# Patient Record
Sex: Female | Born: 1940
Health system: Southern US, Community
[De-identification: ages and names within clinical notes are randomized; demographics above are authoritative.]

## PROBLEM LIST (undated history)

## (undated) ENCOUNTER — Emergency Department (HOSPITAL_BASED_OUTPATIENT_CLINIC_OR_DEPARTMENT_OTHER): Admission: EM | Payer: PPO | Source: Home / Self Care

## (undated) DIAGNOSIS — M8580 Other specified disorders of bone density and structure, unspecified site: Secondary | ICD-10-CM

## (undated) DIAGNOSIS — R600 Localized edema: Secondary | ICD-10-CM

## (undated) DIAGNOSIS — M542 Cervicalgia: Secondary | ICD-10-CM

## (undated) DIAGNOSIS — K7460 Unspecified cirrhosis of liver: Secondary | ICD-10-CM

## (undated) DIAGNOSIS — K7290 Hepatic failure, unspecified without coma: Secondary | ICD-10-CM

## (undated) DIAGNOSIS — T7840XA Allergy, unspecified, initial encounter: Secondary | ICD-10-CM

## (undated) DIAGNOSIS — R7989 Other specified abnormal findings of blood chemistry: Secondary | ICD-10-CM

## (undated) DIAGNOSIS — Z720 Tobacco use: Secondary | ICD-10-CM

## (undated) DIAGNOSIS — N3281 Overactive bladder: Secondary | ICD-10-CM

## (undated) DIAGNOSIS — E782 Mixed hyperlipidemia: Secondary | ICD-10-CM

## (undated) DIAGNOSIS — G629 Polyneuropathy, unspecified: Secondary | ICD-10-CM

## (undated) DIAGNOSIS — E119 Type 2 diabetes mellitus without complications: Secondary | ICD-10-CM

## (undated) DIAGNOSIS — Z794 Long term (current) use of insulin: Secondary | ICD-10-CM

## (undated) DIAGNOSIS — K219 Gastro-esophageal reflux disease without esophagitis: Secondary | ICD-10-CM

## (undated) DIAGNOSIS — H8110 Benign paroxysmal vertigo, unspecified ear: Secondary | ICD-10-CM

## (undated) DIAGNOSIS — F329 Major depressive disorder, single episode, unspecified: Secondary | ICD-10-CM

## (undated) DIAGNOSIS — J441 Chronic obstructive pulmonary disease with (acute) exacerbation: Secondary | ICD-10-CM

## (undated) DIAGNOSIS — I714 Abdominal aortic aneurysm, without rupture: Secondary | ICD-10-CM

## (undated) DIAGNOSIS — L309 Dermatitis, unspecified: Secondary | ICD-10-CM

## (undated) DIAGNOSIS — F419 Anxiety disorder, unspecified: Secondary | ICD-10-CM

## (undated) DIAGNOSIS — F410 Panic disorder [episodic paroxysmal anxiety] without agoraphobia: Secondary | ICD-10-CM

## (undated) DIAGNOSIS — C50919 Malignant neoplasm of unspecified site of unspecified female breast: Secondary | ICD-10-CM

## (undated) DIAGNOSIS — J9601 Acute respiratory failure with hypoxia: Secondary | ICD-10-CM

## (undated) DIAGNOSIS — K7581 Nonalcoholic steatohepatitis (NASH): Secondary | ICD-10-CM

## (undated) DIAGNOSIS — E1165 Type 2 diabetes mellitus with hyperglycemia: Secondary | ICD-10-CM

## (undated) DIAGNOSIS — F32A Depression, unspecified: Secondary | ICD-10-CM

## (undated) DIAGNOSIS — J9610 Chronic respiratory failure, unspecified whether with hypoxia or hypercapnia: Secondary | ICD-10-CM

## (undated) DIAGNOSIS — Z923 Personal history of irradiation: Secondary | ICD-10-CM

## (undated) DIAGNOSIS — M1711 Unilateral primary osteoarthritis, right knee: Secondary | ICD-10-CM

## (undated) DIAGNOSIS — Z0000 Encounter for general adult medical examination without abnormal findings: Principal | ICD-10-CM

## (undated) DIAGNOSIS — W19XXXA Unspecified fall, initial encounter: Secondary | ICD-10-CM

## (undated) DIAGNOSIS — J449 Chronic obstructive pulmonary disease, unspecified: Secondary | ICD-10-CM

## (undated) DIAGNOSIS — E785 Hyperlipidemia, unspecified: Secondary | ICD-10-CM

## (undated) DIAGNOSIS — D696 Thrombocytopenia, unspecified: Secondary | ICD-10-CM

## (undated) DIAGNOSIS — C801 Malignant (primary) neoplasm, unspecified: Secondary | ICD-10-CM

## (undated) DIAGNOSIS — R350 Frequency of micturition: Secondary | ICD-10-CM

## (undated) DIAGNOSIS — M170 Bilateral primary osteoarthritis of knee: Secondary | ICD-10-CM

## (undated) HISTORY — PX: KNEE SURGERY: SHX244

## (undated) HISTORY — DX: Thrombocytopenia, unspecified: D69.6

## (undated) HISTORY — DX: Gastro-esophageal reflux disease without esophagitis: K21.9

## (undated) HISTORY — PX: MANDIBLE FRACTURE SURGERY: SHX706

## (undated) HISTORY — PX: BREAST SURGERY: SHX581

## (undated) HISTORY — DX: Chronic respiratory failure, unspecified whether with hypoxia or hypercapnia: J96.10

## (undated) HISTORY — DX: Malignant neoplasm of unspecified site of unspecified female breast: C50.919

## (undated) HISTORY — DX: Tobacco use: Z72.0

## (undated) HISTORY — PX: CATARACT EXTRACTION: SUR2

## (undated) HISTORY — DX: Type 2 diabetes mellitus with hyperglycemia: Z79.4

## (undated) HISTORY — DX: Cervicalgia: M54.2

## (undated) HISTORY — PX: PILONIDAL CYST EXCISION: SHX744

## (undated) HISTORY — DX: Acute respiratory failure with hypoxia: J96.01

## (undated) HISTORY — DX: Localized edema: R60.0

## (undated) HISTORY — DX: Frequency of micturition: R35.0

## (undated) HISTORY — DX: Abdominal aortic aneurysm, without rupture: I71.4

## (undated) HISTORY — DX: Hyperlipidemia, unspecified: E78.5

## (undated) HISTORY — DX: Chronic obstructive pulmonary disease with (acute) exacerbation: J44.1

## (undated) HISTORY — DX: Unspecified cirrhosis of liver: K74.60

## (undated) HISTORY — DX: Polyneuropathy, unspecified: G62.9

## (undated) HISTORY — DX: Benign paroxysmal vertigo, unspecified ear: H81.10

## (undated) HISTORY — DX: Major depressive disorder, single episode, unspecified: F32.9

## (undated) HISTORY — DX: Allergy, unspecified, initial encounter: T78.40XA

## (undated) HISTORY — DX: Dermatitis, unspecified: L30.9

## (undated) HISTORY — DX: Other specified disorders of bone density and structure, unspecified site: M85.80

## (undated) HISTORY — DX: Overactive bladder: N32.81

## (undated) HISTORY — PX: MASTECTOMY: SHX3

## (undated) HISTORY — DX: Unspecified fall, initial encounter: W19.XXXA

## (undated) HISTORY — DX: Mixed hyperlipidemia: E78.2

## (undated) HISTORY — DX: Encounter for general adult medical examination without abnormal findings: Z00.00

## (undated) HISTORY — DX: Unilateral primary osteoarthritis, right knee: M17.11

## (undated) HISTORY — DX: Anxiety disorder, unspecified: F41.9

## (undated) HISTORY — DX: Bilateral primary osteoarthritis of knee: M17.0

## (undated) HISTORY — DX: Nonalcoholic steatohepatitis (NASH): K75.81

## (undated) HISTORY — DX: Hepatic failure, unspecified without coma: K72.90

## (undated) HISTORY — DX: Long term (current) use of insulin: E11.65

## (undated) HISTORY — DX: Chronic obstructive pulmonary disease, unspecified: J44.9

## (undated) HISTORY — DX: Type 2 diabetes mellitus without complications: E11.9

## (undated) HISTORY — PX: TONSILLECTOMY: SUR1361

## (undated) HISTORY — DX: Other specified abnormal findings of blood chemistry: R79.89

## (undated) HISTORY — DX: Malignant (primary) neoplasm, unspecified: C80.1

---

## 1990-07-27 HISTORY — PX: GALLBLADDER SURGERY: SHX652

## 2005-07-27 HISTORY — PX: APPENDECTOMY: SHX54

## 2007-02-10 ENCOUNTER — Inpatient Hospital Stay (HOSPITAL_COMMUNITY): Admission: EM | Admit: 2007-02-10 | Discharge: 2007-02-13 | Payer: Self-pay | Admitting: Emergency Medicine

## 2007-02-10 ENCOUNTER — Encounter (INDEPENDENT_AMBULATORY_CARE_PROVIDER_SITE_OTHER): Payer: Self-pay | Admitting: Surgery

## 2008-03-07 ENCOUNTER — Ambulatory Visit: Payer: Self-pay | Admitting: Hematology & Oncology

## 2008-03-19 LAB — CBC WITH DIFFERENTIAL (CANCER CENTER ONLY)
BASO#: 0.1 10*3/uL (ref 0.0–0.2)
BASO%: 1.3 % (ref 0.0–2.0)
HCT: 45.5 % (ref 34.8–46.6)
HGB: 16 g/dL — ABNORMAL HIGH (ref 11.6–15.9)
LYMPH#: 2.4 10*3/uL (ref 0.9–3.3)
MONO#: 0.4 10*3/uL (ref 0.1–0.9)
NEUT%: 59.4 % (ref 39.6–80.0)
RBC: 4.97 10*6/uL (ref 3.70–5.32)
RDW: 11.4 % (ref 10.5–14.6)
WBC: 7.6 10*3/uL (ref 3.9–10.0)

## 2008-03-19 LAB — COMPREHENSIVE METABOLIC PANEL
AST: 17 U/L (ref 0–37)
Alkaline Phosphatase: 94 U/L (ref 39–117)
Glucose, Bld: 179 mg/dL — ABNORMAL HIGH (ref 70–99)
Potassium: 4.4 mEq/L (ref 3.5–5.3)
Sodium: 138 mEq/L (ref 135–145)
Total Bilirubin: 0.6 mg/dL (ref 0.3–1.2)
Total Protein: 7.4 g/dL (ref 6.0–8.3)

## 2008-05-11 ENCOUNTER — Encounter: Admission: RE | Admit: 2008-05-11 | Discharge: 2008-05-11 | Payer: Self-pay | Admitting: Family Medicine

## 2008-05-18 ENCOUNTER — Encounter (INDEPENDENT_AMBULATORY_CARE_PROVIDER_SITE_OTHER): Payer: Self-pay | Admitting: Diagnostic Radiology

## 2008-05-18 ENCOUNTER — Encounter: Admission: RE | Admit: 2008-05-18 | Discharge: 2008-05-18 | Payer: Self-pay | Admitting: Family Medicine

## 2008-05-25 ENCOUNTER — Encounter: Admission: RE | Admit: 2008-05-25 | Discharge: 2008-05-25 | Payer: Self-pay | Admitting: Family Medicine

## 2008-06-18 ENCOUNTER — Encounter (INDEPENDENT_AMBULATORY_CARE_PROVIDER_SITE_OTHER): Payer: Self-pay | Admitting: General Surgery

## 2008-06-18 ENCOUNTER — Ambulatory Visit (HOSPITAL_COMMUNITY): Admission: RE | Admit: 2008-06-18 | Discharge: 2008-06-19 | Payer: Self-pay | Admitting: General Surgery

## 2008-07-02 ENCOUNTER — Ambulatory Visit: Payer: Self-pay | Admitting: Hematology & Oncology

## 2008-07-10 LAB — COMPREHENSIVE METABOLIC PANEL
ALT: 19 U/L (ref 0–35)
Albumin: 4.3 g/dL (ref 3.5–5.2)
CO2: 27 mEq/L (ref 19–32)
Chloride: 102 mEq/L (ref 96–112)
Glucose, Bld: 136 mg/dL — ABNORMAL HIGH (ref 70–99)
Potassium: 4.6 mEq/L (ref 3.5–5.3)
Sodium: 137 mEq/L (ref 135–145)
Total Bilirubin: 0.7 mg/dL (ref 0.3–1.2)
Total Protein: 7.2 g/dL (ref 6.0–8.3)

## 2008-07-10 LAB — CBC WITH DIFFERENTIAL (CANCER CENTER ONLY)
BASO%: 1.2 % (ref 0.0–2.0)
LYMPH#: 2.5 10*3/uL (ref 0.9–3.3)
MONO#: 0.4 10*3/uL (ref 0.1–0.9)
NEUT#: 3.9 10*3/uL (ref 1.5–6.5)
Platelets: 181 10*3/uL (ref 145–400)
RDW: 11.2 % (ref 10.5–14.6)
WBC: 7.2 10*3/uL (ref 3.9–10.0)

## 2008-07-10 LAB — CANCER ANTIGEN 27.29: CA 27.29: 51 U/mL — ABNORMAL HIGH (ref 0–39)

## 2008-08-10 LAB — CBC WITH DIFFERENTIAL (CANCER CENTER ONLY)
BASO%: 0.8 % (ref 0.0–2.0)
LYMPH%: 37.8 % (ref 14.0–48.0)
MCH: 32.1 pg (ref 26.0–34.0)
MCV: 93 fL (ref 81–101)
MONO%: 6.1 % (ref 0.0–13.0)
NEUT#: 3.5 10*3/uL (ref 1.5–6.5)
Platelets: 171 10*3/uL (ref 145–400)
RDW: 10.9 % (ref 10.5–14.6)
WBC: 6.7 10*3/uL (ref 3.9–10.0)

## 2008-08-10 LAB — COMPREHENSIVE METABOLIC PANEL
Alkaline Phosphatase: 83 U/L (ref 39–117)
BUN: 13 mg/dL (ref 6–23)
CO2: 25 mEq/L (ref 19–32)
Creatinine, Ser: 0.96 mg/dL (ref 0.40–1.20)
Glucose, Bld: 86 mg/dL (ref 70–99)
Sodium: 139 mEq/L (ref 135–145)
Total Bilirubin: 0.7 mg/dL (ref 0.3–1.2)

## 2008-10-15 ENCOUNTER — Ambulatory Visit: Payer: Self-pay | Admitting: Hematology & Oncology

## 2008-10-17 LAB — CBC WITH DIFFERENTIAL (CANCER CENTER ONLY)
BASO#: 0.1 10*3/uL (ref 0.0–0.2)
EOS%: 3.2 % (ref 0.0–7.0)
Eosinophils Absolute: 0.2 10*3/uL (ref 0.0–0.5)
LYMPH%: 32.8 % (ref 14.0–48.0)
MCH: 31.5 pg (ref 26.0–34.0)
MCHC: 33.2 g/dL (ref 32.0–36.0)
MCV: 95 fL (ref 81–101)
MONO%: 6 % (ref 0.0–13.0)
Platelets: 189 10*3/uL (ref 145–400)
RBC: 4.83 10*6/uL (ref 3.70–5.32)

## 2008-10-17 LAB — COMPREHENSIVE METABOLIC PANEL
ALT: 16 U/L (ref 0–35)
AST: 16 U/L (ref 0–37)
CO2: 28 mEq/L (ref 19–32)
Creatinine, Ser: 0.95 mg/dL (ref 0.40–1.20)
Sodium: 137 mEq/L (ref 135–145)
Total Bilirubin: 0.4 mg/dL (ref 0.3–1.2)
Total Protein: 6.9 g/dL (ref 6.0–8.3)

## 2008-10-17 LAB — CANCER ANTIGEN 27.29: CA 27.29: 48 U/mL — ABNORMAL HIGH (ref 0–39)

## 2008-12-27 ENCOUNTER — Ambulatory Visit: Payer: Self-pay | Admitting: Hematology & Oncology

## 2009-01-11 LAB — CANCER ANTIGEN 27.29: CA 27.29: 45 U/mL — ABNORMAL HIGH (ref 0–39)

## 2009-01-11 LAB — COMPREHENSIVE METABOLIC PANEL
Albumin: 4.4 g/dL (ref 3.5–5.2)
Alkaline Phosphatase: 74 U/L (ref 39–117)
BUN: 13 mg/dL (ref 6–23)
CO2: 22 mEq/L (ref 19–32)
Glucose, Bld: 139 mg/dL — ABNORMAL HIGH (ref 70–99)
Potassium: 4.5 mEq/L (ref 3.5–5.3)
Total Bilirubin: 0.9 mg/dL (ref 0.3–1.2)
Total Protein: 7.6 g/dL (ref 6.0–8.3)

## 2009-01-11 LAB — CBC WITH DIFFERENTIAL (CANCER CENTER ONLY)
BASO%: 0.7 % (ref 0.0–2.0)
EOS%: 2.7 % (ref 0.0–7.0)
LYMPH%: 40 % (ref 14.0–48.0)
MCH: 31.7 pg (ref 26.0–34.0)
MCV: 94 fL (ref 81–101)
MONO%: 6.7 % (ref 0.0–13.0)
Platelets: 160 10*3/uL (ref 145–400)
RDW: 11.3 % (ref 10.5–14.6)

## 2009-04-11 ENCOUNTER — Ambulatory Visit: Payer: Self-pay | Admitting: Hematology & Oncology

## 2009-04-12 LAB — CBC WITH DIFFERENTIAL (CANCER CENTER ONLY)
BASO#: 0.1 10*3/uL (ref 0.0–0.2)
EOS%: 4.2 % (ref 0.0–7.0)
Eosinophils Absolute: 0.3 10*3/uL (ref 0.0–0.5)
HGB: 15.4 g/dL (ref 11.6–15.9)
LYMPH%: 32.9 % (ref 14.0–48.0)
MCH: 32.1 pg (ref 26.0–34.0)
MCHC: 34.5 g/dL (ref 32.0–36.0)
MCV: 93 fL (ref 81–101)
MONO%: 8.8 % (ref 0.0–13.0)
Platelets: 169 10*3/uL (ref 145–400)
RBC: 4.8 10*6/uL (ref 3.70–5.32)

## 2009-04-12 LAB — COMPREHENSIVE METABOLIC PANEL
ALT: 18 U/L (ref 0–35)
Alkaline Phosphatase: 77 U/L (ref 39–117)
Creatinine, Ser: 0.9 mg/dL (ref 0.40–1.20)
Sodium: 138 mEq/L (ref 135–145)
Total Bilirubin: 0.6 mg/dL (ref 0.3–1.2)
Total Protein: 7 g/dL (ref 6.0–8.3)

## 2009-04-18 ENCOUNTER — Ambulatory Visit: Payer: Self-pay | Admitting: Internal Medicine

## 2009-05-02 ENCOUNTER — Encounter: Payer: Self-pay | Admitting: Internal Medicine

## 2009-05-02 ENCOUNTER — Ambulatory Visit: Payer: Self-pay | Admitting: Internal Medicine

## 2009-05-06 ENCOUNTER — Encounter: Payer: Self-pay | Admitting: Internal Medicine

## 2009-05-13 ENCOUNTER — Encounter: Admission: RE | Admit: 2009-05-13 | Discharge: 2009-05-13 | Payer: Self-pay | Admitting: Hematology & Oncology

## 2009-08-09 ENCOUNTER — Ambulatory Visit: Payer: Self-pay | Admitting: Hematology & Oncology

## 2009-10-11 ENCOUNTER — Ambulatory Visit: Payer: Self-pay | Admitting: Hematology & Oncology

## 2009-10-14 LAB — BASIC METABOLIC PANEL
BUN: 14 mg/dL (ref 6–23)
CO2: 28 mEq/L (ref 19–32)
Chloride: 99 mEq/L (ref 96–112)
Glucose, Bld: 159 mg/dL — ABNORMAL HIGH (ref 70–99)
Potassium: 4.9 mEq/L (ref 3.5–5.3)

## 2009-12-10 ENCOUNTER — Ambulatory Visit: Payer: Self-pay | Admitting: Hematology & Oncology

## 2009-12-24 ENCOUNTER — Encounter: Admission: RE | Admit: 2009-12-24 | Discharge: 2010-01-21 | Payer: Self-pay | Admitting: Hematology & Oncology

## 2010-05-14 ENCOUNTER — Encounter: Admission: RE | Admit: 2010-05-14 | Discharge: 2010-05-14 | Payer: Self-pay | Admitting: Hematology & Oncology

## 2010-06-11 ENCOUNTER — Ambulatory Visit: Payer: Self-pay | Admitting: Hematology & Oncology

## 2010-06-13 LAB — CBC WITH DIFFERENTIAL (CANCER CENTER ONLY)
BASO#: 0.3 10*3/uL — ABNORMAL HIGH (ref 0.0–0.2)
EOS%: 2.2 % (ref 0.0–7.0)
Eosinophils Absolute: 0.3 10*3/uL (ref 0.0–0.5)
LYMPH%: 24.1 % (ref 14.0–48.0)
MCH: 31.9 pg (ref 26.0–34.0)
MCHC: 33.6 g/dL (ref 32.0–36.0)
MCV: 95 fL (ref 81–101)
MONO%: 6.9 % (ref 0.0–13.0)
Platelets: 216 10*3/uL (ref 145–400)
RBC: 5.15 10*6/uL (ref 3.70–5.32)

## 2010-06-13 LAB — COMPREHENSIVE METABOLIC PANEL
CO2: 26 mEq/L (ref 19–32)
Creatinine, Ser: 0.93 mg/dL (ref 0.40–1.20)
Glucose, Bld: 178 mg/dL — ABNORMAL HIGH (ref 70–99)
Total Bilirubin: 0.4 mg/dL (ref 0.3–1.2)

## 2010-06-13 LAB — TECHNOLOGIST REVIEW CHCC SATELLITE

## 2010-07-27 LAB — HM COLONOSCOPY

## 2010-10-30 LAB — GLUCOSE, CAPILLARY
Glucose-Capillary: 103 mg/dL — ABNORMAL HIGH (ref 70–99)
Glucose-Capillary: 131 mg/dL — ABNORMAL HIGH (ref 70–99)

## 2010-12-09 NOTE — Consult Note (Signed)
Ashley Savage, Ashley Savage NO.:  [id number]   MEDICAL RECORD NO.:  [id number]          PATIENT TYPE:  EMS   LOCATION:  ED                           FACILITY:  [facility]   PHYSICIAN:  Ardeth Sportsman, MD     DATE OF BIRTH:  1941-03-08   DATE OF CONSULTATION:  02/10/2007  DATE OF DISCHARGE:                                 CONSULTATION   PRIMARY CARE PHYSICIAN:  Does not have.   REFERRING PHYSICIAN:  Dr. Benjiman Core, ER.   SURGEON:  Dr. Estelle Grumbles.   REASON FOR CONSULT:  Probable appendicitis.   HISTORY OF PRESENT ILLNESS:  Ashley Savage is a 70 year old female who  normally lives in [place] and is currently visiting her sister on  vacation.  She had a 5 day history of abdominal pain.  It's been  primarily in her lower abdomen.  She felt some pressure and discomfort  with urination.  [facility], and it was felt that  perhaps she had a bladder infection, and so she was started on some  ciprofloxacin.  X-rays showed some dilated loops of bowel.  She is  normally on Metformin for blood sugars, and they have been under decent  control.  She had some nausea and vomiting on the first day and then  seemed to improve over the next few days.  However, in the past, about  36 hours, she has had worsening abdominal pain.  Seems more focused in  right lower quadrant now.  She has had nausea but has not thrown up.  She has had decreased appetite.  She normally has a bowel movement about  once or 2 times a day.  She had had some loose stools earlier in the  week, but that seems to have backed off.  She denies any sick contacts.  She has not lived outside the country recently.  Her sister is otherwise  healthy.  She has never had anything like this before.  She had a  colonoscopy 2 years ago that is otherwise completely negative.   PAST MEDICAL HISTORY:  1. COPD, on an inhaler not on any O2.  2. Tobacco abuse.  3. Diabetes.   PAST SURGICAL HISTORY:  1. She  has had a laparoscopic cholecystectomy.  2. She has had knee surgery.  3. She has had jaw surgery.   ALLERGIES:  None.   MEDICATIONS:  1. She takes Spiriva with a hand-held inhaler.  2. She takes Metformin 500 b.i.d.  3. Dicyclomine.  4. She has recently been on Cipro 500 mg p.o. b.i.d.   ALLERGIES:  Known allergies.  SHE DOES HAVE SOME ABDOMINAL PAIN AND  DIARRHEA WITH ERYTHROMYCINS DISCOMFORT BUT NO RASH.   SOCIAL HISTORY:  She normally lives in [place].  She is here visiting  her sister on vacation.  She has probably about a 50 pack year history  of tobacco and smokes 1 pack-per-day.  She denies drinking any alcohol  and denies any other drug use.  She is recently retired.   FAMILY HISTORY:  Negative for any major  gastrointestinal or disorders.  Her mother did have some issues of chronic constipation with impacted  bowel.  No history of colon cancer, stomach cancer, inflammatory bowel  disease.   REVIEW OF SYSTEMS:  Known per HPI.  Otherwise, GENERAL:  She has been  having some fever chills.  No recent change in her weight.  OPHTHALMALGIA:  Otherwise negative.  ENT:  Negative.  RESPIRATORY:  She  does not need oxygen a home, and she has pretty good exercise tolerance.  No recent colds, coughs, flues, productive sputum.  CARDIOVASCULAR:  No  exertional chest pain or shortness of breath or dyspnea on exertion nor  orthopnea, PND.  GI:  As noted above.  No hematochezia or melena.  No  hematemesis.  GYN/NEUROLOGIC:  Negative.  MUSCULOSKELETAL:  Some mile  knee discomfort arthritis but this has been stable, not recently  changed.  No recent back pain.  PSYCHIATRIC:  He limps.  ALLERGIC:  Otherwise negative.   VITAL SIGNS:  Temperature of 101.4, pulse 98, respirations 18, blood  pressure 117/70, 10/10 pain, 96% saturation on 2 L nasal cannula.   PHYSICAL EXAMINATION:  GENERAL:  She is a well-developed, well-nourished  overweight female obviously uncomfortable, lying still but  not frankly  toxic.  PHYSIC:  She is post neuroactive with at least average intelligence and  good insight.  No evidence of any dementia, delirium, psychosis,  paranoia.  EYES:  Pupils equal, round, and reactive to light.  Extraocular  movements intact.  Sclerae nonicteric or injected.  HEENT:  She is normocephalic.  Mucous membranes are moist.  Nasopharynx/oropharynx are clear, and there is no facial asymmetry.  NECK:  Supple without any masses.  Trachea is in midline.  HEART:  Regular rate and rhythm with no murmurs, clicks, or rubs.  CHEST:  Clear to auscultation bilaterally except for some end expiratory  wheezing, left greater than right that is mild.  VASCULAR:  No carotid bruits, normal radius and dorsalis pedis pulses.  ABDOMEN:  Soft and flat.  She has well-healed incisions with no evidence  of any laparoscopic incisional hernias or umbilical hernias.  She is  exquisitely tender in her right lower quadrant with voluntary guarding  and pain with cough and percussion and bed shake consistent with  peritonitis.  She has some suprapubic iscomfort.  Her upper abdomen and  the left upper quadrant is not tender.  GU:  Normal female genitalia.  Rectal deferred per patient request.  EXTREMITIES:  No cyanosis, clubbing, or edema.  MUSCULOSKELETAL:  Full range of motion shoulders, elbows, wrists, knees  & ankles.  NEUROLOGIC:  Cranial nerves 2-12 intact.  Hand grip is 5/5 equal and  symmetrical with no resting or attention tremors.  LYMPH:  No head, neck axillary, groin, no lymphadenopathy.  SKIN:  No oblivious petechia preferred, no other source of lesions.   Labwork shows an elevated white continue of 16.4, the hemoglobin 16.7,  potassium 4.3, creatinine of 1.01.  Studies show EKG which shows no  significant ST or T wave changes.  Chest x-ray is pending.  CT scan of  the abdomen and pelvis with no contrast shows no evidence of bowel  obstruction or free air.  She may have some fluid  in her right pericolic  gutter going down a bit to her pelvis was not near a loop of small  intestine.  She does have a blind-ended loop, leaving her cecum with a  couple of calcified stones and some inflammatory changes consistent with  appendicitis with fecalith.  I do not see any free air or dots of air  around that area.  There is no evidence for any kidney stones.   ASSESSMENT/PLAN:  A 70 year old female with history, physical, and  CT  scan concerning for acute appendicitis.  1. Admit.  2. IV antibiotics.  3. Diagnostic laparoscopy with appendectomy possible conversion to      open.  4. Anatomy and physiology of the digestive tract was discussed.      Pathophysiology of appendicitis was explained.  Technique and      laparoscopic diagnostic laparoscopy with appendectomy was      explained.  Risks such as stroke, MI, DVT, pulmonary embolism and      death were discussed.  Risks such as bleeding, need for transfusion      wound infection, abscess, injury to other organs, prolonged pain,      incisional hernia, anostotic weak fistula, other diagnoses and      other risks were discussed.  Questions are answered, and she agrees      to proceed.  We will do this emergently.      Ardeth Sportsman, MD  Electronically Signed     SCG/MEDQ  D:  02/10/2007  T:  02/11/2007  Job:  [id number]

## 2010-12-09 NOTE — Op Note (Signed)
Ashley Savage, Ashley Savage NO.:  [id number]   MEDICAL RECORD NO.:  [id number]          PATIENT TYPE:  EMS   LOCATION:  ED                           FACILITY:  [facility]   PHYSICIAN:  Ardeth Sportsman, MD     DATE OF BIRTH:  1941-05-11   DATE OF PROCEDURE:  02/10/2007  DATE OF DISCHARGE:                               OPERATIVE REPORT   PRIMARY CARE PHYSICIAN:  In [place]   SURGEON:  Ardeth Sportsman, M.D.   ASSISTANT:  None.   PREOPERATIVE DIAGNOSES:  Acute appendicitis.   POSTOPERATIVE DIAGNOSES:  Gangrenous appendicitis with peritonitis and  intra-abdominal adhesions.   PROCEDURES PERFORMED:  1. Diagnostic laparoscopy.  2. Laparoscopic lysis of adhesions times 30 minutes (equals half the      case).  3. Laparoscopic appendectomy.   ANESTHESIA:  1. General anesthesia.  2. Local anesthetic as a field block around all port sites.   SPECIMENS:  Appendix.   DRAINS:  #19 [place] drain, rests in the right paracolic gutter,  down posterior to the proximal ascending colon and cecum and then the  tip goes down into the rectovaginal vault of the pelvis.   ESTIMATED BLOOD LOSS:  Less than 10 mL.   COMPLICATIONS:  None apparent.   INDICATIONS:  Ms. Kakos is a 70 year old female from [place], who is  visiting her [place].  She has had worsening abdominal pain  with nausea over the past few days with history, physical and  radiographic findings strongly concerning for appendicitis.  Options  were discussed and recommendation was made for diagnostic laparoscopy  with appendectomy, possible conversion to open.   Risks, such as stroke, heart attack, deep venous thrombosis, pulmonary  embolism and death, were discussed.  Risks, such as bleeding, need for  transfusion, wound infection, abscess, injury to other organs,  incisional hernia, fistula, prolonged pain, other diagnoses, etc., were  discussed.  Questions were answered and she agreed to proceed.   OPERATIVE FINDINGS:  She had at least two fecaliths.  They were  perforated into the peritoneal cavity.  I would say at least two thirds  of her appendix was gangrenous, although the base of the appendix  appeared to be viable.  She had peritonitis with fluid collections in  her pelvis.  Left lower quadrant and left upper quadrant and right upper  quadrant were simply purulent.  She had some dense omental adhesions of  her ileal mesentery.  She had omental adhesions in her right upper  quadrant, probably from her prior laparoscopic cholecystectomy.   DESCRIPTION OF PROCEDURE:  Informed consent was confirmed.  Patient  received IV Unasyn in the emergency room.  She had sequential  compression devices active during the entire case.  She underwent  general anesthesia without difficulty.  She had a Foley catheter  sterilely placed.  She was placed supine, both arms tucked.  Her abdomen  was prepped and draped in sterile fashion.   Entry was gained into the abdomen, using optimal entry with a 5 mm 0  degree scope with the patient in  steep reverse Trendelenburg and right  side up.  Capnoperitoneum to 15 mmHg provided good abdominal  insufflation.  Under direct visualization, a 5 mm port was placed to the  inferior part of the umbilicus and a 12 mm port was placed in the left  suprapubic region.   Camera inspection revealed findings noted above with peritonitis and  thickened loops.  Small bowel was run and a couple of thick loops were  down in the pelvis with obvious feculopurulent contamination down in her  pelvis.  I ultimately was able to reduce those loops out of the pelvis  and release some inter-loop adhesions.  The cecum could be found.  I  found the base of the appendix and, after careful, controlled  dissection, was able to free the appendix up circumferentially and  elevate it anteriorly.  The appendiceal mesentery was ligated using  controlled harmonic scalpel to the base.   Appendix was transected, using  laparoscopic 45 mm reticulating Ethicon stapler, taking a healthy cuff  of cecum.  The appendix was too large to be removed out the port and  therefore was placed into an EndoCatch bag and removed.  The fascial  defect in the left suprapubic port area was approximated using a 0  Vicryl, using laparoscopic fascial closure device.   Pus was aspirated in all areas of the abdomen.  Copious irrigation over  10 L of sterile solution was done to much more clear return.  She had  adhesions of omentum in the right upper quadrant and these were freed  off laparoscopically, using a Harmonic scalpel and blunt dissection to  help allow the greater omentum to fall down to the right lower quadrant  and help patch the area up.  A #19 [place] drain was placed in the  abdomen, such that the tip rested in the pelvis and ran up along over  the right pelvic rim and the right paracolic gutter.  It was brought out  the right upper quadrant 5 mm port incision.  It was secured to the skin  using a 2-0 nylon stitch.  Careful inspection revealed no evidence of  any active bleeding and no evidence of any injury to the small bowel.  Staple line was intact on the cecum.  Two of the three abdominal ports  were removed.  Capnoperitoneum was evacuated.  Final port was removed.  Fascial stitch was tied down on the suprapubic area.  Wounds were  irrigated copiously with saline and closed with 4-0 Monocryl stitch.  Sterile dressing was applied.  Patient was extubated and sent to the  recovery room in stable condition.   I have explained the operative findings to the patient's sister and  niece.  Questions were answered and they expressed understanding and  appreciation.      Ardeth Sportsman, MD  Electronically Signed     SCG/MEDQ  D:  02/10/2007  T:  02/11/2007  Job:  [id number]

## 2010-12-09 NOTE — Op Note (Signed)
Ashley Savage, Ashley Savage                 ACCOUNT NO.:  [id number]   MEDICAL RECORD NO.:  [id number]          PATIENT TYPE:  OIB   LOCATION:  5152                         FACILITY:  [facility]   PHYSICIAN:  Ollen Gross. Vernell Morgans, M.D. DATE OF BIRTH:  08-03-1940   DATE OF PROCEDURE:  06/18/2008  DATE OF DISCHARGE:                               OPERATIVE REPORT   PREOPERATIVE DIAGNOSIS:  Recurrent right breast cancer.   POSTOPERATIVE DIAGNOSIS:  Recurrent right breast cancer.   PROCEDURE:  Right mastectomy and sentinel node biopsy with injection of  blue dye.   SURGEON:  Ollen Gross. Vernell Morgans, MD   ASSISTANT:  Lorne Skeens. Hoxworth, MD   ANESTHESIA:  General via LMA.   PROCEDURE:  After informed consent was obtained, the patient was brought  to the operating room and placed in supine position on the operating  table.  After adequate induction of general anesthesia, the patient's  right breast, axilla, and chest were prepped with Betadine and draped in  usual sterile manner.  Earlier in the day, the patient undergone  injection of 1 mCi of technetium sulfur colloid in the subareolar  position.  At this point, 2 mL of methylene blue and 3 mL of injectable  saline were also injected in the subareolar position.  The breast was  massaged for several minutes.  The NeoProbe was used to identify a hot  spot in the right axilla.  An elliptical type incision was made  horizontally across the breast to include the nipple and areolar  complex.  This incision was carried down through the skin and  subcutaneous tissue sharply with the electrocautery.  Skin hooks were  then used to elevate the skin flaps towards the ceiling, thin skin flaps  were then created by dissection between the breast tissue and the  subcutaneous fatty tissue plane with gentle traction on the breast.  This dissection was carried through this tissue plane circumferentially  until the dissection reached the chest wall in the right axilla.   Once  the dissection had been carried into the axilla, the NeoProbe was then  again used to identify the single hot spot.  This area was excised  sharply with the electrocautery.  Ex vivo counts of this lymph node were  approximately 450.  It was sent to Pathology as sentinel node #1.  Touch  preps on this node were negative for any tumor cells.  No other areas of  blue dye or increased radioactivity were identified laterally.  The  dissection plane was carried down to the latissimus muscle.  At this  point, the breast was then removed from the chest wall with the  pectoralis fascia.  This was also done sharply with the electrocautery.  Once this was accomplished, the breast was then removed from the  patient.  It was oriented with a stitch on the lateral aspect and sent  to Pathology for further evaluation.  Hemostasis was achieved using the  Bovie electrocautery.  The wound was irrigated with copious amounts of  saline.  A small stab incision was made along the  mid axillary line  below the wound with a 15-blade knife.  A tonsil clamp was then placed.  That was then brought through this opening into the wound and used to  grasp the end of a 19-French round Blake drain.  The drain was then  brought through this opening, the drain was laid along the chest wall.  The drain was anchored to the skin with 3-0 nylon stitch.  The  subcutaneous tissue of the superior and inferior skin flap were then  grossly reapproximated with interrupted 3-0 Vicryl stitches and then the  skin was closed with staples.  The  drain was placed to bulb suction and there was a very good seal.  Xeroform gauze was placed over the staple line along with sterile  dressings.  The patient tolerated the procedure well.  At the end of  case; all needle, sponge, and instrument counts were correct.  The  patient was then awakened and taken to recovery room in stable  condition.      Ollen Gross. Vernell Morgans, M.D.  Electronically  Signed     PST/MEDQ  D:  06/18/2008  T:  06/18/2008  Job:  [id number]

## 2010-12-12 NOTE — Discharge Summary (Signed)
Ashley Savage, PACK NO.:  [id number]   MEDICAL RECORD NO.:  [id number]          PATIENT TYPE:  INP   LOCATION:  1540                         [facility]ACILITY:  [facility]   PHYSICIAN:  Ardeth Sportsman, MD     DATE OF BIRTH:  Jul 27, 1941   DATE OF ADMISSION:  02/10/2007  DATE OF DISCHARGE:  02/13/2007                               DISCHARGE SUMMARY   PRIMARY CARE PHYSICIAN:  She actually does not have a primary care  physician.   ER PHYSICIAN:  Billee Cashing, MD   SURGEON:  Ardeth Sportsman, MD   DIAGNOSES:  Acute gangrenous appendicitis with peritonitis.   PROCEDURE PERFORMED:  Laparoscopic lysis of adhesions and appendectomy  on 02/10/2007.   OTHER DIAGNOSES:  1. Chronic obstructive pulmonary disease, on inhalers.  2. Tobacco abuse.  3. Diabetes.  4. Status post laparoscopic cholecystectomy.  5. Status post knee surgery.  6. Status post jaw surgery.   DISCHARGE MEDICATIONS:  1. Spiriva with hand held inhaler.  2. Metformin 500 mg by mouth b.i.d..  3. Dicyclomine.  4. Augmentin 875 mg by mouth b.i.d. x 10 days.  5. Percocet 1-2 by mouth 4 hours as needed for pain.  6. Ibuprofen as needed for pain.  7. Tylenol as needed for pain.   HOSPITAL COURSE:  [name] is 70 year old female who was visiting her  sister from [place] with a 5 day history of worsening abdominal pain.  She had evidence of appendicitis.  She was noted to have gangrenous  appendicitis that was ultimately able to be treated surgically with  laparoscopic lysis of adhesions.  She was placed on IV antibiotic and IV  fluids.  She defervesced and had decreased her white count into more  normal range.  By the time of postop day 3 she was tolerating a solid  diet, walking rather well.  Her drainage output had decreased to more  serosanguineous level.   DISCHARGE INSTRUCTIONS:  Based on these improvements we thought it  should be reasonable for her to be discharged home with following  instructions:  1. She is to return to clinic to see a physician in the next 1-2      weeks.  She wishes to follow up in [place]. I think that is      reasonable.  2. She should have her drain discontinued when possible.  3. She should take Augmentin 875 mg by mouth b.i.d. for 10 days to      help decrease chance of abscess.  4. She should call if she has any fever, chills or sweats, worsening      nausea, vomiting, abdominal pain or other concerns.      Ardeth Sportsman, MD  Electronically Signed     SCG/MEDQ  D:  03/01/2007  T:  03/01/2007  Job:  [id number]

## 2011-01-15 ENCOUNTER — Encounter (HOSPITAL_BASED_OUTPATIENT_CLINIC_OR_DEPARTMENT_OTHER): Payer: PRIVATE HEALTH INSURANCE | Admitting: Hematology & Oncology

## 2011-01-15 ENCOUNTER — Emergency Department (INDEPENDENT_AMBULATORY_CARE_PROVIDER_SITE_OTHER): Payer: PRIVATE HEALTH INSURANCE

## 2011-01-15 ENCOUNTER — Emergency Department (HOSPITAL_BASED_OUTPATIENT_CLINIC_OR_DEPARTMENT_OTHER)
Admission: EM | Admit: 2011-01-15 | Discharge: 2011-01-15 | Disposition: A | Discharge status: Home / Self Care | Payer: PRIVATE HEALTH INSURANCE | Attending: Emergency Medicine | Admitting: Emergency Medicine

## 2011-01-15 ENCOUNTER — Other Ambulatory Visit: Payer: Self-pay | Admitting: Family

## 2011-01-15 DIAGNOSIS — E119 Type 2 diabetes mellitus without complications: Secondary | ICD-10-CM | POA: Insufficient documentation

## 2011-01-15 DIAGNOSIS — Z853 Personal history of malignant neoplasm of breast: Secondary | ICD-10-CM

## 2011-01-15 DIAGNOSIS — Z79899 Other long term (current) drug therapy: Secondary | ICD-10-CM | POA: Insufficient documentation

## 2011-01-15 DIAGNOSIS — J438 Other emphysema: Secondary | ICD-10-CM

## 2011-01-15 DIAGNOSIS — R0602 Shortness of breath: Secondary | ICD-10-CM

## 2011-01-15 DIAGNOSIS — M25519 Pain in unspecified shoulder: Secondary | ICD-10-CM

## 2011-01-15 DIAGNOSIS — M542 Cervicalgia: Secondary | ICD-10-CM

## 2011-01-15 DIAGNOSIS — C50419 Malignant neoplasm of upper-outer quadrant of unspecified female breast: Secondary | ICD-10-CM

## 2011-01-15 DIAGNOSIS — K219 Gastro-esophageal reflux disease without esophagitis: Secondary | ICD-10-CM | POA: Insufficient documentation

## 2011-01-15 DIAGNOSIS — Z170 Estrogen receptor positive status [ER+]: Secondary | ICD-10-CM

## 2011-01-15 DIAGNOSIS — E785 Hyperlipidemia, unspecified: Secondary | ICD-10-CM | POA: Insufficient documentation

## 2011-01-15 DIAGNOSIS — F17200 Nicotine dependence, unspecified, uncomplicated: Secondary | ICD-10-CM

## 2011-01-15 DIAGNOSIS — J4489 Other specified chronic obstructive pulmonary disease: Secondary | ICD-10-CM | POA: Insufficient documentation

## 2011-01-15 DIAGNOSIS — J449 Chronic obstructive pulmonary disease, unspecified: Secondary | ICD-10-CM | POA: Insufficient documentation

## 2011-01-15 LAB — COMPREHENSIVE METABOLIC PANEL
ALT: 22 U/L (ref 0–35)
AST: 31 U/L (ref 0–37)
Albumin: 4.2 g/dL (ref 3.5–5.2)
Calcium: 9.5 mg/dL (ref 8.4–10.5)
Sodium: 137 mEq/L (ref 135–145)
Total Protein: 7.3 g/dL (ref 6.0–8.3)

## 2011-01-15 LAB — CBC WITH DIFFERENTIAL (CANCER CENTER ONLY)
BASO#: 0 10*3/uL (ref 0.0–0.2)
Eosinophils Absolute: 0.2 10*3/uL (ref 0.0–0.5)
HGB: 15.2 g/dL (ref 11.6–15.9)
LYMPH#: 2.4 10*3/uL (ref 0.9–3.3)
MONO#: 0.6 10*3/uL (ref 0.1–0.9)
NEUT#: 3.3 10*3/uL (ref 1.5–6.5)
RBC: 4.72 10*6/uL (ref 3.70–5.32)

## 2011-01-15 LAB — DIFFERENTIAL
Basophils Absolute: 0.1 10*3/uL (ref 0.0–0.1)
Lymphocytes Relative: 38 % (ref 12–46)
Monocytes Absolute: 0.7 10*3/uL (ref 0.1–1.0)
Monocytes Relative: 10 % (ref 3–12)
Neutro Abs: 3.1 10*3/uL (ref 1.7–7.7)

## 2011-01-15 LAB — CK TOTAL AND CKMB (NOT AT ARMC): CK, MB: 2.4 ng/mL (ref 0.3–4.0)

## 2011-01-15 LAB — CBC
HCT: 44.4 % (ref 36.0–46.0)
Hemoglobin: 15.8 g/dL — ABNORMAL HIGH (ref 12.0–15.0)
MCH: 31.3 pg (ref 26.0–34.0)
MCHC: 35.6 g/dL (ref 30.0–36.0)

## 2011-01-15 MED ORDER — IOHEXOL 350 MG/ML SOLN
80.0000 mL | Freq: Once | INTRAVENOUS | Status: AC | PRN
  Administered 2011-01-15: 80 mL via INTRAVENOUS

## 2011-02-18 ENCOUNTER — Encounter (HOSPITAL_BASED_OUTPATIENT_CLINIC_OR_DEPARTMENT_OTHER): Payer: PRIVATE HEALTH INSURANCE | Admitting: Hematology & Oncology

## 2011-02-18 ENCOUNTER — Other Ambulatory Visit: Payer: Self-pay | Admitting: Hematology & Oncology

## 2011-02-18 ENCOUNTER — Ambulatory Visit (HOSPITAL_BASED_OUTPATIENT_CLINIC_OR_DEPARTMENT_OTHER)
Admission: RE | Admit: 2011-02-18 | Discharge: 2011-02-18 | Disposition: A | Discharge status: Home / Self Care | Payer: PRIVATE HEALTH INSURANCE | Source: Ambulatory Visit | Attending: Family | Admitting: Family

## 2011-02-18 ENCOUNTER — Other Ambulatory Visit: Payer: Self-pay | Admitting: Family

## 2011-02-18 DIAGNOSIS — M25519 Pain in unspecified shoulder: Secondary | ICD-10-CM | POA: Insufficient documentation

## 2011-02-18 DIAGNOSIS — F17200 Nicotine dependence, unspecified, uncomplicated: Secondary | ICD-10-CM

## 2011-02-18 DIAGNOSIS — M25512 Pain in left shoulder: Secondary | ICD-10-CM

## 2011-02-18 DIAGNOSIS — Z853 Personal history of malignant neoplasm of breast: Secondary | ICD-10-CM

## 2011-02-18 DIAGNOSIS — Z9011 Acquired absence of right breast and nipple: Secondary | ICD-10-CM

## 2011-02-18 DIAGNOSIS — M542 Cervicalgia: Secondary | ICD-10-CM

## 2011-02-26 ENCOUNTER — Ambulatory Visit (INDEPENDENT_AMBULATORY_CARE_PROVIDER_SITE_OTHER): Payer: PRIVATE HEALTH INSURANCE | Admitting: Critical Care Medicine

## 2011-02-26 ENCOUNTER — Encounter: Payer: Self-pay | Admitting: Critical Care Medicine

## 2011-02-26 DIAGNOSIS — J449 Chronic obstructive pulmonary disease, unspecified: Secondary | ICD-10-CM | POA: Insufficient documentation

## 2011-02-26 DIAGNOSIS — E119 Type 2 diabetes mellitus without complications: Secondary | ICD-10-CM

## 2011-02-26 DIAGNOSIS — J438 Other emphysema: Secondary | ICD-10-CM

## 2011-02-26 DIAGNOSIS — E785 Hyperlipidemia, unspecified: Secondary | ICD-10-CM

## 2011-02-26 DIAGNOSIS — Z720 Tobacco use: Secondary | ICD-10-CM

## 2011-02-26 DIAGNOSIS — C50919 Malignant neoplasm of unspecified site of unspecified female breast: Secondary | ICD-10-CM | POA: Insufficient documentation

## 2011-02-26 DIAGNOSIS — E782 Mixed hyperlipidemia: Secondary | ICD-10-CM | POA: Insufficient documentation

## 2011-02-26 DIAGNOSIS — C801 Malignant (primary) neoplasm, unspecified: Secondary | ICD-10-CM

## 2011-02-26 DIAGNOSIS — F17200 Nicotine dependence, unspecified, uncomplicated: Secondary | ICD-10-CM

## 2011-02-26 MED ORDER — FLUTICASONE-SALMETEROL 250-50 MCG/DOSE IN AEPB: 1.0000 | INHALATION_SPRAY | Freq: Two times a day (BID) | RESPIRATORY_TRACT | Status: DC

## 2011-02-26 MED ORDER — ALBUTEROL SULFATE (2.5 MG/3ML) 0.083% IN NEBU: 2.5000 mg | INHALATION_SOLUTION | Freq: Four times a day (QID) | RESPIRATORY_TRACT | Status: DC | PRN

## 2011-02-26 MED ORDER — NICOTINE 10 MG IN INHA: RESPIRATORY_TRACT | Status: DC

## 2011-02-26 NOTE — Patient Instructions (Signed)
PUlmonary function tests to be obtained Stop smoking, use nicotrol Start Advair 250 one puff twice daily Stay on Spiriva Return 6 weeks, I will call with results of breathing test

## 2011-02-26 NOTE — Progress Notes (Signed)
Subjective:    [name], female    DOB: 11-30-1940, 70 y.o.   MRN: [id number]  Shortness of Breath This is a chronic problem. The current episode started more than 1 year ago. Episode frequency: Dyspnea is with exertion only. The problem has been gradually worsening (tends to have two to three exac per year). Duration: recovers in . Associated symptoms include PND, sputum production and wheezing. Pertinent negatives include no abdominal pain, chest pain, ear pain, fever, headaches, hemoptysis, leg swelling, neck pain, orthopnea, rash, rhinorrhea, sore throat or vomiting. The symptoms are aggravated by any activity, emotional upset, URIs, eating, exercise, fumes and lying flat. She has tried beta agonist inhalers, ipratropium inhalers and oral steroids for the symptoms. The treatment provided moderate (spiriva not holding over the full 24hrs) relief. Her past medical history is significant for COPD. There is no history of allergies, aspirin allergies, asthma, bronchiolitis, CAD, DVT, a heart failure, PE or pneumonia.  Cough This is a chronic problem. The current episode started more than 1 year ago. The problem has been gradually worsening. The cough is productive of sputum (clear to thin mucus to milky, is coughing harder than before). Associated symptoms include chills, myalgias, nasal congestion, postnasal drip, shortness of breath and wheezing. Pertinent negatives include no chest pain, ear pain, fever, headaches, heartburn, hemoptysis, rash, rhinorrhea or sore throat. The symptoms are aggravated by cold air (worse in the summer with humid air). She has tried a beta-agonist inhaler, ipratropium inhaler and oral steroids for the symptoms. The treatment provided moderate relief. Her past medical history is significant for bronchitis, COPD and emphysema. There is no history of asthma, bronchiectasis, environmental allergies or pneumonia.   70 y.o. F referred for Copd eval. Moved from  [facility] 2008, saw Lung MD there.  No lung MD has yet seen the pt. Has been on same meds as before.  All family is in [place] Dx Copd many years.(2004) Still smokes 1PPDx26yrs Side effects with smoking cessation tools:  Patches: nightmares,  Nicotrol?helps,  chantix nausea  Past Medical History  Diagnosis Date  . Emphysema   . Diabetes mellitus type 2  . Hyperlipidemia   . Cancer breast ca  right     Family History  Problem Relation Age of Onset  . Heart failure Father   . Pneumonia Sister   . COPD Father   . Breast cancer    . Stroke Mother      History   Social History  . Marital Status: Single    Spouse Name: N/A    Number of Children: 0  . Years of Education: N/A   Occupational History  . retire    Social History Main Topics  . Smoking status: Current Everyday Smoker -- 1.0 packs/day for 54 years  . Smokeless tobacco: Never Used  . Alcohol Use:   . Drug Use:   . Sexually Active:    Other Topics Concern  . Not on file   Social History Narrative  . No narrative on file     Allergies  Allergen Reactions  . Erythromycin     Stomach cramps     No outpatient prescriptions prior to visit.     Review of Systems  Constitutional: Positive for chills and fatigue. Negative for fever, diaphoresis, activity change, appetite change and unexpected weight change.  HENT: Positive for congestion, sneezing, postnasal drip and tinnitus. Negative for hearing loss, ear pain, nosebleeds, sore throat, facial swelling, rhinorrhea, mouth sores, trouble swallowing,  neck pain, neck stiffness, dental problem, voice change, sinus pressure and ear discharge.   Eyes: Negative for photophobia, discharge, itching and visual disturbance.  Respiratory: Positive for cough, sputum production, shortness of breath and wheezing. Negative for apnea, hemoptysis, choking, chest tightness and stridor.   Cardiovascular: Positive for PND. Negative for chest pain, palpitations, orthopnea and leg  swelling.  Gastrointestinal: Positive for constipation. Negative for heartburn, nausea, vomiting, abdominal pain, blood in stool and abdominal distention.  Genitourinary: Positive for decreased urine volume and difficulty urinating. Negative for dysuria, urgency, frequency, hematuria and flank pain.  Musculoskeletal: Positive for myalgias, arthralgias and gait problem. Negative for back pain and joint swelling.  Skin: Negative for color change, pallor and rash.  Neurological: Positive for light-headedness and numbness. Negative for dizziness, tremors, seizures, syncope, speech difficulty, weakness and headaches.  Hematological: Negative for environmental allergies and adenopathy. Does not bruise/bleed easily.  Psychiatric/Behavioral: Positive for sleep disturbance. Negative for confusion and agitation. The patient is nervous/anxious.        Objective:   Physical Exam Filed Vitals:   02/26/11 1459  BP: 92/54  Pulse: 94  Temp: 94 F (34.4 C)  TempSrc: Oral  Height: 5\' 6"  (1.676 m)  Weight: 186 lb (84.369 kg)  SpO2: 94%    Gen: Pleasant, well-nourished, in no distress,  normal affect  ENT: No lesions,  mouth clear,  oropharynx clear, no postnasal drip  Neck: No JVD, no TMG, no carotid bruits  Lungs: No use of accessory muscles, no dullness to percussion,exp wheezes, poor airflow  Cardiovascular: RRR, heart sounds normal, no murmur or gallops, no peripheral edema  Abdomen: soft and NT, no HSM,  BS normal  Musculoskeletal: No deformities, no cyanosis or clubbing  Neuro: alert, non focal  Skin: Warm, no lesions or rashes   CT Chest 01/15/11 Findings: There is centrilobular emphysema, mild. Both lungs are  clear. There are no osseous lesions. The right breast is absent.  There are no filling defects within either pulmonary arterial  system to suggest a segmental or subsegmental pulmonary embolus.  There is no axillary, hilar, mediastinal adenopathy. The thoracic  aorta has a  normal caliber and appearance. The visualized upper  abdomen is unremarkable except for heterogeneous enhancement of the  spleen which may be related to phase of contrast versus cavernous  hemangioma.  Review of the MIP images confirms the above findings.  IMPRESSION:  Normal CT scan of the chest.      Assessment & Plan:   Emphysema Severe obstructive lung disease, ongoing tobacco abuse  plan Check PUlmonary function tests to be obtained Stop smoking, use nicotrol Start Advair 250 one puff twice daily Stay on Spiriva Return 6 weeks     Updated Medication List Outpatient Encounter Prescriptions as of 02/26/2011  Medication Sig Dispense Refill  . albuterol (VENTOLIN HFA) 108 (90 BASE) MCG/ACT inhaler Inhale 2 puffs into the lungs every 6 (six) hours as needed. As needed       . aspirin 81 MG tablet Take 81 mg by mouth daily.        . diazepam (VALIUM) 5 MG tablet Take 5 mg by mouth. 1 daily as needed       . metFORMIN (GLUCOPHAGE) 1000 MG tablet Take 1,000 mg by mouth 2 (two) times daily with a meal. Take 1 every am and 1/2 every evening       . Naproxen Sodium (ALEVE) 220 MG CAPS Take by mouth. As needed       .  omeprazole (PRILOSEC OTC) 20 MG tablet Take 20 mg by mouth daily.        Marland Kitchen tiotropium (SPIRIVA HANDIHALER) 18 MCG inhalation capsule Place 18 mcg into inhaler and inhale daily.        Marland Kitchen albuterol (PROVENTIL) (2.5 MG/3ML) 0.083% nebulizer solution Take 2.5 mg by nebulization 4 (four) times daily as needed.  360 mL  6  . Fluticasone-Salmeterol (ADVAIR) 250-50 MCG/DOSE AEPB Inhale 1 puff into the lungs 2 (two) times daily.  1 each  6  . letrozole (FEMARA) 2.5 MG tablet Take 2.5 tablets by mouth Daily.      Marland Kitchen lovastatin (MEVACOR) 20 MG tablet 20 mg 1 by mouth daily      . nicotine (NICOTROL) 10 MG inhaler Use 60-80 puffs per cartridge  Use 6-8 cartridges per day  168 each  6  . DISCONTD: albuterol (PROVENTIL) (2.5 MG/3ML) 0.083% nebulizer solution In neb tiwce a day

## 2011-02-27 NOTE — Assessment & Plan Note (Addendum)
Severe obstructive lung disease, ongoing tobacco abuse  plan Check PUlmonary function tests to be obtained Stop smoking, use nicotrol Start Advair 250 one puff twice daily Stay on Spiriva Return 6 weeks

## 2011-03-05 ENCOUNTER — Ambulatory Visit (INDEPENDENT_AMBULATORY_CARE_PROVIDER_SITE_OTHER): Payer: PRIVATE HEALTH INSURANCE | Admitting: Critical Care Medicine

## 2011-03-05 DIAGNOSIS — J438 Other emphysema: Secondary | ICD-10-CM

## 2011-03-05 LAB — PULMONARY FUNCTION TEST

## 2011-03-05 NOTE — Progress Notes (Signed)
PFT done today. 

## 2011-03-11 ENCOUNTER — Encounter: Payer: Self-pay | Admitting: Critical Care Medicine

## 2011-03-25 ENCOUNTER — Ambulatory Visit: Payer: PRIVATE HEALTH INSURANCE | Attending: Hematology & Oncology | Admitting: Physical Therapy

## 2011-03-25 DIAGNOSIS — IMO0001 Reserved for inherently not codable concepts without codable children: Secondary | ICD-10-CM | POA: Insufficient documentation

## 2011-03-25 DIAGNOSIS — R293 Abnormal posture: Secondary | ICD-10-CM | POA: Insufficient documentation

## 2011-03-27 ENCOUNTER — Ambulatory Visit: Payer: PRIVATE HEALTH INSURANCE | Admitting: Rehabilitation

## 2011-04-01 ENCOUNTER — Ambulatory Visit: Payer: PRIVATE HEALTH INSURANCE | Attending: Hematology & Oncology | Admitting: Physical Therapy

## 2011-04-01 DIAGNOSIS — IMO0001 Reserved for inherently not codable concepts without codable children: Secondary | ICD-10-CM | POA: Insufficient documentation

## 2011-04-01 DIAGNOSIS — R293 Abnormal posture: Secondary | ICD-10-CM | POA: Insufficient documentation

## 2011-04-03 ENCOUNTER — Ambulatory Visit: Payer: PRIVATE HEALTH INSURANCE | Admitting: Physical Therapy

## 2011-04-08 ENCOUNTER — Ambulatory Visit: Payer: PRIVATE HEALTH INSURANCE | Admitting: Rehabilitation

## 2011-04-09 ENCOUNTER — Encounter: Payer: Self-pay | Admitting: Critical Care Medicine

## 2011-04-09 ENCOUNTER — Ambulatory Visit (INDEPENDENT_AMBULATORY_CARE_PROVIDER_SITE_OTHER): Payer: PRIVATE HEALTH INSURANCE | Admitting: Critical Care Medicine

## 2011-04-09 DIAGNOSIS — J438 Other emphysema: Secondary | ICD-10-CM

## 2011-04-09 DIAGNOSIS — F17200 Nicotine dependence, unspecified, uncomplicated: Secondary | ICD-10-CM

## 2011-04-09 DIAGNOSIS — Z23 Encounter for immunization: Secondary | ICD-10-CM

## 2011-04-09 DIAGNOSIS — C50919 Malignant neoplasm of unspecified site of unspecified female breast: Secondary | ICD-10-CM

## 2011-04-09 NOTE — Progress Notes (Signed)
Subjective:    [name], female    DOB: [birth year], 70 y.o.   MRN: [id number]  Shortness of Breath This is a chronic problem. The current episode started more than 1 year ago. Episode frequency: Dyspnea is with exertion only. The problem has been gradually improving (tends to have two to three exac per year). Duration: recovers in . Associated symptoms include sputum production. Pertinent negatives include no abdominal pain, chest pain, ear pain, fever, headaches, hemoptysis, leg swelling, neck pain, orthopnea, PND, rash, rhinorrhea, sore throat, vomiting or wheezing. The symptoms are aggravated by any activity, emotional upset, URIs, eating, exercise, fumes and lying flat. She has tried beta agonist inhalers and steroid inhalers for the symptoms. The treatment provided moderate (spiriva not holding over the full 24hrs) relief. Her past medical history is significant for COPD. There is no history of allergies, aspirin allergies, asthma, bronchiolitis, CAD, DVT, a heart failure, PE or pneumonia.  Cough This is a chronic problem. The current episode started more than 1 year ago. The problem has been rapidly improving. The cough is productive of sputum (clear to thin mucus to milky, is coughing harder than before). Associated symptoms include shortness of breath. Pertinent negatives include no chest pain, chills, ear pain, fever, headaches, heartburn, hemoptysis, myalgias, nasal congestion, postnasal drip, rash, rhinorrhea, sore throat or wheezing. The symptoms are aggravated by cold air (worse in the summer with humid air). She has tried a beta-agonist inhaler, ipratropium inhaler and oral steroids for the symptoms. The treatment provided moderate relief. Her past medical history is significant for bronchitis, COPD and emphysema. There is no history of asthma, bronchiectasis, environmental allergies or pneumonia.   70 y.o. F referred for Copd eval. Moved from [facility] 2008, saw  Lung MD there.  No lung MD has yet seen the pt. Has been on same meds as before.  All family is in [place] Dx Copd many years.(2004) Still smokes 1PPDx64yrs Side effects with smoking cessation tools:  Patches: nightmares,  Nicotrol?helps,  chantix nausea  04/09/2011 Could not tolerate cough on advair plus spiriva.    Now on Advair alone and cough is better. Still smoking some , using nicotrol inhaler  No support and out of patience   Past Medical History  Diagnosis Date  . Emphysema   . Diabetes mellitus type 2  . Hyperlipidemia   . Cancer breast ca  right     Family History  Problem Relation Age of Onset  . Heart failure Father   . Pneumonia Sister   . COPD Father   . Breast cancer    . Stroke Mother      History   Social History  . Marital Status: Single    Spouse Name: N/A    Number of Children: 0  . Years of Education: N/A   Occupational History  . retire    Social History Main Topics  . Smoking status: Current Everyday Smoker -- 0.3 packs/day for 54 years    Types: Cigarettes  . Smokeless tobacco: Never Used  . Alcohol Use: Not on file  . Drug Use: Not on file  . Sexually Active: Not on file   Other Topics Concern  . Not on file   Social History Narrative  . No narrative on file     Allergies  Allergen Reactions  . Erythromycin     Stomach cramps     Outpatient Prescriptions Prior to Visit  Medication Sig Dispense Refill  . albuterol (PROVENTIL) (2.5  MG/3ML) 0.083% nebulizer solution Take 2.5 mg by nebulization 4 (four) times daily as needed.  360 mL  6  . albuterol (VENTOLIN HFA) 108 (90 BASE) MCG/ACT inhaler Inhale 2 puffs into the lungs every 6 (six) hours as needed. As needed       . aspirin 81 MG tablet Take 81 mg by mouth daily.        . diazepam (VALIUM) 5 MG tablet Take 5 mg by mouth. 1 daily as needed       . Fluticasone-Salmeterol (ADVAIR) 250-50 MCG/DOSE AEPB Inhale 1 puff into the lungs 2 (two) times daily.  1 each  6  . letrozole  (FEMARA) 2.5 MG tablet Take 2.5 tablets by mouth Daily.      Marland Kitchen lovastatin (MEVACOR) 20 MG tablet 20 mg 1 by mouth daily      . metFORMIN (GLUCOPHAGE) 1000 MG tablet Take by mouth. Take 1 every am and 1/2 every evening      . Naproxen Sodium (ALEVE) 220 MG CAPS Take by mouth. As needed       . nicotine (NICOTROL) 10 MG inhaler Use 60-80 puffs per cartridge  Use 6-8 cartridges per day  168 each  6  . omeprazole (PRILOSEC OTC) 20 MG tablet Take 20 mg by mouth daily.        Marland Kitchen tiotropium (SPIRIVA HANDIHALER) 18 MCG inhalation capsule Place 18 mcg into inhaler and inhale daily.           Review of Systems  Constitutional: Positive for fatigue. Negative for fever, chills, diaphoresis, activity change, appetite change and unexpected weight change.  HENT: Positive for congestion. Negative for hearing loss, ear pain, nosebleeds, sore throat, facial swelling, rhinorrhea, sneezing, mouth sores, trouble swallowing, neck pain, neck stiffness, dental problem, voice change, postnasal drip, sinus pressure, tinnitus and ear discharge.   Eyes: Negative for photophobia, discharge, itching and visual disturbance.  Respiratory: Positive for sputum production and shortness of breath. Negative for apnea, cough, hemoptysis, choking, chest tightness, wheezing and stridor.   Cardiovascular: Negative for chest pain, palpitations, orthopnea, leg swelling and PND.  Gastrointestinal: Positive for constipation. Negative for heartburn, nausea, vomiting, abdominal pain, blood in stool and abdominal distention.  Genitourinary: Negative for dysuria, urgency, frequency, hematuria, flank pain, decreased urine volume and difficulty urinating.  Musculoskeletal: Negative for myalgias, back pain, joint swelling, arthralgias and gait problem.  Skin: Negative for color change, pallor and rash.  Neurological: Negative for dizziness, tremors, seizures, syncope, speech difficulty, weakness, light-headedness, numbness and headaches.    Hematological: Negative for environmental allergies and adenopathy. Does not bruise/bleed easily.  Psychiatric/Behavioral: Negative for confusion, sleep disturbance and agitation. The patient is nervous/anxious.        Objective:   Physical Exam  Filed Vitals:   04/09/11 1010  BP: 118/76  Pulse: 93  Temp: 98.3 F (36.8 C)  TempSrc: Oral  Height: 5\' 6"  (1.676 m)  Weight: 188 lb (85.276 kg)  SpO2: 94%    Gen: Pleasant, well-nourished, in no distress,  normal affect  ENT: No lesions,  mouth clear,  oropharynx clear, no postnasal drip  Neck: No JVD, no TMG, no carotid bruits  Lungs: No use of accessory muscles, no dullness to percussion, improved  airflow  Cardiovascular: RRR, heart sounds normal, no murmur or gallops, no peripheral edema  Abdomen: soft and NT, no HSM,  BS normal  Musculoskeletal: No deformities, no cyanosis or clubbing  Neuro: alert, non focal  Skin: Warm, no lesions or rashes   CT  Chest 01/15/11 Findings: There is centrilobular emphysema, mild. Both lungs are  clear. There are no osseous lesions. The right breast is absent.  There are no filling defects within either pulmonary arterial  system to suggest a segmental or subsegmental pulmonary embolus.  There is no axillary, hilar, mediastinal adenopathy. The thoracic  aorta has a normal caliber and appearance. The visualized upper  abdomen is unremarkable except for heterogeneous enhancement of the  spleen which may be related to phase of contrast versus cavernous  hemangioma.  Review of the MIP images confirms the above findings.  IMPRESSION:  Normal CT scan of the chest.   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 albuterol (PROVENTIL) (2.5 MG/3ML) 0.083% nebulizer solution Take 2.5 mg by nebulization 4 (four) times daily as needed.  360 mL  6  . albuterol (VENTOLIN HFA) 108 (90 BASE) MCG/ACT inhaler Inhale 2 puffs into the lungs every 6 (six) hours as needed. As needed       . aspirin 81 MG tablet Take 81 mg by mouth daily.        . diazepam (VALIUM) 5 MG tablet Take 5 mg by mouth. 1 daily as needed       . fluticasone (FLONASE) 50 MCG/ACT nasal spray Place 2 sprays into the nose daily.        . Fluticasone-Salmeterol (ADVAIR) 250-50 MCG/DOSE AEPB Inhale 1 puff into the lungs 2 (two) times daily.  1 each  6  . letrozole (FEMARA) 2.5 MG tablet Take 2.5 tablets by mouth Daily.      Marland Kitchen lovastatin (MEVACOR) 20 MG tablet 20 mg 1 by mouth daily      . metFORMIN (GLUCOPHAGE) 1000 MG tablet Take by mouth. Take 1 every am and 1/2 every evening      . Naproxen Sodium (ALEVE) 220 MG CAPS Take by mouth. As needed       . nicotine (NICOTROL) 10 MG inhaler Use 60-80 puffs per cartridge  Use 6-8 cartridges per day  168 each  6  . omeprazole (PRILOSEC OTC) 20 MG tablet Take 20 mg by mouth daily.        Marland Kitchen DISCONTD: tiotropium (SPIRIVA HANDIHALER) 18 MCG inhalation capsule Place 18 mcg into inhaler and inhale daily.

## 2011-04-09 NOTE — Assessment & Plan Note (Signed)
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

## 2011-04-09 NOTE — Patient Instructions (Addendum)
A referral to Behavioral health for smoking cessation will be made No change Advair  Return 4 months  Work on smoking cessation Flu and pneumonia shot

## 2011-04-10 ENCOUNTER — Ambulatory Visit: Payer: PRIVATE HEALTH INSURANCE | Admitting: Rehabilitation

## 2011-04-15 ENCOUNTER — Encounter: Payer: PRIVATE HEALTH INSURANCE | Admitting: Physical Therapy

## 2011-04-28 LAB — GLUCOSE, CAPILLARY: Glucose-Capillary: 121 — ABNORMAL HIGH

## 2011-04-28 LAB — CANCER ANTIGEN 27.29: CA 27.29: 54 — ABNORMAL HIGH

## 2011-04-28 LAB — COMPREHENSIVE METABOLIC PANEL
ALT: 26
AST: 24
Albumin: 4.1
Alkaline Phosphatase: 87
Glucose, Bld: 140 — ABNORMAL HIGH
Potassium: 4.1
Sodium: 137
Total Protein: 7

## 2011-04-28 LAB — DIFFERENTIAL
Basophils Relative: 1
Eosinophils Absolute: 0.1
Eosinophils Relative: 2
Monocytes Absolute: 0.6
Monocytes Relative: 9
Neutrophils Relative %: 53

## 2011-04-28 LAB — CBC
Hemoglobin: 15.5 — ABNORMAL HIGH
RDW: 12.6

## 2011-05-11 LAB — CBC
HCT: 35.7 — ABNORMAL LOW
HCT: 37.9
HCT: 47.2 — ABNORMAL HIGH
Hemoglobin: 12.6
Hemoglobin: 13.2
Hemoglobin: 16.7 — ABNORMAL HIGH
MCV: 89.5
MCV: 91.3
MCV: 91.3
RBC: 4.16
RDW: 13
WBC: 16 — ABNORMAL HIGH
WBC: 16.4 — ABNORMAL HIGH

## 2011-05-11 LAB — BASIC METABOLIC PANEL
BUN: 12
CO2: 25
Chloride: 103
Chloride: 99
Creatinine, Ser: 1.1
GFR calc Af Amer: >60
GFR calc non Af Amer: 55 — ABNORMAL LOW
Glucose, Bld: 176 — ABNORMAL HIGH
Potassium: 4.3
Potassium: 4.6
Sodium: 134 — ABNORMAL LOW
Sodium: 135

## 2011-05-11 LAB — DIFFERENTIAL
Eosinophils Absolute: 0
Eosinophils Relative: 0
Lymphocytes Relative: 4 — ABNORMAL LOW
Lymphs Abs: 0.6 — ABNORMAL LOW
Monocytes Absolute: 0.6

## 2011-05-11 LAB — HEMOGLOBIN A1C: Hgb A1c MFr Bld: 5.9

## 2011-05-11 LAB — ABO/RH: ABO/RH(D): B POS

## 2011-05-18 ENCOUNTER — Ambulatory Visit
Admission: RE | Admit: 2011-05-18 | Discharge: 2011-05-18 | Disposition: A | Discharge status: Home / Self Care | Payer: PRIVATE HEALTH INSURANCE | Source: Ambulatory Visit | Attending: Hematology & Oncology | Admitting: Hematology & Oncology

## 2011-05-18 DIAGNOSIS — Z9011 Acquired absence of right breast and nipple: Secondary | ICD-10-CM

## 2011-05-19 ENCOUNTER — Telehealth: Payer: Self-pay | Admitting: Critical Care Medicine

## 2011-05-19 NOTE — Telephone Encounter (Signed)
There is no association with advair and hyperglycemia

## 2011-05-19 NOTE — Telephone Encounter (Signed)
Pt states that since starting advair she has been having trouble controlling her glucose levels.  She states that her PCP, and pharmacists told her that advair could effect her blood sugar levels. She wants to know can this be changed to something else that will not do this. Please advise. Carron Curie, CMA

## 2011-05-20 NOTE — Telephone Encounter (Signed)
Pt returning call can be reached at [phone].Ashley Savage

## 2011-05-20 NOTE — Telephone Encounter (Signed)
Called # provided above - [phone] 

## 2011-05-20 NOTE — Telephone Encounter (Signed)
Called, spoke with pt.  I informed her, per PW, there is no association with advair and hyperglycemia.  She verbalized understanding of this and had no further questions/concerns at this time.  Advised to call back if anything further needed.  She verbalized understanding of this.

## 2011-07-17 ENCOUNTER — Other Ambulatory Visit: Payer: Self-pay | Admitting: *Deleted

## 2011-07-17 DIAGNOSIS — C50419 Malignant neoplasm of upper-outer quadrant of unspecified female breast: Secondary | ICD-10-CM

## 2011-07-17 MED ORDER — LETROZOLE 2.5 MG PO TABS: 2.5000 mg | ORAL_TABLET | Freq: Every day | ORAL | Status: DC

## 2011-07-20 ENCOUNTER — Other Ambulatory Visit: Payer: Self-pay | Admitting: *Deleted

## 2011-07-20 DIAGNOSIS — C50419 Malignant neoplasm of upper-outer quadrant of unspecified female breast: Secondary | ICD-10-CM

## 2011-07-20 MED ORDER — LETROZOLE 2.5 MG PO TABS: 2.5000 mg | ORAL_TABLET | Freq: Every day | ORAL | Status: DC

## 2011-08-20 ENCOUNTER — Other Ambulatory Visit (HOSPITAL_BASED_OUTPATIENT_CLINIC_OR_DEPARTMENT_OTHER): Payer: Medicare Other | Admitting: Lab

## 2011-08-20 ENCOUNTER — Ambulatory Visit (HOSPITAL_BASED_OUTPATIENT_CLINIC_OR_DEPARTMENT_OTHER): Payer: Medicare Other | Admitting: Hematology & Oncology

## 2011-08-20 VITALS — BP 138/74 | HR 68 | Temp 96.9°F | Ht 65.0 in | Wt 186.0 lb

## 2011-08-20 DIAGNOSIS — J449 Chronic obstructive pulmonary disease, unspecified: Secondary | ICD-10-CM

## 2011-08-20 DIAGNOSIS — C50419 Malignant neoplasm of upper-outer quadrant of unspecified female breast: Secondary | ICD-10-CM

## 2011-08-20 DIAGNOSIS — C50919 Malignant neoplasm of unspecified site of unspecified female breast: Secondary | ICD-10-CM

## 2011-08-20 DIAGNOSIS — Z170 Estrogen receptor positive status [ER+]: Secondary | ICD-10-CM

## 2011-08-20 DIAGNOSIS — F17200 Nicotine dependence, unspecified, uncomplicated: Secondary | ICD-10-CM

## 2011-08-20 LAB — COMPREHENSIVE METABOLIC PANEL
AST: 36 U/L (ref 0–37)
Alkaline Phosphatase: 94 U/L (ref 39–117)
BUN: 10 mg/dL (ref 6–23)
Creatinine, Ser: 0.77 mg/dL (ref 0.50–1.10)
Potassium: 4.4 mEq/L (ref 3.5–5.3)

## 2011-08-20 LAB — CBC WITH DIFFERENTIAL (CANCER CENTER ONLY)
BASO#: 0 10*3/uL (ref 0.0–0.2)
BASO%: 0.6 % (ref 0.0–2.0)
EOS%: 3.4 % (ref 0.0–7.0)
HGB: 14.6 g/dL (ref 11.6–15.9)
MCH: 31.9 pg (ref 26.0–34.0)
MCHC: 34.5 g/dL (ref 32.0–36.0)
MONO%: 9.2 % (ref 0.0–13.0)
NEUT#: 3.7 10*3/uL (ref 1.5–6.5)
NEUT%: 57.8 % (ref 39.6–80.0)
RDW: 12.6 % (ref 11.1–15.7)

## 2011-08-20 NOTE — Progress Notes (Signed)
This office note has been dictated.

## 2011-08-21 NOTE — Progress Notes (Signed)
CC:   Dr. Juluis Rainier  DIAGNOSES: 1. 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 2. Remote history of stage I infiltrating ductal carcinoma of the     right breast in 2005.  CURRENT THERAPY:  Femara 2.5 mg p.o. daily.  INTERIM HISTORY:  Ms. Adger comes in for followup.  She is doing okay. She has no complaints.  She was last seen in July.  There were some issues with respect to chest pain and dyspnea.  She had an EKG which showed some ischemic changes.  She subsequently was sent to the ER for evaluation.  Since then, she has been doing okay.  She has really had no complaints. She is still smoking.  She is trying to cut back.  There has been no change in her medications.  She is doing well on the Femara.  Her last mammogram was done in October.  Everything looked okay on the mammogram for her left breast.  There were no suspicious abnormalities.  She was having some shoulder issues.  She did have a shoulder x-ray done in July.  This was the left shoulder.  This showed no acute findings.  She has had no change in bowel or bladder habits.  She has not noticed any leg swelling.  There have been no rashes.  PHYSICAL EXAM:  General:  This is a well-developed, well-nourished white female in no obvious distress.  Vital Signs:  Temperature 98, pulse 68, respiratory rate 20, blood pressure 138/74, weight 186.  Head/Neck: Exam shows a normocephalic, atraumatic skull.  There are no ocular or oral lesions.  There are no palpable cervical or supraclavicular lymph nodes.  Lungs:  Clear bilaterally.  Cardiac:  Regular rate and rhythm with a normal S1, S2.  There are no murmurs, rubs or bruits.  Breasts: Exam shows the left breast with no masses, edema or erythema.  There is no left axillary adenopathy.  Right chest wall shows a well-healed mastectomy.  She has no right chest wall nodules.  There is no right chest wall erythema.  There is no right axillary  adenopathy.  Abdomen: Soft with good bowel sounds.  There is no palpable abdominal mass. There is no fluid wave.  There is no palpable hepatosplenomegaly.  Back: No tenderness over the spine, ribs, or hips.  There is no kyphosis. Extremities:  No clubbing, cyanosis or edema.  Neurologic:  Exam shows no focal neurological deficits.  LABORATORY STUDIES:  White cell count 6.4, hemoglobin 14.6, hematocrit 42.3, platelet count 132.  IMPRESSION:  Ms. Blue is a 71 year old white female with a history of a 2nd ductal carcinoma of the right breast.  The 1st right breast carcinoma was back in 2005.  She then developed a 2nd separate primary of the right breast.  This was a stage IIA, node negative.  The tumor was ER POSITIVE.  Again, I did not see any evidence of recurrent disease.  I think her prognosis will be clearly dictated by her COPD and smoking.  We will plan to get her back in 6 more months for followup.  I do not see any need for any blood work or x-rays in between visits.    ______________________________ Josph Macho, M.D. PRE/MEDQ  D:  08/20/2011  T:  08/20/2011  Job:  [id number]

## 2011-09-17 ENCOUNTER — Ambulatory Visit (INDEPENDENT_AMBULATORY_CARE_PROVIDER_SITE_OTHER): Payer: Medicare Other | Admitting: Critical Care Medicine

## 2011-09-17 ENCOUNTER — Encounter: Payer: Self-pay | Admitting: Critical Care Medicine

## 2011-09-17 DIAGNOSIS — J438 Other emphysema: Secondary | ICD-10-CM

## 2011-09-17 MED ORDER — FLUTICASONE-SALMETEROL 250-50 MCG/DOSE IN AEPB: 1.0000 | INHALATION_SPRAY | Freq: Two times a day (BID) | RESPIRATORY_TRACT | Status: DC

## 2011-09-17 MED ORDER — ALBUTEROL SULFATE HFA 108 (90 BASE) MCG/ACT IN AERS: 2.0000 | INHALATION_SPRAY | RESPIRATORY_TRACT | Status: DC | PRN

## 2011-09-17 NOTE — Assessment & Plan Note (Signed)
Golds III Copd with ongoing smoking   Plan Continue current meds Pursue smoking cessation with behavioural counseling and nicotine patches

## 2011-09-17 NOTE — Patient Instructions (Signed)
No change in medications. Return in        6 months Focus on smoking cessation, join the support group

## 2011-09-17 NOTE — Progress Notes (Signed)
Subjective:    [name], female    DOB: 1941-01-23, 71 y.o.   MRN: [id number]  HPI 71 y.o. F referred for Copd eval. Moved from [facility] 2008, saw Lung MD there.  No lung MD has yet seen the pt. Has been on same meds as before.  All family is in [place] Dx Copd many years.(2004) Still smokes 1PPDx38yrs Side effects with smoking cessation tools:  Patches: nightmares,  Nicotrol?helps,  chantix nausea  04/08/11 Could not tolerate cough on advair plus spiriva.    Now on Advair alone and cough is better. Still smoking some , using nicotrol inhaler  No support and out of patience  09/17/2011 Could not tolerate the nicotrol and would cause a coughing.    Now is smoking 1/2PPD still. Pt notes cough is better.  Dyspnea is the same.  No real change compared to last OV. Comes and goes with dyspnea. Remains on Advair and doing well with this  Past Medical History  Diagnosis Date  . Emphysema   . Diabetes mellitus type 2  . Hyperlipidemia   . Cancer breast ca  right     Family History  Problem Relation Age of Onset  . Heart failure Father   . Pneumonia Sister   . COPD Father   . Breast cancer    . Stroke Mother      History   Social History  . Marital Status: Single    Spouse Name: N/A    Number of Children: 0  . Years of Education: N/A   Occupational History  . retire    Social History Main Topics  . Smoking status: Current Everyday Smoker    Types: Cigarettes  . Smokeless tobacco: Never Used   Comment: started smoking at age 42.  Currently smoking 1/2 ppd.  . Alcohol Use: Not on file  . Drug Use: Not on file  . Sexually Active: Not on file   Other Topics Concern  . Not on file   Social History Narrative  . No narrative on file     Allergies  Allergen Reactions  . Erythromycin     Stomach cramps     Outpatient Prescriptions Prior to Visit  Medication Sig Dispense Refill  . albuterol (PROVENTIL) (2.5 MG/3ML) 0.083% nebulizer solution Take  2.5 mg by nebulization 4 (four) times daily as needed.  360 mL  6  . diazepam (VALIUM) 5 MG tablet Take 5 mg by mouth. 1 daily as needed       . fluticasone (FLONASE) 50 MCG/ACT nasal spray Place 2 sprays into the nose daily as needed.       Marland Kitchen letrozole (FEMARA) 2.5 MG tablet Take 1 tablet (2.5 mg total) by mouth daily.  30 tablet  4  . lovastatin (MEVACOR) 20 MG tablet 20 mg 1 by mouth daily      . metFORMIN (GLUCOPHAGE) 1000 MG tablet Take 1,000 mg by mouth 2 (two) times daily.       . Naproxen Sodium (ALEVE) 220 MG CAPS Take by mouth. As needed       . omeprazole (PRILOSEC OTC) 20 MG tablet Take 20 mg by mouth daily.        Marland Kitchen albuterol (VENTOLIN HFA) 108 (90 BASE) MCG/ACT inhaler Inhale 2 puffs into the lungs every 6 (six) hours as needed. As needed       . Fluticasone-Salmeterol (ADVAIR) 250-50 MCG/DOSE AEPB Inhale 1 puff into the lungs 2 (two) times daily.  1  each  6  . aspirin 81 MG tablet Take 81 mg by mouth daily.        . nicotine (NICOTROL) 10 MG inhaler Use 60-80 puffs per cartridge  Use 6-8 cartridges per day  168 each  6     Review of Systems  Constitutional: Positive for fatigue. Negative for diaphoresis, activity change, appetite change and unexpected weight change.  HENT: Positive for congestion. Negative for hearing loss, nosebleeds, facial swelling, sneezing, mouth sores, trouble swallowing, neck stiffness, dental problem, voice change, sinus pressure, tinnitus and ear discharge.   Eyes: Negative for photophobia, discharge, itching and visual disturbance.  Respiratory: Negative for apnea, choking, chest tightness and stridor.   Cardiovascular: Negative for palpitations.  Gastrointestinal: Positive for constipation. Negative for nausea, blood in stool and abdominal distention.  Genitourinary: Negative for dysuria, urgency, frequency, hematuria, flank pain, decreased urine volume and difficulty urinating.  Musculoskeletal: Negative for back pain, joint swelling, arthralgias and  gait problem.  Skin: Negative for color change and pallor.  Neurological: Negative for dizziness, tremors, seizures, syncope, speech difficulty, weakness, light-headedness and numbness.  Hematological: Negative for adenopathy. Does not bruise/bleed easily.  Psychiatric/Behavioral: Negative for confusion, sleep disturbance and agitation. The patient is nervous/anxious.        Objective:   Physical Exam  Filed Vitals:   09/17/11 1042  BP: 122/72  Pulse: 86  Temp: 97.7 F (36.5 C)  TempSrc: Other (Comment)  Height: 5\' 7"  (1.702 m)  Weight: 187 lb (84.823 kg)  SpO2: 96%    Gen: Pleasant, well-nourished, in no distress,  normal affect  ENT: No lesions,  mouth clear,  oropharynx clear, no postnasal drip  Neck: No JVD, no TMG, no carotid bruits  Lungs: No use of accessory muscles, no dullness to percussion, improved  airflow  Cardiovascular: RRR, heart sounds normal, no murmur or gallops, no peripheral edema  Abdomen: soft and NT, no HSM,  BS normal  Musculoskeletal: No deformities, no cyanosis or clubbing  Neuro: alert, non focal  Skin: Warm, no lesions or rashes   CT Chest 01/15/11 Findings: There is centrilobular emphysema, mild. Both lungs are  clear. There are no osseous lesions. The right breast is absent.  There are no filling defects within either pulmonary arterial  system to suggest a segmental or subsegmental pulmonary embolus.  There is no axillary, hilar, mediastinal adenopathy. The thoracic  aorta has a normal caliber and appearance. The visualized upper  abdomen is unremarkable except for heterogeneous enhancement of the  spleen which may be related to phase of contrast versus cavernous  hemangioma.  Review of the MIP images confirms the above findings.  IMPRESSION:  Normal CT scan of the chest.   PFT Conversion 03/11/2011  FVC 2.46  FVC PREDICT 3.24  FVC  % Predicted 76  FEV1 1.3  FEV1 PREDICT 2.36  FEV % Predicted 55  FEV1/FVC 52.8  FEV1/FVC PRE  72  FeF 25-75 0.49  FeF 25-75 % Predicted 19  Residual Volume (ml) 2.98  RV % EXPECT 139  DLCO (ml/mmHg sec) 9  DLCO % EXPEC 39  DLCO/VA 2.35  DLCO/VA%EXP 66      Assessment & Plan:   Emphysema Golds III Copd with ongoing smoking   Plan Continue current meds Pursue smoking cessation with behavioural counseling and nicotine patches      Updated Medication List Outpatient Encounter Prescriptions as of 09/17/2011  Medication Sig Dispense Refill  . albuterol (PROVENTIL) (2.5 MG/3ML) 0.083% nebulizer solution Take 2.5 mg by nebulization  4 (four) times daily as needed.  360 mL  6  . cetirizine (ZYRTEC) 10 MG tablet Take 10 mg by mouth daily.      . diazepam (VALIUM) 5 MG tablet Take 5 mg by mouth. 1 daily as needed       . fluticasone (FLONASE) 50 MCG/ACT nasal spray Place 2 sprays into the nose daily as needed.       . Fluticasone-Salmeterol (ADVAIR) 250-50 MCG/DOSE AEPB Inhale 1 puff into the lungs 2 (two) times daily.  1 each  11  . letrozole (FEMARA) 2.5 MG tablet Take 1 tablet (2.5 mg total) by mouth daily.  30 tablet  4  . lovastatin (MEVACOR) 20 MG tablet 20 mg 1 by mouth daily      . metFORMIN (GLUCOPHAGE) 1000 MG tablet Take 1,000 mg by mouth 2 (two) times daily.       . Naproxen Sodium (ALEVE) 220 MG CAPS Take by mouth. As needed       . omeprazole (PRILOSEC OTC) 20 MG tablet Take 20 mg by mouth daily.        Marland Kitchen DISCONTD: albuterol (VENTOLIN HFA) 108 (90 BASE) MCG/ACT inhaler Inhale 2 puffs into the lungs every 6 (six) hours as needed. As needed       . DISCONTD: Fluticasone-Salmeterol (ADVAIR) 250-50 MCG/DOSE AEPB Inhale 1 puff into the lungs 2 (two) times daily.  1 each  6  . albuterol (PROVENTIL HFA;VENTOLIN HFA) 108 (90 BASE) MCG/ACT inhaler Inhale 2 puffs into the lungs every 4 (four) hours as needed for wheezing.  1 Inhaler  4  . DISCONTD: aspirin 81 MG tablet Take 81 mg by mouth daily.        Marland Kitchen DISCONTD: nicotine (NICOTROL) 10 MG inhaler Use 60-80 puffs per cartridge   Use 6-8 cartridges per day  168 each  6

## 2011-10-08 ENCOUNTER — Telehealth: Payer: Self-pay | Admitting: *Deleted

## 2011-10-08 MED ORDER — ALBUTEROL SULFATE HFA 108 (90 BASE) MCG/ACT IN AERS: 2.0000 | INHALATION_SPRAY | RESPIRATORY_TRACT | Status: DC | PRN

## 2011-10-08 NOTE — Telephone Encounter (Signed)
Called, spoke with pt.  She is aware ventolin changed to proair as insurance will not cover the ventolin.  She verbalized understanding of this and voiced no further questions/concerns at this time.  Called [facility], spoke with April.  Per April, they have rx for ventolin # 1 inhaler x 4 refills.  Advised to pls change this to proair # 1 x 4 as insurance will not cover ventolin.  She verbalized understanding of this and voiced no further questions at this time.

## 2011-10-20 LAB — HM DIABETES EYE EXAM

## 2011-11-12 ENCOUNTER — Telehealth: Payer: Self-pay | Admitting: Critical Care Medicine

## 2011-11-12 MED ORDER — FLUTICASONE-SALMETEROL 250-50 MCG/DOSE IN AEPB: 1.0000 | INHALATION_SPRAY | Freq: Two times a day (BID) | RESPIRATORY_TRACT | Status: DC

## 2011-11-12 NOTE — Telephone Encounter (Signed)
6 refills for Advair was phone in to [facility].

## 2011-12-23 ENCOUNTER — Other Ambulatory Visit: Payer: Self-pay | Admitting: Hematology & Oncology

## 2012-02-12 ENCOUNTER — Other Ambulatory Visit (HOSPITAL_BASED_OUTPATIENT_CLINIC_OR_DEPARTMENT_OTHER): Payer: Medicare Other | Admitting: Lab

## 2012-02-12 ENCOUNTER — Ambulatory Visit (HOSPITAL_BASED_OUTPATIENT_CLINIC_OR_DEPARTMENT_OTHER): Payer: Medicare Other | Admitting: Hematology & Oncology

## 2012-02-12 VITALS — BP 120/68 | HR 87 | Temp 97.7°F | Ht 67.0 in | Wt 172.0 lb

## 2012-02-12 DIAGNOSIS — C50419 Malignant neoplasm of upper-outer quadrant of unspecified female breast: Secondary | ICD-10-CM

## 2012-02-12 DIAGNOSIS — C50919 Malignant neoplasm of unspecified site of unspecified female breast: Secondary | ICD-10-CM

## 2012-02-12 DIAGNOSIS — R634 Abnormal weight loss: Secondary | ICD-10-CM

## 2012-02-12 DIAGNOSIS — Z853 Personal history of malignant neoplasm of breast: Secondary | ICD-10-CM

## 2012-02-12 LAB — CBC WITH DIFFERENTIAL (CANCER CENTER ONLY)
BASO#: 0 10*3/uL (ref 0.0–0.2)
EOS%: 3.6 % (ref 0.0–7.0)
HCT: 41.1 % (ref 34.8–46.6)
HGB: 14.4 g/dL (ref 11.6–15.9)
LYMPH%: 26.8 % (ref 14.0–48.0)
MCH: 32 pg (ref 26.0–34.0)
MCHC: 35 g/dL (ref 32.0–36.0)
MONO%: 11.4 % (ref 0.0–13.0)
NEUT%: 57.7 % (ref 39.6–80.0)

## 2012-02-12 LAB — COMPREHENSIVE METABOLIC PANEL
AST: 34 U/L (ref 0–37)
Alkaline Phosphatase: 91 U/L (ref 39–117)
BUN: 12 mg/dL (ref 6–23)
Calcium: 9.6 mg/dL (ref 8.4–10.5)
Creatinine, Ser: 0.69 mg/dL (ref 0.50–1.10)

## 2012-02-12 LAB — T3 UPTAKE: T3 Uptake: 25.2 % (ref 22.5–37.0)

## 2012-02-12 LAB — T4: T4, Total: 11.1 ug/dL (ref 5.0–12.5)

## 2012-02-12 NOTE — Progress Notes (Signed)
This office note has been dictated.

## 2012-02-15 NOTE — Progress Notes (Signed)
CC:   Juluis Rainier, MD, Fax [phone]  DIAGNOSES: 1. 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 2. Remote history of stage I infiltrating ductal carcinoma of the     right breast, 2005. 3. Unintentional weight loss.  CURRENT THERAPY:  Femara 2.5 mg p.o. daily.  INTERIM HISTORY:  Ms. Aloi comes in for a routine followup.  We last saw her back in January.  Since then, she has been complaining of weight loss.  Since we last saw her, she has lost 15 pounds.  She does have other health issues.  She does have diabetes.  She says that she began to notice the weight loss when she started Advair.  She also had been put on prednisone.  She says that her blood sugars have been a little on the high side but otherwise have been pretty well-controlled.  She has had no diarrhea.  There is no nausea or vomiting.  There are no rashes appreciated.  She says that her skin is dry.  She has had no headache.  There is no double vision or blurred vision.  She has no thrush.  PHYSICAL EXAMINATION:  This is a well-developed, well-nourished white female in no obvious distress.  Vital signs:  Temperature of 97.7, pulse 87, respiratory rate 20, blood pressure 120/68.  Weight is 172.  Head and neck:  Normocephalic, atraumatic skull.  There are no ocular or oral lesions.  There are no palpable cervical or supraclavicular lymph nodes. Lungs:  Clear bilaterally.  She has some scattered crackles.  Cardiac: Regular rate and rhythm with a normal S1 and S2.  There are no murmurs, rubs or bruits.  Abdomen:  Soft with good bowel sounds.  There is no palpable abdominal mass.  There is no fluid wave.  There is no palpable hepatosplenomegaly.  Breasts:  Left breast with no masses, edema or erythema.  There is no left axillary adenopathy.  Right chest wall shows well-healed mastectomy.  No right chest wall nodules are noted.  There is no right axillary adenopathy.  Back:  No tenderness over the  spine, ribs, or hips.  Extremities:  No clubbing, cyanosis or edema. Neurologic:  No focal neurological deficits.  Skin:  No rashes, ecchymosis or petechia.  LABORATORIES STUDIES:  White cell count is 5.6, hemoglobin 14.4, hematocrit 41.1, platelet count 137.  IMPRESSION:  Ms. 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  She was diagnosed back in 2009.  She is on Femara.  We will continue her on Femara for a total of 5 years.  The weight loss is a whole separate issue.  Personally, I believe that this is more related to her having the underlying chronic obstructive pulmonary disease.  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 She is still smoking.  I do want to see her back in 3 months.  I am sending thyroid studies on her.  If we find that her weight loss progresses when we see her back, then we may have to do some scans on her.    ______________________________ Josph Macho, M.D. PRE/MEDQ  D:  02/12/2012  T:  02/12/2012  Job:  [id number]

## 2012-02-17 ENCOUNTER — Telehealth: Payer: Self-pay | Admitting: *Deleted

## 2012-02-17 NOTE — Telephone Encounter (Addendum)
Message copied by Wynonia Hazard on Wed Feb 17, 2012  4:31 PM ------      Message from: Arlan Organ R      Created: Wed Feb 17, 2012  7:33 AM       Call and tell her that her thyroid is normal.. Ashley Savage  02/17/12: Spoke with pt, gave her the above message with verbalized understanding.

## 2012-03-17 ENCOUNTER — Encounter: Payer: Self-pay | Admitting: Critical Care Medicine

## 2012-03-17 ENCOUNTER — Ambulatory Visit (INDEPENDENT_AMBULATORY_CARE_PROVIDER_SITE_OTHER): Payer: Medicare Other | Admitting: Critical Care Medicine

## 2012-03-17 VITALS — BP 124/68 | HR 90 | Temp 98.4°F | Ht 67.0 in | Wt 177.0 lb

## 2012-03-17 DIAGNOSIS — J4489 Other specified chronic obstructive pulmonary disease: Secondary | ICD-10-CM

## 2012-03-17 DIAGNOSIS — D696 Thrombocytopenia, unspecified: Secondary | ICD-10-CM

## 2012-03-17 DIAGNOSIS — J449 Chronic obstructive pulmonary disease, unspecified: Secondary | ICD-10-CM

## 2012-03-17 HISTORY — DX: Thrombocytopenia, unspecified: D69.6

## 2012-03-17 MED ORDER — FLUTICASONE-SALMETEROL 250-50 MCG/DOSE IN AEPB: 1.0000 | INHALATION_SPRAY | Freq: Two times a day (BID) | RESPIRATORY_TRACT | Status: DC

## 2012-03-17 NOTE — Progress Notes (Signed)
Subjective:    [name], female    DOB: 1940-09-04, 71 y.o.   MRN: [id number]  HPI  71 y.o. F referred for Copd eval. Moved from [facility] 2008, saw Lung MD there.  No lung MD has yet seen the pt. Has been on same meds as before.  All family is in [place] Dx Copd many years.(2004) Still smokes 1PPDx63yrs Side effects with smoking cessation tools:  Patches: nightmares,  Nicotrol?helps,  chantix nausea  04/08/11 Could not tolerate cough on advair plus spiriva.    Now on Advair alone and cough is better. Still smoking some , using nicotrol inhaler  No support and out of patience  09/17/2011 Could not tolerate the nicotrol and would cause a coughing.    Now is smoking 1/2PPD still. Pt notes cough is better.  Dyspnea is the same.  No real change compared to last OV. Comes and goes with dyspnea. Remains on Advair and doing well with this   03/17/2012 Pt is still smoking.  Pt is using nicotrol  But not regularly.  Pt still dyspneic with exertion.   Still coughing some clear white Pt denies any significant sore throat, nasal congestion or excess secretions, fever, chills, sweats, unintended weight loss, pleurtic or exertional chest pain, orthopnea PND, or leg swelling Pt denies any increase in rescue therapy over baseline, denies waking up needing it or having any early am or nocturnal exacerbations of coughing/wheezing/or dyspnea. Pt also denies any obvious fluctuation in symptoms with  weather or environmental change or other alleviating or aggravating factors   Past Medical History  Diagnosis Date  . Emphysema   . Diabetes mellitus type 2  . Hyperlipidemia   . Cancer breast ca  right     Family History  Problem Relation Age of Onset  . Heart failure Father   . Pneumonia Sister   . COPD Father   . Breast cancer    . Stroke Mother      History   Social History  . Marital Status: Single    Spouse Name: N/A    Number of Children: 0  . Years of Education:  N/A   Occupational History  . retire    Social History Main Topics  . Smoking status: Current Everyday Smoker    Types: Cigarettes  . Smokeless tobacco: Never Used   Comment: started smoking at age 39.  Currently smoking 1/2 ppd.  . Alcohol Use: Not on file  . Drug Use: Not on file  . Sexually Active: Not on file   Other Topics Concern  . Not on file   Social History Narrative  . No narrative on file     Allergies  Allergen Reactions  . Erythromycin     Stomach cramps     Outpatient Prescriptions Prior to Visit  Medication Sig Dispense Refill  . albuterol (PROAIR HFA) 108 (90 BASE) MCG/ACT inhaler Inhale 2 puffs into the lungs every 4 (four) hours as needed for wheezing.  1 Inhaler  4  . albuterol (PROVENTIL) (2.5 MG/3ML) 0.083% nebulizer solution Take 2.5 mg by nebulization 4 (four) times daily as needed.  360 mL  6  . cetirizine (ZYRTEC) 10 MG tablet Take 10 mg by mouth as needed.       . diazepam (VALIUM) 5 MG tablet Take 5 mg by mouth. 1 daily as needed       . fluticasone (FLONASE) 50 MCG/ACT nasal spray Place 2 sprays into the nose daily as  needed.       . Fluticasone-Salmeterol (ADVAIR) 250-50 MCG/DOSE AEPB Inhale 1 puff into the lungs 2 (two) times daily.  1 each  6  . letrozole (FEMARA) 2.5 MG tablet Take 1 tablet (2.5 mg total) by mouth daily.  30 tablet  4  . lovastatin (MEVACOR) 20 MG tablet 20 mg 1 by mouth daily      . metFORMIN (GLUCOPHAGE) 1000 MG tablet Take 1,000 mg by mouth 2 (two) times daily.       . Naproxen Sodium (ALEVE) 220 MG CAPS Take by mouth. As needed       . omeprazole (PRILOSEC OTC) 20 MG tablet Take 20 mg by mouth daily.        Marland Kitchen terbinafine (LAMISIL) 250 MG tablet daily.          Review of Systems  Constitutional: Positive for fatigue. Negative for diaphoresis, activity change, appetite change and unexpected weight change.  HENT: Positive for congestion. Negative for hearing loss, nosebleeds, facial swelling, sneezing, mouth sores,  trouble swallowing, neck stiffness, dental problem, voice change, sinus pressure, tinnitus and ear discharge.   Eyes: Negative for photophobia, discharge, itching and visual disturbance.  Respiratory: Negative for apnea, choking, chest tightness and stridor.   Cardiovascular: Negative for palpitations.  Gastrointestinal: Positive for constipation. Negative for nausea, blood in stool and abdominal distention.  Genitourinary: Negative for dysuria, urgency, frequency, hematuria, flank pain, decreased urine volume and difficulty urinating.  Musculoskeletal: Negative for back pain, joint swelling, arthralgias and gait problem.  Skin: Negative for color change and pallor.  Neurological: Negative for dizziness, tremors, seizures, syncope, speech difficulty, weakness, light-headedness and numbness.  Hematological: Negative for adenopathy. Does not bruise/bleed easily.  Psychiatric/Behavioral: Negative for confusion, disturbed wake/sleep cycle and agitation. The patient is nervous/anxious.        Objective:   Physical Exam  Filed Vitals:   03/17/12 1345  BP: 124/68  Pulse: 90  Temp: 98.4 F (36.9 C)  TempSrc: Oral  Height: 5\' 7"  (1.702 m)  Weight: 177 lb (80.287 kg)  SpO2: 93%    Gen: Pleasant, well-nourished, in no distress,  normal affect  ENT: No lesions,  mouth clear,  oropharynx clear, no postnasal drip  Neck: No JVD, no TMG, no carotid bruits  Lungs: No use of accessory muscles, no dullness to percussion, improved  airflow  Cardiovascular: RRR, heart sounds normal, no murmur or gallops, no peripheral edema  Abdomen: soft and NT, no HSM,  BS normal  Musculoskeletal: No deformities, no cyanosis or clubbing  Neuro: alert, non focal  Skin: Warm, no lesions or rashes   CT Chest 01/15/11 Findings: There is centrilobular emphysema, mild. Both lungs are  clear. There are no osseous lesions. The right breast is absent.  There are no filling defects within either pulmonary  arterial  system to suggest a segmental or subsegmental pulmonary embolus.  There is no axillary, hilar, mediastinal adenopathy. The thoracic  aorta has a normal caliber and appearance. The visualized upper  abdomen is unremarkable except for heterogeneous enhancement of the  spleen which may be related to phase of contrast versus cavernous  hemangioma.  Review of the MIP images confirms the above findings.  IMPRESSION:  Normal CT scan of the chest.   PFT Conversion 03/11/2011  FVC 2.46  FVC PREDICT 3.24  FVC  % Predicted 76  FEV1 1.3  FEV1 PREDICT 2.36  FEV % Predicted 55  FEV1/FVC 52.8  FEV1/FVC PRE 72  FeF 25-75 0.49  FeF 25-75 %  Predicted 19  Residual Volume (ml) 2.98  RV % EXPECT 139  DLCO (ml/mmHg sec) 9  DLCO % EXPEC 39  DLCO/VA 2.35  DLCO/VA%EXP 66      Assessment & Plan:   No problem-specific assessment & plan notes found for this encounter.   Updated Medication List Outpatient Encounter Prescriptions as of 03/17/2012  Medication Sig Dispense Refill  . albuterol (PROAIR HFA) 108 (90 BASE) MCG/ACT inhaler Inhale 2 puffs into the lungs every 4 (four) hours as needed for wheezing.  1 Inhaler  4  . albuterol (PROVENTIL) (2.5 MG/3ML) 0.083% nebulizer solution Take 2.5 mg by nebulization 4 (four) times daily as needed.  360 mL  6  . cetirizine (ZYRTEC) 10 MG tablet Take 10 mg by mouth as needed.       . diazepam (VALIUM) 5 MG tablet Take 5 mg by mouth. 1 daily as needed       . fluticasone (FLONASE) 50 MCG/ACT nasal spray Place 2 sprays into the nose daily as needed.       . Fluticasone-Salmeterol (ADVAIR) 250-50 MCG/DOSE AEPB Inhale 1 puff into the lungs 2 (two) times daily.  1 each  6  . ibuprofen (ADVIL,MOTRIN) 100 MG tablet Take 100 mg by mouth as needed.      Marland Kitchen letrozole (FEMARA) 2.5 MG tablet Take 1 tablet (2.5 mg total) by mouth daily.  30 tablet  4  . lovastatin (MEVACOR) 20 MG tablet 20 mg 1 by mouth daily      . metFORMIN (GLUCOPHAGE) 1000 MG tablet Take  1,000 mg by mouth 2 (two) times daily.       . Multiple Vitamins-Minerals (WOMENS MULTIVITAMIN PLUS) TABS Take by mouth daily.      . Naproxen Sodium (ALEVE) 220 MG CAPS Take by mouth. As needed       . omeprazole (PRILOSEC OTC) 20 MG tablet Take 20 mg by mouth daily.        Marland Kitchen terbinafine (LAMISIL) 250 MG tablet daily.

## 2012-03-17 NOTE — Patient Instructions (Addendum)
No change in medications. Return in        6  months Remember to get a flu vaccine .

## 2012-03-18 NOTE — Assessment & Plan Note (Signed)
[place] C Copd stable at present Plan No change in inhaled or maintenance medications. Return in 6 months Cont to pursue smoking cessation with nicotrol

## 2012-04-07 ENCOUNTER — Telehealth: Payer: Self-pay | Admitting: Hematology & Oncology

## 2012-04-07 NOTE — Telephone Encounter (Signed)
Patient called and cx 05/13/12 apt and resch for 04/22/12 due to weight loss.

## 2012-04-12 ENCOUNTER — Other Ambulatory Visit: Payer: Self-pay | Admitting: Family Medicine

## 2012-04-12 DIAGNOSIS — Z124 Encounter for screening for malignant neoplasm of cervix: Secondary | ICD-10-CM | POA: Insufficient documentation

## 2012-04-14 ENCOUNTER — Other Ambulatory Visit (HOSPITAL_COMMUNITY)
Admission: RE | Admit: 2012-04-14 | Discharge: 2012-04-14 | Disposition: A | Discharge status: Home / Self Care | Payer: Medicare Other | Source: Ambulatory Visit | Attending: Family Medicine | Admitting: Family Medicine

## 2012-04-15 ENCOUNTER — Emergency Department (HOSPITAL_BASED_OUTPATIENT_CLINIC_OR_DEPARTMENT_OTHER)
Admission: EM | Admit: 2012-04-15 | Discharge: 2012-04-15 | Disposition: A | Discharge status: Home / Self Care | Payer: Medicare Other | Attending: Emergency Medicine | Admitting: Emergency Medicine

## 2012-04-15 ENCOUNTER — Encounter (HOSPITAL_BASED_OUTPATIENT_CLINIC_OR_DEPARTMENT_OTHER): Payer: Self-pay | Admitting: Family Medicine

## 2012-04-15 DIAGNOSIS — F17200 Nicotine dependence, unspecified, uncomplicated: Secondary | ICD-10-CM | POA: Insufficient documentation

## 2012-04-15 DIAGNOSIS — E785 Hyperlipidemia, unspecified: Secondary | ICD-10-CM | POA: Insufficient documentation

## 2012-04-15 DIAGNOSIS — F19951 Other psychoactive substance use, unspecified with psychoactive substance-induced psychotic disorder with hallucinations: Secondary | ICD-10-CM | POA: Insufficient documentation

## 2012-04-15 DIAGNOSIS — T887XXA Unspecified adverse effect of drug or medicament, initial encounter: Secondary | ICD-10-CM

## 2012-04-15 DIAGNOSIS — F329 Major depressive disorder, single episode, unspecified: Secondary | ICD-10-CM | POA: Insufficient documentation

## 2012-04-15 DIAGNOSIS — T43205A Adverse effect of unspecified antidepressants, initial encounter: Secondary | ICD-10-CM | POA: Insufficient documentation

## 2012-04-15 DIAGNOSIS — F3289 Other specified depressive episodes: Secondary | ICD-10-CM | POA: Insufficient documentation

## 2012-04-15 DIAGNOSIS — R4182 Altered mental status, unspecified: Secondary | ICD-10-CM | POA: Insufficient documentation

## 2012-04-15 DIAGNOSIS — E119 Type 2 diabetes mellitus without complications: Secondary | ICD-10-CM | POA: Insufficient documentation

## 2012-04-15 DIAGNOSIS — F419 Anxiety disorder, unspecified: Secondary | ICD-10-CM

## 2012-04-15 DIAGNOSIS — F411 Generalized anxiety disorder: Secondary | ICD-10-CM | POA: Insufficient documentation

## 2012-04-15 DIAGNOSIS — Z79899 Other long term (current) drug therapy: Secondary | ICD-10-CM | POA: Insufficient documentation

## 2012-04-15 DIAGNOSIS — J438 Other emphysema: Secondary | ICD-10-CM | POA: Insufficient documentation

## 2012-04-15 DIAGNOSIS — Z853 Personal history of malignant neoplasm of breast: Secondary | ICD-10-CM | POA: Insufficient documentation

## 2012-04-15 HISTORY — DX: Depression, unspecified: F32.A

## 2012-04-15 HISTORY — DX: Anxiety disorder, unspecified: F41.9

## 2012-04-15 HISTORY — DX: Major depressive disorder, single episode, unspecified: F32.9

## 2012-04-15 HISTORY — DX: Panic disorder (episodic paroxysmal anxiety): F41.0

## 2012-04-15 LAB — GLUCOSE, CAPILLARY: Glucose-Capillary: 166 mg/dL — ABNORMAL HIGH (ref 70–99)

## 2012-04-15 NOTE — ED Provider Notes (Signed)
History     CSN: [id number]  Arrival date & time 04/15/12  1029   First MD Initiated Contact with Patient 04/15/12 1115      Chief Complaint  Patient presents with  . Anxiety    (Consider location/radiation/quality/duration/timing/severity/associated sxs/prior treatment) Patient is a 71 y.o. female presenting with anxiety. The history is provided by the patient.  Anxiety  She was given a prescription for citalopram which she was to take for anxiety and took her first dose at 8:30 PM last night. At 2 AM, she woke up and noted that she was disoriented and confused. She did have some intermittent hallucinations. She describes it like being on a bad trip. The confusion has subsided and she is no longer hallucinating, but she has significant residual anxiety. She's concerned that this may be side effects of the new medication. She normally takes diazepam at home for anxiety, but was uncertain whether he would be safe to mix with her medications, so she came to the emergency department.  Past Medical History  Diagnosis Date  . Emphysema   . Diabetes mellitus type 2  . Hyperlipidemia   . Cancer breast ca  right  . Anxiety   . Panic attacks   . Depression     Past Surgical History  Procedure Date  . Gallbladder surgery 1992  . Breast surgery 2009 right  . Appendectomy 2007  . Knee surgery   . Mandible fracture surgery     Family History  Problem Relation Age of Onset  . Heart failure Father   . Pneumonia Sister   . COPD Father   . Breast cancer    . Stroke Mother     History  Substance Use Topics  . Smoking status: Current Every Day Smoker    Types: Cigarettes  . Smokeless tobacco: Never Used   Comment: started smoking at age 61.  Currently smoking 1/2 ppd.  . Alcohol Use: Not on file    OB History    Grav Para Term Preterm Abortions TAB SAB Ect Mult Living                  Review of Systems  All other systems reviewed and are negative.    Allergies    Erythromycin  Home Medications   Current Outpatient Rx  Name Route Sig Dispense Refill  . CITALOPRAM HYDROBROMIDE 20 MG PO TABS Oral Take 20 mg by mouth daily.    . ALBUTEROL SULFATE HFA 108 (90 BASE) MCG/ACT IN AERS Inhalation Inhale 2 puffs into the lungs every 4 (four) hours as needed for wheezing. 1 Inhaler 4  . ALBUTEROL SULFATE (2.5 MG/3ML) 0.083% IN NEBU Nebulization Take 2.5 mg by nebulization 4 (four) times daily as needed. 360 mL 6  . CETIRIZINE HCL 10 MG PO TABS Oral Take 10 mg by mouth as needed.     Marland Kitchen DIAZEPAM 5 MG PO TABS Oral Take 5 mg by mouth. 1 daily as needed     . FLUTICASONE PROPIONATE 50 MCG/ACT NA SUSP Nasal Place 2 sprays into the nose daily as needed.     Marland Kitchen FLUTICASONE-SALMETEROL 250-50 MCG/DOSE IN AEPB Inhalation Inhale 1 puff into the lungs 2 (two) times daily. 1 each 6  . IBUPROFEN 100 MG PO TABS Oral Take 100 mg by mouth as needed.    Marland Kitchen LETROZOLE 2.5 MG PO TABS Oral Take 1 tablet (2.5 mg total) by mouth daily. 30 tablet 4  . LOVASTATIN 20 MG PO TABS  20  mg 1 by mouth daily    . METFORMIN HCL 1000 MG PO TABS Oral Take 1,000 mg by mouth 2 (two) times daily.     . WOMENS MULTIVITAMIN PLUS PO TABS Oral Take by mouth daily.    Marland Kitchen NAPROXEN SODIUM 220 MG PO CAPS Oral Take by mouth. As needed     . OMEPRAZOLE MAGNESIUM 20 MG PO TBEC Oral Take 20 mg by mouth daily.      . TERBINAFINE HCL 250 MG PO TABS  daily.       BP 134/64  Pulse 79  Temp 98 F (36.7 C) (Oral)  Resp 18  Ht 5\' 7"  (1.702 m)  Wt 166 lb 4 oz (75.411 kg)  BMI 26.04 kg/m2  SpO2 97%  Physical Exam  Nursing note and vitals reviewed. 71year old female, resting comfortably and in no acute distress. Vital signs are normal. Oxygen saturation is 97%, which is normal. Head is normocephalic and atraumatic. PERRLA, EOMI. Oropharynx is clear. Neck is nontender and supple without adenopathy or JVD. Back is nontender and there is no CVA tenderness. Lungs have scattered wheezes without rales or  rhonchi. Chest is nontender. Heart has regular rate and rhythm without murmur. Abdomen is soft, flat, nontender without masses or hepatosplenomegaly and peristalsis is normoactive. Extremities have no cyanosis or edema, full range of motion is present. Skin is warm and dry without rash. Neurologic: Mental status is normal, cranial nerves are intact, there are no motor or sensory deficits.   ED Course  Procedures (including critical care time)   1. Medication side effect   2. Anxiety         MDM  Symptoms are all known side effects of citalopram. Timing of all symptoms starting after first dose of citalopram strongly suggestive that this is a medication side effect. Patient is advised to discontinue taking citalopram. She normally takes diazepam at home for her anxiety she is advised that she can take it as often as 3 times a day. She is to follow up with her PCP next week to consider switching to a different antidepressant medication.        Dione Booze, MD 04/15/12 1136

## 2012-04-15 NOTE — ED Notes (Addendum)
Pt sts she started a new medication for depression last night and sts "it made me more anxious and confused". Pt sts she was having visual hallucinations last night but has since resolved. Pt sts she feels "jittery and just a little confused right now". Pt also sts she feels "flushed". Pt denies feeling more short of breath than "normal". Pt denies dizziness, pain, n/v. Pt sts she normally takes diazapam for anxiety but was afraid to take it due to reaction to other med.

## 2012-04-22 ENCOUNTER — Telehealth: Payer: Self-pay | Admitting: Hematology & Oncology

## 2012-04-22 ENCOUNTER — Ambulatory Visit (HOSPITAL_BASED_OUTPATIENT_CLINIC_OR_DEPARTMENT_OTHER): Payer: Medicare Other | Admitting: Medical

## 2012-04-22 VITALS — BP 139/59 | HR 74 | Temp 97.4°F | Resp 22 | Ht 67.0 in | Wt 165.0 lb

## 2012-04-22 DIAGNOSIS — J449 Chronic obstructive pulmonary disease, unspecified: Secondary | ICD-10-CM

## 2012-04-22 DIAGNOSIS — C50919 Malignant neoplasm of unspecified site of unspecified female breast: Secondary | ICD-10-CM

## 2012-04-22 DIAGNOSIS — R634 Abnormal weight loss: Secondary | ICD-10-CM

## 2012-04-22 DIAGNOSIS — C50419 Malignant neoplasm of upper-outer quadrant of unspecified female breast: Secondary | ICD-10-CM

## 2012-04-22 NOTE — Progress Notes (Signed)
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 #2 remote history of stage I infiltrating ductal carcinoma of the right breast, 2000, and 5. #3 unintentional weight loss.  Current therapy: Femara 2.5 mg by mouth daily.  Interim history: Ms.Vallee presents today for an office followup visit.  We saw her last back in July.  She was having some unintentional weight loss.  The etiology of this was unclear.  She is a heavy smoker.  She may possibly have some pulmonary cachexia issues from her COPD.  She continues to followup with Dr. Delford Field, her pulmonologist.  Her last CT scan of the chest was June of 2012, which did not reveal any abnormalities.  Her weight at her last visit was 172 pounds and, today she's 165 pounds.  She does have other health issues.  She does have diabetes.  She states she does notice a weight loss.  Started when she started Advair.  We did check her thyroid studies when she was here last, and they were completely normal.  She was seen by a physician at her primary care office, who informed her that she needed to increase her calorie count between 18 102,000 calories.  She, reports, that because of her diabetes.  She really wasn't eating much throughout the day.  She would need a couple pieces of toast for breakfast.  A pack of peanut butter crackers for line.  Each and then, a half of ham sandwich in the evening.  Overall, she was probably getting less than 1000 calories a day.  It sounds, like she's been doing this for quite some time.  In terms of her diabetes, her metformin has been cut back to half a pill a day.  Her blood sugars have been excellent.  Actually, they were running a bit on the lower side.  I think at this time to rule out any type of malignancy-related unintentional weight loss.  We can go ahead and set her up for CT scan of the chest, and abdomen.  She is in agreement with this. She denies any nausea, vomiting, diarrhea, constipation.  She  denies any chest pain, shortness of breath, cough, fevers, chills, or night sweats.  She denies any headaches, visual changes.  Any rashes.  She denies any palpable lymph nodes.  She denies any obvious, bleeding or bruising.  She denies any dysphasia or odynophagia.  Review of Systems: Pt. Denies any changes in their vision, hearing, adenopathy, fevers, chills, nausea, vomiting, diarrhea, constipation, chest pain, shortness of breath, passing blood, passing out, blacking out,  any changes in skin, joints, neurologic or psychiatric except as noted.  Physical Exam: This is a well-developed, well-nourished, 71 year old, white female, in no obvious distress Vitals: Temperature 97.4 degrees, pulse 74, respirations 22 blood pressure 139/59, and weight is 165 pounds HEENT reveals a normocephalic, atraumatic skull, no scleral icterus, no oral lesions  Neck is supple without any cervical or supraclavicular adenopathy.  Lungs are clear to auscultation bilaterally. There are no wheezes, rales or rhonci Cardiac is regular rate and rhythm with a normal S1 and S2. There are no murmurs, rubs, or bruits.  Abdomen is soft with good bowel sounds, there is no palpable mass. There is no palpable hepatosplenomegaly. There is no palpable fluid wave.  Musculoskeletal no tenderness of the spine, ribs, or hips.  Extremities there are no clubbing, cyanosis, or edema.  Skin no petechia, purpura or ecchymosis Neurologic is nonfocal. Breasts: Left breast with no masses,  edema, or erythema.  There is no left axillary adenopathy.  Right chest wall shows a well-healed mastectomy.  No right chest wall.  Nodules are noted.  There is no right axillary adenopathy.  Laboratory Data: None were taken today   Current Outpatient Prescriptions on File Prior to Visit  Medication Sig Dispense Refill  . albuterol (PROAIR HFA) 108 (90 BASE) MCG/ACT inhaler Inhale 2 puffs into the lungs every 4 (four) hours as needed for wheezing.  1  Inhaler  4  . albuterol (PROVENTIL) (2.5 MG/3ML) 0.083% nebulizer solution Take 2.5 mg by nebulization 4 (four) times daily as needed.  360 mL  6  . cetirizine (ZYRTEC) 10 MG tablet Take 10 mg by mouth as needed.       . citalopram (CELEXA) 20 MG tablet Take 20 mg by mouth daily.      . diazepam (VALIUM) 5 MG tablet Take 5 mg by mouth. 1 daily as needed       . fluticasone (FLONASE) 50 MCG/ACT nasal spray Place 2 sprays into the nose daily as needed.       . Fluticasone-Salmeterol (ADVAIR) 250-50 MCG/DOSE AEPB Inhale 1 puff into the lungs 2 (two) times daily.  1 each  6  . ibuprofen (ADVIL,MOTRIN) 100 MG tablet Take 100 mg by mouth as needed.      Marland Kitchen letrozole (FEMARA) 2.5 MG tablet Take 1 tablet (2.5 mg total) by mouth daily.  30 tablet  4  . lovastatin (MEVACOR) 20 MG tablet 20 mg 1 by mouth daily      . metFORMIN (GLUCOPHAGE) 1000 MG tablet Take 1,000 mg by mouth 2 (two) times daily.       . Multiple Vitamins-Minerals (WOMENS MULTIVITAMIN PLUS) TABS Take by mouth daily.      . Naproxen Sodium (ALEVE) 220 MG CAPS Take by mouth. As needed       . omeprazole (PRILOSEC OTC) 20 MG tablet Take 20 mg by mouth daily.        Marland Kitchen terbinafine (LAMISIL) 250 MG tablet daily.        Assessment/Plan:  This is a 71 year old, white female, with the following issues:  #1 stage II A., node-negative, ductal carcinoma of the right breast.  She was diagnosed back in 2009.  She remains on Femara.  We will continue her on Femara for a total of 5 years.  #2.  Unintentional  Weight loss -She is a very heavy smoker.  This could possibly be related to an underlying pulmonary cachexia type issue as well.  I think at this time, since she still continues to have weight los  without any clear etiology, we can go ahead and set her up to have a CT scan of the chest, and abdomen to rule out any type of malignancy-related unintentional weight loss.  This could be just do to very limited caloric intake, however, I feel more  comfortable going ahead with the scan.  #3 COPD-she will continue to followup with Dr. Delford Field.

## 2012-04-22 NOTE — Telephone Encounter (Signed)
Left pt message to call for details for 04-27-12 appointment

## 2012-04-27 ENCOUNTER — Other Ambulatory Visit: Payer: Self-pay | Admitting: Medical

## 2012-04-27 ENCOUNTER — Other Ambulatory Visit (HOSPITAL_BASED_OUTPATIENT_CLINIC_OR_DEPARTMENT_OTHER): Payer: Medicare Other | Admitting: Lab

## 2012-04-27 ENCOUNTER — Ambulatory Visit (HOSPITAL_BASED_OUTPATIENT_CLINIC_OR_DEPARTMENT_OTHER)
Admission: RE | Admit: 2012-04-27 | Discharge: 2012-04-27 | Disposition: A | Discharge status: Home / Self Care | Payer: Medicare Other | Source: Ambulatory Visit | Attending: Medical | Admitting: Medical

## 2012-04-27 DIAGNOSIS — R42 Dizziness and giddiness: Secondary | ICD-10-CM | POA: Insufficient documentation

## 2012-04-27 DIAGNOSIS — J984 Other disorders of lung: Secondary | ICD-10-CM | POA: Insufficient documentation

## 2012-04-27 DIAGNOSIS — R634 Abnormal weight loss: Secondary | ICD-10-CM | POA: Insufficient documentation

## 2012-04-27 DIAGNOSIS — R059 Cough, unspecified: Secondary | ICD-10-CM | POA: Insufficient documentation

## 2012-04-27 DIAGNOSIS — R05 Cough: Secondary | ICD-10-CM | POA: Insufficient documentation

## 2012-04-27 DIAGNOSIS — C50419 Malignant neoplasm of upper-outer quadrant of unspecified female breast: Secondary | ICD-10-CM

## 2012-04-27 DIAGNOSIS — R0602 Shortness of breath: Secondary | ICD-10-CM | POA: Insufficient documentation

## 2012-04-27 DIAGNOSIS — C50919 Malignant neoplasm of unspecified site of unspecified female breast: Secondary | ICD-10-CM

## 2012-04-27 DIAGNOSIS — I714 Abdominal aortic aneurysm, without rupture, unspecified: Secondary | ICD-10-CM | POA: Insufficient documentation

## 2012-04-27 DIAGNOSIS — J449 Chronic obstructive pulmonary disease, unspecified: Secondary | ICD-10-CM | POA: Insufficient documentation

## 2012-04-27 DIAGNOSIS — J4489 Other specified chronic obstructive pulmonary disease: Secondary | ICD-10-CM | POA: Insufficient documentation

## 2012-04-27 LAB — CBC WITH DIFFERENTIAL (CANCER CENTER ONLY)
BASO%: 0.8 % (ref 0.0–2.0)
EOS%: 2.7 % (ref 0.0–7.0)
HCT: 44 % (ref 34.8–46.6)
LYMPH%: 32.5 % (ref 14.0–48.0)
MCH: 32.6 pg (ref 26.0–34.0)
MCHC: 35.5 g/dL (ref 32.0–36.0)
MCV: 92 fL (ref 81–101)
MONO%: 11.8 % (ref 0.0–13.0)
NEUT%: 52.2 % (ref 39.6–80.0)
RDW: 12.9 % (ref 11.1–15.7)

## 2012-04-27 LAB — CMP (CANCER CENTER ONLY)
AST: 37 U/L (ref 11–38)
Alkaline Phosphatase: 104 U/L — ABNORMAL HIGH (ref 26–84)
BUN, Bld: 9 mg/dL (ref 7–22)
Creat: 0.9 mg/dl (ref 0.6–1.2)

## 2012-04-27 MED ORDER — IOHEXOL 300 MG/ML  SOLN
100.0000 mL | Freq: Once | INTRAMUSCULAR | Status: AC | PRN
  Administered 2012-04-27: 100 mL via INTRAVENOUS

## 2012-05-03 ENCOUNTER — Other Ambulatory Visit: Payer: Self-pay | Admitting: Medical

## 2012-05-12 ENCOUNTER — Other Ambulatory Visit: Payer: Self-pay | Admitting: Hematology & Oncology

## 2012-05-12 DIAGNOSIS — Z1231 Encounter for screening mammogram for malignant neoplasm of breast: Secondary | ICD-10-CM

## 2012-05-12 DIAGNOSIS — Z9011 Acquired absence of right breast and nipple: Secondary | ICD-10-CM

## 2012-05-12 DIAGNOSIS — Z853 Personal history of malignant neoplasm of breast: Secondary | ICD-10-CM

## 2012-05-13 ENCOUNTER — Ambulatory Visit: Payer: Medicare Other | Admitting: Hematology & Oncology

## 2012-05-17 ENCOUNTER — Other Ambulatory Visit: Payer: Self-pay | Admitting: Critical Care Medicine

## 2012-05-17 NOTE — Telephone Encounter (Signed)
Rx was given on 03/17/12.  Called [facility], spoke with Ethelene Browns who states pt called them to cancel this request stating she has a hand written rx.  Nothing further needed at this time.

## 2012-05-18 ENCOUNTER — Other Ambulatory Visit: Payer: Self-pay | Admitting: Family Medicine

## 2012-05-18 DIAGNOSIS — M8580 Other specified disorders of bone density and structure, unspecified site: Secondary | ICD-10-CM

## 2012-06-10 ENCOUNTER — Ambulatory Visit
Admission: RE | Admit: 2012-06-10 | Discharge: 2012-06-10 | Disposition: A | Discharge status: Home / Self Care | Payer: Medicare Other | Source: Ambulatory Visit | Attending: Family Medicine | Admitting: Family Medicine

## 2012-06-10 ENCOUNTER — Ambulatory Visit
Admission: RE | Admit: 2012-06-10 | Discharge: 2012-06-10 | Disposition: A | Discharge status: Home / Self Care | Payer: Medicare Other | Source: Ambulatory Visit | Attending: Hematology & Oncology | Admitting: Hematology & Oncology

## 2012-06-10 DIAGNOSIS — Z9011 Acquired absence of right breast and nipple: Secondary | ICD-10-CM

## 2012-06-10 DIAGNOSIS — Z853 Personal history of malignant neoplasm of breast: Secondary | ICD-10-CM

## 2012-06-10 DIAGNOSIS — Z1231 Encounter for screening mammogram for malignant neoplasm of breast: Secondary | ICD-10-CM

## 2012-06-10 DIAGNOSIS — M8580 Other specified disorders of bone density and structure, unspecified site: Secondary | ICD-10-CM

## 2012-06-14 ENCOUNTER — Other Ambulatory Visit: Payer: Self-pay | Admitting: Hematology & Oncology

## 2012-06-14 DIAGNOSIS — R928 Other abnormal and inconclusive findings on diagnostic imaging of breast: Secondary | ICD-10-CM

## 2012-06-21 ENCOUNTER — Ambulatory Visit
Admission: RE | Admit: 2012-06-21 | Discharge: 2012-06-21 | Disposition: A | Discharge status: Home / Self Care | Payer: Medicare Other | Source: Ambulatory Visit | Attending: Hematology & Oncology | Admitting: Hematology & Oncology

## 2012-06-21 DIAGNOSIS — R928 Other abnormal and inconclusive findings on diagnostic imaging of breast: Secondary | ICD-10-CM

## 2012-07-21 ENCOUNTER — Telehealth: Payer: Self-pay | Admitting: Hematology & Oncology

## 2012-07-21 NOTE — Telephone Encounter (Signed)
Pt canceled 12-27 moved it to 09-07-12

## 2012-07-22 ENCOUNTER — Ambulatory Visit: Payer: Medicare Other | Admitting: Hematology & Oncology

## 2012-07-22 ENCOUNTER — Other Ambulatory Visit: Payer: Medicare Other | Admitting: Lab

## 2012-08-08 ENCOUNTER — Other Ambulatory Visit: Payer: Self-pay | Admitting: Critical Care Medicine

## 2012-09-06 ENCOUNTER — Telehealth: Payer: Self-pay | Admitting: Hematology & Oncology

## 2012-09-06 NOTE — Telephone Encounter (Signed)
Patient called and cx 09/07/12 apt and resch for 09/14/12 °

## 2012-09-06 NOTE — Telephone Encounter (Signed)
Patient called and cx 09/07/12 apt and resch for 09/14/12

## 2012-09-07 ENCOUNTER — Other Ambulatory Visit: Payer: Medicare Other | Admitting: Lab

## 2012-09-07 ENCOUNTER — Ambulatory Visit: Payer: Medicare Other | Admitting: Hematology & Oncology

## 2012-09-14 ENCOUNTER — Other Ambulatory Visit (HOSPITAL_BASED_OUTPATIENT_CLINIC_OR_DEPARTMENT_OTHER): Payer: Medicare Other | Admitting: Lab

## 2012-09-14 ENCOUNTER — Ambulatory Visit (HOSPITAL_BASED_OUTPATIENT_CLINIC_OR_DEPARTMENT_OTHER): Payer: Medicare Other | Admitting: Hematology & Oncology

## 2012-09-14 VITALS — BP 139/69 | HR 79 | Temp 98.1°F | Resp 18 | Ht 67.0 in | Wt 167.0 lb

## 2012-09-14 DIAGNOSIS — C50419 Malignant neoplasm of upper-outer quadrant of unspecified female breast: Secondary | ICD-10-CM

## 2012-09-14 DIAGNOSIS — Z9010 Acquired absence of unspecified breast and nipple: Secondary | ICD-10-CM

## 2012-09-14 LAB — CBC WITH DIFFERENTIAL (CANCER CENTER ONLY)
BASO#: 0 10*3/uL (ref 0.0–0.2)
HCT: 42.3 % (ref 34.8–46.6)
HGB: 14.7 g/dL (ref 11.6–15.9)
LYMPH#: 1.6 10*3/uL (ref 0.9–3.3)
MCV: 93 fL (ref 81–101)
MONO#: 0.6 10*3/uL (ref 0.1–0.9)
NEUT%: 54.4 % (ref 39.6–80.0)
WBC: 5.5 10*3/uL (ref 3.9–10.0)

## 2012-09-14 LAB — COMPREHENSIVE METABOLIC PANEL
BUN: 12 mg/dL (ref 6–23)
CO2: 29 mEq/L (ref 19–32)
Calcium: 9.4 mg/dL (ref 8.4–10.5)
Chloride: 103 mEq/L (ref 96–112)
Creatinine, Ser: 0.66 mg/dL (ref 0.50–1.10)
Glucose, Bld: 172 mg/dL — ABNORMAL HIGH (ref 70–99)

## 2012-09-14 NOTE — Progress Notes (Signed)
This office note has been dictated.

## 2012-09-15 NOTE — Progress Notes (Signed)
CC:   Ashley Rainier, MD, Fax [phone]  DIAGNOSES: 1. 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 2. Past history of stage I ductal carcinoma of the right breast in     2000.  CURRENT THERAPY:  Femara 2.5 mg p.o. daily.  INTERIM HISTORY:  Ashley Savage comes in for her followup.  We last saw her back in September.  Since then she has been doing okay.  She has really had no complaints.  She is still smoking.  She is having a lot of problem with her right knee.  She is not sure if she wants to have surgery for this.  She has had no problems with nausea or vomiting.  She has had no increased cough or shortness breath.  She does have "COPD."  There has been no change in bowel or bladder habits.  PHYSICAL EXAMINATION:  General:  This is a fairly well-developed, well- nourished white female in no obvious distress.  Vital signs: Temperature of 98.1, pulse 79, respiratory rate 18, blood pressure 139/69.  Weight is 167.  Head and neck:  Normocephalic, atraumatic skull.  There are no ocular or oral lesions.  There are no palpable cervical or supraclavicular lymph nodes.  Lungs:  Clear bilaterally. Cardiac:  Regular rate and rhythm with a normal S1 and S2.  There are no murmurs, rubs, or bruits.  Breasts:  Left breast with no masses, edema, or erythema.  There is no left axillary adenopathy.  Right chest wall shows well-healed mastectomy.  No right chest wall nodules are noted. There is no right axillary adenopathy.  Abdomen:  Soft with good bowel sounds.  There is no palpable abdominal mass.  No palpable hepatosplenomegaly is noted.  Extremities:  Some slight swelling of the right knee.  She has some slight atrophy of the right thigh muscles. She has decent range of motion of her joints.  There is crepitus with motion of the right knee.  Back:  No kyphosis.  Neurological:  No focal neurological deficits.  LABORATORY STUDIES:  White cell count is 5.5, hemoglobin  14.7, hematocrit 42.3, platelet count 119.  Her last mammogram was in November 2013 was normal for the left breast.  IMPRESSION:  Ashley Savage is a 72 year old white female with history of a second breast primary of the right breast.  Her first breast cancer was back in 2005.  She started the Femara back in 2009.  I believe that 5 years would be appropriate.  We will get her through this year, then we will stop the Femara.  We will have her come back to see [place] in another 6 months or so.  If she does have knee surgery, I do not see any problems with her having this.  She does have a thrombocytopenia.  This has been slowly trending downward.  This could be medication-related.  We will have to just follow this.    ______________________________ Josph Macho, M.D. PRE/MEDQ  D:  09/14/2012  T:  09/15/2012  Job:  [id number]

## 2012-12-13 ENCOUNTER — Other Ambulatory Visit: Payer: Self-pay | Admitting: Critical Care Medicine

## 2013-01-17 ENCOUNTER — Telehealth: Payer: Self-pay | Admitting: Hematology & Oncology

## 2013-01-17 NOTE — Telephone Encounter (Signed)
Left pt message moved 10-20 to 10-17

## 2013-01-19 ENCOUNTER — Other Ambulatory Visit: Payer: Self-pay | Admitting: Hematology & Oncology

## 2013-02-02 ENCOUNTER — Ambulatory Visit (INDEPENDENT_AMBULATORY_CARE_PROVIDER_SITE_OTHER): Payer: Medicare Other | Admitting: Critical Care Medicine

## 2013-02-02 ENCOUNTER — Encounter: Payer: Self-pay | Admitting: Critical Care Medicine

## 2013-02-02 VITALS — BP 124/72 | HR 68 | Temp 98.0°F | Ht 66.5 in | Wt 164.0 lb

## 2013-02-02 DIAGNOSIS — J449 Chronic obstructive pulmonary disease, unspecified: Secondary | ICD-10-CM

## 2013-02-02 MED ORDER — CEFUROXIME AXETIL 250 MG PO TABS: 250.0000 mg | ORAL_TABLET | Freq: Two times a day (BID) | ORAL | Status: DC

## 2013-02-02 MED ORDER — PREDNISONE 10 MG PO TABS: ORAL_TABLET | ORAL | Status: DC

## 2013-02-02 MED ORDER — FLUTICASONE-SALMETEROL 250-50 MCG/DOSE IN AEPB: INHALATION_SPRAY | RESPIRATORY_TRACT | Status: DC

## 2013-02-02 NOTE — Patient Instructions (Addendum)
Prednisone pulse Ceftin for 5 days twice daily Stay on Advair one puff twice daily Return 4 months

## 2013-02-02 NOTE — Progress Notes (Signed)
Subjective:    [name], female    DOB: 1940/08/18, 72 y.o.   MRN: [id number]  HPI  72 y.o. F referred for Copd eval. Moved from [facility] 2008, saw Lung MD there.  No lung MD has yet seen the pt. Has been on same meds as before.  All family is in [place] Dx Copd many years.(2004) Still smokes 1PPDx58yrs Side effects with smoking cessation tools:  Patches: nightmares,  Nicotrol?helps,  chantix nausea   02/02/2013 Chief Complaint  Patient presents with  . Follow-up    Humidity affects breathing a lot.  Has DOE, wheezing at times, and cough with thick, brown mucus in the mornings and clear mucus in the evenings.  Has shakiness in hands and vision changes after each dose of advair.  Since last OV seems about the same.   Some wheezing is noted, off and on.  Notes shakiness with advair. No flares for ABX in past year Smokes: < 1/2PPD.    Past Medical History  Diagnosis Date  . Emphysema   . Diabetes mellitus type 2  . Hyperlipidemia   . Cancer breast ca  right  . Anxiety   . Panic attacks   . Depression      Family History  Problem Relation Age of Onset  . Heart failure Father   . Pneumonia Sister   . COPD Father   . Breast cancer    . Stroke Mother      History   Social History  . Marital Status: Single    Spouse Name: N/A    Number of Children: 0  . Years of Education: N/A   Occupational History  . retire    Social History Main Topics  . Smoking status: Current Every Day Smoker -- 0.40 packs/day    Types: Cigarettes  . Smokeless tobacco: Never Used     Comment: started smoking at age 56.   . Alcohol Use: Not on file  . Drug Use: No  . Sexually Active: Not on file   Other Topics Concern  . Not on file   Social History Narrative  . No narrative on file     Allergies  Allergen Reactions  . Citalopram     Confusion, irregular heart beat.  . Erythromycin     Stomach cramps     Outpatient Prescriptions Prior to Visit  Medication  Sig Dispense Refill  . albuterol (PROAIR HFA) 108 (90 BASE) MCG/ACT inhaler Inhale 2 puffs into the lungs every 4 (four) hours as needed for wheezing.  1 Inhaler  4  . albuterol (PROVENTIL) (2.5 MG/3ML) 0.083% nebulizer solution Inhale one vial via nebulizer four times daily as needed.  Dx code 496  360 mL  5  . Cholecalciferol (VITAMIN D3) 2000 UNITS TABS Take by mouth 2 (two) times daily.      . diazepam (VALIUM) 5 MG tablet Take 5 mg by mouth. 1 daily as needed       . lovastatin (MEVACOR) 20 MG tablet 20 mg 1 by mouth daily      . metFORMIN (GLUCOPHAGE) 1000 MG tablet Take 500 mg by mouth daily.       . Naproxen Sodium (ALEVE) 220 MG CAPS Take by mouth. As needed       . omeprazole (PRILOSEC OTC) 20 MG tablet Take 20 mg by mouth daily.        . TRUETEST TEST test strip as directed.       Marland Kitchen ADVAIR  DISKUS 250-50 MCG/DOSE AEPB INHALE ONE PUFF BY MOUTH TWICE DAILY  60 each  2  . letrozole (FEMARA) 2.5 MG tablet TAKE ONE TABLET BY MOUTH ONE TIME DAILY  30 tablet  0  . fluticasone (FLONASE) 50 MCG/ACT nasal spray Place 2 sprays into the nose daily as needed.       . cetirizine (ZYRTEC) 10 MG tablet Take 10 mg by mouth as needed.        No facility-administered medications prior to visit.     Review of Systems  Constitutional: Positive for fatigue. Negative for diaphoresis, activity change, appetite change and unexpected weight change.  HENT: Positive for congestion. Negative for hearing loss, nosebleeds, facial swelling, sneezing, mouth sores, trouble swallowing, neck stiffness, dental problem, voice change, sinus pressure, tinnitus and ear discharge.   Eyes: Negative for photophobia, discharge, itching and visual disturbance.  Respiratory: Negative for apnea, choking, chest tightness and stridor.   Cardiovascular: Negative for palpitations.  Gastrointestinal: Positive for constipation. Negative for nausea, blood in stool and abdominal distention.  Genitourinary: Negative for dysuria,  urgency, frequency, hematuria, flank pain, decreased urine volume and difficulty urinating.  Musculoskeletal: Negative for back pain, joint swelling, arthralgias and gait problem.  Skin: Negative for color change and pallor.  Neurological: Negative for dizziness, tremors, seizures, syncope, speech difficulty, weakness, light-headedness and numbness.  Hematological: Negative for adenopathy. Does not bruise/bleed easily.  Psychiatric/Behavioral: Negative for confusion, sleep disturbance and agitation. The patient is nervous/anxious.        Objective:   Physical Exam  Filed Vitals:   02/02/13 1118  BP: 124/72  Pulse: 68  Temp: 98 F (36.7 C)  TempSrc: Oral  Height: 5' 6.5" (1.689 m)  Weight: 164 lb (74.39 kg)  SpO2: 95%    Gen: Pleasant, well-nourished, in no distress,  normal affect  ENT: No lesions,  mouth clear,  oropharynx clear, no postnasal drip  Neck: No JVD, no TMG, no carotid bruits  Lungs: No use of accessory muscles, no dullness to percussion, exp wheezes   Cardiovascular: RRR, heart sounds normal, no murmur or gallops, no peripheral edema  Abdomen: soft and NT, no HSM,  BS normal  Musculoskeletal: No deformities, no cyanosis or clubbing  Neuro: alert, non focal  Skin: Warm, no lesions or rashes   CT Chest 01/15/11 Findings: There is centrilobular emphysema, mild. Both lungs are  clear. There are no osseous lesions. The right breast is absent.  There are no filling defects within either pulmonary arterial  system to suggest a segmental or subsegmental pulmonary embolus.  There is no axillary, hilar, mediastinal adenopathy. The thoracic  aorta has a normal caliber and appearance. The visualized upper  abdomen is unremarkable except for heterogeneous enhancement of the  spleen which may be related to phase of contrast versus cavernous  hemangioma.  Review of the MIP images confirms the above findings.  IMPRESSION:  Normal CT scan of the chest.   PFT  Conversion 03/11/2011  FVC 2.46  FVC PREDICT 3.24  FVC  % Predicted 76  FEV1 1.3  FEV1 PREDICT 2.36  FEV % Predicted 55  FEV1/FVC 52.8  FEV1/FVC PRE 72  FeF 25-75 0.49  FeF 25-75 % Predicted 19  Residual Volume (ml) 2.98  RV % EXPECT 139  DLCO (ml/mmHg sec) 9  DLCO % EXPEC 39  DLCO/VA 2.35  DLCO/VA%EXP 66      Assessment & Plan:   Chronic airway obstruction, not elsewhere classified Gold stage C. COPD with ongoing smoking use and  declined in lung function noted Plan Continued inhaled medications Advised smoking cessation Prednisone pulse Ceftin for 5 days twice daily Stay on Advair one puff twice daily Return 4 months     Updated Medication List Outpatient Encounter Prescriptions as of 02/02/2013  Medication Sig Dispense Refill  . albuterol (PROAIR HFA) 108 (90 BASE) MCG/ACT inhaler Inhale 2 puffs into the lungs every 4 (four) hours as needed for wheezing.  1 Inhaler  4  . albuterol (PROVENTIL) (2.5 MG/3ML) 0.083% nebulizer solution Inhale one vial via nebulizer four times daily as needed.  Dx code 496  360 mL  5  . Cholecalciferol (VITAMIN D3) 2000 UNITS TABS Take by mouth 2 (two) times daily.      . diazepam (VALIUM) 5 MG tablet Take 5 mg by mouth. 1 daily as needed       . Fluticasone-Salmeterol (ADVAIR DISKUS) 250-50 MCG/DOSE AEPB INHALE ONE PUFF BY MOUTH TWICE DAILY  60 each  11  . letrozole (FEMARA) 2.5 MG tablet       . lovastatin (MEVACOR) 20 MG tablet 20 mg 1 by mouth daily      . metFORMIN (GLUCOPHAGE) 1000 MG tablet Take 500 mg by mouth daily.       . Naproxen Sodium (ALEVE) 220 MG CAPS Take by mouth. As needed       . omeprazole (PRILOSEC OTC) 20 MG tablet Take 20 mg by mouth daily.        . TRUETEST TEST test strip as directed.       . [DISCONTINUED] ADVAIR DISKUS 250-50 MCG/DOSE AEPB INHALE ONE PUFF BY MOUTH TWICE DAILY  60 each  2  . [DISCONTINUED] letrozole (FEMARA) 2.5 MG tablet TAKE ONE TABLET BY MOUTH ONE TIME DAILY  30 tablet  0  . cefUROXime  (CEFTIN) 250 MG tablet Take 1 tablet (250 mg total) by mouth 2 (two) times daily.  10 tablet  0  . fluticasone (FLONASE) 50 MCG/ACT nasal spray Place 2 sprays into the nose daily as needed.       . predniSONE (DELTASONE) 10 MG tablet Take 4 tablets daily for 5 days then stop  20 tablet  0  . [DISCONTINUED] cetirizine (ZYRTEC) 10 MG tablet Take 10 mg by mouth as needed.        No facility-administered encounter medications on file as of 02/02/2013.

## 2013-02-02 NOTE — Assessment & Plan Note (Signed)
Gold stage C. COPD with ongoing smoking use and declined in lung function noted Plan Continued inhaled medications Advised smoking cessation Prednisone pulse Ceftin for 5 days twice daily Stay on Advair one puff twice daily Return 4 months

## 2013-02-08 LAB — HM DIABETES EYE EXAM

## 2013-03-21 ENCOUNTER — Other Ambulatory Visit: Payer: Self-pay | Admitting: *Deleted

## 2013-03-21 DIAGNOSIS — N951 Menopausal and female climacteric states: Secondary | ICD-10-CM

## 2013-03-21 DIAGNOSIS — C50919 Malignant neoplasm of unspecified site of unspecified female breast: Secondary | ICD-10-CM

## 2013-03-22 ENCOUNTER — Other Ambulatory Visit: Payer: Self-pay | Admitting: *Deleted

## 2013-03-22 MED ORDER — MEGESTROL ACETATE 20 MG PO TABS: 20.0000 mg | ORAL_TABLET | Freq: Every day | ORAL | Status: DC

## 2013-03-22 NOTE — Telephone Encounter (Signed)
error 

## 2013-03-22 NOTE — Telephone Encounter (Signed)
Pt called yesterday with c/o worsening hot flashes. She can get them up to 10-40 times per day. She called the [facility] line over the weekend. The RN had her check her blood sugars and O2 sats checked which the pt reports were ok. She then instructed her to stop the Femara and call the office. The pt noticed a difference in her hot flashes after not taking it for a day or two. Reviewed with Dr Myna Hidalgo. To start Megace 20 mg once daily and resume Femara. She will call back if the symptoms recur. Ms. Enterline verbalized understanding of her instructions.

## 2013-03-31 ENCOUNTER — Other Ambulatory Visit: Payer: Self-pay | Admitting: *Deleted

## 2013-03-31 MED ORDER — LETROZOLE 2.5 MG PO TABS: 2.5000 mg | ORAL_TABLET | Freq: Every day | ORAL | Status: DC

## 2013-05-12 ENCOUNTER — Other Ambulatory Visit: Payer: Medicare Other | Admitting: Lab

## 2013-05-12 ENCOUNTER — Telehealth: Payer: Self-pay | Admitting: Hematology & Oncology

## 2013-05-12 ENCOUNTER — Ambulatory Visit: Payer: Medicare Other | Admitting: Hematology & Oncology

## 2013-05-12 NOTE — Telephone Encounter (Signed)
Patient called and due to flat tire patient had to cx 10/17 apt and resch for 10/27

## 2013-05-15 ENCOUNTER — Ambulatory Visit: Payer: Medicare Other | Admitting: Medical

## 2013-05-15 ENCOUNTER — Other Ambulatory Visit: Payer: Medicare Other | Admitting: Lab

## 2013-05-22 ENCOUNTER — Other Ambulatory Visit (HOSPITAL_BASED_OUTPATIENT_CLINIC_OR_DEPARTMENT_OTHER): Payer: Medicare Other | Admitting: Lab

## 2013-05-22 ENCOUNTER — Ambulatory Visit (HOSPITAL_BASED_OUTPATIENT_CLINIC_OR_DEPARTMENT_OTHER): Payer: Medicare Other | Admitting: Hematology & Oncology

## 2013-05-22 VITALS — BP 140/60 | HR 85 | Temp 97.4°F | Resp 16 | Ht 65.0 in | Wt 163.0 lb

## 2013-05-22 DIAGNOSIS — F17200 Nicotine dependence, unspecified, uncomplicated: Secondary | ICD-10-CM

## 2013-05-22 DIAGNOSIS — D696 Thrombocytopenia, unspecified: Secondary | ICD-10-CM

## 2013-05-22 DIAGNOSIS — C50419 Malignant neoplasm of upper-outer quadrant of unspecified female breast: Secondary | ICD-10-CM

## 2013-05-22 DIAGNOSIS — Z853 Personal history of malignant neoplasm of breast: Secondary | ICD-10-CM

## 2013-05-22 DIAGNOSIS — C50919 Malignant neoplasm of unspecified site of unspecified female breast: Secondary | ICD-10-CM

## 2013-05-22 LAB — CBC WITH DIFFERENTIAL (CANCER CENTER ONLY)
BASO#: 0 10*3/uL (ref 0.0–0.2)
BASO%: 0.8 % (ref 0.0–2.0)
HCT: 41.4 % (ref 34.8–46.6)
HGB: 14.1 g/dL (ref 11.6–15.9)
LYMPH#: 1.6 10*3/uL (ref 0.9–3.3)
MONO#: 0.6 10*3/uL (ref 0.1–0.9)
NEUT%: 53.8 % (ref 39.6–80.0)
RBC: 4.43 10*6/uL (ref 3.70–5.32)
RDW: 13 % (ref 11.1–15.7)
WBC: 5.1 10*3/uL (ref 3.9–10.0)

## 2013-05-22 LAB — COMPREHENSIVE METABOLIC PANEL
ALT: 17 U/L (ref 0–35)
CO2: 29 mEq/L (ref 19–32)
Calcium: 9.5 mg/dL (ref 8.4–10.5)
Chloride: 102 mEq/L (ref 96–112)
Creatinine, Ser: 0.76 mg/dL (ref 0.50–1.10)
Total Protein: 6.1 g/dL (ref 6.0–8.3)

## 2013-05-22 NOTE — Progress Notes (Signed)
This office note has been dictated.

## 2013-05-23 NOTE — Progress Notes (Signed)
DIAGNOSES: 1. 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 2. Remote history of stage I ductal carcinoma of the right breast-     2000. 3. Thrombocytopenia.  CURRENT THERAPY:  Femara 2.5 mg p.o. daily.  INTERVAL HISTORY:  Ashley Savage comes in for followup.  We last saw her back in February.  Since then, she has had no specific issues.  She does have knee problems.  It does not look like she is going to have any surgery for this because of, I think, underlying lung issues.  She continues to smoke.  She was on Megace for hot flashes.  She now is off this.  The patient states she takes prednisone on occasion, for I think COPD.  She had her metformin dose decreased because of low blood sugars. She has had no problems with bleeding or bruising.  PHYSICAL EXAMINATION:  General:  This is a well-developed, well- nourished white female in no obvious distress.  Vital Signs: Temperature 97.4, pulse 75, respiratory rate 16, blood pressure 140/60. Weight is 163 pounds.  Head and Neck:  Normocephalic, atraumatic skull. There are no ocular or oral lesions.  She has no palpable cervical or supraclavicular lymph nodes.  Lungs:  Clear bilaterally.  Cardiac: Regular rate and rhythm with a normal S1, S2.  There are no murmurs, rubs or bruits.  Breasts:  Shows left breast with no masses, edema or erythema.  There is no left axillary adenopathy.  Right chest wall shows well-healed mastectomy.  There is no right chest wall nodules.  She has no erythema.  There is no right axillary adenopathy.  Abdomen:  Soft. She has good bowel sounds.  There is no fluid wave.  There is no palpable abdominal mass.  There is no palpable hepatosplenomegaly. Extremities:  Show no clubbing, cyanosis or edema.  She has no lymphedema in the right arm.  Skin:  No rashes, ecchymoses or petechia. Neurological:  No focal neurological deficits.  LABORATORY STUDIES:  White cell count is 5.1, hemoglobin 14,  hematocrit 41, platelet count 104.  MCV is 94.  Peripheral smear shows slight decrease in platelets.  Platelets are well granulated.  She has no nucleated red blood cells.  There is no rouleaux formation.  She has no hypersegmented polys.  There is no immature myeloid cells.  IMPRESSION:  Ashley Savage is a 72 year old white female with history of stage IIA infiltrating ductal carcinoma of the right breast.  She is on Femara right now.  She will finish Femara in February of 2015.  The thrombocytopenia is interesting.  This is progressing.  By a CT scan she had back in October of last year, she was found have some cirrhotic changes.  We will go ahead and plan to get her back in another 4 months.  If her platelet count continues to decline, she may need to have a bone marrow test.  Again, we will stop the Femara in February 2015.    ______________________________ Josph Macho, M.D. PRE/MEDQ  D:  05/22/2013  T:  05/23/2013  Job:  [id number]

## 2013-05-31 ENCOUNTER — Other Ambulatory Visit: Payer: Self-pay | Admitting: Family Medicine

## 2013-05-31 DIAGNOSIS — I714 Abdominal aortic aneurysm, without rupture: Secondary | ICD-10-CM

## 2013-06-06 ENCOUNTER — Ambulatory Visit
Admission: RE | Admit: 2013-06-06 | Discharge: 2013-06-06 | Disposition: A | Discharge status: Home / Self Care | Payer: Medicare Other | Source: Ambulatory Visit | Attending: Family Medicine | Admitting: Family Medicine

## 2013-06-06 DIAGNOSIS — I714 Abdominal aortic aneurysm, without rupture: Secondary | ICD-10-CM

## 2013-06-12 ENCOUNTER — Ambulatory Visit (INDEPENDENT_AMBULATORY_CARE_PROVIDER_SITE_OTHER): Payer: Medicare Other | Admitting: Critical Care Medicine

## 2013-06-12 ENCOUNTER — Encounter: Payer: Self-pay | Admitting: Critical Care Medicine

## 2013-06-12 VITALS — BP 130/64 | HR 77 | Temp 98.3°F | Ht 66.5 in | Wt 168.0 lb

## 2013-06-12 DIAGNOSIS — J449 Chronic obstructive pulmonary disease, unspecified: Secondary | ICD-10-CM

## 2013-06-12 NOTE — Patient Instructions (Signed)
No change in medications. Return in         4 months 

## 2013-06-12 NOTE — Progress Notes (Signed)
Subjective:    [name], female    DOB: March 07, 1941, 72 y.o.   MRN: [id number]  HPI  72 y.o. F referred for Copd eval. Moved from [facility], saw Lung MD there.  No lung MD has yet seen the pt. Has been on same meds as before.  All family is in [place] Dx Copd many years.(2004) Still smokes 1PPDx24yrs Side effects with smoking cessation tools:  Patches: nightmares,  Nicotrol?helps,  chantix nausea    06/12/2013 Chief Complaint  Patient presents with  . 4 month follow up    Breathing is unchanged.  Has SOB when first getting up in the morning along with nasal and chest congestion with clear to white mucus, runny nose, and some whezing.    Pt still smoking >1PPD.  Pt still with dyspnea.  Pt remains depressed.   Pt denies any significant sore throat, nasal congestion or excess secretions, fever, chills, sweats, unintended weight loss, pleurtic or exertional chest pain, orthopnea PND, or leg swelling Pt denies any increase in rescue therapy over baseline, denies waking up needing it or having any early am or nocturnal exacerbations of coughing/wheezing/or dyspnea. Pt also denies any obvious fluctuation in symptoms with  weather or environmental change or other alleviating or aggravating factors     Past Medical History  Diagnosis Date  . Emphysema   . Diabetes mellitus type 2  . Hyperlipidemia   . Cancer breast ca  right  . Anxiety   . Panic attacks   . Depression      Family History  Problem Relation Age of Onset  . Heart failure Father   . Pneumonia Sister   . COPD Father   . Breast cancer    . Stroke Mother      History   Social History  . Marital Status: Single    Spouse Name: N/A    Number of Children: 0  . Years of Education: N/A   Occupational History  . retire    Social History Main Topics  . Smoking status: Current Every Day Smoker -- 0.30 packs/day    Types: Cigarettes    Start date: 07/27/1964  . Smokeless tobacco: Never Used      Comment: started smoking at age 89.   . Alcohol Use: Not on file  . Drug Use: No  . Sexual Activity: Not on file   Other Topics Concern  . Not on file   Social History Narrative  . No narrative on file     Allergies  Allergen Reactions  . Citalopram     Confusion, irregular heart beat.  . Erythromycin     Stomach cramps     Outpatient Prescriptions Prior to Visit  Medication Sig Dispense Refill  . albuterol (PROAIR HFA) 108 (90 BASE) MCG/ACT inhaler Inhale 2 puffs into the lungs every 4 (four) hours as needed for wheezing.  1 Inhaler  4  . albuterol (PROVENTIL) (2.5 MG/3ML) 0.083% nebulizer solution Inhale one vial via nebulizer four times daily as needed.  Dx code 496  360 mL  5  . Cholecalciferol (VITAMIN D3) 2000 UNITS TABS Take by mouth 2 (two) times daily.      . diazepam (VALIUM) 5 MG tablet Take 5 mg by mouth. 1 daily as needed       . Fluticasone-Salmeterol (ADVAIR DISKUS) 250-50 MCG/DOSE AEPB INHALE ONE PUFF BY MOUTH TWICE DAILY  60 each  11  . letrozole (FEMARA) 2.5 MG tablet Take 1 tablet (  2.5 mg total) by mouth daily.  30 tablet  12  . lovastatin (MEVACOR) 20 MG tablet 20 mg 1 by mouth daily      . metFORMIN (GLUCOPHAGE) 1000 MG tablet Take by mouth. 250 mg in am and 500 mg in the evening      . Naproxen Sodium (ALEVE) 220 MG CAPS Take by mouth. As needed       . TRUETEST TEST test strip as directed.       . fluticasone (FLONASE) 50 MCG/ACT nasal spray Place 2 sprays into the nose daily as needed.       Marland Kitchen omeprazole (PRILOSEC OTC) 20 MG tablet Take 20 mg by mouth daily.        . cefUROXime (CEFTIN) 250 MG tablet Take 1 tablet (250 mg total) by mouth 2 (two) times daily.  10 tablet  0   No facility-administered medications prior to visit.     Review of Systems  Constitutional: Positive for fatigue. Negative for diaphoresis, activity change, appetite change and unexpected weight change.  HENT: Positive for congestion. Negative for dental problem, ear  discharge, facial swelling, hearing loss, mouth sores, nosebleeds, sinus pressure, sneezing, tinnitus, trouble swallowing and voice change.   Eyes: Negative for photophobia, discharge, itching and visual disturbance.  Respiratory: Negative for apnea, choking, chest tightness and stridor.   Cardiovascular: Negative for palpitations.  Gastrointestinal: Positive for constipation. Negative for nausea, blood in stool and abdominal distention.  Genitourinary: Negative for dysuria, urgency, frequency, hematuria, flank pain, decreased urine volume and difficulty urinating.  Musculoskeletal: Negative for arthralgias, back pain, gait problem, joint swelling and neck stiffness.  Skin: Negative for color change and pallor.  Neurological: Negative for dizziness, tremors, seizures, syncope, speech difficulty, weakness, light-headedness and numbness.  Hematological: Negative for adenopathy. Does not bruise/bleed easily.  Psychiatric/Behavioral: Negative for confusion, sleep disturbance and agitation. The patient is nervous/anxious.        Objective:   Physical Exam  Filed Vitals:   06/12/13 1521  BP: 130/64  Pulse: 77  Temp: 98.3 F (36.8 C)  TempSrc: Oral  Height: 5' 6.5" (1.689 m)  Weight: 168 lb (76.204 kg)  SpO2: 95%    Gen: Pleasant, well-nourished, in no distress,  normal affect  ENT: No lesions,  mouth clear,  oropharynx clear, no postnasal drip  Neck: No JVD, no TMG, no carotid bruits  Lungs: No use of accessory muscles, no dullness to percussion, distant breath sounds  Cardiovascular: RRR, heart sounds normal, no murmur or gallops, no peripheral edema  Abdomen: soft and NT, no HSM,  BS normal  Musculoskeletal: No deformities, no cyanosis or clubbing  Neuro: alert, non focal  Skin: Warm, no lesions or rashes      Assessment & Plan:   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 Chronic airway obstruction due to to COPD gold stage C. with  associated emphysema and ongoing smoking use Plan No change in inhaled medications    Updated Medication List Outpatient Encounter Prescriptions as of 06/12/2013  Medication Sig  . albuterol (PROAIR HFA) 108 (90 BASE) MCG/ACT inhaler Inhale 2 puffs into the lungs every 4 (four) hours as needed for wheezing.  Marland Kitchen albuterol (PROVENTIL) (2.5 MG/3ML) 0.083% nebulizer solution Inhale one vial via nebulizer four times daily as needed.  Dx code 38  . Cholecalciferol (VITAMIN D3) 2000 UNITS TABS Take by mouth 2 (two) times daily.  . diazepam (VALIUM) 5 MG tablet Take 5 mg by mouth. 1 daily as needed   .  Fluticasone-Salmeterol (ADVAIR DISKUS) 250-50 MCG/DOSE AEPB INHALE ONE PUFF BY MOUTH TWICE DAILY  . letrozole (FEMARA) 2.5 MG tablet Take 1 tablet (2.5 mg total) by mouth daily.  Marland Kitchen lovastatin (MEVACOR) 20 MG tablet 20 mg 1 by mouth daily  . metFORMIN (GLUCOPHAGE) 1000 MG tablet Take by mouth. 250 mg in am and 500 mg in the evening  . Naproxen Sodium (ALEVE) 220 MG CAPS Take by mouth. As needed   . TRUETEST TEST test strip as directed.   . fluticasone (FLONASE) 50 MCG/ACT nasal spray Place 2 sprays into the nose daily as needed.   Marland Kitchen omeprazole (PRILOSEC OTC) 20 MG tablet Take 20 mg by mouth daily.    . [DISCONTINUED] cefUROXime (CEFTIN) 250 MG tablet Take 1 tablet (250 mg total) by mouth 2 (two) times daily.

## 2013-06-12 NOTE — Assessment & Plan Note (Signed)
Chronic airway obstruction due to to COPD gold stage C. with associated emphysema and ongoing smoking use Plan No change in inhaled medications

## 2013-07-13 ENCOUNTER — Telehealth: Payer: Self-pay | Admitting: *Deleted

## 2013-07-13 ENCOUNTER — Other Ambulatory Visit: Payer: Self-pay | Admitting: Hematology & Oncology

## 2013-07-13 DIAGNOSIS — Z1231 Encounter for screening mammogram for malignant neoplasm of breast: Secondary | ICD-10-CM

## 2013-07-13 MED ORDER — FLUTICASONE-SALMETEROL 250-50 MCG/DOSE IN AEPB: INHALATION_SPRAY | RESPIRATORY_TRACT | Status: DC

## 2013-07-13 NOTE — Telephone Encounter (Signed)
Received call from Marg at [facility] office. Pt is there now requesting advair 250 sample.  We do not have advair 250 at the [facility] office.  Pt has enough Advair to last until Next Tuesday when PW is in [facility].  Advised Marg I would bring 1 sample of the Advair 250 to [facility] office on Tuesday evening with PW is out there.  She verbalized understanding and will inform pt.

## 2013-07-14 ENCOUNTER — Ambulatory Visit (HOSPITAL_BASED_OUTPATIENT_CLINIC_OR_DEPARTMENT_OTHER)
Admission: RE | Admit: 2013-07-14 | Discharge: 2013-07-14 | Disposition: A | Discharge status: Home / Self Care | Payer: Medicare Other | Source: Ambulatory Visit | Attending: Hematology & Oncology | Admitting: Hematology & Oncology

## 2013-07-14 ENCOUNTER — Ambulatory Visit (HOSPITAL_BASED_OUTPATIENT_CLINIC_OR_DEPARTMENT_OTHER): Payer: Medicare Other

## 2013-07-14 DIAGNOSIS — Z1231 Encounter for screening mammogram for malignant neoplasm of breast: Secondary | ICD-10-CM | POA: Insufficient documentation

## 2013-09-18 ENCOUNTER — Emergency Department (HOSPITAL_BASED_OUTPATIENT_CLINIC_OR_DEPARTMENT_OTHER)
Admission: EM | Admit: 2013-09-18 | Discharge: 2013-09-19 | Disposition: A | Discharge status: Home / Self Care | Payer: Medicare HMO | Attending: Emergency Medicine | Admitting: Emergency Medicine

## 2013-09-18 ENCOUNTER — Other Ambulatory Visit: Payer: Self-pay

## 2013-09-18 ENCOUNTER — Encounter (HOSPITAL_BASED_OUTPATIENT_CLINIC_OR_DEPARTMENT_OTHER): Payer: Self-pay | Admitting: Emergency Medicine

## 2013-09-18 ENCOUNTER — Emergency Department (HOSPITAL_BASED_OUTPATIENT_CLINIC_OR_DEPARTMENT_OTHER): Payer: Medicare HMO

## 2013-09-18 DIAGNOSIS — J441 Chronic obstructive pulmonary disease with (acute) exacerbation: Secondary | ICD-10-CM | POA: Insufficient documentation

## 2013-09-18 DIAGNOSIS — E119 Type 2 diabetes mellitus without complications: Secondary | ICD-10-CM | POA: Insufficient documentation

## 2013-09-18 DIAGNOSIS — Z79899 Other long term (current) drug therapy: Secondary | ICD-10-CM | POA: Insufficient documentation

## 2013-09-18 DIAGNOSIS — Z853 Personal history of malignant neoplasm of breast: Secondary | ICD-10-CM | POA: Insufficient documentation

## 2013-09-18 DIAGNOSIS — D696 Thrombocytopenia, unspecified: Secondary | ICD-10-CM | POA: Insufficient documentation

## 2013-09-18 DIAGNOSIS — F17200 Nicotine dependence, unspecified, uncomplicated: Secondary | ICD-10-CM | POA: Insufficient documentation

## 2013-09-18 DIAGNOSIS — F410 Panic disorder [episodic paroxysmal anxiety] without agoraphobia: Secondary | ICD-10-CM | POA: Insufficient documentation

## 2013-09-18 DIAGNOSIS — IMO0002 Reserved for concepts with insufficient information to code with codable children: Secondary | ICD-10-CM | POA: Insufficient documentation

## 2013-09-18 DIAGNOSIS — F3289 Other specified depressive episodes: Secondary | ICD-10-CM | POA: Insufficient documentation

## 2013-09-18 DIAGNOSIS — E785 Hyperlipidemia, unspecified: Secondary | ICD-10-CM | POA: Insufficient documentation

## 2013-09-18 DIAGNOSIS — F329 Major depressive disorder, single episode, unspecified: Secondary | ICD-10-CM | POA: Insufficient documentation

## 2013-09-18 LAB — CBC WITH DIFFERENTIAL/PLATELET
BASOS PCT: 0 % (ref 0–1)
Basophils Absolute: 0 10*3/uL (ref 0.0–0.1)
EOS ABS: 0.2 10*3/uL (ref 0.0–0.7)
EOS PCT: 3 % (ref 0–5)
HCT: 40.2 % (ref 36.0–46.0)
HEMOGLOBIN: 13.8 g/dL (ref 12.0–15.0)
Lymphocytes Relative: 17 % (ref 12–46)
Lymphs Abs: 1 10*3/uL (ref 0.7–4.0)
MCH: 32.3 pg (ref 26.0–34.0)
MCHC: 34.3 g/dL (ref 30.0–36.0)
MCV: 94.1 fL (ref 78.0–100.0)
MONOS PCT: 15 % — AB (ref 3–12)
Monocytes Absolute: 0.9 10*3/uL (ref 0.1–1.0)
Neutro Abs: 3.7 10*3/uL (ref 1.7–7.7)
Neutrophils Relative %: 64 % (ref 43–77)
Platelets: 119 10*3/uL — ABNORMAL LOW (ref 150–400)
RBC: 4.27 MIL/uL (ref 3.87–5.11)
RDW: 13 % (ref 11.5–15.5)
WBC: 5.8 10*3/uL (ref 4.0–10.5)

## 2013-09-18 LAB — COMPREHENSIVE METABOLIC PANEL
ALBUMIN: 3.3 g/dL — AB (ref 3.5–5.2)
ALT: 27 U/L (ref 0–35)
AST: 43 U/L — ABNORMAL HIGH (ref 0–37)
Alkaline Phosphatase: 139 U/L — ABNORMAL HIGH (ref 39–117)
BUN: 14 mg/dL (ref 6–23)
CO2: 26 mEq/L (ref 19–32)
Calcium: 9.2 mg/dL (ref 8.4–10.5)
Chloride: 100 mEq/L (ref 96–112)
Creatinine, Ser: 0.8 mg/dL (ref 0.50–1.10)
GFR calc Af Amer: 85 mL/min — ABNORMAL LOW (ref >90)
GFR calc non Af Amer: 73 mL/min — ABNORMAL LOW (ref >90)
GLUCOSE: 136 mg/dL — AB (ref 70–99)
Potassium: 4.2 mEq/L (ref 3.7–5.3)
Sodium: 139 mEq/L (ref 137–147)
TOTAL PROTEIN: 6.4 g/dL (ref 6.0–8.3)
Total Bilirubin: 0.9 mg/dL (ref 0.3–1.2)

## 2013-09-18 MED ORDER — SODIUM CHLORIDE 0.9 % IV BOLUS (SEPSIS)
1000.0000 mL | Freq: Once | INTRAVENOUS | Status: DC
  Administered 2013-09-18: 1000 mL via INTRAVENOUS

## 2013-09-18 MED ORDER — SODIUM CHLORIDE 0.9 % IV SOLN
Freq: Once | INTRAVENOUS | Status: AC
  Administered 2013-09-18: 23:00:00 via INTRAVENOUS

## 2013-09-18 MED ORDER — PREDNISONE 20 MG PO TABS: 40.0000 mg | ORAL_TABLET | Freq: Every day | ORAL | Status: DC

## 2013-09-18 MED ORDER — LEVOFLOXACIN 500 MG PO TABS: 500.0000 mg | ORAL_TABLET | Freq: Every day | ORAL | Status: DC

## 2013-09-18 MED ORDER — ALBUTEROL SULFATE (2.5 MG/3ML) 0.083% IN NEBU
5.0000 mg | INHALATION_SOLUTION | Freq: Once | RESPIRATORY_TRACT | Status: AC
  Administered 2013-09-18: 5 mg via RESPIRATORY_TRACT
  Filled 2013-09-18: qty 6

## 2013-09-18 MED ORDER — LEVOFLOXACIN IN D5W 500 MG/100ML IV SOLN
500.0000 mg | Freq: Once | INTRAVENOUS | Status: AC
  Administered 2013-09-18: 500 mg via INTRAVENOUS
  Filled 2013-09-18: qty 100

## 2013-09-18 MED ORDER — METHYLPREDNISOLONE SODIUM SUCC 125 MG IJ SOLR
125.0000 mg | INTRAMUSCULAR | Status: AC
  Administered 2013-09-18: 125 mg via INTRAVENOUS
  Filled 2013-09-18: qty 2

## 2013-09-18 NOTE — ED Notes (Signed)
Pt reports currently taking prednisone and also has Rx for po Zithromax that she has not filled.

## 2013-09-18 NOTE — ED Provider Notes (Signed)
CSN: [id number]     Arrival date & time 09/18/13  1952 History  This chart was scribed for Carmin Muskrat, MD by Adriana Reams, ED Scribe. This patient was seen in room MH01/MH01 and the patient's care was started at 2145.    First MD Initiated Contact with Patient 09/18/13 2145     Chief Complaint  Patient presents with  . Shortness of Breath      The history is provided by the patient and a relative. No language interpreter was used.   HPI Comments: Ashley Savage is a 73 y.o. female who presents to the Emergency Department complaining of 3 days of exacerbation of her chronic cough. She states the cough began as dry, but became productive today with clear sputum. She reports associated congestion, rhinorrhea, SOB (baseline) and dry lips. She denies nausea, vomiting, diarrhea, leg swelling, rash, fever, CP or any other symptoms. She was seen by UC yesterday where she was prescribed a Z-pack and prednisone, but she did not fill the prescriptions. She reports Emphysema, DM, HLD, breast cancer and masectomy, anxiety and depression as her significant past medical history.   Past Medical History  Diagnosis Date  . Emphysema   . Diabetes mellitus type 2  . Hyperlipidemia   . Cancer breast ca  right  . Anxiety   . Panic attacks   . Depression    Past Surgical History  Procedure Laterality Date  . Gallbladder surgery  1992  . Breast surgery  2009 right  . Appendectomy  2007  . Knee surgery    . Mandible fracture surgery    . Pilonidal cyst excision    . Tonsillectomy     Family History  Problem Relation Age of Onset  . Heart failure Father   . Pneumonia Sister   . COPD Father   . Breast cancer    . Stroke Mother    History  Substance Use Topics  . Smoking status: Current Every Day Smoker -- 0.50 packs/day    Types: Cigarettes    Start date: 07/27/1964  . Smokeless tobacco: Never Used     Comment: started smoking at age 73.   . Alcohol Use: No   OB History   Grav Para  Term Preterm Abortions TAB SAB Ect Mult Living                 Review of Systems  Constitutional:       Per HPI, otherwise negative  HENT:       Per HPI, otherwise negative  Respiratory:       Per HPI, otherwise negative  Cardiovascular:       Per HPI, otherwise negative  Gastrointestinal: Negative for vomiting.  Endocrine:       Negative aside from HPI  Genitourinary:       Neg aside from HPI   Musculoskeletal:       Per HPI, otherwise negative  Skin: Negative.   Neurological: Negative for syncope.      Allergies  Citalopram and Erythromycin  Home Medications   Current Outpatient Rx  Name  Route  Sig  Dispense  Refill  . albuterol (PROAIR HFA) 108 (90 BASE) MCG/ACT inhaler   Inhalation   Inhale 2 puffs into the lungs every 4 (four) hours as needed for wheezing.   1 Inhaler   4   . albuterol (PROVENTIL) (2.5 MG/3ML) 0.083% nebulizer solution      Inhale one vial via nebulizer four times daily as needed.  Dx code 496   360 mL   5   . Cholecalciferol (VITAMIN D3) 2000 UNITS TABS   Oral   Take by mouth 2 (two) times daily.         . diazepam (VALIUM) 5 MG tablet   Oral   Take 5 mg by mouth. 1 daily as needed          . fluticasone (FLONASE) 50 MCG/ACT nasal spray   Nasal   Place 2 sprays into the nose daily as needed.          . Fluticasone-Salmeterol (ADVAIR DISKUS) 250-50 MCG/DOSE AEPB      INHALE ONE PUFF BY MOUTH TWICE DAILY   14 each   0   . letrozole (FEMARA) 2.5 MG tablet   Oral   Take 1 tablet (2.5 mg total) by mouth daily.   30 tablet   12     Generic please!   Marland Kitchen lovastatin (MEVACOR) 20 MG tablet      20 mg 1 by mouth daily         . metFORMIN (GLUCOPHAGE) 1000 MG tablet   Oral   Take by mouth. 250 mg in am and 500 mg in the evening         . Naproxen Sodium (ALEVE) 220 MG CAPS   Oral   Take by mouth. As needed          . omeprazole (PRILOSEC OTC) 20 MG tablet   Oral   Take 20 mg by mouth daily.           .  TRUETEST TEST test strip      as directed.           BP 116/51  Pulse 96  Temp(Src) 99.2 F (37.3 C) (Oral)  Resp 24  Ht 5\' 6"  (1.676 m)  Wt 170 lb (77.111 kg)  BMI 27.45 kg/m2  SpO2 93%  Physical Exam  Nursing note and vitals reviewed. Constitutional: She is oriented to person, place, and time. She appears well-developed and well-nourished. No distress.  HENT:  Head: Normocephalic and atraumatic.  Mouth/Throat: Oropharynx is clear and moist. No oropharyngeal exudate.  Raw appearance to palate.   Eyes: Conjunctivae and EOM are normal.  Cardiovascular: Normal rate, regular rhythm and normal heart sounds.   Pulmonary/Chest: Effort normal and breath sounds normal. No stridor. No respiratory distress.  Abdominal: She exhibits no distension.  Musculoskeletal: She exhibits no edema.  Neurological: She is alert and oriented to person, place, and time. No cranial nerve deficit.  Skin: Skin is warm and dry.  Psychiatric: She has a normal mood and affect.    ED Course  Procedures (including critical care time) COORDINATION OF CARE: 10:03 PM Discussed treatment plan which includes CXR, Levaquin, solu-medrol injection, Proventil nebulizer solution, CBC with differential, comprehensive metabolic panel and EKG with pt at bedside and pt agreed to plan.   Labs Review Labs Reviewed  CBC WITH DIFFERENTIAL - Abnormal; Notable for the following:    Platelets 119 (*)    Monocytes Relative 15 (*)    All other components within normal limits  COMPREHENSIVE METABOLIC PANEL - Abnormal; Notable for the following:    Glucose, Bld 136 (*)    Albumin 3.3 (*)    AST 43 (*)    Alkaline Phosphatase 139 (*)    GFR calc non Af Amer 73 (*)    GFR calc Af Amer 85 (*)    All other components within normal limits  Imaging Review Dg Chest 2 View  09/18/2013   CLINICAL DATA:  Shortness of breath and dry cough for 3 days.  EXAM: CHEST  2 VIEW  COMPARISON:  Chest radiograph performed 04/12/2012   FINDINGS: The lungs are well-aerated. Minimal bibasilar opacities may reflect atelectasis or possibly mild pneumonia. There is no evidence of pleural effusion or pneumothorax.  The heart is normal in size; the mediastinal contour is within normal limits. No acute osseous abnormalities are seen.  IMPRESSION: Minimal bibasilar airspace opacities may reflect atelectasis or possibly mild pneumonia.   Electronically Signed   By: Garald Balding M.D.   On: 09/18/2013 21:16     Date: 09/18/2013  Rate: 88  Rhythm: normal sinus rhythm  QRS Axis: normal  Intervals: normal  ST/T Wave abnormalities: nonspecific T wave changes  Conduction Disutrbances:none  Narrative Interpretation:   Old EKG Reviewed: none available  ABNORMAL    11:33 PM On exam the patient appears calm.  We discussed all findings with sore.  Patient has a pulmonologist with whom she will followup. MDM    I personally performed the services described in this documentation, which was scribed in my presence. The recorded information has been reviewed and is accurate.  Patient presents with ongoing congestion, cough.  Patient is in no distress on exam, the tachypnea, no excessive muscle use, no distress.  Patient's x-ray suggests pneumonia, the patient is afebrile, with audible breath sounds throughout. Patient improved here, and with reassuring vitals, labs, was discharged in stable condition to follow up with her pulmonologist after initiation of steroids, antibiotics.     Carmin Muskrat, MD 09/18/13 [phone]

## 2013-09-18 NOTE — ED Notes (Signed)
SOB x3 days.  Went to UC yesterday but is worse today.  No xr was done.

## 2013-09-18 NOTE — Discharge Instructions (Signed)
As discussed, it is important that you follow up as soon as possible with your physician for continued management of your condition.  If you develop any new, or concerning changes in your condition, please return to the emergency department immediately.    COPD ACTION PLAN Actions to Take if My Symptoms Get Worse 24-Hour Nurse Call Line: 1 [phone]  Green Zone: I am doing well today  Symptoms: Actions:  Usual activity and exercise level Usual amounts of cough and phlegm/mucus Sleep well at night Appetite is good Continue to take daily "maintenance" medicines (the ones you take "no matter what" until your doctor adjusts them for you) Use oxygen as prescribed Continue regular exercise/diet plan At all times avoid cigarette smoke, inhaled irritants  Yellow Zone: I am having a bad day or a COPD flare  Symptoms Actions  More breathless than usual I have less energy for my daily activities Increased or thicker phlegm/mucus Change in color of phlegm/mucus Using quick relief inhaler/nebulizer more often More coughing than usual I feel like I have a "chest cold" Poor sleep and my symptoms woke me up My appetite is not good My medicine is not helping Continue daily medications Use quick relief inhaler or nebulizer every 4 hours Use oxygen as prescribed Get plenty of rest Use pursed lip breathing At all times avoid cigarette smoke, inhaled irritants Call provider for a same day or next day appointment   if symptoms are not improving within 48 hours of onset or   if symptoms are not improving after quick relief inhaler or nebulizer  Red Zone: I need urgent medical care  Symptoms Actions  Severe shortness of breath even at rest Severe shortness of breath even after quick relief inhaler or nebulizer Not able to do any activity because of breathing Fever or shaking chills Feeling confused or very drowsy Chest pains Coughing up blood Quick relief inhaler or nebulizer not  effective Call 911 or have someone take you to the nearest emergency room Call provider for  now or same day appointment  3 - 2 - 1 Plan: If any of the 3 main COPD symptoms (Cough, Shortness of Breath, or Mucus Production) change for 2 days or more, your number 1 priority is to call your doctor.    

## 2013-09-22 ENCOUNTER — Ambulatory Visit (HOSPITAL_BASED_OUTPATIENT_CLINIC_OR_DEPARTMENT_OTHER): Payer: Medicare HMO | Admitting: Hematology & Oncology

## 2013-09-22 ENCOUNTER — Encounter: Payer: Self-pay | Admitting: Hematology & Oncology

## 2013-09-22 ENCOUNTER — Other Ambulatory Visit (HOSPITAL_BASED_OUTPATIENT_CLINIC_OR_DEPARTMENT_OTHER): Payer: Medicare HMO | Admitting: Lab

## 2013-09-22 VITALS — BP 131/48 | HR 90 | Temp 98.1°F | Resp 16 | Ht 66.0 in | Wt 172.0 lb

## 2013-09-22 DIAGNOSIS — J438 Other emphysema: Secondary | ICD-10-CM

## 2013-09-22 DIAGNOSIS — D696 Thrombocytopenia, unspecified: Secondary | ICD-10-CM

## 2013-09-22 DIAGNOSIS — C50419 Malignant neoplasm of upper-outer quadrant of unspecified female breast: Secondary | ICD-10-CM

## 2013-09-22 DIAGNOSIS — C50919 Malignant neoplasm of unspecified site of unspecified female breast: Secondary | ICD-10-CM

## 2013-09-22 DIAGNOSIS — Z170 Estrogen receptor positive status [ER+]: Secondary | ICD-10-CM

## 2013-09-22 LAB — CBC WITH DIFFERENTIAL (CANCER CENTER ONLY)
BASO#: 0 10*3/uL (ref 0.0–0.2)
BASO%: 0.6 % (ref 0.0–2.0)
EOS%: 2.4 % (ref 0.0–7.0)
Eosinophils Absolute: 0.1 10*3/uL (ref 0.0–0.5)
HEMATOCRIT: 40.1 % (ref 34.8–46.6)
HEMOGLOBIN: 13.4 g/dL (ref 11.6–15.9)
LYMPH#: 1.5 10*3/uL (ref 0.9–3.3)
LYMPH%: 27.1 % (ref 14.0–48.0)
MCH: 31.5 pg (ref 26.0–34.0)
MCHC: 33.4 g/dL (ref 32.0–36.0)
MCV: 94 fL (ref 81–101)
MONO#: 0.6 10*3/uL (ref 0.1–0.9)
MONO%: 11.7 % (ref 0.0–13.0)
NEUT#: 3.1 10*3/uL (ref 1.5–6.5)
NEUT%: 58.2 % (ref 39.6–80.0)
Platelets: 117 10*3/uL — ABNORMAL LOW (ref 145–400)
RBC: 4.26 10*6/uL (ref 3.70–5.32)
RDW: 13.3 % (ref 11.1–15.7)
WBC: 5.4 10*3/uL (ref 3.9–10.0)

## 2013-09-22 LAB — CHCC SATELLITE - SMEAR

## 2013-09-22 NOTE — Progress Notes (Signed)
  DIAGNOSIS:  Stage IIa (T2 N0M0) infiltrating adenocarcinoma of the right breast  Remote history of stage I adenocarcinoma of the right breast-2000  Thrombocytopenia-chronic   CURRENT THERAPY:  Patient to complete 5 years of Femara today   INTERIM HISTORY:  Ashley Savage comes in for followup. We last saw her back in October. Unfortunately, she has an exacerbation of her COPD. She is pretty bad emphysema. She's on penicillin right now. She is on antibiotics. She has nebulizers at home. She underwent a chest x-ray. This was done in the emergency room. There was no evidence of fluid or obvious pneumonia.    She unfortunately has had issues with her family. She moved to the area to be with her family. Again, there have been issues with respect to several family members that have been incredibly irritating.    Otherwise, she seems to be doing okay. She is a little "jittery" from the prednisone.   She's had no bleeding. She's had no nausea vomiting.  Her last mammogram was in December of 2014. This was unremarkable.  PHYSICAL EXAMINATION: Slightly ill-appearing white female in no obvious distress. She is coughing quite a bit. Her vital signs show temperature of 98 pulse is 90 expiratory rate is 16 and blood pressure 131/48. Weight is 172. Head and neck exam shows no ocular or oral lesions. She has no adenopathy in the neck. Lungs show good breath sounds bilaterally. Some wheezes are noted. Cardiac exam regular rate and rhythm with no murmurs rubs or bruits. Abdomen is soft. Has good bowel sounds. There is no fluid wave. There is no palpable liver or spleen. Breast exam shows left breast with no masses edema or erythema. There is no left axillary adenopathy. Right chest wall shows well healed mastectomy. No nodules or erythema is noted on the right chest wall. There is no right axillary adenopathy. Extremities shows no clubbing cyanosis or edema. No lymphedema is noted in the right arm. Skin  exam is unremarkable.   LABORATORY STUDIES:  White cell count 5.4. Hemoglobin 13.4. Platelet count 117.   IMPRESSION: She is a 73 year old white female with history of stage II a ductal carcinoma of the right breast. Her tumor is ER positive. She has completed Femara for 5 years.  We will continue to follow her along every 6 months. I do not see need for any additional labs or x-rays.  I think that her lung disease will be the main determinate of her prognosis.  Ashley Napoleon, MD 09/22/2013

## 2013-09-26 ENCOUNTER — Encounter: Payer: Self-pay | Admitting: Family Medicine

## 2013-09-26 ENCOUNTER — Telehealth: Payer: Self-pay | Admitting: Family Medicine

## 2013-09-26 ENCOUNTER — Ambulatory Visit (INDEPENDENT_AMBULATORY_CARE_PROVIDER_SITE_OTHER): Payer: Medicare HMO | Admitting: Family Medicine

## 2013-09-26 DIAGNOSIS — M1710 Unilateral primary osteoarthritis, unspecified knee: Secondary | ICD-10-CM

## 2013-09-26 DIAGNOSIS — J4489 Other specified chronic obstructive pulmonary disease: Secondary | ICD-10-CM

## 2013-09-26 DIAGNOSIS — IMO0002 Reserved for concepts with insufficient information to code with codable children: Secondary | ICD-10-CM

## 2013-09-26 DIAGNOSIS — M542 Cervicalgia: Secondary | ICD-10-CM

## 2013-09-26 DIAGNOSIS — K219 Gastro-esophageal reflux disease without esophagitis: Secondary | ICD-10-CM

## 2013-09-26 DIAGNOSIS — H8110 Benign paroxysmal vertigo, unspecified ear: Secondary | ICD-10-CM

## 2013-09-26 DIAGNOSIS — J441 Chronic obstructive pulmonary disease with (acute) exacerbation: Secondary | ICD-10-CM

## 2013-09-26 DIAGNOSIS — I714 Abdominal aortic aneurysm, without rupture, unspecified: Secondary | ICD-10-CM

## 2013-09-26 DIAGNOSIS — E119 Type 2 diabetes mellitus without complications: Secondary | ICD-10-CM

## 2013-09-26 DIAGNOSIS — C50919 Malignant neoplasm of unspecified site of unspecified female breast: Secondary | ICD-10-CM

## 2013-09-26 DIAGNOSIS — E785 Hyperlipidemia, unspecified: Secondary | ICD-10-CM

## 2013-09-26 DIAGNOSIS — M170 Bilateral primary osteoarthritis of knee: Secondary | ICD-10-CM

## 2013-09-26 DIAGNOSIS — J449 Chronic obstructive pulmonary disease, unspecified: Secondary | ICD-10-CM

## 2013-09-26 MED ORDER — ALBUTEROL SULFATE HFA 108 (90 BASE) MCG/ACT IN AERS: 2.0000 | INHALATION_SPRAY | Freq: Four times a day (QID) | RESPIRATORY_TRACT | Status: DC | PRN

## 2013-09-26 MED ORDER — METHYLPREDNISOLONE (PAK) 4 MG PO TABS: ORAL_TABLET | ORAL | Status: DC

## 2013-09-26 NOTE — Telephone Encounter (Signed)
Relevant patient education mailed to patient.  

## 2013-09-26 NOTE — Progress Notes (Signed)
Pre visit review using our clinic review tool, if applicable. No additional management support is needed unless otherwise documented below in the visit note. 

## 2013-09-26 NOTE — Patient Instructions (Addendum)
Add a probiotic such as Digestive Advantage, Align or a generic daily  Chronic Obstructive Pulmonary Disease Chronic obstructive pulmonary disease (COPD) is a common lung condition in which airflow from the lungs is limited. COPD is a general term that can be used to describe many different lung problems that limit airflow, including both chronic bronchitis and emphysema. If you have COPD, your lung function will probably never return to normal, but there are measures you can take to improve lung function and make yourself feel better.  CAUSES   Smoking (common).   Exposure to secondhand smoke.   Genetic problems.  Chronic inflammatory lung diseases or recurrent infections. SYMPTOMS   Shortness of breath, especially with physical activity.   Deep, persistent (chronic) cough with a large amount of thick mucus.   Wheezing.   Rapid breaths (tachypnea).   Gray or bluish discoloration (cyanosis) of the skin, especially in fingers, toes, or lips.   Fatigue.   Weight loss.   Frequent infections or episodes when breathing symptoms become much worse (exacerbations).   Chest tightness. DIAGNOSIS  Your healthcare provider will take a medical history and perform a physical examination to make the initial diagnosis. Additional tests for COPD may include:   Lung (pulmonary) function tests.  Chest X-ray.  CT scan.  Blood tests. TREATMENT  Treatment available to help you feel better when you have COPD include:   Inhaler and nebulizer medicines. These help manage the symptoms of COPD and make your breathing more comfortable  Supplemental oxygen. Supplemental oxygen is only helpful if you have a low oxygen level in your blood.   Exercise and physical activity. These are beneficial for nearly all people with COPD. Some people may also benefit from a pulmonary rehabilitation program. HOME CARE INSTRUCTIONS   Take all medicines (inhaled or pills) as directed by your health  care provider.  Only take over-the-counter or prescription medicines for pain, fever, or discomfort as directed by your health care provider.   Avoid over-the-counter medicines or cough syrups that dry up your airway (such as antihistamines) and slow down the elimination of secretions unless instructed otherwise by your healthcare provider.   If you are a smoker, the most important thing that you can do is stop smoking. Continuing to smoke will cause further lung damage and breathing trouble. Ask your health care provider for help with quitting smoking. He or she can direct you to community resources or hospitals that provide support.  Avoid exposure to irritants such as smoke, chemicals, and fumes that aggravate your breathing.  Use oxygen therapy and pulmonary rehabilitation if directed by your health care provider. If you require home oxygen therapy, ask your healthcare provider whether you should purchase a pulse oximeter to measure your oxygen level at home.   Avoid contact with individuals who have a contagious illness.  Avoid extreme temperature and humidity changes.  Eat healthy foods. Eating smaller, more frequent meals and resting before meals may help you maintain your strength.  Stay active, but balance activity with periods of rest. Exercise and physical activity will help you maintain your ability to do things you want to do.  Preventing infection and hospitalization is very important when you have COPD. Make sure to receive all the vaccines your health care provider recommends, especially the pneumococcal and influenza vaccines. Ask your healthcare provider whether you need a pneumonia vaccine.  Learn and use relaxation techniques to manage stress.  Learn and use controlled breathing techniques as directed by your health care  provider. Controlled breathing techniques include:   Pursed lip breathing. Start by breathing in (inhaling) through your nose for 1 second. Then,  purse your lips as if you were going to whistle and breathe out (exhale) through the pursed lips for 2 seconds.   Diaphragmatic breathing. Start by putting one hand on your abdomen just above your waist. Inhale slowly through your nose. The hand on your abdomen should move out. Then purse your lips and exhale slowly. You should be able to feel the hand on your abdomen moving in as you exhale.   Learn and use controlled coughing to clear mucus from your lungs. Controlled coughing is a series of short, progressive coughs. The steps of controlled coughing are:  1. Lean your head slightly forward.  2. Breathe in deeply using diaphragmatic breathing.  3. Try to hold your breath for 3 seconds.  4. Keep your mouth slightly open while coughing twice.  5. Spit any mucus out into a tissue.  6. Rest and repeat the steps once or twice as needed. SEEK MEDICAL CARE IF:   You are coughing up more mucus than usual.   There is a change in the color or thickness of your mucus.   Your breathing is more labored than usual.   Your breathing is faster than usual.  SEEK IMMEDIATE MEDICAL CARE IF:   You have shortness of breath while you are resting.   You have shortness of breath that prevents you from:  Being able to talk.   Performing your usual physical activities.   You have chest pain lasting longer than 5 minutes.   Your skin color is more cyanotic than usual.  You measure low oxygen saturations for longer than 5 minutes with a pulse oximeter. MAKE SURE YOU:   Understand these instructions.  Will watch your condition.  Will get help right away if you are not doing well or get worse. Document Released: 04/22/2005 Document Revised: 05/03/2013 Document Reviewed: 03/09/2013 [facility] Patient Information 2014 [place], [place].

## 2013-09-26 NOTE — Progress Notes (Signed)
[name], female   DOB: 1940-11-15, 73 y.o.   MRN: [id number] TYA HAUGHEY [id number] 12-02-1940 09/26/2013      Progress Note-Follow Up  Subjective  Chief Complaint  Chief Complaint  Patient presents with  . new to est. pt    pt is new. pt was in the ER for COPD on friday a wk ago. pt cough constantly, denied chest pain, nasal congestion, difficulty to fall asleep since the er visit.    HPI  Patient is a 73 year old female who is in today to establish care. She is recently been struggling with bronchitis. She took Levaquin and prednisone for a couple of days but she felt irritable and shaky and struggle with insomnia so she stopped she continues to cough and struggle with fatigue. She has some wheezing and congestion as well. No fevers or chills. No chest pain or palpitations. She struggles with chronic knee pain bilaterally. Struggles with chronic fatigue but denies ear or throat pain. No fevers or chills. Follows monthly with mental health.  Past Medical History  Diagnosis Date  . Emphysema   . Diabetes mellitus type 2  . Hyperlipidemia   . Cancer breast ca  right  . Anxiety   . Panic attacks   . Depression     Past Surgical History  Procedure Laterality Date  . Gallbladder surgery  1992  . Breast surgery  2009 right  . Appendectomy  2007  . Knee surgery    . Mandible fracture surgery    . Pilonidal cyst excision    . Tonsillectomy      Family History  Problem Relation Age of Onset  . Heart failure Father   . COPD Father   . Arthritis Father 55  . Pneumonia Sister   . Breast cancer    . Stroke Mother   . Arthritis Mother 56  . Hyperlipidemia Mother   . Hypertension Mother   . Diabetes Mother   . Breast cancer Maternal Aunt   . Alcohol abuse Maternal Uncle     History   Social History  . Marital Status: Single    Spouse Name: N/A    Number of Children: 0  . Years of Education: N/A   Occupational History  . retire    Social History  Main Topics  . Smoking status: Current Every Day Smoker -- 1.00 packs/day for 48 years    Types: Cigarettes    Start date: 07/27/1964  . Smokeless tobacco: Never Used     Comment: started smoking at age 56.   . Alcohol Use: No  . Drug Use: No  . Sexual Activity: Yes    Birth Control/ Protection: Post-menopausal   Other Topics Concern  . Not on file   Social History Narrative  . No narrative on file    Current Outpatient Prescriptions on File Prior to Visit  Medication Sig Dispense Refill  . albuterol (PROAIR HFA) 108 (90 BASE) MCG/ACT inhaler Inhale 2 puffs into the lungs every 4 (four) hours as needed for wheezing.  1 Inhaler  4  . albuterol (PROVENTIL) (2.5 MG/3ML) 0.083% nebulizer solution Inhale one vial via nebulizer four times daily as needed.  Dx code 496  360 mL  5  . Blood Glucose Monitoring Suppl (ONE TOUCH ULTRA SYSTEM KIT) W/DEVICE KIT 1 kit by Does not apply route once.      . Cholecalciferol (VITAMIN D3) 2000 UNITS TABS Take by mouth 2 (two) times daily.      Marland Kitchen  diazepam (VALIUM) 5 MG tablet Take 5 mg by mouth. 1 daily as needed       . Fluticasone-Salmeterol (ADVAIR DISKUS) 250-50 MCG/DOSE AEPB INHALE ONE PUFF BY MOUTH TWICE DAILY  14 each  0  . lovastatin (MEVACOR) 20 MG tablet 20 mg 1 by mouth daily      . metFORMIN (GLUCOPHAGE) 1000 MG tablet Take by mouth. 250 mg in am and 500 mg in the evening      . Naproxen Sodium (ALEVE) 220 MG CAPS Take by mouth. As needed       . omeprazole (PRILOSEC OTC) 20 MG tablet Take 20 mg by mouth daily.         No current facility-administered medications on file prior to visit.    Allergies  Allergen Reactions  . Citalopram     Confusion, irregular heart beat.  . Erythromycin     Stomach cramps    Review of Systems  Review of Systems  Constitutional: Positive for malaise/fatigue. Negative for fever.  HENT: Positive for congestion.   Eyes: Negative for discharge.  Respiratory: Positive for cough, sputum production and  wheezing. Negative for shortness of breath.   Cardiovascular: Negative for chest pain, palpitations and leg swelling.  Gastrointestinal: Negative for nausea, abdominal pain and diarrhea.  Genitourinary: Negative for dysuria.  Musculoskeletal: Positive for joint pain and neck pain. Negative for falls.  Skin: Negative for rash.  Neurological: Negative for loss of consciousness and headaches.  Endo/Heme/Allergies: Negative for polydipsia.  Psychiatric/Behavioral: Negative for depression and suicidal ideas. The patient is not nervous/anxious and does not have insomnia.     Objective  There were no vitals taken for this visit.  Physical Exam  Physical Exam  Constitutional: She is oriented to person, place, and time and well-developed, well-nourished, and in no distress. No distress.  HENT:  Head: Normocephalic and atraumatic.  Right Ear: External ear normal.  Left Ear: External ear normal.  Nose: Nose normal.  Mouth/Throat: Oropharynx is clear and moist. No oropharyngeal exudate.  Eyes: Conjunctivae are normal. Pupils are equal, round, and reactive to light. Right eye exhibits no discharge. Left eye exhibits no discharge. No scleral icterus.  Neck: Normal range of motion. Neck supple. No thyromegaly present.  Cardiovascular: Normal rate, regular rhythm, normal heart sounds and intact distal pulses.   No murmur heard. Pulmonary/Chest: Effort normal. No respiratory distress. She has wheezes. She has rales.  Abdominal: Soft. Bowel sounds are normal. She exhibits no distension and no mass. There is no tenderness.  Musculoskeletal: Normal range of motion. She exhibits no edema and no tenderness.  Lymphadenopathy:    She has no cervical adenopathy.  Neurological: She is alert and oriented to person, place, and time. She has normal reflexes. No cranial nerve deficit. Coordination normal.  Skin: Skin is warm and dry. No rash noted. She is not diaphoretic.  Psychiatric: Mood, memory and affect  normal.    Lab Results  Component Value Date   TSH 0.676 02/12/2012   Lab Results  Component Value Date   WBC 5.4 09/22/2013   HGB 13.4 09/22/2013   HCT 40.1 09/22/2013   MCV 94 09/22/2013   PLT 117* 09/22/2013   Lab Results  Component Value Date   CREATININE 0.80 09/18/2013   BUN 14 09/18/2013   NA 139 09/18/2013   K 4.2 09/18/2013   CL 100 09/18/2013   CO2 26 09/18/2013   Lab Results  Component Value Date   ALT 27 09/18/2013   AST 43*  09/18/2013   ALKPHOS 139* 09/18/2013   BILITOT 0.9 09/18/2013     Assessment & Plan  Breast cancer Follows with Dr Marin Olp  Diabetes mellitus Continue current meds, avoid simple carbs and request old records  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 Recently treated with Levaquin and Prednisone, struggled with insomnia, irritability and shakiness. Improved only partially since she only took a few days worth of the med. Given an rx of Medrol dosepak   Hyperlipidemia Tolerating Lovastatin and avoid trans fats  Benign paroxysmal positional vertigo Maintain adequate hydration and report worsening symptoms  Esophageal reflux Avoid offending foods and continue Omeprazole

## 2013-09-27 ENCOUNTER — Telehealth: Payer: Self-pay | Admitting: Family Medicine

## 2013-09-27 NOTE — Telephone Encounter (Signed)
Relevant patient education mailed to patient.  

## 2013-09-28 ENCOUNTER — Encounter: Payer: Self-pay | Admitting: Adult Health

## 2013-09-28 ENCOUNTER — Ambulatory Visit (INDEPENDENT_AMBULATORY_CARE_PROVIDER_SITE_OTHER): Payer: Medicare HMO | Admitting: Adult Health

## 2013-09-28 VITALS — BP 130/68 | HR 98 | Temp 98.2°F | Wt 169.0 lb

## 2013-09-28 DIAGNOSIS — J441 Chronic obstructive pulmonary disease with (acute) exacerbation: Secondary | ICD-10-CM

## 2013-09-28 NOTE — Progress Notes (Signed)
   Subjective:    [name], female    DOB: 1940-11-01, 73 y.o.   MRN: [id number]  HPI 73 yo WF with Copd-active smoker Moved from [facility] 2008, saw Lung MD there.  No lung MD has yet seen the pt. Has been on same meds as before.  All family is in [place] Dx Copd many years.(2004) Still smokes 1PPDx71yrs Side effects with smoking cessation tools:  Patches: nightmares,  Nicotrol?helps,  chantix nausea    06/12/2013 Chief Complaint  Patient presents with  . 4 month follow up    Breathing is unchanged.  Has SOB when first getting up in the morning along with nasal and chest congestion with clear to white mucus, runny nose, and some whezing.    Pt still smoking >1PPD.  Pt still with dyspnea.  Pt remains depressed.   >>no changes   09/28/2013 ER follow up  Pt returns for follow up for COPD . Seen in ER 1 week ago, tx/ w Levaquin and prednisone .  Returned to PCP 3/3 w/ continued cough and wheezing . Started on medrol dose pack. Still not back to baseline but some better. Feels she can breath deeper .   Wants new portable neb machine, old machine >10 yr  No fever, discolored mucus, edema or orthopnea.  Still smoking , cessation discussed.    Review of Systems Constitutional:   No  weight loss, night sweats,  Fevers, chills,  ++fatigue, or  lassitude.  HEENT:   No headaches,  Difficulty swallowing,  Tooth/dental problems, or  Sore throat,                No sneezing, itching, ear ache, ++ nasal congestion, post nasal drip,   CV:  No chest pain,  Orthopnea, PND, swelling in lower extremities, anasarca, dizziness, palpitations, syncope.   GI  No heartburn, indigestion, abdominal pain, nausea, vomiting, diarrhea, change in bowel habits, loss of appetite, bloody stools.   Resp:   No chest wall deformity  Skin: no rash or lesions.  GU: no dysuria, change in color of urine, no urgency or frequency.  No flank pain, no hematuria   MS:  No joint pain or swelling.  No  decreased range of motion.  No back pain.  Psych:  No change in mood or affect. No depression or anxiety.  No memory loss.         Objective:   Physical Exam GEN: A/Ox3; pleasant , NAD, elderly    HEENT:  [place]/AT,  EACs-clear, TMs-wnl, NOSE-clear, THROAT-clear, no lesions, no postnasal drip or exudate noted.   NECK:  Supple w/ fair ROM; no JVD; normal carotid impulses w/o bruits; no thyromegaly or nodules palpated; no lymphadenopathy.  RESP  Diminished BS in bases , No accessory muscle use, no dullness to percussion  CARD:  RRR, no m/r/g  , no peripheral edema, pulses intact, no cyanosis or clubbing.  GI:   Soft & nt; nml bowel sounds; no organomegaly or masses detected.  Musco: Warm bil, no deformities or joint swelling noted.   Neuro: alert, no focal deficits noted.    Skin: Warm, no lesions or rashes         Assessment & Plan:

## 2013-09-28 NOTE — Patient Instructions (Addendum)
Taper Prednisone as directed.  Mucinex DM Twice daily  As needed  Cough/congestion  MUST QUIT SMOKING .  Continue on Probiotic .  Saline nasal rinses As needed   follow up Dr. Joya Gaskins  In 2 months in [facility]  Please contact office for sooner follow up if symptoms do not improve or worsen or seek emergency care

## 2013-10-01 ENCOUNTER — Encounter: Payer: Self-pay | Admitting: Family Medicine

## 2013-10-01 DIAGNOSIS — H8110 Benign paroxysmal vertigo, unspecified ear: Secondary | ICD-10-CM

## 2013-10-01 DIAGNOSIS — M1711 Unilateral primary osteoarthritis, right knee: Secondary | ICD-10-CM

## 2013-10-01 DIAGNOSIS — K219 Gastro-esophageal reflux disease without esophagitis: Secondary | ICD-10-CM

## 2013-10-01 DIAGNOSIS — M170 Bilateral primary osteoarthritis of knee: Secondary | ICD-10-CM

## 2013-10-01 DIAGNOSIS — I714 Abdominal aortic aneurysm, without rupture, unspecified: Secondary | ICD-10-CM

## 2013-10-01 DIAGNOSIS — J449 Chronic obstructive pulmonary disease, unspecified: Secondary | ICD-10-CM

## 2013-10-01 DIAGNOSIS — M542 Cervicalgia: Secondary | ICD-10-CM

## 2013-10-01 HISTORY — DX: Abdominal aortic aneurysm, without rupture, unspecified: I71.40

## 2013-10-01 HISTORY — DX: Benign paroxysmal vertigo, unspecified ear: H81.10

## 2013-10-01 HISTORY — DX: Bilateral primary osteoarthritis of knee: M17.0

## 2013-10-01 HISTORY — DX: Cervicalgia: M54.2

## 2013-10-01 HISTORY — DX: Gastro-esophageal reflux disease without esophagitis: K21.9

## 2013-10-01 HISTORY — DX: Chronic obstructive pulmonary disease, unspecified: J44.9

## 2013-10-01 HISTORY — DX: Unilateral primary osteoarthritis, right knee: M17.11

## 2013-10-01 HISTORY — DX: Abdominal aortic aneurysm, without rupture: I71.4

## 2013-10-01 NOTE — Assessment & Plan Note (Signed)
Follows with Dr Ennever 

## 2013-10-01 NOTE — Assessment & Plan Note (Signed)
Continue current meds, avoid simple carbs and request old records

## 2013-10-01 NOTE — Assessment & Plan Note (Signed)
Recently treated with Levaquin and Prednisone, struggled with insomnia, irritability and shakiness. Improved only partially since she only took a few days worth of the med. Given an rx of Medrol dosepak

## 2013-10-01 NOTE — Assessment & Plan Note (Signed)
Avoid offending foods and continue Omeprazole

## 2013-10-01 NOTE — Assessment & Plan Note (Signed)
Maintain adequate hydration and report worsening symptoms

## 2013-10-01 NOTE — Assessment & Plan Note (Signed)
Tolerating Lovastatin and avoid trans fats

## 2013-10-02 NOTE — Assessment & Plan Note (Signed)
Slow to resolve flare in pt that continue to smoke  Plan  aper Prednisone as directed.  Mucinex DM Twice daily  As needed  Cough/congestion  MUST QUIT SMOKING .  Continue on Probiotic .  Saline nasal rinses As needed   follow up Dr. Joya Gaskins  In 2 months in [facility]  Please contact office for sooner follow up if symptoms do not improve or worsen or seek emergency care

## 2013-10-23 ENCOUNTER — Other Ambulatory Visit: Payer: Self-pay

## 2013-10-23 ENCOUNTER — Telehealth: Payer: Self-pay | Admitting: Family Medicine

## 2013-10-23 MED ORDER — LOVASTATIN 20 MG PO TABS: 20.0000 mg | ORAL_TABLET | Freq: Every day | ORAL | Status: DC

## 2013-10-23 NOTE — Telephone Encounter (Signed)
Refill- mevacor

## 2013-10-23 NOTE — Telephone Encounter (Signed)
Ashley Savage from [facility] pharmacy called requesting a 90 day supply of lovastatin 20 mg.  New rx sent

## 2013-11-27 ENCOUNTER — Telehealth: Payer: Self-pay | Admitting: Family Medicine

## 2013-11-27 MED ORDER — DIAZEPAM 5 MG PO TABS: 5.0000 mg | ORAL_TABLET | Freq: Every day | ORAL | Status: DC | PRN

## 2013-11-27 NOTE — Telephone Encounter (Signed)
RX faxed

## 2013-11-27 NOTE — Telephone Encounter (Signed)
Please advise Diazepam refill? I don't see where md has filled this?

## 2013-11-27 NOTE — Telephone Encounter (Signed)
OK to fill the Diazepam 5 mg tab 1 tab po daily prn anxiety/insomnia, disp #30 with 3 rf

## 2013-11-27 NOTE — Telephone Encounter (Signed)
Refill diazepam

## 2013-11-28 ENCOUNTER — Other Ambulatory Visit: Payer: Self-pay

## 2013-11-28 MED ORDER — DIAZEPAM 5 MG PO TABS: 5.0000 mg | ORAL_TABLET | Freq: Every day | ORAL | Status: DC | PRN

## 2013-11-28 NOTE — Telephone Encounter (Signed)
Valium rx reprinted. Pharmacy wont accept dr name stamped

## 2013-12-08 ENCOUNTER — Ambulatory Visit (INDEPENDENT_AMBULATORY_CARE_PROVIDER_SITE_OTHER): Payer: Medicare HMO | Admitting: Family Medicine

## 2013-12-08 ENCOUNTER — Encounter: Payer: Self-pay | Admitting: Family Medicine

## 2013-12-08 VITALS — BP 110/68 | HR 88 | Temp 97.8°F | Ht 67.0 in | Wt 165.1 lb

## 2013-12-08 DIAGNOSIS — E785 Hyperlipidemia, unspecified: Secondary | ICD-10-CM

## 2013-12-08 DIAGNOSIS — F411 Generalized anxiety disorder: Secondary | ICD-10-CM

## 2013-12-08 DIAGNOSIS — R600 Localized edema: Secondary | ICD-10-CM

## 2013-12-08 DIAGNOSIS — N3281 Overactive bladder: Secondary | ICD-10-CM

## 2013-12-08 DIAGNOSIS — R609 Edema, unspecified: Secondary | ICD-10-CM

## 2013-12-08 DIAGNOSIS — E119 Type 2 diabetes mellitus without complications: Secondary | ICD-10-CM

## 2013-12-08 DIAGNOSIS — K219 Gastro-esophageal reflux disease without esophagitis: Secondary | ICD-10-CM

## 2013-12-08 DIAGNOSIS — J449 Chronic obstructive pulmonary disease, unspecified: Secondary | ICD-10-CM

## 2013-12-08 DIAGNOSIS — N318 Other neuromuscular dysfunction of bladder: Secondary | ICD-10-CM

## 2013-12-08 LAB — RENAL FUNCTION PANEL
Albumin: 3.5 g/dL (ref 3.5–5.2)
BUN: 10 mg/dL (ref 6–23)
CHLORIDE: 104 meq/L (ref 96–112)
CO2: 30 meq/L (ref 19–32)
Calcium: 9 mg/dL (ref 8.4–10.5)
Creat: 0.62 mg/dL (ref 0.50–1.10)
Glucose, Bld: 114 mg/dL — ABNORMAL HIGH (ref 70–99)
PHOSPHORUS: 4.1 mg/dL (ref 2.3–4.6)
Potassium: 4.2 mEq/L (ref 3.5–5.3)
Sodium: 142 mEq/L (ref 135–145)

## 2013-12-08 LAB — HEPATIC FUNCTION PANEL
ALK PHOS: 88 U/L (ref 39–117)
ALT: 22 U/L (ref 0–35)
AST: 32 U/L (ref 0–37)
Albumin: 3.5 g/dL (ref 3.5–5.2)
BILIRUBIN DIRECT: 0.4 mg/dL — AB (ref 0.0–0.3)
Indirect Bilirubin: 1.4 mg/dL — ABNORMAL HIGH (ref 0.2–1.2)
Total Bilirubin: 1.8 mg/dL — ABNORMAL HIGH (ref 0.2–1.2)
Total Protein: 5.8 g/dL — ABNORMAL LOW (ref 6.0–8.3)

## 2013-12-08 LAB — CBC
HEMATOCRIT: 40.2 % (ref 36.0–46.0)
Hemoglobin: 14.1 g/dL (ref 12.0–15.0)
MCH: 31.6 pg (ref 26.0–34.0)
MCHC: 35.1 g/dL (ref 30.0–36.0)
MCV: 90.1 fL (ref 78.0–100.0)
Platelets: 125 10*3/uL — ABNORMAL LOW (ref 150–400)
RBC: 4.46 MIL/uL (ref 3.87–5.11)
RDW: 14.3 % (ref 11.5–15.5)
WBC: 4.2 10*3/uL (ref 4.0–10.5)

## 2013-12-08 LAB — HEMOGLOBIN A1C
Hgb A1c MFr Bld: 7.2 % — ABNORMAL HIGH (ref <5.7)
Mean Plasma Glucose: 160 mg/dL — ABNORMAL HIGH (ref <117)

## 2013-12-08 LAB — LIPID PANEL
CHOL/HDL RATIO: 2.7 ratio
Cholesterol: 144 mg/dL (ref 0–200)
HDL: 53 mg/dL (ref >39)
LDL CALC: 75 mg/dL (ref 0–99)
TRIGLYCERIDES: 81 mg/dL (ref <150)
VLDL: 16 mg/dL (ref 0–40)

## 2013-12-08 LAB — TSH: TSH: 1.158 u[IU]/mL (ref 0.350–4.500)

## 2013-12-08 MED ORDER — GUAIFENESIN ER 600 MG PO TB12: 600.0000 mg | ORAL_TABLET | Freq: Every day | ORAL | Status: DC

## 2013-12-08 MED ORDER — DIAZEPAM 5 MG PO TABS: 5.0000 mg | ORAL_TABLET | Freq: Every day | ORAL | Status: DC | PRN

## 2013-12-08 NOTE — Patient Instructions (Signed)
Compression hose such as Jobst light weight, knee his, on in am off in pm. 10-20 mmhg Get feet above heart for 15 minutes twice a day  Edema Edema is an abnormal build-up of fluids in tissues. Because this is partly dependent on gravity (water flows to the lowest place), it is more common in the legs and thighs (lower extremities). It is also common in the looser tissues, like around the eyes. Painless swelling of the feet and ankles is common and increases as a person ages. It may affect both legs and may include the calves or even thighs. When squeezed, the fluid may move out of the affected area and may leave a dent for a few moments. CAUSES   Prolonged standing or sitting in one place for extended periods of time. Movement helps pump tissue fluid into the veins, and absence of movement prevents this, resulting in edema.  Varicose veins. The valves in the veins do not work as well as they should. This causes fluid to leak into the tissues.  Fluid and salt overload.  Injury, burn, or surgery to the leg, ankle, or foot, may damage veins and allow fluid to leak out.  Sunburn damages vessels. Leaky vessels allow fluid to go out into the sunburned tissues.  Allergies (from insect bites or stings, medications or chemicals) cause swelling by allowing vessels to become leaky.  Protein in the blood helps keep fluid in your vessels. Low protein, as in malnutrition, allows fluid to leak out.  Hormonal changes, including pregnancy and menstruation, cause fluid retention. This fluid may leak out of vessels and cause edema.  Medications that cause fluid retention. Examples are sex hormones, blood pressure medications, steroid treatment, or anti-depressants.  Some illnesses cause edema, especially heart failure, kidney disease, or liver disease.  Surgery that cuts veins or lymph nodes, such as surgery done for the heart or for breast cancer, may result in edema. DIAGNOSIS  Your caregiver is usually  easily able to determine what is causing your swelling (edema) by simply asking what is wrong (getting a history) and examining you (doing a physical). Sometimes x-rays, EKG (electrocardiogram or heart tracing), and blood work may be done to evaluate for underlying medical illness. TREATMENT  General treatment includes:  Leg elevation (or elevation of the affected body part).  Restriction of fluid intake.  Prevention of fluid overload.  Compression of the affected body part. Compression with elastic bandages or support stockings squeezes the tissues, preventing fluid from entering and forcing it back into the blood vessels.  Diuretics (also called water pills or fluid pills) pull fluid out of your body in the form of increased urination. These are effective in reducing the swelling, but can have side effects and must be used only under your caregiver's supervision. Diuretics are appropriate only for some types of edema. The specific treatment can be directed at any underlying causes discovered. Heart, liver, or kidney disease should be treated appropriately. HOME CARE INSTRUCTIONS   Elevate the legs (or affected body part) above the level of the heart, while lying down.  Avoid sitting or standing still for prolonged periods of time.  Avoid putting anything directly under the knees when lying down, and do not wear constricting clothing or garters on the upper legs.  Exercising the legs causes the fluid to work back into the veins and lymphatic channels. This may help the swelling go down.  The pressure applied by elastic bandages or support stockings can help reduce ankle swelling.  A low-salt diet may help reduce fluid retention and decrease the ankle swelling.  Take any medications exactly as prescribed. SEEK MEDICAL CARE IF:  Your edema is not responding to recommended treatments. SEEK IMMEDIATE MEDICAL CARE IF:   You develop shortness of breath or chest pain.  You cannot breathe  when you lay down; or if, while lying down, you have to get up and go to the window to get your breath.  You are having increasing swelling without relief from treatment.  You develop a fever over 102 F (38.9 C).  You develop pain or redness in the areas that are swollen.  Tell your caregiver right away if you have gained 03 lb/1.4 kg in 1 day or 05 lb/2.3 kg in a week. MAKE SURE YOU:   Understand these instructions.  Will watch your condition.  Will get help right away if you are not doing well or get worse. Document Released: 07/13/2005 Document Revised: 01/12/2012 Document Reviewed: 02/29/2008 [facility] Patient Information 2014 [place].

## 2013-12-08 NOTE — Progress Notes (Signed)
Pre visit review using our clinic review tool, if applicable. No additional management support is needed unless otherwise documented below in the visit note. 

## 2013-12-09 LAB — MICROALBUMIN / CREATININE URINE RATIO
Creatinine, Urine: 198 mg/dL
Microalb Creat Ratio: 4.2 mg/g (ref 0.0–30.0)
Microalb, Ur: 0.83 mg/dL (ref 0.00–1.89)

## 2013-12-10 ENCOUNTER — Encounter: Payer: Self-pay | Admitting: Family Medicine

## 2013-12-10 DIAGNOSIS — N3281 Overactive bladder: Secondary | ICD-10-CM

## 2013-12-10 DIAGNOSIS — R600 Localized edema: Secondary | ICD-10-CM

## 2013-12-10 HISTORY — DX: Localized edema: R60.0

## 2013-12-10 HISTORY — DX: Overactive bladder: N32.81

## 2013-12-10 NOTE — Assessment & Plan Note (Signed)
Slight, encouraged minimize sodium elevate feet, try compression hose

## 2013-12-10 NOTE — Progress Notes (Signed)
[name], female   DOB: 01/24/1941, 73 y.o.   MRN: [id number] Ashley Savage [id number] 09/21/1940 12/10/2013      Progress Note-Follow Up  Subjective  Chief Complaint  Chief Complaint  Patient presents with  . Follow-up    HPI  Patient is a 73 year old female in today for routine medical care. In today for followup. Is noting some episodes of feeling hot and cold. She feels mostly cold in her extremities and hot inner core. She acknowledges that Valium helps care for both of them. She complains of some slight edema in her feet as well most notably in her toes and ankles bilaterally worse in the evening. She notes that spray the is not lasting as long as directed and she is considering switching back. She has a long history of overactive bladder incontinence and is flared recently. She has a long history of peripheral neuropathy but this is not worsening.  Past Medical History  Diagnosis Date  . Emphysema   . Diabetes mellitus type 2  . Hyperlipidemia   . Cancer breast ca  right  . Anxiety   . Panic attacks   . Depression   . Arthritis of both knees 10/01/2013  . Benign paroxysmal positional vertigo 10/01/2013  . Neck pain 10/01/2013  . Esophageal reflux 10/01/2013  . Pedal edema 12/10/2013  . Overactive bladder 12/10/2013    Past Surgical History  Procedure Laterality Date  . Gallbladder surgery  1992  . Breast surgery  2009 right  . Appendectomy  2007  . Knee surgery    . Mandible fracture surgery    . Pilonidal cyst excision    . Tonsillectomy      Family History  Problem Relation Age of Onset  . Heart failure Father   . COPD Father   . Arthritis Father 75  . Pneumonia Sister   . Breast cancer    . Stroke Mother   . Arthritis Mother 16  . Hyperlipidemia Mother   . Hypertension Mother   . Diabetes Mother   . Breast cancer Maternal Aunt   . Alcohol abuse Maternal Uncle     History   Social History  . Marital Status: Single    Spouse Name: N/A   Number of Children: 0  . Years of Education: N/A   Occupational History  . retire    Social History Main Topics  . Smoking status: Current Every Day Smoker -- 0.05 packs/day for 48 years    Types: Cigarettes    Start date: 07/27/1964  . Smokeless tobacco: Never Used     Comment: uses e cigs as well 09/28/13  . Alcohol Use: No  . Drug Use: No  . Sexual Activity: Yes    Birth Control/ Protection: Post-menopausal   Other Topics Concern  . Not on file   Social History Narrative  . No narrative on file    Current Outpatient Prescriptions on File Prior to Visit  Medication Sig Dispense Refill  . albuterol (PROVENTIL HFA;VENTOLIN HFA) 108 (90 BASE) MCG/ACT inhaler Inhale 2 puffs into the lungs every 6 (six) hours as needed for wheezing or shortness of breath. Only dispense Ventolin  1 Inhaler  3  . Blood Glucose Monitoring Suppl (ONE TOUCH ULTRA SYSTEM KIT) W/DEVICE KIT 1 kit by Does not apply route once.      . Cholecalciferol (VITAMIN D3) 2000 UNITS TABS Take by mouth 2 (two) times daily.      . Fluticasone-Salmeterol (ADVAIR  DISKUS) 250-50 MCG/DOSE AEPB INHALE ONE PUFF BY MOUTH TWICE DAILY  14 each  0  . lovastatin (MEVACOR) 20 MG tablet Take 1 tablet (20 mg total) by mouth at bedtime. 20 mg 1 by mouth daily  90 tablet  1  . metFORMIN (GLUCOPHAGE) 1000 MG tablet Take by mouth. 250 mg in am and 500 mg in the evening      . Naproxen Sodium (ALEVE) 220 MG CAPS Take by mouth. As needed       . omeprazole (PRILOSEC OTC) 20 MG tablet Take 20 mg by mouth daily.         No current facility-administered medications on file prior to visit.    Allergies  Allergen Reactions  . Citalopram     Confusion, irregular heart beat.  . Erythromycin     Stomach cramps    Review of Systems  Review of Systems  Constitutional: Negative for fever and malaise/fatigue.  HENT: Negative for congestion.   Eyes: Negative for discharge.  Respiratory: Negative for shortness of breath.   Cardiovascular:  Positive for leg swelling. Negative for chest pain and palpitations.  Gastrointestinal: Negative for nausea, abdominal pain and diarrhea.  Genitourinary: Negative for dysuria.  Musculoskeletal: Negative for falls.  Skin: Negative for rash.  Neurological: Negative for loss of consciousness and headaches.  Endo/Heme/Allergies: Negative for polydipsia.  Psychiatric/Behavioral: Negative for depression and suicidal ideas. The patient is not nervous/anxious and does not have insomnia.     Objective  BP 110/68  Pulse 88  Temp(Src) 97.8 F (36.6 C) (Oral)  Ht '5\' 7"'  (1.702 m)  Wt 165 lb 1.3 oz (74.88 kg)  BMI 25.85 kg/m2  SpO2 93%  Physical Exam  Physical Exam  Constitutional: She is oriented to person, place, and time. She appears well-developed and well-nourished.  HENT:  Head: Normocephalic and atraumatic.  Eyes: EOM are normal. Pupils are equal, round, and reactive to light.  Neck: Normal range of motion. Neck supple.  Cardiovascular: Normal rate and regular rhythm.   Pulmonary/Chest: Effort normal. She has no wheezes.  Abdominal: Soft. Bowel sounds are normal. She exhibits no distension.  Musculoskeletal: Normal range of motion. She exhibits edema.  Neurological: She is alert and oriented to person, place, and time.  Skin: Skin is warm and dry.  Psychiatric: She has a normal mood and affect.    Lab Results  Component Value Date   TSH 1.158 12/08/2013   Lab Results  Component Value Date   WBC 4.2 12/08/2013   HGB 14.1 12/08/2013   HCT 40.2 12/08/2013   MCV 90.1 12/08/2013   PLT 125* 12/08/2013   Lab Results  Component Value Date   CREATININE 0.62 12/08/2013   BUN 10 12/08/2013   NA 142 12/08/2013   K 4.2 12/08/2013   CL 104 12/08/2013   CO2 30 12/08/2013   Lab Results  Component Value Date   ALT 22 12/08/2013   AST 32 12/08/2013   ALKPHOS 88 12/08/2013   BILITOT 1.8* 12/08/2013   Lab Results  Component Value Date   CHOL 144 12/08/2013   Lab Results  Component Value  Date   HDL 53 12/08/2013   Lab Results  Component Value Date   LDLCALC 75 12/08/2013   Lab Results  Component Value Date   TRIG 81 12/08/2013   Lab Results  Component Value Date   CHOLHDL 2.7 12/08/2013     Assessment & Plan  Hyperlipidemia Tolerating statin, encouraged heart healthy diet, avoid trans fats, minimize simple  carbs and saturated fats. Increase exercise as tolerated  Esophageal reflux Avoid offending foods, start probiotics. Do not eat large meals in late evening and consider raising head of bed.   Diabetes mellitus hgba1c acceptable, minimize simple carbs. Increase exercise as tolerated. Continue current meds  Pedal edema Slight, encouraged minimize sodium elevate feet, try compression hose  Overactive bladder Has responded to meds in past but declines today. 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 stable

## 2013-12-10 NOTE — Assessment & Plan Note (Signed)
Avoid offending foods, start probiotics. Do not eat large meals in late evening and consider raising head of bed.  

## 2013-12-10 NOTE — Assessment & Plan Note (Signed)
hgba1c acceptable, minimize simple carbs. Increase exercise as tolerated. Continue current meds 

## 2013-12-10 NOTE — Assessment & Plan Note (Signed)
Has responded to meds in past but declines today. Encouraged Kegel's

## 2013-12-10 NOTE — Assessment & Plan Note (Signed)
stable °

## 2013-12-10 NOTE — Assessment & Plan Note (Signed)
Tolerating statin, encouraged heart healthy diet, avoid trans fats, minimize simple carbs and saturated fats. Increase exercise as tolerated 

## 2013-12-11 MED ORDER — METFORMIN HCL 500 MG PO TABS: 1000.0000 mg | ORAL_TABLET | Freq: Two times a day (BID) | ORAL | Status: DC

## 2013-12-11 NOTE — Addendum Note (Signed)
Addended by: Varney Daily on: 12/11/2013 04:53 PM   Modules accepted: Orders

## 2013-12-14 ENCOUNTER — Ambulatory Visit (INDEPENDENT_AMBULATORY_CARE_PROVIDER_SITE_OTHER): Payer: Medicare HMO | Admitting: Critical Care Medicine

## 2013-12-14 ENCOUNTER — Encounter: Payer: Self-pay | Admitting: Critical Care Medicine

## 2013-12-14 VITALS — BP 132/64 | HR 84 | Temp 98.3°F | Ht 66.0 in | Wt 163.0 lb

## 2013-12-14 DIAGNOSIS — J441 Chronic obstructive pulmonary disease with (acute) exacerbation: Secondary | ICD-10-CM

## 2013-12-14 DIAGNOSIS — J449 Chronic obstructive pulmonary disease, unspecified: Secondary | ICD-10-CM

## 2013-12-14 DIAGNOSIS — J4489 Other specified chronic obstructive pulmonary disease: Secondary | ICD-10-CM

## 2013-12-14 MED ORDER — FLUTTER DEVI: Status: DC

## 2013-12-14 MED ORDER — ALBUTEROL SULFATE (2.5 MG/3ML) 0.083% IN NEBU
2.5000 mg | INHALATION_SOLUTION | Freq: Once | RESPIRATORY_TRACT | Status: AC
  Administered 2013-12-14: 2.5 mg via RESPIRATORY_TRACT

## 2013-12-14 MED ORDER — AZITHROMYCIN 250 MG PO TABS: 250.0000 mg | ORAL_TABLET | Freq: Every day | ORAL | Status: DC

## 2013-12-14 MED ORDER — PREDNISONE 10 MG PO TABS
ORAL_TABLET | ORAL | Status: DC
Start: 2013-12-14 — End: 2014-02-15

## 2013-12-14 NOTE — Progress Notes (Signed)
Subjective:    [name], female    DOB: 1941-02-24, 73 y.o.   MRN: [id number]  HPI   Subjective:    [name], female    DOB: Mar 03, 1941, 73 y.o.   MRN: [id number]  HPI  12/14/2013 Chief Complaint  Patient presents with  . Acute Visit    increased SOB x 2 days, wheezing, chest tightness, and cough with clear to white mucus.  No f/c/s.  Pt worse for two days. MOre wheezing, notes some congestion and mucus increased.  Notes more wheezing. No cigs for two weeks   Review of Systems Constitutional:   No  weight loss, night sweats,  Fevers, chills,  ++fatigue, or  lassitude.  HEENT:   No headaches,  Difficulty swallowing,  Tooth/dental problems, or  Sore throat,                No sneezing, itching, ear ache, ++ nasal congestion, post nasal drip,   CV:  No chest pain,  Orthopnea, PND, swelling in lower extremities, anasarca, dizziness, palpitations, syncope.   GI  No heartburn, indigestion, abdominal pain, nausea, vomiting, diarrhea, change in bowel habits, loss of appetite, bloody stools.   Resp:   No chest wall deformity  Skin: no rash or lesions.  GU: no dysuria, change in color of urine, no urgency or frequency.  No flank pain, no hematuria   MS:  No joint pain or swelling.  No decreased range of motion.  No back pain.  Psych:  No change in mood or affect. No depression or anxiety.  No memory loss.     Physical Exam     Review of Systems    Objective:   Physical Exam Filed Vitals:   12/14/13 1151  BP: 132/64  Pulse: 84  Temp: 98.3 F (36.8 C)  TempSrc: Oral  Height: _0  (1.676 m)  Weight: 163 lb (73.936 kg)  SpO2: 93%    Gen: Pleasant, well-nourished, in no distress,  normal affect  ENT: No lesions,  mouth clear,  oropharynx clear, no postnasal drip  Neck: No JVD, no TMG, no carotid bruits  Lungs: No use of accessory muscles, no dullness to percussion, exp wheeze  Cardiovascular: RRR, heart sounds normal, no murmur  or gallops, no peripheral edema  Abdomen: soft and NT, no HSM,  BS normal  Musculoskeletal: No deformities, no cyanosis or clubbing  Neuro: alert, non focal  Skin: Warm, no lesions or rashes  No results found.        Assessment & Plan:   COPD exacerbation Recurrent copd exac , not smoking for two weeks! Plan Prednisone 48m Take 4 tablets daily for 5 days then stop Azithromycin 2530mTake two once then one daily until gone Rx in office albuterol nebulizer treatment Start flutter valve and use 3-4 times daily Return 2 months    Updated Medication List Outpatient Encounter Prescriptions as of 12/14/2013  Medication Sig  . albuterol (PROVENTIL HFA;VENTOLIN HFA) 108 (90 BASE) MCG/ACT inhaler Inhale 2 puffs into the lungs every 6 (six) hours as needed for wheezing or shortness of breath. Only dispense Ventolin  . Blood Glucose Monitoring Suppl (ONE TOUCH ULTRA SYSTEM KIT) W/DEVICE KIT 1 kit by Does not apply route once.  . Cholecalciferol (VITAMIN D3) 2000 UNITS TABS Take by mouth daily.   . diazepam (VALIUM) 5 MG tablet Take 1 tablet (5 mg total) by mouth daily as needed for anxiety or muscle spasms. 1 daily as needed  .  Fluticasone-Salmeterol (ADVAIR DISKUS) 250-50 MCG/DOSE AEPB INHALE ONE PUFF BY MOUTH TWICE DAILY  . guaiFENesin (MUCINEX) 600 MG 12 hr tablet Take 1 tablet (600 mg total) by mouth at bedtime.  . lovastatin (MEVACOR) 20 MG tablet Take 1 tablet (20 mg total) by mouth at bedtime. 20 mg 1 by mouth daily  . metFORMIN (GLUCOPHAGE) 500 MG tablet Take 500 mg by mouth 2 (two) times daily with a meal.  . Naproxen Sodium (ALEVE) 220 MG CAPS Take by mouth. As needed   . omeprazole (PRILOSEC OTC) 20 MG tablet Take 20 mg by mouth daily.    . ONE TOUCH ULTRA TEST test strip   . [DISCONTINUED] metFORMIN (GLUCOPHAGE) 500 MG tablet Take 2 tablets (1,000 mg total) by mouth 2 (two) times daily with a meal.  . azithromycin (ZITHROMAX) 250 MG tablet Take 1 tablet (250 mg total) by  mouth daily. Take two once then one daily until gone  . predniSONE (DELTASONE) 10 MG tablet Take 4 tablets daily for 5 days then stop  . Respiratory Therapy Supplies (FLUTTER) DEVI Use 3-4 times daily  . [EXPIRED] albuterol (PROVENTIL) (2.5 MG/3ML) 0.083% nebulizer solution 2.5 mg

## 2013-12-14 NOTE — Patient Instructions (Signed)
Prednisone 10mg  Take 4 tablets daily for 5 days then stop Azithromycin 250mg  Take two once then one daily until gone Both above sent to downstairs pharmacy We gave you an albuterol nebulizer treatment Start flutter valve and use 3-4 times daily Return 2 months

## 2013-12-15 NOTE — Assessment & Plan Note (Signed)
Recurrent copd exac , not smoking for two weeks! Plan Prednisone 10mg  Take 4 tablets daily for 5 days then stop Azithromycin 250mg  Take two once then one daily until gone Rx in office albuterol nebulizer treatment Start flutter valve and use 3-4 times daily Return 2 months

## 2014-01-17 ENCOUNTER — Other Ambulatory Visit: Payer: Self-pay | Admitting: Critical Care Medicine

## 2014-01-19 ENCOUNTER — Telehealth: Payer: Self-pay | Admitting: *Deleted

## 2014-01-19 NOTE — Telephone Encounter (Signed)
Forms filled out and for the PA for albuterol HFA.  Have placed these in PW look at to be signed and will need to be faxed back once signed.

## 2014-01-23 NOTE — Telephone Encounter (Signed)
PA request is for Albuterol NEB solution not albuterol HFA. Called [facility] [address], spoke with Nicole Kindred to see if they are filing this under Medicare Part B. Per Nicole Kindred, they do not have pt's Medicare Part B information.  Pt will need to provide them with this information. [email or web address] for pt to see if she has Medicare Part B.  If so, can she take Part B insurance card to [facility] for them to run the Albuterol neb through Medicare Part B?

## 2014-01-25 NOTE — Telephone Encounter (Signed)
Called, spoke with pt.  Explained below to her.  Pt states she does have [place].  She is to take insurance card with part B information to [facility] so they can run the albuterol neb under this.  Pt is aware to call office back if med still does not go through under Part B.  She verbalized understanding of instructions, is in agreement with this plan, and voiced no further questions or concerns at this time.

## 2014-01-31 ENCOUNTER — Telehealth: Payer: Self-pay | Admitting: Critical Care Medicine

## 2014-01-31 NOTE — Telephone Encounter (Signed)
Forms given to Ashley Savage to have PW complete and sign. We will need to fax back.    Pt has pending yearly OV with PW on 02-15-14 at 11:00am in the [facility] office.

## 2014-02-01 ENCOUNTER — Ambulatory Visit (INDEPENDENT_AMBULATORY_CARE_PROVIDER_SITE_OTHER): Payer: Medicare HMO | Admitting: Family Medicine

## 2014-02-01 ENCOUNTER — Encounter: Payer: Self-pay | Admitting: Family Medicine

## 2014-02-01 VITALS — BP 118/60 | HR 86 | Temp 98.2°F | Ht 67.0 in | Wt 164.0 lb

## 2014-02-01 DIAGNOSIS — F32A Depression, unspecified: Secondary | ICD-10-CM

## 2014-02-01 DIAGNOSIS — F17200 Nicotine dependence, unspecified, uncomplicated: Secondary | ICD-10-CM

## 2014-02-01 DIAGNOSIS — Z23 Encounter for immunization: Secondary | ICD-10-CM

## 2014-02-01 DIAGNOSIS — Z720 Tobacco use: Secondary | ICD-10-CM

## 2014-02-01 DIAGNOSIS — F419 Anxiety disorder, unspecified: Secondary | ICD-10-CM

## 2014-02-01 DIAGNOSIS — E785 Hyperlipidemia, unspecified: Secondary | ICD-10-CM

## 2014-02-01 DIAGNOSIS — Z0000 Encounter for general adult medical examination without abnormal findings: Secondary | ICD-10-CM

## 2014-02-01 DIAGNOSIS — L578 Other skin changes due to chronic exposure to nonionizing radiation: Secondary | ICD-10-CM | POA: Insufficient documentation

## 2014-02-01 DIAGNOSIS — E1159 Type 2 diabetes mellitus with other circulatory complications: Secondary | ICD-10-CM

## 2014-02-01 DIAGNOSIS — I714 Abdominal aortic aneurysm, without rupture, unspecified: Secondary | ICD-10-CM

## 2014-02-01 DIAGNOSIS — C50919 Malignant neoplasm of unspecified site of unspecified female breast: Secondary | ICD-10-CM

## 2014-02-01 DIAGNOSIS — F329 Major depressive disorder, single episode, unspecified: Secondary | ICD-10-CM

## 2014-02-01 DIAGNOSIS — J449 Chronic obstructive pulmonary disease, unspecified: Secondary | ICD-10-CM

## 2014-02-01 DIAGNOSIS — F341 Dysthymic disorder: Secondary | ICD-10-CM

## 2014-02-01 HISTORY — DX: Tobacco use: Z72.0

## 2014-02-01 HISTORY — DX: Depression, unspecified: F32.A

## 2014-02-01 HISTORY — DX: Anxiety disorder, unspecified: F41.9

## 2014-02-01 MED ORDER — FLUOXETINE HCL 20 MG PO CAPS
20.0000 mg | ORAL_CAPSULE | Freq: Every day | ORAL | Status: DC
Start: 2014-02-01 — End: 2014-02-28

## 2014-02-01 NOTE — Assessment & Plan Note (Signed)
hgba1c acceptable, minimize simple carbs. Increase exercise as tolerated. Continue current meds 

## 2014-02-01 NOTE — Assessment & Plan Note (Signed)
Follows with Dr Drema Dallas vascular surgery

## 2014-02-01 NOTE — Assessment & Plan Note (Signed)
Follows with oncology

## 2014-02-01 NOTE — Assessment & Plan Note (Signed)
Is estranged from her family and very stressed, started on Fluoxetine

## 2014-02-01 NOTE — Assessment & Plan Note (Signed)
Tolerating statin, encouraged heart healthy diet, avoid trans fats, minimize simple carbs and saturated fats. Increase exercise as tolerated 

## 2014-02-01 NOTE — Assessment & Plan Note (Signed)
Following with pulmonology 

## 2014-02-01 NOTE — Assessment & Plan Note (Signed)
Smokes intermittently Encouraged complete cessation. Discussed need to quit as relates to risk of numerous cancers, cardiac and pulmonary disease as well as neurologic complications. Counseled for greater than 3 minutes

## 2014-02-01 NOTE — Patient Instructions (Signed)
Social Anxiety Disorder Social anxiety disorder, previously called social phobia, is a mental disorder. People with social anxiety disorder frequently feel nervous, afraid, or embarrassed when around other people in social situations. They constantly worry that other people are judging or criticizing them for how they look, what they say, or how they act. They may worry that other people might reject them because of their appearance or behavior. Social anxiety disorder is more than just occasional shyness or self-consciousness. It can cause severe emotional distress. It can interfere with daily life activities. Social anxiety disorder also may lead to excessive alcohol or drug use and even suicide.  Social anxiety disorder is actually one of the most common mental disorders. It can develop at any time but usually starts in the teenage years. Women are more commonly affected than men. Social anxiety disorder is also more common in people who have family members with anxiety disorders. It also is more common in people who have physical deformities or conditions with characteristics that are obvious to others, such as stuttered speech or movement abnormalities (Parkinson disease).  SYMPTOMS  In addition to feeling anxious or fearful in social situations, people with social anxiety disorder frequently have physical symptoms. Examples include:  Red face (blushing).  Racing heart.  Sweating.  Shaky hands or voice.  Confusion.  Lightheadedness.  Upset stomach and diarrhea. DIAGNOSIS  Social anxiety disorder is diagnosed through an assessment by your caregiver. Your caregiver will ask you questions about your mood, thoughts, and reactions in social situations. Your caregiver may ask you about your medical history and use of alcohol or drugs, including prescription medications. Certain medical conditions and the use of certain substances, including caffeine, can cause symptoms similar to social anxiety  disorder. Your caregiver may refer you to a mental health specialist for further evaluation or treatment. The criteria for diagnosis of social anxiety disorder are:  Marked fear or anxiety in one or more social situations in which you may be closely watched or studied by others. Examples of such situations include:  Interacting socially (having a conversation with others, going to a party, or meeting strangers).  Being observed (eating or drinking in public or being called on in class).  Performing in front of others (giving a speech).  The social situations of concern almost always cause fear or anxiety, not just occasionally.  People with social anxiety disorder fear that they will be viewed negatively in a way that will be embarrassing, will lead to rejection, or will offend others. This fear is out of proportion to the actual threat posed by the social situation.  Often the triggering social situations are avoided, or they are endured with intense fear or anxiety. The fear, anxiety, or avoidance is persistent and lasts for 6 months or longer.  The anxiety causes difficulty functioning in at least some parts of your daily life. TREATMENT  Several types of treatment are available for social anxiety disorder. These treatments are often used in combination and include:   Talk therapy. Group talk therapy allows you to see that you are not alone with these problems. Individual talk therapy helps you address your specific anxiety issues with a caring professional. The most effective forms of talk therapy for social anxiety disorder are cognitive-behavioral therapy and exposure therapy. Cognitive-behavioral therapy helps you to identify and change negative thoughts and beliefs that are at the root of the disorder. Exposure therapy allows you to gradually face the situations that you fear most.  Relaxation and coping  techniques. These include deep breathing, self-talk, meditation, visual imagery,  and yoga. Relaxation techniques help to keep you calm in social situations.  Social Company secretary.Social skills can be learned on your own or with the help of a talk therapist. They can help you feel more confident and comfortable in social situations.  Medication. For anxiety limited to performance situations (performance anxiety), medication called beta blockers can help by reducing or preventing the physical symptoms of social anxiety disorder. For more persistent and generalized social anxiety, antidepressant medication may be prescribed to help control symptoms. In severe cases of social anxiety disorder, strong antianxiety medication, called benzodiazepines, may be prescribed on a limited basis and for a short time. Document Released: 06/11/2005 Document Revised: 11/07/2012 Document Reviewed: 10/11/2012 [facility] Patient Information 2015 [place], [place]. This information is not intended to replace advice given to you by your health care provider. Make sure you discuss any questions you have with your health care provider.

## 2014-02-01 NOTE — Assessment & Plan Note (Signed)
Sees Dr Drema Dallas of vascular Sees [facility], Dr Marcha Solders Dr Joya Gaskins, [facility]. Sees [facility] for Opthamology Sees Dr Marin Olp for oncology Patient denies any difficulties at home. No trouble with ADLs, depression or falls. No recent changes to vision or hearing. Is UTD with immunizations. Is UTD with screening. Discussed Advanced Directives, patient agrees to bring [place] copies of documents if can. Encouraged heart healthy diet, exercise as tolerated and adequate sleep. Given Prevnar today. Other screening UTD

## 2014-02-01 NOTE — Assessment & Plan Note (Signed)
Referred to dermatology 

## 2014-02-01 NOTE — Progress Notes (Signed)
[name], female   DOB: 1941/01/28, 73 y.o.   MRN: [id number] Ashley Savage [id number] 09-14-1940 02/01/2014      Progress Note-Follow Up  Subjective  Chief Complaint  Chief Complaint  Patient presents with  . Annual Exam    medicare wellness    HPI  Patient is a 73 year old female in today for routine medical care. She is struggling with increased anxiety and depression. Become estranged from her local family. He will call if you've any as well as fatigue. She notes no recent illness. Does note sograde me low-hearing loss. No recent injury or fall. Denies CP/palp/SOB/HA/congestion/fevers/GI or GU c/o. Taking meds as prescribed Denies suicidal ideation.   Past Medical History  Diagnosis Date  . Emphysema   . Diabetes mellitus type 2  . Hyperlipidemia   . Cancer breast ca  right  . Anxiety   . Panic attacks   . Depression   . Arthritis of both knees 10/01/2013  . Benign paroxysmal positional vertigo 10/01/2013  . Neck pain 10/01/2013  . Esophageal reflux 10/01/2013  . Pedal edema 12/10/2013  . Overactive bladder 12/10/2013    Past Surgical History  Procedure Laterality Date  . Gallbladder surgery  1992  . Breast surgery  2009 right  . Appendectomy  2007  . Knee surgery    . Mandible fracture surgery    . Pilonidal cyst excision    . Tonsillectomy      Family History  Problem Relation Age of Onset  . Heart failure Father   . COPD Father   . Arthritis Father 84  . Pneumonia Sister   . Breast cancer    . Stroke Mother   . Arthritis Mother 28  . Hyperlipidemia Mother   . Hypertension Mother   . Diabetes Mother   . Breast cancer Maternal Aunt   . Alcohol abuse Maternal Uncle     History   Social History  . Marital Status: Single    Spouse Name: N/A    Number of Children: 0  . Years of Education: N/A   Occupational History  . retire    Social History Main Topics  . Smoking status: Former Smoker -- 0.05 packs/day for 48 years    Types: Cigarettes     Start date: 07/27/1964    Quit date: 11/30/2013  . Smokeless tobacco: Never Used     Comment: uses e cigs as well 09/28/13  . Alcohol Use: No  . Drug Use: No  . Sexual Activity: Yes    Birth Control/ Protection: Post-menopausal   Other Topics Concern  . Not on file   Social History Narrative  . No narrative on file    Current Outpatient Prescriptions on File Prior to Visit  Medication Sig Dispense Refill  . albuterol (PROVENTIL HFA;VENTOLIN HFA) 108 (90 BASE) MCG/ACT inhaler Inhale 2 puffs into the lungs every 6 (six) hours as needed for wheezing or shortness of breath. Only dispense Ventolin  1 Inhaler  3  . albuterol (PROVENTIL) (2.5 MG/3ML) 0.083% nebulizer solution Inhale one vial via nebulizer four times daily as needed  375 mL  4  . azithromycin (ZITHROMAX) 250 MG tablet Take 1 tablet (250 mg total) by mouth daily. Take two once then one daily until gone  6 each  0  . Blood Glucose Monitoring Suppl (ONE TOUCH ULTRA SYSTEM KIT) W/DEVICE KIT 1 kit by Does not apply route once.      . Cholecalciferol (VITAMIN D3) 2000  UNITS TABS Take by mouth daily.       . diazepam (VALIUM) 5 MG tablet Take 1 tablet (5 mg total) by mouth daily as needed for anxiety or muscle spasms. 1 daily as needed  30 tablet  3  . Fluticasone-Salmeterol (ADVAIR DISKUS) 250-50 MCG/DOSE AEPB INHALE ONE PUFF BY MOUTH TWICE DAILY  14 each  0  . guaiFENesin (MUCINEX) 600 MG 12 hr tablet Take 1 tablet (600 mg total) by mouth at bedtime.  20 tablet  0  . lovastatin (MEVACOR) 20 MG tablet Take 1 tablet (20 mg total) by mouth at bedtime. 20 mg 1 by mouth daily  90 tablet  1  . metFORMIN (GLUCOPHAGE) 500 MG tablet Take 500 mg by mouth 2 (two) times daily with a meal.      . Naproxen Sodium (ALEVE) 220 MG CAPS Take by mouth. As needed       . omeprazole (PRILOSEC OTC) 20 MG tablet Take 20 mg by mouth daily.        . ONE TOUCH ULTRA TEST test strip       . predniSONE (DELTASONE) 10 MG tablet Take 4 tablets daily for 5  days then stop  20 tablet  0  . Respiratory Therapy Supplies (FLUTTER) DEVI Use 3-4 times daily  1 each  0   No current facility-administered medications on file prior to visit.    Allergies  Allergen Reactions  . Citalopram     Confusion, irregular heart beat.  . Erythromycin     Stomach cramps    Review of Systems  Review of Systems  Constitutional: Positive for malaise/fatigue. Negative for fever.  HENT: Negative for congestion.   Eyes: Negative for discharge.  Respiratory: Positive for shortness of breath.   Cardiovascular: Negative for chest pain, palpitations and leg swelling.  Gastrointestinal: Negative for nausea, abdominal pain and diarrhea.  Genitourinary: Negative for dysuria.  Musculoskeletal: Negative for falls.  Skin: Negative for rash.  Neurological: Negative for loss of consciousness and headaches.  Endo/Heme/Allergies: Negative for polydipsia.  Psychiatric/Behavioral: Negative for depression and suicidal ideas. The patient is not nervous/anxious and does not have insomnia.     Objective  BP 118/60  Pulse 86  Temp(Src) 98.2 F (36.8 C) (Oral)  Ht '5\' 7"'  (1.702 m)  Wt 164 lb (74.39 kg)  BMI 25.68 kg/m2  SpO2 89%  Physical Exam  Physical Exam  Constitutional: She is oriented to person, place, and time and well-developed, well-nourished, and in no distress. No distress.  HENT:  Head: Normocephalic and atraumatic.  Right Ear: External ear normal.  Left Ear: External ear normal.  Nose: Nose normal.  Mouth/Throat: Oropharynx is clear and moist. No oropharyngeal exudate.  Eyes: Conjunctivae are normal. Pupils are equal, round, and reactive to light. Right eye exhibits no discharge. Left eye exhibits no discharge. No scleral icterus.  Neck: Normal range of motion. Neck supple. No thyromegaly present.  Cardiovascular: Normal rate, regular rhythm, normal heart sounds and intact distal pulses.   No murmur heard. Pulmonary/Chest: Effort normal and breath  sounds normal. No respiratory distress. She has no wheezes. She has no rales.  Abdominal: Soft. Bowel sounds are normal. She exhibits no distension and no mass. There is no tenderness.  Musculoskeletal: Normal range of motion. She exhibits no edema and no tenderness.  Lymphadenopathy:    She has no cervical adenopathy.  Neurological: She is alert and oriented to person, place, and time. She has normal reflexes. No cranial nerve deficit. Coordination normal.  Skin: Skin is warm and dry. No rash noted. She is not diaphoretic.  Psychiatric: Mood, memory and affect normal.    Lab Results  Component Value Date   TSH 1.158 12/08/2013   Lab Results  Component Value Date   WBC 4.2 12/08/2013   HGB 14.1 12/08/2013   HCT 40.2 12/08/2013   MCV 90.1 12/08/2013   PLT 125* 12/08/2013   Lab Results  Component Value Date   CREATININE 0.62 12/08/2013   BUN 10 12/08/2013   NA 142 12/08/2013   K 4.2 12/08/2013   CL 104 12/08/2013   CO2 30 12/08/2013   Lab Results  Component Value Date   ALT 22 12/08/2013   AST 32 12/08/2013   ALKPHOS 88 12/08/2013   BILITOT 1.8* 12/08/2013   Lab Results  Component Value Date   CHOL 144 12/08/2013   Lab Results  Component Value Date   HDL 53 12/08/2013   Lab Results  Component Value Date   LDLCALC 75 12/08/2013   Lab Results  Component Value Date   TRIG 81 12/08/2013   Lab Results  Component Value Date   CHOLHDL 2.7 12/08/2013     Assessment & Plan  Sun-damaged skin Referred to dermatology  Breast cancer Follows with oncology  Obstructive chronic bronchitis without exacerbation COPD gold stage C. Following with pulmonology.  Abdominal aortic aneurysm Follows with Dr Drema Dallas vascular surgery  Hyperlipidemia Tolerating statin, encouraged heart healthy diet, avoid trans fats, minimize simple carbs and saturated fats. Increase exercise as tolerated  Diabetes mellitus [id number] acceptable, minimize simple carbs. Increase exercise as tolerated. Continue  current meds  Tobacco abuse disorder Smokes intermittently Encouraged complete cessation. Discussed need to quit as relates to risk of numerous cancers, cardiac and pulmonary disease as well as neurologic complications. Counseled for greater than 3 minutes  Anxiety and depression Is estranged from her family and very stressed, started on Fluoxetine   Medicare annual wellness visit, subsequent Sees Dr Drema Dallas of vascular Sees [facility], Dr Olevia Perches Sees Dr Joya Gaskins, [facility]. Sees [facility] for Opthamology Sees Dr Marin Olp for oncology Patient denies any difficulties at home. No trouble with ADLs, depression or falls. No recent changes to vision or hearing. Is UTD with immunizations. Is UTD with screening. Discussed Advanced Directives, patient agrees to bring [place] copies of documents if can. Encouraged heart healthy diet, exercise as tolerated and adequate sleep. Given Prevnar today. Other screening UTD

## 2014-02-01 NOTE — Progress Notes (Signed)
Pre visit review using our clinic review tool, if applicable. No additional management support is needed unless otherwise documented below in the visit note. 

## 2014-02-07 NOTE — Telephone Encounter (Signed)
Albuterol neb PA faxed to [facility]  Will await approval/denial

## 2014-02-09 NOTE — Telephone Encounter (Signed)
Denial was received for pt's Albuterol Neb.  The denial states: "We denied this request under Medicare Part D because: Medicare Part D has denied you request, however, Medicare Part B has approved coverage/payment for the requested drug(s).  We have reviewed you request for coverage and made a determination about which part of your Medicare benefit will pay for this drug.  We have approved coverage  For this drug the Part B portion of you benefit.  As such, you Part B cost-sharing amount may be different than under Part D.  This approval authorized you coverage through July 26, 2014.  Because we have determined that this drug is covered under your Part B benefit, it is not covered under your Part D prescription drug benefit.  If you have not already filled your prescription for this Part B drug you may do so at a participating network pharmacy.  If you think Medicare Part D should have pain -- you may appeal."  Called [facility] and spoke with Angie to see if mayhap pt brought her Part B info to the pharmacy and if she's already picked up her medication.  Per Angie, pt has not brought the plan info but did go ahead and pay cash price for the Albuterol Neb.  Also per Angie, pt has Part D complete and an appeal was done on the PA because pt does not reside in a SNF.  Pt's Albuterol Neb has now been approved and she will be refunded her money.  Angie has left a message for pt informing her of this.  Pt's copay will be $3.40.  Nothing further needed at this time; will sign off.

## 2014-02-15 ENCOUNTER — Encounter: Payer: Self-pay | Admitting: Critical Care Medicine

## 2014-02-15 ENCOUNTER — Ambulatory Visit (INDEPENDENT_AMBULATORY_CARE_PROVIDER_SITE_OTHER): Payer: Medicare HMO | Admitting: Critical Care Medicine

## 2014-02-15 VITALS — BP 128/66 | HR 86 | Temp 98.0°F | Ht 66.5 in | Wt 160.0 lb

## 2014-02-15 DIAGNOSIS — J449 Chronic obstructive pulmonary disease, unspecified: Secondary | ICD-10-CM

## 2014-02-15 MED ORDER — PREDNISONE 10 MG PO TABS: ORAL_TABLET | ORAL | Status: DC

## 2014-02-15 NOTE — Assessment & Plan Note (Signed)
Gold stage C. COPD with recent exacerbation , now with recurrent exacerbation Plan Take prednisone 10mg  Take 4 for two days three for two days two for two days one for two days (sent to downstairs pharmacy) Stay on advair Stay on nebulizer REturn 4 months

## 2014-02-15 NOTE — Progress Notes (Signed)
Subjective:    [name], female    DOB: 1940-11-06, 73 y.o.   MRN: [id number]  HPI   Subjective:    [name], female    DOB: [birth year], 73 y.o.   MRN: [id number]  HPI  02/15/2014 Chief Complaint  Patient presents with  . 2 month follow up    Breathing has worsened over the summer - has extreme fatigue with activity, fast, shallow breathing, and coughing a lot in the morning with clear to white mucus. No chest tightness/pain.  Pt is slowly worse. Pt gets out early and gets in water and this helps.  Pt has a painful knee.  Walking is harder. Mucus in the AM is clear. Notes signifcant dyspnea with exertion Min chest tightness.  No real qhs dyspnea.  Pt smokes still, 4 cigs every few days, in the AM  Review of Systems Constitutional:   No  weight loss, night sweats,  Fevers, chills,  ++fatigue, or  lassitude.  HEENT:   No headaches,  Difficulty swallowing,  Tooth/dental problems, or  Sore throat,                No sneezing, itching, ear ache, ++ nasal congestion, post nasal drip,   CV:  No chest pain,  Orthopnea, PND, swelling in lower extremities, anasarca, dizziness, palpitations, syncope.   GI  No heartburn, indigestion, abdominal pain, nausea, vomiting, diarrhea, change in bowel habits, loss of appetite, bloody stools.   Resp:   No chest wall deformity  Skin: no rash or lesions.  GU: no dysuria, change in color of urine, no urgency or frequency.  No flank pain, no hematuria   MS:  +++ knee  joint pain ., no swelling.  No decreased range of motion.  No back pain.  Psych:  No change in mood or affect. No depression or anxiety.  No memory loss.     Physical Exam     Review of Systems     Objective:   Physical Exam  Filed Vitals:   02/15/14 1119  BP: 128/66  Pulse: 86  Temp: 98 F (36.7 C)  TempSrc: Oral  Height: 5' 6.5" (1.689 m)  Weight: 160 lb (72.576 kg)  SpO2: 91%    Gen: Pleasant, well-nourished, in no distress,   normal affect  ENT: No lesions,  mouth clear,  oropharynx clear, no postnasal drip  Neck: No JVD, no TMG, no carotid bruits  Lungs: No use of accessory muscles, no dullness to percussion, exp wheeze  Cardiovascular: RRR, heart sounds normal, no murmur or gallops, no peripheral edema  Abdomen: soft and NT, no HSM,  BS normal  Musculoskeletal: No deformities, no cyanosis or clubbing  Neuro: alert, non focal  Skin: Warm, no lesions or rashes  No results found.        Assessment & Plan:   Obstructive chronic bronchitis without exacerbation COPD gold stage C. Gold stage C. COPD with recent exacerbation , now with recurrent exacerbation Plan Take prednisone 45m Take 4 for two days three for two days two for two days one for two days (sent to downstairs pharmacy) Stay on advair Stay on nebulizer REturn 4 months     Updated Medication List Outpatient Encounter Prescriptions as of 02/15/2014  Medication Sig  . albuterol (PROVENTIL HFA;VENTOLIN HFA) 108 (90 BASE) MCG/ACT inhaler Inhale 2 puffs into the lungs every 6 (six) hours as needed for wheezing or shortness of breath. Only dispense Ventolin  . albuterol (  PROVENTIL) (2.5 MG/3ML) 0.083% nebulizer solution Inhale one vial via nebulizer four times daily as needed  . Blood Glucose Monitoring Suppl (ONE TOUCH ULTRA SYSTEM KIT) W/DEVICE KIT 1 kit by Does not apply route once.  . Cholecalciferol (VITAMIN D3) 2000 UNITS TABS Take by mouth daily.   . diazepam (VALIUM) 5 MG tablet Take 1 tablet (5 mg total) by mouth daily as needed for anxiety or muscle spasms. 1 daily as needed  . Fluticasone-Salmeterol (ADVAIR DISKUS) 250-50 MCG/DOSE AEPB INHALE ONE PUFF BY MOUTH TWICE DAILY  . lovastatin (MEVACOR) 20 MG tablet Take 1 tablet (20 mg total) by mouth at bedtime. 20 mg 1 by mouth daily  . metFORMIN (GLUCOPHAGE) 500 MG tablet Take 500 mg by mouth 2 (two) times daily with a meal.  . Naproxen Sodium (ALEVE) 220 MG CAPS Take by mouth. As  needed   . omeprazole (PRILOSEC OTC) 20 MG tablet Take 20 mg by mouth daily.    . ONE TOUCH ULTRA TEST test strip   . Respiratory Therapy Supplies (FLUTTER) DEVI Use 3-4 times daily as needed  . [DISCONTINUED] Respiratory Therapy Supplies (FLUTTER) DEVI Use 3-4 times daily  . FLUoxetine (PROZAC) 20 MG capsule Take 1 capsule (20 mg total) by mouth daily.  Marland Kitchen guaiFENesin (MUCINEX) 600 MG 12 hr tablet Take 1 tablet (600 mg total) by mouth at bedtime.  . predniSONE (DELTASONE) 10 MG tablet Take 4 for two days three for two days two for two days one for two days  . [DISCONTINUED] predniSONE (DELTASONE) 10 MG tablet Take 4 tablets daily for 5 days then stop

## 2014-02-15 NOTE — Patient Instructions (Addendum)
Take prednisone 10mg  Take 4 for two days three for two days two for two days one for two days (sent to downstairs pharmacy) Stay on advair Stay on nebulizer REturn 4 months

## 2014-02-20 ENCOUNTER — Other Ambulatory Visit: Payer: Self-pay | Admitting: Critical Care Medicine

## 2014-02-20 ENCOUNTER — Telehealth: Payer: Self-pay | Admitting: Critical Care Medicine

## 2014-02-20 MED ORDER — FLUTICASONE-SALMETEROL 250-50 MCG/DOSE IN AEPB: INHALATION_SPRAY | RESPIRATORY_TRACT | Status: DC

## 2014-02-20 NOTE — Telephone Encounter (Signed)
Pt aware RX has been sent in. Nothing further needed 

## 2014-02-22 ENCOUNTER — Telehealth: Payer: Self-pay | Admitting: Family Medicine

## 2014-02-22 ENCOUNTER — Encounter: Payer: Self-pay | Admitting: Physician Assistant

## 2014-02-22 ENCOUNTER — Ambulatory Visit (INDEPENDENT_AMBULATORY_CARE_PROVIDER_SITE_OTHER): Payer: Medicare HMO | Admitting: Physician Assistant

## 2014-02-22 VITALS — BP 100/62 | HR 93 | Temp 98.0°F | Resp 20 | Ht 66.5 in | Wt 157.2 lb

## 2014-02-22 DIAGNOSIS — J441 Chronic obstructive pulmonary disease with (acute) exacerbation: Secondary | ICD-10-CM

## 2014-02-22 MED ORDER — ALBUTEROL SULFATE (2.5 MG/3ML) 0.083% IN NEBU
2.5000 mg | INHALATION_SOLUTION | Freq: Once | RESPIRATORY_TRACT | Status: AC
  Administered 2014-02-22: 2.5 mg via RESPIRATORY_TRACT

## 2014-02-22 MED ORDER — METHYLPREDNISOLONE (PAK) 4 MG PO TABS: ORAL_TABLET | ORAL | Status: DC

## 2014-02-22 NOTE — Progress Notes (Signed)
Pre visit review using our clinic review tool, if applicable. No additional management support is needed unless otherwise documented below in the visit note/SLS  

## 2014-02-22 NOTE — Assessment & Plan Note (Signed)
Nebulizer treatment given in office with some improvement in wheeze.  Patient to continue COPD medications.  Rx Medrol dose pack given.  Continue metformin as directed. Limit carb intake. Stay well hydrated.  Rest.  Plain Mucinex. Call or RTC if no symptom improvement within 24 hours.

## 2014-02-22 NOTE — Addendum Note (Signed)
Addended by: Rockwell Germany on: 02/22/2014 02:00 PM   Modules accepted: Orders

## 2014-02-22 NOTE — Patient Instructions (Signed)
Please take Medrol dose pack as directed.  Continue mucinex and asthma medications.  Avoid heat exposure. Stay well hydrated.  Rest.  Call or return to clinic if no improvement in symptoms in 48 hours.

## 2014-02-22 NOTE — Progress Notes (Signed)
Patient presents to clinic today c/o increased SOB, wheezing and chest tightness since Monday.  Denies excessive cough, pleuritic chest pain, fever, chills or aches.  Is a smoker but has not had a cigarette in a few days.  Denies recent travel or sick contact.  Endorses taking her medications for COPD as directed.  Is a diabetic but sugars well controlled with Metformin BID.  Past Medical History  Diagnosis Date  . Emphysema   . Diabetes mellitus type 2  . Hyperlipidemia   . Cancer breast ca  right  . Anxiety   . Panic attacks   . Depression   . Arthritis of both knees 10/01/2013  . Benign paroxysmal positional vertigo 10/01/2013  . Neck pain 10/01/2013  . Esophageal reflux 10/01/2013  . Pedal edema 12/10/2013  . Overactive bladder 12/10/2013  . Tobacco abuse disorder 02/01/2014    Current Outpatient Prescriptions on File Prior to Visit  Medication Sig Dispense Refill  . albuterol (PROVENTIL HFA;VENTOLIN HFA) 108 (90 BASE) MCG/ACT inhaler Inhale 2 puffs into the lungs every 6 (six) hours as needed for wheezing or shortness of breath. Only dispense Ventolin  1 Inhaler  3  . albuterol (PROVENTIL) (2.5 MG/3ML) 0.083% nebulizer solution Inhale one vial via nebulizer four times daily as needed  375 mL  4  . Blood Glucose Monitoring Suppl (ONE TOUCH ULTRA SYSTEM KIT) W/DEVICE KIT 1 kit by Does not apply route once.      . Cholecalciferol (VITAMIN D3) 2000 UNITS TABS Take by mouth daily.       . diazepam (VALIUM) 5 MG tablet Take 1 tablet (5 mg total) by mouth daily as needed for anxiety or muscle spasms. 1 daily as needed  30 tablet  3  . Fluticasone-Salmeterol (ADVAIR DISKUS) 250-50 MCG/DOSE AEPB INHALE ONE PUFF BY MOUTH TWICE DAILY  14 each  0  . lovastatin (MEVACOR) 20 MG tablet Take 1 tablet (20 mg total) by mouth at bedtime. 20 mg 1 by mouth daily  90 tablet  1  . metFORMIN (GLUCOPHAGE) 500 MG tablet Take 500 mg by mouth 2 (two) times daily with a meal.      . Naproxen Sodium (ALEVE) 220 MG CAPS  Take by mouth. As needed       . omeprazole (PRILOSEC OTC) 20 MG tablet Take 20 mg by mouth daily.        . ONE TOUCH ULTRA TEST test strip       . Respiratory Therapy Supplies (FLUTTER) DEVI Use 3-4 times daily as needed      . FLUoxetine (PROZAC) 20 MG capsule Take 1 capsule (20 mg total) by mouth daily.  30 capsule  3   No current facility-administered medications on file prior to visit.    Allergies  Allergen Reactions  . Citalopram     Confusion, irregular heart beat.  . Erythromycin     Stomach cramps    Family History  Problem Relation Age of Onset  . Heart failure Father   . COPD Father   . Arthritis Father 65  . Pneumonia Sister   . Breast cancer    . Stroke Mother   . Arthritis Mother 32  . Hyperlipidemia Mother   . Hypertension Mother   . Diabetes Mother   . Breast cancer Maternal Aunt   . Alcohol abuse Maternal Uncle     History   Social History  . Marital Status: Single    Spouse Name: N/A    Number of Children: 0  .  Years of Education: N/A   Occupational History  . retire    Social History Main Topics  . Smoking status: Current Some Day Smoker -- 0.05 packs/day    Types: Cigarettes    Start date: 07/27/1964  . Smokeless tobacco: Never Used     Comment: uses e cigs as well 09/28/13  . Alcohol Use: No  . Drug Use: No  . Sexual Activity: Yes    Birth Control/ Protection: Post-menopausal   Other Topics Concern  . None   Social History Narrative  . None   Review of Systems - See HPI.  All other ROS are negative.  BP 100/62  Pulse 93  Temp(Src) 98 F (36.7 C) (Oral)  Resp 20  Ht 5' 6.5" (1.689 m)  Wt 157 lb 4 oz (71.328 kg)  BMI 25.00 kg/m2  SpO2 93%  Physical Exam  Vitals reviewed. Constitutional: She is oriented to person, place, and time and well-developed, well-nourished, and in no distress.  HENT:  Head: Normocephalic and atraumatic.  Right Ear: External ear normal.  Left Ear: External ear normal.  Nose: Nose normal.   Mouth/Throat: Oropharynx is clear and moist. No oropharyngeal exudate.  Eyes: Conjunctivae are normal. Pupils are equal, round, and reactive to light.  Neck: Neck supple.  Cardiovascular: Normal rate, regular rhythm, normal heart sounds and intact distal pulses.   Pulmonary/Chest: Effort normal. No respiratory distress. She has wheezes. She has no rales. She exhibits no tenderness.  Lymphadenopathy:    She has no cervical adenopathy.  Neurological: She is alert and oriented to person, place, and time.  Skin: Skin is warm and dry. No rash noted.  Psychiatric: Affect normal.    Recent Results (from the past 2160 hour(s))  LIPID PANEL     Status: None   Collection Time    12/08/13 10:20 AM      Result Value Ref Range   Cholesterol 144  0 - 200 mg/dL   Comment: ATP III Classification:           < 200        mg/dL        Desirable          200 - 239     mg/dL        Borderline High          >= 240        mg/dL        High         Triglycerides 81  <150 mg/dL   HDL 53  >39 mg/dL   Total CHOL/HDL Ratio 2.7     VLDL 16  0 - 40 mg/dL   LDL Cholesterol 75  0 - 99 mg/dL   Comment:       Total Cholesterol/HDL Ratio:CHD Risk                            Coronary Heart Disease Risk Table                                            Men       Women              1/2 Average Risk              3.4  3.3                  Average Risk              5.0        4.4               2X Average Risk              9.6        7.1               3X Average Risk             23.4       11.0     Use the calculated Patient Ratio above and the CHD Risk table      to determine the patient's CHD Risk.     ATP III Classification (LDL):           < 100        mg/dL         Optimal          100 - 129     mg/dL         Near or Above Optimal          130 - 159     mg/dL         Borderline High          160 - 189     mg/dL         High           > 190        mg/dL         Very High        HEMOGLOBIN A1C      Status: Abnormal   Collection Time    12/08/13 10:20 AM      Result Value Ref Range   Hemoglobin A1C 7.2 (*) <5.7 %   Comment:                                                                            According to the ADA Clinical Practice Recommendations for 2011, when     HbA1c is used as a screening test:             >=6.5%   Diagnostic of Diabetes Mellitus                (if abnormal result is confirmed)           5.7-6.4%   Increased risk of developing Diabetes Mellitus           References:Diagnosis and Classification of Diabetes Mellitus,Diabetes     BRAX,0940,76(KGSUP 1):S62-S69 and Standards of Medical Care in             Diabetes - 2011,Diabetes Care,2011,34 (Suppl 1):S11-S61.         Mean Plasma Glucose 160 (*) <117 mg/dL  TSH     Status: None   Collection Time    12/08/13 10:20 AM      Result Value Ref Range   TSH 1.158  0.350 - 4.500 uIU/mL  CBC     Status: Abnormal  Collection Time    12/08/13 10:20 AM      Result Value Ref Range   WBC 4.2  4.0 - 10.5 K/uL   RBC 4.46  3.87 - 5.11 MIL/uL   Hemoglobin 14.1  12.0 - 15.0 g/dL   HCT 40.2  36.0 - 46.0 %   MCV 90.1  78.0 - 100.0 fL   MCH 31.6  26.0 - 34.0 pg   MCHC 35.1  30.0 - 36.0 g/dL   RDW 14.3  11.5 - 15.5 %   Platelets 125 (*) 150 - 400 K/uL  RENAL FUNCTION PANEL     Status: Abnormal   Collection Time    12/08/13 10:20 AM      Result Value Ref Range   Sodium 142  135 - 145 mEq/L   Potassium 4.2  3.5 - 5.3 mEq/L   Chloride 104  96 - 112 mEq/L   CO2 30  19 - 32 mEq/L   Glucose, Bld 114 (*) 70 - 99 mg/dL   BUN 10  6 - 23 mg/dL   Creat 0.62  0.50 - 1.10 mg/dL   Albumin 3.5  3.5 - 5.2 g/dL   Calcium 9.0  8.4 - 10.5 mg/dL   Phosphorus 4.1  2.3 - 4.6 mg/dL  MICROALBUMIN / CREATININE URINE RATIO     Status: None   Collection Time    12/08/13 10:20 AM      Result Value Ref Range   Microalb, Ur 0.83  0.00 - 1.89 mg/dL   Creatinine, Urine 198.0     Microalb Creat Ratio 4.2  0.0 - 30.0 mg/g  HEPATIC  FUNCTION PANEL     Status: Abnormal   Collection Time    12/08/13 10:20 AM      Result Value Ref Range   Total Bilirubin 1.8 (*) 0.2 - 1.2 mg/dL   Bilirubin, Direct 0.4 (*) 0.0 - 0.3 mg/dL   Indirect Bilirubin 1.4 (*) 0.2 - 1.2 mg/dL   Alkaline Phosphatase 88  39 - 117 U/L   AST 32  0 - 37 U/L   ALT 22  0 - 35 U/L   Total Protein 5.8 (*) 6.0 - 8.3 g/dL   Albumin 3.5  3.5 - 5.2 g/dL    Assessment/Plan: COPD exacerbation Nebulizer treatment given in office with some improvement in wheeze.  Patient to continue COPD medications.  Rx Medrol dose pack given.  Continue metformin as directed. Limit carb intake. Stay well hydrated.  Rest.  Plain Mucinex. Call or RTC if no symptom improvement within 24 hours.

## 2014-02-22 NOTE — Telephone Encounter (Signed)
Patient Information:  Caller Name: Versie  Phone: [phone]  Patient: Ashley Savage  Gender: Female  DOB: 09/06/1940  Age: 73 Years  PCP: Penni Homans [facility])  Office Follow Up:  Does the office need to follow up with this patient?: No  Instructions For The Office: N/A   Symptoms  Reason For Call & Symptoms: Pt reports she is having difficulity keep O2 Sat above 90%.  Pt reports she feels she needs O2.  Reviewed Health History In EMR: Yes  Reviewed Medications In EMR: Yes  Reviewed Allergies In EMR: Yes  Reviewed Surgeries / Procedures: Yes  Date of Onset of Symptoms: 02/19/2014  Guideline(s) Used:  Breathing Difficulty  Disposition Per Guideline:   Go to Office Now  Reason For Disposition Reached:   Longstanding difficulty breathing (e.g., CHF, COPD, emphysema) and worse than normal  Advice Given:  Call Back If:  You become worse.  Patient Will Follow Care Advice:  YES  Appointment Scheduled:  02/22/2014 11:00:00 Appointment Scheduled Provider:  Raiford Noble "Einar Pheasant"

## 2014-02-26 LAB — HM DIABETES EYE EXAM

## 2014-02-28 ENCOUNTER — Telehealth: Payer: Self-pay | Admitting: Family Medicine

## 2014-02-28 ENCOUNTER — Ambulatory Visit (HOSPITAL_BASED_OUTPATIENT_CLINIC_OR_DEPARTMENT_OTHER)
Admission: RE | Admit: 2014-02-28 | Discharge: 2014-02-28 | Disposition: A | Discharge status: Home / Self Care | Payer: Medicare HMO | Source: Ambulatory Visit | Attending: Physician Assistant | Admitting: Physician Assistant

## 2014-02-28 ENCOUNTER — Encounter: Payer: Self-pay | Admitting: Physician Assistant

## 2014-02-28 ENCOUNTER — Ambulatory Visit (INDEPENDENT_AMBULATORY_CARE_PROVIDER_SITE_OTHER): Payer: Medicare HMO | Admitting: Physician Assistant

## 2014-02-28 ENCOUNTER — Telehealth: Payer: Self-pay | Admitting: Physician Assistant

## 2014-02-28 VITALS — BP 106/58 | HR 92 | Temp 98.2°F | Resp 22 | Ht 67.5 in | Wt 154.5 lb

## 2014-02-28 DIAGNOSIS — J309 Allergic rhinitis, unspecified: Secondary | ICD-10-CM

## 2014-02-28 DIAGNOSIS — J302 Other seasonal allergic rhinitis: Secondary | ICD-10-CM

## 2014-02-28 DIAGNOSIS — J441 Chronic obstructive pulmonary disease with (acute) exacerbation: Secondary | ICD-10-CM

## 2014-02-28 MED ORDER — MONTELUKAST SODIUM 10 MG PO TABS: 10.0000 mg | ORAL_TABLET | Freq: Every day | ORAL | Status: DC

## 2014-02-28 MED ORDER — IPRATROPIUM-ALBUTEROL 0.5-2.5 (3) MG/3ML IN SOLN
3.0000 mL | RESPIRATORY_TRACT | Status: DC
  Administered 2014-02-28: 3 mL via RESPIRATORY_TRACT

## 2014-02-28 NOTE — Patient Instructions (Signed)
Please begin taking Advair twice daily.  This is very important.  Please take Albuterol every 4-6 hours over the past week.   Take Singulair daily as I believe there is an allergic contribution to this.  Continue to keep from smoking.  Follow-up with Dr. Joya Gaskins if symptoms do not continue to improve.  Please go downstairs for x-ray before leaving the building.  I will call you with your results.

## 2014-02-28 NOTE — Progress Notes (Signed)
Pre visit review using our clinic review tool, if applicable. No additional management support is needed unless otherwise documented below in the visit note/SLS  

## 2014-02-28 NOTE — Assessment & Plan Note (Signed)
Resolving.  Some residual symptoms this AM exacerbated by anxiety.  Duoneb x 1 given in office.  Wheezing much improved. O2 recheck at 95% oxygen.  Discussed importance of taking Advair twice daily, along with albuterol PRN.  Rx Singulair given due to likely allergic component to reactive airway. Recommended continued smoking cessation.  Follow-up if symptoms are not continuing to improve.  Follow-up with Pulmonology as scheduled.

## 2014-02-28 NOTE — Progress Notes (Signed)
Patient presents to clinic today to follow-up concerning recent COPD exacerbation.  Patient was seen last week and given Rx for medrol dose pack to take as directed.  Also instructed to stop smoking, continue daily COPD medications, increase fluid intake and continue plain Mucinex.  Patient endorses improvement in symptoms over the first few days of steroid.  States she then felt jittery and hot with the steroid, and her CBGs have been elevated in the 300s.  Patient discontinued medication.  CBGs have returned to 120-130 range. Patient endorses cough has resolved.  Is trying to avoid being outdoors.  States she has been having trouble with breathing again today after going outside.  Denies chest pain but endorses chest tightness.  Also having some mild anxiety due to recent exacerbation over COPD.  Patient does endorse taking her albuterol as needed, but states she forgets to use her Advair a lot.   Past Medical History  Diagnosis Date  . Emphysema   . Diabetes mellitus type 2  . Hyperlipidemia   . Cancer breast ca  right  . Anxiety   . Panic attacks   . Depression   . Arthritis of both knees 10/01/2013  . Benign paroxysmal positional vertigo 10/01/2013  . Neck pain 10/01/2013  . Esophageal reflux 10/01/2013  . Pedal edema 12/10/2013  . Overactive bladder 12/10/2013  . Tobacco abuse disorder 02/01/2014    Current Outpatient Prescriptions on File Prior to Visit  Medication Sig Dispense Refill  . albuterol (PROVENTIL HFA;VENTOLIN HFA) 108 (90 BASE) MCG/ACT inhaler Inhale 2 puffs into the lungs every 6 (six) hours as needed for wheezing or shortness of breath. Only dispense Ventolin  1 Inhaler  3  . albuterol (PROVENTIL) (2.5 MG/3ML) 0.083% nebulizer solution Inhale one vial via nebulizer four times daily as needed  375 mL  4  . Blood Glucose Monitoring Suppl (ONE TOUCH ULTRA SYSTEM KIT) W/DEVICE KIT 1 kit by Does not apply route once.      . Cholecalciferol (VITAMIN D3) 2000 UNITS TABS Take by mouth  daily.       . diazepam (VALIUM) 5 MG tablet Take 1 tablet (5 mg total) by mouth daily as needed for anxiety or muscle spasms. 1 daily as needed  30 tablet  3  . Fluticasone-Salmeterol (ADVAIR DISKUS) 250-50 MCG/DOSE AEPB INHALE ONE PUFF BY MOUTH TWICE DAILY  14 each  0  . guaiFENesin (MUCINEX) 600 MG 12 hr tablet Take 600 mg by mouth 3 times/day as needed-between meals & bedtime.      . lovastatin (MEVACOR) 20 MG tablet Take 1 tablet (20 mg total) by mouth at bedtime. 20 mg 1 by mouth daily  90 tablet  1  . metFORMIN (GLUCOPHAGE) 500 MG tablet Take 500 mg by mouth 2 (two) times daily with a meal.      . Naproxen Sodium (ALEVE) 220 MG CAPS Take by mouth. As needed       . omeprazole (PRILOSEC OTC) 20 MG tablet Take 20 mg by mouth daily.        . ONE TOUCH ULTRA TEST test strip       . Respiratory Therapy Supplies (FLUTTER) DEVI Use 3-4 times daily as needed       No current facility-administered medications on file prior to visit.    Allergies  Allergen Reactions  . Citalopram     Confusion, irregular heart beat.  . Erythromycin     Stomach cramps    Family History  Problem Relation Age of  Onset  . Heart failure Father   . COPD Father   . Arthritis Father 33  . Pneumonia Sister   . Breast cancer    . Stroke Mother   . Arthritis Mother 45  . Hyperlipidemia Mother   . Hypertension Mother   . Diabetes Mother   . Breast cancer Maternal Aunt   . Alcohol abuse Maternal Uncle     History   Social History  . Marital Status: Single    Spouse Name: N/A    Number of Children: 0  . Years of Education: N/A   Occupational History  . retire    Social History Main Topics  . Smoking status: Current Some Day Smoker -- 0.05 packs/day    Types: Cigarettes    Start date: 07/27/1964  . Smokeless tobacco: Never Used     Comment: uses e cigs as well 09/28/13  . Alcohol Use: No  . Drug Use: No  . Sexual Activity: Yes    Birth Control/ Protection: Post-menopausal   Other Topics  Concern  . None   Social History Narrative  . None    Review of Systems - See HPI.  All other ROS are negative.  BP 106/58  Pulse 92  Temp(Src) 98.2 F (36.8 C) (Oral)  Resp 22  Ht 5' 7.5" (1.715 m)  Wt 154 lb 8 oz (70.081 kg)  BMI 23.83 kg/m2  SpO2 92%  Physical Exam  Vitals reviewed. Constitutional: She is oriented to person, place, and time and well-developed, well-nourished, and in no distress.  HENT:  Head: Normocephalic and atraumatic.  Right Ear: External ear normal.  Left Ear: External ear normal.  Nose: Nose normal.  Mouth/Throat: Oropharynx is clear and moist. No oropharyngeal exudate.  TM within normal limits bilaterally.  Eyes: Conjunctivae and EOM are normal. Pupils are equal, round, and reactive to light.  Neck: Neck supple.  Cardiovascular: Normal rate, regular rhythm, normal heart sounds and intact distal pulses.   Pulmonary/Chest: Effort normal. No respiratory distress. She has wheezes. She has no rales. She exhibits no tenderness.  Lymphadenopathy:    She has no cervical adenopathy.  Neurological: She is alert and oriented to person, place, and time.  Skin: Skin is warm and dry. No rash noted.  Psychiatric:  Slightly anxious affect and beginning of interview/examination. Improved during visit.    Recent Results (from the past 2160 hour(s))  LIPID PANEL     Status: None   Collection Time    12/08/13 10:20 AM      Result Value Ref Range   Cholesterol 144  0 - 200 mg/dL   Comment: ATP III Classification:           < 200        mg/dL        Desirable          200 - 239     mg/dL        Borderline High          >= 240        mg/dL        High         Triglycerides 81  <150 mg/dL   HDL 53  >39 mg/dL   Total CHOL/HDL Ratio 2.7     VLDL 16  0 - 40 mg/dL   LDL Cholesterol 75  0 - 99 mg/dL   Comment:       Total Cholesterol/HDL Ratio:CHD Risk  Coronary Heart Disease Risk Table                                            Men        Women              1/2 Average Risk              3.4        3.3                  Average Risk              5.0        4.4               2X Average Risk              9.6        7.1               3X Average Risk             23.4       11.0     Use the calculated Patient Ratio above and the CHD Risk table      to determine the patient's CHD Risk.     ATP III Classification (LDL):           < 100        mg/dL         Optimal          100 - 129     mg/dL         Near or Above Optimal          130 - 159     mg/dL         Borderline High          160 - 189     mg/dL         High           > 190        mg/dL         Very High        HEMOGLOBIN A1C     Status: Abnormal   Collection Time    12/08/13 10:20 AM      Result Value Ref Range   Hemoglobin A1C 7.2 (*) <5.7 %   Comment:                                                                            According to the ADA Clinical Practice Recommendations for 2011, when     HbA1c is used as a screening test:             >=6.5%   Diagnostic of Diabetes Mellitus                (if abnormal result is confirmed)           5.7-6.4%   Increased risk of developing Diabetes Mellitus           References:Diagnosis and Classification of  Diabetes Mellitus,Diabetes     QZES,9233,00(TMAUQ 1):S62-S69 and Standards of Medical Care in             Diabetes - 2011,Diabetes JFHL,4562,56 (Suppl 1):S11-S61.         Mean Plasma Glucose 160 (*) <117 mg/dL  TSH     Status: None   Collection Time    12/08/13 10:20 AM      Result Value Ref Range   TSH 1.158  0.350 - 4.500 uIU/mL  CBC     Status: Abnormal   Collection Time    12/08/13 10:20 AM      Result Value Ref Range   WBC 4.2  4.0 - 10.5 K/uL   RBC 4.46  3.87 - 5.11 MIL/uL   Hemoglobin 14.1  12.0 - 15.0 g/dL   HCT 40.2  36.0 - 46.0 %   MCV 90.1  78.0 - 100.0 fL   MCH 31.6  26.0 - 34.0 pg   MCHC 35.1  30.0 - 36.0 g/dL   RDW 14.3  11.5 - 15.5 %   Platelets 125 (*) 150 - 400 K/uL  RENAL FUNCTION  PANEL     Status: Abnormal   Collection Time    12/08/13 10:20 AM      Result Value Ref Range   Sodium 142  135 - 145 mEq/L   Potassium 4.2  3.5 - 5.3 mEq/L   Chloride 104  96 - 112 mEq/L   CO2 30  19 - 32 mEq/L   Glucose, Bld 114 (*) 70 - 99 mg/dL   BUN 10  6 - 23 mg/dL   Creat 0.62  0.50 - 1.10 mg/dL   Albumin 3.5  3.5 - 5.2 g/dL   Calcium 9.0  8.4 - 10.5 mg/dL   Phosphorus 4.1  2.3 - 4.6 mg/dL  MICROALBUMIN / CREATININE URINE RATIO     Status: None   Collection Time    12/08/13 10:20 AM      Result Value Ref Range   Microalb, Ur 0.83  0.00 - 1.89 mg/dL   Creatinine, Urine 198.0     Microalb Creat Ratio 4.2  0.0 - 30.0 mg/g  HEPATIC FUNCTION PANEL     Status: Abnormal   Collection Time    12/08/13 10:20 AM      Result Value Ref Range   Total Bilirubin 1.8 (*) 0.2 - 1.2 mg/dL   Bilirubin, Direct 0.4 (*) 0.0 - 0.3 mg/dL   Indirect Bilirubin 1.4 (*) 0.2 - 1.2 mg/dL   Alkaline Phosphatase 88  39 - 117 U/L   AST 32  0 - 37 U/L   ALT 22  0 - 35 U/L   Total Protein 5.8 (*) 6.0 - 8.3 g/dL   Albumin 3.5  3.5 - 5.2 g/dL    Assessment/Plan: COPD exacerbation Resolving.  Some residual symptoms this AM exacerbated by anxiety.  Duoneb x 1 given in office.  Wheezing much improved. O2 recheck at 95% oxygen.  Discussed importance of taking Advair twice daily, along with albuterol PRN.  Rx Singulair given due to likely allergic component to reactive airway. Recommended continued smoking cessation.  Follow-up if symptoms are not continuing to improve.  Follow-up with Pulmonology as scheduled.

## 2014-02-28 NOTE — Telephone Encounter (Signed)
LMOM with instructions -- CXR without evidence of pneumonia.  Continue care as discussed at today's visit.

## 2014-02-28 NOTE — Telephone Encounter (Signed)
Patient Information:  Caller Name: Shealeigh  Phone: [phone]  Patient: Ashley Savage  Gender: Female  DOB: Feb 06, 1941  Age: 73 Years  PCP: Penni Homans [facility])  Office Follow Up:  Does the office need to follow up with this patient?: No  Instructions For The Office: N/A  RN Note:  Pt. scheduled for 15:45 today with Raiford Noble to assess her breathing. She is worried she has pneumonia.  Symptoms  Reason For Call & Symptoms: Pt. is calling with breathing difficulty. Hx of COPD. Has been in the office several times in the last week. Was given Prednisone but stopped it because it "set her on fire". Also blood sugars started to run over 400. Now today(02/28/14), shaking and sleepy. SOB at rest. Has used the Nebulizer but did not help much.Pulse ox 90%. Blood Sugar 137 fasting this morning.  Reviewed Health History In EMR: Yes  Reviewed Medications In EMR: Yes  Reviewed Allergies In EMR: Yes  Reviewed Surgeries / Procedures: Yes  Date of Onset of Symptoms: 02/22/2014  Treatments Tried: Prednisone  Treatments Tried Worked: Yes  Guideline(s) Used:  Breathing Difficulty  Disposition Per Guideline:   Go to Office Now  Reason For Disposition Reached:   Longstanding difficulty breathing (e.g., CHF, COPD, emphysema) and worse than normal  Advice Given:  Call Back If:  Severe difficulty breathing occurs  Patient Will Follow Care Advice:  YES  Appointment Scheduled:  02/28/2014 15:45:00 Appointment Scheduled Provider:  Raiford Noble "Einar Pheasant"

## 2014-03-09 ENCOUNTER — Telehealth: Payer: Self-pay

## 2014-03-09 NOTE — Telephone Encounter (Signed)
Pt stated that "she needs invoice of some proof of having annual physical in July."LDM

## 2014-03-09 NOTE — Telephone Encounter (Signed)
Left message for patient to return my call.

## 2014-03-09 NOTE — Telephone Encounter (Signed)
Spoke to patient and will mail this out to her

## 2014-03-14 ENCOUNTER — Telehealth: Payer: Self-pay

## 2014-03-14 NOTE — Telephone Encounter (Signed)
Pt stated that "she wanted to report that the Singulair is working great. She is very pleased." LDM

## 2014-03-23 ENCOUNTER — Other Ambulatory Visit: Payer: Medicare HMO | Admitting: Lab

## 2014-03-23 ENCOUNTER — Ambulatory Visit: Payer: Medicare HMO | Admitting: Hematology & Oncology

## 2014-03-23 ENCOUNTER — Encounter: Payer: Self-pay | Admitting: Internal Medicine

## 2014-03-28 ENCOUNTER — Ambulatory Visit (HOSPITAL_BASED_OUTPATIENT_CLINIC_OR_DEPARTMENT_OTHER): Payer: Medicare HMO | Admitting: Hematology & Oncology

## 2014-03-28 ENCOUNTER — Encounter: Payer: Self-pay | Admitting: Hematology & Oncology

## 2014-03-28 ENCOUNTER — Other Ambulatory Visit (HOSPITAL_BASED_OUTPATIENT_CLINIC_OR_DEPARTMENT_OTHER): Payer: Medicare HMO | Admitting: Lab

## 2014-03-28 VITALS — BP 137/64 | HR 76 | Temp 97.0°F | Resp 18 | Ht 67.0 in | Wt 160.0 lb

## 2014-03-28 DIAGNOSIS — C50919 Malignant neoplasm of unspecified site of unspecified female breast: Secondary | ICD-10-CM

## 2014-03-28 DIAGNOSIS — C50419 Malignant neoplasm of upper-outer quadrant of unspecified female breast: Secondary | ICD-10-CM

## 2014-03-28 DIAGNOSIS — E559 Vitamin D deficiency, unspecified: Secondary | ICD-10-CM

## 2014-03-28 DIAGNOSIS — C50911 Malignant neoplasm of unspecified site of right female breast: Secondary | ICD-10-CM

## 2014-03-28 DIAGNOSIS — Z853 Personal history of malignant neoplasm of breast: Secondary | ICD-10-CM

## 2014-03-28 DIAGNOSIS — D696 Thrombocytopenia, unspecified: Secondary | ICD-10-CM

## 2014-03-28 LAB — CBC WITH DIFFERENTIAL (CANCER CENTER ONLY)
BASO#: 0 10*3/uL (ref 0.0–0.2)
BASO%: 0.7 % (ref 0.0–2.0)
EOS%: 2.8 % (ref 0.0–7.0)
Eosinophils Absolute: 0.1 10*3/uL (ref 0.0–0.5)
HEMATOCRIT: 40.9 % (ref 34.8–46.6)
HEMOGLOBIN: 13.8 g/dL (ref 11.6–15.9)
LYMPH#: 1.2 10*3/uL (ref 0.9–3.3)
LYMPH%: 26.6 % (ref 14.0–48.0)
MCH: 31.9 pg (ref 26.0–34.0)
MCHC: 33.7 g/dL (ref 32.0–36.0)
MCV: 95 fL (ref 81–101)
MONO#: 0.8 10*3/uL (ref 0.1–0.9)
MONO%: 18.2 % — AB (ref 0.0–13.0)
NEUT#: 2.2 10*3/uL (ref 1.5–6.5)
NEUT%: 51.7 % (ref 39.6–80.0)
Platelets: 127 10*3/uL — ABNORMAL LOW (ref 145–400)
RBC: 4.33 10*6/uL (ref 3.70–5.32)
RDW: 14.1 % (ref 11.1–15.7)
WBC: 4.3 10*3/uL (ref 3.9–10.0)

## 2014-03-28 LAB — CMP (CANCER CENTER ONLY)
ALBUMIN: 3.3 g/dL (ref 3.3–5.5)
ALT: 23 U/L (ref 10–47)
AST: 32 U/L (ref 11–38)
Alkaline Phosphatase: 85 U/L — ABNORMAL HIGH (ref 26–84)
BUN, Bld: 8 mg/dL (ref 7–22)
CALCIUM: 9.3 mg/dL (ref 8.0–10.3)
CHLORIDE: 104 meq/L (ref 98–108)
CO2: 30 meq/L (ref 18–33)
Creat: 0.5 mg/dl — ABNORMAL LOW (ref 0.6–1.2)
Glucose, Bld: 148 mg/dL — ABNORMAL HIGH (ref 73–118)
Potassium: 3.7 mEq/L (ref 3.3–4.7)
Sodium: 142 mEq/L (ref 128–145)
Total Bilirubin: 1.1 mg/dl (ref 0.20–1.60)
Total Protein: 6.2 g/dL — ABNORMAL LOW (ref 6.4–8.1)

## 2014-03-28 MED ORDER — INFLUENZA VAC SPLIT QUAD 0.5 ML IM SUSY
0.5000 mL | PREFILLED_SYRINGE | Freq: Once | INTRAMUSCULAR | Status: DC
  Filled 2014-03-28: qty 0.5

## 2014-03-28 NOTE — Patient Instructions (Signed)
Smoking Cessation Quitting smoking is important to your health and has many advantages. However, it is not always easy to quit since nicotine is a very addictive drug. Oftentimes, people try 3 times or more before being able to quit. This document explains the best ways for you to prepare to quit smoking. Quitting takes hard work and a lot of effort, but you can do it. ADVANTAGES OF QUITTING SMOKING  You will live longer, feel better, and live better.  Your body will feel the impact of quitting smoking almost immediately.  Within 20 minutes, blood pressure decreases. Your pulse returns to its normal level.  After 8 hours, carbon monoxide levels in the blood return to normal. Your oxygen level increases.  After 24 hours, the chance of having a heart attack starts to decrease. Your breath, hair, and body stop smelling like smoke.  After 48 hours, damaged nerve endings begin to recover. Your sense of taste and smell improve.  After 72 hours, the body is virtually free of nicotine. Your bronchial tubes relax and breathing becomes easier.  After 2 to 12 weeks, lungs can hold more air. Exercise becomes easier and circulation improves.  The risk of having a heart attack, stroke, cancer, or lung disease is greatly reduced.  After 1 year, the risk of coronary heart disease is cut in half.  After 5 years, the risk of stroke falls to the same as a nonsmoker.  After 10 years, the risk of lung cancer is cut in half and the risk of other cancers decreases significantly.  After 15 years, the risk of coronary heart disease drops, usually to the level of a nonsmoker.  If you are pregnant, quitting smoking will improve your chances of having a healthy baby.  The people you live with, especially any children, will be healthier.  You will have extra money to spend on things other than cigarettes. QUESTIONS TO THINK ABOUT BEFORE ATTEMPTING TO QUIT You may want to talk about your answers with your  health care provider.  Why do you want to quit?  If you tried to quit in the past, what helped and what did not?  What will be the most difficult situations for you after you quit? How will you plan to handle them?  Who can help you through the tough times? Your family? Friends? A health care provider?  What pleasures do you get from smoking? What ways can you still get pleasure if you quit? Here are some questions to ask your health care provider:  How can you help me to be successful at quitting?  What medicine do you think would be best for me and how should I take it?  What should I do if I need more help?  What is smoking withdrawal like? How can I get information on withdrawal? GET READY  Set a quit date.  Change your environment by getting rid of all cigarettes, ashtrays, matches, and lighters in your home, car, or work. Do not let people smoke in your home.  Review your past attempts to quit. Think about what worked and what did not. GET SUPPORT AND ENCOURAGEMENT You have a better chance of being successful if you have help. You can get support in many ways.  Tell your family, friends, and coworkers that you are going to quit and need their support. Ask them not to smoke around you.  Get individual, group, or telephone counseling and support. Programs are available at local hospitals and health centers. Call   your local health department for information about programs in your area.  Spiritual beliefs and practices may help some smokers quit.  Download a "quit meter" on your computer to keep track of quit statistics, such as how long you have gone without smoking, cigarettes not smoked, and money saved.  Get a self-help book about quitting smoking and staying off tobacco. LEARN NEW SKILLS AND BEHAVIORS  Distract yourself from urges to smoke. Talk to someone, go for a walk, or occupy your time with a task.  Change your normal routine. Take a different route to work.  Drink tea instead of coffee. Eat breakfast in a different place.  Reduce your stress. Take a hot bath, exercise, or read a book.  Plan something enjoyable to do every day. Reward yourself for not smoking.  Explore interactive web-based programs that specialize in helping you quit. GET MEDICINE AND USE IT CORRECTLY Medicines can help you stop smoking and decrease the urge to smoke. Combining medicine with the above behavioral methods and support can greatly increase your chances of successfully quitting smoking.  Nicotine replacement therapy helps deliver nicotine to your body without the negative effects and risks of smoking. Nicotine replacement therapy includes nicotine gum, lozenges, inhalers, nasal sprays, and skin patches. Some may be available over-the-counter and others require a prescription.  Antidepressant medicine helps people abstain from smoking, but how this works is unknown. This medicine is available by prescription.  Nicotinic receptor partial agonist medicine simulates the effect of nicotine in your brain. This medicine is available by prescription. Ask your health care provider for advice about which medicines to use and how to use them based on your health history. Your health care provider will tell you what side effects to look out for if you choose to be on a medicine or therapy. Carefully read the information on the package. Do not use any other product containing nicotine while using a nicotine replacement product.  RELAPSE OR DIFFICULT SITUATIONS Most relapses occur within the first 3 months after quitting. Do not be discouraged if you start smoking again. Remember, most people try several times before finally quitting. You may have symptoms of withdrawal because your body is used to nicotine. You may crave cigarettes, be irritable, feel very hungry, cough often, get headaches, or have difficulty concentrating. The withdrawal symptoms are only temporary. They are strongest  when you first quit, but they will go away within 10-14 days. To reduce the chances of relapse, try to:  Avoid drinking alcohol. Drinking lowers your chances of successfully quitting.  Reduce the amount of caffeine you consume. Once you quit smoking, the amount of caffeine in your body increases and can give you symptoms, such as a rapid heartbeat, sweating, and anxiety.  Avoid smokers because they can make you want to smoke.  Do not let weight gain distract you. Many smokers will gain weight when they quit, usually less than 10 pounds. Eat a healthy diet and stay active. You can always lose the weight gained after you quit.  Find ways to improve your mood other than smoking. FOR MORE INFORMATION  www.smokefree.gov  Document Released: 07/07/2001 Document Revised: 11/27/2013 Document Reviewed: 10/22/2011 [employer] Patient Information 2015 [employer]. This information is not intended to replace advice given to you by your health care provider. Make sure you discuss any questions you have with your health care provider.  

## 2014-03-29 LAB — VITAMIN D 25 HYDROXY (VIT D DEFICIENCY, FRACTURES): Vit D, 25-Hydroxy: 52 ng/mL (ref 30–89)

## 2014-03-29 NOTE — Progress Notes (Signed)
  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 We last saw her back in February. Unfortunately, she has an exacerbation of her COPD. She is pretty bad emphysema. She has nebulizers at home. She underwent a chest x-ray. This was done in the emergency room. There was no evidence of fluid or obvious pneumonia.    She unfortunately has had issues with her family. She moved to the area to be with her family. Again, there have been issues with respect to several family members that have been incredibly irritating.    Otherwise, she seems to be doing okay. She is a little "jittery" from the prednisone.   She's had no bleeding. She's had no nausea vomiting.  Her last mammogram was in December of 2014. This was unremarkable.  PHYSICAL EXAMINATION: Slightly ill-appearing white female in no obvious distress. She is coughing quite a bit. Her vital signs show temperature of 98 pulse is 90 expiratory rate is 16 and blood pressure 131/48. Weight is 172. Head and neck exam shows no ocular or oral lesions. She has no adenopathy in the neck. Lungs show good breath sounds bilaterally. Some wheezes are noted. Cardiac exam regular rate and rhythm with no murmurs rubs or bruits. Abdomen is soft. Has good bowel sounds. There is no fluid wave. There is no palpable liver or spleen. Breast exam shows left breast with no masses edema or erythema. There is no left axillary adenopathy. Right chest wall shows well healed mastectomy. No nodules or erythema is noted on the right chest wall. There is no right axillary adenopathy. Extremities shows no clubbing cyanosis or edema. No lymphedema is noted in the right arm. Skin exam is  unremarkable.   LABORATORY STUDIES:  White cell count 5.4. Hemoglobin 13.4. Platelet count 117.   IMPRESSION: She is a 73 year old white female with history of stage II a ductal carcinoma of the right breast. Her tumor is ER positive. She has completed Femara for 5 years.  We will continue to follow her along every 6 months. I do not see need for any additional labs or x-rays.  I think that her lung disease will be the main determinate of her prognosis.  She did talk to me about a friend of her's. She worries that her friend may have cancer. Her friend is not have a family doctor. Apparently, she says it will take about 4 or 5 months for her to be seen by a family doctor. I told Ms.Laurie dad all she needed to do was call our office and we can get her seen.  Volanda Napoleon, MD 03/29/2014

## 2014-03-30 ENCOUNTER — Ambulatory Visit (INDEPENDENT_AMBULATORY_CARE_PROVIDER_SITE_OTHER): Payer: Medicare HMO | Admitting: Physician Assistant

## 2014-03-30 ENCOUNTER — Encounter: Payer: Self-pay | Admitting: Physician Assistant

## 2014-03-30 VITALS — BP 120/60 | HR 87 | Temp 98.3°F | Wt 160.0 lb

## 2014-03-30 DIAGNOSIS — R600 Localized edema: Secondary | ICD-10-CM | POA: Insufficient documentation

## 2014-03-30 DIAGNOSIS — R609 Edema, unspecified: Secondary | ICD-10-CM

## 2014-03-30 MED ORDER — FUROSEMIDE 20 MG PO TABS: 20.0000 mg | ORAL_TABLET | Freq: Every day | ORAL | Status: DC

## 2014-03-30 NOTE — Patient Instructions (Signed)
Please take Lasix daily as directed.  Limit salt intake.  Wear compression stockings daily. Continue other medications as directed. I will check on the price of the new branded metformin for you. Follow-up in 1-2 weeks for reassessment.  Peripheral Edema You have swelling in your legs (peripheral edema). This swelling is due to excess accumulation of salt and water in your body. Edema may be a sign of heart, kidney or liver disease, or a side effect of a medication. It may also be due to problems in the leg veins. Elevating your legs and using special support stockings may be very helpful, if the cause of the swelling is due to poor venous circulation. Avoid long periods of standing, whatever the cause. Treatment of edema depends on identifying the cause. Chips, pretzels, pickles and other salty foods should be avoided. Restricting salt in your diet is almost always needed. Water pills (diuretics) are often used to remove the excess salt and water from your body via urine. These medicines prevent the kidney from reabsorbing sodium. This increases urine flow. Diuretic treatment may also result in lowering of potassium levels in your body. Potassium supplements may be needed if you have to use diuretics daily. Daily weights can help you keep track of your progress in clearing your edema. You should call your caregiver for follow up care as recommended. SEEK IMMEDIATE MEDICAL CARE IF:   You have increased swelling, pain, redness, or heat in your legs.  You develop shortness of breath, especially when lying down.  You develop chest or abdominal pain, weakness, or fainting.  You have a fever. Document Released: 08/20/2004 Document Revised: 10/05/2011 Document Reviewed: 07/31/2009 [facility] Patient Information 2015 [place], [place]. This information is not intended to replace advice given to you by your health care provider. Make sure you discuss any questions you have with your health care provider.

## 2014-03-30 NOTE — Progress Notes (Signed)
Patient presents to clinic today c/o swelling and weeping of lower extremities bilaterally over the past few days.  Endorses chronic mild edema of legs bilaterally.  Weeping is new for her.  Denies increased SOB from her baseline.  Notes this is actually much improve with addition Singulair.  Denies redness, warmth or tenderness of extremities.  Has been told before to wear compression stockings, but has not been doing this.  Past Medical History  Diagnosis Date  . Emphysema   . Diabetes mellitus type 2  . Hyperlipidemia   . Cancer breast ca  right  . Anxiety   . Panic attacks   . Depression   . Arthritis of both knees 10/01/2013  . Benign paroxysmal positional vertigo 10/01/2013  . Neck pain 10/01/2013  . Esophageal reflux 10/01/2013  . Pedal edema 12/10/2013  . Overactive bladder 12/10/2013  . Tobacco abuse disorder 02/01/2014    Current Outpatient Prescriptions on File Prior to Visit  Medication Sig Dispense Refill  . albuterol (PROVENTIL HFA;VENTOLIN HFA) 108 (90 BASE) MCG/ACT inhaler Inhale 2 puffs into the lungs every 6 (six) hours as needed for wheezing or shortness of breath. Only dispense Ventolin  1 Inhaler  3  . albuterol (PROVENTIL) (2.5 MG/3ML) 0.083% nebulizer solution Inhale one vial via nebulizer four times daily as needed  375 mL  4  . Blood Glucose Monitoring Suppl (ONE TOUCH ULTRA SYSTEM KIT) W/DEVICE KIT 1 kit by Does not apply route once.      . Cholecalciferol (VITAMIN D3) 2000 UNITS TABS Take by mouth daily.       . diazepam (VALIUM) 5 MG tablet Take 1 tablet (5 mg total) by mouth daily as needed for anxiety or muscle spasms. 1 daily as needed  30 tablet  3  . Fluticasone-Salmeterol (ADVAIR DISKUS) 250-50 MCG/DOSE AEPB INHALE ONE PUFF BY MOUTH TWICE DAILY  14 each  0  . lovastatin (MEVACOR) 20 MG tablet Take 1 tablet (20 mg total) by mouth at bedtime. 20 mg 1 by mouth daily  90 tablet  1  . metFORMIN (GLUCOPHAGE) 500 MG tablet Take 500 mg by mouth 2 (two) times daily with a  meal.      . montelukast (SINGULAIR) 10 MG tablet Take 1 tablet (10 mg total) by mouth at bedtime.  30 tablet  3  . Naproxen Sodium (ALEVE) 220 MG CAPS Take by mouth. As needed       . omeprazole (PRILOSEC OTC) 20 MG tablet Take 20 mg by mouth daily.        . ONE TOUCH ULTRA TEST test strip by Other route as needed.       Marland Kitchen Respiratory Therapy Supplies (FLUTTER) DEVI Use 3-4 times daily as needed       No current facility-administered medications on file prior to visit.    Allergies  Allergen Reactions  . Citalopram     Confusion, irregular heart beat.  . Erythromycin     Stomach cramps    Family History  Problem Relation Age of Onset  . Heart failure Father   . COPD Father   . Arthritis Father 47  . Pneumonia Sister   . Breast cancer    . Stroke Mother   . Arthritis Mother 24  . Hyperlipidemia Mother   . Hypertension Mother   . Diabetes Mother   . Breast cancer Maternal Aunt   . Alcohol abuse Maternal Uncle     History   Social History  . Marital Status: Single  Spouse Name: N/A    Number of Children: 0  . Years of Education: N/A   Occupational History  . retire    Social History Main Topics  . Smoking status: Current Some Day Smoker -- 0.05 packs/day    Types: Cigarettes    Start date: 07/27/1964  . Smokeless tobacco: Never Used     Comment: still smoking  03-28-14  . Alcohol Use: No  . Drug Use: No  . Sexual Activity: Yes    Birth Control/ Protection: Post-menopausal   Other Topics Concern  . None   Social History Narrative  . None   Review of Systems - See HPI.  All other ROS are negative.  BP 120/60  Pulse 87  Temp(Src) 98.3 F (36.8 C)  Wt 160 lb (72.576 kg)  SpO2 95%  Physical Exam  Vitals reviewed. Constitutional: She is oriented to person, place, and time and well-developed, well-nourished, and in no distress.  HENT:  Head: Normocephalic and atraumatic.  Right Ear: External ear normal.  Left Ear: External ear normal.  Nose: Nose  normal.  Mouth/Throat: Oropharynx is clear and moist. No oropharyngeal exudate.  Eyes: Conjunctivae are normal.  Cardiovascular: Normal rate, regular rhythm, normal heart sounds and intact distal pulses.   Pulmonary/Chest: Effort normal and breath sounds normal. No respiratory distress. She has no wheezes. She has no rales. She exhibits no tenderness.  Neurological: She is alert and oriented to person, place, and time.  Skin: Skin is warm and dry. No rash noted.  Psychiatric: Affect normal.   Recent Results (from the past 2160 hour(s))  CBC WITH DIFFERENTIAL ([facility])     Status: Abnormal   Collection Time    03/28/14 10:08 AM      Result Value Ref Range   WBC 4.3  3.9 - 10.0 10e3/uL   RBC 4.33  3.70 - 5.32 10e6/uL   HGB 13.8  11.6 - 15.9 g/dL   HCT 40.9  34.8 - 46.6 %   MCV 95  81 - 101 fL   MCH 31.9  26.0 - 34.0 pg   MCHC 33.7  32.0 - 36.0 g/dL   RDW 14.1  11.1 - 15.7 %   Platelets 127 (*) 145 - 400 10e3/uL   NEUT# 2.2  1.5 - 6.5 10e3/uL   LYMPH# 1.2  0.9 - 3.3 10e3/uL   MONO# 0.8  0.1 - 0.9 10e3/uL   Eosinophils Absolute 0.1  0.0 - 0.5 10e3/uL   BASO# 0.0  0.0 - 0.2 10e3/uL   NEUT% 51.7  39.6 - 80.0 %   LYMPH% 26.6  14.0 - 48.0 %   MONO% 18.2 (*) 0.0 - 13.0 %   EOS% 2.8  0.0 - 7.0 %   BASO% 0.7  0.0 - 2.0 %  COMPREHENSIVE METABOLIC PANEL ([facility] REFLEX ONLY)     Status: Abnormal   Collection Time    03/28/14 10:08 AM      Result Value Ref Range   Sodium 142  128 - 145 mEq/L   Potassium 3.7  3.3 - 4.7 mEq/L   Chloride 104  98 - 108 mEq/L   CO2 30  18 - 33 mEq/L   Glucose, Bld 148 (*) 73 - 118 mg/dL   BUN, Bld 8  7 - 22 mg/dL   Creat 0.5 (*) 0.6 - 1.2 mg/dl   Total Bilirubin 1.10  0.20 - 1.60 mg/dl   Alkaline Phosphatase 85 (*) 26 - 84 U/L   AST 32  11 -  38 U/L   ALT(SGPT) 23  10 - 47 U/L   Total Protein 6.2 (*) 6.4 - 8.1 g/dL   Albumin 3.3  3.3 - 5.5 g/dL   Calcium 9.3  8.0 - 10.3 mg/dL  VITAMIN D 25 HYDROXY     Status: None   Collection Time    03/28/14  10:08 AM      Result Value Ref Range   Vit D, 25-Hydroxy 52  30 - 89 ng/mL   Comment: This assay accurately quantifies Vitamin D, which is the sum of the25-Hydroxy forms of Vitamin D2 and D3.  Studies have shown that theoptimum concentration of 25-Hydroxy Vitamin D is 30 ng/mL or higher. Concentrations of Vitamin D between 20 and 29 ng/mL      are considered tobe insufficient and concentrations less than 20 ng/mL are consideredto be deficient for Vitamin D.   Assessment/Plan: Peripheral edema Rx Lasix 20 mg daily.  Compression stockings. Follow-up in 2 weeks with PCP as scheduled.  May need repeat echocardiogram if swelling persistent.

## 2014-03-30 NOTE — Assessment & Plan Note (Signed)
Rx Lasix 20 mg daily.  Compression stockings. Follow-up in 2 weeks with PCP as scheduled.  May need repeat echocardiogram if swelling persistent.

## 2014-03-30 NOTE — Progress Notes (Signed)
Pre visit review using our clinic review tool, if applicable. No additional management support is needed unless otherwise documented below in the visit note. 

## 2014-04-12 ENCOUNTER — Ambulatory Visit (INDEPENDENT_AMBULATORY_CARE_PROVIDER_SITE_OTHER): Payer: Medicare HMO | Admitting: Family Medicine

## 2014-04-12 ENCOUNTER — Ambulatory Visit: Payer: Medicare HMO | Admitting: Family Medicine

## 2014-04-12 ENCOUNTER — Encounter: Payer: Self-pay | Admitting: Family Medicine

## 2014-04-12 VITALS — BP 111/52 | HR 94 | Temp 98.0°F | Ht 67.0 in | Wt 155.8 lb

## 2014-04-12 DIAGNOSIS — R0609 Other forms of dyspnea: Secondary | ICD-10-CM

## 2014-04-12 DIAGNOSIS — J449 Chronic obstructive pulmonary disease, unspecified: Secondary | ICD-10-CM

## 2014-04-12 DIAGNOSIS — Z720 Tobacco use: Secondary | ICD-10-CM

## 2014-04-12 DIAGNOSIS — R0989 Other specified symptoms and signs involving the circulatory and respiratory systems: Secondary | ICD-10-CM

## 2014-04-12 DIAGNOSIS — E785 Hyperlipidemia, unspecified: Secondary | ICD-10-CM

## 2014-04-12 DIAGNOSIS — R0600 Dyspnea, unspecified: Secondary | ICD-10-CM

## 2014-04-12 DIAGNOSIS — Z1211 Encounter for screening for malignant neoplasm of colon: Secondary | ICD-10-CM

## 2014-04-12 DIAGNOSIS — E11628 Type 2 diabetes mellitus with other skin complications: Secondary | ICD-10-CM

## 2014-04-12 DIAGNOSIS — K219 Gastro-esophageal reflux disease without esophagitis: Secondary | ICD-10-CM

## 2014-04-12 DIAGNOSIS — E1169 Type 2 diabetes mellitus with other specified complication: Secondary | ICD-10-CM

## 2014-04-12 DIAGNOSIS — F17200 Nicotine dependence, unspecified, uncomplicated: Secondary | ICD-10-CM

## 2014-04-12 DIAGNOSIS — L988 Other specified disorders of the skin and subcutaneous tissue: Secondary | ICD-10-CM

## 2014-04-12 NOTE — Progress Notes (Signed)
Pre visit review using our clinic review tool, if applicable. No additional management support is needed unless otherwise documented below in the visit note. 

## 2014-04-12 NOTE — Patient Instructions (Signed)

## 2014-04-13 LAB — LIPID PANEL
CHOL/HDL RATIO: 3
Cholesterol: 167 mg/dL (ref 0–200)
HDL: 52.3 mg/dL (ref >39.00)
LDL Cholesterol: 98 mg/dL (ref 0–99)
NonHDL: 114.7
Triglycerides: 83 mg/dL (ref 0.0–149.0)
VLDL: 16.6 mg/dL (ref 0.0–40.0)

## 2014-04-13 LAB — HEMOGLOBIN A1C: HEMOGLOBIN A1C: 6.8 % — AB (ref 4.6–6.5)

## 2014-04-15 ENCOUNTER — Encounter: Payer: Self-pay | Admitting: Family Medicine

## 2014-04-15 NOTE — Assessment & Plan Note (Signed)
Encouraged complete cessation. Discussed need to quit as relates to risk of numerous cancers, cardiac and pulmonary disease as well as neurologic complications. Counseled for greater than 3 minutes 

## 2014-04-15 NOTE — Assessment & Plan Note (Signed)
Avoid offending foods, start probiotics. Do not eat large meals in late evening and consider raising head of bed.  

## 2014-04-15 NOTE — Assessment & Plan Note (Signed)
Doing better with addition of Singulair no changes.

## 2014-04-15 NOTE — Assessment & Plan Note (Signed)
Tolerating statin, encouraged heart healthy diet, avoid trans fats, minimize simple carbs and saturated fats. Increase exercise as tolerated 

## 2014-04-15 NOTE — Progress Notes (Signed)
[name], female   DOB: February 02, 1941, 73 y.o.   MRN: [id number] Ashley Savage [id number] 06-01-1941 04/15/2014      Progress Note-Follow Up  Subjective  Chief Complaint  Chief Complaint  Patient presents with  . Follow-up    10 month    HPI  Patient is a 73 year old female in today for routine medical care. Patient is a little followup. She notes her breathing is better since Singulair. Her pulse ox has been in the 90s. She did have some trouble with edema with first a limited that has resolved now. Reports blood sugar this morning was 72. No polyuria or polydipsia. He continues to struggle with knee pain no recent trauma or swelling. Well controlled, no changes to meds. Encouraged heart healthy diet such as the DASH diet and exercise as tolerated.   Past Medical History  Diagnosis Date  . Emphysema   . Diabetes mellitus type 2  . Hyperlipidemia   . Cancer breast ca  right  . Anxiety   . Panic attacks   . Depression   . Arthritis of both knees 10/01/2013  . Benign paroxysmal positional vertigo 10/01/2013  . Neck pain 10/01/2013  . Esophageal reflux 10/01/2013  . Pedal edema 12/10/2013  . Overactive bladder 12/10/2013  . Tobacco abuse disorder 02/01/2014  . COPD (chronic obstructive pulmonary disease) 10/01/2013    Past Surgical History  Procedure Laterality Date  . Gallbladder surgery  1992  . Breast surgery  2009 right  . Appendectomy  2007  . Knee surgery    . Mandible fracture surgery    . Pilonidal cyst excision    . Tonsillectomy      Family History  Problem Relation Age of Onset  . Heart failure Father   . COPD Father   . Arthritis Father 82  . Pneumonia Sister   . Breast cancer    . Stroke Mother   . Arthritis Mother 59  . Hyperlipidemia Mother   . Hypertension Mother   . Diabetes Mother   . Breast cancer Maternal Aunt   . Alcohol abuse Maternal Uncle     History   Social History  . Marital Status: Single    Spouse Name: N/A    Number of  Children: 0  . Years of Education: N/A   Occupational History  . retire    Social History Main Topics  . Smoking status: Current Some Day Smoker -- 0.05 packs/day    Types: Cigarettes    Start date: 07/27/1964  . Smokeless tobacco: Never Used     Comment: still smoking  03-28-14  . Alcohol Use: No  . Drug Use: No  . Sexual Activity: Yes    Birth Control/ Protection: Post-menopausal   Other Topics Concern  . Not on file   Social History Narrative  . No narrative on file    Current Outpatient Prescriptions on File Prior to Visit  Medication Sig Dispense Refill  . albuterol (PROVENTIL HFA;VENTOLIN HFA) 108 (90 BASE) MCG/ACT inhaler Inhale 2 puffs into the lungs every 6 (six) hours as needed for wheezing or shortness of breath. Only dispense Ventolin  1 Inhaler  3  . albuterol (PROVENTIL) (2.5 MG/3ML) 0.083% nebulizer solution Inhale one vial via nebulizer four times daily as needed  375 mL  4  . Blood Glucose Monitoring Suppl (ONE TOUCH ULTRA SYSTEM KIT) W/DEVICE KIT 1 kit by Does not apply route once.      . Cholecalciferol (VITAMIN D3)  2000 UNITS TABS Take by mouth daily.       . diazepam (VALIUM) 5 MG tablet Take 1 tablet (5 mg total) by mouth daily as needed for anxiety or muscle spasms. 1 daily as needed  30 tablet  3  . Fluticasone-Salmeterol (ADVAIR DISKUS) 250-50 MCG/DOSE AEPB INHALE ONE PUFF BY MOUTH TWICE DAILY  14 each  0  . furosemide (LASIX) 20 MG tablet Take 1 tablet (20 mg total) by mouth daily.  30 tablet  1  . lovastatin (MEVACOR) 20 MG tablet Take 1 tablet (20 mg total) by mouth at bedtime. 20 mg 1 by mouth daily  90 tablet  1  . metFORMIN (GLUCOPHAGE) 500 MG tablet Take 500 mg by mouth 2 (two) times daily with a meal.      . montelukast (SINGULAIR) 10 MG tablet Take 1 tablet (10 mg total) by mouth at bedtime.  30 tablet  3  . Naproxen Sodium (ALEVE) 220 MG CAPS Take by mouth. As needed       . omeprazole (PRILOSEC OTC) 20 MG tablet Take 20 mg by mouth daily.         . ONE TOUCH ULTRA TEST test strip by Other route as needed.       Marland Kitchen Respiratory Therapy Supplies (FLUTTER) DEVI Use 3-4 times daily as needed       No current facility-administered medications on file prior to visit.    Allergies  Allergen Reactions  . Citalopram     Confusion, irregular heart beat.  . Erythromycin     Stomach cramps    Review of Systems  Review of Systems  Constitutional: Negative for fever and malaise/fatigue.  HENT: Negative for congestion.   Eyes: Negative for discharge.  Respiratory: Negative for shortness of breath.   Cardiovascular: Positive for leg swelling. Negative for chest pain and palpitations.  Gastrointestinal: Negative for nausea, abdominal pain and diarrhea.  Genitourinary: Negative for dysuria.  Musculoskeletal: Negative for falls.  Skin: Negative for rash.  Neurological: Negative for loss of consciousness and headaches.  Endo/Heme/Allergies: Negative for polydipsia.  Psychiatric/Behavioral: Negative for depression and suicidal ideas. The patient is not nervous/anxious and does not have insomnia.     Objective  BP 111/52  Pulse 94  Temp(Src) 98 F (36.7 C) (Oral)  Ht '5\' 7"'  (1.702 m)  Wt 155 lb 12.8 oz (70.67 kg)  BMI 24.40 kg/m2  SpO2 97%  Physical Exam  Physical Exam  Constitutional: She is oriented to person, place, and time and well-developed, well-nourished, and in no distress. No distress.  HENT:  Head: Normocephalic and atraumatic.  Eyes: Conjunctivae are normal.  Neck: Neck supple. No thyromegaly present.  Cardiovascular: Normal rate, regular rhythm and normal heart sounds.   No murmur heard. Pulmonary/Chest: Effort normal and breath sounds normal. She has no wheezes.  Abdominal: She exhibits no distension and no mass.  Musculoskeletal: She exhibits edema.  Trace edema b/l feet  Lymphadenopathy:    She has no cervical adenopathy.  Neurological: She is alert and oriented to person, place, and time.  Skin: Skin is  warm and dry. No rash noted. She is not diaphoretic.  Psychiatric: Memory, affect and judgment normal.    Lab Results  Component Value Date   TSH 1.158 12/08/2013   Lab Results  Component Value Date   WBC 4.3 03/28/2014   HGB 13.8 03/28/2014   HCT 40.9 03/28/2014   MCV 95 03/28/2014   PLT 127* 03/28/2014   Lab Results  Component Value  Date   CREATININE 0.5* 03/28/2014   BUN 8 03/28/2014   NA 142 03/28/2014   K 3.7 03/28/2014   CL 104 03/28/2014   CO2 30 03/28/2014   Lab Results  Component Value Date   ALT 23 03/28/2014   AST 32 03/28/2014   ALKPHOS 85* 03/28/2014   BILITOT 1.10 03/28/2014   Lab Results  Component Value Date   CHOL 167 04/12/2014   Lab Results  Component Value Date   HDL 52.30 04/12/2014   Lab Results  Component Value Date   LDLCALC 98 04/12/2014   Lab Results  Component Value Date   TRIG 83.0 04/12/2014   Lab Results  Component Value Date   CHOLHDL 3 04/12/2014     Assessment & Plan  Diabetes mellitus hgba1c acceptable, minimize simple carbs. Increase exercise as tolerated. Continue current meds  Esophageal reflux Avoid offending foods, start probiotics. Do not eat large meals in late evening and consider raising head of bed.   Hyperlipidemia Tolerating statin, encouraged heart healthy diet, avoid trans fats, minimize simple carbs and saturated fats. Increase exercise as tolerated  Tobacco abuse disorder Encouraged complete cessation. Discussed need to quit as relates to risk of numerous cancers, cardiac and pulmonary disease as well as neurologic complications. Counseled for greater than 3 minutes  COPD (chronic obstructive pulmonary disease) Doing better with addition of Singulair no changes.

## 2014-04-15 NOTE — Assessment & Plan Note (Signed)
hgba1c acceptable, minimize simple carbs. Increase exercise as tolerated. Continue current meds 

## 2014-04-18 ENCOUNTER — Inpatient Hospital Stay (HOSPITAL_BASED_OUTPATIENT_CLINIC_OR_DEPARTMENT_OTHER): Admission: RE | Admit: 2014-04-18 | Payer: Medicare HMO | Source: Ambulatory Visit

## 2014-04-24 ENCOUNTER — Telehealth: Payer: Self-pay | Admitting: Family Medicine

## 2014-04-24 NOTE — Telephone Encounter (Signed)
Pt is requesting appt for her yearly mammogram and aorta US

## 2014-04-24 NOTE — Telephone Encounter (Signed)
Pt is going to wait till her appt on 05/08/14. Will discuss it then

## 2014-05-01 ENCOUNTER — Emergency Department (HOSPITAL_BASED_OUTPATIENT_CLINIC_OR_DEPARTMENT_OTHER)
Admission: EM | Admit: 2014-05-01 | Discharge: 2014-05-01 | Disposition: A | Discharge status: Home / Self Care | Payer: Medicare HMO | Attending: Emergency Medicine | Admitting: Emergency Medicine

## 2014-05-01 ENCOUNTER — Encounter (HOSPITAL_BASED_OUTPATIENT_CLINIC_OR_DEPARTMENT_OTHER): Payer: Self-pay | Admitting: Emergency Medicine

## 2014-05-01 DIAGNOSIS — K219 Gastro-esophageal reflux disease without esophagitis: Secondary | ICD-10-CM | POA: Insufficient documentation

## 2014-05-01 DIAGNOSIS — R410 Disorientation, unspecified: Secondary | ICD-10-CM

## 2014-05-01 DIAGNOSIS — Z720 Tobacco use: Secondary | ICD-10-CM | POA: Diagnosis not present

## 2014-05-01 DIAGNOSIS — R4182 Altered mental status, unspecified: Secondary | ICD-10-CM | POA: Diagnosis present

## 2014-05-01 DIAGNOSIS — J439 Emphysema, unspecified: Secondary | ICD-10-CM | POA: Diagnosis not present

## 2014-05-01 DIAGNOSIS — E785 Hyperlipidemia, unspecified: Secondary | ICD-10-CM | POA: Diagnosis not present

## 2014-05-01 DIAGNOSIS — M1990 Unspecified osteoarthritis, unspecified site: Secondary | ICD-10-CM | POA: Insufficient documentation

## 2014-05-01 DIAGNOSIS — Z853 Personal history of malignant neoplasm of breast: Secondary | ICD-10-CM | POA: Insufficient documentation

## 2014-05-01 DIAGNOSIS — E119 Type 2 diabetes mellitus without complications: Secondary | ICD-10-CM | POA: Insufficient documentation

## 2014-05-01 LAB — CBC WITH DIFFERENTIAL/PLATELET
BASOS PCT: 1 % (ref 0–1)
Basophils Absolute: 0 10*3/uL (ref 0.0–0.1)
EOS ABS: 0.1 10*3/uL (ref 0.0–0.7)
Eosinophils Relative: 3 % (ref 0–5)
HCT: 39.6 % (ref 36.0–46.0)
Hemoglobin: 13.4 g/dL (ref 12.0–15.0)
Lymphocytes Relative: 33 % (ref 12–46)
Lymphs Abs: 1.4 10*3/uL (ref 0.7–4.0)
MCH: 30.7 pg (ref 26.0–34.0)
MCHC: 33.8 g/dL (ref 30.0–36.0)
MCV: 90.8 fL (ref 78.0–100.0)
Monocytes Absolute: 0.6 10*3/uL (ref 0.1–1.0)
Monocytes Relative: 14 % — ABNORMAL HIGH (ref 3–12)
NEUTROS PCT: 50 % (ref 43–77)
Neutro Abs: 2 10*3/uL (ref 1.7–7.7)
Platelets: 111 10*3/uL — ABNORMAL LOW (ref 150–400)
RBC: 4.36 MIL/uL (ref 3.87–5.11)
RDW: 14.2 % (ref 11.5–15.5)
WBC: 4.1 10*3/uL (ref 4.0–10.5)

## 2014-05-01 LAB — URINALYSIS, ROUTINE W REFLEX MICROSCOPIC
BILIRUBIN URINE: NEGATIVE
Glucose, UA: NEGATIVE mg/dL
Hgb urine dipstick: NEGATIVE
Ketones, ur: NEGATIVE mg/dL
LEUKOCYTES UA: NEGATIVE
NITRITE: NEGATIVE
Protein, ur: NEGATIVE mg/dL
Specific Gravity, Urine: 1.005 (ref 1.005–1.030)
UROBILINOGEN UA: 1 mg/dL (ref 0.0–1.0)
pH: 6 (ref 5.0–8.0)

## 2014-05-01 LAB — COMPREHENSIVE METABOLIC PANEL
ALBUMIN: 3.3 g/dL — AB (ref 3.5–5.2)
ALK PHOS: 111 U/L (ref 39–117)
ALT: 20 U/L (ref 0–35)
AST: 32 U/L (ref 0–37)
Anion gap: 13 (ref 5–15)
BUN: 11 mg/dL (ref 6–23)
CO2: 29 mEq/L (ref 19–32)
Calcium: 9.8 mg/dL (ref 8.4–10.5)
Chloride: 98 mEq/L (ref 96–112)
Creatinine, Ser: 0.7 mg/dL (ref 0.50–1.10)
GFR calc Af Amer: >90 mL/min (ref >90)
GFR calc non Af Amer: 85 mL/min — ABNORMAL LOW (ref >90)
Glucose, Bld: 187 mg/dL — ABNORMAL HIGH (ref 70–99)
POTASSIUM: 3.5 meq/L — AB (ref 3.7–5.3)
Sodium: 140 mEq/L (ref 137–147)
TOTAL PROTEIN: 6.5 g/dL (ref 6.0–8.3)
Total Bilirubin: 1.2 mg/dL (ref 0.3–1.2)

## 2014-05-01 NOTE — Discharge Instructions (Signed)
Followup with your primary Dr. if not improving in the next week.   Confusion Confusion is the inability to think with your usual speed or clarity. Confusion may come on quickly or slowly over time. How quickly the confusion comes on depends on the cause. Confusion can be due to any number of causes. CAUSES   Concussion, head injury, or head trauma.  Seizures.  Stroke.  Fever.  Brain tumor.  Age related decreased brain function (dementia).  Heightened emotional states like rage or terror.  Mental illness in which the person loses the ability to determine what is real and what is not (hallucinations).  Infections such as a urinary tract infection (UTI).  Toxic effects from alcohol, drugs, or prescription medicines.  Dehydration and an imbalance of salts in the body (electrolytes).  Lack of sleep.  Low blood sugar (diabetes).  Low levels of oxygen from conditions such as chronic lung disorders.  Drug interactions or other medicine side effects.  Nutritional deficiencies, especially niacin, thiamine, vitamin C, or vitamin B.  Sudden drop in body temperature (hypothermia).  Change in routine, such as when traveling or hospitalized. SIGNS AND SYMPTOMS  People often describe their thinking as cloudy or unclear when they are confused. Confusion can also include feeling disoriented. That means you are unaware of where or who you are. You may also not know what the date or time is. If confused, you may also have difficulty paying attention, remembering, and making decisions. Some people also act aggressively when they are confused.  DIAGNOSIS  The medical evaluation of confusion may include:  Blood and urine tests.  X-rays.  Brain and nervous system tests.  Analyzing your brain waves (electroencephalogram or EEG).  Magnetic resonance imaging (MRI) of your head.  Computed tomography (CT) scan of your head.  Mental status tests in which your health care provider may  ask many questions. Some of these questions may seem silly or strange, but they are a very important test to help diagnose and treat confusion. TREATMENT  An admission to the hospital may not be needed, but a person with confusion should not be left alone. Stay with a family member or friend until the confusion clears. Avoid alcohol, pain relievers, or sedative drugs until you have fully recovered. Do not drive until directed by your health care provider. HOME CARE INSTRUCTIONS  What family and friends can do:  To find out if someone is confused, ask the person to state his or her name, age, and the date. If the person is unsure or answers incorrectly, he or she is confused.  Always introduce yourself, no matter how well the person knows you.  Often remind the person of his or her location.  Place a calendar and clock near the confused person.  Help the person with his or her medicines. You may want to use a pill box, an alarm as a reminder, or give the person each dose as prescribed.  Talk about current events and plans for the day.  Try to keep the environment calm, quiet, and peaceful.  Make sure the person keeps follow-up visits with his or her health care provider. PREVENTION  Ways to prevent confusion:  Avoid alcohol.  Eat a balanced diet.  Get enough sleep.  Take medicine only as directed by your health care provider.  Do not become isolated. Spend time with other people and make plans for your days.  Keep careful watch on your blood sugar levels if you are diabetic. SEEK IMMEDIATE  MEDICAL CARE IF:   You develop severe headaches, repeated vomiting, seizures, blackouts, or slurred speech.  There is increasing confusion, weakness, numbness, restlessness, or personality changes.  You develop a loss of balance, have marked dizziness, feel uncoordinated, or fall.  You have delusions, hallucinations, or develop severe anxiety.  Your family members think you need to be  rechecked. Document Released: 08/20/2004 Document Revised: 11/27/2013 Document Reviewed: 08/18/2013 [facility] Patient Information 2015 [place], [place]. This information is not intended to replace advice given to you by your health care provider. Make sure you discuss any questions you have with your health care provider.

## 2014-05-01 NOTE — ED Notes (Signed)
C/o confusion at night noticed since she has been on Singular about a month ago. States she is sleeping better since she went on Singular. Oriented x 3. Pupils equal and reactive. Equal strength x 4. States she has bad knee on rt and it is always harder to raise rt leg.

## 2014-05-01 NOTE — ED Provider Notes (Signed)
CSN: [id number]     Arrival date & time 05/01/14  1018 History   First MD Initiated Contact with Patient 05/01/14 1045     Chief Complaint  Patient presents with  . Altered Mental Status     (Consider location/radiation/quality/duration/timing/severity/associated sxs/prior Treatment) HPI Comments: Patient is a 73 year old female with past medical history of COPD, diabetes, and anxiety. She presents for evaluation of confusion that has been occurring at night. She states that she sits up on the edge of the bed believing that she is actually sitting on the toilet. She becomes confused and is unsure where she is. This has been happening more frequently. She denies any chest pain, headache, difficulty breathing, or other new symptoms. She reports taking medication for anxiety but denies taking any sleeping pills.  Patient is a 73 y.o. female presenting with altered mental status. The history is provided by the patient.  Altered Mental Status Presenting symptoms: confusion   Severity:  Moderate Timing:  Intermittent Progression:  Worsening Chronicity:  New   Past Medical History  Diagnosis Date  . Emphysema   . Diabetes mellitus type 2  . Hyperlipidemia   . Cancer breast ca  right  . Anxiety   . Panic attacks   . Depression   . Arthritis of both knees 10/01/2013  . Benign paroxysmal positional vertigo 10/01/2013  . Neck pain 10/01/2013  . Esophageal reflux 10/01/2013  . Pedal edema 12/10/2013  . Overactive bladder 12/10/2013  . Tobacco abuse disorder 02/01/2014  . COPD (chronic obstructive pulmonary disease) 10/01/2013   Past Surgical History  Procedure Laterality Date  . Gallbladder surgery  1992  . Breast surgery  2009 right  . Appendectomy  2007  . Knee surgery    . Mandible fracture surgery    . Pilonidal cyst excision    . Tonsillectomy     Family History  Problem Relation Age of Onset  . Heart failure Father   . COPD Father   . Arthritis Father 93  . Pneumonia Sister   .  Breast cancer    . Stroke Mother   . Arthritis Mother 29  . Hyperlipidemia Mother   . Hypertension Mother   . Diabetes Mother   . Breast cancer Maternal Aunt   . Alcohol abuse Maternal Uncle    History  Substance Use Topics  . Smoking status: Current Some Day Smoker -- 0.05 packs/day    Types: Cigarettes    Start date: 07/27/1964  . Smokeless tobacco: Never Used     Comment: still smoking  03-28-14  . Alcohol Use: No   OB History   Grav Para Term Preterm Abortions TAB SAB Ect Mult Living                 Review of Systems  Psychiatric/Behavioral: Positive for confusion.  All other systems reviewed and are negative.     Allergies  Citalopram and Erythromycin  Home Medications   Prior to Admission medications   Medication Sig Start Date End Date Taking? Authorizing Provider  albuterol (PROVENTIL HFA;VENTOLIN HFA) 108 (90 BASE) MCG/ACT inhaler Inhale 2 puffs into the lungs every 6 (six) hours as needed for wheezing or shortness of breath. Only dispense Ventolin 09/26/13  Yes Mosie Lukes, MD  albuterol (PROVENTIL) (2.5 MG/3ML) 0.083% nebulizer solution Inhale one vial via nebulizer four times daily as needed   Yes Elsie Stain, MD  Blood Glucose Monitoring Suppl (ONE TOUCH ULTRA SYSTEM KIT) W/DEVICE KIT 1 kit by Does not  apply route once.   Yes Historical Provider, MD  Cholecalciferol (VITAMIN D3) 2000 UNITS TABS Take by mouth daily.    Yes Historical Provider, MD  diazepam (VALIUM) 5 MG tablet Take 1 tablet (5 mg total) by mouth daily as needed for anxiety or muscle spasms. 1 daily as needed 12/08/13  Yes Mosie Lukes, MD  Fluticasone-Salmeterol (ADVAIR DISKUS) 250-50 MCG/DOSE AEPB INHALE ONE PUFF BY MOUTH TWICE DAILY 07/13/13  Yes Elsie Stain, MD  furosemide (LASIX) 20 MG tablet Take 1 tablet (20 mg total) by mouth daily. 03/30/14  Yes Brunetta Jeans, PA-C  lovastatin (MEVACOR) 20 MG tablet Take 1 tablet (20 mg total) by mouth at bedtime. 20 mg 1 by mouth daily  10/23/13  Yes Mosie Lukes, MD  metFORMIN (GLUCOPHAGE) 500 MG tablet Take 500 mg by mouth 2 (two) times daily with a meal. 12/11/13  Yes Mosie Lukes, MD  montelukast (SINGULAIR) 10 MG tablet Take 1 tablet (10 mg total) by mouth at bedtime. 02/28/14  Yes Brunetta Jeans, PA-C  Naproxen Sodium (ALEVE) 220 MG CAPS Take by mouth. As needed    Yes Historical Provider, MD  omeprazole (PRILOSEC OTC) 20 MG tablet Take 20 mg by mouth daily.     Yes Historical Provider, MD  ONE TOUCH ULTRA TEST test strip by Other route as needed.  11/12/13  Yes Historical Provider, MD  Respiratory Therapy Supplies (FLUTTER) DEVI Use 3-4 times daily as needed 12/14/13  Yes Elsie Stain, MD   BP 124/51  Pulse 88  Temp(Src) 98.2 F (36.8 C) (Oral)  Resp 24  Ht '5\' 6"'  (1.676 m)  Wt 155 lb (70.308 kg)  BMI 25.03 kg/m2  SpO2 91% Physical Exam  Nursing note and vitals reviewed. Constitutional: She is oriented to person, place, and time. She appears well-developed and well-nourished. No distress.  HENT:  Head: Normocephalic and atraumatic.  Neck: Normal range of motion. Neck supple.  Cardiovascular: Normal rate and regular rhythm.  Exam reveals no gallop and no friction rub.   No murmur heard. Pulmonary/Chest: Effort normal and breath sounds normal. No respiratory distress. She has no wheezes.  Abdominal: Soft. Bowel sounds are normal. She exhibits no distension. There is no tenderness.  Musculoskeletal: Normal range of motion.  Neurological: She is alert and oriented to person, place, and time.  Skin: Skin is warm and dry. She is not diaphoretic.    ED Course  Procedures (including critical care time) Labs Review Labs Reviewed  URINALYSIS, ROUTINE W REFLEX MICROSCOPIC  CBC WITH DIFFERENTIAL  COMPREHENSIVE METABOLIC PANEL    Imaging Review No results found.   EKG Interpretation   Date/Time:  Tuesday May 01 2014 10:40:26 EDT Ventricular Rate:  85 PR Interval:  130 QRS Duration: 94 QT Interval:   406 QTC Calculation: 483 R Axis:   67 Text Interpretation:  Normal sinus rhythm Right atrial enlargement  Nonspecific ST abnormality Abnormal ECG No change since 09/18/13 Confirmed  by DELOS  MD, Demontrez Rindfleisch ([id number]) on 05/01/2014 12:13:51 PM      MDM   Final diagnoses:  None    Patient presents with complaints of nighttime confusion that has been occurring for the past week. Her physical examination is unremarkable and neurologic exam is nonfocal. Her urinalysis and electrolytes are essentially unremarkable. I do not feel as though CT scan is indicated I believe she is appropriate for discharge. This issue appears to be one that is best worked up by her primary Dr. in an  outpatient setting. She has an appointment in one week and I will advise her to keep this. She understands to return if her symptoms worsen or change.    Veryl Speak, MD 05/01/14 [phone]

## 2014-05-08 ENCOUNTER — Ambulatory Visit (INDEPENDENT_AMBULATORY_CARE_PROVIDER_SITE_OTHER): Payer: Medicare HMO | Admitting: Family Medicine

## 2014-05-08 ENCOUNTER — Encounter: Payer: Self-pay | Admitting: Family Medicine

## 2014-05-08 VITALS — BP 112/69 | HR 100 | Temp 97.8°F | Ht 67.0 in | Wt 154.6 lb

## 2014-05-08 DIAGNOSIS — J449 Chronic obstructive pulmonary disease, unspecified: Secondary | ICD-10-CM

## 2014-05-08 DIAGNOSIS — I714 Abdominal aortic aneurysm, without rupture, unspecified: Secondary | ICD-10-CM

## 2014-05-08 DIAGNOSIS — K219 Gastro-esophageal reflux disease without esophagitis: Secondary | ICD-10-CM

## 2014-05-08 DIAGNOSIS — R609 Edema, unspecified: Secondary | ICD-10-CM

## 2014-05-08 DIAGNOSIS — M25561 Pain in right knee: Secondary | ICD-10-CM

## 2014-05-08 DIAGNOSIS — M170 Bilateral primary osteoarthritis of knee: Secondary | ICD-10-CM

## 2014-05-08 DIAGNOSIS — E1159 Type 2 diabetes mellitus with other circulatory complications: Secondary | ICD-10-CM

## 2014-05-08 DIAGNOSIS — F17210 Nicotine dependence, cigarettes, uncomplicated: Secondary | ICD-10-CM

## 2014-05-08 DIAGNOSIS — E785 Hyperlipidemia, unspecified: Secondary | ICD-10-CM

## 2014-05-08 DIAGNOSIS — M129 Arthropathy, unspecified: Secondary | ICD-10-CM

## 2014-05-08 DIAGNOSIS — Z720 Tobacco use: Secondary | ICD-10-CM

## 2014-05-08 DIAGNOSIS — Z23 Encounter for immunization: Secondary | ICD-10-CM

## 2014-05-08 DIAGNOSIS — R600 Localized edema: Secondary | ICD-10-CM

## 2014-05-08 DIAGNOSIS — E119 Type 2 diabetes mellitus without complications: Secondary | ICD-10-CM

## 2014-05-08 MED ORDER — FUROSEMIDE 20 MG PO TABS: 20.0000 mg | ORAL_TABLET | Freq: Every day | ORAL | Status: DC | PRN

## 2014-05-08 NOTE — Assessment & Plan Note (Addendum)
Right knee is worsening caused a fall a couple weeks ago. Will refer to orthopaedics for further consideration at this time. Has gotten some relief from Naproxen. Worsening right knee pain, swelling and weakness. Driving if difficult due to difficulty picking up the leg

## 2014-05-08 NOTE — Progress Notes (Signed)
Pre visit review using our clinic review tool, if applicable. No additional management support is needed unless otherwise documented below in the visit note. 

## 2014-05-08 NOTE — Assessment & Plan Note (Signed)
Encouraged complete cessation. Discussed need to quit as relates to risk of numerous cancers, cardiac and pulmonary disease as well as neurologic complications. Counseled for greater than 3 minutes 

## 2014-05-08 NOTE — Assessment & Plan Note (Signed)
hgba1c acceptable, minimize simple carbs. Increase exercise as tolerated. Continue current meds 

## 2014-05-08 NOTE — Assessment & Plan Note (Signed)
Tolerating statin, encouraged heart healthy diet, avoid trans fats, minimize simple carbs and saturated fats. Increase exercise as tolerated 

## 2014-05-08 NOTE — Patient Instructions (Signed)
Abdominal Aortic Aneurysm An aneurysm is a weakened or damaged part of an artery wall that bulges from the normal force of blood pumping through the body. An abdominal aortic aneurysm is an aneurysm that occurs in the lower part of the aorta, the main artery of the body.  The major concern with an abdominal aortic aneurysm is that it can enlarge and burst (rupture) or blood can flow between the layers of the wall of the aorta through a tear (aorticdissection). Both of these conditions can cause bleeding inside the body and can be life threatening unless diagnosed and treated promptly. CAUSES  The exact cause of an abdominal aortic aneurysm is unknown. Some contributing factors are:   A hardening of the arteries caused by the buildup of fat and other substances in the lining of a blood vessel (arteriosclerosis).  Inflammation of the walls of an artery (arteritis).   Connective tissue diseases, such as Marfan syndrome.   Abdominal trauma.   An infection, such as syphilis or staphylococcus, in the wall of the aorta (infectious aortitis) caused by bacteria. RISK FACTORS  Risk factors that contribute to an abdominal aortic aneurysm may include:  Age older than 60 years.   High blood pressure (hypertension).  Female gender.  Ethnicity (white race).  Obesity.  Family history of aneurysm (first degree relatives only).  Tobacco use. PREVENTION  The following healthy lifestyle habits may help decrease your risk of abdominal aortic aneurysm:  Quitting smoking. Smoking can raise your blood pressure and cause arteriosclerosis.  Limiting or avoiding alcohol.  Keeping your blood pressure, blood sugar level, and cholesterol levels within normal limits.  Decreasing your salt intake. In somepeople, too much salt can raise blood pressure and increase your risk of abdominal aortic aneurysm.  Eating a diet low in saturated fats and cholesterol.  Increasing your fiber intake by including  whole grains, vegetables, and fruits in your diet. Eating these foods may help lower blood pressure.  Maintaining a healthy weight.  Staying physically active and exercising regularly. SYMPTOMS  The symptoms of abdominal aortic aneurysm may vary depending on the size and rate of growth of the aneurysm.Most grow slowly and do not have any symptoms. When symptoms do occur, they may include:  Pain (abdomen, side, lower back, or groin). The pain may vary in intensity. A sudden onset of severe pain may indicate that the aneurysm has ruptured.  Feeling full after eating only small amounts of food.  Nausea or vomiting or both.  Feeling a pulsating lump in the abdomen.  Feeling faint or passing out. DIAGNOSIS  Since most unruptured abdominal aortic aneurysms have no symptoms, they are often discovered during diagnostic exams for other conditions. An aneurysm may be found during the following procedures:  Ultrasonography (A one-time screening for abdominal aortic aneurysm by ultrasonography is also recommended for all men aged 65-75 years who have ever smoked).  X-ray exams.  A computed tomography (CT).  Magnetic resonance imaging (MRI).  Angiography or arteriography. TREATMENT  Treatment of an abdominal aortic aneurysm depends on the size of your aneurysm, your age, and risk factors for rupture. Medication to control blood pressure and pain may be used to manage aneurysms smaller than 6 cm. Regular monitoring for enlargement may be recommended by your caregiver if:  The aneurysm is 3-4 cm in size (an annual ultrasonography may be recommended).  The aneurysm is 4-4.5 cm in size (an ultrasonography every 6 months may be recommended).  The aneurysm is larger than 4.5 cm in   size (your caregiver may ask that you be examined by a vascular surgeon). If your aneurysm is larger than 6 cm, surgical repair may be recommended. There are two main methods for repair of an aneurysm:   Endovascular  repair (a minimally invasive surgery). This is done most often.  Open repair. This method is used if an endovascular repair is not possible. Document Released: 04/22/2005 Document Revised: 11/07/2012 Document Reviewed: 08/12/2012 [employer] Patient Information 2015 [employer]. This information is not intended to replace advice given to you by your health care provider. Make sure you discuss any questions you have with your health care provider.  

## 2014-05-09 ENCOUNTER — Telehealth: Payer: Self-pay | Admitting: Family Medicine

## 2014-05-09 NOTE — Telephone Encounter (Signed)
[facility] MAILED  °

## 2014-05-10 ENCOUNTER — Ambulatory Visit (HOSPITAL_BASED_OUTPATIENT_CLINIC_OR_DEPARTMENT_OTHER): Payer: Medicare HMO

## 2014-05-10 ENCOUNTER — Emergency Department (HOSPITAL_BASED_OUTPATIENT_CLINIC_OR_DEPARTMENT_OTHER): Payer: Medicare HMO

## 2014-05-10 ENCOUNTER — Emergency Department (HOSPITAL_BASED_OUTPATIENT_CLINIC_OR_DEPARTMENT_OTHER)
Admission: EM | Admit: 2014-05-10 | Discharge: 2014-05-10 | Disposition: A | Discharge status: Home / Self Care | Payer: Medicare HMO | Attending: Emergency Medicine | Admitting: Emergency Medicine

## 2014-05-10 ENCOUNTER — Encounter (HOSPITAL_BASED_OUTPATIENT_CLINIC_OR_DEPARTMENT_OTHER): Payer: Self-pay | Admitting: Emergency Medicine

## 2014-05-10 ENCOUNTER — Telehealth: Payer: Self-pay

## 2014-05-10 DIAGNOSIS — R410 Disorientation, unspecified: Secondary | ICD-10-CM | POA: Diagnosis not present

## 2014-05-10 DIAGNOSIS — Z720 Tobacco use: Secondary | ICD-10-CM | POA: Diagnosis not present

## 2014-05-10 DIAGNOSIS — Z7951 Long term (current) use of inhaled steroids: Secondary | ICD-10-CM | POA: Insufficient documentation

## 2014-05-10 DIAGNOSIS — M170 Bilateral primary osteoarthritis of knee: Secondary | ICD-10-CM | POA: Insufficient documentation

## 2014-05-10 DIAGNOSIS — E785 Hyperlipidemia, unspecified: Secondary | ICD-10-CM | POA: Insufficient documentation

## 2014-05-10 DIAGNOSIS — N3000 Acute cystitis without hematuria: Secondary | ICD-10-CM | POA: Diagnosis not present

## 2014-05-10 DIAGNOSIS — Z8669 Personal history of other diseases of the nervous system and sense organs: Secondary | ICD-10-CM | POA: Insufficient documentation

## 2014-05-10 DIAGNOSIS — R4182 Altered mental status, unspecified: Secondary | ICD-10-CM | POA: Diagnosis present

## 2014-05-10 DIAGNOSIS — E119 Type 2 diabetes mellitus without complications: Secondary | ICD-10-CM | POA: Insufficient documentation

## 2014-05-10 DIAGNOSIS — Z859 Personal history of malignant neoplasm, unspecified: Secondary | ICD-10-CM | POA: Diagnosis not present

## 2014-05-10 DIAGNOSIS — K219 Gastro-esophageal reflux disease without esophagitis: Secondary | ICD-10-CM | POA: Diagnosis not present

## 2014-05-10 DIAGNOSIS — J449 Chronic obstructive pulmonary disease, unspecified: Secondary | ICD-10-CM | POA: Insufficient documentation

## 2014-05-10 DIAGNOSIS — Z79899 Other long term (current) drug therapy: Secondary | ICD-10-CM | POA: Insufficient documentation

## 2014-05-10 LAB — CBC
HEMATOCRIT: 39.7 % (ref 36.0–46.0)
HEMOGLOBIN: 13.5 g/dL (ref 12.0–15.0)
MCH: 31.1 pg (ref 26.0–34.0)
MCHC: 34 g/dL (ref 30.0–36.0)
MCV: 91.5 fL (ref 78.0–100.0)
Platelets: 122 10*3/uL — ABNORMAL LOW (ref 150–400)
RBC: 4.34 MIL/uL (ref 3.87–5.11)
RDW: 14 % (ref 11.5–15.5)
WBC: 3.8 10*3/uL — AB (ref 4.0–10.5)

## 2014-05-10 LAB — URINALYSIS, ROUTINE W REFLEX MICROSCOPIC
Bilirubin Urine: NEGATIVE
Glucose, UA: NEGATIVE mg/dL
HGB URINE DIPSTICK: NEGATIVE
Ketones, ur: NEGATIVE mg/dL
Nitrite: NEGATIVE
Protein, ur: NEGATIVE mg/dL
Specific Gravity, Urine: 1.018 (ref 1.005–1.030)
Urobilinogen, UA: 1 mg/dL (ref 0.0–1.0)
pH: 7 (ref 5.0–8.0)

## 2014-05-10 LAB — CBG MONITORING, ED: GLUCOSE-CAPILLARY: 123 mg/dL — AB (ref 70–99)

## 2014-05-10 LAB — BASIC METABOLIC PANEL
Anion gap: 12 (ref 5–15)
BUN: 12 mg/dL (ref 6–23)
CO2: 28 mEq/L (ref 19–32)
Calcium: 9.7 mg/dL (ref 8.4–10.5)
Chloride: 102 mEq/L (ref 96–112)
Creatinine, Ser: 0.6 mg/dL (ref 0.50–1.10)
GFR calc non Af Amer: 90 mL/min — ABNORMAL LOW (ref >90)
GLUCOSE: 138 mg/dL — AB (ref 70–99)
POTASSIUM: 4.1 meq/L (ref 3.7–5.3)
Sodium: 142 mEq/L (ref 137–147)

## 2014-05-10 LAB — TROPONIN I

## 2014-05-10 LAB — URINE MICROSCOPIC-ADD ON

## 2014-05-10 MED ORDER — CEPHALEXIN 500 MG PO CAPS: 500.0000 mg | ORAL_CAPSULE | Freq: Three times a day (TID) | ORAL | Status: DC

## 2014-05-10 NOTE — ED Provider Notes (Signed)
CSN: [id number]     Arrival date & time 05/10/14  1048 History   First MD Initiated Contact with Patient 05/10/14 1117     Chief Complaint  Patient presents with  . Altered Mental Status     (Consider location/radiation/quality/duration/timing/severity/associated sxs/prior Treatment) Patient is a 73 y.o. female presenting with altered mental status. The history is provided by the patient.  Altered Mental Status Presenting symptoms: confusion   Severity:  Mild Most recent episode:  More than 2 days ago Episode history:  Multiple Timing:  Intermittent Progression:  Unchanged Chronicity:  New Context: recent change in medication (began taking symbicort, but had it stopped as it was making her too sleepy. Now she reports barely sleeping)   Context: not dementia, taking medications as prescribed, not a nursing home resident and not a recent illness   Associated symptoms: no abdominal pain, no bladder incontinence, no depression, no difficulty breathing, no fever, no hallucinations, no headaches, no nausea, no palpitations, no rash, no slurred speech, no suicidal behavior, no visual change, no vomiting and no weakness     Past Medical History  Diagnosis Date  . Emphysema   . Diabetes mellitus type 2  . Hyperlipidemia   . Cancer breast ca  right  . Anxiety   . Panic attacks   . Depression   . Arthritis of both knees 10/01/2013  . Benign paroxysmal positional vertigo 10/01/2013  . Neck pain 10/01/2013  . Esophageal reflux 10/01/2013  . Pedal edema 12/10/2013  . Overactive bladder 12/10/2013  . Tobacco abuse disorder 02/01/2014  . COPD (chronic obstructive pulmonary disease) 10/01/2013   Past Surgical History  Procedure Laterality Date  . Gallbladder surgery  1992  . Breast surgery  2009 right  . Appendectomy  2007  . Knee surgery    . Mandible fracture surgery    . Pilonidal cyst excision    . Tonsillectomy     Family History  Problem Relation Age of Onset  . Heart failure Father    . COPD Father   . Arthritis Father 54  . Pneumonia Sister   . Breast cancer    . Stroke Mother   . Arthritis Mother 26  . Hyperlipidemia Mother   . Hypertension Mother   . Diabetes Mother   . Breast cancer Maternal Aunt   . Alcohol abuse Maternal Uncle    History  Substance Use Topics  . Smoking status: Current Some Day Smoker -- 0.05 packs/day    Types: Cigarettes    Start date: 07/27/1964  . Smokeless tobacco: Never Used     Comment: still smoking  03-28-14  . Alcohol Use: No   OB History   Grav Para Term Preterm Abortions TAB SAB Ect Mult Living                 Review of Systems  Constitutional: Negative for fever and chills.  Respiratory: Negative for cough and shortness of breath.   Cardiovascular: Negative for palpitations.  Gastrointestinal: Negative for nausea, vomiting and abdominal pain.  Genitourinary: Negative for bladder incontinence.  Skin: Negative for rash.  Neurological: Negative for weakness and headaches.  Psychiatric/Behavioral: Positive for confusion. Negative for hallucinations.  All other systems reviewed and are negative.     Allergies  Citalopram and Erythromycin  Home Medications   Prior to Admission medications   Medication Sig Start Date End Date Taking? Authorizing Provider  albuterol (PROVENTIL HFA;VENTOLIN HFA) 108 (90 BASE) MCG/ACT inhaler Inhale 2 puffs into the lungs every 6 (  six) hours as needed for wheezing or shortness of breath. Only dispense Ventolin 09/26/13   Mosie Lukes, MD  albuterol (PROVENTIL) (2.5 MG/3ML) 0.083% nebulizer solution Inhale one vial via nebulizer four times daily as needed    Elsie Stain, MD  Blood Glucose Monitoring Suppl (ONE TOUCH ULTRA SYSTEM KIT) W/DEVICE KIT 1 kit by Does not apply route once.    Historical Provider, MD  Cholecalciferol (VITAMIN D3) 2000 UNITS TABS Take by mouth daily.     Historical Provider, MD  diazepam (VALIUM) 5 MG tablet Take 1 tablet (5 mg total) by mouth daily as needed  for anxiety or muscle spasms. 1 daily as needed 12/08/13   Mosie Lukes, MD  Fluticasone-Salmeterol (ADVAIR DISKUS) 250-50 MCG/DOSE AEPB INHALE ONE PUFF BY MOUTH TWICE DAILY 07/13/13   Elsie Stain, MD  furosemide (LASIX) 20 MG tablet Take 1 tablet (20 mg total) by mouth daily as needed for fluid or edema (weight gain>3# in 24 hours). 05/08/14   Mosie Lukes, MD  lovastatin (MEVACOR) 20 MG tablet Take 1 tablet (20 mg total) by mouth at bedtime. 20 mg 1 by mouth daily 10/23/13   Mosie Lukes, MD  metFORMIN (GLUCOPHAGE) 500 MG tablet Take 500 mg by mouth 2 (two) times daily with a meal. 12/11/13   Mosie Lukes, MD  omeprazole (PRILOSEC OTC) 20 MG tablet Take 20 mg by mouth daily.      Historical Provider, MD  ONE TOUCH ULTRA TEST test strip by Other route as needed.  11/12/13   Historical Provider, MD  Respiratory Therapy Supplies (FLUTTER) DEVI Use 3-4 times daily as needed 12/14/13   Elsie Stain, MD   BP 132/58  Pulse 88  Temp(Src) 97.8 F (36.6 C) (Oral)  Resp 20  SpO2 93% Physical Exam  Nursing note and vitals reviewed. Constitutional: She is oriented to person, place, and time. She appears well-developed and well-nourished. No distress.  HENT:  Head: Normocephalic and atraumatic.  Mouth/Throat: Oropharynx is clear and moist. No oropharyngeal exudate.  Eyes: EOM are normal. Pupils are equal, round, and reactive to light.  Neck: Normal range of motion. Neck supple.  Cardiovascular: Normal rate and regular rhythm.  Exam reveals no friction rub.   No murmur heard. Pulmonary/Chest: Effort normal and breath sounds normal. No respiratory distress. She has no wheezes. She has no rales.  Abdominal: Soft. She exhibits no distension. There is no tenderness. There is no rebound.  Musculoskeletal: Normal range of motion. She exhibits no edema.  Neurological: She is alert and oriented to person, place, and time. No cranial nerve deficit. She exhibits normal muscle tone. Coordination  normal.  Skin: No rash noted. She is not diaphoretic.    ED Course  Procedures (including critical care time) Labs Review Labs Reviewed  CBC - Abnormal; Notable for the following:    WBC 3.8 (*)    Platelets 122 (*)    All other components within normal limits  BASIC METABOLIC PANEL - Abnormal; Notable for the following:    Glucose, Bld 138 (*)    GFR calc non Af Amer 90 (*)    All other components within normal limits  CBG MONITORING, ED - Abnormal; Notable for the following:    Glucose-Capillary 123 (*)    All other components within normal limits  TROPONIN I  URINALYSIS, ROUTINE W REFLEX MICROSCOPIC    Imaging Review Dg Chest 2 View  05/10/2014   CLINICAL DATA:  Altered mental status, history  of COPD  EXAM: CHEST  2 VIEW  COMPARISON:  02/28/2014  FINDINGS: The lungs are hyperinflated likely secondary to COPD. There is no focal parenchymal opacity, pleural effusion, or pneumothorax. The heart and mediastinal contours are unremarkable.  The osseous structures are unremarkable.  IMPRESSION: No active cardiopulmonary disease.   Electronically Signed   By: Kathreen Devoid   On: 05/10/2014 12:19   Ct Head Wo Contrast  05/10/2014   CLINICAL DATA:  Altered mental status for 2 weeks.  EXAM: CT HEAD WITHOUT CONTRAST  TECHNIQUE: Contiguous axial images were obtained from the base of the skull through the vertex without intravenous contrast.  COMPARISON:  04/16/2014  FINDINGS: No mass lesion. No midline shift. No acute hemorrhage or hematoma. No extra-axial fluid collections. No evidence of acute infarction. 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 Brain parenchyma is otherwise normal. No acute osseous abnormality. 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  IMPRESSION: No acute abnormality.   Electronically Signed   By: Rozetta Nunnery M.D.   On: 05/10/2014 12:16     EKG  Interpretation   Date/Time:  Thursday May 10 2014 11:30:29 EDT Ventricular Rate:  82 PR Interval:  134 QRS Duration: 92 QT Interval:  416 QTC Calculation: 486 R Axis:   63 Text Interpretation:  Normal sinus rhythm Normal ECG Simlar to prior  Confirmed by Mingo Amber  MD, [place] ([id number]) on 05/10/2014 2:38:32 PM      MDM   Final diagnoses:  Acute cystitis without hematuria  Confusion    24F presents with confusion. Seen here one week ago for same, blood work ok at that time. Thought it all stemmed from beginning symbicort, as it caused her to have increased sleepiness. She spoke with her PCP and had symbicort discontinued. She reports insonmia for past few days, worse last night with only 30 minutes spurts of sleeping. She got confused and sat on the edge of her bed and thought it was the toilet. She also had trouble putting her false teeth in. She is here, alert, oriented. She was unable to get in to see her PCP. Nonfocal neuro exam. I suspect this could be due to not sleeping, however will do workup to look for further causes. Urine with mild infection. Will treat with keflex. Patient doing well, relaxing comfortably. Stable for discharge.  Evelina Bucy, MD 05/10/14 [phone]

## 2014-05-10 NOTE — ED Notes (Signed)
Patient states she has had problems with confusion for over one week.  Was seen here for the same.  States she did follow up with Dr. Charlett Blake and had some medication changes because she was sleeping all the time.  Since the beginning of the confusion problems, she has had some problems with incontinence.  States her confusion has worsened since last time she was here, states yesterday was worse than today.  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  Patient is able to verbalized an awareness of her confusion and feels that it is getting worse.  Hx of anxiety and states she feels anxious about having this ultrasound today.

## 2014-05-10 NOTE — ED Notes (Signed)
Assisted pt with dressing and to restroom per rn request

## 2014-05-10 NOTE — Discharge Instructions (Signed)
Urinary Tract Infection Urinary tract infections (UTIs) can develop anywhere along your urinary tract. Your urinary tract is your body's drainage system for removing wastes and extra water. Your urinary tract includes two kidneys, two ureters, a bladder, and a urethra. Your kidneys are a pair of bean-shaped organs. Each kidney is about the size of your fist. They are located below your ribs, one on each side of your spine. CAUSES Infections are caused by microbes, which are microscopic organisms, including fungi, viruses, and bacteria. These organisms are so small that they can only be seen through a microscope. Bacteria are the microbes that most commonly cause UTIs. SYMPTOMS  Symptoms of UTIs may vary by age and gender of the patient and by the location of the infection. Symptoms in young women typically include a frequent and intense urge to urinate and a painful, burning feeling in the bladder or urethra during urination. Older women and men are more likely to be tired, shaky, and weak and have muscle aches and abdominal pain. A fever may mean the infection is in your kidneys. Other symptoms of a kidney infection include pain in your back or sides below the ribs, nausea, and vomiting. DIAGNOSIS To diagnose a UTI, your caregiver will ask you about your symptoms. Your caregiver also will ask to provide a urine sample. The urine sample will be tested for bacteria and white blood cells. White blood cells are made by your body to help fight infection. TREATMENT  Typically, UTIs can be treated with medication. Because most UTIs are caused by a bacterial infection, they usually can be treated with the use of antibiotics. The choice of antibiotic and length of treatment depend on your symptoms and the type of bacteria causing your infection. HOME CARE INSTRUCTIONS  If you were prescribed antibiotics, take them exactly as your caregiver instructs you. Finish the medication even if you feel better after you  have only taken some of the medication.  Drink enough water and fluids to keep your urine clear or pale yellow.  Avoid caffeine, tea, and carbonated beverages. They tend to irritate your bladder.  Empty your bladder often. Avoid holding urine for long periods of time.  Empty your bladder before and after sexual intercourse.  After a bowel movement, women should cleanse from front to back. Use each tissue only once. SEEK MEDICAL CARE IF:   You have back pain.  You develop a fever.  Your symptoms do not begin to resolve within 3 days. SEEK IMMEDIATE MEDICAL CARE IF:   You have severe back pain or lower abdominal pain.  You develop chills.  You have nausea or vomiting.  You have continued burning or discomfort with urination. MAKE SURE YOU:   Understand these instructions.  Will watch your condition.  Will get help right away if you are not doing well or get worse. Document Released: 04/22/2005 Document Revised: 01/12/2012 Document Reviewed: 08/21/2011 [facility] Patient Information 2015 [place], [place]. This information is not intended to replace advice given to you by your health care provider. Make sure you discuss any questions you have with your health care provider.  Confusion Confusion is the inability to think with your usual speed or clarity. Confusion may come on quickly or slowly over time. How quickly the confusion comes on depends on the cause. Confusion can be due to any number of causes. CAUSES   Concussion, head injury, or head trauma.  Seizures.  Stroke.  Fever.  Brain tumor.  Age related decreased brain function (dementia).  Heightened emotional states like rage or terror.  Mental illness in which the person loses the ability to determine what is real and what is not (hallucinations).  Infections such as a urinary tract infection (UTI).  Toxic effects from alcohol, drugs, or prescription medicines.  Dehydration and an imbalance of salts in the  body (electrolytes).  Lack of sleep.  Low blood sugar (diabetes).  Low levels of oxygen from conditions such as chronic lung disorders.  Drug interactions or other medicine side effects.  Nutritional deficiencies, especially niacin, thiamine, vitamin C, or vitamin B.  Sudden drop in body temperature (hypothermia).  Change in routine, such as when traveling or hospitalized. SIGNS AND SYMPTOMS  People often describe their thinking as cloudy or unclear when they are confused. Confusion can also include feeling disoriented. That means you are unaware of where or who you are. You may also not know what the date or time is. If confused, you may also have difficulty paying attention, remembering, and making decisions. Some people also act aggressively when they are confused.  DIAGNOSIS  The medical evaluation of confusion may include:  Blood and urine tests.  X-rays.  Brain and nervous system tests.  Analyzing your brain waves (electroencephalogram or EEG).  Magnetic resonance imaging (MRI) of your head.  Computed tomography (CT) scan of your head.  Mental status tests in which your health care provider may ask many questions. Some of these questions may seem silly or strange, but they are a very important test to help diagnose and treat confusion. TREATMENT  An admission to the hospital may not be needed, but a person with confusion should not be left alone. Stay with a family member or friend until the confusion clears. Avoid alcohol, pain relievers, or sedative drugs until you have fully recovered. Do not drive until directed by your health care provider. HOME CARE INSTRUCTIONS  What family and friends can do:  To find out if someone is confused, ask the person to state his or her name, age, and the date. If the person is unsure or answers incorrectly, he or she is confused.  Always introduce yourself, no matter how well the person knows you.  Often remind the person of his or  her location.  Place a calendar and clock near the confused person.  Help the person with his or her medicines. You may want to use a pill box, an alarm as a reminder, or give the person each dose as prescribed.  Talk about current events and plans for the day.  Try to keep the environment calm, quiet, and peaceful.  Make sure the person keeps follow-up visits with his or her health care provider. PREVENTION  Ways to prevent confusion:  Avoid alcohol.  Eat a balanced diet.  Get enough sleep.  Take medicine only as directed by your health care provider.  Do not become isolated. Spend time with other people and make plans for your days.  Keep careful watch on your blood sugar levels if you are diabetic. SEEK IMMEDIATE MEDICAL CARE IF:   You develop severe headaches, repeated vomiting, seizures, blackouts, or slurred speech.  There is increasing confusion, weakness, numbness, restlessness, or personality changes.  You develop a loss of balance, have marked dizziness, feel uncoordinated, or fall.  You have delusions, hallucinations, or develop severe anxiety.  Your family members think you need to be rechecked. Document Released: 08/20/2004 Document Revised: 11/27/2013 Document Reviewed: 08/18/2013 [facility] Patient Information 2015 [place], [place]. This information is not intended  to replace advice given to you by your health care provider. Make sure you discuss any questions you have with your health care provider. ° °

## 2014-05-10 NOTE — ED Notes (Signed)
Dc Iv per rn request.from left arm.

## 2014-05-10 NOTE — Telephone Encounter (Signed)
Pt walked into clinic with c/o confusion.  States this morning she was disoriented---she could not remember how to check her blood sugar and was putting her dentures in upside down and therefore she called 911.  Ambulance came and assessed her and advised her to make an appointment with her PCP.  Pt informed schedulers upon her arrival that she drove here by herself, but was not sure how she got here.  Upon assessment she appeared to be in a dazed but was able to answer questions appropriately (Oriented x 3).  O2 sat 96% on room air, HR: 86.  Given that there were no appointments in our office today, pt was advised to go downstairs to [place] [facility].  She was escorted downstairs via wheelchair.

## 2014-05-13 NOTE — Assessment & Plan Note (Signed)
Encouraged to minimize sodium elevate legs above heart. Try compression hose. May use Lasix prn.

## 2014-05-13 NOTE — Progress Notes (Signed)
[name], female   DOB: 10/11/1940, 73 y.o.   MRN: [id number] [facility] [id number] 1941-06-08 05/13/2014      Progress Note-Follow Up  Subjective  Chief Complaint  Chief Complaint  Patient presents with  . Follow-up  . Injections    tdap    HPI  Patient is a 73 year old female in today for routine medical care. In today for followup. Fell 3 weeks ago when her knees buckled and her pain is improving and she is following with orthopedics. She tried Singulair for her allergies and it caused sedation. He is to has been struggling with increased edema bilaterally. Denies CP/palp/SOB/HA/congestion/fevers/GI or GU c/o. Taking meds as prescribed  Past Medical History  Diagnosis Date  . Emphysema   . Diabetes mellitus type 2  . Hyperlipidemia   . Cancer breast ca  right  . Anxiety   . Panic attacks   . Depression   . Arthritis of both knees 10/01/2013  . Benign paroxysmal positional vertigo 10/01/2013  . Neck pain 10/01/2013  . Esophageal reflux 10/01/2013  . Pedal edema 12/10/2013  . Overactive bladder 12/10/2013  . Tobacco abuse disorder 02/01/2014  . COPD (chronic obstructive pulmonary disease) 10/01/2013    Past Surgical History  Procedure Laterality Date  . Gallbladder surgery  1992  . Breast surgery  2009 right  . Appendectomy  2007  . Knee surgery    . Mandible fracture surgery    . Pilonidal cyst excision    . Tonsillectomy      Family History  Problem Relation Age of Onset  . Heart failure Father   . COPD Father   . Arthritis Father 81  . Pneumonia Sister   . Breast cancer    . Stroke Mother   . Arthritis Mother 69  . Hyperlipidemia Mother   . Hypertension Mother   . Diabetes Mother   . Breast cancer Maternal Aunt   . Alcohol abuse Maternal Uncle     History   Social History  . Marital Status: Single    Spouse Name: N/A    Number of Children: 0  . Years of Education: N/A   Occupational History  . retire    Social History Main Topics   . Smoking status: Current Some Day Smoker -- 0.05 packs/day    Types: Cigarettes    Start date: 07/27/1964  . Smokeless tobacco: Never Used     Comment: still smoking  03-28-14  . Alcohol Use: No  . Drug Use: No  . Sexual Activity: Yes    Birth Control/ Protection: Post-menopausal   Other Topics Concern  . Not on file   Social History Narrative  . No narrative on file    Current Outpatient Prescriptions on File Prior to Visit  Medication Sig Dispense Refill  . albuterol (PROVENTIL HFA;VENTOLIN HFA) 108 (90 BASE) MCG/ACT inhaler Inhale 2 puffs into the lungs every 6 (six) hours as needed for wheezing or shortness of breath. Only dispense Ventolin  1 Inhaler  3  . albuterol (PROVENTIL) (2.5 MG/3ML) 0.083% nebulizer solution Inhale one vial via nebulizer four times daily as needed  375 mL  4  . Blood Glucose Monitoring Suppl (ONE TOUCH ULTRA SYSTEM KIT) W/DEVICE KIT 1 kit by Does not apply route once.      . Cholecalciferol (VITAMIN D3) 2000 UNITS TABS Take by mouth daily.       . diazepam (VALIUM) 5 MG tablet Take 1 tablet (5 mg total)  by mouth daily as needed for anxiety or muscle spasms. 1 daily as needed  30 tablet  3  . Fluticasone-Salmeterol (ADVAIR DISKUS) 250-50 MCG/DOSE AEPB INHALE ONE PUFF BY MOUTH TWICE DAILY  14 each  0  . lovastatin (MEVACOR) 20 MG tablet Take 1 tablet (20 mg total) by mouth at bedtime. 20 mg 1 by mouth daily  90 tablet  1  . metFORMIN (GLUCOPHAGE) 500 MG tablet Take 500 mg by mouth 2 (two) times daily with a meal.      . omeprazole (PRILOSEC OTC) 20 MG tablet Take 20 mg by mouth daily.        . ONE TOUCH ULTRA TEST test strip by Other route as needed.       Marland Kitchen Respiratory Therapy Supplies (FLUTTER) DEVI Use 3-4 times daily as needed       No current facility-administered medications on file prior to visit.    Allergies  Allergen Reactions  . Citalopram     Confusion, irregular heart beat.  . Erythromycin     Stomach cramps    Review of  Systems  Review of Systems  Constitutional: Negative for fever and malaise/fatigue.  HENT: Positive for congestion.   Eyes: Negative for discharge.  Respiratory: Negative for shortness of breath.   Cardiovascular: Negative for chest pain, palpitations and leg swelling.  Gastrointestinal: Negative for nausea, abdominal pain and diarrhea.  Genitourinary: Negative for dysuria.  Musculoskeletal: Positive for joint pain.  Skin: Negative for rash.  Neurological: Negative for loss of consciousness and headaches.  Endo/Heme/Allergies: Negative for polydipsia.  Psychiatric/Behavioral: Negative for depression and suicidal ideas. The patient is not nervous/anxious and does not have insomnia.     Objective  BP 112/69  Pulse 100  Temp(Src) 97.8 F (36.6 C) (Oral)  Ht '5\' 7"'  (1.702 m)  Wt 154 lb 9.6 oz (70.126 kg)  BMI 24.21 kg/m2  SpO2 98%  Physical Exam  Physical Exam  Constitutional: She is oriented to person, place, and time and well-developed, well-nourished, and in no distress. No distress.  HENT:  Head: Normocephalic and atraumatic.  Eyes: Conjunctivae are normal.  Neck: Neck supple. No thyromegaly present.  Cardiovascular: Normal rate, regular rhythm, normal heart sounds and intact distal pulses.   No murmur heard. Pulmonary/Chest: Effort normal and breath sounds normal. She has no wheezes.  Abdominal: She exhibits no distension and no mass.  Musculoskeletal: She exhibits no edema.  Lymphadenopathy:    She has no cervical adenopathy.  Neurological: She is alert and oriented to person, place, and time.  Skin: Skin is warm and dry. No rash noted. She is not diaphoretic.  Psychiatric: Memory, affect and judgment normal.    Lab Results  Component Value Date   TSH 1.158 12/08/2013   Lab Results  Component Value Date   WBC 3.8* 05/10/2014   HGB 13.5 05/10/2014   HCT 39.7 05/10/2014   MCV 91.5 05/10/2014   PLT 122* 05/10/2014   Lab Results  Component Value Date    CREATININE 0.60 05/10/2014   BUN 12 05/10/2014   NA 142 05/10/2014   K 4.1 05/10/2014   CL 102 05/10/2014   CO2 28 05/10/2014   Lab Results  Component Value Date   ALT 20 05/01/2014   AST 32 05/01/2014   ALKPHOS 111 05/01/2014   BILITOT 1.2 05/01/2014   Lab Results  Component Value Date   CHOL 167 04/12/2014   Lab Results  Component Value Date   HDL 52.30 04/12/2014   Lab  Results  Component Value Date   LDLCALC 98 04/12/2014   Lab Results  Component Value Date   TRIG 83.0 04/12/2014   Lab Results  Component Value Date   CHOLHDL 3 04/12/2014     Assessment & Plan  Diabetes mellitus hgba1c acceptable, minimize simple carbs. Increase exercise as tolerated. Continue current meds  Hyperlipidemia Tolerating statin, encouraged heart healthy diet, avoid trans fats, minimize simple carbs and saturated fats. Increase exercise as tolerated  Arthritis of both knees Right knee is worsening caused a fall a couple weeks ago. Will refer to orthopaedics for further consideration at this time. Has gotten some relief from Naproxen. Worsening right knee pain, swelling and weakness. Driving if difficult due to difficulty picking up the leg  Tobacco abuse disorder Encouraged complete cessation. Discussed need to quit as relates to risk of numerous cancers, cardiac and pulmonary disease as well as neurologic complications. Counseled for greater than 3 minutes  Esophageal reflux Avoid offending foods, start probiotics. Do not eat large meals in late evening and consider raising head of bed.   COPD (chronic obstructive pulmonary disease) No recent flares, encouraged ongoing smoking cessation.  Pedal edema Encouraged to minimize sodium elevate legs above heart. Try compression hose. May use Lasix prn.  Abdominal aortic aneurysm Repeat ultrasound ordered

## 2014-05-13 NOTE — Assessment & Plan Note (Addendum)
No recent flares, encouraged ongoing smoking cessation.

## 2014-05-13 NOTE — Assessment & Plan Note (Signed)
Avoid offending foods, start probiotics. Do not eat large meals in late evening and consider raising head of bed.  

## 2014-05-13 NOTE — Assessment & Plan Note (Signed)
Repeat ultrasound ordered.

## 2014-05-16 ENCOUNTER — Other Ambulatory Visit: Payer: Self-pay | Admitting: Family Medicine

## 2014-05-16 ENCOUNTER — Ambulatory Visit (HOSPITAL_BASED_OUTPATIENT_CLINIC_OR_DEPARTMENT_OTHER)
Admission: RE | Admit: 2014-05-16 | Discharge: 2014-05-16 | Disposition: A | Discharge status: Home / Self Care | Payer: Medicare HMO | Source: Ambulatory Visit | Attending: Family Medicine | Admitting: Family Medicine

## 2014-05-16 ENCOUNTER — Other Ambulatory Visit: Payer: Self-pay | Admitting: Hematology & Oncology

## 2014-05-16 DIAGNOSIS — R0609 Other forms of dyspnea: Secondary | ICD-10-CM | POA: Insufficient documentation

## 2014-05-16 DIAGNOSIS — I369 Nonrheumatic tricuspid valve disorder, unspecified: Secondary | ICD-10-CM

## 2014-05-16 DIAGNOSIS — I517 Cardiomegaly: Secondary | ICD-10-CM

## 2014-05-16 DIAGNOSIS — Z720 Tobacco use: Secondary | ICD-10-CM | POA: Diagnosis not present

## 2014-05-16 DIAGNOSIS — R0600 Dyspnea, unspecified: Secondary | ICD-10-CM

## 2014-05-16 DIAGNOSIS — Z1231 Encounter for screening mammogram for malignant neoplasm of breast: Secondary | ICD-10-CM

## 2014-05-16 DIAGNOSIS — E119 Type 2 diabetes mellitus without complications: Secondary | ICD-10-CM | POA: Diagnosis not present

## 2014-05-16 NOTE — Progress Notes (Signed)
Echocardiogram 2D Echocardiogram has been performed.  Ashley Savage 05/16/2014, 12:54 PM

## 2014-05-22 ENCOUNTER — Ambulatory Visit (HOSPITAL_BASED_OUTPATIENT_CLINIC_OR_DEPARTMENT_OTHER)
Admission: RE | Admit: 2014-05-22 | Discharge: 2014-05-22 | Disposition: A | Discharge status: Home / Self Care | Payer: Medicare HMO | Source: Ambulatory Visit | Attending: Family Medicine | Admitting: Family Medicine

## 2014-05-22 ENCOUNTER — Other Ambulatory Visit: Payer: Self-pay | Admitting: Family Medicine

## 2014-05-22 DIAGNOSIS — I714 Abdominal aortic aneurysm, without rupture, unspecified: Secondary | ICD-10-CM

## 2014-05-31 ENCOUNTER — Emergency Department (HOSPITAL_BASED_OUTPATIENT_CLINIC_OR_DEPARTMENT_OTHER)
Admission: EM | Admit: 2014-05-31 | Discharge: 2014-06-01 | Disposition: A | Discharge status: Home / Self Care | Payer: Medicare HMO | Attending: Emergency Medicine | Admitting: Emergency Medicine

## 2014-05-31 ENCOUNTER — Emergency Department (HOSPITAL_BASED_OUTPATIENT_CLINIC_OR_DEPARTMENT_OTHER): Payer: Medicare HMO

## 2014-05-31 ENCOUNTER — Encounter (HOSPITAL_BASED_OUTPATIENT_CLINIC_OR_DEPARTMENT_OTHER): Payer: Self-pay | Admitting: *Deleted

## 2014-05-31 DIAGNOSIS — Z853 Personal history of malignant neoplasm of breast: Secondary | ICD-10-CM | POA: Insufficient documentation

## 2014-05-31 DIAGNOSIS — E785 Hyperlipidemia, unspecified: Secondary | ICD-10-CM | POA: Insufficient documentation

## 2014-05-31 DIAGNOSIS — Z79899 Other long term (current) drug therapy: Secondary | ICD-10-CM | POA: Diagnosis not present

## 2014-05-31 DIAGNOSIS — Z87440 Personal history of urinary (tract) infections: Secondary | ICD-10-CM | POA: Insufficient documentation

## 2014-05-31 DIAGNOSIS — Z8669 Personal history of other diseases of the nervous system and sense organs: Secondary | ICD-10-CM | POA: Diagnosis not present

## 2014-05-31 DIAGNOSIS — Z720 Tobacco use: Secondary | ICD-10-CM | POA: Insufficient documentation

## 2014-05-31 DIAGNOSIS — Z87448 Personal history of other diseases of urinary system: Secondary | ICD-10-CM | POA: Insufficient documentation

## 2014-05-31 DIAGNOSIS — Z7951 Long term (current) use of inhaled steroids: Secondary | ICD-10-CM | POA: Diagnosis not present

## 2014-05-31 DIAGNOSIS — J441 Chronic obstructive pulmonary disease with (acute) exacerbation: Secondary | ICD-10-CM | POA: Diagnosis not present

## 2014-05-31 DIAGNOSIS — K219 Gastro-esophageal reflux disease without esophagitis: Secondary | ICD-10-CM | POA: Insufficient documentation

## 2014-05-31 DIAGNOSIS — E119 Type 2 diabetes mellitus without complications: Secondary | ICD-10-CM | POA: Diagnosis not present

## 2014-05-31 DIAGNOSIS — R410 Disorientation, unspecified: Secondary | ICD-10-CM | POA: Diagnosis not present

## 2014-05-31 DIAGNOSIS — R4182 Altered mental status, unspecified: Secondary | ICD-10-CM | POA: Diagnosis present

## 2014-05-31 DIAGNOSIS — R350 Frequency of micturition: Secondary | ICD-10-CM | POA: Insufficient documentation

## 2014-05-31 DIAGNOSIS — Z8739 Personal history of other diseases of the musculoskeletal system and connective tissue: Secondary | ICD-10-CM | POA: Diagnosis not present

## 2014-05-31 LAB — CBC WITH DIFFERENTIAL/PLATELET
BASOS ABS: 0 10*3/uL (ref 0.0–0.1)
BASOS PCT: 1 % (ref 0–1)
EOS PCT: 3 % (ref 0–5)
Eosinophils Absolute: 0.2 10*3/uL (ref 0.0–0.7)
HCT: 40.1 % (ref 36.0–46.0)
Hemoglobin: 13.8 g/dL (ref 12.0–15.0)
Lymphocytes Relative: 20 % (ref 12–46)
Lymphs Abs: 1.3 10*3/uL (ref 0.7–4.0)
MCH: 31.4 pg (ref 26.0–34.0)
MCHC: 34.4 g/dL (ref 30.0–36.0)
MCV: 91.1 fL (ref 78.0–100.0)
Monocytes Absolute: 1 10*3/uL (ref 0.1–1.0)
Monocytes Relative: 16 % — ABNORMAL HIGH (ref 3–12)
NEUTROS ABS: 3.7 10*3/uL (ref 1.7–7.7)
Neutrophils Relative %: 61 % (ref 43–77)
Platelets: 112 10*3/uL — ABNORMAL LOW (ref 150–400)
RBC: 4.4 MIL/uL (ref 3.87–5.11)
RDW: 13.9 % (ref 11.5–15.5)
WBC: 6.1 10*3/uL (ref 4.0–10.5)

## 2014-05-31 LAB — COMPREHENSIVE METABOLIC PANEL
ALBUMIN: 3.4 g/dL — AB (ref 3.5–5.2)
ALK PHOS: 114 U/L (ref 39–117)
ALT: 20 U/L (ref 0–35)
ANION GAP: 13 (ref 5–15)
AST: 30 U/L (ref 0–37)
BUN: 9 mg/dL (ref 6–23)
CALCIUM: 9.4 mg/dL (ref 8.4–10.5)
CO2: 26 mEq/L (ref 19–32)
Chloride: 103 mEq/L (ref 96–112)
Creatinine, Ser: 0.6 mg/dL (ref 0.50–1.10)
GFR calc non Af Amer: 90 mL/min — ABNORMAL LOW (ref >90)
GLUCOSE: 115 mg/dL — AB (ref 70–99)
Potassium: 4.3 mEq/L (ref 3.7–5.3)
Sodium: 142 mEq/L (ref 137–147)
TOTAL PROTEIN: 6.5 g/dL (ref 6.0–8.3)
Total Bilirubin: 1.2 mg/dL (ref 0.3–1.2)

## 2014-05-31 LAB — URINALYSIS, ROUTINE W REFLEX MICROSCOPIC
BILIRUBIN URINE: NEGATIVE
GLUCOSE, UA: NEGATIVE mg/dL
HGB URINE DIPSTICK: NEGATIVE
Ketones, ur: NEGATIVE mg/dL
Leukocytes, UA: NEGATIVE
Nitrite: NEGATIVE
PH: 7 (ref 5.0–8.0)
Protein, ur: NEGATIVE mg/dL
SPECIFIC GRAVITY, URINE: 1.009 (ref 1.005–1.030)
UROBILINOGEN UA: 2 mg/dL — AB (ref 0.0–1.0)

## 2014-05-31 NOTE — ED Provider Notes (Signed)
CSN: [id number]     Arrival date & time 05/31/14  2012 History  This chart was scribed for Ashley Arthurs, MD by Randa Evens, ED Scribe. This patient was seen in room MH01/MH01 and the patient's care was started at 8:57 PM.    Chief Complaint  Patient presents with  . Altered Mental Status   HPI HPI Comments: Ashley Savage is a 73 y.o. female with PMHx of COPD who presents to the Emergency Department complaining of altered mental status onset 2 days ago. She states she has associated confusion. Pt states she has urinary frequency and a dark hazy colored urine. She states she had a similar symptoms about 1 month ago and was diagnosed with a UTI. She states has been confused about trying to do simple math such as writing checks and completing sodoku puzzles. Pt also states that when she had to urinate she would go to the kitchen instead of the restroom. She states she has been drinking more water and cranberry juice with no relief. She states she has been confused about several daily activities. Denies dysuria, fever or other related symptoms. Had similar episode a month ago and had nl CT head and was diagnosed with UTI.     Past Medical History  Diagnosis Date  . Emphysema   . Diabetes mellitus type 2  . Hyperlipidemia   . Cancer breast ca  right  . Anxiety   . Panic attacks   . Depression   . Arthritis of both knees 10/01/2013  . Benign paroxysmal positional vertigo 10/01/2013  . Neck pain 10/01/2013  . Esophageal reflux 10/01/2013  . Pedal edema 12/10/2013  . Overactive bladder 12/10/2013  . Tobacco abuse disorder 02/01/2014  . COPD (chronic obstructive pulmonary disease) 10/01/2013   Past Surgical History  Procedure Laterality Date  . Gallbladder surgery  1992  . Breast surgery  2009 right  . Appendectomy  2007  . Knee surgery    . Mandible fracture surgery    . Pilonidal cyst excision    . Tonsillectomy     Family History  Problem Relation Age of Onset  . Heart failure Father   .  COPD Father   . Arthritis Father 49  . Pneumonia Sister   . Breast cancer    . Stroke Mother   . Arthritis Mother 42  . Hyperlipidemia Mother   . Hypertension Mother   . Diabetes Mother   . Breast cancer Maternal Aunt   . Alcohol abuse Maternal Uncle    History  Substance Use Topics  . Smoking status: Current Some Day Smoker -- 0.05 packs/day    Types: Cigarettes    Start date: 07/27/1964  . Smokeless tobacco: Never Used     Comment: still smoking  03-28-14  . Alcohol Use: No   OB History    No data available     Review of Systems  Constitutional: Negative for fever.  Genitourinary: Positive for frequency. Negative for dysuria.  Psychiatric/Behavioral: Positive for confusion.  All other systems reviewed and are negative.   Allergies  Citalopram and Erythromycin  Home Medications   Prior to Admission medications   Medication Sig Start Date End Date Taking? Authorizing Provider  albuterol (PROVENTIL HFA;VENTOLIN HFA) 108 (90 BASE) MCG/ACT inhaler Inhale 2 puffs into the lungs every 6 (six) hours as needed for wheezing or shortness of breath. Only dispense Ventolin 09/26/13   Mosie Lukes, MD  albuterol (PROVENTIL) (2.5 MG/3ML) 0.083% nebulizer solution Inhale one vial  via nebulizer four times daily as needed    Elsie Stain, MD  Blood Glucose Monitoring Suppl (ONE TOUCH ULTRA SYSTEM KIT) W/DEVICE KIT 1 kit by Does not apply route once.    Historical Provider, MD  cephALEXin (KEFLEX) 500 MG capsule Take 1 capsule (500 mg total) by mouth 3 (three) times daily. 05/10/14   Evelina Bucy, MD  Cholecalciferol (VITAMIN D3) 2000 UNITS TABS Take by mouth daily.     Historical Provider, MD  diazepam (VALIUM) 5 MG tablet Take 1 tablet (5 mg total) by mouth daily as needed for anxiety or muscle spasms. 1 daily as needed 12/08/13   Mosie Lukes, MD  Fluticasone-Salmeterol (ADVAIR DISKUS) 250-50 MCG/DOSE AEPB INHALE ONE PUFF BY MOUTH TWICE DAILY 07/13/13   Elsie Stain, MD   furosemide (LASIX) 20 MG tablet Take 1 tablet (20 mg total) by mouth daily as needed for fluid or edema (weight gain>3# in 24 hours). 05/08/14   Mosie Lukes, MD  lovastatin (MEVACOR) 20 MG tablet Take 1 tablet (20 mg total) by mouth at bedtime. 20 mg 1 by mouth daily 10/23/13   Mosie Lukes, MD  metFORMIN (GLUCOPHAGE) 500 MG tablet Take 500 mg by mouth 2 (two) times daily with a meal. 12/11/13   Mosie Lukes, MD  omeprazole (PRILOSEC OTC) 20 MG tablet Take 20 mg by mouth daily.      Historical Provider, MD  ONE TOUCH ULTRA TEST test strip by Other route as needed.  11/12/13   Historical Provider, MD  Respiratory Therapy Supplies (FLUTTER) DEVI Use 3-4 times daily as needed 12/14/13   Elsie Stain, MD   Triage Vitals: BP 137/65 mmHg  Pulse 86  Temp(Src) 97.8 F (36.6 C) (Oral)  Resp 30  Ht '5\' 7"'  (1.702 m)  Wt 155 lb (70.308 kg)  BMI 24.27 kg/m2  SpO2 95%  Physical Exam  Constitutional: She is oriented to person, place, and time. She appears well-developed and well-nourished. No distress.  HENT:  Head: Normocephalic and atraumatic.  Mouth/Throat: Oropharynx is clear and moist.  Eyes: Conjunctivae and EOM are normal.  Neck: Neck supple.  Cardiovascular: Normal rate.   Pulmonary/Chest: Effort normal. No respiratory distress. She has wheezes.  Minimal wheezing throughout  Musculoskeletal: Normal range of motion.  Neurological: She is alert and oriented to person, place, and time.  Skin: Skin is warm and dry.  Psychiatric: She has a normal mood and affect. Her behavior is normal.  Nursing note and vitals reviewed.   ED Course  Procedures (including critical care time) DIAGNOSTIC STUDIES: Oxygen Saturation is 95% on RA, adequate by my interpretation.    COORDINATION OF CARE: 9:05 PM-Discussed treatment plan which includesCBC panel, CMP and UA with pt at bedside and pt agreed to plan.     Labs Review Labs Reviewed  URINALYSIS, ROUTINE W REFLEX MICROSCOPIC - Abnormal;  Notable for the following:    Urobilinogen, UA 2.0 (*)    All other components within normal limits  CBC WITH DIFFERENTIAL - Abnormal; Notable for the following:    Platelets 112 (*)    Monocytes Relative 16 (*)    All other components within normal limits  COMPREHENSIVE METABOLIC PANEL - Abnormal; Notable for the following:    Glucose, Bld 115 (*)    Albumin 3.4 (*)    GFR calc non Af Amer 90 (*)    All other components within normal limits  URINE CULTURE    Imaging Review Dg Chest 2 View  05/31/2014   CLINICAL DATA:  Altered mental status.  EXAM: CHEST  2 VIEW  COMPARISON:  05/10/2014  FINDINGS: Heart size and pulmonary vascularity are normal. No infiltrates or effusions. Previous right mastectomy. No osseous abnormality. The lungs are somewhat hyperinflated consistent with emphysema.  IMPRESSION: No acute abnormality.  Emphysema.   Electronically Signed   By: Rozetta Nunnery M.D.   On: 05/31/2014 21:49     EKG Interpretation None      MDM   Final diagnoses:  Confusion   MISA FEDORKO is a 73 y.o. female here with mild confusion. A &O x 3 here. Well appearing. Labs and UA and CXR unremarkable. Has COPD and always wheezing. Not hypoxic. 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 Can be followed up outpatient. I doubt stroke.     I personally performed the services described in this documentation, which was scribed in my presence. The recorded information has been reviewed and is accurate.    Ashley Arthurs, MD 05/31/14 [phone]

## 2014-05-31 NOTE — Discharge Instructions (Signed)
Continue current medicines.   Follow up with your doctor. You may need further workup with your doctor.   Return to ER if you have more confusion.

## 2014-05-31 NOTE — ED Notes (Signed)
States she is confused. Was unable to do math to write checked and work puzzles she normally does. Last time she had same symptoms she had a UTI. She drove herself here.

## 2014-05-31 NOTE — ED Notes (Signed)
MD at bedside. 

## 2014-06-04 ENCOUNTER — Ambulatory Visit (INDEPENDENT_AMBULATORY_CARE_PROVIDER_SITE_OTHER): Payer: Medicare HMO | Admitting: Family

## 2014-06-04 ENCOUNTER — Encounter: Payer: Self-pay | Admitting: Family

## 2014-06-04 VITALS — BP 110/60 | HR 74 | Temp 97.9°F | Resp 18 | Ht 67.0 in | Wt 153.6 lb

## 2014-06-04 DIAGNOSIS — M25561 Pain in right knee: Secondary | ICD-10-CM

## 2014-06-04 DIAGNOSIS — R4182 Altered mental status, unspecified: Secondary | ICD-10-CM

## 2014-06-04 DIAGNOSIS — F05 Delirium due to known physiological condition: Secondary | ICD-10-CM

## 2014-06-04 LAB — URINE CULTURE: Colony Count: 40000

## 2014-06-04 MED ORDER — MELOXICAM 7.5 MG PO TABS: 7.5000 mg | ORAL_TABLET | Freq: Every day | ORAL | Status: DC

## 2014-06-04 NOTE — Progress Notes (Signed)
Pre visit review using our clinic review tool, if applicable. No additional management support is needed unless otherwise documented below in the visit note. 

## 2014-06-04 NOTE — Patient Instructions (Signed)
Start meloxicam once daily as needed for knee pain, and they follow through with [facility] appointment when arranged. Call if you develop recurrent confusion, weakness. Go to ER if severe.

## 2014-06-04 NOTE — Progress Notes (Signed)
Subjective:    [name], female    DOB: January 31, 1941, 73 y.o.   MRN: [id number]  HPI  Ashley Savage is a 73 yr old female who presents today for ED follow up. Was seen in the ED on 10/15 due to a 2 week history of altered mental status. Records are reviewed. She had a negative CT head and neg cxr. Urinalysis showed trace leukocytes.   Pt reports on this episode she thought she was on the toilet, but in fact was sitting on the bed and wet the bad.  Notes that she was having trouble with her check book. Could no longer do her sudoku puzzles.  She was not treated with abx at that time. Notes that symptoms had gotten some better, but then worsened and she returned to the ED on 11/5. Was told that she had UTI and was given 10 day course of abx.  Notes that the majority of the confusion was gone in 3 days. Reports that she feels clear today and is feeling better.   She returned to the ED on 11/5 with similar confusion.  Urine culture 4 days ago grew 40 K of multiple morphotypes.   Patient lives alone. Has family Ashley Savage.  Sister lives in [place].    Knee pain- She is awaiting an appointment with [facility] ortho.    Review of Systems    see HPI  Past Medical History  Diagnosis Date  . Emphysema   . Diabetes mellitus type 2  . Hyperlipidemia   . Cancer breast ca  right  . Anxiety   . Panic attacks   . Depression   . Arthritis of both knees 10/01/2013  . Benign paroxysmal positional vertigo 10/01/2013  . Neck pain 10/01/2013  . Esophageal reflux 10/01/2013  . Pedal edema 12/10/2013  . Overactive bladder 12/10/2013  . Tobacco abuse disorder 02/01/2014  . COPD (chronic obstructive pulmonary disease) 10/01/2013    History   Social History  . Marital Status: Single    Spouse Name: N/A    Number of Children: 0  . Years of Education: N/A   Occupational History  . retire    Social History Main Topics  . Smoking status: Current Some Day Smoker -- 0.05 packs/day    Types: Cigarettes   Start date: 07/27/1964  . Smokeless tobacco: Never Used     Comment: still smoking  03-28-14  . Alcohol Use: No  . Drug Use: No  . Sexual Activity: Yes    Birth Control/ Protection: Post-menopausal   Other Topics Concern  . Not on file   Social History Narrative    Past Surgical History  Procedure Laterality Date  . Gallbladder surgery  1992  . Breast surgery  2009 right  . Appendectomy  2007  . Knee surgery    . Mandible fracture surgery    . Pilonidal cyst excision    . Tonsillectomy      Family History  Problem Relation Age of Onset  . Heart failure Father   . COPD Father   . Arthritis Father 73  . Pneumonia Sister   . Breast cancer    . Stroke Mother   . Arthritis Mother 29  . Hyperlipidemia Mother   . Hypertension Mother   . Diabetes Mother   . Breast cancer Maternal Aunt   . Alcohol abuse Maternal Uncle     Allergies  Allergen Reactions  . Citalopram     Confusion, irregular heart beat.  Marland Kitchen  Erythromycin     Stomach cramps    Current Outpatient Prescriptions on File Prior to Visit  Medication Sig Dispense Refill  . albuterol (PROVENTIL HFA;VENTOLIN HFA) 108 (90 BASE) MCG/ACT inhaler Inhale 2 puffs into the lungs every 6 (six) hours as needed for wheezing or shortness of breath. Only dispense Ventolin 1 Inhaler 3  . albuterol (PROVENTIL) (2.5 MG/3ML) 0.083% nebulizer solution Inhale one vial via nebulizer four times daily as needed 375 mL 4  . Blood Glucose Monitoring Suppl (ONE TOUCH ULTRA SYSTEM KIT) W/DEVICE KIT 1 kit by Does not apply route once.    . cephALEXin (KEFLEX) 500 MG capsule Take 1 capsule (500 mg total) by mouth 3 (three) times daily. 30 capsule 0  . Cholecalciferol (VITAMIN D3) 2000 UNITS TABS Take by mouth daily.     . diazepam (VALIUM) 5 MG tablet Take 1 tablet (5 mg total) by mouth daily as needed for anxiety or muscle spasms. 1 daily as needed 30 tablet 3  . Fluticasone-Salmeterol (ADVAIR DISKUS) 250-50 MCG/DOSE AEPB INHALE ONE PUFF BY  MOUTH TWICE DAILY 14 each 0  . furosemide (LASIX) 20 MG tablet Take 1 tablet (20 mg total) by mouth daily as needed for fluid or edema (weight gain>3# in 24 hours). 30 tablet 1  . lovastatin (MEVACOR) 20 MG tablet Take 1 tablet (20 mg total) by mouth at bedtime. 20 mg 1 by mouth daily 90 tablet 1  . metFORMIN (GLUCOPHAGE) 500 MG tablet Take 500 mg by mouth 2 (two) times daily with a meal.    . omeprazole (PRILOSEC OTC) 20 MG tablet Take 20 mg by mouth daily.      . ONE TOUCH ULTRA TEST test strip by Other route as needed.     Marland Kitchen Respiratory Therapy Supplies (FLUTTER) DEVI Use 3-4 times daily as needed     No current facility-administered medications on file prior to visit.    BP 110/60 mmHg  Pulse 74  Temp(Src) 97.9 F (36.6 C) (Oral)  Resp 18  Ht '5\' 7"'  (1.702 m)  Wt 153 lb 9.6 oz (69.673 kg)  BMI 24.05 kg/m2  SpO2 96%    Objective:   Physical Exam  Constitutional: She is oriented to person, place, and time. She appears well-developed and well-nourished. No distress.  HENT:  Head: Normocephalic and atraumatic.  Cardiovascular: Normal rate and regular rhythm.   No murmur heard. Pulmonary/Chest: Effort normal and breath sounds normal. No respiratory distress. She has no wheezes. She has no rales. She exhibits no tenderness.  Neurological: She is alert and oriented to person, place, and time.     Psychiatric: She has a normal mood and affect. Her behavior is normal. Judgment and thought content normal.  Speech is somewhat rambling.          Assessment & Plan:

## 2014-06-06 LAB — URINE CULTURE
COLONY COUNT: NO GROWTH
ORGANISM ID, BACTERIA: NO GROWTH

## 2014-06-07 ENCOUNTER — Encounter: Payer: Self-pay | Admitting: Family

## 2014-06-07 ENCOUNTER — Telehealth: Payer: Self-pay | Admitting: Family

## 2014-06-07 DIAGNOSIS — M25561 Pain in right knee: Secondary | ICD-10-CM | POA: Insufficient documentation

## 2014-06-07 DIAGNOSIS — K7682 Hepatic encephalopathy: Secondary | ICD-10-CM

## 2014-06-07 DIAGNOSIS — R404 Transient alteration of awareness: Secondary | ICD-10-CM

## 2014-06-07 DIAGNOSIS — K7290 Hepatic failure, unspecified without coma: Secondary | ICD-10-CM | POA: Insufficient documentation

## 2014-06-07 HISTORY — DX: Hepatic failure, unspecified without coma: K72.90

## 2014-06-07 HISTORY — DX: Hepatic encephalopathy: K76.82

## 2014-06-07 NOTE — Assessment & Plan Note (Signed)
She appears to be back at baseline. Repeat urine culture is negative. ?TIA.  Will refer to neuro for further evaluation.

## 2014-06-07 NOTE — Telephone Encounter (Signed)
Please contact pt and let her know that I reviewed her chart and I think she would benefit from a neuro consult to help [place] understand why she is having these episodes of confusion.

## 2014-06-07 NOTE — Assessment & Plan Note (Signed)
Will give short course of meloxicam while she waits to get in with ortho.

## 2014-06-08 NOTE — Telephone Encounter (Signed)
Left message on home # to return my call. 

## 2014-06-08 NOTE — Telephone Encounter (Signed)
Notified pt and she is agreeable to proceed with referral. 

## 2014-06-15 ENCOUNTER — Encounter: Payer: Self-pay | Admitting: Cardiology

## 2014-06-15 ENCOUNTER — Ambulatory Visit (INDEPENDENT_AMBULATORY_CARE_PROVIDER_SITE_OTHER): Payer: Medicare HMO | Admitting: Cardiology

## 2014-06-15 VITALS — BP 134/58 | HR 88 | Ht 67.0 in | Wt 155.0 lb

## 2014-06-15 DIAGNOSIS — J42 Unspecified chronic bronchitis: Secondary | ICD-10-CM

## 2014-06-15 DIAGNOSIS — I5189 Other ill-defined heart diseases: Secondary | ICD-10-CM

## 2014-06-15 DIAGNOSIS — R600 Localized edema: Secondary | ICD-10-CM

## 2014-06-15 DIAGNOSIS — R0989 Other specified symptoms and signs involving the circulatory and respiratory systems: Secondary | ICD-10-CM

## 2014-06-15 DIAGNOSIS — I519 Heart disease, unspecified: Secondary | ICD-10-CM

## 2014-06-15 DIAGNOSIS — R0602 Shortness of breath: Secondary | ICD-10-CM

## 2014-06-15 DIAGNOSIS — R609 Edema, unspecified: Secondary | ICD-10-CM

## 2014-06-15 DIAGNOSIS — Z01818 Encounter for other preprocedural examination: Secondary | ICD-10-CM

## 2014-06-15 DIAGNOSIS — Z720 Tobacco use: Secondary | ICD-10-CM

## 2014-06-15 MED ORDER — FUROSEMIDE 20 MG PO TABS: 20.0000 mg | ORAL_TABLET | Freq: Every day | ORAL | Status: DC

## 2014-06-15 NOTE — Progress Notes (Signed)
[place], [place], [place]  [address] Phone: [phone] Fax:  [phone]  Date:  06/15/2014   ID:  Ashley Savage, DOB 11-Jan-1941, MRN [id number]  PCP:  Penni Homans, MD  Cardiologist:  Fransico Him, MD    History of Present Illness: Ashley Savage is a 73 y.o. female with a history of DM, dyslipidemia, GERD, COPD and ongoing tobacco use who presents today for evaluation of LE edema and diastolic dysfunction noted on echo.  She has chronic SOB due to COPD and was having some LE edema and an echo was ordered.  She also wants to get a knee replacement in January and needs preoperative clearance.  She denies any chest pain or pressure.  She says that her chronic SOB is stable.  She does not use home O2.  She denies any palpitations, dizziness or syncope.   Wt Readings from Last 3 Encounters:  06/15/14 155 lb (70.308 kg)  06/04/14 153 lb 9.6 oz (69.673 kg)  05/31/14 155 lb (70.308 kg)     Past Medical History  Diagnosis Date  . Emphysema   . Diabetes mellitus type 2  . Hyperlipidemia   . Cancer breast ca  right  . Anxiety   . Panic attacks   . Depression   . Arthritis of both knees 10/01/2013  . Benign paroxysmal positional vertigo 10/01/2013  . Neck pain 10/01/2013  . Esophageal reflux 10/01/2013  . Pedal edema 12/10/2013  . Overactive bladder 12/10/2013  . Tobacco abuse disorder 02/01/2014  . COPD (chronic obstructive pulmonary disease) 10/01/2013    Current Outpatient Prescriptions  Medication Sig Dispense Refill  . albuterol (PROVENTIL HFA;VENTOLIN HFA) 108 (90 BASE) MCG/ACT inhaler Inhale 2 puffs into the lungs every 6 (six) hours as needed for wheezing or shortness of breath. Only dispense Ventolin 1 Inhaler 3  . albuterol (PROVENTIL) (2.5 MG/3ML) 0.083% nebulizer solution Inhale one vial via nebulizer four times daily as needed 375 mL 4  . Blood Glucose Monitoring Suppl (ONE TOUCH ULTRA SYSTEM KIT) W/DEVICE KIT 1 kit by Does not apply route once.    . cephALEXin  (KEFLEX) 500 MG capsule Take 1 capsule (500 mg total) by mouth 3 (three) times daily. 30 capsule 0  . Cholecalciferol (VITAMIN D3) 2000 UNITS TABS Take by mouth daily.     . diazepam (VALIUM) 5 MG tablet Take 1 tablet (5 mg total) by mouth daily as needed for anxiety or muscle spasms. 1 daily as needed 30 tablet 3  . Fluticasone-Salmeterol (ADVAIR DISKUS) 250-50 MCG/DOSE AEPB INHALE ONE PUFF BY MOUTH TWICE DAILY 14 each 0  . furosemide (LASIX) 20 MG tablet Take 1 tablet (20 mg total) by mouth daily as needed for fluid or edema (weight gain>3# in 24 hours). 30 tablet 1  . lovastatin (MEVACOR) 20 MG tablet Take 1 tablet (20 mg total) by mouth at bedtime. 20 mg 1 by mouth daily 90 tablet 1  . meloxicam (MOBIC) 7.5 MG tablet Take 1 tablet (7.5 mg total) by mouth daily. 14 tablet 0  . metFORMIN (GLUCOPHAGE) 500 MG tablet Take 500 mg by mouth 2 (two) times daily with a meal.    . omeprazole (PRILOSEC OTC) 20 MG tablet Take 20 mg by mouth daily.      . ONE TOUCH ULTRA TEST test strip by Other route as needed.     Marland Kitchen Respiratory Therapy Supplies (FLUTTER) DEVI Use 3-4 times daily as needed     No current facility-administered medications for  this visit.    Allergies:    Allergies  Allergen Reactions  . Citalopram     Confusion, irregular heart beat.  . Erythromycin     Stomach cramps    Social History:  The patient  reports that she has been smoking Cigarettes.  She started smoking about 49 years ago. She has been smoking about 0.05 packs per day. She has never used smokeless tobacco. She reports that she does not drink alcohol or use illicit drugs.   Family History:  The patient's family history includes Alcohol abuse in her maternal uncle; Arthritis (age of onset: 25) in her father and mother; Breast cancer in her maternal aunt and another family member; COPD in her father; Diabetes in her mother; Heart failure in her father; Hyperlipidemia in her mother; Hypertension in her mother; Pneumonia in  her sister; Stroke in her mother.   ROS:  Please see the history of present illness.      All other systems reviewed and negative.   PHYSICAL EXAM: VS:  BP 134/58 mmHg  Pulse 88  Ht _0  (1.702 m)  Wt 155 lb (70.308 kg)  BMI 24.27 kg/m2  SpO2 95% Well nourished, well developed, in no acute distress HEENT: normal Neck: no JVD Cardiac:  normal S1, S2; RRR; no murmur Lungs:  clear to auscultation bilaterally, no wheezing, rhonchi or rales Abd: soft, nontender, no hepatomegaly Ext: 1+ edema Skin: warm and dry Neuro:  CNs 2-12 intact, no focal abnormalities noted  EKG:       ASSESSMENT AND PLAN:  1. Chronic SOB due to COPD which she says is stable.  Give her diastolic dysfunction on echo I will check a BNP.   2. Chronic LE edema with 2D echo showing normal LVF with diastolic dysfunction.  There were no signs of right heart failure on echo.  No evidence of pulmonary HTN.  She is using compression stockings and lasix.  Start lasix 62m daily instead of PRN.  Check BMET in 1 week. 3. HTN well controlled 4. DM type II - per PCP 5. COPD - per Dr. WJoya Gaskins6. Knee pain needing preoperative clearance for knee replacement. She has multiple CRF including HTN, tobacco use, DM.  I will get a Lexiscan myoview to rule out ischemia. 7. Ongoing tobacco abuse - I have counseled her on tobacco cessation.  She has not had any success with any of the tobacco cessation drugs but she has not wanted to quit in the past. She is slowly cutting back. 8. Left carotid artery bruit - will get carotid artery dopplers  Followup with PA in 3 weeks and me PRN pending results of studies  Signed, TFransico Him MD [facility] 06/15/2014 4:11 PM

## 2014-06-15 NOTE — Patient Instructions (Addendum)
Your physician has requested that you have a lexiscan myoview. For further information please visit [email or web address]. Please follow instruction sheet, as given.  Your physician has requested that you have a carotid duplex. This test is an ultrasound of the carotid arteries in your neck. It looks at blood flow through these arteries that supply the brain with blood. Allow one hour for this exam. There are no restrictions or special instructions.  Your physician recommends that you have lab work TODAY (BNP)  Your physician recommends that you return for lab work in [place] (BMET)  Your physician has recommended you make the following change in your medication:  1) START 20 mg Lasix daily   Your physician recommends that you schedule a follow-up appointment in: 3 weeks with PA.  Your physician recommends that you schedule a follow-up appointment AS NEEDED with Dr. Radford Pax.

## 2014-06-22 ENCOUNTER — Other Ambulatory Visit: Payer: Self-pay | Admitting: Cardiology

## 2014-06-22 LAB — BASIC METABOLIC PANEL
BUN: 11 mg/dL (ref 6–23)
CALCIUM: 8.8 mg/dL (ref 8.4–10.5)
CO2: 30 mEq/L (ref 19–32)
Chloride: 99 mEq/L (ref 96–112)
Creat: 0.64 mg/dL (ref 0.50–1.10)
GLUCOSE: 173 mg/dL — AB (ref 70–99)
Potassium: 3.7 mEq/L (ref 3.5–5.3)
Sodium: 139 mEq/L (ref 135–145)

## 2014-06-25 ENCOUNTER — Encounter (HOSPITAL_BASED_OUTPATIENT_CLINIC_OR_DEPARTMENT_OTHER): Payer: Medicare HMO | Admitting: Radiology

## 2014-06-25 ENCOUNTER — Ambulatory Visit (HOSPITAL_COMMUNITY): Payer: Medicare HMO | Attending: Cardiology | Admitting: Cardiology

## 2014-06-25 DIAGNOSIS — R0609 Other forms of dyspnea: Secondary | ICD-10-CM | POA: Diagnosis not present

## 2014-06-25 DIAGNOSIS — I6523 Occlusion and stenosis of bilateral carotid arteries: Secondary | ICD-10-CM

## 2014-06-25 DIAGNOSIS — R0602 Shortness of breath: Secondary | ICD-10-CM

## 2014-06-25 DIAGNOSIS — I10 Essential (primary) hypertension: Secondary | ICD-10-CM | POA: Diagnosis not present

## 2014-06-25 DIAGNOSIS — J449 Chronic obstructive pulmonary disease, unspecified: Secondary | ICD-10-CM | POA: Diagnosis not present

## 2014-06-25 DIAGNOSIS — E119 Type 2 diabetes mellitus without complications: Secondary | ICD-10-CM | POA: Diagnosis not present

## 2014-06-25 DIAGNOSIS — R0989 Other specified symptoms and signs involving the circulatory and respiratory systems: Secondary | ICD-10-CM

## 2014-06-25 DIAGNOSIS — R002 Palpitations: Secondary | ICD-10-CM | POA: Diagnosis not present

## 2014-06-25 DIAGNOSIS — Z01818 Encounter for other preprocedural examination: Secondary | ICD-10-CM

## 2014-06-25 MED ORDER — REGADENOSON 0.4 MG/5ML IV SOLN
0.4000 mg | Freq: Once | INTRAVENOUS | Status: AC
  Administered 2014-06-25: 0.4 mg via INTRAVENOUS

## 2014-06-25 MED ORDER — TECHNETIUM TC 99M SESTAMIBI GENERIC - CARDIOLITE
33.0000 | Freq: Once | INTRAVENOUS | Status: AC | PRN
  Administered 2014-06-25: 33 via INTRAVENOUS

## 2014-06-25 MED ORDER — TECHNETIUM TC 99M SESTAMIBI GENERIC - CARDIOLITE
11.0000 | Freq: Once | INTRAVENOUS | Status: AC | PRN
  Administered 2014-06-25: 11 via INTRAVENOUS

## 2014-06-25 NOTE — Progress Notes (Signed)
Carotid duplex performed 

## 2014-06-25 NOTE — Progress Notes (Signed)
Ashley Savage, Ashley Savage is a 73 y.o. female     MRN : [id number]     DOB: [birth year]  Procedure Date: 06/25/2014  Nuclear Med Background Indication for Stress Test:  Evaluation for Ischemia, and Pending Surgical Clearance for  (R) TKR by [facility] History:  COPD, Emphysema and MPI: 10 yrs ago [place] Cardiac Risk Factors: Hypertension and NIDDM  Symptoms:  DOE, Palpitations and SOB   Nuclear Pre-Procedure Caffeine/Decaff Intake:  None> 12 hrs NPO After: 5:45am   Lungs:  clear O2 Sat: 94% on room air. IV 0.9% NS with Angio Cath:  22g  IV Site: L Antecubital x 1, tolerated well IV Started by:  Irven Baltimore, RN  Chest Size (in):  36 Cup Size: A  Height: 5\' 7"  (1.702 m)  Weight:  153 lb (69.4 kg)  BMI:  Body mass index is 23.96 kg/(m^2). Tech Comments:  Patient took Glucophage this am. Nebulizer Treatment at 0900 this am. Irven Baltimore, RN.    Nuclear Med Study 1 or 2 day study: 1 day  Stress Test Type:  Lexiscan  Reading MD: N/A  Order Authorizing Provider:  Fransico Him, MD  Resting Radionuclide: Technetium 29m Sestamibi  Resting Radionuclide Dose: 11.0 mCi   Stress Radionuclide:  Technetium 42m Sestamibi  Stress Radionuclide Dose: 33.0 mCi           Stress Protocol Rest HR: 93 Stress HR: 109  Rest BP: 114/69 Stress BP: 113/67  Exercise Time (min): n/a METS: n/a   Predicted Max HR: 149 bpm % Max HR: 73.15 bpm Rate Pressure Product: 12426   Dose of Adenosine (mg):  n/a Dose of Lexiscan: 0.4 mg  Dose of Atropine (mg): n/a Dose of Dobutamine: n/a mcg/kg/min (at max HR)  Stress Test Technologist: Perrin Maltese, EMT-P  Nuclear Technologist:  Earl Many, CNMT     Rest Procedure:  Myocardial perfusion imaging was performed at rest 45 minutes following the intravenous administration of Technetium 35m Sestamibi. Rest ECG: NSR -  Normal EKG  Stress Procedure:  The patient received IV Lexiscan 0.4 mg over 15-seconds.  Technetium 14m Sestamibi injected at 30-seconds. This patient had sob with the Lexiscan injection. Quantitative spect images were obtained after a 45 minute delay. Stress ECG: No significant change from baseline ECG  QPS Raw Data Images:  Normal; no motion artifact; normal heart/lung ratio. Stress Images:  Normal homogeneous uptake in all areas of the myocardium. Rest Images:  Normal homogeneous uptake in all areas of the myocardium. Subtraction (SDS):  No evidence of ischemia. Transient Ischemic Dilatation (Normal <1.22):  0.93 Lung/Heart Ratio (Normal <0.45):  0.28  Quantitative Gated Spect Images QGS EDV:  77 ml QGS ESV:  22 ml  Impression Exercise Capacity:  Lexiscan with no exercise. BP Response:  Normal blood pressure response. Clinical Symptoms:  No significant symptoms noted. ECG Impression:  No significant ST segment change suggestive of ischemia. Comparison with Prior Nuclear Study: No images to compare  Overall Impression:  Normal stress nuclear study.  No evidence of ischemia.  Normal LV function.   LV Ejection Fraction: 71%.  LV Wall Motion:  NL LV Function; NL Wall Motion.   Thayer Headings, Brooke Bonito., MD, [facility] 06/25/2014, 5:12 PM [address],  [place] Pager [phone]

## 2014-06-26 LAB — HM DIABETES EYE EXAM

## 2014-06-27 ENCOUNTER — Other Ambulatory Visit: Payer: Self-pay | Admitting: *Deleted

## 2014-06-27 MED ORDER — ASPIRIN EC 81 MG PO TBEC: 81.0000 mg | DELAYED_RELEASE_TABLET | Freq: Every day | ORAL | Status: DC

## 2014-06-28 ENCOUNTER — Encounter: Payer: Self-pay | Admitting: Critical Care Medicine

## 2014-06-28 ENCOUNTER — Ambulatory Visit (INDEPENDENT_AMBULATORY_CARE_PROVIDER_SITE_OTHER): Payer: Medicare HMO | Admitting: Critical Care Medicine

## 2014-06-28 VITALS — BP 122/68 | HR 82 | Temp 97.8°F | Ht 66.5 in | Wt 152.0 lb

## 2014-06-28 DIAGNOSIS — J449 Chronic obstructive pulmonary disease, unspecified: Secondary | ICD-10-CM

## 2014-06-28 DIAGNOSIS — Z720 Tobacco use: Secondary | ICD-10-CM

## 2014-06-28 MED ORDER — FLUTICASONE FUROATE-VILANTEROL 100-25 MCG/INH IN AEPB: 1.0000 | INHALATION_SPRAY | Freq: Every day | RESPIRATORY_TRACT | Status: DC

## 2014-06-28 NOTE — Assessment & Plan Note (Signed)
Tobacco use ongoing Plan Nicotine replacement Rx

## 2014-06-28 NOTE — Assessment & Plan Note (Signed)
Still active smoking Copd gold C Pt wants R TKR Plan Stop advair Start Breo one puff daily Stop smoking, use nicotine lozenges 4mg  and use 2-3 per day No other changes A lung function test will be ordered  Return 3 months

## 2014-06-28 NOTE — Patient Instructions (Signed)
Stop advair Start Breo one puff daily Stop smoking, use nicotine lozenges 4mg  and use 2-3 per day No other changes A lung function test will be ordered  Return 3 months

## 2014-06-28 NOTE — Progress Notes (Signed)
 Subjective:    [name], female    DOB: 05/24/1941, 73 y.o.   MRN: [id number]  HPI   Subjective:    [name], female    DOB: 04/26/1941, 73 y.o.   MRN: [id number]  HPI  06/28/2014 Chief Complaint  Patient presents with  . Follow-up    f/u COPD; breathing little better except in mornings head dries up overnight, nasal passage gets "crusty", dried out; mouth gets dry; coughing up some white thick mucus  Notes is losing weight, 186>>152 in 3 yrs. Appt is fair, notes some chronic R knee pain, needs R TKR,  Pt still smoking, Cardiac stress test ok, mild Carotid dz,  Pt still smokes 2 cigs per day. Notes mild amount of mucus, whitish clear, thick and AM.   Review of Systems Constitutional:   No  weight loss, night sweats,  Fevers, chills,  +fatigue, or  lassitude.  HEENT:   No headaches,  Difficulty swallowing,  Tooth/dental problems, or  Sore throat,                No sneezing, itching, ear ache, + nasal congestion, post nasal drip,   CV:  No chest pain,  Orthopnea, PND, swelling in lower extremities, anasarca, dizziness, palpitations, syncope.   GI  No heartburn, indigestion, abdominal pain, nausea, vomiting, diarrhea, change in bowel habits, loss of appetite, bloody stools.   Resp:   No chest wall deformity  Skin: no rash or lesions.  GU: no dysuria, change in color of urine, no urgency or frequency.  No flank pain, no hematuria   MS:  +++ knee  joint pain ., no swelling.  No decreased range of motion.  No back pain.  Psych:  No change in mood or affect. No depression or anxiety.  No memory loss.     Physical Exam     Review of Systems    Objective:   Physical Exam Filed Vitals:   06/28/14 0934  BP: 122/68  Pulse: 82  Temp: 97.8 F (36.6 C)  TempSrc: Oral  Height: 5' 6.5" (1.689 m)  Weight: 152 lb (68.947 kg)  SpO2: 93%    Gen: Pleasant, well-nourished, in no distress,  normal affect  ENT: No lesions,  mouth clear,   oropharynx clear, no postnasal drip  Neck: No JVD, no TMG, no carotid bruits  Lungs: No use of accessory muscles, no dullness to percussion, no wheeze  Cardiovascular: RRR, heart sounds normal, no murmur or gallops, no peripheral edema  Abdomen: soft and NT, no HSM,  BS normal  Musculoskeletal: No deformities, no cyanosis or clubbing  Neuro: alert, non focal  Skin: Warm, no lesions or rashes  No results found.        Assessment & Plan:   Obstructive chronic bronchitis without exacerbation COPD gold stage C. Still active smoking Copd gold C Pt wants R TKR Plan Stop advair Start Breo one puff daily Stop smoking, use nicotine lozenges 4mg and use 2-3 per day No other changes A lung function test will be ordered  Return 3 months     Updated Medication List Outpatient Encounter Prescriptions as of 06/28/2014  Medication Sig  . albuterol (PROVENTIL HFA;VENTOLIN HFA) 108 (90 BASE) MCG/ACT inhaler Inhale 2 puffs into the lungs every 6 (six) hours as needed for wheezing or shortness of breath. Only dispense Ventolin  . albuterol (PROVENTIL) (2.5 MG/3ML) 0.083% nebulizer solution Inhale one vial via nebulizer four times daily as needed  .   aspirin EC 81 MG tablet Take 1 tablet (81 mg total) by mouth daily.  . Blood Glucose Monitoring Suppl (ONE TOUCH ULTRA SYSTEM KIT) W/DEVICE KIT 1 kit by Does not apply route once.  . Cholecalciferol (VITAMIN D3) 2000 UNITS TABS Take by mouth daily.   . diazepam (VALIUM) 5 MG tablet Take 1 tablet (5 mg total) by mouth daily as needed for anxiety or muscle spasms. 1 daily as needed  . furosemide (LASIX) 20 MG tablet Take 1 tablet (20 mg total) by mouth daily.  . lovastatin (MEVACOR) 20 MG tablet Take 1 tablet (20 mg total) by mouth at bedtime. 20 mg 1 by mouth daily  . metFORMIN (GLUCOPHAGE) 500 MG tablet Take 500 mg by mouth 2 (two) times daily with a meal.  . omeprazole (PRILOSEC OTC) 20 MG tablet Take 20 mg by mouth daily.    . ONE TOUCH  ULTRA TEST test strip by Other route as needed.   . Respiratory Therapy Supplies (FLUTTER) DEVI Use 3-4 times daily as needed  . [DISCONTINUED] Fluticasone-Salmeterol (ADVAIR DISKUS) 250-50 MCG/DOSE AEPB INHALE ONE PUFF BY MOUTH TWICE DAILY  . Fluticasone Furoate-Vilanterol (BREO ELLIPTA) 100-25 MCG/INH AEPB Inhale 1 puff into the lungs daily.  . Fluticasone Furoate-Vilanterol (BREO ELLIPTA) 100-25 MCG/INH AEPB Inhale 1 puff into the lungs daily.  . [DISCONTINUED] cephALEXin (KEFLEX) 500 MG capsule Take 1 capsule (500 mg total) by mouth 3 (three) times daily. (Patient not taking: Reported on 06/28/2014)  . [DISCONTINUED] meloxicam (MOBIC) 7.5 MG tablet Take 1 tablet (7.5 mg total) by mouth daily. (Patient not taking: Reported on 06/28/2014)      

## 2014-07-02 ENCOUNTER — Encounter: Payer: Self-pay | Admitting: Family Medicine

## 2014-07-02 ENCOUNTER — Ambulatory Visit (INDEPENDENT_AMBULATORY_CARE_PROVIDER_SITE_OTHER): Payer: Medicare HMO | Admitting: Family Medicine

## 2014-07-02 VITALS — BP 99/43 | HR 80 | Temp 97.7°F | Ht 67.0 in | Wt 154.4 lb

## 2014-07-02 DIAGNOSIS — K219 Gastro-esophageal reflux disease without esophagitis: Secondary | ICD-10-CM

## 2014-07-02 DIAGNOSIS — R634 Abnormal weight loss: Secondary | ICD-10-CM

## 2014-07-02 DIAGNOSIS — C50911 Malignant neoplasm of unspecified site of right female breast: Secondary | ICD-10-CM

## 2014-07-02 DIAGNOSIS — E049 Nontoxic goiter, unspecified: Secondary | ICD-10-CM

## 2014-07-02 DIAGNOSIS — E1159 Type 2 diabetes mellitus with other circulatory complications: Secondary | ICD-10-CM

## 2014-07-02 DIAGNOSIS — G4700 Insomnia, unspecified: Secondary | ICD-10-CM

## 2014-07-02 DIAGNOSIS — Z720 Tobacco use: Secondary | ICD-10-CM

## 2014-07-02 DIAGNOSIS — E785 Hyperlipidemia, unspecified: Secondary | ICD-10-CM

## 2014-07-02 NOTE — Progress Notes (Signed)
Pre visit review using our clinic review tool, if applicable. No additional management support is needed unless otherwise documented below in the visit note. 

## 2014-07-02 NOTE — Patient Instructions (Addendum)
Encouraged good sleep hygiene such as dark, quiet room. No blue/green glowing lights such as computer screens in bedroom. No alcohol or stimulants in evening. Cut down on caffeine as able. Regular exercise is helpful but not just prior to bed time. Try Melatonin 2 to 5 mg 1/2 prior to bedtime   Call if scab does not resolve in next 2 weeks   Insomnia Insomnia is frequent trouble falling and/or staying asleep. Insomnia can be a long term problem or a short term problem. Both are common. Insomnia can be a short term problem when the wakefulness is related to a certain stress or worry. Long term insomnia is often related to ongoing stress during waking hours and/or poor sleeping habits. Overtime, sleep deprivation itself can make the problem worse. Every little thing feels more severe because you are overtired and your ability to cope is decreased. CAUSES   Stress, anxiety, and depression.  Poor sleeping habits.  Distractions such as TV in the bedroom.  Naps close to bedtime.  Engaging in emotionally charged conversations before bed.  Technical reading before sleep.  Alcohol and other sedatives. They may make the problem worse. They can hurt normal sleep patterns and normal dream activity.  Stimulants such as caffeine for several hours prior to bedtime.  Pain syndromes and shortness of breath can cause insomnia.  Exercise late at night.  Changing time zones may cause sleeping problems (jet lag). It is sometimes helpful to have someone observe your sleeping patterns. They should look for periods of not breathing during the night (sleep apnea). They should also look to see how long those periods last. If you live alone or observers are uncertain, you can also be observed at a sleep clinic where your sleep patterns will be professionally monitored. Sleep apnea requires a checkup and treatment. Give your caregivers your medical history. Give your caregivers observations your family has made  about your sleep.  SYMPTOMS   Not feeling rested in the morning.  Anxiety and restlessness at bedtime.  Difficulty falling and staying asleep. TREATMENT   Your caregiver may prescribe treatment for an underlying medical disorders. Your caregiver can give advice or help if you are using alcohol or other drugs for self-medication. Treatment of underlying problems will usually eliminate insomnia problems.  Medications can be prescribed for short time use. They are generally not recommended for lengthy use.  Over-the-counter sleep medicines are not recommended for lengthy use. They can be habit forming.  You can promote easier sleeping by making lifestyle changes such as:  Using relaxation techniques that help with breathing and reduce muscle tension.  Exercising earlier in the day.  Changing your diet and the time of your last meal. No night time snacks.  Establish a regular time to go to bed.  Counseling can help with stressful problems and worry.  Soothing music and white noise may be helpful if there are background noises you cannot remove.  Stop tedious detailed work at least one hour before bedtime. HOME CARE INSTRUCTIONS   Keep a diary. Inform your caregiver about your progress. This includes any medication side effects. See your caregiver regularly. Take note of:  Times when you are asleep.  Times when you are awake during the night.  The quality of your sleep.  How you feel the next day. This information will help your caregiver care for you.  Get out of bed if you are still awake after 15 minutes. Read or do some quiet activity. Keep the lights down.  Wait until you feel sleepy and go back to bed.  Keep regular sleeping and waking hours. Avoid naps.  Exercise regularly.  Avoid distractions at bedtime. Distractions include watching television or engaging in any intense or detailed activity like attempting to balance the household checkbook.  Develop a bedtime  ritual. Keep a familiar routine of bathing, brushing your teeth, climbing into bed at the same time each night, listening to soothing music. Routines increase the success of falling to sleep faster.  Use relaxation techniques. This can be using breathing and muscle tension release routines. It can also include visualizing peaceful scenes. You can also help control troubling or intruding thoughts by keeping your mind occupied with boring or repetitive thoughts like the old concept of counting sheep. You can make it more creative like imagining planting one beautiful flower after another in your backyard garden.  During your day, work to eliminate stress. When this is not possible use some of the previous suggestions to help reduce the anxiety that accompanies stressful situations. MAKE SURE YOU:   Understand these instructions.  Will watch your condition.  Will get help right away if you are not doing well or get worse. Document Released: 07/10/2000 Document Revised: 10/05/2011 Document Reviewed: 08/10/2007 [facility] Patient Information 2015 [place], [place]. This information is not intended to replace advice given to you by your health care provider. Make sure you discuss any questions you have with your health care provider.

## 2014-07-04 ENCOUNTER — Telehealth: Payer: Self-pay | Admitting: Neurology

## 2014-07-04 NOTE — Telephone Encounter (Signed)
Pt canceled her appt to see Dr Delice Lesch on 07-19-14

## 2014-07-05 ENCOUNTER — Telehealth: Payer: Self-pay | Admitting: Critical Care Medicine

## 2014-07-05 ENCOUNTER — Encounter: Payer: Self-pay | Admitting: Adult Health

## 2014-07-05 ENCOUNTER — Ambulatory Visit (INDEPENDENT_AMBULATORY_CARE_PROVIDER_SITE_OTHER): Payer: Medicare HMO | Admitting: Adult Health

## 2014-07-05 VITALS — BP 120/74 | HR 87 | Temp 98.1°F | Wt 151.0 lb

## 2014-07-05 DIAGNOSIS — J449 Chronic obstructive pulmonary disease, unspecified: Secondary | ICD-10-CM

## 2014-07-05 MED ORDER — FLUTICASONE FUROATE-VILANTEROL 100-25 MCG/INH IN AEPB
1.0000 | INHALATION_SPRAY | Freq: Every day | RESPIRATORY_TRACT | Status: DC
Start: 2014-07-05 — End: 2014-07-24

## 2014-07-05 NOTE — Progress Notes (Signed)
Subjective:    [name], female    DOB: March 08, 1941, 73 y.o.   MRN: [id number]  HPI   Subjective:    [name], female    DOB: [birth year], 73 y.o.   MRN: [id number]  HPI 73 yo WF with Copd-active smoker Moved from [facility] 2008, saw Lung MD there.  Dx Copd many years.(2004) Still smokes     06/28/2014 Chief Complaint  Patient presents with  . Follow-up    f/u COPD; breathing little better except in mornings head dries up overnight, nasal passage gets "crusty", dried out; mouth gets dry; coughing up some white thick mucus  Notes is losing weight, 186>>152 in 3 yrs. Appt is fair, notes some chronic R knee pain, needs R TKR,  Pt still smoking, Cardiac stress test ok, mild Carotid dz,  Pt still smokes 2 cigs per day. Notes mild amount of mucus, whitish clear, thick and AM.  >changed Advair to BREO   07/05/2014 Acute OV  Presents for an acute office visit.  Reports having having heartburn and wheezing this am for little while after taking BREO on empty stomach Was seen last week changed from Advair to [facility] .  Says she likes it feels it is better than advair  But got more reflux this am when she did not eat breakfast and took medicine. Once she ate, felt much better.  Had stress test on 12/1/ that was neg for ishemia   She denies any exertional chest pain, palpitations, presyncope, syncope, hemoptysis, fever, orthopnea, PND or leg swelling Encouraged on smoking cessation     Review of Systems Constitutional:   No  weight loss, night sweats,  Fevers, chills,  +fatigue, or  lassitude.  HEENT:   No headaches,  Difficulty swallowing,  Tooth/dental problems, or  Sore throat,                No sneezing, itching, ear ache, + nasal congestion, post nasal drip,   CV:  No chest pain,  Orthopnea, PND, swelling in lower extremities, anasarca, dizziness, palpitations, syncope.   GI  No heartburn, indigestion, abdominal pain, nausea, vomiting,  diarrhea, change in bowel habits, loss of appetite, bloody stools.   Resp:   No chest wall deformity  Skin: no rash or lesions.  GU: no dysuria, change in color of urine, no urgency or frequency.  No flank pain, no hematuria   MS:  +++ knee  joint pain ., no swelling.  No decreased range of motion.  No back pain.  Psych:  No change in mood or affect. No depression or anxiety.  No memory loss.     Physical Exam     Review of Systems    Objective:   Physical Exam Filed Vitals:   07/05/14 1458 07/05/14 1500  BP:  120/74  Pulse:  87  Temp:  98.1 F (36.7 C)  TempSrc:  Oral  Weight: 151 lb (68.493 kg)   SpO2:  91%    Gen: Pleasant, elderly , chronically ill appearing  in no distress,  normal affect  ENT: No lesions,  mouth clear,  oropharynx clear, no postnasal drip  Neck: No JVD, no TMG, no carotid bruits  Lungs: No use of accessory muscles, no dullness to percussion, decreased BS in bases , scant exp wheeze   Cardiovascular: RRR, heart sounds normal, no murmur or gallops, no peripheral edema  Abdomen: soft and NT, no HSM,  BS normal  Musculoskeletal: No deformities,  no cyanosis or clubbing  Neuro: alert, non focal  Skin: Warm, no lesions or rashes  No results found.        Assessment & Plan:   No problem-specific assessment & plan notes found for this encounter.   Updated Medication List Outpatient Encounter Prescriptions as of 07/05/2014  Medication Sig  . albuterol (PROVENTIL HFA;VENTOLIN HFA) 108 (90 BASE) MCG/ACT inhaler Inhale 2 puffs into the lungs every 6 (six) hours as needed for wheezing or shortness of breath. Only dispense Ventolin  . albuterol (PROVENTIL) (2.5 MG/3ML) 0.083% nebulizer solution Inhale one vial via nebulizer four times daily as needed  . aspirin EC 81 MG tablet Take 1 tablet (81 mg total) by mouth daily.  . Blood Glucose Monitoring Suppl (ONE TOUCH ULTRA SYSTEM KIT) W/DEVICE KIT 1 kit by Does not apply route once.  .  Cholecalciferol (VITAMIN D3) 2000 UNITS TABS Take by mouth daily.   . diazepam (VALIUM) 5 MG tablet Take 1 tablet (5 mg total) by mouth daily as needed for anxiety or muscle spasms. 1 daily as needed  . Fluticasone Furoate-Vilanterol (BREO ELLIPTA) 100-25 MCG/INH AEPB Inhale 1 puff into the lungs daily.  . furosemide (LASIX) 20 MG tablet Take 1 tablet (20 mg total) by mouth daily.  Marland Kitchen lovastatin (MEVACOR) 20 MG tablet Take 1 tablet (20 mg total) by mouth at bedtime. 20 mg 1 by mouth daily  . metFORMIN (GLUCOPHAGE) 500 MG tablet Take 500 mg by mouth 2 (two) times daily with a meal.  . omeprazole (PRILOSEC OTC) 20 MG tablet Take 20 mg by mouth daily.    . ONE TOUCH ULTRA TEST test strip by Other route as needed.   Marland Kitchen Respiratory Therapy Supplies (FLUTTER) DEVI Use 3-4 times daily as needed

## 2014-07-05 NOTE — Telephone Encounter (Signed)
Spoke with pt and advised of Dr Bettina Gavia recommendations.  PT given appt with  TP.

## 2014-07-05 NOTE — Telephone Encounter (Signed)
Stop breo for now  Will need OV w/in with tammy parrett

## 2014-07-05 NOTE — Assessment & Plan Note (Signed)
?   Medication side effect vs GERD flare  Recent cardiac stress test neg ,  Sx relieved with eating suspect GI related   Plan  Continue on  Breo one puff daily If continue to feel bad with BREO can change back to Advair .  Stop smoking, use nicotine lozenges 4mg  and use 2-3 per day GERD diet  Do not skip meals, eat small more frequent meals.  Follow up Dr. Joya Gaskins  In 3 months with PFT as planned  Please contact office for sooner follow up if symptoms do not improve or worsen or seek emergency care

## 2014-07-05 NOTE — Patient Instructions (Addendum)
Continue on  Breo one puff daily If continue to feel bad with BREO can change back to Advair .  Stop smoking, use nicotine lozenges 4mg  and use 2-3 per day GERD diet  Do not skip meals, eat small more frequent meals.  Follow up Dr. Joya Gaskins  In 3 months with PFT as planned  Please contact office for sooner follow up if symptoms do not improve or worsen or seek emergency care

## 2014-07-05 NOTE — Addendum Note (Signed)
Addended by: Virl Cagey on: 07/05/2014 03:46 PM   Modules accepted: Orders

## 2014-07-05 NOTE — Telephone Encounter (Signed)
PT states that she has been on Breo for 6 days without any problems.  But after taking it this morning she c/o upper chest pain up into throat area.  Lasted off and on for about 2 hours.  Denies sob or n and v.  Pt states she has never had these symptoms before.  Please advise.

## 2014-07-08 ENCOUNTER — Encounter: Payer: Self-pay | Admitting: Family Medicine

## 2014-07-08 NOTE — Assessment & Plan Note (Signed)
Encouraged complete cessation. Discussed need to quit as relates to risk of numerous cancers, cardiac and pulmonary disease as well as neurologic complications. Counseled for greater than 3 minutes 

## 2014-07-08 NOTE — Assessment & Plan Note (Signed)
Avoid offending foods, start probiotics. Do not eat large meals in late evening and consider raising head of bed.  

## 2014-07-08 NOTE — Progress Notes (Signed)
Ashley Savage  [id number] 1940-09-24 07/08/2014      Progress Note-Follow Up  Subjective  Chief Complaint  Chief Complaint  Patient presents with  . thyroid gland    found by cardiologist    HPI  Patient is a 73 y.o. female in today for routine medical care. Patient is in today for follow-up. Overall doing well. Unfortunately continues to smoke. Has significant congestion postnasal drip and sputum production. No fevers or chills but is struggling with fatigue. Blood sugars have ranged from 90-190. Denies polyuria or polydipsia. Has had some increased constipation with no bloody or tarry stool. Is having trouble sleeping. Patient encouraged to maintain heart healthy diet, regular exercise, adequate sleep. Consider daily probiotics. Take medications as prescribed  Past Medical History  Diagnosis Date  . Emphysema   . Diabetes mellitus type 2  . Hyperlipidemia   . Cancer breast ca  right  . Anxiety   . Panic attacks   . Depression   . Arthritis of both knees 10/01/2013  . Benign paroxysmal positional vertigo 10/01/2013  . Neck pain 10/01/2013  . Esophageal reflux 10/01/2013  . Pedal edema 12/10/2013  . Overactive bladder 12/10/2013  . Tobacco abuse disorder 02/01/2014  . COPD (chronic obstructive pulmonary disease) 10/01/2013    Past Surgical History  Procedure Laterality Date  . Gallbladder surgery  1992  . Breast surgery  2009 right  . Appendectomy  2007  . Knee surgery    . Mandible fracture surgery    . Pilonidal cyst excision    . Tonsillectomy      Family History  Problem Relation Age of Onset  . Heart failure Father   . COPD Father   . Arthritis Father 10  . Pneumonia Sister   . Breast cancer    . Stroke Mother   . Arthritis Mother 84  . Hyperlipidemia Mother   . Hypertension Mother   . Diabetes Mother   . Breast cancer Maternal Aunt   . Alcohol abuse Maternal Uncle     History   Social History  . Marital Status: Single    Spouse Name: N/A    Number of  Children: 0  . Years of Education: N/A   Occupational History  . retire    Social History Main Topics  . Smoking status: Current Some Day Smoker -- 0.05 packs/day    Types: Cigarettes    Start date: 07/27/1964  . Smokeless tobacco: Never Used     Comment: still smoking  03-28-14  . Alcohol Use: No  . Drug Use: No  . Sexual Activity: Yes    Birth Control/ Protection: Post-menopausal   Other Topics Concern  . Not on file   Social History Narrative    Current Outpatient Prescriptions on File Prior to Visit  Medication Sig Dispense Refill  . albuterol (PROVENTIL HFA;VENTOLIN HFA) 108 (90 BASE) MCG/ACT inhaler Inhale 2 puffs into the lungs every 6 (six) hours as needed for wheezing or shortness of breath. Only dispense Ventolin 1 Inhaler 3  . albuterol (PROVENTIL) (2.5 MG/3ML) 0.083% nebulizer solution Inhale one vial via nebulizer four times daily as needed 375 mL 4  . aspirin EC 81 MG tablet Take 1 tablet (81 mg total) by mouth daily. 90 tablet 3  . Blood Glucose Monitoring Suppl (ONE TOUCH ULTRA SYSTEM KIT) W/DEVICE KIT 1 kit by Does not apply route once.    . Cholecalciferol (VITAMIN D3) 2000 UNITS TABS Take by mouth daily.     Marland Kitchen  diazepam (VALIUM) 5 MG tablet Take 1 tablet (5 mg total) by mouth daily as needed for anxiety or muscle spasms. 1 daily as needed 30 tablet 3  . furosemide (LASIX) 20 MG tablet Take 1 tablet (20 mg total) by mouth daily. 30 tablet 3  . lovastatin (MEVACOR) 20 MG tablet Take 1 tablet (20 mg total) by mouth at bedtime. 20 mg 1 by mouth daily 90 tablet 1  . metFORMIN (GLUCOPHAGE) 500 MG tablet Take 500 mg by mouth 2 (two) times daily with a meal.    . omeprazole (PRILOSEC OTC) 20 MG tablet Take 20 mg by mouth daily.      . ONE TOUCH ULTRA TEST test strip by Other route as needed.     Marland Kitchen Respiratory Therapy Supplies (FLUTTER) DEVI Use 3-4 times daily as needed     No current facility-administered medications on file prior to visit.    Allergies  Allergen  Reactions  . Citalopram     Confusion, irregular heart beat.  . Erythromycin     Stomach cramps    Review of Systems  Review of Systems  Constitutional: Positive for malaise/fatigue. Negative for fever.  HENT: Negative for congestion.   Eyes: Negative for discharge.  Respiratory: Positive for cough and sputum production. Negative for shortness of breath.   Cardiovascular: Negative for chest pain, palpitations and leg swelling.  Gastrointestinal: Negative for nausea, abdominal pain and diarrhea.  Genitourinary: Negative for dysuria.  Musculoskeletal: Negative for falls.  Skin: Negative for rash.  Neurological: Negative for loss of consciousness and headaches.  Endo/Heme/Allergies: Negative for polydipsia.  Psychiatric/Behavioral: Negative for depression and suicidal ideas. The patient is not nervous/anxious and does not have insomnia.     Objective  BP 99/43 mmHg  Pulse 80  Temp(Src) 97.7 F (36.5 C) (Oral)  Ht '5\' 7"'  (1.702 m)  Wt 154 lb 6.4 oz (70.035 kg)  BMI 24.18 kg/m2  SpO2 94%  Physical Exam  Physical Exam  Constitutional: She is oriented to person, place, and time and well-developed, well-nourished, and in no distress. No distress.  HENT:  Head: Normocephalic and atraumatic.  Eyes: Conjunctivae are normal.  Neck: Neck supple. No thyromegaly present.  Cardiovascular: Normal rate, regular rhythm and normal heart sounds.   No murmur heard. Pulmonary/Chest: Effort normal and breath sounds normal. She has no wheezes.  Abdominal: She exhibits no distension and no mass.  Musculoskeletal: She exhibits no edema.  Lymphadenopathy:    She has no cervical adenopathy.  Neurological: She is alert and oriented to person, place, and time.  Skin: Skin is warm and dry. No rash noted. She is not diaphoretic.  Psychiatric: Memory, affect and judgment normal.    Lab Results  Component Value Date   TSH 1.158 12/08/2013   Lab Results  Component Value Date   WBC 6.1  05/31/2014   HGB 13.8 05/31/2014   HCT 40.1 05/31/2014   MCV 91.1 05/31/2014   PLT 112* 05/31/2014   Lab Results  Component Value Date   CREATININE 0.64 06/22/2014   BUN 11 06/22/2014   NA 139 06/22/2014   K 3.7 06/22/2014   CL 99 06/22/2014   CO2 30 06/22/2014   Lab Results  Component Value Date   ALT 20 05/31/2014   AST 30 05/31/2014   ALKPHOS 114 05/31/2014   BILITOT 1.2 05/31/2014   Lab Results  Component Value Date   CHOL 167 04/12/2014   Lab Results  Component Value Date   HDL 52.30 04/12/2014  Lab Results  Component Value Date   LDLCALC 98 04/12/2014   Lab Results  Component Value Date   TRIG 83.0 04/12/2014   Lab Results  Component Value Date   CHOLHDL 3 04/12/2014     Assessment & Plan  Esophageal reflux Avoid offending foods, start probiotics. Do not eat large meals in late evening and consider raising head of bed.   Diabetes mellitus hgba1c acceptable, minimize simple carbs. Increase exercise as tolerated. Continue current meds  Hyperlipidemia Tolerating statin, encouraged heart healthy diet, avoid trans fats, minimize simple carbs and saturated fats. Increase exercise as tolerated  Tobacco abuse disorder Encouraged complete cessation. Discussed need to quit as relates to risk of numerous cancers, cardiac and pulmonary disease as well as neurologic complications. Counseled for greater than 3 minutes  Breast cancer Following with oncology, Dr Marin Olp.

## 2014-07-08 NOTE — Assessment & Plan Note (Signed)
Tolerating statin, encouraged heart healthy diet, avoid trans fats, minimize simple carbs and saturated fats. Increase exercise as tolerated 

## 2014-07-08 NOTE — Assessment & Plan Note (Signed)
Following with oncology, Dr Marin Olp.

## 2014-07-08 NOTE — Assessment & Plan Note (Signed)
hgba1c acceptable, minimize simple carbs. Increase exercise as tolerated. Continue current meds 

## 2014-07-09 ENCOUNTER — Ambulatory Visit: Payer: Medicare HMO | Admitting: Physician Assistant

## 2014-07-16 ENCOUNTER — Ambulatory Visit (HOSPITAL_BASED_OUTPATIENT_CLINIC_OR_DEPARTMENT_OTHER)
Admission: RE | Admit: 2014-07-16 | Discharge: 2014-07-16 | Disposition: A | Discharge status: Home / Self Care | Payer: Medicare HMO | Source: Ambulatory Visit | Attending: Family Medicine | Admitting: Family Medicine

## 2014-07-16 ENCOUNTER — Ambulatory Visit (HOSPITAL_BASED_OUTPATIENT_CLINIC_OR_DEPARTMENT_OTHER)
Admission: RE | Admit: 2014-07-16 | Discharge: 2014-07-16 | Disposition: A | Discharge status: Home / Self Care | Payer: Medicare HMO | Source: Ambulatory Visit | Attending: Hematology & Oncology | Admitting: Hematology & Oncology

## 2014-07-16 DIAGNOSIS — E049 Nontoxic goiter, unspecified: Secondary | ICD-10-CM

## 2014-07-16 DIAGNOSIS — Z1231 Encounter for screening mammogram for malignant neoplasm of breast: Secondary | ICD-10-CM | POA: Diagnosis present

## 2014-07-16 DIAGNOSIS — E041 Nontoxic single thyroid nodule: Secondary | ICD-10-CM | POA: Insufficient documentation

## 2014-07-16 DIAGNOSIS — R634 Abnormal weight loss: Secondary | ICD-10-CM

## 2014-07-16 DIAGNOSIS — G4700 Insomnia, unspecified: Secondary | ICD-10-CM

## 2014-07-19 ENCOUNTER — Ambulatory Visit: Payer: Medicare HMO | Admitting: Neurology

## 2014-07-24 ENCOUNTER — Ambulatory Visit (INDEPENDENT_AMBULATORY_CARE_PROVIDER_SITE_OTHER): Payer: Medicare HMO | Admitting: Family Medicine

## 2014-07-24 ENCOUNTER — Encounter: Payer: Self-pay | Admitting: Family Medicine

## 2014-07-24 VITALS — BP 136/50 | HR 88 | Temp 97.9°F | Ht 67.0 in | Wt 150.2 lb

## 2014-07-24 DIAGNOSIS — H8110 Benign paroxysmal vertigo, unspecified ear: Secondary | ICD-10-CM

## 2014-07-24 DIAGNOSIS — IMO0001 Reserved for inherently not codable concepts without codable children: Secondary | ICD-10-CM

## 2014-07-24 DIAGNOSIS — C50911 Malignant neoplasm of unspecified site of right female breast: Secondary | ICD-10-CM

## 2014-07-24 DIAGNOSIS — R4182 Altered mental status, unspecified: Secondary | ICD-10-CM

## 2014-07-24 DIAGNOSIS — J4489 Other specified chronic obstructive pulmonary disease: Secondary | ICD-10-CM

## 2014-07-24 DIAGNOSIS — Z720 Tobacco use: Secondary | ICD-10-CM

## 2014-07-24 DIAGNOSIS — R030 Elevated blood-pressure reading, without diagnosis of hypertension: Secondary | ICD-10-CM

## 2014-07-24 DIAGNOSIS — E782 Mixed hyperlipidemia: Secondary | ICD-10-CM

## 2014-07-24 DIAGNOSIS — K219 Gastro-esophageal reflux disease without esophagitis: Secondary | ICD-10-CM

## 2014-07-24 DIAGNOSIS — E119 Type 2 diabetes mellitus without complications: Secondary | ICD-10-CM

## 2014-07-24 DIAGNOSIS — E1159 Type 2 diabetes mellitus with other circulatory complications: Secondary | ICD-10-CM

## 2014-07-24 DIAGNOSIS — J449 Chronic obstructive pulmonary disease, unspecified: Secondary | ICD-10-CM

## 2014-07-24 NOTE — Assessment & Plan Note (Signed)
Encouraged complete cessation. Discussed need to quit as relates to risk of numerous cancers, cardiac and pulmonary disease as well as neurologic complications. Counseled for greater than 3 minutes 

## 2014-07-24 NOTE — Assessment & Plan Note (Signed)
Has been struggling with erythema, discomfort and discharge from right breast scar, is now improving with alcohol wipes and applying Mupirocin ointment, try 2 more weeks and call if worsen

## 2014-07-24 NOTE — Assessment & Plan Note (Signed)
Struggled after recent cataract surgery but is better now.

## 2014-07-24 NOTE — Progress Notes (Signed)
Pre visit review using our clinic review tool, if applicable. No additional management support is needed unless otherwise documented below in the visit note. 

## 2014-07-24 NOTE — Progress Notes (Signed)
Ashley Savage  [id number] 10/04/1940 07/24/2014      Progress Note-Follow Up  Subjective  Chief Complaint  Chief Complaint  Patient presents with  . Follow-up    3 weeks-pt states the area is still draining    HPI  Patient is a 73 y.o. female in today for routine medical care.  She is in today for follow-up. Is noting her mastectomy scar is improving. The scabbing is nearly better. No fevers or chills. Is having intermittent trouble with vertigo and neck pain and paresthesias. No recent injury or illness. Denies CP/palp/SOB/congestion/fevers/GI or GU c/o. Taking meds as prescribed  Past Medical History  Diagnosis Date  . Emphysema   . Diabetes mellitus type 2  . Hyperlipidemia   . Cancer breast ca  right  . Anxiety   . Panic attacks   . Depression   . Arthritis of both knees 10/01/2013  . Benign paroxysmal positional vertigo 10/01/2013  . Neck pain 10/01/2013  . Esophageal reflux 10/01/2013  . Pedal edema 12/10/2013  . Overactive bladder 12/10/2013  . Tobacco abuse disorder 02/01/2014  . COPD (chronic obstructive pulmonary disease) 10/01/2013    Past Surgical History  Procedure Laterality Date  . Gallbladder surgery  1992  . Breast surgery  2009 right  . Appendectomy  2007  . Knee surgery    . Mandible fracture surgery    . Pilonidal cyst excision    . Tonsillectomy    . Cataract extraction      Family History  Problem Relation Age of Onset  . Heart failure Father   . COPD Father   . Arthritis Father 42  . Pneumonia Sister   . Breast cancer    . Stroke Mother   . Arthritis Mother 44  . Hyperlipidemia Mother   . Hypertension Mother   . Diabetes Mother   . Breast cancer Maternal Aunt   . Alcohol abuse Maternal Uncle     History   Social History  . Marital Status: Single    Spouse Name: N/A    Number of Children: 0  . Years of Education: N/A   Occupational History  . retire    Social History Main Topics  . Smoking status: Current Some Day Smoker -- 0.05  packs/day    Types: Cigarettes    Start date: 07/27/1964  . Smokeless tobacco: Never Used     Comment: still smoking  03-28-14  . Alcohol Use: No  . Drug Use: No  . Sexual Activity: Yes    Birth Control/ Protection: Post-menopausal   Other Topics Concern  . Not on file   Social History Narrative    Current Outpatient Prescriptions on File Prior to Visit  Medication Sig Dispense Refill  . albuterol (PROVENTIL HFA;VENTOLIN HFA) 108 (90 BASE) MCG/ACT inhaler Inhale 2 puffs into the lungs every 6 (six) hours as needed for wheezing or shortness of breath. Only dispense Ventolin 1 Inhaler 3  . albuterol (PROVENTIL) (2.5 MG/3ML) 0.083% nebulizer solution Inhale one vial via nebulizer four times daily as needed 375 mL 4  . aspirin EC 81 MG tablet Take 1 tablet (81 mg total) by mouth daily. 90 tablet 3  . Blood Glucose Monitoring Suppl (ONE TOUCH ULTRA SYSTEM KIT) W/DEVICE KIT 1 kit by Does not apply route once.    . Cholecalciferol (VITAMIN D3) 2000 UNITS TABS Take by mouth daily.     . diazepam (VALIUM) 5 MG tablet Take 1 tablet (5 mg total) by mouth daily as  needed for anxiety or muscle spasms. 1 daily as needed 30 tablet 3  . furosemide (LASIX) 20 MG tablet Take 1 tablet (20 mg total) by mouth daily. 30 tablet 3  . lovastatin (MEVACOR) 20 MG tablet Take 1 tablet (20 mg total) by mouth at bedtime. 20 mg 1 by mouth daily 90 tablet 1  . metFORMIN (GLUCOPHAGE) 500 MG tablet Take 500 mg by mouth 2 (two) times daily with a meal.    . omeprazole (PRILOSEC OTC) 20 MG tablet Take 20 mg by mouth daily.      . ONE TOUCH ULTRA TEST test strip by Other route as needed.     Marland Kitchen Respiratory Therapy Supplies (FLUTTER) DEVI Use 3-4 times daily as needed     No current facility-administered medications on file prior to visit.    Allergies  Allergen Reactions  . Citalopram     Confusion, irregular heart beat.  . Erythromycin     Stomach cramps    Review of Systems  Review of Systems  Constitutional:  Negative for fever and malaise/fatigue.  HENT: Negative for congestion.   Eyes: Negative for blurred vision, double vision, photophobia, pain, discharge and redness.  Respiratory: Negative for shortness of breath.   Cardiovascular: Negative for chest pain, palpitations and leg swelling.  Gastrointestinal: Negative for nausea, abdominal pain and diarrhea.  Genitourinary: Negative for dysuria.  Musculoskeletal: Positive for neck pain. Negative for falls.  Skin: Negative for rash.  Neurological: Positive for tingling and headaches. Negative for loss of consciousness.  Endo/Heme/Allergies: Negative for polydipsia.  Psychiatric/Behavioral: Negative for depression and suicidal ideas. The patient is not nervous/anxious and does not have insomnia.     Objective  BP 136/50 mmHg  Pulse 88  Temp(Src) 97.9 F (36.6 C) (Oral)  Ht '5\' 7"'  (1.702 m)  Wt 150 lb 3.2 oz (68.13 kg)  BMI 23.52 kg/m2  SpO2 96%  Physical Exam  Physical Exam  Constitutional: She is oriented to person, place, and time and well-developed, well-nourished, and in no distress. No distress.  HENT:  Head: Normocephalic and atraumatic.  Right Ear: External ear normal.  Left Ear: External ear normal.  Nose: Nose normal.  Mouth/Throat: Oropharynx is clear and moist. No oropharyngeal exudate.  Eyes: Conjunctivae are normal. Pupils are equal, round, and reactive to light. Right eye exhibits no discharge. Left eye exhibits no discharge. No scleral icterus.  Neck: Normal range of motion. Neck supple. No thyromegaly present.  Cardiovascular: Normal rate, regular rhythm, normal heart sounds and intact distal pulses.   No murmur heard. Pulmonary/Chest: Effort normal and breath sounds normal. No respiratory distress. She has no wheezes. She has no rales.  Abdominal: Soft. Bowel sounds are normal. She exhibits no distension and no mass. There is no tenderness.  Musculoskeletal: Normal range of motion. She exhibits no edema or  tenderness.  Lymphadenopathy:    She has no cervical adenopathy.  Neurological: She is alert and oriented to person, place, and time. She has normal reflexes. No cranial nerve deficit. Coordination normal.  Skin: Skin is warm and dry. No rash noted. She is not diaphoretic.  Psychiatric: Mood, memory, affect and judgment normal.    Lab Results  Component Value Date   TSH 1.158 12/08/2013   Lab Results  Component Value Date   WBC 6.1 05/31/2014   HGB 13.8 05/31/2014   HCT 40.1 05/31/2014   MCV 91.1 05/31/2014   PLT 112* 05/31/2014   Lab Results  Component Value Date   CREATININE 0.64 06/22/2014  BUN 11 06/22/2014   NA 139 06/22/2014   K 3.7 06/22/2014   CL 99 06/22/2014   CO2 30 06/22/2014   Lab Results  Component Value Date   ALT 20 05/31/2014   AST 30 05/31/2014   ALKPHOS 114 05/31/2014   BILITOT 1.2 05/31/2014   Lab Results  Component Value Date   CHOL 167 04/12/2014   Lab Results  Component Value Date   HDL 52.30 04/12/2014   Lab Results  Component Value Date   LDLCALC 98 04/12/2014   Lab Results  Component Value Date   TRIG 83.0 04/12/2014   Lab Results  Component Value Date   CHOLHDL 3 04/12/2014     Assessment & Plan   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 Did not tolerate Breo caused burning and chest pain, doing better back on Advair  Diabetes mellitus hgba1c acceptable, minimize simple carbs. Increase exercise as tolerated. Continue current meds  Tobacco abuse disorder Encouraged complete cessation. Discussed need to quit as relates to risk of numerous cancers, cardiac and pulmonary disease as well as neurologic complications. Counseled for greater than 3 minutes  Altered mental status Struggled after recent cataract surgery but is better now.

## 2014-07-24 NOTE — Assessment & Plan Note (Signed)
hgba1c acceptable, minimize simple carbs. Increase exercise as tolerated. Continue current meds 

## 2014-07-24 NOTE — Assessment & Plan Note (Signed)
Did not tolerate Breo caused burning and chest pain, doing better back on Advair

## 2014-07-24 NOTE — Patient Instructions (Addendum)
Basic Carbohydrate Counting for Diabetes Mellitus Carbohydrate counting is a method for keeping track of the amount of carbohydrates you eat. Eating carbohydrates naturally increases the level of sugar (glucose) in your blood, so it is important for you to know the amount that is okay for you to have in every meal. Carbohydrate counting helps keep the level of glucose in your blood within normal limits. The amount of carbohydrates allowed is different for every person. A dietitian can help you calculate the amount that is right for you. Once you know the amount of carbohydrates you can have, you can count the carbohydrates in the foods you want to eat. Carbohydrates are found in the following foods:  Grains, such as breads and cereals.  Dried beans and soy products.  Starchy vegetables, such as potatoes, peas, and corn.  Fruit and fruit juices.  Milk and yogurt.  Sweets and snack foods, such as cake, cookies, candy, chips, soft drinks, and fruit drinks. CARBOHYDRATE COUNTING There are two ways to count the carbohydrates in your food. You can use either of the methods or a combination of both. Reading the "Nutrition Facts" on Packaged Food The "Nutrition Facts" is an area that is included on the labels of almost all packaged food and beverages in the [place]. It includes the serving size of that food or beverage and information about the nutrients in each serving of the food, including the grams (g) of carbohydrate per serving.  Decide the number of servings of this food or beverage that you will be able to eat or drink. Multiply that number of servings by the number of grams of carbohydrate that is listed on the label for that serving. The total will be the amount of carbohydrates you will be having when you eat or drink this food or beverage. Learning Standard Serving Sizes of Food When you eat food that is not packaged or does not include "Nutrition Facts" on the label, you need to  measure the servings in order to count the amount of carbohydrates.A serving of most carbohydrate-rich foods contains about 15 g of carbohydrates. The following list includes serving sizes of carbohydrate-rich foods that provide 15 g ofcarbohydrate per serving:   1 slice of bread (1 oz) or 1 six-inch tortilla.    of a hamburger bun or English muffin.  4-6 crackers.   cup unsweetened dry cereal.    cup hot cereal.   cup rice or pasta.    cup mashed potatoes or  of a large baked potato.  1 cup fresh fruit or one small piece of fruit.    cup canned or frozen fruit or fruit juice.  1 cup milk.   cup plain fat-free yogurt or yogurt sweetened with artificial sweeteners.   cup cooked dried beans or starchy vegetable, such as peas, corn, or potatoes.  Decide the number of standard-size servings that you will eat. Multiply that number of servings by 15 (the grams of carbohydrates in that serving). For example, if you eat 2 cups of strawberries, you will have eaten 2 servings and 30 g of carbohydrates (2 servings x 15 g = 30 g). For foods such as soups and casseroles, in which more than one food is mixed in, you will need to count the carbohydrates in each food that is included. EXAMPLE OF CARBOHYDRATE COUNTING Sample Dinner  3 oz chicken breast.   cup of brown rice.   cup of corn.  1 cup milk.   1 cup strawberries with   sugar-free whipped topping.  Carbohydrate Calculation Step 1: Identify the foods that contain carbohydrates:   Rice.   Corn.   Milk.   Strawberries. Step 2:Calculate the number of servings eaten of each:   2 servings of rice.   1 serving of corn.   1 serving of milk.   1 serving of strawberries. Step 3: Multiply each of those number of servings by 15 g:   2 servings of rice x 15 g = 30 g.   1 serving of corn x 15 g = 15 g.   1 serving of milk x 15 g = 15 g.   1 serving of strawberries x 15 g = 15 g. Step 4: Add  together all of the amounts to find the total grams of carbohydrates eaten: 30 g + 15 g + 15 g + 15 g = 75 g. Document Released: 07/13/2005 Document Revised: 11/27/2013 Document Reviewed: 06/09/2013 [facility] Patient Information 2015 [place], [place]. This information is not intended to replace advice given to you by your health care provider. Make sure you discuss any questions you have with your health care provider. Wound Infection A wound infection happens when a type of germ (bacteria) starts growing in the wound. In some cases, this can cause the wound to break open. If cared for properly, the infected wound will heal from the inside to the outside. Wound infections need treatment. CAUSES An infection is caused by bacteria growing in the wound.  SYMPTOMS   Increase in redness, swelling, or pain at the wound site.  Increase in drainage at the wound site.  Wound or bandage (dressing) starts to smell bad.  Fever.  Feeling tired or fatigued.  Pus draining from the wound. TREATMENT  Your health care provider will prescribe antibiotic medicine. The wound infection should improve within 24 to 48 hours. Any redness around the wound should stop spreading and the wound should be less painful.  HOME CARE INSTRUCTIONS   Only take over-the-counter or prescription medicines for pain, discomfort, or fever as directed by your health care provider.  Take your antibiotics as directed. Finish them even if you start to feel better.  Gently wash the area with mild soap and water 2 times a day, or as directed. Rinse off the soap. Pat the area dry with a clean towel. Do not rub the wound. This may cause bleeding.  Follow your health care provider's instructions for how often you need to change the dressing.  Apply ointment and a dressing to the wound as directed.  If the dressing sticks, moisten it with soapy water and gently remove it.  Change the bandage right away if it becomes wet, dirty, or  develops a bad smell.  Take showers. Do not take tub baths, swim, or do anything that may soak the wound until it is healed.  Avoid exercises that make you sweat heavily.  Use anti-itch medicine as directed by your health care provider. The wound may itch when it is healing. Do not pick or scratch at the wound.  Follow up with your health care provider to get your wound rechecked as directed. SEEK MEDICAL CARE IF:  You have an increase in swelling, pain, or redness around the wound.  You have an increase in the amount of pus coming from the wound.  There is a bad smell coming from the wound.  More of the wound breaks open.  You have a fever. MAKE SURE YOU:   Understand these instructions.  Will  watch your condition.  Will get help right away if you are not doing well or get worse. Document Released: 04/11/2003 Document Revised: 07/18/2013 Document Reviewed: 11/16/2010 [facility] Patient Information 2015 [place], [place]. This information is not intended to replace advice given to you by your health care provider. Make sure you discuss any questions you have with your health care provider.

## 2014-08-01 ENCOUNTER — Encounter: Payer: Self-pay | Admitting: Physician Assistant

## 2014-08-01 ENCOUNTER — Ambulatory Visit (INDEPENDENT_AMBULATORY_CARE_PROVIDER_SITE_OTHER): Payer: Medicare HMO | Admitting: Physician Assistant

## 2014-08-01 VITALS — BP 120/62 | HR 76 | Ht 67.0 in | Wt 148.8 lb

## 2014-08-01 DIAGNOSIS — I6523 Occlusion and stenosis of bilateral carotid arteries: Secondary | ICD-10-CM

## 2014-08-01 DIAGNOSIS — Z720 Tobacco use: Secondary | ICD-10-CM

## 2014-08-01 DIAGNOSIS — R600 Localized edema: Secondary | ICD-10-CM

## 2014-08-01 DIAGNOSIS — J42 Unspecified chronic bronchitis: Secondary | ICD-10-CM

## 2014-08-01 DIAGNOSIS — E079 Disorder of thyroid, unspecified: Secondary | ICD-10-CM

## 2014-08-01 DIAGNOSIS — R609 Edema, unspecified: Secondary | ICD-10-CM

## 2014-08-01 DIAGNOSIS — I10 Essential (primary) hypertension: Secondary | ICD-10-CM

## 2014-08-01 MED ORDER — FUROSEMIDE 20 MG PO TABS: 20.0000 mg | ORAL_TABLET | ORAL | Status: DC

## 2014-08-01 NOTE — Patient Instructions (Signed)
CHANGE LASIX TO MON, WED, AND FRI'S; MAY TAKE ON OTHER DAYS AS NEEDED FOR SWELLING  MAKE SURE TO KEEP LEGS ELEVATED WHEN SITTING  TRY TO WEAR COMPRESSION DAILY  Your physician wants you to follow-up in: [place] You will receive a reminder letter in the mail two months in advance. If you don't receive a letter, please call our office to schedule the follow-up appointment.   Your physician wants you to follow-up in: [place].  You will receive a reminder letter in the mail two months in advance. If you don't receive a letter, please call our office to schedule the follow-up appointment.

## 2014-08-01 NOTE — Progress Notes (Signed)
Cardiology Office Note   Date:  08/01/2014   ID:  Ashley Savage, DOB [birth year], MRN [id number]  PCP:  Penni Homans, MD  Cardiologist:  Dr. Fransico Him     History of Present Illness: Ashley Savage is a 74 y.o. female with a history of DM, dyslipidemia, GERD, COPD and ongoing tobacco abuse.  She was evaluated by Dr. Fransico Him 05/2014 for LE edema and diastolic dysfunction noted on Echo as well as preoperative clearance for knee replacement.   Lasix was started for her edema.  Myoview was obtained.  This was negative for ischemia.  Carotid dopplers were obtained for a bruit.  This demonstrated mild to mod bilateral plaque.  FU duplex is recommended in 1 year.  There was an incidental finding of a L thyroid mass.  She was asked to FU with her PCP.    She returns for FU.  She is overall doing well. She denies chest pain. She has chronic dyspnea related to COPD. She is NYHA 2b-3. She denies orthopnea, PND. LE edema is improved. She is having trouble with increased urination from Lasix. She denies syncope.  Studies:   - Echo (10/15):  EF 60-65%, no RWMA, Gr 1 DD, atrial septal lipomatous hypertrophy  - Nuclear (06/25/14):  No ischemia, EF 71%; Normal study  - Carotid US (11/15):  R 1-39%; L 40-59%;  L thyroid 1.8 x 1.8 cm mass >> FU 1 year (FU with PCP for thyroid mass)  - Abdominal US (10/15):  IMPRESSION:  3.3 cm infrarenal abdominal aortic aneurysm. This is not significantly changed compared to prior exam.  Recent Labs: 12/08/2013: TSH 1.158 04/12/2014: LDL (calc) 98 05/31/2014: ALT 20; Hemoglobin 13.8 06/22/2014: BUN 11; Creatinine 0.64; Potassium 3.7; Sodium 139   Estimated Creatinine Clearance: 62.7 mL/min (by C-G formula based on Cr of 0.64).     Wt Readings from Last 3 Encounters:  08/01/14 148 lb 12.8 oz (67.495 kg)  07/24/14 150 lb 3.2 oz (68.13 kg)  07/05/14 151 lb (68.493 kg)     Past Medical History  Diagnosis Date  . Emphysema   . Diabetes mellitus type 2  .  Hyperlipidemia   . Cancer breast ca  right  . Anxiety   . Panic attacks   . Depression   . Arthritis of both knees 10/01/2013  . Benign paroxysmal positional vertigo 10/01/2013  . Neck pain 10/01/2013  . Esophageal reflux 10/01/2013  . Pedal edema 12/10/2013  . Overactive bladder 12/10/2013  . Tobacco abuse disorder 02/01/2014  . COPD (chronic obstructive pulmonary disease) 10/01/2013    Current Outpatient Prescriptions  Medication Sig Dispense Refill  . albuterol (PROVENTIL HFA;VENTOLIN HFA) 108 (90 BASE) MCG/ACT inhaler Inhale 2 puffs into the lungs every 6 (six) hours as needed for wheezing or shortness of breath. Only dispense Ventolin 1 Inhaler 3  . albuterol (PROVENTIL) (2.5 MG/3ML) 0.083% nebulizer solution Inhale one vial via nebulizer four times daily as needed 375 mL 4  . aspirin EC 81 MG tablet Take 1 tablet (81 mg total) by mouth daily. 90 tablet 3  . Blood Glucose Monitoring Suppl (ONE TOUCH ULTRA SYSTEM KIT) W/DEVICE KIT 1 kit by Does not apply route once.    . Cholecalciferol (VITAMIN D3) 2000 UNITS TABS Take 1 capsule by mouth daily.     . diazepam (VALIUM) 5 MG tablet Take 1 tablet (5 mg total) by mouth daily as needed for anxiety or muscle spasms. 1 daily as needed 30 tablet 3  .  Fluticasone-Salmeterol (ADVAIR) 250-50 MCG/DOSE AEPB Inhale 1 puff into the lungs 2 (two) times daily.    . furosemide (LASIX) 20 MG tablet Take 1 tablet (20 mg total) by mouth daily. 30 tablet 3  . lovastatin (MEVACOR) 20 MG tablet Take 1 tablet (20 mg total) by mouth at bedtime. 20 mg 1 by mouth daily 90 tablet 1  . metFORMIN (GLUCOPHAGE) 500 MG tablet Take 500 mg by mouth 2 (two) times daily with a meal.    . omeprazole (PRILOSEC OTC) 20 MG tablet Take 20 mg by mouth daily.      . ONE TOUCH ULTRA TEST test strip by Other route as needed.     Marland Kitchen Respiratory Therapy Supplies (FLUTTER) DEVI Use 3-4 times daily as needed     No current facility-administered medications for this visit.     Allergies:    Citalopram and Erythromycin   Social History:  The patient  reports that she has been smoking Cigarettes.  She started smoking about 50 years ago. She has been smoking about 0.05 packs per day. She has never used smokeless tobacco. She reports that she does not drink alcohol or use illicit drugs.   Family History:  The patient's family history includes Alcohol abuse in her maternal uncle; Arthritis (age of onset: 71) in her father and mother; Breast cancer in her maternal aunt and another family member; COPD in her father; Diabetes in her mother; Heart failure in her father; Hyperlipidemia in her mother; Hypertension in her mother; Pneumonia in her sister; Stroke in her mother.    ROS:  Please see the history of present illness.   She has a chronic cough.   All other systems reviewed and negative.    PHYSICAL EXAM: VS:  BP 120/62 mmHg  Pulse 76  Ht '5\' 7"'  (1.702 m)  Wt 148 lb 12.8 oz (67.495 kg)  BMI 23.30 kg/m2  SpO2 91% Well nourished, well developed, in no acute distress HEENT: normal Neck: no JVD Cardiac:  normal S1, S2;  RRR; no murmur   Lungs:  Decreased breath sounds bilaterally, no wheezing, rhonchi or rales Abd: soft, nontender, no hepatomegaly Ext: trace to 1+ bilateral LE edema Skin: warm and dry Neuro:  CNs 2-12 intact, no focal abnormalities note      ASSESSMENT AND PLAN:  1.  Chronic LE Edema:  Improved.  This is likely multifactorial and related to venous insufficiency, knee DJD, diastolic dysfunction. I have suggested that she change her Lasix to Monday, Wednesday, Friday only. She may take an extra Lasix as needed for increased edema. I've recommended that she keep her legs elevated. I have recommended that she try to wear her compression stockings. 2.  COPD:  FU with pulmonology.  3.  Hypertension:  Controlled.  4.  Diabetes Mellitus:  FU with PCP.  5.  Tobacco Abuse:  We discussed the importance of cessation.  6.  Carotid Stenosis:  FU duplex in 1 year.  7.   Thyroid Mass:  FU with PCP.    8.  AAA:  Followed by PCP.  Stable measurement by last [place] in 04/2014 (3.3 cm).     Disposition:   FU with me in 6 mos and Dr. Fransico Him 1 year.    Signed, Versie Starks, MHS 08/01/2014 11:42 AM    [facility] [address], [place],   [address] Phone: [phone]; Fax: [phone]

## 2014-08-01 NOTE — Assessment & Plan Note (Signed)
Avoid offending foods, start probiotics. Do not eat large meals in late evening and consider raising head of bed.  

## 2014-08-01 NOTE — Assessment & Plan Note (Signed)
Intermittent with paresthesias and some neck pain, referred to Neurology for further consideration

## 2014-08-15 ENCOUNTER — Encounter: Payer: Self-pay | Admitting: Neurology

## 2014-08-15 ENCOUNTER — Ambulatory Visit (INDEPENDENT_AMBULATORY_CARE_PROVIDER_SITE_OTHER): Payer: Medicare HMO | Admitting: Neurology

## 2014-08-15 VITALS — BP 116/68 | HR 83 | Ht 67.0 in | Wt 149.0 lb

## 2014-08-15 DIAGNOSIS — R404 Transient alteration of awareness: Secondary | ICD-10-CM

## 2014-08-15 DIAGNOSIS — G629 Polyneuropathy, unspecified: Secondary | ICD-10-CM

## 2014-08-15 DIAGNOSIS — F05 Delirium due to known physiological condition: Secondary | ICD-10-CM

## 2014-08-15 NOTE — Patient Instructions (Addendum)
1. Call us when ready to do MRI brain with and without contrast 2. Physical exercise and brain stimulation exercises are important for brain health 3. Follow-up in 1 year

## 2014-08-15 NOTE — Progress Notes (Signed)
NEUROLOGY CONSULTATION NOTE  Ashley Savage MRN: [id number] DOB: 11/11/1940  Referring provider: Dr. Penni Homans Primary care provider: Dr. Penni Homans  Reason for consult:  Episodes of confusion  Dear Dr Charlett Blake:  Thank you for your kind referral of Ashley Savage for consultation of the above symptoms. Although her history is well known to you, please allow me to reiterate it for the purpose of our medical record.Records and images were personally reviewed where available.  HISTORY OF PRESENT ILLNESS: This is a pleasant 74 year old right-handed woman with a history of diabetes, hyperlipidemia, breast cancer s/p mastectomy and radiation, emphysema, presenting for 3 episodes of confusion. The first episode occurred in October 2015. She reports that symptoms came on slowly, she noted that she couldn't work on her puzzles and hard difficulties processing. She noticed mistakes while balancing her checkbook. She lives by herself and drove to the ER. She was instructed to see her PCP, who sent her back to the ER where she was diagnosed with a UTI and treated with antibiotics. I personally reviewed head CT done at that time, which was unremarkable. She reports the confusion cleared up within 2 days of starting antibiotics. She did well until she had cataract surgery in November. She went home feeling well, went back to her eye doctor the next day, then that evening she went to go to the bathroom and lost her balance and fell. She crawled back to bed and found that she had wet the bed. She was trying to call her sister but was not dialing the correct number. She again had difficulties doing her puzzles and difficulties getting around the house. Her sister called her and came to her house. Symptoms lasted 4 days then cleared. She had a similar confusional episode after her second cataract surgery in December. She called her ophthalmologist who called EMS to her house, noted she was confused, and advised she  stay with family. Symptoms cleared in 4 days.   She feels her memory is "fair," aside from those 3 episodes, she does well living by herself. She denies getting lost driving, no missed bills or missed medications. She denies any headaches, diplopia, dysarthria, dysphagia, neck/back pain, bowel dysfunction. She has occasional dizziness with a history of positional vertigo since she broke her left jaw. She has a history of neuropathy since 2000, and loses balance in the dark. No falls. She denies any episodes of staring/unresponsiveness, olfactory/gustatory hallucinations, deja vu, rising epigastric sensation, focal numbness/tingling/weakness, myoclonic jerks.She was hit on the head in the 1990s by a robot at work, with loss of consciousness and left jaw fracture. Otherwise she had a normal birth and early development.  There is no history of febrile convulsions, CNS infections such as meningitis/encephalitis, neurosurgical procedures, or family history of seizures.  Lab Results  Component Value Date   WBC 6.1 05/31/2014   HGB 13.8 05/31/2014   HCT 40.1 05/31/2014   MCV 91.1 05/31/2014   PLT 112* 05/31/2014     Chemistry      Component Value Date/Time   NA 139 06/22/2014 1336   NA 142 03/28/2014 1008   K 3.7 06/22/2014 1336   K 3.7 03/28/2014 1008   CL 99 06/22/2014 1336   CL 104 03/28/2014 1008   CO2 30 06/22/2014 1336   CO2 30 03/28/2014 1008   BUN 11 06/22/2014 1336   BUN 8 03/28/2014 1008   CREATININE 0.64 06/22/2014 1336   CREATININE 0.60 05/31/2014 2123  Component Value Date/Time   CALCIUM 8.8 06/22/2014 1336   CALCIUM 9.3 03/28/2014 1008   ALKPHOS 114 05/31/2014 2123   ALKPHOS 85* 03/28/2014 1008   AST 30 05/31/2014 2123   AST 32 03/28/2014 1008   ALT 20 05/31/2014 2123   ALT 23 03/28/2014 1008   BILITOT 1.2 05/31/2014 2123   BILITOT 1.10 03/28/2014 1008     Lab Results  Component Value Date   TSH 1.158 12/08/2013    PAST MEDICAL HISTORY: Past Medical History    Diagnosis Date  . Emphysema   . Diabetes mellitus type 2  . Hyperlipidemia   . Cancer breast ca  right  . Anxiety   . Panic attacks   . Depression   . Arthritis of both knees 10/01/2013  . Benign paroxysmal positional vertigo 10/01/2013  . Neck pain 10/01/2013  . Esophageal reflux 10/01/2013  . Pedal edema 12/10/2013  . Overactive bladder 12/10/2013  . Tobacco abuse disorder 02/01/2014  . COPD (chronic obstructive pulmonary disease) 10/01/2013    PAST SURGICAL HISTORY: Past Surgical History  Procedure Laterality Date  . Gallbladder surgery  1992  . Breast surgery  2009 right  . Appendectomy  2007  . Knee surgery    . Mandible fracture surgery    . Pilonidal cyst excision    . Tonsillectomy    . Cataract extraction      MEDICATIONS: Current Outpatient Prescriptions on File Prior to Visit  Medication Sig Dispense Refill  . albuterol (PROVENTIL HFA;VENTOLIN HFA) 108 (90 BASE) MCG/ACT inhaler Inhale 2 puffs into the lungs every 6 (six) hours as needed for wheezing or shortness of breath. Only dispense Ventolin 1 Inhaler 3  . albuterol (PROVENTIL) (2.5 MG/3ML) 0.083% nebulizer solution Inhale one vial via nebulizer four times daily as needed 375 mL 4  . aspirin EC 81 MG tablet Take 1 tablet (81 mg total) by mouth daily. 90 tablet 3  . Blood Glucose Monitoring Suppl (ONE TOUCH ULTRA SYSTEM KIT) W/DEVICE KIT 1 kit by Does not apply route once.    . Cholecalciferol (VITAMIN D3) 2000 UNITS TABS Take 1 capsule by mouth daily.     . diazepam (VALIUM) 5 MG tablet Take 1 tablet (5 mg total) by mouth daily as needed for anxiety or muscle spasms. 1 daily as needed (Patient taking differently: Take 5 mg by mouth daily as needed for anxiety or muscle spasms. Takes 1/2-1 tablets as needed for anxiety) 30 tablet 3  . Fluticasone-Salmeterol (ADVAIR) 250-50 MCG/DOSE AEPB Inhale 1 puff into the lungs 2 (two) times daily.    . furosemide (LASIX) 20 MG tablet Take 1 tablet (20 mg total) by mouth every Monday,  Wednesday, and Friday. 30 tablet 3  . lovastatin (MEVACOR) 20 MG tablet Take 1 tablet (20 mg total) by mouth at bedtime. 20 mg 1 by mouth daily 90 tablet 1  . metFORMIN (GLUCOPHAGE) 500 MG tablet Take 500 mg by mouth 2 (two) times daily with a meal.    . omeprazole (PRILOSEC OTC) 20 MG tablet Take 20 mg by mouth as needed. Takes 3 times a week    . ONE TOUCH ULTRA TEST test strip by Other route as needed.     Marland Kitchen Respiratory Therapy Supplies (FLUTTER) DEVI Use 3-4 times daily as needed     No current facility-administered medications on file prior to visit.    ALLERGIES: Allergies  Allergen Reactions  . Citalopram     Confusion, irregular heart beat.  . Erythromycin  Stomach cramps    FAMILY HISTORY: Family History  Problem Relation Age of Onset  . Heart failure Father   . COPD Father   . Arthritis Father 85  . Pneumonia Sister   . Breast cancer    . Stroke Mother   . Arthritis Mother 47  . Hyperlipidemia Mother   . Hypertension Mother   . Diabetes Mother   . Breast cancer Maternal Aunt   . Alcohol abuse Maternal Uncle     SOCIAL HISTORY: History   Social History  . Marital Status: Single    Spouse Name: N/A    Number of Children: 0  . Years of Education: N/A   Occupational History  . retire    Social History Main Topics  . Smoking status: Current Some Day Smoker -- 0.05 packs/day    Types: Cigarettes    Start date: 07/27/1964  . Smokeless tobacco: Never Used     Comment: still smoking  03-28-14  . Alcohol Use: No  . Drug Use: No  . Sexual Activity: Yes    Birth Control/ Protection: Post-menopausal   Other Topics Concern  . Not on file   Social History Narrative    REVIEW OF SYSTEMS: Constitutional: No fevers, chills, or sweats, no generalized fatigue, change in appetite Eyes: No visual changes, double vision, eye pain Ear, nose and throat: No hearing loss, ear pain, nasal congestion, sore throat Cardiovascular: No chest pain,  palpitations Respiratory:  No shortness of breath at rest or with exertion, wheezes GastrointestinaI: No nausea, vomiting, diarrhea, abdominal pain, fecal incontinence Genitourinary:  No dysuria, urinary retention or frequency Musculoskeletal:  No neck pain, back pain Integumentary: No rash, pruritus, skin lesions Neurological: as above Psychiatric: No depression, insomnia, anxiety Endocrine: No palpitations, fatigue, diaphoresis, mood swings, change in appetite, change in weight, increased thirst Hematologic/Lymphatic:  No anemia, purpura, petechiae. Allergic/Immunologic: no itchy/runny eyes, nasal congestion, recent allergic reactions, rashes  PHYSICAL EXAM: Filed Vitals:   08/15/14 1342  BP: 116/68  Pulse: 83   General: No acute distress Head:  Normocephalic/atraumatic Eyes: Fundoscopic exam shows bilateral sharp discs, no vessel changes, exudates, or hemorrhages Neck: supple, no paraspinal tenderness, full range of motion Back: No paraspinal tenderness Heart: regular rate and rhythm Lungs: Clear to auscultation bilaterally. Vascular: No carotid bruits. Skin/Extremities: No rash, no edema Neurological Exam: Mental status: alert and oriented to person, place, and time, no dysarthria or aphasia, Fund of knowledge is appropriate.  Recent and remote memory are intact.  Attention and concentration are normal.    Able to name objects and repeat phrases. MMSE - Mini Mental State Exam 08/15/2014  Orientation to time 5  Orientation to Place 5  Registration 3  Attention/ Calculation 5  Recall 2  Language- name 2 objects 2  Language- repeat 1  Language- follow 3 step command 3  Language- read & follow direction 1  Write a sentence 1  Copy design 1  Total score 29   Cranial nerves: CN I: not tested CN II: pupils equal, round and reactive to light, visual fields intact, fundi unremarkable. CN III, IV, VI:  full range of motion, no nystagmus, no ptosis CN V: facial sensation  intact CN VII: upper and lower face symmetric CN VIII: hearing intact to finger rub CN IX, X: gag intact, uvula midline CN XI: sternocleidomastoid and trapezius muscles intact CN XII: tongue midline Bulk & Tone: normal, no fasciculations. Motor: 5/5 throughout with no pronator drift. Sensation: subjective decreased pin on right calf, otherwise  intact to light touch, cold, vibration and joint position sense.  No extinction to double simultaneous stimulation.  Romberg test positive Deep Tendon Reflexes: +2 throughout except for absent ankle jerks bilaterally, no ankle clonus Plantar responses: downgoing bilaterally Cerebellar: no incoordination on finger to nose, heel to shin. No dysdiadochokinesia Gait: narrow-based and steady, difficulty with tandem walk Tremor: No resting tremor. +high frequency, low amplitude postural and action tremor bilaterally, +jaw tremor  IMPRESSION: This is a 74 year old right-handed woman with hypertension, diabetes, breast cancer, with 3 episodes of confusion suggestive of delirium. The first episode occurred in the setting of UTI, the next 2 episodes occurred a day after cataract surgery. No clear evidence of dementia at this time, her MMSE is normal at 29/30. Neurological exam shows mild length-dependent neuropathy. With her history of breast cancer, we discussed doing an MRI brain with and without contrast to assess for underlying structural abnormality. She would like to hold off for now and will call our office to scheduled this. We discussed the importance of physical and brain stimulation exercises for brain health. She will follow-up in 1 year or earlier if needed.   Thank you for allowing me to participate in the care of this patient. Please do not hesitate to call for any questions or concerns.   Ellouise Newer, M.D.  CC: Dr. Charlett Blake

## 2014-08-27 ENCOUNTER — Other Ambulatory Visit: Payer: Self-pay | Admitting: Family Medicine

## 2014-09-26 ENCOUNTER — Encounter: Payer: Self-pay | Admitting: Internal Medicine

## 2014-09-26 ENCOUNTER — Ambulatory Visit (HOSPITAL_BASED_OUTPATIENT_CLINIC_OR_DEPARTMENT_OTHER): Payer: Medicare HMO | Admitting: Lab

## 2014-09-26 ENCOUNTER — Encounter: Payer: Self-pay | Admitting: Family

## 2014-09-26 ENCOUNTER — Ambulatory Visit (HOSPITAL_BASED_OUTPATIENT_CLINIC_OR_DEPARTMENT_OTHER): Payer: Medicare HMO | Admitting: Family

## 2014-09-26 VITALS — BP 119/49 | Temp 97.6°F | Resp 16

## 2014-09-26 DIAGNOSIS — Z853 Personal history of malignant neoplasm of breast: Secondary | ICD-10-CM

## 2014-09-26 DIAGNOSIS — Z720 Tobacco use: Secondary | ICD-10-CM

## 2014-09-26 DIAGNOSIS — C50911 Malignant neoplasm of unspecified site of right female breast: Secondary | ICD-10-CM

## 2014-09-26 DIAGNOSIS — E559 Vitamin D deficiency, unspecified: Secondary | ICD-10-CM

## 2014-09-26 LAB — COMPREHENSIVE METABOLIC PANEL (CC13)
ALT: 23 U/L (ref 0–55)
AST: 33 U/L (ref 5–34)
Albumin: 3.3 g/dL — ABNORMAL LOW (ref 3.5–5.0)
Alkaline Phosphatase: 117 U/L (ref 40–150)
Anion Gap: 13 mEq/L — ABNORMAL HIGH (ref 3–11)
BILIRUBIN TOTAL: 1.56 mg/dL — AB (ref 0.20–1.20)
BUN: 19.2 mg/dL (ref 7.0–26.0)
CALCIUM: 9.2 mg/dL (ref 8.4–10.4)
CO2: 29 mEq/L (ref 22–29)
Chloride: 98 mEq/L (ref 98–109)
Creatinine: 0.8 mg/dL (ref 0.6–1.1)
EGFR: 77 mL/min/{1.73_m2} — AB (ref >90)
Glucose: 186 mg/dl — ABNORMAL HIGH (ref 70–140)
Potassium: 4.3 mEq/L (ref 3.5–5.1)
SODIUM: 139 meq/L (ref 136–145)
TOTAL PROTEIN: 6.1 g/dL — AB (ref 6.4–8.3)

## 2014-09-26 LAB — CBC WITH DIFFERENTIAL (CANCER CENTER ONLY)
BASO#: 0.1 10*3/uL (ref 0.0–0.2)
BASO%: 1.1 % (ref 0.0–2.0)
EOS%: 3.5 % (ref 0.0–7.0)
Eosinophils Absolute: 0.2 10*3/uL (ref 0.0–0.5)
HCT: 40.1 % (ref 34.8–46.6)
HEMOGLOBIN: 13 g/dL (ref 11.6–15.9)
LYMPH#: 1.5 10*3/uL (ref 0.9–3.3)
LYMPH%: 33.6 % (ref 14.0–48.0)
MCH: 30.4 pg (ref 26.0–34.0)
MCHC: 32.4 g/dL (ref 32.0–36.0)
MCV: 94 fL (ref 81–101)
MONO#: 0.7 10*3/uL (ref 0.1–0.9)
MONO%: 14.3 % — ABNORMAL HIGH (ref 0.0–13.0)
NEUT#: 2.2 10*3/uL (ref 1.5–6.5)
NEUT%: 47.5 % (ref 39.6–80.0)
Platelets: 128 10*3/uL — ABNORMAL LOW (ref 145–400)
RBC: 4.27 10*6/uL (ref 3.70–5.32)
RDW: 14.4 % (ref 11.1–15.7)
WBC: 4.5 10*3/uL (ref 3.9–10.0)

## 2014-09-26 NOTE — Progress Notes (Signed)
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 She is having issues with her right mastectomy site draining a yellow fluid from the area where she had a JP drain after surgery. She has an appointment with her surgeon next Wednesday. She has already had 2 rounds of antibiotics and the are is still draining.  She is still smoking 1/2 ppd sometimes more if she is stressed.  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 She is very excited about this.  She does complain of some neuropathy in her feet but states that it does not affect her gait.  She denies fatigue, fever, chills, n/v, cough, rash, dizziness, headache, chest pain, palpitations, abdominal pain, constipation, diarrhea, blood in urine or stool. She has COPD and does have SOB with exertion.  No swelling or tenderness in her extremities. No new aches or pains.  Her appetite is good and she is staying hydrated. Her weight is stable at 147 lbs.  She recently had catracts removed from both eyes and developed a UTI afterwards. She was treated with antibiotics and this has resolved. She did find that Versed causes her to be confused for several days.  Her mammogram in December was negative.   CURRENT TREATMENT: Patient is status post 5 years of Femara-completed in February 2015  REVIEW OF SYSTEMS: All other 10 point review of systems is negative.   PAST MEDICAL HISTORY: Past Medical History  Diagnosis Date  . Emphysema   . Diabetes mellitus type 2  . Hyperlipidemia   . Cancer breast  ca  right  . Anxiety   . Panic attacks   . Depression   . Arthritis of both knees 10/01/2013  . Benign paroxysmal positional vertigo 10/01/2013  . Neck pain 10/01/2013  . Esophageal reflux 10/01/2013  . Pedal edema 12/10/2013  . Overactive bladder 12/10/2013  . Tobacco abuse disorder 02/01/2014  . COPD (chronic obstructive pulmonary disease) 10/01/2013    PAST SURGICAL HISTORY: Past Surgical History  Procedure Laterality Date  . Gallbladder surgery  1992  . Breast surgery  2009 right  . Appendectomy  2007  . Knee surgery    . Mandible fracture surgery    . Pilonidal cyst excision    . Tonsillectomy    . Cataract extraction      FAMILY HISTORY Family History  Problem Relation Age of Onset  . Heart failure Father   . COPD Father   . Arthritis Father 59  . Pneumonia Sister   . Breast cancer    . Stroke Mother   . Arthritis Mother 12  . Hyperlipidemia Mother   . Hypertension Mother   . Diabetes Mother   . Breast cancer Maternal Aunt   . Alcohol abuse Maternal Uncle     GYNECOLOGIC HISTORY:  No LMP recorded. Patient is postmenopausal.   SOCIAL HISTORY: History   Social History  . Marital Status: Single    Spouse Name: N/A  . Number of Children: 0  . Years of Education: N/A   Occupational History  . retire    Social History Main Topics  .  Smoking status: Current Some Day Smoker -- 0.05 packs/day    Types: Cigarettes    Start date: 07/27/1964  . Smokeless tobacco: Never Used     Comment: still smoking  09-2014  . Alcohol Use: No  . Drug Use: No  . Sexual Activity: Yes    Birth Control/ Protection: Post-menopausal   Other Topics Concern  . Not on file   Social History Narrative    ADVANCED DIRECTIVES:  <no information>  HEALTH MAINTENANCE: History  Substance Use Topics  . Smoking status: Current Some Day Smoker -- 0.05 packs/day    Types: Cigarettes    Start date: 07/27/1964  . Smokeless tobacco: Never Used     Comment: still smoking  09-2014  .  Alcohol Use: No   Colonoscopy: PAP: Bone density: Lipid panel:  Allergies  Allergen Reactions  . Citalopram     Confusion, irregular heart beat.  . Erythromycin     Stomach cramps    Current Outpatient Prescriptions  Medication Sig Dispense Refill  . albuterol (PROVENTIL HFA;VENTOLIN HFA) 108 (90 BASE) MCG/ACT inhaler Inhale 2 puffs into the lungs every 6 (six) hours as needed for wheezing or shortness of breath. Only dispense Ventolin 1 Inhaler 3  . albuterol (PROVENTIL) (2.5 MG/3ML) 0.083% nebulizer solution Inhale one vial via nebulizer four times daily as needed 375 mL 4  . aspirin EC 81 MG tablet Take 1 tablet (81 mg total) by mouth daily. 90 tablet 3  . Blood Glucose Monitoring Suppl (ONE TOUCH ULTRA SYSTEM KIT) W/DEVICE KIT 1 kit by Does not apply route once.    . Cholecalciferol (VITAMIN D3) 2000 UNITS TABS Take 1 capsule by mouth daily.     . diazepam (VALIUM) 5 MG tablet Take 1 tablet (5 mg total) by mouth daily as needed for anxiety or muscle spasms. 1 daily as needed (Patient taking differently: Take 5 mg by mouth daily as needed for anxiety or muscle spasms. Takes 1/2-1 tablets as needed for anxiety) 30 tablet 3  . Fluticasone-Salmeterol (ADVAIR) 250-50 MCG/DOSE AEPB Inhale 1 puff into the lungs 2 (two) times daily.    . furosemide (LASIX) 20 MG tablet Take 1 tablet (20 mg total) by mouth every Monday, Wednesday, and Friday. 30 tablet 3  . lovastatin (MEVACOR) 20 MG tablet Take 1 tablet (20 mg total) by mouth at bedtime. 20 mg 1 by mouth daily 90 tablet 1  . metFORMIN (GLUCOPHAGE) 500 MG tablet TAKE 2 TABLETS (1,000 MG TOTAL) BY MOUTH 2 (TWO) TIMES DAILY WITH A MEAL. 60 tablet 2  . omeprazole (PRILOSEC OTC) 20 MG tablet Take 20 mg by mouth as needed. Takes 3 times a week    . ONE TOUCH ULTRA TEST test strip by Other route as needed.     Marland Kitchen Respiratory Therapy Supplies (FLUTTER) DEVI Use 3-4 times daily as needed     No current facility-administered medications for this  visit.    OBJECTIVE: Filed Vitals:   09/26/14 1045  BP: 119/49  Temp: 97.6 F (36.4 C)  Resp: 16   There were no vitals filed for this visit. ECOG FS:1 - Symptomatic but completely ambulatory Ocular: Sclerae unicteric, pupils equal, round and reactive to light Ear-nose-throat: Oropharynx clear, dentition fair Lymphatic: No cervical or supraclavicular adenopathy Lungs no rales or rhonchi, good excursion bilaterally Heart regular rate and rhythm, no murmur appreciated Abd soft, nontender, positive bowel sounds MSK no focal spinal tenderness, no joint edema Neuro: non-focal, well-oriented, appropriate affect Breasts: She has a small "  pin sized area" to her right mastectomy scar that is draining a yellow fluid. She has no mass, lesions or rash and no lymphadenopathy.   LAB RESULTS: CMP     Component Value Date/Time   NA 139 06/22/2014 1336   NA 142 03/28/2014 1008   K 3.7 06/22/2014 1336   K 3.7 03/28/2014 1008   CL 99 06/22/2014 1336   CL 104 03/28/2014 1008   CO2 30 06/22/2014 1336   CO2 30 03/28/2014 1008   GLUCOSE 173* 06/22/2014 1336   GLUCOSE 148* 03/28/2014 1008   BUN 11 06/22/2014 1336   BUN 8 03/28/2014 1008   CREATININE 0.64 06/22/2014 1336   CREATININE 0.60 05/31/2014 2123   CALCIUM 8.8 06/22/2014 1336   CALCIUM 9.3 03/28/2014 1008   PROT 6.5 05/31/2014 2123   PROT 6.2* 03/28/2014 1008   ALBUMIN 3.4* 05/31/2014 2123   AST 30 05/31/2014 2123   AST 32 03/28/2014 1008   ALT 20 05/31/2014 2123   ALT 23 03/28/2014 1008   ALKPHOS 114 05/31/2014 2123   ALKPHOS 85* 03/28/2014 1008   BILITOT 1.2 05/31/2014 2123   BILITOT 1.10 03/28/2014 1008   GFRNONAA 90* 05/31/2014 2123   GFRAA >90 05/31/2014 2123   INo results found for: SPEP, UPEP Lab Results  Component Value Date   WBC 4.5 09/26/2014   NEUTROABS 2.2 09/26/2014   HGB 13.0 09/26/2014   HCT 40.1 09/26/2014   MCV 94 09/26/2014   PLT 128* 09/26/2014   Lab Results  Component Value Date   LABCA2 45*  01/11/2009   No components found for: UXLKG401 No results for input(s): INR in the last 168 hours.  STUDIES: No results found.  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 Her tumor was ER positive. She has completed Femara for 5 years. She has a small area at the right edge of her mastectomy scar that is open and draining a yellow fluid. She is seeing her surgeon on Wednesday.  Her CBC today is good. We will see what the rest of her labs show.  We will see her back in 1 year for labs and follow-up.  She knows to call here with any questions or concerns and to go to the ED in the event of an emergency. We can certainly see her sooner if need be.   Eliezer Bottom, NP 09/26/2014 11:31 AM

## 2014-09-27 LAB — VITAMIN D 25 HYDROXY (VIT D DEFICIENCY, FRACTURES): VIT D 25 HYDROXY: 50 ng/mL (ref 30–100)

## 2014-10-04 ENCOUNTER — Encounter: Payer: Self-pay | Admitting: Family Medicine

## 2014-10-04 ENCOUNTER — Ambulatory Visit (INDEPENDENT_AMBULATORY_CARE_PROVIDER_SITE_OTHER): Payer: Medicare HMO | Admitting: Family Medicine

## 2014-10-04 ENCOUNTER — Other Ambulatory Visit: Payer: Self-pay | Admitting: General Practice

## 2014-10-04 VITALS — BP 132/80 | HR 114 | Temp 98.1°F | Resp 17 | Wt 147.1 lb

## 2014-10-04 DIAGNOSIS — T148XXA Other injury of unspecified body region, initial encounter: Secondary | ICD-10-CM

## 2014-10-04 DIAGNOSIS — T148 Other injury of unspecified body region: Secondary | ICD-10-CM

## 2014-10-04 MED ORDER — OMEPRAZOLE 20 MG PO CPDR: 20.0000 mg | DELAYED_RELEASE_CAPSULE | Freq: Every day | ORAL | Status: DC

## 2014-10-04 NOTE — Progress Notes (Signed)
Pre visit review using our clinic review tool, if applicable. No additional management support is needed unless otherwise documented below in the visit note. 

## 2014-10-04 NOTE — Progress Notes (Signed)
   Subjective:    [name], female    DOB: [birth year], 74 y.o.   MRN: [id number]  HPI Bruising- pt reports 'i've been bruising real easy'.  Pt has one bruise on dorsum of R hand and has 2 small purple areas- 1 on R forearm and 1 on dorsum of L hand.  Pt reports the bruising is 'lasting longer and taking less effort'.  Pt just recently started on ASA 81mg .  No nose bleeds, bleeding gums, BRBPR, hematuria.  Pt reports taking 'a lot of Aleve'- taking 2 tabs daily.  Pt is taking enteric coated ASA.   Review of Systems For ROS see HPI     Objective:   Physical Exam  Constitutional: She appears well-developed and well-nourished. No distress.  Skin: Skin is warm and dry. No rash noted. No erythema. No pallor.  Violaceous bruise on dorsum of R hand and smaller similar areas on dorsum of L hand and R forearm No bruising of legs, trunk, back  Vitals reviewed.         Assessment & Plan:

## 2014-10-04 NOTE — Assessment & Plan Note (Signed)
New.  Likely combination of thin, aging skin, new ASA use and regular use of Aleve.  Bruising does not seem excessive or abnormal.  Discussed cutting back on Aleve- particularly in setting of GI upset and increased risk of bleeding on ASA.  Pt says she would 'think about it' b/c it helps her arthritic pain.  Suggested tylenol instead.  Pt to f/u w/ PCP.

## 2014-10-04 NOTE — Patient Instructions (Signed)
You are due for a follow up w/ Dr Charlett Blake- please schedule on your way out The bruising is due to the aspirin and the Aleve Try and decrease your Aleve to 1 tab daily and make sure you are taking w/ food There are no signs of excessive bleeding or bruising- this is good news! Call with any questions or concerns Hang in there!!!

## 2014-10-15 ENCOUNTER — Observation Stay (HOSPITAL_BASED_OUTPATIENT_CLINIC_OR_DEPARTMENT_OTHER)
Admission: EM | Admit: 2014-10-15 | Discharge: 2014-10-18 | Disposition: A | Discharge status: Home / Self Care | Payer: Medicare HMO | Attending: Internal Medicine | Admitting: Internal Medicine

## 2014-10-15 ENCOUNTER — Emergency Department (HOSPITAL_BASED_OUTPATIENT_CLINIC_OR_DEPARTMENT_OTHER): Payer: Medicare HMO

## 2014-10-15 ENCOUNTER — Encounter (HOSPITAL_BASED_OUTPATIENT_CLINIC_OR_DEPARTMENT_OTHER): Payer: Self-pay | Admitting: *Deleted

## 2014-10-15 DIAGNOSIS — R7401 Elevation of levels of liver transaminase levels: Secondary | ICD-10-CM

## 2014-10-15 DIAGNOSIS — I10 Essential (primary) hypertension: Secondary | ICD-10-CM | POA: Diagnosis not present

## 2014-10-15 DIAGNOSIS — J439 Emphysema, unspecified: Secondary | ICD-10-CM | POA: Insufficient documentation

## 2014-10-15 DIAGNOSIS — R740 Nonspecific elevation of levels of transaminase and lactic acid dehydrogenase [LDH]: Secondary | ICD-10-CM

## 2014-10-15 DIAGNOSIS — K219 Gastro-esophageal reflux disease without esophagitis: Secondary | ICD-10-CM | POA: Insufficient documentation

## 2014-10-15 DIAGNOSIS — I5032 Chronic diastolic (congestive) heart failure: Secondary | ICD-10-CM | POA: Diagnosis not present

## 2014-10-15 DIAGNOSIS — I714 Abdominal aortic aneurysm, without rupture, unspecified: Secondary | ICD-10-CM | POA: Diagnosis present

## 2014-10-15 DIAGNOSIS — F419 Anxiety disorder, unspecified: Secondary | ICD-10-CM | POA: Diagnosis not present

## 2014-10-15 DIAGNOSIS — Z79899 Other long term (current) drug therapy: Secondary | ICD-10-CM | POA: Insufficient documentation

## 2014-10-15 DIAGNOSIS — J441 Chronic obstructive pulmonary disease with (acute) exacerbation: Secondary | ICD-10-CM | POA: Diagnosis present

## 2014-10-15 DIAGNOSIS — F17210 Nicotine dependence, cigarettes, uncomplicated: Secondary | ICD-10-CM | POA: Diagnosis not present

## 2014-10-15 DIAGNOSIS — Z881 Allergy status to other antibiotic agents status: Secondary | ICD-10-CM | POA: Diagnosis not present

## 2014-10-15 DIAGNOSIS — E785 Hyperlipidemia, unspecified: Secondary | ICD-10-CM | POA: Insufficient documentation

## 2014-10-15 DIAGNOSIS — E119 Type 2 diabetes mellitus without complications: Secondary | ICD-10-CM | POA: Insufficient documentation

## 2014-10-15 DIAGNOSIS — Z720 Tobacco use: Secondary | ICD-10-CM | POA: Diagnosis present

## 2014-10-15 DIAGNOSIS — Z7982 Long term (current) use of aspirin: Secondary | ICD-10-CM | POA: Insufficient documentation

## 2014-10-15 DIAGNOSIS — Z853 Personal history of malignant neoplasm of breast: Secondary | ICD-10-CM | POA: Insufficient documentation

## 2014-10-15 DIAGNOSIS — G9340 Encephalopathy, unspecified: Secondary | ICD-10-CM | POA: Diagnosis not present

## 2014-10-15 DIAGNOSIS — F329 Major depressive disorder, single episode, unspecified: Secondary | ICD-10-CM | POA: Diagnosis not present

## 2014-10-15 DIAGNOSIS — D696 Thrombocytopenia, unspecified: Secondary | ICD-10-CM | POA: Insufficient documentation

## 2014-10-15 DIAGNOSIS — F410 Panic disorder [episodic paroxysmal anxiety] without agoraphobia: Secondary | ICD-10-CM | POA: Insufficient documentation

## 2014-10-15 DIAGNOSIS — Z9010 Acquired absence of unspecified breast and nipple: Secondary | ICD-10-CM | POA: Diagnosis not present

## 2014-10-15 DIAGNOSIS — R4182 Altered mental status, unspecified: Principal | ICD-10-CM | POA: Insufficient documentation

## 2014-10-15 LAB — URINALYSIS, ROUTINE W REFLEX MICROSCOPIC
Bilirubin Urine: NEGATIVE
Glucose, UA: 100 mg/dL — AB
Hgb urine dipstick: NEGATIVE
Ketones, ur: NEGATIVE mg/dL
LEUKOCYTES UA: NEGATIVE
NITRITE: NEGATIVE
PH: 6 (ref 5.0–8.0)
PROTEIN: NEGATIVE mg/dL
Specific Gravity, Urine: 1.018 (ref 1.005–1.030)
Urobilinogen, UA: 2 mg/dL — ABNORMAL HIGH (ref 0.0–1.0)

## 2014-10-15 LAB — CBC WITH DIFFERENTIAL/PLATELET
BASOS ABS: 0 10*3/uL (ref 0.0–0.1)
BASOS PCT: 1 % (ref 0–1)
Eosinophils Absolute: 0.1 10*3/uL (ref 0.0–0.7)
Eosinophils Relative: 2 % (ref 0–5)
HCT: 40.5 % (ref 36.0–46.0)
HEMOGLOBIN: 13.5 g/dL (ref 12.0–15.0)
Lymphocytes Relative: 29 % (ref 12–46)
Lymphs Abs: 1.4 10*3/uL (ref 0.7–4.0)
MCH: 30.7 pg (ref 26.0–34.0)
MCHC: 33.3 g/dL (ref 30.0–36.0)
MCV: 92 fL (ref 78.0–100.0)
MONOS PCT: 14 % — AB (ref 3–12)
Monocytes Absolute: 0.7 10*3/uL (ref 0.1–1.0)
NEUTROS ABS: 2.7 10*3/uL (ref 1.7–7.7)
Neutrophils Relative %: 54 % (ref 43–77)
Platelets: 127 10*3/uL — ABNORMAL LOW (ref 150–400)
RBC: 4.4 MIL/uL (ref 3.87–5.11)
RDW: 14.5 % (ref 11.5–15.5)
WBC: 5 10*3/uL (ref 4.0–10.5)

## 2014-10-15 LAB — AMMONIA: Ammonia: 64 umol/L — ABNORMAL HIGH (ref 11–32)

## 2014-10-15 LAB — COMPREHENSIVE METABOLIC PANEL
ALBUMIN: 3.7 g/dL (ref 3.5–5.2)
ALK PHOS: 98 U/L (ref 39–117)
ALT: 28 U/L (ref 0–35)
AST: 41 U/L — AB (ref 0–37)
Anion gap: 9 (ref 5–15)
BILIRUBIN TOTAL: 1.3 mg/dL — AB (ref 0.3–1.2)
BUN: 15 mg/dL (ref 6–23)
CO2: 28 mmol/L (ref 19–32)
Calcium: 9.5 mg/dL (ref 8.4–10.5)
Chloride: 105 mmol/L (ref 96–112)
Creatinine, Ser: 0.64 mg/dL (ref 0.50–1.10)
GFR calc Af Amer: >90 mL/min (ref >90)
GFR calc non Af Amer: 88 mL/min — ABNORMAL LOW (ref >90)
Glucose, Bld: 109 mg/dL — ABNORMAL HIGH (ref 70–99)
Potassium: 4.2 mmol/L (ref 3.5–5.1)
Sodium: 142 mmol/L (ref 135–145)
TOTAL PROTEIN: 6.6 g/dL (ref 6.0–8.3)

## 2014-10-15 LAB — I-STAT ARTERIAL BLOOD GAS, ED
BICARBONATE: 24.5 meq/L — AB (ref 20.0–24.0)
O2 Saturation: 95 %
PCO2 ART: 37.2 mmHg (ref 35.0–45.0)
PH ART: 7.426 (ref 7.350–7.450)
PO2 ART: 70 mmHg — AB (ref 80.0–100.0)
Patient temperature: 98
TCO2: 26 mmol/L (ref 0–100)

## 2014-10-15 LAB — ETHANOL

## 2014-10-15 LAB — RAPID URINE DRUG SCREEN, HOSP PERFORMED
Amphetamines: NOT DETECTED
Barbiturates: NOT DETECTED
Benzodiazepines: POSITIVE — AB
COCAINE: NOT DETECTED
OPIATES: NOT DETECTED
Tetrahydrocannabinol: NOT DETECTED

## 2014-10-15 NOTE — ED Notes (Signed)
Pt returned from CT and XR.

## 2014-10-15 NOTE — Progress Notes (Signed)
Pt arrived to unit alert and oriented x4. Oriented to room, unit, and staff.  Bed in lowest position and call bell is within reach. Will continue to monitor. Admissions paged.

## 2014-10-15 NOTE — Progress Notes (Signed)
Received report from [place], [place] in [facility].

## 2014-10-15 NOTE — Plan of Care (Signed)
Problem: Phase I Progression Outcomes Goal: OOB as tolerated unless otherwise ordered Outcome: Progressing With assistance

## 2014-10-15 NOTE — ED Notes (Signed)
Report given to [facility] at [place] for pick up 20 min.

## 2014-10-15 NOTE — ED Notes (Signed)
MD at bedside. 

## 2014-10-15 NOTE — ED Provider Notes (Signed)
CSN: [id number]     Arrival date & time 10/15/14  1638 History  This chart was scribed for Ashley Freiberg, MD by Chester Holstein, ED Scribe. This patient was seen in room MH06/MH06 and the patient's care was started at 5:38 PM.     Chief Complaint  Patient presents with  . Altered Mental Status     Patient is a 74 y.o. female presenting with altered mental status. The history is provided by the patient. No language interpreter was used.  Altered Mental Status Presenting symptoms: confusion   Severity:  Moderate Most recent episode:  Yesterday Episode history:  Continuous Duration:  2 days Timing:  Constant Progression:  Partially resolved Chronicity:  Recurrent Context: dementia (concern for early dementia with prior visits)   Associated symptoms: no abdominal pain, no agitation, no bladder incontinence, no fever, no hallucinations, no nausea, no suicidal behavior, no visual change, no vomiting and no weakness    HPI Comments: MARSI TURVEY is a 74 y.o. female with PMHx of DM, emphysema, HLD, CA, COPD who presents to the Emergency Department complaining of AMS since last night. Pt states she has felt confused and unsteady since onset. She states she felt stressed and upset prior to onset. She notes she was had to use her rollator to ambulate last night but states she was able to walk unassisted today. Pt has an unsteady gait at baseline. Pt states symptoms have presented in past in November 2015 following a UTI. Pt saw a neurologist in January for a f/u. Pt with h/o recurrent UTIs. Pt states her urine has been dark recently. Pt having difficulty providing history with complete ideas, unsure if this is baseline. Pt is on Lasix. Pt A&O x4. Pt denies fever, vomiting, diarrhea, constipation, dysuria, new numbness or tingling, weakness, cough, congestion, and sore throat.    Past Medical History  Diagnosis Date  . Emphysema   . Diabetes mellitus type 2  . Hyperlipidemia   . Cancer breast ca   right  . Anxiety   . Panic attacks   . Depression   . Arthritis of both knees 10/01/2013  . Benign paroxysmal positional vertigo 10/01/2013  . Neck pain 10/01/2013  . Esophageal reflux 10/01/2013  . Pedal edema 12/10/2013  . Overactive bladder 12/10/2013  . Tobacco abuse disorder 02/01/2014  . COPD (chronic obstructive pulmonary disease) 10/01/2013   Past Surgical History  Procedure Laterality Date  . Gallbladder surgery  1992  . Breast surgery  2009 right  . Appendectomy  2007  . Knee surgery    . Mandible fracture surgery    . Pilonidal cyst excision    . Tonsillectomy    . Cataract extraction     Family History  Problem Relation Age of Onset  . Heart failure Father   . COPD Father   . Arthritis Father 50  . Pneumonia Sister   . Breast cancer    . Stroke Mother   . Arthritis Mother 38  . Hyperlipidemia Mother   . Hypertension Mother   . Diabetes Mother   . Breast cancer Maternal Aunt   . Alcohol abuse Maternal Uncle    History  Substance Use Topics  . Smoking status: Current Some Day Smoker -- 0.05 packs/day    Types: Cigarettes    Start date: 07/27/1964  . Smokeless tobacco: Never Used     Comment: still smoking  09-2014  . Alcohol Use: No   OB History    No data available  Review of Systems  Constitutional: Negative for fever.  HENT: Negative for congestion and sore throat.   Respiratory: Negative for cough.   Gastrointestinal: Negative for nausea, vomiting, abdominal pain, diarrhea and constipation.  Genitourinary: Negative for bladder incontinence and dysuria.  Musculoskeletal: Positive for gait problem.  Neurological: Negative for weakness and numbness.  Psychiatric/Behavioral: Positive for confusion. Negative for hallucinations and agitation.  All other systems reviewed and are negative.     Allergies  Citalopram and Erythromycin  Home Medications   Prior to Admission medications   Medication Sig Start Date End Date Taking? Authorizing Provider   albuterol (PROVENTIL HFA;VENTOLIN HFA) 108 (90 BASE) MCG/ACT inhaler Inhale 2 puffs into the lungs every 6 (six) hours as needed for wheezing or shortness of breath. Only dispense Ventolin 09/26/13  Yes Mosie Lukes, MD  albuterol (PROVENTIL) (2.5 MG/3ML) 0.083% nebulizer solution Inhale one vial via nebulizer four times daily as needed Patient taking differently: Inhale one vial via nebulizer four times daily as needed for shortness of breath   Yes Elsie Stain, MD  aspirin EC 81 MG tablet Take 1 tablet (81 mg total) by mouth daily. 06/27/14  Yes Sueanne Margarita, MD  Cholecalciferol (VITAMIN D3) 2000 UNITS TABS Take 1 capsule by mouth daily.    Yes Historical Provider, MD  diazepam (VALIUM) 5 MG tablet Take 1 tablet (5 mg total) by mouth daily as needed for anxiety or muscle spasms. 1 daily as needed Patient taking differently: Take 2.5 mg by mouth daily as needed for anxiety or muscle spasms. Takes 1/2-1 tablets as needed for anxiety 12/08/13  Yes Mosie Lukes, MD  Fluticasone-Salmeterol (ADVAIR) 250-50 MCG/DOSE AEPB Inhale 1 puff into the lungs 2 (two) times daily.   Yes Historical Provider, MD  furosemide (LASIX) 20 MG tablet Take 1 tablet (20 mg total) by mouth every Monday, Wednesday, and Friday. Patient taking differently: Take 20 mg by mouth 2 (two) times a week. Sunday and Wednesday 08/01/14  Yes Scott Joylene Draft, PA-C  lovastatin (MEVACOR) 20 MG tablet Take 1 tablet (20 mg total) by mouth at bedtime. 20 mg 1 by mouth daily 10/23/13  Yes Mosie Lukes, MD  metFORMIN (GLUCOPHAGE) 500 MG tablet TAKE 2 TABLETS (1,000 MG TOTAL) BY MOUTH 2 (TWO) TIMES DAILY WITH A MEAL. Patient taking differently: TAKE 1 TABLET  BY MOUTH 2 (TWO) TIMES DAILY WITH A MEAL. 08/28/14  Yes Mosie Lukes, MD  omeprazole (PRILOSEC) 20 MG capsule Take 1 capsule (20 mg total) by mouth daily. 10/04/14  Yes Mosie Lukes, MD   BP 109/43 mmHg  Pulse 70  Temp(Src) 97.6 F (36.4 C) (Oral)  Resp 25  Ht 5' 6.5" (1.689 m)  Wt  144 lb 9.6 oz (65.59 kg)  BMI 22.99 kg/m2  SpO2 96% Physical Exam  Constitutional: She is oriented to person, place, and time. She appears well-developed and well-nourished.  HENT:  Head: Normocephalic and atraumatic.  Right Ear: External ear normal.  Left Ear: External ear normal.  Eyes: Conjunctivae and EOM are normal. Pupils are equal, round, and reactive to light.  Neck: Normal range of motion. Neck supple.  Cardiovascular: Normal rate, regular rhythm, normal heart sounds and intact distal pulses.   Pulmonary/Chest: Effort normal and breath sounds normal.  Abdominal: Soft. Bowel sounds are normal. There is no tenderness.  Musculoskeletal: Normal range of motion.  Neurological: She is alert and oriented to person, place, and time. She has normal strength and normal reflexes. No cranial nerve  deficit or sensory deficit. Gait normal. GCS eye subscore is 4. GCS verbal subscore is 4. GCS motor subscore is 6.  Skin: Skin is warm and dry.  Vitals reviewed.   ED Course  Procedures (including critical care time) DIAGNOSTIC STUDIES: Oxygen Saturation is 95% on room air, normal by my interpretation.    COORDINATION OF CARE: 5:48 PM Discussed treatment plan with patient at beside, the patient agrees with the plan and has no further questions at this time.   Labs Review Labs Reviewed  URINALYSIS, ROUTINE W REFLEX MICROSCOPIC - Abnormal; Notable for the following:    Glucose, UA 100 (*)    Urobilinogen, UA 2.0 (*)    All other components within normal limits  CBC WITH DIFFERENTIAL/PLATELET - Abnormal; Notable for the following:    Platelets 127 (*)    Monocytes Relative 14 (*)    All other components within normal limits  COMPREHENSIVE METABOLIC PANEL - Abnormal; Notable for the following:    Glucose, Bld 109 (*)    AST 41 (*)    Total Bilirubin 1.3 (*)    GFR calc non Af Amer 88 (*)    All other components within normal limits  AMMONIA - Abnormal; Notable for the following:     Ammonia 64 (*)    All other components within normal limits  URINE RAPID DRUG SCREEN (HOSP PERFORMED) - Abnormal; Notable for the following:    Benzodiazepines POSITIVE (*)    All other components within normal limits  AMMONIA - Abnormal; Notable for the following:    Ammonia 113 (*)    All other components within normal limits  CBC - Abnormal; Notable for the following:    RBC 3.82 (*)    Hemoglobin 11.7 (*)    HCT 34.9 (*)    Platelets 102 (*)    All other components within normal limits  CREATININE, SERUM - Abnormal; Notable for the following:    GFR calc non Af Amer 87 (*)    All other components within normal limits  GLUCOSE, CAPILLARY - Abnormal; Notable for the following:    Glucose-Capillary 135 (*)    All other components within normal limits  GLUCOSE, CAPILLARY - Abnormal; Notable for the following:    Glucose-Capillary 153 (*)    All other components within normal limits  I-STAT ARTERIAL BLOOD GAS, ED - Abnormal; Notable for the following:    pO2, Arterial 70.0 (*)    Bicarbonate 24.5 (*)    All other components within normal limits  ETHANOL  RPR  TSH  VITAMIN B12  FOLATE  HIV ANTIBODY (ROUTINE TESTING)  LACTIC ACID, PLASMA  PROCALCITONIN    Imaging Review Dg Chest 2 View  10/15/2014   CLINICAL DATA:  Cough x3 years, altered mental status  EXAM: CHEST  2 VIEW  COMPARISON:  05/31/2014  FINDINGS: Chronic interstitial markings/ emphysematous changes. No focal consolidation. No pleural effusion or pneumothorax.  The heart is normal in size.  Visualized osseous structures are within normal limits.  IMPRESSION: No active cardiopulmonary disease.   Electronically Signed   By: Julian Hy M.D.   On: 10/15/2014 18:49   Ct Head Wo Contrast  10/15/2014   CLINICAL DATA:  Confusion since last evening.  EXAM: CT HEAD WITHOUT CONTRAST  TECHNIQUE: Contiguous axial images were obtained from the base of the skull through the vertex without intravenous contrast.  COMPARISON:   05/10/2014  FINDINGS: The ventricles are in the midline without mass effect or shift. Stable septum cavum  pellucidum. Mild age related ventriculomegaly and cerebral atrophy. Patchy periventricular white matter disease. No acute intracranial findings or mass lesions.  No acute bony findings. There are surgical changes involving the left mandible. The mastoid air cells and middle ear cavities are grossly clear. There is a stable polyp or mucous retention cysts in the left maxillary sinus.  IMPRESSION: No acute intracranial findings or mass lesions.   Electronically Signed   By: Marijo Sanes M.D.   On: 10/15/2014 18:40   Mri Brain Without Contrast  10/16/2014   CLINICAL DATA:  Initial evaluation for acute altered mental status, confusion. Unsteady gait at baseline.  EXAM: MRI HEAD WITHOUT CONTRAST  TECHNIQUE: Multiplanar, multiecho pulse sequences of the brain and surrounding structures were obtained without intravenous contrast.  COMPARISON:  Prior CT from 10/15/2014  FINDINGS: Mild diffuse prominence of the CSF containing spaces compatible with generalized cerebral atrophy. Mild scattered T2/FLAIR hyperintensity within the periventricular and deep white matter both cerebral hemispheres most consistent with chronic small vessel ischemic changes. Fairly mild for patient age.  No abnormal foci of restricted diffusion to suggest acute intracranial infarct. Gray-white matter differentiation maintained. Normal intravascular flow voids preserved. No acute or chronic intracranial hemorrhage.  No mass lesion or midline shift. Mild ventricular prominence related to global parenchymal volume loss an cavum septum pellucidum noted. No extra-axial fluid collection.  Craniocervical junction within normal limits. Pituitary gland normal. No acute abnormality seen about the orbits.  Retention cyst noted within the left maxillary sinus. Paranasal sinuses are otherwise clear. No mastoid effusion.  Bone marrow signal intensity  normal. No scalp soft tissue abnormality.  IMPRESSION: 1. No MRI evidence for acute intracranial infarct or other abnormality identified. 2. Mild atrophy with chronic small vessel ischemic disease.   Electronically Signed   By: Jeannine Boga M.D.   On: 10/16/2014 06:59     EKG Interpretation None      MDM   Final diagnoses:  Altered mental status, unspecified altered mental status type    74 y.o. female with pertinent PMH of COPD presents with confusion as above. Patient has a history of similar events with prior UTI. She states that she attempted to call a cab with the TV remote. She does have good insight into her confusion. On arrival patient has vital signs and physical exam as above. Although the patient is oriented, at times she has tangential speech and appears mildly drowsy, difficult to extract proper history from. She is able to answer questions with significant delay. No focal neurologic deficits, no ataxia.    workup as above with hyperammonemia. CT head and chest x-ray unremarkable. Given the patient's altered mental status and hyperammonemia, will admit for altered mental status workup, potential MRI. Do not feel neurologic consultation warranted at this time given lack of focal neurologic symptoms. Also patient has had symptoms for over 24 hours, not candidate for emergent neurologic intervention. Admitted in stable condition. .    I have reviewed all laboratory and imaging studies if ordered as above  1. Altered mental status, unspecified altered mental status type   2. Acute encephalopathy          Ashley Freiberg, MD 10/16/14 1544

## 2014-10-15 NOTE — ED Notes (Addendum)
Pt sts she became confused last night. She sts that she believes she may have a UTI due to dark colored urine. Pt appears CAOx4. Pt has no noticeable deficits. She ambulates unassisted with a slightly unsteady gait. Pt sts she was attempting to call a cab to bring her here and was trying to call with her tv remote. She had to get a neighbor to call the cab for her. She sts that she has had mild confusion similar to this in the past due to a UTI.

## 2014-10-16 ENCOUNTER — Encounter (HOSPITAL_COMMUNITY): Payer: Self-pay | Admitting: Internal Medicine

## 2014-10-16 ENCOUNTER — Inpatient Hospital Stay (HOSPITAL_COMMUNITY): Payer: Medicare HMO

## 2014-10-16 DIAGNOSIS — Z720 Tobacco use: Secondary | ICD-10-CM | POA: Diagnosis not present

## 2014-10-16 DIAGNOSIS — G9340 Encephalopathy, unspecified: Secondary | ICD-10-CM | POA: Diagnosis not present

## 2014-10-16 DIAGNOSIS — E119 Type 2 diabetes mellitus without complications: Secondary | ICD-10-CM

## 2014-10-16 DIAGNOSIS — J441 Chronic obstructive pulmonary disease with (acute) exacerbation: Secondary | ICD-10-CM | POA: Diagnosis not present

## 2014-10-16 HISTORY — DX: Type 2 diabetes mellitus without complications: E11.9

## 2014-10-16 LAB — CBC
HCT: 34.9 % — ABNORMAL LOW (ref 36.0–46.0)
HEMOGLOBIN: 11.7 g/dL — AB (ref 12.0–15.0)
MCH: 30.6 pg (ref 26.0–34.0)
MCHC: 33.5 g/dL (ref 30.0–36.0)
MCV: 91.4 fL (ref 78.0–100.0)
Platelets: 102 10*3/uL — ABNORMAL LOW (ref 150–400)
RBC: 3.82 MIL/uL — AB (ref 3.87–5.11)
RDW: 14.8 % (ref 11.5–15.5)
WBC: 4.1 10*3/uL (ref 4.0–10.5)

## 2014-10-16 LAB — GLUCOSE, CAPILLARY
GLUCOSE-CAPILLARY: 120 mg/dL — AB (ref 70–99)
GLUCOSE-CAPILLARY: 158 mg/dL — AB (ref 70–99)
Glucose-Capillary: 135 mg/dL — ABNORMAL HIGH (ref 70–99)
Glucose-Capillary: 153 mg/dL — ABNORMAL HIGH (ref 70–99)

## 2014-10-16 LAB — CREATININE, SERUM
Creatinine, Ser: 0.66 mg/dL (ref 0.50–1.10)
GFR calc non Af Amer: 87 mL/min — ABNORMAL LOW (ref >90)

## 2014-10-16 LAB — FOLATE: Folate: >20 ng/mL

## 2014-10-16 LAB — PROCALCITONIN: Procalcitonin: <0.1 ng/mL

## 2014-10-16 LAB — TSH: TSH: 0.864 u[IU]/mL (ref 0.350–4.500)

## 2014-10-16 LAB — RPR: RPR: NONREACTIVE

## 2014-10-16 LAB — AMMONIA: Ammonia: 113 umol/L — ABNORMAL HIGH (ref 11–32)

## 2014-10-16 LAB — HIV ANTIBODY (ROUTINE TESTING W REFLEX): HIV Screen 4th Generation wRfx: NONREACTIVE

## 2014-10-16 LAB — LACTIC ACID, PLASMA: LACTIC ACID, VENOUS: 2.7 mmol/L — AB (ref 0.5–2.0)

## 2014-10-16 LAB — VITAMIN B12: Vitamin B-12: 644 pg/mL (ref 211–911)

## 2014-10-16 MED ORDER — ALBUTEROL SULFATE (2.5 MG/3ML) 0.083% IN NEBU
2.5000 mg | INHALATION_SOLUTION | RESPIRATORY_TRACT | Status: DC
  Administered 2014-10-16: 2.5 mg via RESPIRATORY_TRACT
  Filled 2014-10-16: qty 3

## 2014-10-16 MED ORDER — IPRATROPIUM-ALBUTEROL 0.5-2.5 (3) MG/3ML IN SOLN
3.0000 mL | Freq: Four times a day (QID) | RESPIRATORY_TRACT | Status: DC
  Administered 2014-10-16 – 2014-10-17 (×5): 3 mL via RESPIRATORY_TRACT
  Filled 2014-10-16 (×5): qty 3

## 2014-10-16 MED ORDER — ALBUTEROL SULFATE (2.5 MG/3ML) 0.083% IN NEBU: 2.5000 mg | INHALATION_SOLUTION | RESPIRATORY_TRACT | Status: DC | PRN

## 2014-10-16 MED ORDER — BUDESONIDE 0.25 MG/2ML IN SUSP
0.2500 mg | Freq: Two times a day (BID) | RESPIRATORY_TRACT | Status: DC
  Administered 2014-10-16 – 2014-10-18 (×5): 0.25 mg via RESPIRATORY_TRACT
  Filled 2014-10-16 (×7): qty 2

## 2014-10-16 MED ORDER — FUROSEMIDE 20 MG PO TABS
20.0000 mg | ORAL_TABLET | ORAL | Status: DC
  Administered 2014-10-17: 20 mg via ORAL
  Filled 2014-10-16: qty 1

## 2014-10-16 MED ORDER — PRAVASTATIN SODIUM 20 MG PO TABS
20.0000 mg | ORAL_TABLET | Freq: Every day | ORAL | Status: DC
  Administered 2014-10-16 – 2014-10-17 (×2): 20 mg via ORAL
  Filled 2014-10-16 (×3): qty 1

## 2014-10-16 MED ORDER — ENSURE ENLIVE PO LIQD: 237.0000 mL | Freq: Two times a day (BID) | ORAL | Status: DC

## 2014-10-16 MED ORDER — ENOXAPARIN SODIUM 40 MG/0.4ML ~~LOC~~ SOLN
40.0000 mg | Freq: Every day | SUBCUTANEOUS | Status: DC
  Administered 2014-10-16 – 2014-10-18 (×3): 40 mg via SUBCUTANEOUS
  Filled 2014-10-16 (×3): qty 0.4

## 2014-10-16 MED ORDER — SODIUM CHLORIDE 0.9 % IJ SOLN
3.0000 mL | Freq: Two times a day (BID) | INTRAMUSCULAR | Status: DC
  Administered 2014-10-16 – 2014-10-18 (×6): 3 mL via INTRAVENOUS

## 2014-10-16 MED ORDER — VITAMIN D3 25 MCG (1000 UNIT) PO TABS
2000.0000 [IU] | ORAL_TABLET | Freq: Every day | ORAL | Status: DC
  Administered 2014-10-16 – 2014-10-18 (×3): 2000 [IU] via ORAL
  Filled 2014-10-16 (×3): qty 2

## 2014-10-16 MED ORDER — PREDNISONE 50 MG PO TABS
50.0000 mg | ORAL_TABLET | Freq: Every day | ORAL | Status: DC
  Administered 2014-10-16 – 2014-10-17 (×2): 50 mg via ORAL
  Filled 2014-10-16 (×3): qty 1

## 2014-10-16 MED ORDER — IPRATROPIUM BROMIDE 0.02 % IN SOLN
0.5000 mg | RESPIRATORY_TRACT | Status: DC
  Administered 2014-10-16: 0.5 mg via RESPIRATORY_TRACT
  Filled 2014-10-16: qty 2.5

## 2014-10-16 MED ORDER — PANTOPRAZOLE SODIUM 40 MG PO TBEC
40.0000 mg | DELAYED_RELEASE_TABLET | Freq: Every day | ORAL | Status: DC
  Administered 2014-10-16 – 2014-10-18 (×3): 40 mg via ORAL
  Filled 2014-10-16 (×4): qty 1

## 2014-10-16 MED ORDER — DIAZEPAM 5 MG PO TABS: 5.0000 mg | ORAL_TABLET | Freq: Every day | ORAL | Status: DC | PRN

## 2014-10-16 MED ORDER — ASPIRIN EC 81 MG PO TBEC
81.0000 mg | DELAYED_RELEASE_TABLET | Freq: Every day | ORAL | Status: DC
  Administered 2014-10-16 – 2014-10-18 (×3): 81 mg via ORAL
  Filled 2014-10-16 (×3): qty 1

## 2014-10-16 MED ORDER — INSULIN ASPART 100 UNIT/ML ~~LOC~~ SOLN
0.0000 [IU] | Freq: Three times a day (TID) | SUBCUTANEOUS | Status: DC
  Administered 2014-10-16: 2 [IU] via SUBCUTANEOUS
  Administered 2014-10-16 – 2014-10-17 (×2): 1 [IU] via SUBCUTANEOUS
  Administered 2014-10-17: 2 [IU] via SUBCUTANEOUS
  Administered 2014-10-17 – 2014-10-18 (×2): 1 [IU] via SUBCUTANEOUS
  Administered 2014-10-18: 2 [IU] via SUBCUTANEOUS

## 2014-10-16 NOTE — Progress Notes (Signed)
EEG completed; results pending.    

## 2014-10-16 NOTE — Progress Notes (Signed)
CRITICAL VALUE ALERT  Critical value received: Lactic Acid 2.7  Date of notification:  10/16/2014  Time of notification:  6:15PM  Critical value read back:Yes.    Nurse who received alert:  Marguerite Olea  MD notified (1st page):  Doyle Askew  Time of first page: 6:20pm  MD notified (2nd page):  Time of second page:   Responding MD:  Doyle Askew  Time MD responded:  6:37pm

## 2014-10-16 NOTE — H&P (Signed)
[facility] History and Physical  Ashley Savage ZWC:[id number] DOB: 15-Sep-1940 DOA: 10/15/2014  Referring physician: Patient was transferred from [facility] [place]. PCP: Penni Homans, MD   Chief Complaint: Periods of confusion.  HPI: Ashley Savage is a 74 y.o. female with history of COPD, ongoing tobacco abuse, history of breast cancer status post radiation and mastectomy, diabetes mellitus type 2, dyslipidemia, chronic leg edema, diastolic dysfunction presented to the ER because of patient having periods of confusion. Patient states that night before last patient had brief episode where patient states she didn't know what was going on and felt very confused. Patient stated that she had similar confusion last October or November when she had UTI and cataract surgery. Patient was referred to neurologist and neurologist had recommended MRI brain but patient refused at that time. In the ER patient's ammonia level is minimally elevated. On May exam patient is alert awake oriented to time place and person. CT of the head is showing nothing acute. Patient denies having taken any new medications. Patient is nonfocal on exam. ABG does not show any carbon dioxide retention.  Review of Systems: As presented in the history of presenting illness, rest negative.  Past Medical History  Diagnosis Date  . Emphysema   . Diabetes mellitus type 2  . Hyperlipidemia   . Cancer breast ca  right  . Anxiety   . Panic attacks   . Depression   . Arthritis of both knees 10/01/2013  . Benign paroxysmal positional vertigo 10/01/2013  . Neck pain 10/01/2013  . Esophageal reflux 10/01/2013  . Pedal edema 12/10/2013  . Overactive bladder 12/10/2013  . Tobacco abuse disorder 02/01/2014  . COPD (chronic obstructive pulmonary disease) 10/01/2013   Past Surgical History  Procedure Laterality Date  . Gallbladder surgery  1992  . Breast surgery  2009 right  . Appendectomy  2007  . Knee surgery    . Mandible fracture  surgery    . Pilonidal cyst excision    . Tonsillectomy    . Cataract extraction     Social History:  reports that she has been smoking Cigarettes.  She started smoking about 50 years ago. She has been smoking about 0.05 packs per day. She has never used smokeless tobacco. She reports that she does not drink alcohol or use illicit drugs. Where does patient live at home. Can patient participate in ADLs? Yes.  Allergies  Allergen Reactions  . Citalopram     Confusion, irregular heart beat.  . Erythromycin     Stomach cramps    Family History:  Family History  Problem Relation Age of Onset  . Heart failure Father   . COPD Father   . Arthritis Father 17  . Pneumonia Sister   . Breast cancer    . Stroke Mother   . Arthritis Mother 37  . Hyperlipidemia Mother   . Hypertension Mother   . Diabetes Mother   . Breast cancer Maternal Aunt   . Alcohol abuse Maternal Uncle       Prior to Admission medications   Medication Sig Start Date End Date Taking? Authorizing Provider  albuterol (PROVENTIL HFA;VENTOLIN HFA) 108 (90 BASE) MCG/ACT inhaler Inhale 2 puffs into the lungs every 6 (six) hours as needed for wheezing or shortness of breath. Only dispense Ventolin 09/26/13   Mosie Lukes, MD  albuterol (PROVENTIL) (2.5 MG/3ML) 0.083% nebulizer solution Inhale one vial via nebulizer four times daily as needed    Elsie Stain,  MD  aspirin EC 81 MG tablet Take 1 tablet (81 mg total) by mouth daily. 06/27/14   Sueanne Margarita, MD  Blood Glucose Monitoring Suppl (ONE TOUCH ULTRA SYSTEM KIT) W/DEVICE KIT 1 kit by Does not apply route once.    Historical Provider, MD  Cholecalciferol (VITAMIN D3) 2000 UNITS TABS Take 1 capsule by mouth daily.     Historical Provider, MD  diazepam (VALIUM) 5 MG tablet Take 1 tablet (5 mg total) by mouth daily as needed for anxiety or muscle spasms. 1 daily as needed Patient taking differently: Take 5 mg by mouth daily as needed for anxiety or muscle spasms. Takes  1/2-1 tablets as needed for anxiety 12/08/13   Mosie Lukes, MD  Fluticasone-Salmeterol (ADVAIR) 250-50 MCG/DOSE AEPB Inhale 1 puff into the lungs 2 (two) times daily.    Historical Provider, MD  furosemide (LASIX) 20 MG tablet Take 1 tablet (20 mg total) by mouth every Monday, Wednesday, and Friday. 08/01/14   Liliane Shi, PA-C  lovastatin (MEVACOR) 20 MG tablet Take 1 tablet (20 mg total) by mouth at bedtime. 20 mg 1 by mouth daily 10/23/13   Mosie Lukes, MD  metFORMIN (GLUCOPHAGE) 500 MG tablet TAKE 2 TABLETS (1,000 MG TOTAL) BY MOUTH 2 (TWO) TIMES DAILY WITH A MEAL. 08/28/14   Mosie Lukes, MD  omeprazole (PRILOSEC) 20 MG capsule Take 1 capsule (20 mg total) by mouth daily. 10/04/14   Mosie Lukes, MD  ONE TOUCH ULTRA TEST test strip by Other route as needed.  11/12/13   Historical Provider, MD  Respiratory Therapy Supplies (FLUTTER) DEVI Use 3-4 times daily as needed 12/14/13   Elsie Stain, MD    Physical Exam: Filed Vitals:   10/15/14 1645 10/15/14 1928 10/15/14 2039 10/15/14 2225  BP: 141/61 140/47  126/55  Pulse: 90 91  89  Temp: 98 F (36.7 C) 98 F (36.7 C)  97.7 F (36.5 C)  TempSrc: Oral Oral  Oral  Resp: '18 20 27 16  ' Height: 5' 6.5" (1.689 m)   5' 6.5" (1.689 m)  Weight: 65.772 kg (145 lb)   65.59 kg (144 lb 9.6 oz)  SpO2: 95% 95%  94%     General:  Well built and nourished.  Eyes: Anicteric no pallor.  ENT: No discharge from the ears eyes nose or mouth.  Neck: No mass felt.  Cardiovascular: S1 and S2 heard.  Respiratory: Bilateral expiratory wheezes heard no crepitations.  Abdomen: Soft nontender bowel sounds present.  Skin: No rash.  Musculoskeletal: No edema.  Psychiatric: Appears normal.  Neurologic: Alert awake oriented to time place and person. Moves all extremities.  Labs on Admission:  Basic Metabolic Panel:  Recent Labs Lab 10/15/14 1800  NA 142  K 4.2  CL 105  CO2 28  GLUCOSE 109*  BUN 15  CREATININE 0.64  CALCIUM 9.5    Liver Function Tests:  Recent Labs Lab 10/15/14 1800  AST 41*  ALT 28  ALKPHOS 98  BILITOT 1.3*  PROT 6.6  ALBUMIN 3.7   No results for input(s): LIPASE, AMYLASE in the last 168 hours.  Recent Labs Lab 10/15/14 1800  AMMONIA 64*   CBC:  Recent Labs Lab 10/15/14 1800  WBC 5.0  NEUTROABS 2.7  HGB 13.5  HCT 40.5  MCV 92.0  PLT 127*   Cardiac Enzymes: No results for input(s): CKTOTAL, CKMB, CKMBINDEX, TROPONINI in the last 168 hours.  BNP (last 3 results) No results for input(s): BNP  in the last 8760 hours.  ProBNP (last 3 results) No results for input(s): PROBNP in the last 8760 hours.  CBG: No results for input(s): GLUCAP in the last 168 hours.  Radiological Exams on Admission: Dg Chest 2 View  10/15/2014   CLINICAL DATA:  Cough x3 years, altered mental status  EXAM: CHEST  2 VIEW  COMPARISON:  05/31/2014  FINDINGS: Chronic interstitial markings/ emphysematous changes. No focal consolidation. No pleural effusion or pneumothorax.  The heart is normal in size.  Visualized osseous structures are within normal limits.  IMPRESSION: No active cardiopulmonary disease.   Electronically Signed   By: Julian Hy M.D.   On: 10/15/2014 18:49   Ct Head Wo Contrast  10/15/2014   CLINICAL DATA:  Confusion since last evening.  EXAM: CT HEAD WITHOUT CONTRAST  TECHNIQUE: Contiguous axial images were obtained from the base of the skull through the vertex without intravenous contrast.  COMPARISON:  05/10/2014  FINDINGS: The ventricles are in the midline without mass effect or shift. Stable septum cavum pellucidum. Mild age related ventriculomegaly and cerebral atrophy. Patchy periventricular white matter disease. No acute intracranial findings or mass lesions.  No acute bony findings. There are surgical changes involving the left mandible. The mastoid air cells and middle ear cavities are grossly clear. There is a stable polyp or mucous retention cysts in the left maxillary sinus.   IMPRESSION: No acute intracranial findings or mass lesions.   Electronically Signed   By: Marijo Sanes M.D.   On: 10/15/2014 18:40     Assessment/Plan Principal Problem:   Acute encephalopathy Active Problems:   Abdominal aortic aneurysm   Tobacco abuse disorder   COPD exacerbation   Diabetes mellitus type 2, controlled   1. Acute encephalopathy/delirium with periods of confusion - patient is essentially normal at this time. Ammonia level is mildly elevated. Recheck levels and if still elevated may start lactulose. Check MRI brain and EEG and check TSH RPR B 12 and folate levels. 2. COPD exacerbation - patient is actively wheezing on exam. I have placed patient on Pulmicort and nebulizer and if continues to wheeze may need IV steroids. 3. History of diastolic CHF last EF measured was in October 2015 60-65% - appears compensated. Patient takes Lasix alternate days. On weekdays. 4. Tobacco abuse - patient advised to quit tobacco. 5. Diabetes mellitus type 2 - on insulin sliding scale. 6. History of abdominal aortic aneurysm - denies any pain.   DVT Prophylaxis Lovenox.  Code Status: Full code.  Family Communication: None.  Disposition Plan: Admit to inpatient.    Janasha Barkalow N. [facility] Pager [phone].  If 7PM-7AM, please contact night-coverage [email or web address] Password [email or web address] 10/16/2014, 1:02 AM

## 2014-10-16 NOTE — Progress Notes (Addendum)
[name], female   DOB: 12/25/1940, 74 y.o.   MRN: [id number]  [facility] PROGRESS NOTE  PEREL HAUSCHILD OLM:[id number] DOB: 05/30/1941 DOA: 10/15/2014 PCP: Penni Homans, MD   Brief narrative:    74 y.o. female with COPD, ongoing tobacco abuse, history of breast cancer status post radiation and mastectomy, diabetes mellitus type 2, HLD, chronic leg edema, diastolic CHF, presented to ED for evaluation of intermittent episodes of worsening confusion. Pt has denied fevers, chills, abdominal or urinary concerns. Neurology team has been consulted.   Assessment/Plan:    Principal Problem:   Acute encephalopathy - mental status stable this AM - no clear etiology elicited for confusion - ? alcohol use, pt denies but AST > ALT in 2:1 pattern and ammonia level mildly elevated on admission - EEG with normal pattern, MRI and CT head with no acute abnormalities to explain confusion episodes  - PT evaluation once pt able to participate  - repeat ammonia level in AM Active Problems:   SIRS - pt with RR 27 bpm, initial BP 94/77 - unclear etiology at this time - UA unrevealing, CXR with no signs of an infectious etiology - will check PCT and lactic acid    Acute Transaminitis - unclear etiology, ? Alcohol use - will repeat in AM   Acute COPD exacerbation - still with mild wheezing on exam this AM - pt continues to smoke at home - continue BD's scheduled and as needed - continue Prednisone and taper down as clinically indicated  - provide nicotine patch - consulted in cessation    Chronic diastolic CHF - stable at this time - continue home medical regimen - weight on admission 65 kg, monitor daily weights    Abdominal aortic aneurysm - outpatient follow up - smoking cessation and BP control discussed   Diabetes mellitus type 2, controlled - continue on SSI for now   Thrombocytopenia - could be from alcohol induced bone marrow damage - no signs of active bleeding  -  repeat CBC in AM   Moderate PCM - in the context of chronic illnesses, COPD, ? Alcohol use - advance diet as pt able to tolerate, nutritionist consultation    HTN - reasonable inpatient control     DVT prophylaxis - Lovenox SQ  Code Status: Full.  Family Communication:  plan of care discussed with the patient Disposition Plan: Home when stable.   IV access:  Peripheral IV  Procedures and diagnostic studies:    Dg Chest 2 View  10/15/2014   No active cardiopulmonary disease.     Ct Head Wo Contrast  10/15/2014  No acute intracranial findings or mass lesions.   Mri Brain Without Contrast  10/16/2014  No MRI evidence for acute intracranial infarct or other abnormality identified.  Mild atrophy with chronic small vessel ischemic disease.    Medical Consultants:  None   Other Consultants:  PT/OT  IAnti-Infectives:   None  Faye Ramsay, MD  [facility] Pager [phone]  If 7PM-7AM, please contact night-coverage [email or web address] Password [facility] 10/16/2014, 2:57 PM   LOS: 0 days   HPI/Subjective: No events overnight.   Objective: Filed Vitals:   10/16/14 0616 10/16/14 0737 10/16/14 1115 10/16/14 1159  BP: 121/46  112/42   Pulse:   72   Temp: 97.9 F (36.6 C)  98 F (36.7 C)   TempSrc: Oral  Oral   Resp: 20  26   Height:      Weight:  SpO2: 92% 91% 96% 93%    Intake/Output Summary (Last 24 hours) at 10/16/14 1457 Last data filed at 10/16/14 1030  Gross per 24 hour  Intake    490 ml  Output    300 ml  Net    190 ml    Exam:   General:  Pt follows commands appropriately, not in acute distress  Cardiovascular: Regular rate and rhythm, no rubs, no gallops  Respiratory: Clear to auscultation bilaterally, mild exp wheezing and diminished breath sounds at bases   Abdomen: Soft, non tender, non distended, bowel sounds present, no guarding  Extremities: No edema, pulses DP and PT palpable bilaterally  Neuro: somewhat somnolent but easy to arouse, follows  commands appropriately   Data Reviewed: Basic Metabolic Panel:  Recent Labs Lab 10/15/14 1800 10/16/14 0620  NA 142  --   K 4.2  --   CL 105  --   CO2 28  --   GLUCOSE 109*  --   BUN 15  --   CREATININE 0.64 0.66  CALCIUM 9.5  --    Liver Function Tests:  Recent Labs Lab 10/15/14 1800  AST 41*  ALT 28  ALKPHOS 98  BILITOT 1.3*  PROT 6.6  ALBUMIN 3.7    Recent Labs Lab 10/15/14 1800 10/16/14 0650  AMMONIA 64* 113*   CBC:  Recent Labs Lab 10/15/14 1800 10/16/14 0620  WBC 5.0 4.1  NEUTROABS 2.7  --   HGB 13.5 11.7*  HCT 40.5 34.9*  MCV 92.0 91.4  PLT 127* 102*    CBG:  Recent Labs Lab 10/16/14 0840 10/16/14 1157  GLUCAP 135* 153*   Scheduled Meds: . aspirin EC  81 mg Oral Daily  . budesonide  0.25 mg Nebulization BID  . cholecalciferol  2,000 Units Oral Daily  . enoxaparin  injection  40 mg Subcutaneous Daily  . furosemide  20 mg Oral Q M,W,F  . insulin aspart  0-9 Units Subcutaneous TID WC  . ipratropium-albuterol  3 mL Nebulization QID  . pantoprazole  40 mg Oral Daily  . pravastatin  20 mg Oral q1800   Continuous Infusions:

## 2014-10-16 NOTE — Progress Notes (Signed)
Hal Hope, MD ordered for RN to perform a stroke swallow screen and stated "give a sandwich or something to eat once she passes swallow screen". Pt passed swallow screen. Will continue to monitor patient.

## 2014-10-16 NOTE — Procedures (Signed)
EEG report.  Brief clinical history: 74 y.o. female with PMHx of DM, emphysema, HLD, CA, COPD who presents to the Emergency Department complaining of AMS since last night. Pt states she has felt confused and unsteady since onset. She states she felt stressed and upset prior to onset  Technique: this is a 17 channel routine scalp EEG performed at the bedside with bipolar and monopolar montages arranged in accordance to the international 10/20 system of electrode placement. One channel was dedicated to EKG recording.  The study was performed during wakefulness, drowsiness, and stage 2 sleep. No activating procedures performed.  Description:In the wakeful state, the best background consisted of a medium amplitude, posterior dominant, well sustained, symmetric and reactive 8 Hz rhythm. Drowsiness demonstrated dropout of the alpha rhythm. Stage 2 sleep showed symmetric and synchronous sleep spindles without intermixed epileptiform discharges. No focal or generalized epileptiform discharges noted.  No pathologic areas of slowing seen.  EKG showed sinus rhythm.  Impression: this is a normal awake and asleep EEG. Please, be aware that a normal EEG does not exclude the possibility of epilepsy.  Clinical correlation is advised.  Dorian Pod, MD

## 2014-10-16 NOTE — Evaluation (Signed)
Physical Therapy Evaluation Patient Details Name: Ashley Savage MRN: [id number] DOB: [birth year] Today's Date: 10/16/2014   History of Present Illness  Ashley Savage is a 74 y.o. female with history of COPD, ongoing tobacco abuse, history of breast cancer status post radiation and mastectomy, diabetes mellitus type 2, dyslipidemia, chronic leg edema, diastolic dysfunction presented to the ER because of patient having periods of confusion. Patient states that night before last patient had brief episode where patient states she didn't know what was going on and felt very confused. Patient stated that she had similar confusion last October or November when she had UTI and cataract surgery. Patient was referred to neurologist and neurologist had recommended MRI brain but patient refused at that time. In the ER patient's ammonia level is minimally elevated.  Clinical Impression  Pt admitted with/for experiencing periods of confusion.  Work up continues   Pt currently limited functionally due to the problems listed below.  (see problems list.)  Pt will benefit from PT to maximize function and safety to be able to get home safely with limited available assist.     Follow Up Recommendations Home health PT    Equipment Recommendations  None recommended by PT    Recommendations for Other Services       Precautions / Restrictions Precautions Precautions: Fall            Mobility  Bed Mobility               General bed mobility comments: already OOB  Transfers Overall transfer level: Needs assistance Equipment used: None Transfers: Sit to/from Stand Sit to Stand: Supervision         General transfer comment: safe transition  Ambulation/Gait Ambulation/Gait assistance: Supervision Ambulation Distance (Feet): 200 Feet Assistive device: None Gait Pattern/deviations: Step-through pattern Gait velocity: slower   General Gait Details: mildly unsteady with some wobble, but no  LOB  Stairs            Wheelchair Mobility    Modified Rankin (Stroke Patients Only)       Balance Overall balance assessment: Needs assistance Sitting-balance support: No upper extremity supported Sitting balance-Leahy Scale: Good     Standing balance support: No upper extremity supported Standing balance-Leahy Scale: Fair                               Pertinent Vitals/Pain Pain Assessment: Faces Faces Pain Scale: Hurts little more Pain Location: R knee pain Pain Descriptors / Indicators: Aching;Grimacing Pain Intervention(s): Monitored during session    Home Living Family/patient expects to be discharged to:: Private residence Living Arrangements: Alone Available Help at Discharge: Friend(s);Available PRN/intermittently Type of Home: Apartment Home Access: Level entry     Home Layout: One level Home Equipment: Walker - 4 wheels;Shower seat      Prior Function Level of Independence: Independent               Hand Dominance        Extremity/Trunk Assessment               Lower Extremity Assessment: Overall WFL for tasks assessed;Generalized weakness;RLE deficits/detail;LLE deficits/detail         Communication   Communication: No difficulties  Cognition Arousal/Alertness: Awake/alert Behavior During Therapy: WFL for tasks assessed/performed Overall Cognitive Status: Within Functional Limits for tasks assessed  General Comments General comments (skin integrity, edema, etc.): pt relates that her gait in a little unsteady with neuropathy, but this is her usual "normal"    Exercises        Assessment/Plan    PT Assessment All further PT needs can be met in the next venue of care  PT Diagnosis Abnormality of gait;Generalized weakness   PT Problem List Decreased strength;Decreased activity tolerance;Decreased balance;Decreased mobility  PT Treatment Interventions     PT Goals (Current  goals can be found in the Care Plan section) Acute Rehab PT Goals PT Goal Formulation: All assessment and education complete, DC therapy    Frequency     Barriers to discharge        Co-evaluation               End of Session   Activity Tolerance: Patient tolerated treatment well Patient left: in chair;with call bell/phone within reach Nurse Communication: Mobility status         Time: 1316-1350 PT Time Calculation (min) (ACUTE ONLY): 34 min   Charges:   PT Evaluation $Initial PT Evaluation Tier I: 1 Procedure PT Treatments $Gait Training: 8-22 mins   PT G Codes:        ,  V 10/16/2014, 2:53 PM 10/16/2014  Ken , PT [phone]  (pager) 

## 2014-10-17 ENCOUNTER — Observation Stay (HOSPITAL_COMMUNITY): Payer: Medicare HMO

## 2014-10-17 DIAGNOSIS — G9340 Encephalopathy, unspecified: Secondary | ICD-10-CM | POA: Diagnosis not present

## 2014-10-17 LAB — COMPREHENSIVE METABOLIC PANEL
ALBUMIN: 3 g/dL — AB (ref 3.5–5.2)
ALK PHOS: 85 U/L (ref 39–117)
ALT: 25 U/L (ref 0–35)
AST: 37 U/L (ref 0–37)
Anion gap: 6 (ref 5–15)
BUN: 17 mg/dL (ref 6–23)
CHLORIDE: 106 mmol/L (ref 96–112)
CO2: 26 mmol/L (ref 19–32)
Calcium: 9.1 mg/dL (ref 8.4–10.5)
Creatinine, Ser: 0.78 mg/dL (ref 0.50–1.10)
GFR calc Af Amer: >90 mL/min (ref >90)
GFR calc non Af Amer: 82 mL/min — ABNORMAL LOW (ref >90)
GLUCOSE: 163 mg/dL — AB (ref 70–99)
POTASSIUM: 4.6 mmol/L (ref 3.5–5.1)
SODIUM: 138 mmol/L (ref 135–145)
Total Bilirubin: 1.6 mg/dL — ABNORMAL HIGH (ref 0.3–1.2)
Total Protein: 5.7 g/dL — ABNORMAL LOW (ref 6.0–8.3)

## 2014-10-17 LAB — CBC
HEMATOCRIT: 38.1 % (ref 36.0–46.0)
Hemoglobin: 12.7 g/dL (ref 12.0–15.0)
MCH: 30.1 pg (ref 26.0–34.0)
MCHC: 33.3 g/dL (ref 30.0–36.0)
MCV: 90.3 fL (ref 78.0–100.0)
Platelets: 107 10*3/uL — ABNORMAL LOW (ref 150–400)
RBC: 4.22 MIL/uL (ref 3.87–5.11)
RDW: 14.5 % (ref 11.5–15.5)
WBC: 4.5 10*3/uL (ref 4.0–10.5)

## 2014-10-17 LAB — GLUCOSE, CAPILLARY
GLUCOSE-CAPILLARY: 140 mg/dL — AB (ref 70–99)
GLUCOSE-CAPILLARY: 142 mg/dL — AB (ref 70–99)
Glucose-Capillary: 165 mg/dL — ABNORMAL HIGH (ref 70–99)

## 2014-10-17 LAB — AMMONIA: Ammonia: 79 umol/L — ABNORMAL HIGH (ref 11–32)

## 2014-10-17 LAB — LACTIC ACID, PLASMA: LACTIC ACID, VENOUS: 2.2 mmol/L — AB (ref 0.5–2.0)

## 2014-10-17 MED ORDER — PREDNISONE 20 MG PO TABS
40.0000 mg | ORAL_TABLET | Freq: Every day | ORAL | Status: DC
  Administered 2014-10-18: 40 mg via ORAL
  Filled 2014-10-17 (×2): qty 2

## 2014-10-17 MED ORDER — LACTULOSE 10 GM/15ML PO SOLN
20.0000 g | Freq: Two times a day (BID) | ORAL | Status: DC
  Administered 2014-10-17 (×2): 20 g via ORAL
  Filled 2014-10-17 (×4): qty 30

## 2014-10-17 MED ORDER — ALBUTEROL SULFATE (2.5 MG/3ML) 0.083% IN NEBU: 2.5000 mg | INHALATION_SOLUTION | RESPIRATORY_TRACT | Status: DC | PRN

## 2014-10-17 MED ORDER — IPRATROPIUM-ALBUTEROL 0.5-2.5 (3) MG/3ML IN SOLN
3.0000 mL | Freq: Two times a day (BID) | RESPIRATORY_TRACT | Status: DC
  Administered 2014-10-17 – 2014-10-18 (×2): 3 mL via RESPIRATORY_TRACT
  Filled 2014-10-17 (×2): qty 3

## 2014-10-17 NOTE — Progress Notes (Signed)
CARE MANAGEMENT NOTE 10/17/2014  Patient:  Ashley Savage, Ashley Savage   Account Number:  [id number]  Date Initiated:  10/17/2014  Documentation initiated by:  [facility]  Subjective/Objective Assessment:   confusion     Action/Plan:   lives alone   Anticipated DC Date:     Anticipated DC Plan:  [place]  CM consult      Choice offered to / List presented to:             Status of service:  In process, will continue to follow Medicare Important Message given?  YES (If response is "NO", the following Medicare IM given date fields will be blank) Date Medicare IM given:  10/17/2014 Medicare IM given by:  [facility] Date Additional Medicare IM given:   Additional Medicare IM given by:    Discharge Disposition:    Per UR Regulation:    If discussed at Long Length of Stay Meetings, dates discussed:    Comments:  10/17/2014 1530 NCM spoke to pt and provided HHA list. Pt requesting [facility]. States she has RW, 3n1 and Rollator at home. Waiting final recommendations for home. Will need HH orders. Jonnie Finner RN CCM Case Mgmt phone [phone]

## 2014-10-17 NOTE — Evaluation (Signed)
Occupational Therapy Evaluation Patient Details Name: DAJANE VALLI MRN: [id number] DOB: January 24, 1941 Today's Date: 10/17/2014    History of Present Illness JENNIPHER WEATHERHOLTZ is a 74 y.o. female with history of COPD, ongoing tobacco abuse, history of breast cancer status post radiation and mastectomy, diabetes mellitus type 2, dyslipidemia, chronic leg edema, diastolic dysfunction presented to the ER because of patient having periods of confusion. Patient states that night before last patient had brief episode where patient states she didn't know what was going on and felt very confused. Patient stated that she had similar confusion last October or November when she had UTI and cataract surgery. Patient was referred to neurologist and neurologist had recommended MRI brain but patient refused at that time. In the ER patient's ammonia level is minimally elevated.   Clinical Impression   Pt currently supervision level for all selfcare tasks and functional transfers.  Feel she will benefit from acute care OT to help increase independence to modified independent level.  HHOT eval recommended for safety.    Follow Up Recommendations  Home health OT    Equipment Recommendations  None recommended by OT       Precautions / Restrictions Precautions Precautions: Fall Restrictions Weight Bearing Restrictions: No      Mobilityed Mobility B               General bed mobility comments: already OOB  Transfers Overall transfer level: Needs assistance   Transfers: Sit to/from Stand;Stand Pivot Transfers Sit to Stand: Supervision Stand pivot transfers: Supervision            Balance     Sitting balance-Leahy Scale: Good       Standing balance-Leahy Scale: Fair                              ADL Overall ADL's : Needs assistance/impaired Eating/Feeding: Independent;Supervision/ safety   Grooming: Standing;Supervision/safety   Upper Body Bathing: Set up;Sitting    Lower Body Bathing: Sit to/from stand;Supervison/ safety   Upper Body Dressing : Supervision/safety;Sitting   Lower Body Dressing: Supervision/safety;Sit to/from stand   Toilet Transfer: Supervision/safety;Ambulation   Toileting- Clothing Manipulation and Hygiene: Supervision/safety;Sit to/from stand       Functional mobility during ADLs: Supervision/safety General ADL Comments: Pt overall supervision for mobility overall.  Does exhibit some staggering and reports history of neuropathy in her feet.  Pt reports being lonely and not having anyone to talk to.  She did state she is trying to get involved in certain groups to help with this.  Therapist discussed silver sneakers program as well as community groups that she could get involved with.      Vision Vision Assessment?: No apparent visual deficits   Perception Perception Perception Tested?: No   Praxis Praxis Praxis tested?: Not tested    Pertinent Vitals/Pain Pain Assessment: No/denies pain     Hand Dominance Right   Extremity/Trunk Assessment Upper Extremity Assessment Upper Extremity Assessment: Overall WFL for tasks assessed   Lower Extremity Assessment Lower Extremity Assessment: Defer to PT evaluation   Cervical / Trunk Assessment Cervical / Trunk Assessment: Kyphotic   Communication Communication Communication: No difficulties   Cognition Arousal/Alertness: Awake/alert Behavior During Therapy: WFL for tasks assessed/performed Overall Cognitive Status: Within Functional Limits for tasks assessed  Home Living Family/patient expects to be discharged to:: Private residence Living Arrangements: Alone Available Help at Discharge: Friend(s);Available PRN/intermittently Type of Home: Apartment Home Access: Level entry     Home Layout: One level     Bathroom Shower/Tub: Tub/shower unit;Curtain   Biochemist, clinical: Standard Bathroom Accessibility: Yes How  Accessible: Accessible via walker (pt ordering 3 wheeled walker to get into and out of the bathroom) Home Equipment: Walker - 4 wheels;Shower seat;Grab bars - tub/shower          Prior Functioning/Environment Level of Independence: Independent             OT Diagnosis: Generalized weakness   OT Problem List: Impaired balance (sitting and/or standing);Decreased strength   OT Treatment/Interventions: Self-care/ADL training;Balance training;Patient/family education;Therapeutic activities;DME and/or AE instruction    OT Goals(Current goals can be found in the care plan section) Acute Rehab OT Goals Patient Stated Goal: Pt wants to get back to normal and get involved more in the community and get in touch with people. OT Goal Formulation: With patient Time For Goal Achievement: 10/24/14 Potential to Achieve Goals: Good  OT Frequency: Min 2X/week   Barriers to D/C: Inaccessible home environment             End of Session Nurse Communication: Mobility status  Activity Tolerance: Patient tolerated treatment well Patient left: in chair   Time: 0814-4818 OT Time Calculation (min): 30 min Charges:  OT General Charges $OT Visit: 1 Procedure OT Evaluation $Initial OT Evaluation Tier I: 1 Procedure OT Treatments $Self Care/Home Management : 23-37 mins G-Codes: OT G-codes **NOT FOR INPATIENT CLASS** Functional Assessment Tool Used: clinical judgement Functional Limitation: Carrying, moving and handling objects Carrying, Moving and Handling Objects Current Status (H6314): At least 1 percent but less than 20 percent impaired, limited or restricted Carrying, Moving and Handling Objects Goal Status (H7026): 0 percent impaired, limited or restricted  Ringo Sherod OTR/L 10/17/2014, 4:57 PM

## 2014-10-17 NOTE — Progress Notes (Signed)
[name], female   DOB: 03-Oct-1940, 74 y.o.   MRN: [id number]  [facility] PROGRESS NOTE  MEHLANI BLANKENBURG YJE:[id number] DOB: 1940/10/05 DOA: 10/15/2014 PCP: Penni Homans, MD   Brief narrative:    74 y.o. female with COPD, ongoing tobacco abuse, history of breast cancer status post radiation and mastectomy, diabetes mellitus type 2, HLD, chronic leg edema, diastolic CHF, presented to ED for evaluation of intermittent episodes of worsening confusion. Pt has denied fevers, chills, abdominal or urinary concerns. Neurology team has been consulted.   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 alcohol use - pt denies but AST > ALT in 2:1 pattern and ammonia level remains elevated though trending down since admission  - EEG with normal pattern, MRI and CT head with no acute abnormalities to explain confusion episodes  - place on lactulose for now  - repeat ammonia level in AM Active Problems:   SIRS - pt with RR 27 bpm, initial BP 94/77 - unclear etiology at this time, suspect this to be related to the above as no clear infectious etiology elicited  - UA unrevealing, CXR with no signs of an infectious etiology - lactic acid is trending down, will repeat again in AM   Acute Transaminitis - unclear etiology, ? Alcohol use - LFT's improving - abd [place] requested for hepatic evaluation    Acute COPD exacerbation with hypoxia, pO2 70 mmHg on ABG - still with minimal wheezing on exam this AM, oxygen saturations > 97% on 2 L O2 [place]  - pt continues to smoke at home - continue BD's scheduled and as needed - continue Prednisone tapering  - consulted on cessation    Chronic diastolic CHF - stable at this time - continue home medical regimen - weight on admission 65 kg and remains stable over the past 24 hours, monitor daily weights     Abdominal aortic aneurysm - outpatient follow up - smoking cessation and BP control discussed   Diabetes mellitus type 2, controlled - continue on SSI for now   Thrombocytopenia - could be from alcohol induced bone marrow damage - no signs of active bleeding  - repeat CBC in AM   Moderate PCM - in the context of chronic illnesses, COPD, ? Alcohol use - advance diet as pt able to tolerate, nutritionist consultation    HTN - reasonable inpatient control   DVT prophylaxis - Lovenox SQ  Code Status: Full.  Family Communication:  plan of care discussed with the patient Disposition Plan: Home when stable.   IV access:  Peripheral IV  Procedures and diagnostic studies:    Dg Chest 2 View  10/15/2014   No active cardiopulmonary disease.     Ct Head Wo Contrast  10/15/2014  No acute intracranial findings or mass lesions.   Mri Brain Without Contrast  10/16/2014  No MRI evidence for acute intracranial infarct or other abnormality identified.  Mild atrophy with chronic small vessel ischemic disease.    Medical Consultants:  None   Other Consultants:  PT/OT  IAnti-Infectives:   None  Faye Ramsay, MD  [facility] Pager [phone]  If 7PM-7AM, please contact night-coverage [email or web address] Password [facility] 10/17/2014, 3:56 PM   LOS: 1 day   HPI/Subjective: No events overnight.   Objective: Filed Vitals:   10/17/14 5027 10/17/14 0815 10/17/14 7412 10/17/14 1419  BP: 114/48  94/38 146/113  Pulse: 85  81   Temp: 98.2 F (36.8 C)  98.3 F (36.8 C) 98.4 F (36.9 C)  TempSrc: Oral  Oral Oral  Resp: 20  24 20   Height:      Weight:      SpO2: 99% 92% 91% 97%    Intake/Output Summary (Last 24 hours) at 10/17/14 1556 Last data filed at 10/17/14 1444  Gross per 24 hour  Intake    960 ml  Output    551 ml  Net    409 ml    Exam:   General:  Pt follows commands appropriately, not in acute distress  Cardiovascular: Regular rate and rhythm, no rubs, no  gallops  Respiratory: Clear to auscultation bilaterally, min exp wheezing and diminished breath sounds at bases   Abdomen: Soft, non tender, non distended, bowel sounds present, no guarding  Extremities: No edema, pulses DP and PT palpable bilaterally  Neuro: somewhat somnolent but easy to arouse, follows commands appropriately   Data Reviewed: Basic Metabolic Panel:  Recent Labs Lab 10/15/14 1800 10/16/14 0620 10/17/14 0605  NA 142  --  138  K 4.2  --  4.6  CL 105  --  106  CO2 28  --  26  GLUCOSE 109*  --  163*  BUN 15  --  17  CREATININE 0.64 0.66 0.78  CALCIUM 9.5  --  9.1   Liver Function Tests:  Recent Labs Lab 10/15/14 1800 10/17/14 0605  AST 41* 37  ALT 28 25  ALKPHOS 98 85  BILITOT 1.3* 1.6*  PROT 6.6 5.7*  ALBUMIN 3.7 3.0*    Recent Labs Lab 10/15/14 1800 10/16/14 0650 10/17/14 0605  AMMONIA 64* 113* 79*   CBC:  Recent Labs Lab 10/15/14 1800 10/16/14 0620 10/17/14 0605  WBC 5.0 4.1 4.5  NEUTROABS 2.7  --   --   HGB 13.5 11.7* 12.7  HCT 40.5 34.9* 38.1  MCV 92.0 91.4 90.3  PLT 127* 102* 107*    CBG:  Recent Labs Lab 10/16/14 1157 10/16/14 1714 10/16/14 2202 10/17/14 0808 10/17/14 1210  GLUCAP 153* 120* 158* 140* 142*   Scheduled Meds: . aspirin EC  81 mg Oral Daily  . budesonide  0.25 mg Nebulization BID  . cholecalciferol  2,000 Units Oral Daily  . enoxaparin  injection  40 mg Subcutaneous Daily  . furosemide  20 mg Oral Q M,W,F  . insulin aspart  0-9 Units Subcutaneous TID WC  . ipratropium-albuterol  3 mL Nebulization QID  . pantoprazole  40 mg Oral Daily  . pravastatin  20 mg Oral q1800   Continuous Infusions:

## 2014-10-17 NOTE — Progress Notes (Signed)
INITIAL NUTRITION ASSESSMENT  DOCUMENTATION CODES Per approved criteria  -Not Applicable   INTERVENTION: Continue to provide Ensure Enlive BID, each provides 350 kcal, 20 grams of protein and 180 ml of H20  Encourage PO intake  NUTRITION DIAGNOSIS: Inadequate oral intake related to depression/anxiety as evidenced by poor PO intake.   Goal: Pt to meet ./= 90% of estimated energy needs  Monitor:  PO intake, weight trends, labs  Reason for Assessment: Consult for assessment of nutrition requirements/status  74 y.o. female  Admitting Dx: Acute encephalopathy  ASSESSMENT: 74 y/o female with COPD, ongoing tobacco abuse, history of breast cancer s/p radiation and mastectomy, DMII, HLD, diastolic CHF, presented to ED for evaluation of intermittent episodes of worsening confusion. Pt denies fevers, chills, abdominal pain or urinary concerns.  Pt confirmed some recent wt loss r/t anxiety and depression. She reported that her appetite comes and goes. Weight history reports 5% wt loss in 3 months. She stated that she just doesn't eat when she's feeling lonely. Current PO intake is 100%.  Pt likes to eat eggs and omelettes for breakfast, ham sandwiches for lunch, and eats out for dinner. Talked with pt extensively about trying to eat three meals a day regularly. Pt acknowledged that she needs to keep a stable weight.  NFPE did not reveal any subcutaneous body fat or muscle mass depletion.  Continue Ensure BID, and encourage PO intake.  Labs and medications reviewed: Elevated glucose  Height: Ht Readings from Last 1 Encounters:  10/15/14 5' 6.5" (1.689 m)    Weight: Wt Readings from Last 1 Encounters:  10/17/14 143 lb 15.4 oz (65.3 kg)    Ideal Body Weight: 130 lb (59.1 kg)  % Ideal Body Weight: 110%  Wt Readings from Last 10 Encounters:  10/17/14 143 lb 15.4 oz (65.3 kg)  10/04/14 147 lb 2 oz (66.735 kg)  09/26/14 147 lb (66.679 kg)  08/15/14 149 lb (67.586 kg)  08/01/14  148 lb 12.8 oz (67.495 kg)  07/24/14 150 lb 3.2 oz (68.13 kg)  07/05/14 151 lb (68.493 kg)  07/02/14 154 lb 6.4 oz (70.035 kg)  06/28/14 152 lb (68.947 kg)  06/25/14 153 lb (69.4 kg)    Usual Body Weight: unknown  % Usual Body Weight: -  BMI:  Body mass index is 22.89 kg/(m^2). Normal  Estimated Nutritional Needs: Kcal: 1500-1700 Protein: 65-80 grams Fluid: >/= 1.5L daily  Skin: pedal edema  Diet Order: Diet heart healthy/carb modified  EDUCATION NEEDS: -No education needs identified at this time   Intake/Output Summary (Last 24 hours) at 10/17/14 0955 Last data filed at 10/17/14 0950  Gross per 24 hour  Intake   1160 ml  Output    700 ml  Net    460 ml    Last BM: pta  Labs:   Recent Labs Lab 10/15/14 1800 10/16/14 0620 10/17/14 0605  NA 142  --  138  K 4.2  --  4.6  CL 105  --  106  CO2 28  --  26  BUN 15  --  17  CREATININE 0.64 0.66 0.78  CALCIUM 9.5  --  9.1  GLUCOSE 109*  --  163*    CBG (last 3)   Recent Labs  10/16/14 1714 10/16/14 2202 10/17/14 0808  GLUCAP 120* 158* 140*    Scheduled Meds: . aspirin EC  81 mg Oral Daily  . budesonide (PULMICORT) nebulizer solution  0.25 mg Nebulization BID  . cholecalciferol  2,000 Units Oral Daily  .  enoxaparin (LOVENOX) injection  40 mg Subcutaneous Daily  . feeding supplement (ENSURE ENLIVE)  237 mL Oral BID BM  . furosemide  20 mg Oral Q M,W,F  . insulin aspart  0-9 Units Subcutaneous TID WC  . ipratropium-albuterol  3 mL Nebulization BID  . pantoprazole  40 mg Oral Daily  . pravastatin  20 mg Oral q1800  . predniSONE  50 mg Oral Q breakfast  . sodium chloride  3 mL Intravenous Q12H    Continuous Infusions:   Past Medical History  Diagnosis Date  . Emphysema   . Diabetes mellitus type 2  . Hyperlipidemia   . Cancer breast ca  right  . Anxiety   . Panic attacks   . Depression   . Arthritis of both knees 10/01/2013  . Benign paroxysmal positional vertigo 10/01/2013  . Neck pain  10/01/2013  . Esophageal reflux 10/01/2013  . Pedal edema 12/10/2013  . Overactive bladder 12/10/2013  . Tobacco abuse disorder 02/01/2014  . COPD (chronic obstructive pulmonary disease) 10/01/2013    Past Surgical History  Procedure Laterality Date  . Gallbladder surgery  1992  . Breast surgery  2009 right  . Appendectomy  2007  . Knee surgery    . Mandible fracture surgery    . Pilonidal cyst excision    . Tonsillectomy    . Cataract extraction      Wynona Dove, MS Dietetic Intern Pager: [phone]

## 2014-10-18 ENCOUNTER — Telehealth: Payer: Self-pay | Admitting: *Deleted

## 2014-10-18 DIAGNOSIS — G9340 Encephalopathy, unspecified: Secondary | ICD-10-CM | POA: Diagnosis not present

## 2014-10-18 LAB — GLUCOSE, CAPILLARY
GLUCOSE-CAPILLARY: 135 mg/dL — AB (ref 70–99)
GLUCOSE-CAPILLARY: 161 mg/dL — AB (ref 70–99)
GLUCOSE-CAPILLARY: 197 mg/dL — AB (ref 70–99)

## 2014-10-18 LAB — COMPREHENSIVE METABOLIC PANEL
ALBUMIN: 3.2 g/dL — AB (ref 3.5–5.2)
ALT: 25 U/L (ref 0–35)
AST: 35 U/L (ref 0–37)
Alkaline Phosphatase: 83 U/L (ref 39–117)
Anion gap: 11 (ref 5–15)
BUN: 19 mg/dL (ref 6–23)
CALCIUM: 9 mg/dL (ref 8.4–10.5)
CO2: 24 mmol/L (ref 19–32)
Chloride: 103 mmol/L (ref 96–112)
Creatinine, Ser: 0.67 mg/dL (ref 0.50–1.10)
GFR calc Af Amer: >90 mL/min (ref >90)
GFR, EST NON AFRICAN AMERICAN: 86 mL/min — AB (ref >90)
Glucose, Bld: 161 mg/dL — ABNORMAL HIGH (ref 70–99)
Potassium: 3.7 mmol/L (ref 3.5–5.1)
Sodium: 138 mmol/L (ref 135–145)
TOTAL PROTEIN: 5.9 g/dL — AB (ref 6.0–8.3)
Total Bilirubin: 1.6 mg/dL — ABNORMAL HIGH (ref 0.3–1.2)

## 2014-10-18 LAB — CBC
HCT: 38.6 % (ref 36.0–46.0)
HEMOGLOBIN: 12.7 g/dL (ref 12.0–15.0)
MCH: 30.2 pg (ref 26.0–34.0)
MCHC: 32.9 g/dL (ref 30.0–36.0)
MCV: 91.7 fL (ref 78.0–100.0)
PLATELETS: 108 10*3/uL — AB (ref 150–400)
RBC: 4.21 MIL/uL (ref 3.87–5.11)
RDW: 14.7 % (ref 11.5–15.5)
WBC: 7.4 10*3/uL (ref 4.0–10.5)

## 2014-10-18 LAB — AMMONIA: AMMONIA: 80 umol/L — AB (ref 11–32)

## 2014-10-18 LAB — LACTIC ACID, PLASMA: Lactic Acid, Venous: 2.4 mmol/L (ref 0.5–2.0)

## 2014-10-18 LAB — PROCALCITONIN: Procalcitonin: <0.1 ng/mL

## 2014-10-18 MED ORDER — LACTULOSE 10 GM/15ML PO SOLN
20.0000 g | Freq: Three times a day (TID) | ORAL | Status: DC
  Administered 2014-10-18 (×2): 20 g via ORAL
  Filled 2014-10-18 (×3): qty 30

## 2014-10-18 MED ORDER — IPRATROPIUM-ALBUTEROL 0.5-2.5 (3) MG/3ML IN SOLN: 3.0000 mL | RESPIRATORY_TRACT | Status: DC | PRN

## 2014-10-18 MED ORDER — LACTULOSE 10 GM/15ML PO SOLN: 20.0000 g | Freq: Three times a day (TID) | ORAL | Status: DC

## 2014-10-18 MED ORDER — PREDNISONE 10 MG PO TABS: ORAL_TABLET | ORAL | Status: DC

## 2014-10-18 NOTE — Care Management Note (Addendum)
    Page 1 of 1   10/18/2014     5:11:21 PM CARE MANAGEMENT NOTE 10/18/2014  Patient:  Ashley Savage, Ashley Savage   Account Number:  [id number]  Date Initiated:  10/17/2014  Documentation initiated by:  [facility]  Subjective/Objective Assessment:   confusion     Action/Plan:   lives alone   Anticipated DC Date:     Anticipated DC Plan:  [place]  CM consult      Choice offered to / List presented to:          [facility] arranged  [place] RN      [place].   Status of service:  Completed, signed off Medicare Important Message given?  YES (If response is "NO", the following Medicare IM given date fields will be blank) Date Medicare IM given:  10/17/2014 Medicare IM given by:  [facility] Date Additional Medicare IM given:   Additional Medicare IM given by:    Discharge Disposition:  [place]  Per UR Regulation:  Reviewed for med. necessity/level of care/duration of stay  If discussed at [place] of Stay Meetings, dates discussed:    Comments:  10/18/14 [place], BSN [phone] referral made to [facility], [place] notified for hhpt and hhrn. Notified MD to put order in for hhpt.  Soc will begin 24-48 hrs post dc.  10/17/2014 1530 NCM spoke to pt and provided HHA list. Pt requesting [facility]. States she has RW, 3n1 and Rollator at home. Waiting final recommendations for home. Will need HH orders. Jonnie Finner RN CCM Case Mgmt phone [phone]

## 2014-10-18 NOTE — Progress Notes (Signed)
CRITICAL VALUE ALERT  Critical value received:  Lactic acid 2.4  Date of notification:  10/18/14  Time of notification:  0845  Critical value read back:Yes.    Nurse who received alert:  Riccardo Dubin, RN  MD notified (1st page):  Doyle Askew  Time of first page:  0915   Responding MD:  Doyle Askew  Time MD responded:  [phone]

## 2014-10-18 NOTE — Discharge Instructions (Signed)
Hepatic Encephalopathy °Hepatic encephalopathy is a syndrome. This is a set of symptoms that occur together. It is seen mostly in patients with damage to the liver known as cirrhosis. This is where normal liver tissue has been replaced by scar tissue.  °Symptoms of the syndrome include: °· Changes in personality. °· Mental impairment. °· A depressed level of consciousness. °These changes occur because toxins build up in the bloodstream. The build up occurs because the scarred liver cannot rid toxins from the body. The most important of these toxins is ammonia. Toxins can cause abnormal behavior and confusion. Toxins in the blood stream can impair your ability to take care of yourself or others. Some people become very sleepy and cannot be woken easily. In severe cases, the patient lapses into a coma.  °CAUSES  °There are many things that can cause liver damage that can lead to buildup of toxins. These include: °· Diseases that cause cirrhosis of the liver. °· Long-term alcohol use with progressive liver damage. °· Hepatitis B or C with ongoing infection and liver damage. °· Patients without cirrhosis who have undergone shunt surgery. °· Kidney failure. °· Bleeding in the stomach or intestines. °· Infection. °· Constipation. °· Medications that act upon the central nervous system. °· Diuretic therapy. °· Excessive dietary protein. °SYMPTOMS  °Symptoms of this syndrome are categorized or "staged" based on severity.  °· Stage 0. Minimal hepatic encephalopathy. No detectable changes in personality or behavior. Minimal changes in memory, concentration, mental function, and physical ability. °· Stage 1. Some lack of awareness. Shortened attention span. Problems with addition or subtraction. Possible problems with sleeping or a reversal of the normal sleep pattern. Euphoria, depression, or irritability may be present. Mild confusion. Slowing of mental ability. Tremors may be detected. °· Stage 2. Lethargy or apathy.  Disoriented. Strange behavior. Slurred speech. Obvious tremors. Drowsiness, unable to perform mental tasks. Personality changes, and confusion about time. °· Stage 3. Very sleepy but can be aroused. Unable to perform mental tasks, cannot keep track of time and place, marked confusion, amnesia, occasional fits of rage, speech cannot be understood. °· Stage 4. Coma with or without response to painful stimuli. °DIAGNOSIS  °In mild cases, a careful history and physical exam may lead your caregiver to consider possible mild hepatic encephalopathy as the cause of symptoms. The diagnosis is clearer in more severe cases. An elevated blood ammonia level is the classic blood test abnormality in patients with this syndrome. Other tests can be helpful to rule out other diseases.  °TREATMENT  °· Medications are often used to lower the ammonia level in the blood. This usually leads to improvement. °· Diets containing vegetable proteins are better than diets rich in animal protein, especially proteins derived from red meats. Eating well-cooked chicken and fish in addition to vegetable protein should be discussed with your caregiver. Malnourished patients are encouraged to add liquid nutritional supplements to their diet. °· Antibiotics are sometimes used to try to lessen the volume of bacteria in the intestines that produce ammonia. °· Moderate to severe cases of this syndrome usually require a hospital stay and medicine that is given directly into a vein (intravenously). °HOME CARE INSTRUCTIONS  °The goal at home is to avoid things that can make the condition worse and lead to a buildup of ammonia in the blood. °· Eat a well balanced diet. Your caregiver can help you with suggestions on this. °· Talk to your caregiver before taking vitamin supplements. Large doses of vitamins and minerals,   especially vitamin A, iron, or copper, can worsen liver damage. °· A low salt diet, water restriction, or diuretic medicine may be needed to  reduce fluid retention. °· Avoid alcohol and acetaminophen as well as any over-the-counter medications that contain acetaminophen (check labels). Only take over-the-counter or prescription medicines for pain, discomfort, or fever as directed by your caregiver. °· Avoid drugs that are toxic to the liver. Review your medications (both prescription and non-prescription) with your caregiver to make sure those you are taking will not be harmful. °· Blood tests may be needed. Follow your caregiver's advice regarding the timing of these. °· With this condition you play a critical role in maintaining your own good health. The failure to follow your caregiver's advice and these instructions may result in permanent disability or death. °SEEK MEDICAL CARE IF:  °· You have increasing fatigue or weakness. °· You develop increasing swelling of the abdomen, hands, feet, legs or face. °· You develop loss of appetite. °· You are feeling sick to your stomach (nausea) and vomiting. °· You develop jaundice. This is a yellow discoloration of the skin. °· You develop worsening problems with concentration, confusion, and/or problems with sleep. °SEEK IMMEDIATE MEDICAL CARE IF:  °· You vomit bright red blood or a coffee ground-looking material. °· You have blood in your stools. Or the stools turn black and tarry. °· You have a fever. °· You develop easy bruising or bleeding. °· You have a return of slurred speech, change in behavior, or confusion. °MAKE SURE YOU:  °· Understand these instructions. °· Will watch your condition. °· Will get help right away if you are not doing well or get worse. °Document Released: 09/22/2006 Document Revised: 10/05/2011 Document Reviewed: 06/29/2007 °ExitCare® Patient Information ©2015 [employer]. This information is not intended to replace advice given to you by your health care provider. Make sure you discuss any questions you have with your health care provider. ° °

## 2014-10-18 NOTE — Discharge Summary (Signed)
Physician Discharge Summary  Ashley Savage:[id number] DOB: [birth year] DOA: 10/15/2014  PCP: Penni Homans, MD  Admit date: 10/15/2014 Discharge date: 10/18/2014  Recommendations for Outpatient Follow-up:  1. Pt will need to follow up with PCP in 2-3 weeks post discharge 2. Please obtain BMP to evaluate electrolytes and kidney function 3. Please also check CBC to evaluate Hg and Hct levels 4. Please note that pt was made aware of elevated ammonia level and need for further work up but she has insisted on going home and following with PCP regarding issue 5. Please check ammonia level on follow up   Discharge Diagnoses:  Principal Problem:   Acute encephalopathy Active Problems:   Abdominal aortic aneurysm   Tobacco abuse disorder   COPD exacerbation   Diabetes mellitus type 2, controlled  Discharge Condition: Stable  Diet recommendation: Heart healthy diet discussed in details    Brief narrative:    74 y.o. female with COPD, ongoing tobacco abuse, history of breast cancer status post radiation and mastectomy, diabetes mellitus type 2, HLD, chronic leg edema, diastolic CHF, presented to ED for evaluation of intermittent episodes of worsening confusion. Pt has denied fevers, chills, abdominal or urinary concerns. Neurology team has been consulted.   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 alcohol use but pt denies  - EEG with normal pattern, MRI and CT head with no acute abnormalities to explain confusion episodes  - placed on lactulose and ammonia level still elevated but down since admission - pt made aware and wants to go home and says she will follow up with PCP  Active Problems:  SIRS - pt with RR 27 bpm, initial BP 94/77 - unclear etiology at this time, suspect this to be related to the above as no clear infectious  etiology elicited  - UA unrevealing, CXR with no signs of an infectious etiology - lactic acid is trending down - since pt insisting on going home, will need close outpatient follow up and pt made aware   Acute Transaminitis - unclear etiology, ? Alcohol use - LFT's improving - abd [place] with ? Hepatic steatosis, no clear acute etiology noted   Acute COPD exacerbation with hypoxia, pO2 70 mmHg on ABG - no wheezing on exam this AM, oxygen saturations > 97% on RA - pt continues to smoke at home - continue BD's scheduled and as needed - continue Prednisone tapering  - counseled on cessation   Chronic diastolic CHF - stable at this time - continue home medical regimen - weight on admission 65 kg and remains stable over the past 24 hours  Abdominal aortic aneurysm - outpatient follow up - smoking cessation and BP control discussed  Diabetes mellitus type 2, controlled - continued on SSI while inpatient   Thrombocytopenia - could be from alcohol induced bone marrow damage - no signs of active bleeding   Moderate PCM - in the context of chronic illnesses, COPD - tolerated regular diet well   HTN - reasonable inpatient control   Code Status: Full.  Family Communication: plan of care discussed with the patient Disposition Plan: Home   IV access:  Peripheral IV  Procedures and diagnostic studies:   Dg Chest 2 View 10/15/2014 No active cardiopulmonary disease.   Ct Head Wo Contrast 10/15/2014 No acute intracranial findings or mass lesions.   Mri Brain Without  Contrast 10/16/2014 No MRI evidence for acute intracranial infarct or other abnormality identified. Mild atrophy with chronic small vessel ischemic disease.   Medical Consultants:  None   Other Consultants:  PT/OT  IAnti-Infectives:   None      Discharge Exam: Filed Vitals:   10/18/14 1436  BP: 114/48  Pulse: 77  Temp: 97.6 F (36.4 C)  Resp: 21   Filed Vitals:   10/17/14  2137 10/18/14 0518 10/18/14 0907 10/18/14 1436  BP:  105/45  114/48  Pulse:  77  77  Temp:  97.8 F (36.6 C)  97.6 F (36.4 C)  TempSrc:  Oral  Oral  Resp:  18  21  Height:      Weight:  65.998 kg (145 lb 8 oz)    SpO2: 93% 92% 91% 100%    General: Pt is alert, follows commands appropriately, not in acute distress Cardiovascular: Regular rate and rhythm, S1/S2 +, no murmurs, no rubs, no gallops Respiratory: Clear to auscultation bilaterally, no wheezing, no crackles, no rhonchi Abdominal: Soft, non tender, non distended, bowel sounds +, no guarding Extremities: no edema, no cyanosis, pulses palpable bilaterally DP and PT Neuro: Grossly nonfocal  Discharge Instructions  Discharge Instructions    Diet - low sodium heart healthy    Complete by:  As directed      Increase activity slowly    Complete by:  As directed             Medication List    TAKE these medications        albuterol 108 (90 BASE) MCG/ACT inhaler  Commonly known as:  PROVENTIL HFA;VENTOLIN HFA  Inhale 2 puffs into the lungs every 6 (six) hours as needed for wheezing or shortness of breath. Only dispense Ventolin     aspirin EC 81 MG tablet  Take 1 tablet (81 mg total) by mouth daily.     diazepam 5 MG tablet  Commonly known as:  VALIUM  Take 1 tablet (5 mg total) by mouth daily as needed for anxiety or muscle spasms. 1 daily as needed     Fluticasone-Salmeterol 250-50 MCG/DOSE Aepb  Commonly known as:  ADVAIR  Inhale 1 puff into the lungs 2 (two) times daily.     furosemide 20 MG tablet  Commonly known as:  LASIX  Take 1 tablet (20 mg total) by mouth every Monday, Wednesday, and Friday.     ipratropium-albuterol 0.5-2.5 (3) MG/3ML Soln  Commonly known as:  DUONEB  Take 3 mLs by nebulization every 4 (four) hours as needed.     lactulose 10 GM/15ML solution  Commonly known as:  CHRONULAC  Take 30 mLs (20 g total) by mouth 3 (three) times daily.     lovastatin 20 MG tablet  Commonly known as:   MEVACOR  Take 1 tablet (20 mg total) by mouth at bedtime. 20 mg 1 by mouth daily     metFORMIN 500 MG tablet  Commonly known as:  GLUCOPHAGE  TAKE 2 TABLETS (1,000 MG TOTAL) BY MOUTH 2 (TWO) TIMES DAILY WITH A MEAL.     omeprazole 20 MG capsule  Commonly known as:  PRILOSEC  Take 1 capsule (20 mg total) by mouth daily.     predniSONE 10 MG tablet  Commonly known as:  DELTASONE  Take 40 mg tablet today and taper down by 10 mg daily until completed     Vitamin D3 2000 UNITS Tabs  Take 1 capsule by mouth daily.  Follow-up Information    Follow up with Penni Homans, MD On 10/26/2014.   Specialty:  Family Medicine   Why:  appointment scheduled at 8:30 am on April 1st, 2016    Contact information:   [place] [address] [phone]       Follow up with Faye Ramsay, MD.   Specialty:  Internal Medicine   Why:  call my cell phone [phone]   Contact information:   [address] [place] [address] [phone]         The results of significant diagnostics from this hospitalization (including imaging, microbiology, ancillary and laboratory) are listed below for reference.     Microbiology: No results found for this or any previous visit (from the past 240 hour(s)).   Labs: Basic Metabolic Panel:  Recent Labs Lab 10/15/14 1800 10/16/14 0620 10/17/14 0605 10/18/14 0720  NA 142  --  138 138  K 4.2  --  4.6 3.7  CL 105  --  106 103  CO2 28  --  26 24  GLUCOSE 109*  --  163* 161*  BUN 15  --  17 19  CREATININE 0.64 0.66 0.78 0.67  CALCIUM 9.5  --  9.1 9.0   Liver Function Tests:  Recent Labs Lab 10/15/14 1800 10/17/14 0605 10/18/14 0720  AST 41* 37 35  ALT 28 25 25   ALKPHOS 98 85 83  BILITOT 1.3* 1.6* 1.6*  PROT 6.6 5.7* 5.9*  ALBUMIN 3.7 3.0* 3.2*    Recent Labs Lab 10/15/14 1800 10/16/14 0650 10/17/14 0605 10/18/14 0720  AMMONIA 64* 113* 79* 80*   CBC:  Recent Labs Lab  10/15/14 1800 10/16/14 0620 10/17/14 0605 10/18/14 0720  WBC 5.0 4.1 4.5 7.4  NEUTROABS 2.7  --   --   --   HGB 13.5 11.7* 12.7 12.7  HCT 40.5 34.9* 38.1 38.6  MCV 92.0 91.4 90.3 91.7  PLT 127* 102* 107* 108*    CBG:  Recent Labs Lab 10/17/14 1210 10/17/14 1732 10/17/14 2349 10/18/14 0818 10/18/14 1217  GLUCAP 142* 165* 161* 135* 197*   SIGNED: Time coordinating discharge: Over 30 minutes  Faye Ramsay, MD  [facility] 10/18/2014, 3:14 PM Pager [phone]  If 7PM-7AM, please contact night-coverage [email or web address] Password [email or web address]

## 2014-10-18 NOTE — Progress Notes (Signed)
UR completed 

## 2014-10-18 NOTE — Telephone Encounter (Signed)
Unable to reach patient at time of TCM call. Left message for patient to return call when available.   

## 2014-10-18 NOTE — Progress Notes (Signed)
Patient discharge teaching given, including activity, diet, follow-up appoints, and medications. Patient verbalized understanding of all discharge instructions. IV access was d/c'd. Vitals are stable. Skin is intact except as charted in most recent assessments. Pt to be escorted out by RN, to be driven home by family.  

## 2014-10-19 ENCOUNTER — Other Ambulatory Visit: Payer: Self-pay | Admitting: Family Medicine

## 2014-10-25 NOTE — Telephone Encounter (Addendum)
Transition Care Management Follow-up Telephone Call  How have you been since you were released from the hospital? "Fine, the confusion is gone, but the ammonia levels are still too high"   Do you understand why you were in the hospital? YES- "they think that my liver is sluggish and the toxins are building"   Do you understand the discharge instrcutions? YES - patient states they added lactulose to try to bring down ammonia levels   Items Reviewed:  Medications reviewed: YES -added lactulose and prednisone (tapered and completed)  Allergies reviewed: YES   Dietary changes reviewed: YES- no changes   Referrals reviewed: YES   Functional Questionnaire:   Activities of Daily Living (ADLs):   She states they are independent in the following: all States they require assistance with the following: patient will have [place], arranged for week of 10/29/14 per patient.    Any transportation issues/concerns?: NO- able to drive   Any patient concerns? States she was not told any results of the tests run in the hospital, would like to discuss results    Confirmed importance and date/time of follow-up visits scheduled: YES- patient scheduled appointment with Elyn Aquas, [place] for 10/26/14   Confirmed with patient if condition begins to worsen call PCP or go to the ER.  Patient was given the Call-a-Nurse line [phone]: YES

## 2014-10-25 NOTE — Addendum Note (Signed)
Addended by: Leticia Penna A on: 10/25/2014 09:43 AM   Modules accepted: Orders, Medications

## 2014-10-26 ENCOUNTER — Encounter: Payer: Self-pay | Admitting: Physician Assistant

## 2014-10-26 ENCOUNTER — Ambulatory Visit (INDEPENDENT_AMBULATORY_CARE_PROVIDER_SITE_OTHER): Payer: Medicare HMO | Admitting: Physician Assistant

## 2014-10-26 VITALS — BP 124/72 | HR 85 | Temp 98.2°F | Ht 65.0 in | Wt 149.1 lb

## 2014-10-26 DIAGNOSIS — G9340 Encephalopathy, unspecified: Secondary | ICD-10-CM

## 2014-10-26 DIAGNOSIS — F411 Generalized anxiety disorder: Secondary | ICD-10-CM

## 2014-10-26 LAB — COMPREHENSIVE METABOLIC PANEL
ALBUMIN: 3.3 g/dL — AB (ref 3.5–5.2)
ALT: 27 U/L (ref 0–35)
AST: 27 U/L (ref 0–37)
Alkaline Phosphatase: 96 U/L (ref 39–117)
BUN: 14 mg/dL (ref 6–23)
CO2: 32 meq/L (ref 19–32)
CREATININE: 0.61 mg/dL (ref 0.40–1.20)
Calcium: 9.3 mg/dL (ref 8.4–10.5)
Chloride: 102 mEq/L (ref 96–112)
GFR: 102.6 mL/min (ref >60.00)
GLUCOSE: 149 mg/dL — AB (ref 70–99)
Potassium: 3.9 mEq/L (ref 3.5–5.1)
SODIUM: 139 meq/L (ref 135–145)
Total Bilirubin: 1.6 mg/dL — ABNORMAL HIGH (ref 0.2–1.2)
Total Protein: 5.9 g/dL — ABNORMAL LOW (ref 6.0–8.3)

## 2014-10-26 LAB — CBC WITH DIFFERENTIAL/PLATELET
BASOS ABS: 0 10*3/uL (ref 0.0–0.1)
Basophils Relative: 0.5 % (ref 0.0–3.0)
EOS ABS: 0.2 10*3/uL (ref 0.0–0.7)
Eosinophils Relative: 3.1 % (ref 0.0–5.0)
HEMATOCRIT: 36.6 % (ref 36.0–46.0)
Hemoglobin: 12.2 g/dL (ref 12.0–15.0)
Lymphocytes Relative: 24.6 % (ref 12.0–46.0)
Lymphs Abs: 1.2 10*3/uL (ref 0.7–4.0)
MCHC: 33.3 g/dL (ref 30.0–36.0)
MCV: 91.1 fl (ref 78.0–100.0)
Monocytes Absolute: 0.7 10*3/uL (ref 0.1–1.0)
Monocytes Relative: 13.2 % — ABNORMAL HIGH (ref 3.0–12.0)
NEUTROS PCT: 58.6 % (ref 43.0–77.0)
Neutro Abs: 2.9 10*3/uL (ref 1.4–7.7)
Platelets: 112 10*3/uL — ABNORMAL LOW (ref 150.0–400.0)
RBC: 4.02 Mil/uL (ref 3.87–5.11)
RDW: 15.8 % — AB (ref 11.5–15.5)
WBC: 5 10*3/uL (ref 4.0–10.5)

## 2014-10-26 LAB — URINALYSIS, ROUTINE W REFLEX MICROSCOPIC
Bilirubin Urine: NEGATIVE
Hgb urine dipstick: NEGATIVE
LEUKOCYTES UA: NEGATIVE
Nitrite: NEGATIVE
Total Protein, Urine: NEGATIVE
UROBILINOGEN UA: 1 (ref 0.0–1.0)
Urine Glucose: NEGATIVE
pH: 6 (ref 5.0–8.0)

## 2014-10-26 MED ORDER — LACTULOSE 10 GM/15ML PO SOLN: 20.0000 g | Freq: Three times a day (TID) | ORAL | Status: DC

## 2014-10-26 MED ORDER — DIAZEPAM 5 MG PO TABS: 2.5000 mg | ORAL_TABLET | Freq: Every day | ORAL | Status: DC | PRN

## 2014-10-26 NOTE — Patient Instructions (Addendum)
Please continue medications as directed. Stay well hydrated and eat a balance diet.  Call Dr. Marlou Starks to re-discuss need for surgical procedure.  If your ammonia level is still elevated, we will expedite your referral to the Liver Specialist.  If normal, that is wonderful but I still think we should let a specialist consult on this matter.  If you notice any new symptoms or recurrence of confusion, please call 911.  Follow-up with Dr. Charlett Blake or myself in 2 weeks.

## 2014-10-26 NOTE — Progress Notes (Signed)
Pre visit review using our clinic review tool, if applicable. No additional management support is needed unless otherwise documented below in the visit note. 

## 2014-10-28 LAB — LACTIC ACID, PLASMA: LACTIC ACID: 3.1 mmol/L — ABNORMAL HIGH (ref 0.5–2.2)

## 2014-10-29 ENCOUNTER — Telehealth: Payer: Self-pay | Admitting: Physician Assistant

## 2014-10-29 ENCOUNTER — Encounter: Payer: Self-pay | Admitting: Critical Care Medicine

## 2014-10-29 ENCOUNTER — Ambulatory Visit (INDEPENDENT_AMBULATORY_CARE_PROVIDER_SITE_OTHER): Payer: Medicare HMO | Admitting: Critical Care Medicine

## 2014-10-29 ENCOUNTER — Other Ambulatory Visit (INDEPENDENT_AMBULATORY_CARE_PROVIDER_SITE_OTHER): Payer: Medicare HMO

## 2014-10-29 VITALS — BP 116/64 | HR 67 | Temp 97.6°F | Ht 66.0 in | Wt 152.0 lb

## 2014-10-29 DIAGNOSIS — J449 Chronic obstructive pulmonary disease, unspecified: Secondary | ICD-10-CM | POA: Diagnosis not present

## 2014-10-29 DIAGNOSIS — R4182 Altered mental status, unspecified: Secondary | ICD-10-CM | POA: Diagnosis not present

## 2014-10-29 DIAGNOSIS — E872 Acidosis, unspecified: Secondary | ICD-10-CM

## 2014-10-29 DIAGNOSIS — Z720 Tobacco use: Secondary | ICD-10-CM

## 2014-10-29 DIAGNOSIS — K7682 Hepatic encephalopathy: Secondary | ICD-10-CM

## 2014-10-29 DIAGNOSIS — R7989 Other specified abnormal findings of blood chemistry: Secondary | ICD-10-CM

## 2014-10-29 DIAGNOSIS — K769 Liver disease, unspecified: Secondary | ICD-10-CM | POA: Diagnosis not present

## 2014-10-29 DIAGNOSIS — K7290 Hepatic failure, unspecified without coma: Secondary | ICD-10-CM

## 2014-10-29 LAB — PULMONARY FUNCTION TEST
DL/VA % PRED: 39 %
DL/VA: 1.98 ml/min/mmHg/L
DLCO UNC: 8.21 ml/min/mmHg
DLCO unc % pred: 30 %
FEF 25-75 PRE: 0.34 L/s
FEF 25-75 Post: 0.48 L/sec
FEF2575-%Change-Post: 41 %
FEF2575-%Pred-Post: 24 %
FEF2575-%Pred-Pre: 17 %
FEV1-%CHANGE-POST: 12 %
FEV1-%Pred-Post: 41 %
FEV1-%Pred-Pre: 36 %
FEV1-Post: 0.99 L
FEV1-Pre: 0.88 L
FEV1FVC-%Change-Post: 7 %
FEV1FVC-%Pred-Pre: 65 %
FEV6-%Change-Post: 5 %
FEV6-%Pred-Post: 58 %
FEV6-%Pred-Pre: 55 %
FEV6-Post: 1.78 L
FEV6-Pre: 1.69 L
FEV6FVC-%Change-Post: 2 %
FEV6FVC-%PRED-POST: 101 %
FEV6FVC-%PRED-PRE: 99 %
FVC-%Change-Post: 4 %
FVC-%PRED-PRE: 55 %
FVC-%Pred-Post: 57 %
FVC-Post: 1.85 L
FVC-Pre: 1.77 L
Post FEV1/FVC ratio: 54 %
Post FEV6/FVC ratio: 98 %
Pre FEV1/FVC ratio: 50 %
Pre FEV6/FVC Ratio: 96 %
RV % pred: 181 %
RV: 4.21 L
TLC % pred: 113 %
TLC: 6.09 L

## 2014-10-29 LAB — AMMONIA: Ammonia: 43 umol/L — ABNORMAL HIGH (ref 11–35)

## 2014-10-29 NOTE — Assessment & Plan Note (Signed)
Ongoing tobacco use  Tobacco cessation counseling

## 2014-10-29 NOTE — Assessment & Plan Note (Signed)
Primary emphysema with stable lung disease, pt needs to focus on smokig cessation  Plan Cont smoking cessation efforts Cont inhaled meds

## 2014-10-29 NOTE — Patient Instructions (Signed)
STOP metformin, atorvastin Try to reduce valium use  Ammonia level obtained Follow up with Primary care MD No change in inhalers Focus on smoking cessation Return 2 months

## 2014-10-29 NOTE — Telephone Encounter (Signed)
Called patient at [phone] and left message for patient to return call.  eal

## 2014-10-29 NOTE — Progress Notes (Signed)
HPI   Subjective:    [name], female    DOB: [birth year], 74 y.o.   MRN: [id number]  HPI 74 y.o.  WF with Copd-active smoker Moved from [facility] 2008, saw Lung MD there.  Dx Copd many years.(2004) Still smokes    10/29/2014 Chief Complaint  Patient presents with  . Follow-up    with PFTs.  DOE unchanged.  Cough in the morning with clear to yellow phelgm.  No chest tightness/CP.    Pt in hosp recently for AMS.  Elevate ammonia level.  Elevated lactic acid on metformin.  No infection identified. No exac of copd seen Pt now improve but still on statin and metformin. Not currently smoking  Pt denies any significant sore throat, nasal congestion or excess secretions, fever, chills, sweats, unintended weight loss, pleurtic or exertional chest pain, orthopnea PND, or leg swelling Pt denies any increase in rescue therapy over baseline, denies waking up needing it or having any early am or nocturnal exacerbations of coughing/wheezing/or dyspnea. Pt also denies any obvious fluctuation in symptoms with  weather or environmental change or other alleviating or aggravating factors     Review of Systems Constitutional:   No  weight loss, night sweats,  Fevers, chills,  +fatigue, or  lassitude.  HEENT:   No headaches,  Difficulty swallowing,  Tooth/dental problems, or  Sore throat,                No sneezing, itching, ear ache, + nasal congestion, post nasal drip,   CV:  No chest pain,  Orthopnea, PND, swelling in lower extremities, anasarca, dizziness, palpitations, syncope.   GI  No heartburn, indigestion, abdominal pain, nausea, vomiting, diarrhea, change in bowel habits, loss of appetite, bloody stools.   Resp:   No chest wall deformity  Skin: no rash or lesions.  GU: no dysuria, change in color of urine, no urgency or frequency.  No flank pain, no hematuria   MS:  No   joint pain ., no swelling.  No decreased range of motion.  No back pain.  Psych:  No change  in mood or affect. No depression or anxiety.  No memory loss.     Physical Exam     Review of Systems    Objective:   Physical Exam Filed Vitals:   10/29/14 1103  BP: 116/64  Pulse: 67  Temp: 97.6 F (36.4 C)  TempSrc: Oral  Height: 5\' 6"  (1.676 m)  Weight: 152 lb (68.947 kg)  SpO2: 98%    Gen: Pleasant, elderly , chronically ill appearing  in no distress,  normal affect  ENT: No lesions,  mouth clear,  oropharynx clear, no postnasal drip  Neck: No JVD, no TMG, no carotid bruits  Lungs: No use of accessory muscles, no dullness to percussion, decreased BS in bases ,   Cardiovascular: RRR, heart sounds normal, no murmur or gallops, no peripheral edema  Abdomen: soft and NT, no HSM,  BS normal  Musculoskeletal: No deformities, no cyanosis or clubbing  Neuro: alert, non focal  Skin: Warm, no lesions or rashes  No results found.        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 albuterol (PROVENTIL HFA;VENTOLIN HFA) 108 (90 BASE) MCG/ACT inhaler Inhale 2 puffs into the lungs every 6 (six) hours as needed for wheezing or shortness of breath. Only dispense Ventolin  . aspirin EC 81 MG tablet Take 1 tablet (81 mg total) by mouth daily.  .  Cholecalciferol (VITAMIN D3) 2000 UNITS TABS Take 1 capsule by mouth daily.   . diazepam (VALIUM) 5 MG tablet Take 0.5 tablets (2.5 mg total) by mouth daily as needed for anxiety or muscle spasms. Takes 1/2-1 tablets as needed for anxiety  . Fluticasone-Salmeterol (ADVAIR) 250-50 MCG/DOSE AEPB Inhale 1 puff into the lungs 2 (two) times daily.  . furosemide (LASIX) 20 MG tablet Take 1 tablet (20 mg total) by mouth every Monday, Wednesday, and Friday. (Patient taking differently: Take 20 mg by mouth 3 (three) times a week. )  . ipratropium-albuterol (DUONEB) 0.5-2.5 (3) MG/3ML SOLN Take 3 mLs by nebulization every 4 (four) hours as needed.  . lactulose (CHRONULAC) 10 GM/15ML solution Take 30 mLs (20 g total) by mouth 3 (three) times daily.  Marland Kitchen omeprazole (PRILOSEC) 20 MG capsule Take 1 capsule (20 mg total) by mouth daily.  . [DISCONTINUED] lovastatin (MEVACOR) 20 MG tablet TAKE 1 TABLET BY MOUTH EVERY NIGHT AT BEDTIME  . [DISCONTINUED] metFORMIN (GLUCOPHAGE) 500 MG tablet TAKE 2 TABLETS (1,000 MG TOTAL) BY MOUTH 2 (TWO) TIMES DAILY WITH A MEAL. (Patient taking differently: Take 1 tablet twice daily)

## 2014-10-29 NOTE — Assessment & Plan Note (Signed)
Hx of altered mental status with elevated ammonia levels now resolving and associated with elevated lactic acid levels and liver dysfunction Concern of metformin use and statins in this setting of liver dysfunction Plan STOP metformin, atorvastin Try to reduce valium use  Ammonia level obtained Follow up with Primary care MD

## 2014-10-29 NOTE — Progress Notes (Signed)
PFT done today. 

## 2014-10-29 NOTE — Telephone Encounter (Signed)
Ammonia level has rise.  Make sure she is taking Lactulose exactly as directed. I  Am setting her up urgently with GI for further assessment. Want to repeat Ammonia level this Thursday.  If any confusion were to recur, she is to call [phone].

## 2014-11-02 ENCOUNTER — Encounter: Payer: Self-pay | Admitting: Family Medicine

## 2014-11-02 ENCOUNTER — Ambulatory Visit (INDEPENDENT_AMBULATORY_CARE_PROVIDER_SITE_OTHER): Payer: Medicare HMO | Admitting: Family Medicine

## 2014-11-02 VITALS — BP 116/60 | HR 84 | Temp 97.6°F | Resp 20 | Ht 67.0 in | Wt 150.0 lb

## 2014-11-02 DIAGNOSIS — R109 Unspecified abdominal pain: Secondary | ICD-10-CM

## 2014-11-02 DIAGNOSIS — G9340 Encephalopathy, unspecified: Secondary | ICD-10-CM

## 2014-11-02 DIAGNOSIS — J449 Chronic obstructive pulmonary disease, unspecified: Secondary | ICD-10-CM

## 2014-11-02 DIAGNOSIS — E785 Hyperlipidemia, unspecified: Secondary | ICD-10-CM

## 2014-11-02 DIAGNOSIS — E1165 Type 2 diabetes mellitus with hyperglycemia: Secondary | ICD-10-CM

## 2014-11-02 DIAGNOSIS — E7220 Disorder of urea cycle metabolism, unspecified: Secondary | ICD-10-CM

## 2014-11-02 DIAGNOSIS — IMO0002 Reserved for concepts with insufficient information to code with codable children: Secondary | ICD-10-CM

## 2014-11-02 DIAGNOSIS — E119 Type 2 diabetes mellitus without complications: Secondary | ICD-10-CM

## 2014-11-02 LAB — URINALYSIS
BILIRUBIN URINE: NEGATIVE
HGB URINE DIPSTICK: NEGATIVE
Ketones, ur: NEGATIVE
Leukocytes, UA: NEGATIVE
NITRITE: NEGATIVE
Specific Gravity, Urine: 1.02 (ref 1.000–1.030)
TOTAL PROTEIN, URINE-UPE24: NEGATIVE
UROBILINOGEN UA: 0.2 (ref 0.0–1.0)
Urine Glucose: NEGATIVE
pH: 6.5 (ref 5.0–8.0)

## 2014-11-02 LAB — COMPREHENSIVE METABOLIC PANEL
ALT: 27 U/L (ref 0–35)
AST: 36 U/L (ref 0–37)
Albumin: 3.5 g/dL (ref 3.5–5.2)
Alkaline Phosphatase: 104 U/L (ref 39–117)
BUN: 13 mg/dL (ref 6–23)
CALCIUM: 9.7 mg/dL (ref 8.4–10.5)
CHLORIDE: 102 meq/L (ref 96–112)
CO2: 32 mEq/L (ref 19–32)
CREATININE: 0.64 mg/dL (ref 0.40–1.20)
GFR: 97.07 mL/min (ref >60.00)
Glucose, Bld: 128 mg/dL — ABNORMAL HIGH (ref 70–99)
Potassium: 4.2 mEq/L (ref 3.5–5.1)
Sodium: 140 mEq/L (ref 135–145)
Total Bilirubin: 1.3 mg/dL — ABNORMAL HIGH (ref 0.2–1.2)
Total Protein: 6.3 g/dL (ref 6.0–8.3)

## 2014-11-02 MED ORDER — LACTULOSE 10 GM/15ML PO SOLN: 30.0000 g | Freq: Two times a day (BID) | ORAL | Status: DC | PRN

## 2014-11-02 NOTE — Patient Instructions (Signed)
Ammonia, Plasma Ammonia This is a test to detect elevated levels of ammonia in the blood, to evaluate changes in consciousness, or to help diagnose hepatic encephalopathy and Reye syndrome. It may be ordered when a patient experiences mental changes or lapses into a coma of unknown origin, if an infant or child experiences frequent vomiting and increased lethargy as a newborn, or about a week after a viral illness. Ammonia is a compound produced by intestinal bacteria and by cells in the body during the digestion of protein. Ammonia is a waste product that the liver changes into urea and glutamine. The urea is then carried by the blood to the kidneys, where it is put out in the urine. If this "urea cycle" does not complete, ammonia builds up in the blood. This also happens when you cannot put out urine (kidney failure) or when your liver does not work (hepatic failure). A buildup of ammonia in the body can cause mental and neurological changes that can lead to confusion, disorientation, sleepiness, and eventually to coma and even death. Infants and children with increased ammonia levels may vomit frequently, be irritable, and be increasingly lethargic. Left untreated, they may experience seizures, respiratory difficulty, and may lapse into a coma and die. PREPARATION FOR TEST No preparation or fasting is necessary. A blood sample is taken by a needle from a vein.  Avoid exercising before this test. NORMAL FINDINGS  Normal values depend on the method used for testing. Test results depend on many factors including age, sex, etc. Your lab report should include the specific reference range for your test. Your caregiver will go over you test results with you.  Adults: 10-80 mcg/dL (6-47 micromole/L)  Neonates, 0 to 10 days (enzymatic): 170-341 mcg/dL (100-200 micromole/L)  Infants and toddlers, 10 days to 2 years (enzymatic): 68-136 mcg/dL (40-80 micromole/L)  Children, older than 2 years (enzymatic):  19-60 mcg/dL (11-35 micromole/L) Ranges for normal findings may vary among different laboratories and hospitals. You should always check with your doctor after having lab work or other tests done to discuss the meaning of your test results and whether your values are considered within normal limits. MEANING OF TEST  Your caregiver will go over the test results with you and discuss the importance and meaning of your results, as well as treatment options and the need for additional tests if necessary. OBTAINING THE TEST RESULTS  It is your responsibility to obtain your test results. Ask the lab or department performing the test when and how you will get your results. Document Released: 08/04/2004 Document Revised: 11/27/2013 Document Reviewed: 06/18/2008 [facility] Patient Information 2015 [place], [place]. This information is not intended to replace advice given to you by your health care provider. Make sure you discuss any questions you have with your health care provider.

## 2014-11-03 LAB — LACTIC ACID, PLASMA: LACTIC ACID: 2 mmol/L (ref 0.5–2.2)

## 2014-11-04 LAB — URINE CULTURE
Colony Count: NO GROWTH
ORGANISM ID, BACTERIA: NO GROWTH

## 2014-11-05 ENCOUNTER — Other Ambulatory Visit: Payer: Self-pay | Admitting: Family Medicine

## 2014-11-05 MED ORDER — GLUCOSE BLOOD VI STRP: ORAL_STRIP | Status: DC

## 2014-11-05 MED ORDER — GLIMEPIRIDE 1 MG PO TABS: 1.0000 mg | ORAL_TABLET | Freq: Every day | ORAL | Status: DC

## 2014-11-08 ENCOUNTER — Encounter (HOSPITAL_BASED_OUTPATIENT_CLINIC_OR_DEPARTMENT_OTHER): Admission: RE | Payer: Self-pay | Source: Ambulatory Visit

## 2014-11-08 ENCOUNTER — Ambulatory Visit (HOSPITAL_BASED_OUTPATIENT_CLINIC_OR_DEPARTMENT_OTHER): Admission: RE | Admit: 2014-11-08 | Payer: Medicare HMO | Source: Ambulatory Visit | Admitting: General Surgery

## 2014-11-08 SURGERY — EXCISION MASS
Anesthesia: General | Laterality: Right

## 2014-11-10 ENCOUNTER — Encounter: Payer: Self-pay | Admitting: Family Medicine

## 2014-11-10 DIAGNOSIS — E7220 Disorder of urea cycle metabolism, unspecified: Secondary | ICD-10-CM | POA: Insufficient documentation

## 2014-11-10 NOTE — Assessment & Plan Note (Signed)
Improving will continue to hold Metformin and Atorvastatin for now. She is encouraged to accept referral to gastroenteroelogy

## 2014-11-10 NOTE — Assessment & Plan Note (Signed)
Encouraged complete cessation. Discussed need to quit as relates to risk of numerous cancers, cardiac and pulmonary disease as well as neurologic complications. Counseled for greater than 3 minutes 

## 2014-11-10 NOTE — Assessment & Plan Note (Signed)
Hold Atorvastatin for now.

## 2014-11-10 NOTE — Assessment & Plan Note (Signed)
hgba1c acceptable, minimize simple carbs. Increase exercise as tolerated. Continue current meds 

## 2014-11-10 NOTE — Progress Notes (Signed)
Ashley Savage  [id number] 1940/11/01 11/10/2014      Progress Note-Follow Up  Subjective  Chief Complaint  No chief complaint on file.   HPI  Patient is a 74 y.o. female in today for routine medical care. Patient is in today for follow-up on abnormal ammonia. She reports she feels well. She denies having any confusion with any of episodes where her ammonia was found to be high. She denies any fevers or chills. Continues to struggle with some shortness of breath and cough but that is her baseline state. Denies CP/palp/SOB/HA/congestion/fevers/GI or GU c/o. Taking meds as prescribed  Past Medical History  Diagnosis Date  . Emphysema   . Diabetes mellitus type 2  . Hyperlipidemia   . Cancer breast ca  right  . Anxiety   . Panic attacks   . Depression   . Arthritis of both knees 10/01/2013  . Benign paroxysmal positional vertigo 10/01/2013  . Neck pain 10/01/2013  . Esophageal reflux 10/01/2013  . Pedal edema 12/10/2013  . Overactive bladder 12/10/2013  . Tobacco abuse disorder 02/01/2014  . COPD (chronic obstructive pulmonary disease) 10/01/2013  . Serum ammonia increased 11/10/2014    Past Surgical History  Procedure Laterality Date  . Gallbladder surgery  1992  . Breast surgery  2009 right  . Appendectomy  2007  . Knee surgery    . Mandible fracture surgery    . Pilonidal cyst excision    . Tonsillectomy    . Cataract extraction      Family History  Problem Relation Age of Onset  . Heart failure Father   . COPD Father   . Arthritis Father 21  . Pneumonia Sister   . Breast cancer    . Stroke Mother   . Arthritis Mother 39  . Hyperlipidemia Mother   . Hypertension Mother   . Diabetes Mother   . Breast cancer Maternal Aunt   . Alcohol abuse Maternal Uncle     History   Social History  . Marital Status: Single    Spouse Name: N/A  . Number of Children: 0  . Years of Education: N/A   Occupational History  . retire    Social History Main Topics  . Smoking  status: Current Some Day Smoker -- 0.05 packs/day    Types: Cigarettes    Start date: 07/27/1964  . Smokeless tobacco: Never Used     Comment: No smoking x 2 wks on 10/29/14  . Alcohol Use: No  . Drug Use: No  . Sexual Activity: Yes    Birth Control/ Protection: Post-menopausal   Other Topics Concern  . Not on file   Social History Narrative    Current Outpatient Prescriptions on File Prior to Visit  Medication Sig Dispense Refill  . albuterol (PROVENTIL HFA;VENTOLIN HFA) 108 (90 BASE) MCG/ACT inhaler Inhale 2 puffs into the lungs every 6 (six) hours as needed for wheezing or shortness of breath. Only dispense Ventolin 1 Inhaler 3  . aspirin EC 81 MG tablet Take 1 tablet (81 mg total) by mouth daily. 90 tablet 3  . Cholecalciferol (VITAMIN D3) 2000 UNITS TABS Take 1 capsule by mouth daily.     . diazepam (VALIUM) 5 MG tablet Take 0.5 tablets (2.5 mg total) by mouth daily as needed for anxiety or muscle spasms. Takes 1/2-1 tablets as needed for anxiety 30 tablet 3  . Fluticasone-Salmeterol (ADVAIR) 250-50 MCG/DOSE AEPB Inhale 1 puff into the lungs 2 (two) times daily.    Marland Kitchen  furosemide (LASIX) 20 MG tablet Take 1 tablet (20 mg total) by mouth every Monday, Wednesday, and Friday. (Patient taking differently: Take 20 mg by mouth 3 (three) times a week. ) 30 tablet 3  . ipratropium-albuterol (DUONEB) 0.5-2.5 (3) MG/3ML SOLN Take 3 mLs by nebulization every 4 (four) hours as needed. 360 mL 1  . omeprazole (PRILOSEC) 20 MG capsule Take 1 capsule (20 mg total) by mouth daily. 30 capsule 3   No current facility-administered medications on file prior to visit.    Allergies  Allergen Reactions  . Citalopram     Confusion, irregular heart beat.  . Erythromycin     Stomach cramps  . Versed [Midazolam] Other (See Comments)    Patient stayed confusion stayed 4+days     Review of Systems  Review of Systems  Constitutional: Negative for fever and malaise/fatigue.  HENT: Negative for  congestion.   Eyes: Negative for discharge.  Respiratory: Negative for shortness of breath.   Cardiovascular: Negative for chest pain, palpitations and leg swelling.  Gastrointestinal: Negative for nausea, abdominal pain and diarrhea.  Genitourinary: Negative for dysuria.  Musculoskeletal: Negative for falls.  Skin: Negative for rash.  Neurological: Negative for loss of consciousness and headaches.  Endo/Heme/Allergies: Negative for polydipsia.  Psychiatric/Behavioral: Negative for depression and suicidal ideas. The patient is not nervous/anxious and does not have insomnia.     Objective  BP 116/60 mmHg  Pulse 84  Temp(Src) 97.6 F (36.4 C) (Oral)  Resp 20  Ht 5\' 7"  (1.702 m)  Wt 150 lb (68.04 kg)  BMI 23.49 kg/m2  SpO2 96%  Physical Exam  Physical Exam  Constitutional: She is oriented to person, place, and time and well-developed, well-nourished, and in no distress. No distress.  HENT:  Head: Normocephalic and atraumatic.  Eyes: Conjunctivae are normal.  Neck: Neck supple. No thyromegaly present.  Cardiovascular: Normal rate, regular rhythm and normal heart sounds.   No murmur heard. Pulmonary/Chest: Effort normal and breath sounds normal. She has no wheezes.  Abdominal: She exhibits no distension and no mass.  Musculoskeletal: She exhibits no edema.  Lymphadenopathy:    She has no cervical adenopathy.  Neurological: She is alert and oriented to person, place, and time.  Skin: Skin is warm and dry. No rash noted. She is not diaphoretic.  Psychiatric: Memory, affect and judgment normal.    Lab Results  Component Value Date   TSH 0.864 10/16/2014   Lab Results  Component Value Date   WBC 5.0 10/26/2014   HGB 12.2 10/26/2014   HCT 36.6 10/26/2014   MCV 91.1 10/26/2014   PLT 112.0* 10/26/2014   Lab Results  Component Value Date   CREATININE 0.64 11/02/2014   BUN 13 11/02/2014   NA 140 11/02/2014   K 4.2 11/02/2014   CL 102 11/02/2014   CO2 32 11/02/2014    Lab Results  Component Value Date   ALT 27 11/02/2014   AST 36 11/02/2014   ALKPHOS 104 11/02/2014   BILITOT 1.3* 11/02/2014   Lab Results  Component Value Date   CHOL 167 04/12/2014   Lab Results  Component Value Date   HDL 52.30 04/12/2014   Lab Results  Component Value Date   LDLCALC 98 04/12/2014   Lab Results  Component Value Date   TRIG 83.0 04/12/2014   Lab Results  Component Value Date   CHOLHDL 3 04/12/2014     Assessment & Plan  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 Encouraged complete  cessation. Discussed need to quit as relates to risk of numerous cancers, cardiac and pulmonary disease as well as neurologic complications. Counseled for greater than 3 minutes   Diabetes mellitus type 2, controlled [id number] acceptable, minimize simple carbs. Increase exercise as tolerated. Continue current meds   Hyperlipidemia Hold Atorvastatin for now.   Serum ammonia increased Improving will continue to hold Metformin and Atorvastatin for now. She is encouraged to accept referral to gastroenteroelogy

## 2014-11-11 DIAGNOSIS — G9340 Encephalopathy, unspecified: Secondary | ICD-10-CM | POA: Insufficient documentation

## 2014-11-11 NOTE — Progress Notes (Signed)
Patient presents to clinic today For hospital follow-up of encephalopathy.  Patients ammonia level noted to be high in Hospital as potential cause of symptoms.  Other workup negative for cause. Was given lactulose and ammonia levels trended.  Started to improve.  MD recommended further workup but patient declined.  Patient stabilized and encouraged to follow-up with PCP for further workup. Patient denies recurrence of symptoms.  Denies fever, chills, AMS, urinary urgency, frequency.  Denies dark urine.  Endorses good urinary and bowel output.  Past Medical History  Diagnosis Date  . Emphysema   . Diabetes mellitus type 2  . Hyperlipidemia   . Cancer breast ca  right  . Anxiety   . Panic attacks   . Depression   . Arthritis of both knees 10/01/2013  . Benign paroxysmal positional vertigo 10/01/2013  . Neck pain 10/01/2013  . Esophageal reflux 10/01/2013  . Pedal edema 12/10/2013  . Overactive bladder 12/10/2013  . Tobacco abuse disorder 02/01/2014  . COPD (chronic obstructive pulmonary disease) 10/01/2013  . Serum ammonia increased 11/10/2014    Current Outpatient Prescriptions on File Prior to Visit  Medication Sig Dispense Refill  . albuterol (PROVENTIL HFA;VENTOLIN HFA) 108 (90 BASE) MCG/ACT inhaler Inhale 2 puffs into the lungs every 6 (six) hours as needed for wheezing or shortness of breath. Only dispense Ventolin 1 Inhaler 3  . aspirin EC 81 MG tablet Take 1 tablet (81 mg total) by mouth daily. 90 tablet 3  . Cholecalciferol (VITAMIN D3) 2000 UNITS TABS Take 1 capsule by mouth daily.     . Fluticasone-Salmeterol (ADVAIR) 250-50 MCG/DOSE AEPB Inhale 1 puff into the lungs 2 (two) times daily.    . furosemide (LASIX) 20 MG tablet Take 1 tablet (20 mg total) by mouth every Monday, Wednesday, and Friday. (Patient taking differently: Take 20 mg by mouth 3 (three) times a week. ) 30 tablet 3  . ipratropium-albuterol (DUONEB) 0.5-2.5 (3) MG/3ML SOLN Take 3 mLs by nebulization every 4 (four) hours  as needed. 360 mL 1  . omeprazole (PRILOSEC) 20 MG capsule Take 1 capsule (20 mg total) by mouth daily. 30 capsule 3   No current facility-administered medications on file prior to visit.    Allergies  Allergen Reactions  . Citalopram     Confusion, irregular heart beat.  . Erythromycin     Stomach cramps  . Versed [Midazolam] Other (See Comments)    Patient stayed confusion stayed 4+days     Family History  Problem Relation Age of Onset  . Heart failure Father   . COPD Father   . Arthritis Father 9  . Pneumonia Sister   . Breast cancer    . Stroke Mother   . Arthritis Mother 48  . Hyperlipidemia Mother   . Hypertension Mother   . Diabetes Mother   . Breast cancer Maternal Aunt   . Alcohol abuse Maternal Uncle     History   Social History  . Marital Status: Single    Spouse Name: N/A  . Number of Children: 0  . Years of Education: N/A   Occupational History  . retire    Social History Main Topics  . Smoking status: Current Some Day Smoker -- 0.05 packs/day    Types: Cigarettes    Start date: 07/27/1964  . Smokeless tobacco: Never Used     Comment: No smoking x 2 wks on 10/29/14  . Alcohol Use: No  . Drug Use: No  . Sexual Activity: Yes  Birth Control/ Protection: Post-menopausal   Other Topics Concern  . None   Social History Narrative    Review of Systems - See HPI.  All other ROS are negative.  BP 124/72 mmHg  Pulse 85  Temp(Src) 98.2 F (36.8 C) (Oral)  Ht '5\' 5"'  (1.651 m)  Wt 149 lb 2 oz (67.643 kg)  BMI 24.82 kg/m2  SpO2 92%  Physical Exam  Constitutional: She is oriented to person, place, and time and well-developed, well-nourished, and in no distress.  HENT:  Head: Normocephalic and atraumatic.  Eyes: Conjunctivae are normal.  Neck: Neck supple.  Cardiovascular: Normal rate, regular rhythm, normal heart sounds and intact distal pulses.   Pulmonary/Chest: Effort normal and breath sounds normal. No respiratory distress. She has no  wheezes. She has no rales. She exhibits no tenderness.  Lymphadenopathy:    She has no cervical adenopathy.  Neurological: She is alert and oriented to person, place, and time.  Skin: Skin is warm and dry. No rash noted.  Psychiatric: Affect normal.  Vitals reviewed.   Recent Results (from the past 2160 hour(s))  CBC with Differential [facility])     Status: Abnormal   Collection Time: 09/26/14 10:38 AM  Result Value Ref Range   WBC 4.5 3.9 - 10.0 10e3/uL   RBC 4.27 3.70 - 5.32 10e6/uL   HGB 13.0 11.6 - 15.9 g/dL   HCT 40.1 34.8 - 46.6 %   MCV 94 81 - 101 fL   MCH 30.4 26.0 - 34.0 pg   MCHC 32.4 32.0 - 36.0 g/dL   RDW 14.4 11.1 - 15.7 %   Platelets 128 (L) 145 - 400 10e3/uL   NEUT# 2.2 1.5 - 6.5 10e3/uL   LYMPH# 1.5 0.9 - 3.3 10e3/uL   MONO# 0.7 0.1 - 0.9 10e3/uL   Eosinophils Absolute 0.2 0.0 - 0.5 10e3/uL   BASO# 0.1 0.0 - 0.2 10e3/uL   NEUT% 47.5 39.6 - 80.0 %   LYMPH% 33.6 14.0 - 48.0 %   MONO% 14.3 (H) 0.0 - 13.0 %   EOS% 3.5 0.0 - 7.0 %   BASO% 1.1 0.0 - 2.0 %  Comprehensive metabolic panel     Status: Abnormal   Collection Time: 09/26/14 10:39 AM  Result Value Ref Range   Sodium 139 136 - 145 mEq/L   Potassium 4.3 3.5 - 5.1 mEq/L   Chloride 98 98 - 109 mEq/L   CO2 29 22 - 29 mEq/L   Glucose 186 (H) 70 - 140 mg/dl   BUN 19.2 7.0 - 26.0 mg/dL   Creatinine 0.8 0.6 - 1.1 mg/dL   Total Bilirubin 1.56 (H) 0.20 - 1.20 mg/dL   Alkaline Phosphatase 117 40 - 150 U/L   AST 33 5 - 34 U/L   ALT 23 0 - 55 U/L   Total Protein 6.1 (L) 6.4 - 8.3 g/dL   Albumin 3.3 (L) 3.5 - 5.0 g/dL   Calcium 9.2 8.4 - 10.4 mg/dL   Anion Gap 13 (H) 3 - 11 mEq/L   EGFR 77 (L) >90 ml/min/1.73 m2    Comment: eGFR is calculated using the CKD-EPI Creatinine Equation (2009)  Vit D  25 hydroxy (rtn osteoporosis monitoring)     Status: None   Collection Time: 09/26/14 10:40 AM  Result Value Ref Range   Vit D, 25-Hydroxy 50 30 - 100 ng/mL    Comment: Vitamin D Status           25-OH Vitamin  D  Deficiency                <20 ng/mL       Insufficiency         20 - 29 ng/mL       Optimal             > or = 30 ng/mL For 25-OH Vitamin D testing on patients on D2-supplementation andpatients for whom  quantitation of D2 and D3 fractions is required, theQuestAssureD 25-OH VIT D, (D2,D3), LC/MS/MS is recommended: order [id number] (patients > 2 yrs).   Urinalysis, Routine w reflex microscopic     Status: Abnormal   Collection Time: 10/15/14  4:50 PM  Result Value Ref Range   Color, Urine YELLOW YELLOW   APPearance CLEAR CLEAR   Specific Gravity, Urine 1.018 1.005 - 1.030   pH 6.0 5.0 - 8.0   Glucose, UA 100 (A) NEGATIVE mg/dL   Hgb urine dipstick NEGATIVE NEGATIVE   Bilirubin Urine NEGATIVE NEGATIVE   Ketones, ur NEGATIVE NEGATIVE mg/dL   Protein, ur NEGATIVE NEGATIVE mg/dL   Urobilinogen, UA 2.0 (H) 0.0 - 1.0 mg/dL   Nitrite NEGATIVE NEGATIVE   Leukocytes, UA NEGATIVE NEGATIVE    Comment: MICROSCOPIC NOT DONE ON URINES WITH NEGATIVE PROTEIN, BLOOD, LEUKOCYTES, NITRITE, OR GLUCOSE <1000 mg/dL.  Drug screen panel, emergency     Status: Abnormal   Collection Time: 10/15/14  4:50 PM  Result Value Ref Range   Opiates NONE DETECTED NONE DETECTED   Cocaine NONE DETECTED NONE DETECTED   Benzodiazepines POSITIVE (A) NONE DETECTED   Amphetamines NONE DETECTED NONE DETECTED   Tetrahydrocannabinol NONE DETECTED NONE DETECTED   Barbiturates NONE DETECTED NONE DETECTED    Comment:        DRUG SCREEN FOR MEDICAL PURPOSES ONLY.  IF CONFIRMATION IS NEEDED FOR ANY PURPOSE, NOTIFY LAB WITHIN 5 DAYS.        LOWEST DETECTABLE LIMITS FOR URINE DRUG SCREEN Drug Class       Cutoff (ng/mL) Amphetamine      1000 Barbiturate      200 Benzodiazepine   417 Tricyclics       408 Opiates          300 Cocaine          300 THC              50   CBC with Differential     Status: Abnormal   Collection Time: 10/15/14  6:00 PM  Result Value Ref Range   WBC 5.0 4.0 - 10.5 K/uL   RBC 4.40 3.87 -  5.11 MIL/uL   Hemoglobin 13.5 12.0 - 15.0 g/dL   HCT 40.5 36.0 - 46.0 %   MCV 92.0 78.0 - 100.0 fL   MCH 30.7 26.0 - 34.0 pg   MCHC 33.3 30.0 - 36.0 g/dL   RDW 14.5 11.5 - 15.5 %   Platelets 127 (L) 150 - 400 K/uL   Neutrophils Relative % 54 43 - 77 %   Neutro Abs 2.7 1.7 - 7.7 K/uL   Lymphocytes Relative 29 12 - 46 %   Lymphs Abs 1.4 0.7 - 4.0 K/uL   Monocytes Relative 14 (H) 3 - 12 %   Monocytes Absolute 0.7 0.1 - 1.0 K/uL   Eosinophils Relative 2 0 - 5 %   Eosinophils Absolute 0.1 0.0 - 0.7 K/uL   Basophils Relative 1 0 - 1 %   Basophils Absolute 0.0 0.0 - 0.1 K/uL  Comprehensive metabolic  panel     Status: Abnormal   Collection Time: 10/15/14  6:00 PM  Result Value Ref Range   Sodium 142 135 - 145 mmol/L   Potassium 4.2 3.5 - 5.1 mmol/L   Chloride 105 96 - 112 mmol/L   CO2 28 19 - 32 mmol/L   Glucose, Bld 109 (H) 70 - 99 mg/dL   BUN 15 6 - 23 mg/dL   Creatinine, Ser 0.64 0.50 - 1.10 mg/dL   Calcium 9.5 8.4 - 10.5 mg/dL   Total Protein 6.6 6.0 - 8.3 g/dL   Albumin 3.7 3.5 - 5.2 g/dL   AST 41 (H) 0 - 37 U/L   ALT 28 0 - 35 U/L   Alkaline Phosphatase 98 39 - 117 U/L   Total Bilirubin 1.3 (H) 0.3 - 1.2 mg/dL   GFR calc non Af Amer 88 (L) >90 mL/min   GFR calc Af Amer >90 >90 mL/min    Comment: (NOTE) The eGFR has been calculated using the CKD EPI equation. This calculation has not been validated in all clinical situations. eGFR's persistently <90 mL/min signify possible Chronic Kidney Disease.    Anion gap 9 5 - 15  Ammonia     Status: Abnormal   Collection Time: 10/15/14  6:00 PM  Result Value Ref Range   Ammonia 64 (H) 11 - 32 umol/L  Ethanol     Status: None   Collection Time: 10/15/14  6:00 PM  Result Value Ref Range   Alcohol, Ethyl (B) <5 0 - 9 mg/dL    Comment:        LOWEST DETECTABLE LIMIT FOR SERUM ALCOHOL IS 11 mg/dL FOR MEDICAL PURPOSES ONLY   I-Stat arterial blood gas, ED     Status: Abnormal   Collection Time: 10/15/14  6:04 PM  Result Value  Ref Range   pH, Arterial 7.426 7.350 - 7.450   pCO2 arterial 37.2 35.0 - 45.0 mmHg   pO2, Arterial 70.0 (L) 80.0 - 100.0 mmHg   Bicarbonate 24.5 (H) 20.0 - 24.0 mEq/L   TCO2 26 0 - 100 mmol/L   O2 Saturation 95.0 %   Patient temperature 98.0 F    Collection site BRACHIAL ARTERY    Drawn by Operator    Sample type ARTERIAL   CBC     Status: Abnormal   Collection Time: 10/16/14  6:20 AM  Result Value Ref Range   WBC 4.1 4.0 - 10.5 K/uL   RBC 3.82 (L) 3.87 - 5.11 MIL/uL   Hemoglobin 11.7 (L) 12.0 - 15.0 g/dL   HCT 34.9 (L) 36.0 - 46.0 %   MCV 91.4 78.0 - 100.0 fL   MCH 30.6 26.0 - 34.0 pg   MCHC 33.5 30.0 - 36.0 g/dL   RDW 14.8 11.5 - 15.5 %   Platelets 102 (L) 150 - 400 K/uL    Comment: PLATELET COUNT CONFIRMED BY SMEAR  Creatinine, serum     Status: Abnormal   Collection Time: 10/16/14  6:20 AM  Result Value Ref Range   Creatinine, Ser 0.66 0.50 - 1.10 mg/dL   GFR calc non Af Amer 87 (L) >90 mL/min   GFR calc Af Amer >90 >90 mL/min    Comment: (NOTE) The eGFR has been calculated using the CKD EPI equation. This calculation has not been validated in all clinical situations. eGFR's persistently <90 mL/min signify possible Chronic Kidney Disease.   HIV antibody     Status: None   Collection Time: 10/16/14  6:20 AM  Result Value Ref Range   HIV Screen 4th Generation wRfx Non Reactive Non Reactive    Comment: (NOTE) Performed At: [facility] [address] [place], [place] [id number] Ashley Romp MD [id number]   Ammonia     Status: Abnormal   Collection Time: 10/16/14  6:50 AM  Result Value Ref Range   Ammonia 113 (H) 11 - 32 umol/L  RPR     Status: None   Collection Time: 10/16/14  6:50 AM  Result Value Ref Range   RPR Ser Ql Non Reactive Non Reactive    Comment: (NOTE) Performed At: [facility] [place], [place] [id number] Ashley Romp MD PY:[id number]   TSH     Status: None   Collection Time: 10/16/14  6:50 AM  Result  Value Ref Range   TSH 0.864 0.350 - 4.500 uIU/mL  Vitamin B12     Status: None   Collection Time: 10/16/14  6:50 AM  Result Value Ref Range   Vitamin B-12 644 211 - 911 pg/mL    Comment: Performed at [facility]  Folate     Status: None   Collection Time: 10/16/14  6:50 AM  Result Value Ref Range   Folate >20.0 ng/mL    Comment: (NOTE) Reference Ranges        Deficient:       0.4 - 3.3 ng/mL        Indeterminate:   3.4 - 5.4 ng/mL        Normal:              > 5.4 ng/mL Performed at [facility]   Glucose, capillary     Status: Abnormal   Collection Time: 10/16/14  8:40 AM  Result Value Ref Range   Glucose-Capillary 135 (H) 70 - 99 mg/dL  Glucose, capillary     Status: Abnormal   Collection Time: 10/16/14 11:57 AM  Result Value Ref Range   Glucose-Capillary 153 (H) 70 - 99 mg/dL  Lactic acid, plasma     Status: Abnormal   Collection Time: 10/16/14  4:40 PM  Result Value Ref Range   Lactic Acid, Venous 2.7 (HH) 0.5 - 2.0 mmol/L    Comment: REPEATED TO VERIFY CRITICAL RESULT CALLED TO, READ BACK BY AND VERIFIED WITH: T JETER,RN [id number] 10/16/14 D BRADLEY   Procalcitonin - Baseline     Status: None   Collection Time: 10/16/14  4:40 PM  Result Value Ref Range   Procalcitonin <0.10 ng/mL    Comment:        Interpretation: PCT (Procalcitonin) <= 0.5 ng/mL: Systemic infection (sepsis) is not likely. Local bacterial infection is possible. (NOTE)         ICU PCT Algorithm               Non ICU PCT Algorithm    ----------------------------     ------------------------------         PCT < 0.25 ng/mL                 PCT < 0.1 ng/mL     Stopping of antibiotics            Stopping of antibiotics       strongly encouraged.               strongly encouraged.    ----------------------------     ------------------------------       PCT level decrease by  PCT < 0.25 ng/mL       >= 80% from peak PCT       OR PCT 0.25 - 0.5 ng/mL          Stopping of  antibiotics                                             encouraged.     Stopping of antibiotics           encouraged.    ----------------------------     ------------------------------       PCT level decrease by              PCT >= 0.25 ng/mL       < 80% from peak PCT        AND PCT >= 0.5 ng/mL            Continuin g antibiotics                                              encouraged.       Continuing antibiotics            encouraged.    ----------------------------     ------------------------------     PCT level increase compared          PCT > 0.5 ng/mL         with peak PCT AND          PCT >= 0.5 ng/mL             Escalation of antibiotics                                          strongly encouraged.      Escalation of antibiotics        strongly encouraged.   Glucose, capillary     Status: Abnormal   Collection Time: 10/16/14  5:14 PM  Result Value Ref Range   Glucose-Capillary 120 (H) 70 - 99 mg/dL  Glucose, capillary     Status: Abnormal   Collection Time: 10/16/14 10:02 PM  Result Value Ref Range   Glucose-Capillary 158 (H) 70 - 99 mg/dL  Comprehensive metabolic panel     Status: Abnormal   Collection Time: 10/17/14  6:05 AM  Result Value Ref Range   Sodium 138 135 - 145 mmol/L   Potassium 4.6 3.5 - 5.1 mmol/L   Chloride 106 96 - 112 mmol/L   CO2 26 19 - 32 mmol/L   Glucose, Bld 163 (H) 70 - 99 mg/dL   BUN 17 6 - 23 mg/dL   Creatinine, Ser 0.78 0.50 - 1.10 mg/dL   Calcium 9.1 8.4 - 10.5 mg/dL   Total Protein 5.7 (L) 6.0 - 8.3 g/dL   Albumin 3.0 (L) 3.5 - 5.2 g/dL   AST 37 0 - 37 U/L   ALT 25 0 - 35 U/L   Alkaline Phosphatase 85 39 - 117 U/L   Total Bilirubin 1.6 (H) 0.3 - 1.2 mg/dL   GFR calc non Af Amer 82 (L) >90 mL/min   GFR calc Af Amer >90 >90 mL/min  Comment: (NOTE) The eGFR has been calculated using the CKD EPI equation. This calculation has not been validated in all clinical situations. eGFR's persistently <90 mL/min signify possible Chronic  Kidney Disease.    Anion gap 6 5 - 15  Ammonia     Status: Abnormal   Collection Time: 10/17/14  6:05 AM  Result Value Ref Range   Ammonia 79 (H) 11 - 32 umol/L  CBC     Status: Abnormal   Collection Time: 10/17/14  6:05 AM  Result Value Ref Range   WBC 4.5 4.0 - 10.5 K/uL   RBC 4.22 3.87 - 5.11 MIL/uL   Hemoglobin 12.7 12.0 - 15.0 g/dL   HCT 38.1 36.0 - 46.0 %   MCV 90.3 78.0 - 100.0 fL   MCH 30.1 26.0 - 34.0 pg   MCHC 33.3 30.0 - 36.0 g/dL   RDW 14.5 11.5 - 15.5 %   Platelets 107 (L) 150 - 400 K/uL    Comment: CONSISTENT WITH PREVIOUS RESULT  Lactic acid, plasma     Status: Abnormal   Collection Time: 10/17/14  6:05 AM  Result Value Ref Range   Lactic Acid, Venous 2.2 (HH) 0.5 - 2.0 mmol/L    Comment: REPEATED TO VERIFY CRITICAL RESULT CALLED TO, READ BACK BY AND VERIFIED WITH: Ashley Savage,J RN 10/17/14 0745 TEETER,N   Glucose, capillary     Status: Abnormal   Collection Time: 10/17/14  8:08 AM  Result Value Ref Range   Glucose-Capillary 140 (H) 70 - 99 mg/dL  Glucose, capillary     Status: Abnormal   Collection Time: 10/17/14 12:10 PM  Result Value Ref Range   Glucose-Capillary 142 (H) 70 - 99 mg/dL  Glucose, capillary     Status: Abnormal   Collection Time: 10/17/14  5:32 PM  Result Value Ref Range   Glucose-Capillary 165 (H) 70 - 99 mg/dL  Glucose, capillary     Status: Abnormal   Collection Time: 10/17/14 11:49 PM  Result Value Ref Range   Glucose-Capillary 161 (H) 70 - 99 mg/dL  Procalcitonin     Status: None   Collection Time: 10/18/14  7:20 AM  Result Value Ref Range   Procalcitonin <0.10 ng/mL    Comment:        Interpretation: PCT (Procalcitonin) <= 0.5 ng/mL: Systemic infection (sepsis) is not likely. Local bacterial infection is possible. (NOTE)         ICU PCT Algorithm               Non ICU PCT Algorithm    ----------------------------     ------------------------------         PCT < 0.25 ng/mL                 PCT < 0.1 ng/mL     Stopping of  antibiotics            Stopping of antibiotics       strongly encouraged.               strongly encouraged.    ----------------------------     ------------------------------       PCT level decrease by               PCT < 0.25 ng/mL       >= 80% from peak PCT       OR PCT 0.25 - 0.5 ng/mL          Stopping of antibiotics  encouraged.     Stopping of antibiotics           encouraged.    ----------------------------     ------------------------------       PCT level decrease by              PCT >= 0.25 ng/mL       < 80% from peak PCT        AND PCT >= 0.5 ng/mL            Continuin g antibiotics                                              encouraged.       Continuing antibiotics            encouraged.    ----------------------------     ------------------------------     PCT level increase compared          PCT > 0.5 ng/mL         with peak PCT AND          PCT >= 0.5 ng/mL             Escalation of antibiotics                                          strongly encouraged.      Escalation of antibiotics        strongly encouraged.   Lactic acid, plasma     Status: Abnormal   Collection Time: 10/18/14  7:20 AM  Result Value Ref Range   Lactic Acid, Venous 2.4 (HH) 0.5 - 2.0 mmol/L    Comment: REPEATED TO VERIFY CRITICAL RESULT CALLED TO, READ BACK BY AND VERIFIED WITH: Ashley WESSELINK,RN AT 8309 10/18/14 BY ZBEECH.   Ammonia     Status: Abnormal   Collection Time: 10/18/14  7:20 AM  Result Value Ref Range   Ammonia 80 (H) 11 - 32 umol/L  Comprehensive metabolic panel     Status: Abnormal   Collection Time: 10/18/14  7:20 AM  Result Value Ref Range   Sodium 138 135 - 145 mmol/L   Potassium 3.7 3.5 - 5.1 mmol/L   Chloride 103 96 - 112 mmol/L   CO2 24 19 - 32 mmol/L   Glucose, Bld 161 (H) 70 - 99 mg/dL   BUN 19 6 - 23 mg/dL   Creatinine, Ser 0.67 0.50 - 1.10 mg/dL   Calcium 9.0 8.4 - 10.5 mg/dL   Total Protein 5.9 (L) 6.0 - 8.3  g/dL   Albumin 3.2 (L) 3.5 - 5.2 g/dL   AST 35 0 - 37 U/L   ALT 25 0 - 35 U/L   Alkaline Phosphatase 83 39 - 117 U/L   Total Bilirubin 1.6 (H) 0.3 - 1.2 mg/dL   GFR calc non Af Amer 86 (L) >90 mL/min   GFR calc Af Amer >90 >90 mL/min    Comment: (NOTE) The eGFR has been calculated using the CKD EPI equation. This calculation has not been validated in all clinical situations. eGFR's persistently <90 mL/min signify possible Chronic Kidney Disease.    Anion gap 11 5 - 15  CBC     Status: Abnormal   Collection Time: 10/18/14  7:20 AM  Result Value Ref Range   WBC 7.4 4.0 - 10.5 K/uL   RBC 4.21 3.87 - 5.11 MIL/uL   Hemoglobin 12.7 12.0 - 15.0 g/dL   HCT 38.6 36.0 - 46.0 %   MCV 91.7 78.0 - 100.0 fL   MCH 30.2 26.0 - 34.0 pg   MCHC 32.9 30.0 - 36.0 g/dL   RDW 14.7 11.5 - 15.5 %   Platelets 108 (L) 150 - 400 K/uL    Comment: CONSISTENT WITH PREVIOUS RESULT  Glucose, capillary     Status: Abnormal   Collection Time: 10/18/14  8:18 AM  Result Value Ref Range   Glucose-Capillary 135 (H) 70 - 99 mg/dL  Glucose, capillary     Status: Abnormal   Collection Time: 10/18/14 12:17 PM  Result Value Ref Range   Glucose-Capillary 197 (H) 70 - 99 mg/dL  Lactic acid, plasma     Status: Abnormal   Collection Time: 10/26/14  9:12 AM  Result Value Ref Range   LACTIC ACID 3.1 (H) 0.5 - 2.2 mmol/L  Urinalysis, Routine w reflex microscopic     Status: Abnormal   Collection Time: 10/26/14  9:12 AM  Result Value Ref Range   Color, Urine YELLOW Yellow;Lt. Yellow   APPearance CLEAR Clear   Specific Gravity, Urine >=1.030 (A) 1.000 - 1.030   pH 6.0 5.0 - 8.0   Total Protein, Urine NEGATIVE Negative   Urine Glucose NEGATIVE Negative   Ketones, ur TRACE (A) Negative   Bilirubin Urine NEGATIVE Negative   Hgb urine dipstick NEGATIVE Negative   Urobilinogen, UA 1.0 0.0 - 1.0   Leukocytes, UA NEGATIVE Negative   Nitrite NEGATIVE Negative   WBC, UA 0-2/hpf 0-2/hpf   RBC / HPF 0-2/hpf 0-2/hpf    Mucus, UA Presence of (A) None   Squamous Epithelial / LPF Many(>10/hpf) (A) Rare(0-4/hpf)   Bacteria, UA Few(10-50/hpf) (A) None  CBC with Differential/Platelet     Status: Abnormal   Collection Time: 10/26/14  9:12 AM  Result Value Ref Range   WBC 5.0 4.0 - 10.5 K/uL   RBC 4.02 3.87 - 5.11 Mil/uL   Hemoglobin 12.2 12.0 - 15.0 g/dL   HCT 36.6 36.0 - 46.0 %   MCV 91.1 78.0 - 100.0 fl   MCHC 33.3 30.0 - 36.0 g/dL   RDW 15.8 (H) 11.5 - 15.5 %   Platelets 112.0 (L) 150.0 - 400.0 K/uL   Neutrophils Relative % 58.6 43.0 - 77.0 %   Lymphocytes Relative 24.6 12.0 - 46.0 %   Monocytes Relative 13.2 (H) 3.0 - 12.0 %   Eosinophils Relative 3.1 0.0 - 5.0 %   Basophils Relative 0.5 0.0 - 3.0 %   Neutro Abs 2.9 1.4 - 7.7 K/uL   Lymphs Abs 1.2 0.7 - 4.0 K/uL   Monocytes Absolute 0.7 0.1 - 1.0 K/uL   Eosinophils Absolute 0.2 0.0 - 0.7 K/uL   Basophils Absolute 0.0 0.0 - 0.1 K/uL  Comprehensive metabolic panel     Status: Abnormal   Collection Time: 10/26/14  9:12 AM  Result Value Ref Range   Sodium 139 135 - 145 mEq/L   Potassium 3.9 3.5 - 5.1 mEq/L   Chloride 102 96 - 112 mEq/L   CO2 32 19 - 32 mEq/L   Glucose, Bld 149 (H) 70 - 99 mg/dL   BUN 14 6 - 23 mg/dL   Creatinine, Ser 0.61 0.40 - 1.20 mg/dL   Total Bilirubin 1.6 (H) 0.2 - 1.2 mg/dL  Alkaline Phosphatase 96 39 - 117 U/L   AST 27 0 - 37 U/L   ALT 27 0 - 35 U/L   Total Protein 5.9 (L) 6.0 - 8.3 g/dL   Albumin 3.3 (L) 3.5 - 5.2 g/dL   Calcium 9.3 8.4 - 10.5 mg/dL   GFR 102.60 >60.00 mL/min  Pulmonary Function Test     Status: None (Preliminary result)   Collection Time: 10/29/14  9:46 AM  Result Value Ref Range   FVC-Pre 1.77 L   FVC-%Pred-Pre 55 %   FVC-Post 1.85 L   FVC-%Pred-Post 57 %   FVC-%Change-Post 4 %   FEV1-Pre 0.88 L   FEV1-%Pred-Pre 36 %   FEV1-Post 0.99 L   FEV1-%Pred-Post 41 %   FEV1-%Change-Post 12 %   FEV6-Pre 1.69 L   FEV6-%Pred-Pre 55 %   FEV6-Post 1.78 L   FEV6-%Pred-Post 58 %   FEV6-%Change-Post 5  %   Pre FEV1/FVC ratio 50 %   FEV1FVC-%Pred-Pre 65 %   Post FEV1/FVC ratio 54 %   FEV1FVC-%Change-Post 7 %   Pre FEV6/FVC Ratio 96 %   FEV6FVC-%Pred-Pre 99 %   Post FEV6/FVC ratio 98 %   FEV6FVC-%Pred-Post 101 %   FEV6FVC-%Change-Post 2 %   FEF 25-75 Pre 0.34 L/sec   FEF2575-%Pred-Pre 17 %   FEF 25-75 Post 0.48 L/sec   FEF2575-%Pred-Post 24 %   FEF2575-%Change-Post 41 %   RV 4.21 L   RV % pred 181 %   TLC 6.09 L   TLC % pred 113 %   DLCO unc 8.21 ml/min/mmHg   DLCO unc % pred 30 %   DL/VA 1.98 ml/min/mmHg/L   DL/VA % pred 39 %  Ammonia     Status: Abnormal   Collection Time: 10/29/14 11:47 AM  Result Value Ref Range   Ammonia 43 (H) 11 - 35 umol/L  Lactic acid, plasma     Status: None   Collection Time: 11/02/14 10:52 AM  Result Value Ref Range   LACTIC ACID 2.0 0.5 - 2.2 mmol/L  Urinalysis     Status: None   Collection Time: 11/02/14 10:52 AM  Result Value Ref Range   Color, Urine YELLOW Yellow;Lt. Yellow   APPearance CLEAR Clear   Specific Gravity, Urine 1.020 1.000-1.030   pH 6.5 5.0 - 8.0   Total Protein, Urine NEGATIVE Negative   Urine Glucose NEGATIVE Negative   Ketones, ur NEGATIVE Negative   Bilirubin Urine NEGATIVE Negative   Hgb urine dipstick NEGATIVE Negative   Urobilinogen, UA 0.2 0.0 - 1.0   Leukocytes, UA NEGATIVE Negative   Nitrite NEGATIVE Negative  Urine culture     Status: None   Collection Time: 11/02/14 10:52 AM  Result Value Ref Range   Colony Count NO GROWTH    Organism ID, Bacteria NO GROWTH   Comp Met (CMET)     Status: Abnormal   Collection Time: 11/02/14 10:52 AM  Result Value Ref Range   Sodium 140 135 - 145 mEq/L   Potassium 4.2 3.5 - 5.1 mEq/L   Chloride 102 96 - 112 mEq/L   CO2 32 19 - 32 mEq/L   Glucose, Bld 128 (H) 70 - 99 mg/dL   BUN 13 6 - 23 mg/dL   Creatinine, Ser 0.64 0.40 - 1.20 mg/dL   Total Bilirubin 1.3 (H) 0.2 - 1.2 mg/dL   Alkaline Phosphatase 104 39 - 117 U/L   AST 36 0 - 37 U/L   ALT 27 0 - 35 U/L  Total  Protein 6.3 6.0 - 8.3 g/dL   Albumin 3.5 3.5 - 5.2 g/dL   Calcium 9.7 8.4 - 10.5 mg/dL   GFR 97.07 >60.00 mL/min    Assessment/Plan: Acute encephalopathy Clinically resolved.  Will repeat lab workup to include cbc, cmp, lactic acid, Ua and ammonia to further assess. Continue Lactulose. REferral to GI placed for further assessment as there are multiple causes to her elevated ammonia level.

## 2014-11-11 NOTE — Assessment & Plan Note (Signed)
Clinically resolved.  Will repeat lab workup to include cbc, cmp, lactic acid, Ua and ammonia to further assess. Continue Lactulose. REferral to GI placed for further assessment as there are multiple causes to her elevated ammonia level.

## 2014-11-12 ENCOUNTER — Encounter: Payer: Self-pay | Admitting: Physician Assistant

## 2014-11-19 ENCOUNTER — Ambulatory Visit (INDEPENDENT_AMBULATORY_CARE_PROVIDER_SITE_OTHER): Payer: Medicare HMO | Admitting: Family Medicine

## 2014-11-19 ENCOUNTER — Encounter: Payer: Self-pay | Admitting: Family Medicine

## 2014-11-19 VITALS — BP 120/60 | HR 81 | Temp 97.9°F | Ht 67.0 in | Wt 151.1 lb

## 2014-11-19 DIAGNOSIS — F17210 Nicotine dependence, cigarettes, uncomplicated: Secondary | ICD-10-CM

## 2014-11-19 DIAGNOSIS — E119 Type 2 diabetes mellitus without complications: Secondary | ICD-10-CM | POA: Diagnosis not present

## 2014-11-19 DIAGNOSIS — E669 Obesity, unspecified: Secondary | ICD-10-CM

## 2014-11-19 DIAGNOSIS — D696 Thrombocytopenia, unspecified: Secondary | ICD-10-CM | POA: Diagnosis not present

## 2014-11-19 DIAGNOSIS — E782 Mixed hyperlipidemia: Secondary | ICD-10-CM | POA: Diagnosis not present

## 2014-11-19 DIAGNOSIS — R7989 Other specified abnormal findings of blood chemistry: Secondary | ICD-10-CM

## 2014-11-19 DIAGNOSIS — E7220 Disorder of urea cycle metabolism, unspecified: Secondary | ICD-10-CM

## 2014-11-19 DIAGNOSIS — Z720 Tobacco use: Secondary | ICD-10-CM

## 2014-11-19 DIAGNOSIS — G9340 Encephalopathy, unspecified: Secondary | ICD-10-CM

## 2014-11-19 DIAGNOSIS — J449 Chronic obstructive pulmonary disease, unspecified: Secondary | ICD-10-CM

## 2014-11-19 DIAGNOSIS — E1169 Type 2 diabetes mellitus with other specified complication: Secondary | ICD-10-CM

## 2014-11-19 LAB — AMMONIA: Ammonia: 78 umol/L — ABNORMAL HIGH (ref 16–53)

## 2014-11-19 MED ORDER — BUPROPION HCL ER (SR) 150 MG PO TB12: 150.0000 mg | ORAL_TABLET | Freq: Two times a day (BID) | ORAL | Status: DC

## 2014-11-19 MED ORDER — BUPROPION HCL ER (XL) 300 MG PO TB24: 300.0000 mg | ORAL_TABLET | Freq: Every day | ORAL | Status: DC

## 2014-11-19 NOTE — Progress Notes (Signed)
Ashley Savage  [id number] 27-Nov-1940 11/19/2014      Progress Note-Follow Up  Subjective  Chief Complaint  Chief Complaint  Patient presents with  . Follow-up    2 week    HPI  Patient is a 74 y.o. female in today for routine medical care. Patient is in today for follow-up. Is at her baseline state of health. Denies any mental status changes or acute complaints. No recent shortness of breath, palpitations. Does acknowledge some intermittent chest pain. No correlation with eating or exertion. Says it happens roughly daily but resolves on its on. Patient has stopped lactulose and does feel a she dropped her sugars on Glimeperide. Denies polyuria or polydipsia. Denies CP/palp/SOB/HA/congestion/fevers or GU c/o. Taking meds as prescribed  Past Medical History  Diagnosis Date  . Emphysema   . Diabetes mellitus type 2  . Hyperlipidemia   . Cancer breast ca  right  . Anxiety   . Panic attacks   . Depression   . Arthritis of both knees 10/01/2013  . Benign paroxysmal positional vertigo 10/01/2013  . Neck pain 10/01/2013  . Esophageal reflux 10/01/2013  . Pedal edema 12/10/2013  . Overactive bladder 12/10/2013  . Tobacco abuse disorder 02/01/2014  . COPD (chronic obstructive pulmonary disease) 10/01/2013  . Serum ammonia increased 11/10/2014    Past Surgical History  Procedure Laterality Date  . Gallbladder surgery  1992  . Breast surgery  2009 right  . Appendectomy  2007  . Knee surgery    . Mandible fracture surgery    . Pilonidal cyst excision    . Tonsillectomy    . Cataract extraction      Family History  Problem Relation Age of Onset  . Heart failure Father   . COPD Father   . Arthritis Father 21  . Pneumonia Sister   . Breast cancer    . Stroke Mother   . Arthritis Mother 28  . Hyperlipidemia Mother   . Hypertension Mother   . Diabetes Mother   . Breast cancer Maternal Aunt   . Alcohol abuse Maternal Uncle     History   Social History  . Marital Status: Single      Spouse Name: N/A  . Number of Children: 0  . Years of Education: N/A   Occupational History  . retire    Social History Main Topics  . Smoking status: Current Some Day Smoker -- 0.05 packs/day    Types: Cigarettes    Start date: 07/27/1964  . Smokeless tobacco: Never Used     Comment: No smoking x 2 wks on 10/29/14  . Alcohol Use: No  . Drug Use: No  . Sexual Activity: Yes    Birth Control/ Protection: Post-menopausal   Other Topics Concern  . Not on file   Social History Narrative    Current Outpatient Prescriptions on File Prior to Visit  Medication Sig Dispense Refill  . albuterol (PROVENTIL HFA;VENTOLIN HFA) 108 (90 BASE) MCG/ACT inhaler Inhale 2 puffs into the lungs every 6 (six) hours as needed for wheezing or shortness of breath. Only dispense Ventolin 1 Inhaler 3  . aspirin EC 81 MG tablet Take 1 tablet (81 mg total) by mouth daily. 90 tablet 3  . Cholecalciferol (VITAMIN D3) 2000 UNITS TABS Take 1 capsule by mouth daily.     . diazepam (VALIUM) 5 MG tablet Take 0.5 tablets (2.5 mg total) by mouth daily as needed for anxiety or muscle spasms. Takes 1/2-1 tablets as needed for  anxiety 30 tablet 3  . Fluticasone-Salmeterol (ADVAIR) 250-50 MCG/DOSE AEPB Inhale 1 puff into the lungs 2 (two) times daily.    . furosemide (LASIX) 20 MG tablet Take 1 tablet (20 mg total) by mouth every Monday, Wednesday, and Friday. (Patient taking differently: Take 20 mg by mouth 3 (three) times a week. ) 30 tablet 3  . glimepiride (AMARYL) 1 MG tablet Take 1 tablet (1 mg total) by mouth daily with breakfast. 30 tablet 2  . glucose blood test strip Use as directed twice daily to check blood sugar.  Diagnosis code E11.9 100 each 6  . ipratropium-albuterol (DUONEB) 0.5-2.5 (3) MG/3ML SOLN Take 3 mLs by nebulization every 4 (four) hours as needed. 360 mL 1  . lactulose (CHRONULAC) 10 GM/15ML solution Take 45 mLs (30 g total) by mouth 2 (two) times daily as needed for mild constipation. 1892 mL 1   . omeprazole (PRILOSEC) 20 MG capsule Take 1 capsule (20 mg total) by mouth daily. 30 capsule 3   No current facility-administered medications on file prior to visit.    Allergies  Allergen Reactions  . Citalopram     Confusion, irregular heart beat.  . Erythromycin     Stomach cramps  . Versed [Midazolam] Other (See Comments)    Patient stayed confusion stayed 4+days     Review of Systems  Review of Systems  Constitutional: Negative for fever and malaise/fatigue.  HENT: Negative for congestion.   Eyes: Negative for discharge.  Respiratory: Negative for shortness of breath.   Cardiovascular: Negative for chest pain, palpitations and leg swelling.  Gastrointestinal: Negative for nausea, abdominal pain and diarrhea.  Genitourinary: Negative for dysuria.  Musculoskeletal: Negative for falls.  Skin: Negative for rash.  Neurological: Negative for loss of consciousness and headaches.  Endo/Heme/Allergies: Negative for polydipsia.  Psychiatric/Behavioral: Negative for depression and suicidal ideas. The patient is nervous/anxious. The patient does not have insomnia.     Objective  BP 120/60 mmHg  Pulse 81  Temp(Src) 97.9 F (36.6 C) (Oral)  Ht 5\' 7"  (1.702 m)  Wt 151 lb 2 oz (68.55 kg)  BMI 23.66 kg/m2  SpO2 97%  Physical Exam  Physical Exam  Constitutional: She is oriented to person, place, and time and well-developed, well-nourished, and in no distress. No distress.  HENT:  Head: Normocephalic and atraumatic.  Right Ear: External ear normal.  Left Ear: External ear normal.  Nose: Nose normal.  Mouth/Throat: Oropharynx is clear and moist. No oropharyngeal exudate.  Eyes: Conjunctivae are normal. Pupils are equal, round, and reactive to light. Right eye exhibits no discharge. Left eye exhibits no discharge. No scleral icterus.  Neck: Normal range of motion. Neck supple. No thyromegaly present.  Cardiovascular: Normal rate, regular rhythm, normal heart sounds and  intact distal pulses.   No murmur heard. Pulmonary/Chest: Effort normal and breath sounds normal. No respiratory distress. She has no wheezes. She has no rales.  Abdominal: Soft. Bowel sounds are normal. She exhibits no distension and no mass. There is no tenderness.  Musculoskeletal: Normal range of motion. She exhibits no edema or tenderness.  Lymphadenopathy:    She has no cervical adenopathy.  Neurological: She is alert and oriented to person, place, and time. She has normal reflexes. No cranial nerve deficit. Coordination normal.  Skin: Skin is warm and dry. No rash noted. She is not diaphoretic.  Psychiatric: Mood, memory and affect normal.    Lab Results  Component Value Date   TSH 0.864 10/16/2014   Lab  Results  Component Value Date   WBC 5.0 10/26/2014   HGB 12.2 10/26/2014   HCT 36.6 10/26/2014   MCV 91.1 10/26/2014   PLT 112.0* 10/26/2014   Lab Results  Component Value Date   CREATININE 0.64 11/02/2014   BUN 13 11/02/2014   NA 140 11/02/2014   K 4.2 11/02/2014   CL 102 11/02/2014   CO2 32 11/02/2014   Lab Results  Component Value Date   ALT 27 11/02/2014   AST 36 11/02/2014   ALKPHOS 104 11/02/2014   BILITOT 1.3* 11/02/2014   Lab Results  Component Value Date   CHOL 167 04/12/2014   Lab Results  Component Value Date   HDL 52.30 04/12/2014   Lab Results  Component Value Date   LDLCALC 98 04/12/2014   Lab Results  Component Value Date   TRIG 83.0 04/12/2014   Lab Results  Component Value Date   CHOLHDL 3 04/12/2014     Assessment & Plan  Hyperlipidemia, mixed Encouraged heart healthy diet, increase exercise, avoid trans fats. 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 No recent flares   Diabetes mellitus type 2, controlled Did not tolerate Metformin but sugar doing adequately off of Metformin.    Acute encephalopathy No further episodes since recent  hospitalization   Serum ammonia increased Flared again wihtout Lactulose, will restart and reassess   Tobacco abuse disorder Encouraged complete cessation. Discussed need to quit as relates to risk of numerous cancers, cardiac and pulmonary disease as well as neurologic complications. Counseled for greater than 3 minutes

## 2014-11-19 NOTE — Assessment & Plan Note (Addendum)
Encouraged heart healthy diet, increase exercise, avoid trans fats. Referred to nutrition for further consideration

## 2014-11-19 NOTE — Progress Notes (Signed)
Pre visit review using our clinic review tool, if applicable. No additional management support is needed unless otherwise documented below in the visit note. 

## 2014-11-19 NOTE — Patient Instructions (Signed)

## 2014-11-20 ENCOUNTER — Telehealth: Payer: Self-pay | Admitting: Family Medicine

## 2014-11-20 ENCOUNTER — Other Ambulatory Visit: Payer: Self-pay | Admitting: Family Medicine

## 2014-11-20 DIAGNOSIS — E119 Type 2 diabetes mellitus without complications: Secondary | ICD-10-CM

## 2014-11-20 DIAGNOSIS — E782 Mixed hyperlipidemia: Secondary | ICD-10-CM

## 2014-11-20 DIAGNOSIS — D696 Thrombocytopenia, unspecified: Secondary | ICD-10-CM

## 2014-11-20 LAB — CBC
HCT: 37 % (ref 36.0–46.0)
Hemoglobin: 12.4 g/dL (ref 12.0–15.0)
MCHC: 33.5 g/dL (ref 30.0–36.0)
MCV: 91.1 fl (ref 78.0–100.0)
PLATELETS: 124 10*3/uL — AB (ref 150.0–400.0)
RBC: 4.07 Mil/uL (ref 3.87–5.11)
RDW: 15.4 % (ref 11.5–15.5)
WBC: 4.2 10*3/uL (ref 4.0–10.5)

## 2014-11-20 LAB — COMPREHENSIVE METABOLIC PANEL
ALT: 23 U/L (ref 0–35)
AST: 31 U/L (ref 0–37)
Albumin: 3.2 g/dL — ABNORMAL LOW (ref 3.5–5.2)
Alkaline Phosphatase: 103 U/L (ref 39–117)
BUN: 16 mg/dL (ref 6–23)
CO2: 31 meq/L (ref 19–32)
CREATININE: 0.66 mg/dL (ref 0.40–1.20)
Calcium: 9.2 mg/dL (ref 8.4–10.5)
Chloride: 103 mEq/L (ref 96–112)
GFR: 93.67 mL/min (ref >60.00)
Glucose, Bld: 158 mg/dL — ABNORMAL HIGH (ref 70–99)
Potassium: 4.3 mEq/L (ref 3.5–5.1)
Sodium: 138 mEq/L (ref 135–145)
Total Bilirubin: 1 mg/dL (ref 0.2–1.2)
Total Protein: 5.6 g/dL — ABNORMAL LOW (ref 6.0–8.3)

## 2014-11-20 NOTE — Telephone Encounter (Signed)
Patient stated Ashley Savage had been off cholesterol and diabetes meds. Before doing labs.  States her blood sugar is the same on or off diabetes medication.  Ashley Savage wonders if the diabetes medication could cause the ammonia problem, Ashley Savage states Dr. Joya Gaskins took her off diabetes and cholesterol meds. For one week and ammonia was normal.  Advise please

## 2014-11-20 NOTE — Telephone Encounter (Signed)
Relation to pt: self Call back number: [phone] Pharmacy:  Reason for call:  Pt states what is she suppose to eat, pt was advised to cut protein and she has diabetes therefore she cant eats carbs in need of clinical advice

## 2014-11-20 NOTE — Telephone Encounter (Signed)
She is still off the meds that Pulm recommended she stop. She is welcome to hold the new Amaryl for one week til we recheck but this is unlikely to the cause

## 2014-11-21 NOTE — Telephone Encounter (Signed)
Called [phone] (Home) and left message to return call.

## 2014-11-22 ENCOUNTER — Encounter: Payer: Self-pay | Admitting: Family Medicine

## 2014-11-22 NOTE — Telephone Encounter (Signed)
Called the patient informed of PCP instructions on medication.

## 2014-11-25 ENCOUNTER — Encounter: Payer: Self-pay | Admitting: Family Medicine

## 2014-11-25 DIAGNOSIS — R7989 Other specified abnormal findings of blood chemistry: Secondary | ICD-10-CM

## 2014-11-25 HISTORY — DX: Other specified abnormal findings of blood chemistry: R79.89

## 2014-11-25 NOTE — Assessment & Plan Note (Signed)
Did not tolerate Metformin but sugar doing adequately off of Metformin.

## 2014-11-25 NOTE — Assessment & Plan Note (Signed)
No recent flares 

## 2014-11-25 NOTE — Assessment & Plan Note (Signed)
Flared again wihtout Lactulose, will restart and reassess

## 2014-11-25 NOTE — Assessment & Plan Note (Signed)
No further episodes since recent hospitalization

## 2014-11-25 NOTE — Assessment & Plan Note (Signed)
Encouraged complete cessation. Discussed need to quit as relates to risk of numerous cancers, cardiac and pulmonary disease as well as neurologic complications. Counseled for greater than 3 minutes 

## 2014-11-28 ENCOUNTER — Other Ambulatory Visit (INDEPENDENT_AMBULATORY_CARE_PROVIDER_SITE_OTHER): Payer: Medicare HMO

## 2014-11-28 DIAGNOSIS — E119 Type 2 diabetes mellitus without complications: Secondary | ICD-10-CM

## 2014-11-28 DIAGNOSIS — D696 Thrombocytopenia, unspecified: Secondary | ICD-10-CM

## 2014-11-28 DIAGNOSIS — E782 Mixed hyperlipidemia: Secondary | ICD-10-CM

## 2014-11-28 LAB — CBC WITH DIFFERENTIAL/PLATELET
Basophils Absolute: 0 10*3/uL (ref 0.0–0.1)
Basophils Relative: 0.7 % (ref 0.0–3.0)
EOS PCT: 2.8 % (ref 0.0–5.0)
Eosinophils Absolute: 0.1 10*3/uL (ref 0.0–0.7)
HCT: 36.7 % (ref 36.0–46.0)
Hemoglobin: 12.3 g/dL (ref 12.0–15.0)
Lymphocytes Relative: 28.3 % (ref 12.0–46.0)
Lymphs Abs: 1.1 10*3/uL (ref 0.7–4.0)
MCHC: 33.5 g/dL (ref 30.0–36.0)
MCV: 91 fl (ref 78.0–100.0)
Monocytes Absolute: 0.6 10*3/uL (ref 0.1–1.0)
Monocytes Relative: 15.9 % — ABNORMAL HIGH (ref 3.0–12.0)
NEUTROS PCT: 52.3 % (ref 43.0–77.0)
Neutro Abs: 2.1 10*3/uL (ref 1.4–7.7)
Platelets: 111 10*3/uL — ABNORMAL LOW (ref 150.0–400.0)
RBC: 4.03 Mil/uL (ref 3.87–5.11)
RDW: 15.7 % — ABNORMAL HIGH (ref 11.5–15.5)
WBC: 3.9 10*3/uL — AB (ref 4.0–10.5)

## 2014-11-28 LAB — COMPREHENSIVE METABOLIC PANEL
ALK PHOS: 103 U/L (ref 39–117)
ALT: 23 U/L (ref 0–35)
AST: 27 U/L (ref 0–37)
Albumin: 3 g/dL — ABNORMAL LOW (ref 3.5–5.2)
BUN: 10 mg/dL (ref 6–23)
CO2: 34 meq/L — AB (ref 19–32)
Calcium: 8.9 mg/dL (ref 8.4–10.5)
Chloride: 103 mEq/L (ref 96–112)
Creatinine, Ser: 0.72 mg/dL (ref 0.40–1.20)
GFR: 84.71 mL/min (ref >60.00)
Glucose, Bld: 238 mg/dL — ABNORMAL HIGH (ref 70–99)
Potassium: 4.2 mEq/L (ref 3.5–5.1)
SODIUM: 139 meq/L (ref 135–145)
TOTAL PROTEIN: 5.7 g/dL — AB (ref 6.0–8.3)
Total Bilirubin: 1.2 mg/dL (ref 0.2–1.2)

## 2014-11-28 LAB — AMMONIA: Ammonia: 101 umol/L — ABNORMAL HIGH (ref 16–53)

## 2014-11-28 NOTE — Addendum Note (Signed)
Addended by: Harl Bowie on: 11/28/2014 10:00 AM   Modules accepted: Orders

## 2014-11-30 ENCOUNTER — Telehealth: Payer: Self-pay | Admitting: Family Medicine

## 2014-11-30 DIAGNOSIS — E785 Hyperlipidemia, unspecified: Secondary | ICD-10-CM

## 2014-11-30 NOTE — Telephone Encounter (Signed)
Patient returned phone call. [facility] # [phone]

## 2014-11-30 NOTE — Telephone Encounter (Signed)
-----   Message from Mosie Lukes, MD sent at 11/29/2014 10:12 PM EDT ----- If she is unwilling to increase, then please find which GI she is seeing and see if she is willing to let me forward her results to them for review and further instructions. Dr B ----- Message -----    From: Donell Sievert Ewing, CMA    Sent: 11/29/2014  10:12 AM      To: Mosie Lukes, MD  I did speak to her this morning. She stated she was seen on Tuesday 11/27/14 by a GI MD in [facility] and was informed to take lactulose only twice daily.  She is not going to do lactulose three times daily as stated was doing nothing but passing water and is why the GI MD told her to decrease to twice daily.  This patient gets very emotional when speaking to her in regards to this situation.   Thanks, Shirlean Mylar ----- Message -----    From: Mosie Lukes, MD    Sent: 11/28/2014   9:46 PM      To: Donell Sievert Ewing, CMA  Not sure I routed th message on this patient correctly her ammonia is up she needs the gastroenterology consult and and she needs to increase her Lactulose 30 gm po increased to tid

## 2014-11-30 NOTE — Telephone Encounter (Signed)
Patient would like to know if she should restart diabetes medication

## 2014-11-30 NOTE — Telephone Encounter (Signed)
Called the patient and she was seen by Dr. Colen [facility]. Patient was just seen by him this past Tuesday. Let me know and can get the fax number and fax over results that you want sent.

## 2014-11-30 NOTE — Telephone Encounter (Signed)
So please send him over a copy of her labs and recent hospital discharge as well as our last note.I was unaware she was seeing a gastroenterologist outside of our system. For sugar I would avoid Metformin and she should consider insulin much safer given her situation. If she is willing have her come in next week for teaching if she is willing and I can get her started

## 2014-11-30 NOTE — Telephone Encounter (Signed)
Labs, notes sent to GI MD in [facility]. Called the patient left message to call back

## 2014-11-30 NOTE — Telephone Encounter (Signed)
The patient also stated he is to be getting all her records from the hospital and has a followup appt. Once receiving those results to discuss.

## 2014-11-30 NOTE — Telephone Encounter (Signed)
Called the patient left message to call back 

## 2014-12-03 NOTE — Telephone Encounter (Signed)
Have her d/c Glipizide also, then after 3 days have her have her ammonia checked again and let us k now what her sugars have been. If she looks better then will have her start Lantus 10 units each am, can have a nurse visit to learn how to take the Lantus. Disp pens, disp #5 pens. 1 rf

## 2014-12-03 NOTE — Telephone Encounter (Signed)
Put order in for Ammonia to be checked in the lab and scheduled her appt. For Friday at 10 in the lab.  Did inform of PCP instructions and the patient did verbalize understanding of all information

## 2014-12-03 NOTE — Telephone Encounter (Signed)
Patient returned phone call. Best # [phone]

## 2014-12-03 NOTE — Telephone Encounter (Signed)
Called the patient and she is not on metformin, but on glimepiride, taking 1/2 am and 1/2 pm, yesterday morning BS 156 and before bed 149, and this morning 117. Advise if still needs to come in and start insulin?? Also patient was unable to Take Wellbutrin Caused dizziness.  Took only one day.

## 2014-12-07 ENCOUNTER — Telehealth: Payer: Self-pay | Admitting: Family Medicine

## 2014-12-07 ENCOUNTER — Telehealth: Payer: Self-pay | Admitting: *Deleted

## 2014-12-07 ENCOUNTER — Other Ambulatory Visit (INDEPENDENT_AMBULATORY_CARE_PROVIDER_SITE_OTHER): Payer: Medicare HMO

## 2014-12-07 DIAGNOSIS — R7989 Other specified abnormal findings of blood chemistry: Secondary | ICD-10-CM | POA: Diagnosis not present

## 2014-12-07 NOTE — Telephone Encounter (Signed)
Caller name: Annalis Relation to pt: self Call back number: [phone] Pharmacy:  Reason for call: Pt came in office for lab appt and brought in paper with her bp result for each day asked from Dr. Charlett Blake. Pt also stated just wanting to inform that where she was referred too, that she is being sent to [facility] where they can have a study done on her.

## 2014-12-07 NOTE — Telephone Encounter (Signed)
Received home health certification and plan of care via fax from [facility]. Forwarded to Dr. Blyth. JG//CMA 

## 2014-12-08 LAB — AMMONIA: Ammonia: 104 umol/L — ABNORMAL HIGH (ref 16–53)

## 2014-12-11 NOTE — Telephone Encounter (Signed)
Faxed successfully to [facility]. Sent for scanning. JG//CMA

## 2014-12-17 ENCOUNTER — Encounter: Payer: Self-pay | Admitting: Internal Medicine

## 2014-12-20 ENCOUNTER — Ambulatory Visit (INDEPENDENT_AMBULATORY_CARE_PROVIDER_SITE_OTHER): Payer: Medicare HMO | Admitting: Family Medicine

## 2014-12-20 ENCOUNTER — Encounter: Payer: Self-pay | Admitting: Family Medicine

## 2014-12-20 VITALS — BP 120/62 | HR 94 | Temp 98.2°F | Ht 67.0 in | Wt 146.1 lb

## 2014-12-20 DIAGNOSIS — E119 Type 2 diabetes mellitus without complications: Secondary | ICD-10-CM

## 2014-12-20 DIAGNOSIS — J449 Chronic obstructive pulmonary disease, unspecified: Secondary | ICD-10-CM

## 2014-12-20 DIAGNOSIS — R7989 Other specified abnormal findings of blood chemistry: Secondary | ICD-10-CM | POA: Diagnosis not present

## 2014-12-20 DIAGNOSIS — R197 Diarrhea, unspecified: Secondary | ICD-10-CM | POA: Diagnosis not present

## 2014-12-20 DIAGNOSIS — Z720 Tobacco use: Secondary | ICD-10-CM

## 2014-12-20 DIAGNOSIS — K219 Gastro-esophageal reflux disease without esophagitis: Secondary | ICD-10-CM | POA: Diagnosis not present

## 2014-12-20 LAB — AMMONIA: Ammonia: 66 umol/L — ABNORMAL HIGH (ref 16–53)

## 2014-12-20 NOTE — Patient Instructions (Signed)
Drop Lactulose to once daily Add Miralax daily   Rel  Of records for [facility] in [place]  Ammonia, Plasma Ammonia This is a test to detect elevated levels of ammonia in the blood, to evaluate changes in consciousness, or to help diagnose hepatic encephalopathy and Reye syndrome. It may be ordered when a patient experiences mental changes or lapses into a coma of unknown origin, if an infant or child experiences frequent vomiting and increased lethargy as a newborn, or about a week after a viral illness. Ammonia is a compound produced by intestinal bacteria and by cells in the body during the digestion of protein. Ammonia is a waste product that the liver changes into urea and glutamine. The urea is then carried by the blood to the kidneys, where it is put out in the urine. If this "urea cycle" does not complete, ammonia builds up in the blood. This also happens when you cannot put out urine (kidney failure) or when your liver does not work (hepatic failure). A buildup of ammonia in the body can cause mental and neurological changes that can lead to confusion, disorientation, sleepiness, and eventually to coma and even death. Infants and children with increased ammonia levels may vomit frequently, be irritable, and be increasingly lethargic. Left untreated, they may experience seizures, respiratory difficulty, and may lapse into a coma and die. PREPARATION FOR TEST No preparation or fasting is necessary. A blood sample is taken by a needle from a vein.  Avoid exercising before this test. NORMAL FINDINGS  Normal values depend on the method used for testing. Test results depend on many factors including age, sex, etc. Your lab report should include the specific reference range for your test. Your caregiver will go over you test results with you.  Adults: 10-80 mcg/dL (6-47 micromole/L)  Neonates, 0 to 10 days (enzymatic): 170-341 mcg/dL (100-200 micromole/L)  Infants and toddlers, 10 days to  2 years (enzymatic): 68-136 mcg/dL (40-80 micromole/L)  Children, older than 2 years (enzymatic): 19-60 mcg/dL (11-35 micromole/L) Ranges for normal findings may vary among different laboratories and hospitals. You should always check with your doctor after having lab work or other tests done to discuss the meaning of your test results and whether your values are considered within normal limits. MEANING OF TEST  Your caregiver will go over the test results with you and discuss the importance and meaning of your results, as well as treatment options and the need for additional tests if necessary. OBTAINING THE TEST RESULTS  It is your responsibility to obtain your test results. Ask the lab or department performing the test when and how you will get your results. Document Released: 08/04/2004 Document Revised: 11/27/2013 Document Reviewed: 06/18/2008 [facility] Patient Information 2015 [place], [place]. This information is not intended to replace advice given to you by your health care provider. Make sure you discuss any questions you have with your health care provider.

## 2014-12-20 NOTE — Progress Notes (Signed)
Pre visit review using our clinic review tool, if applicable. No additional management support is needed unless otherwise documented below in the visit note. 

## 2014-12-21 LAB — COMPREHENSIVE METABOLIC PANEL
ALT: 24 U/L (ref 0–35)
AST: 32 U/L (ref 0–37)
Albumin: 3.4 g/dL — ABNORMAL LOW (ref 3.5–5.2)
Alkaline Phosphatase: 108 U/L (ref 39–117)
BUN: 14 mg/dL (ref 6–23)
CO2: 33 meq/L — AB (ref 19–32)
Calcium: 9.1 mg/dL (ref 8.4–10.5)
Chloride: 102 mEq/L (ref 96–112)
Creatinine, Ser: 0.67 mg/dL (ref 0.40–1.20)
GFR: 92.03 mL/min (ref >60.00)
Glucose, Bld: 145 mg/dL — ABNORMAL HIGH (ref 70–99)
Potassium: 4.1 mEq/L (ref 3.5–5.1)
SODIUM: 138 meq/L (ref 135–145)
Total Bilirubin: 1.2 mg/dL (ref 0.2–1.2)
Total Protein: 6 g/dL (ref 6.0–8.3)

## 2014-12-21 LAB — CBC
HCT: 40.8 % (ref 36.0–46.0)
Hemoglobin: 13.5 g/dL (ref 12.0–15.0)
MCHC: 33 g/dL (ref 30.0–36.0)
MCV: 91.8 fl (ref 78.0–100.0)
PLATELETS: 135 10*3/uL — AB (ref 150.0–400.0)
RBC: 4.45 Mil/uL (ref 3.87–5.11)
RDW: 14.8 % (ref 11.5–15.5)
WBC: 4.8 10*3/uL (ref 4.0–10.5)

## 2014-12-21 LAB — SEDIMENTATION RATE: SED RATE: 1 mm/h (ref 0–22)

## 2014-12-25 ENCOUNTER — Ambulatory Visit: Payer: Medicare HMO | Admitting: Family Medicine

## 2014-12-26 ENCOUNTER — Encounter: Payer: Self-pay | Admitting: Dietician

## 2014-12-26 ENCOUNTER — Encounter: Payer: Medicare HMO | Attending: Family Medicine | Admitting: Dietician

## 2014-12-26 VITALS — Ht 67.0 in | Wt 146.0 lb

## 2014-12-26 DIAGNOSIS — E119 Type 2 diabetes mellitus without complications: Secondary | ICD-10-CM | POA: Diagnosis present

## 2014-12-26 DIAGNOSIS — Z713 Dietary counseling and surveillance: Secondary | ICD-10-CM | POA: Diagnosis not present

## 2014-12-26 LAB — URINALYSIS, ROUTINE W REFLEX MICROSCOPIC
Bilirubin Urine: NEGATIVE
Hgb urine dipstick: NEGATIVE
Ketones, ur: NEGATIVE
LEUKOCYTES UA: NEGATIVE
Nitrite: POSITIVE — AB
PH: 6 (ref 5.0–8.0)
Specific Gravity, Urine: 1.02 (ref 1.000–1.030)
Total Protein, Urine: NEGATIVE
UROBILINOGEN UA: 1 (ref 0.0–1.0)
Urine Glucose: NEGATIVE

## 2014-12-26 NOTE — Patient Instructions (Addendum)
Aim to eat 3 meals per day and 2-3 snacks if needed . Avoid not eating for more than 5 hours in a row.  For breakfast at home, have cereal or fruit with an egg. Try making coffee and chilling and adding milk it to have instead of frappaccino.  Have protein with a carb for each meal or snack.  Limit frappacino and orange juice (don't have every day). Fill half of your plate with vegetables, a quarter of your plate protein, and a quarter of your plate carbohydrates. [facility] questions are [facility] (swelling and leakage).

## 2014-12-26 NOTE — Progress Notes (Signed)
Medical Nutrition Therapy:  Appt start time: 0935 end time:  1045.   Assessment:  Primary concerns today: Referred today for diabetes. Testing blood sugar 2 x day usually 100-110 in the morning and around 160 mg/dl in the evening. Has only gone over 180 mg/dl only one time. Last Hgb A1c was in 9/15 and it was 6.8%.  Not taking any diabetes medications at this time. Has had diabetes for 15 years. Used to take metformin but no longer takes that d/t to high ammonia levels/possible liver problems. Was in the hospital for 5 days in March. Had an ultrasound of the liver a week ago and is waiting to hear back about a diagnosis. Taking lactulose 1 x day.  Is limiting protein from meat and eating more carbohydrates to help with liver. Has not noticed an impact to her diabetes. Had been limiting carbohydrates for diabetes before this. Still avoids sweets for the most part.   Would like to have help with what to eat. Tends to skip about 7 meals per week (lunch) or has yogurt. If she gets upset will not eat anything. Eats out about 3 x week. Has vouchers to eat at [employer] (senior program). Lives by herself. Eats meals that are frozen/microwavable.   Has been losing weight since being diagnosed with breast cancer about 8 years ago. Has had doctors and friends tell her that she does not eat enough. Weight loss has stabilized some. Thinks she eats about 1400-1500 calories per day.  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  Preferred Learning Style:   No preference indicated   Learning Readiness:   Ready  MEDICATIONS: see list   DIETARY INTAKE:  Usual eating pattern includes 2-3 meals and 1-2 snacks per day.  Avoided foods include: peppers, onions, hot dogs, limiting some meat  24-hr recall:  B ( AM): 1 x week western omelette cheese and ham or cereal with fruit (corn flakes) or toast with bagel sometimes with bottled frappachino or orange juice or diet mountain dew Snk ( AM): none L ( PM):  peanut butter sandwich with banana, yogurt  Snk ( PM): 1/2 peanut butter sandwich  D ( PM): tuna sandwich from [employer], ham sandwiches, sometimes spaghetti with salad, peaches, and unsweet or half and half tea Snk ( PM): trail mix with edamame  Beverages:  Frappachino, water, flavored water, orange juice or diet mountain dew  Usual physical activity: very little - has a bad knee and has little energy   Estimated energy needs: 1600 calories 180 g carbohydrates 120 g protein 44 g fat  Progress Towards Goal(s):  In progress.   Nutritional Diagnosis:  [place]-2.1 Inpaired nutrition utilization As related to glucose metabolism.  As evidenced by Hgb A1c of 6.8%.    Intervention:  Nutrition counseling provided. Plan: Aim to eat 3 meals per day and 2-3 snacks if needed . Avoid not eating for more than 5 hours in a row.  For breakfast at home, have cereal or fruit with an egg. Try making coffee and chilling and adding milk it to have instead of frappaccino.  Have protein with a carb for each meal or snack.  Limit frappacino and orange juice (don't have every day). Fill half of your plate with vegetables, a quarter of your plate protein, and a quarter of your plate carbohydrates. [facility] once [facility] questions are answers (swelling and leakage).  Teaching Method Utilized:  Visual Auditory Hands on  Handouts given  during visit include:  MyPlate Handout  15 g CHO Snacks  Barriers to learning/adherence to lifestyle change: chronic health conditions, doesn't cook, low energy  Demonstrated degree of understanding via:  Teach Back   Monitoring/Evaluation:  Dietary intake, exercise, and body weight prn.

## 2014-12-27 LAB — OVA AND PARASITE EXAMINATION: OP: NONE SEEN

## 2014-12-27 LAB — FECAL LACTOFERRIN, QUANT: Lactoferrin: NEGATIVE

## 2014-12-27 LAB — CLOSTRIDIUM DIFFICILE BY PCR: Toxigenic C. Difficile by PCR: NOT DETECTED

## 2014-12-28 LAB — OVA AND PARASITE EXAMINATION: OP: NONE SEEN

## 2014-12-28 LAB — URINE CULTURE

## 2014-12-30 ENCOUNTER — Encounter: Payer: Self-pay | Admitting: Family Medicine

## 2014-12-30 DIAGNOSIS — R197 Diarrhea, unspecified: Secondary | ICD-10-CM | POA: Insufficient documentation

## 2014-12-30 LAB — STOOL CULTURE

## 2014-12-30 NOTE — Progress Notes (Signed)
Ashley Savage  [id number] 19-Sep-1940 12/30/2014      Progress Note-Follow Up  Subjective  Chief Complaint  Chief Complaint  Patient presents with  . Follow-up    HPI  Patient is a 74 y.o. female in today for routine medical care. Patient is in today for follow-up. Her greatest complaint is fatigue and no energy.no abdominal pain, fevers or chills. She is eating well. Unfortunately she continues to smoke. She has been seen by nephrology and gastroenterology now and awaits further workup. Notes blood sugars have improved somewhat but she does occasionally still see a number above 200. Polydipsia is noted but no urinary frequency. Denies CP/palp/SOB/HA/congestion/fevers/GI c/o.  Past Medical History  Diagnosis Date  . Emphysema   . Diabetes mellitus type 2  . Hyperlipidemia   . Cancer breast ca  right  . Anxiety   . Panic attacks   . Depression   . Arthritis of both knees 10/01/2013  . Benign paroxysmal positional vertigo 10/01/2013  . Neck pain 10/01/2013  . Esophageal reflux 10/01/2013  . Pedal edema 12/10/2013  . Overactive bladder 12/10/2013  . Tobacco abuse disorder 02/01/2014  . COPD (chronic obstructive pulmonary disease) 10/01/2013  . Serum ammonia increased 11/10/2014  . Hyperlipidemia, mixed   . Increased ammonia level 11/25/2014    Past Surgical History  Procedure Laterality Date  . Gallbladder surgery  1992  . Breast surgery  2009 right  . Appendectomy  2007  . Knee surgery    . Mandible fracture surgery    . Pilonidal cyst excision    . Tonsillectomy    . Cataract extraction      Family History  Problem Relation Age of Onset  . Heart failure Father   . COPD Father   . Arthritis Father 21  . Pneumonia Sister   . Breast cancer    . Stroke Mother   . Arthritis Mother 27  . Hyperlipidemia Mother   . Hypertension Mother   . Diabetes Mother   . Breast cancer Maternal Aunt   . Alcohol abuse Maternal Uncle     History   Social History  . Marital Status: Single     Spouse Name: N/A  . Number of Children: 0  . Years of Education: N/A   Occupational History  . retire    Social History Main Topics  . Smoking status: Current Some Day Smoker -- 0.05 packs/day    Types: Cigarettes    Start date: 07/27/1964  . Smokeless tobacco: Never Used     Comment: No smoking x 2 wks on 10/29/14  . Alcohol Use: No  . Drug Use: No  . Sexual Activity: Yes    Birth Control/ Protection: Post-menopausal   Other Topics Concern  . Not on file   Social History Narrative    Current Outpatient Prescriptions on File Prior to Visit  Medication Sig Dispense Refill  . albuterol (PROVENTIL HFA;VENTOLIN HFA) 108 (90 BASE) MCG/ACT inhaler Inhale 2 puffs into the lungs every 6 (six) hours as needed for wheezing or shortness of breath. Only dispense Ventolin 1 Inhaler 3  . aspirin EC 81 MG tablet Take 1 tablet (81 mg total) by mouth daily. 90 tablet 3  . Cholecalciferol (VITAMIN D3) 2000 UNITS TABS Take 1 capsule by mouth daily.     . diazepam (VALIUM) 5 MG tablet Take 0.5 tablets (2.5 mg total) by mouth daily as needed for anxiety or muscle spasms. Takes 1/2-1 tablets as needed for anxiety 30 tablet 3  .  Fluticasone-Salmeterol (ADVAIR) 250-50 MCG/DOSE AEPB Inhale 1 puff into the lungs 2 (two) times daily.    . furosemide (LASIX) 20 MG tablet Take 1 tablet (20 mg total) by mouth every Monday, Wednesday, and Friday. (Patient taking differently: Take 20 mg by mouth 3 (three) times a week. ) 30 tablet 3  . glucose blood test strip Use as directed twice daily to check blood sugar.  Diagnosis code E11.9 100 each 6  . ipratropium-albuterol (DUONEB) 0.5-2.5 (3) MG/3ML SOLN Take 3 mLs by nebulization every 4 (four) hours as needed. 360 mL 1  . lactulose (CHRONULAC) 10 GM/15ML solution Take 45 mLs (30 g total) by mouth 2 (two) times daily as needed for mild constipation. 1892 mL 1  . omeprazole (PRILOSEC) 20 MG capsule Take 1 capsule (20 mg total) by mouth daily. 30 capsule 3   No  current facility-administered medications on file prior to visit.    Allergies  Allergen Reactions  . Citalopram     Confusion, irregular heart beat.  . Erythromycin     Stomach cramps  . Versed [Midazolam] Other (See Comments)    Patient stayed confusion stayed 4+days     Review of Systems  Review of Systems  Constitutional: Positive for malaise/fatigue. Negative for fever.  HENT: Negative for congestion.   Eyes: Negative for discharge.  Respiratory: Negative for shortness of breath.   Cardiovascular: Negative for chest pain, palpitations and leg swelling.  Gastrointestinal: Negative for nausea, abdominal pain and diarrhea.  Genitourinary: Positive for frequency. Negative for dysuria.  Musculoskeletal: Negative for falls.  Skin: Negative for rash.  Neurological: Negative for loss of consciousness and headaches.  Endo/Heme/Allergies: Negative for polydipsia.  Psychiatric/Behavioral: Negative for depression and suicidal ideas. The patient is not nervous/anxious and does not have insomnia.     Objective  BP 120/62 mmHg  Pulse 94  Temp(Src) 98.2 F (36.8 C) (Oral)  Ht 5\' 7"  (1.702 m)  Wt 146 lb 2 oz (66.282 kg)  BMI 22.88 kg/m2  SpO2 93%  Physical Exam  Physical Exam  Constitutional: She is oriented to person, place, and time and well-developed, well-nourished, and in no distress. No distress.  HENT:  Head: Normocephalic and atraumatic.  Eyes: Conjunctivae are normal.  Neck: Neck supple. No thyromegaly present.  Cardiovascular: Normal rate, regular rhythm and normal heart sounds.   No murmur heard. Pulmonary/Chest: Effort normal and breath sounds normal. She has no wheezes.  Abdominal: She exhibits no distension and no mass.  Musculoskeletal: She exhibits no edema.  Lymphadenopathy:    She has no cervical adenopathy.  Neurological: She is alert and oriented to person, place, and time.  Skin: Skin is warm and dry. No rash noted. She is not diaphoretic.    Psychiatric: Memory, affect and judgment normal.    Lab Results  Component Value Date   TSH 0.864 10/16/2014   Lab Results  Component Value Date   WBC 4.8 12/20/2014   HGB 13.5 12/20/2014   HCT 40.8 12/20/2014   MCV 91.8 12/20/2014   PLT 135.0* 12/20/2014   Lab Results  Component Value Date   CREATININE 0.67 12/20/2014   BUN 14 12/20/2014   NA 138 12/20/2014   K 4.1 12/20/2014   CL 102 12/20/2014   CO2 33* 12/20/2014   Lab Results  Component Value Date   ALT 24 12/20/2014   AST 32 12/20/2014   ALKPHOS 108 12/20/2014   BILITOT 1.2 12/20/2014   Lab Results  Component Value Date   CHOL 167  04/12/2014   Lab Results  Component Value Date   HDL 52.30 04/12/2014   Lab Results  Component Value Date   LDLCALC 98 04/12/2014   Lab Results  Component Value Date   TRIG 83.0 04/12/2014   Lab Results  Component Value Date   CHOLHDL 3 04/12/2014     Assessment & Plan  Esophageal reflux Avoid offending foods, start probiotics. Do not eat large meals in late evening and consider raising head of bed.    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 Stable, no increasing SOB   Diabetes mellitus type 2, controlled Numbers improving but some are still above 200 since stopping Metformin. Continue same for now   Increased ammonia level Improved on recheck, maintain appropriate diet, referred to nutritionist. Continue lactulose as needed   Tobacco abuse disorder Encouraged complete cessation. Discussed need to quit as relates to risk of numerous cancers, cardiac and pulmonary disease as well as neurologic complications. Counseled for greater than 3 minutes   Diarrhea Checked stool culture, O&P and CDiff all negative. Encouraged probiotics and fiber supplements. May cut back on the Lactulose which she has been using inconsistently

## 2014-12-30 NOTE — Assessment & Plan Note (Signed)
Avoid offending foods, start probiotics. Do not eat large meals in late evening and consider raising head of bed.  

## 2014-12-30 NOTE — Assessment & Plan Note (Signed)
Numbers improving but some are still above 200 since stopping Metformin. Continue same for now

## 2014-12-30 NOTE — Assessment & Plan Note (Signed)
Encouraged complete cessation. Discussed need to quit as relates to risk of numerous cancers, cardiac and pulmonary disease as well as neurologic complications. Counseled for greater than 3 minutes 

## 2014-12-30 NOTE — Assessment & Plan Note (Signed)
Stable, no increasing SOB

## 2014-12-30 NOTE — Assessment & Plan Note (Signed)
Checked stool culture, O&P and CDiff all negative. Encouraged probiotics and fiber supplements. May cut back on the Lactulose which she has been using inconsistently

## 2014-12-30 NOTE — Assessment & Plan Note (Signed)
Improved on recheck, maintain appropriate diet, referred to nutritionist. Continue lactulose as needed

## 2014-12-31 ENCOUNTER — Other Ambulatory Visit: Payer: Self-pay | Admitting: Family Medicine

## 2014-12-31 MED ORDER — CIPROFLOXACIN HCL 250 MG PO TABS: 250.0000 mg | ORAL_TABLET | Freq: Two times a day (BID) | ORAL | Status: DC

## 2015-01-01 ENCOUNTER — Telehealth: Payer: Self-pay | Admitting: Family Medicine

## 2015-01-01 ENCOUNTER — Other Ambulatory Visit: Payer: Self-pay | Admitting: Family Medicine

## 2015-01-01 DIAGNOSIS — R7989 Other specified abnormal findings of blood chemistry: Secondary | ICD-10-CM

## 2015-01-01 DIAGNOSIS — R197 Diarrhea, unspecified: Secondary | ICD-10-CM

## 2015-01-01 NOTE — Telephone Encounter (Signed)
Her last sugar check with [place] was better. What kinds of numbers is she seeing when she checks? Her ammonia is improving so I would rather not restart metformin just yet.

## 2015-01-01 NOTE — Telephone Encounter (Signed)
Relation to pt: self  Call back number: [phone]   Reason for call:   Pt requesting a referral to [facility] or any gastro connected to [place], pt states she will travel she is unhappy with [facility].   Pt would like to discuss diabetes medication. Pt will be available to talk around 2pm. Please advise

## 2015-01-01 NOTE — Telephone Encounter (Signed)
The patient states her GI MD at [facility] thinks she should not be off her diabetes medications.  Advise if to continue off or  Back on diabetes medication.  Please do refer her to a GI MD with [facility] as the patient does not care for the GI MD at [facility].

## 2015-01-03 NOTE — Telephone Encounter (Signed)
Patient sugars reported----- Am around 120 when waking When going to bed around 230.  Patient checks only twice a daily and those are averages for both mornings and evenings. Patient states it is a little higher  Both times now that she is not taking anything (average 20 pts higher in the am and 30 pts higher in the evening that when taking diabetes medication).    Also takes some before dinner in the evenings around 150's........  Patient states she was last on glimepiride not metformin. But d/c by PCP.

## 2015-01-03 NOTE — Telephone Encounter (Signed)
Ok to restart Glimepiride 1 mg tabs 1 tab po daily then recheck ammonia level in 2 weeks to make sure it does not go back up.

## 2015-01-03 NOTE — Telephone Encounter (Signed)
Called left msg to call back. 

## 2015-01-04 NOTE — Telephone Encounter (Signed)
Patient informed of PCP instructions,  Scheduled lab and put order in.  Also updated medication list.

## 2015-01-17 ENCOUNTER — Other Ambulatory Visit (INDEPENDENT_AMBULATORY_CARE_PROVIDER_SITE_OTHER): Payer: Medicare HMO

## 2015-01-17 DIAGNOSIS — R7989 Other specified abnormal findings of blood chemistry: Secondary | ICD-10-CM | POA: Diagnosis not present

## 2015-01-17 LAB — AMMONIA: Ammonia: 110 umol/L — ABNORMAL HIGH (ref 16–53)

## 2015-01-17 NOTE — Addendum Note (Signed)
Addended by: Peggyann Shoals on: 01/17/2015 10:35 AM   Modules accepted: Orders

## 2015-01-18 ENCOUNTER — Telehealth: Payer: Self-pay | Admitting: Family Medicine

## 2015-01-18 NOTE — Telephone Encounter (Signed)
The patient has scheduled an appt next Thursday 01/24/15 with PCP to discuss ammonia level.  She just realized the only thing that has changed/or she has done different since her last ammonia check was she was instructed to start back on her diabetes medication.  She wondered if she could possible stop the diabetes meds. Now and be off 5 days or so and recheck labs at her visit next week to see if it makes  A difference??  Otherwise she knows of nothing else she has done differently.

## 2015-01-18 NOTE — Telephone Encounter (Signed)
Called informed the patient of PCP instructions.  The patient did verbalize understanding of all instructions.

## 2015-01-18 NOTE — Telephone Encounter (Signed)
Yes hold her diabetic med. Minimize simple carbs and come in next week with sugar log so we can repeat labs and pick a new sugar medicine.

## 2015-01-24 ENCOUNTER — Encounter: Payer: Self-pay | Admitting: Family Medicine

## 2015-01-24 ENCOUNTER — Ambulatory Visit (INDEPENDENT_AMBULATORY_CARE_PROVIDER_SITE_OTHER): Payer: Medicare HMO | Admitting: Family Medicine

## 2015-01-24 VITALS — BP 122/82 | HR 83 | Temp 98.1°F | Ht 67.0 in | Wt 154.0 lb

## 2015-01-24 DIAGNOSIS — R197 Diarrhea, unspecified: Secondary | ICD-10-CM

## 2015-01-24 DIAGNOSIS — R7989 Other specified abnormal findings of blood chemistry: Secondary | ICD-10-CM

## 2015-01-24 DIAGNOSIS — F17210 Nicotine dependence, cigarettes, uncomplicated: Secondary | ICD-10-CM | POA: Diagnosis not present

## 2015-01-24 DIAGNOSIS — E119 Type 2 diabetes mellitus without complications: Secondary | ICD-10-CM

## 2015-01-24 DIAGNOSIS — N390 Urinary tract infection, site not specified: Secondary | ICD-10-CM

## 2015-01-24 DIAGNOSIS — R1011 Right upper quadrant pain: Secondary | ICD-10-CM

## 2015-01-24 DIAGNOSIS — K219 Gastro-esophageal reflux disease without esophagitis: Secondary | ICD-10-CM

## 2015-01-24 LAB — CBC
HCT: 38.2 % (ref 36.0–46.0)
Hemoglobin: 12.4 g/dL (ref 12.0–15.0)
MCHC: 32.5 g/dL (ref 30.0–36.0)
MCV: 91.5 fl (ref 78.0–100.0)
Platelets: 107 10*3/uL — ABNORMAL LOW (ref 150.0–400.0)
RBC: 4.18 Mil/uL (ref 3.87–5.11)
RDW: 15.6 % — ABNORMAL HIGH (ref 11.5–15.5)
WBC: 4.3 10*3/uL (ref 4.0–10.5)

## 2015-01-24 LAB — COMPREHENSIVE METABOLIC PANEL
ALT: 23 U/L (ref 0–35)
AST: 30 U/L (ref 0–37)
Albumin: 3.2 g/dL — ABNORMAL LOW (ref 3.5–5.2)
Alkaline Phosphatase: 109 U/L (ref 39–117)
BUN: 16 mg/dL (ref 6–23)
CO2: 33 mEq/L — ABNORMAL HIGH (ref 19–32)
Calcium: 8.9 mg/dL (ref 8.4–10.5)
Chloride: 103 mEq/L (ref 96–112)
Creatinine, Ser: 0.69 mg/dL (ref 0.40–1.20)
GFR: 88.94 mL/min (ref >60.00)
Glucose, Bld: 137 mg/dL — ABNORMAL HIGH (ref 70–99)
POTASSIUM: 3.9 meq/L (ref 3.5–5.1)
SODIUM: 139 meq/L (ref 135–145)
Total Bilirubin: 0.9 mg/dL (ref 0.2–1.2)
Total Protein: 5.9 g/dL — ABNORMAL LOW (ref 6.0–8.3)

## 2015-01-24 LAB — AMMONIA: Ammonia: 72 umol/L — ABNORMAL HIGH (ref 16–53)

## 2015-01-24 MED ORDER — SITAGLIPTIN PHOSPHATE 50 MG PO TABS: 50.0000 mg | ORAL_TABLET | Freq: Three times a day (TID) | ORAL | Status: DC

## 2015-01-24 MED ORDER — ALBUTEROL SULFATE (2.5 MG/3ML) 0.083% IN NEBU: INHALATION_SOLUTION | RESPIRATORY_TRACT | Status: DC

## 2015-01-24 NOTE — Assessment & Plan Note (Addendum)
Went back up again when she restarted the Glimepiride, d/c ed a week ago, will recheck the ammonia level today and try to start Januvia, sugars elevated in pm and OK in am at present. Ammonia was improved will need to stay off of Glimeperide. She is having trouble managing this process with having a gastroenterology in a different system. She would like to switch her care to [facility] have placed a referral request await decision

## 2015-01-24 NOTE — Assessment & Plan Note (Signed)
Ammonia went up when we restarted the Glimepiride. Will d/c and switch to Januvia 25 mg daily.

## 2015-01-24 NOTE — Progress Notes (Signed)
Pre visit review using our clinic review tool, if applicable. No additional management support is needed unless otherwise documented below in the visit note. 

## 2015-01-24 NOTE — Assessment & Plan Note (Signed)
1 large loose stool each day but numerous small "squirts" each day. Will need to continue the lactulose due to the ammonia level

## 2015-01-24 NOTE — Assessment & Plan Note (Signed)
Encouraged complete cessation. Discussed need to quit as relates to risk of numerous cancers, cardiac and pulmonary disease as well as neurologic complications. Counseled for greater than 3 minutes. Has had a good response to the gum in the past, is encouraged to try waiting 10 minutes when she has a craving, after that she can try a piece of gum instead of a cigarette if she is unable to make. Can consider patches if this does not work

## 2015-01-24 NOTE — Progress Notes (Signed)
Ashley Savage  [id number] [birth year] 01/24/2015      Progress Note-Follow Up  Subjective  Chief Complaint  Chief Complaint  Patient presents with  . Follow-up    ammonia level  . Medication Problem    change diabetes medication    HPI  Patient is a 74 y.o. female in today for routine medical care. Patient is in today for follow-up on numerous conditions. She continues to struggle with elevated ammonia levels but has had no recent changes in mental status. Upon restarting glimepiride her ammonia increased again. She is noting frequent loose stool lately with the use of her lactulose. 3 loose stool is what they're shooting for an she frequently is able to manage that. She notes she feels weak and has soreness around her rectal area as a result. Also notes some intermittent right upper quadrant pain although is not having any today no other acute complaints. Blood sugars have been ranging from 95 to 208. Denies CP/palp/HA/congestion/fevers/GI or GU c/o. Taking meds as prescribed  Past Medical History  Diagnosis Date  . Emphysema   . Diabetes mellitus type 2  . Hyperlipidemia   . Cancer breast ca  right  . Anxiety   . Panic attacks   . Depression   . Arthritis of both knees 10/01/2013  . Benign paroxysmal positional vertigo 10/01/2013  . Neck pain 10/01/2013  . Esophageal reflux 10/01/2013  . Pedal edema 12/10/2013  . Overactive bladder 12/10/2013  . Tobacco abuse disorder 02/01/2014  . COPD (chronic obstructive pulmonary disease) 10/01/2013  . Serum ammonia increased 11/10/2014  . Hyperlipidemia, mixed   . Increased ammonia level 11/25/2014  . Diarrhea 12/30/2014    Past Surgical History  Procedure Laterality Date  . Gallbladder surgery  1992  . Breast surgery  2009 right  . Appendectomy  2007  . Knee surgery    . Mandible fracture surgery    . Pilonidal cyst excision    . Tonsillectomy    . Cataract extraction      Family History  Problem Relation Age of Onset  . Heart failure  Father   . COPD Father   . Arthritis Father 24  . Pneumonia Sister   . Breast cancer    . Stroke Mother   . Arthritis Mother 8  . Hyperlipidemia Mother   . Hypertension Mother   . Diabetes Mother   . Breast cancer Maternal Aunt   . Alcohol abuse Maternal Uncle     History   Social History  . Marital Status: Single    Spouse Name: N/A  . Number of Children: 0  . Years of Education: N/A   Occupational History  . retire    Social History Main Topics  . Smoking status: Current Some Day Smoker -- 0.05 packs/day    Types: Cigarettes    Start date: 07/27/1964  . Smokeless tobacco: Never Used     Comment: No smoking x 2 wks on 10/29/14  . Alcohol Use: No  . Drug Use: No  . Sexual Activity: Yes    Birth Control/ Protection: Post-menopausal   Other Topics Concern  . Not on file   Social History Narrative    Current Outpatient Prescriptions on File Prior to Visit  Medication Sig Dispense Refill  . albuterol (PROVENTIL HFA;VENTOLIN HFA) 108 (90 BASE) MCG/ACT inhaler Inhale 2 puffs into the lungs every 6 (six) hours as needed for wheezing or shortness of breath. Only dispense Ventolin 1 Inhaler 3  . aspirin EC  81 MG tablet Take 1 tablet (81 mg total) by mouth daily. 90 tablet 3  . Cholecalciferol (VITAMIN D3) 2000 UNITS TABS Take 1 capsule by mouth daily.     . ciprofloxacin (CIPRO) 250 MG tablet Take 1 tablet (250 mg total) by mouth 2 (two) times daily. 6 tablet 0  . diazepam (VALIUM) 5 MG tablet Take 0.5 tablets (2.5 mg total) by mouth daily as needed for anxiety or muscle spasms. Takes 1/2-1 tablets as needed for anxiety 30 tablet 3  . Fluticasone-Salmeterol (ADVAIR) 250-50 MCG/DOSE AEPB Inhale 1 puff into the lungs 2 (two) times daily.    . furosemide (LASIX) 20 MG tablet Take 1 tablet (20 mg total) by mouth every Monday, Wednesday, and Friday. (Patient taking differently: Take 20 mg by mouth 3 (three) times a week. ) 30 tablet 3  . glimepiride (AMARYL) 1 MG tablet Take 1 mg  by mouth daily.    Marland Kitchen glucose blood test strip Use as directed twice daily to check blood sugar.  Diagnosis code E11.9 100 each 6  . ipratropium-albuterol (DUONEB) 0.5-2.5 (3) MG/3ML SOLN Take 3 mLs by nebulization every 4 (four) hours as needed. 360 mL 1  . lactulose (CHRONULAC) 10 GM/15ML solution Take 45 mLs (30 g total) by mouth 2 (two) times daily as needed for mild constipation. 1892 mL 1  . omeprazole (PRILOSEC) 20 MG capsule Take 1 capsule (20 mg total) by mouth daily. 30 capsule 3   No current facility-administered medications on file prior to visit.    Allergies  Allergen Reactions  . Citalopram     Confusion, irregular heart beat.  . Erythromycin     Stomach cramps  . Versed [Midazolam] Other (See Comments)    Patient stayed confusion stayed 4+days     Review of Systems  Review of Systems  Constitutional: Positive for malaise/fatigue. Negative for fever.  HENT: Negative for congestion.   Eyes: Negative for discharge.  Respiratory: Positive for shortness of breath.   Cardiovascular: Negative for chest pain, palpitations and leg swelling.  Gastrointestinal: Positive for abdominal pain and diarrhea. Negative for nausea.  Genitourinary: Negative for dysuria, urgency, frequency, hematuria and flank pain.  Musculoskeletal: Positive for myalgias. Negative for falls.  Skin: Negative for rash.  Neurological: Positive for weakness. Negative for loss of consciousness and headaches.  Endo/Heme/Allergies: Negative for polydipsia.  Psychiatric/Behavioral: Negative for depression and suicidal ideas. The patient is not nervous/anxious and does not have insomnia.     Objective  BP 122/82 mmHg  Pulse 83  Temp(Src) 98.1 F (36.7 C) (Oral)  Ht 5\' 7"  (1.702 m)  Wt 154 lb (69.854 kg)  BMI 24.11 kg/m2  SpO2 93%  Physical Exam  Physical Exam  Constitutional: She is oriented to person, place, and time and well-developed, well-nourished, and in no distress. No distress.  HENT:    Head: Normocephalic and atraumatic.  Eyes: Conjunctivae are normal.  Neck: Neck supple. No thyromegaly present.  Cardiovascular: Normal rate, regular rhythm and normal heart sounds.   No murmur heard. Pulmonary/Chest: Effort normal and breath sounds normal. She has no wheezes.  Abdominal: Soft. Bowel sounds are normal. She exhibits no distension and no mass.  Musculoskeletal: She exhibits no edema.  Lymphadenopathy:    She has no cervical adenopathy.  Neurological: She is alert and oriented to person, place, and time.  Skin: Skin is warm and dry. No rash noted. She is not diaphoretic.  Psychiatric: Memory, affect and judgment normal.    Lab Results  Component  Value Date   TSH 0.864 10/16/2014   Lab Results  Component Value Date   WBC 4.8 12/20/2014   HGB 13.5 12/20/2014   HCT 40.8 12/20/2014   MCV 91.8 12/20/2014   PLT 135.0* 12/20/2014   Lab Results  Component Value Date   CREATININE 0.67 12/20/2014   BUN 14 12/20/2014   NA 138 12/20/2014   K 4.1 12/20/2014   CL 102 12/20/2014   CO2 33* 12/20/2014   Lab Results  Component Value Date   ALT 24 12/20/2014   AST 32 12/20/2014   ALKPHOS 108 12/20/2014   BILITOT 1.2 12/20/2014   Lab Results  Component Value Date   CHOL 167 04/12/2014   Lab Results  Component Value Date   HDL 52.30 04/12/2014   Lab Results  Component Value Date   LDLCALC 98 04/12/2014   Lab Results  Component Value Date   TRIG 83.0 04/12/2014   Lab Results  Component Value Date   CHOLHDL 3 04/12/2014     Assessment & Plan  Tobacco abuse disorder Encouraged complete cessation. Discussed need to quit as relates to risk of numerous cancers, cardiac and pulmonary disease as well as neurologic complications. Counseled for greater than 3 minutes. Has had a good response to the gum in the past, is encouraged to try waiting 10 minutes when she has a craving, after that she can try a piece of gum instead of a cigarette if she is unable to make.  Can consider patches if this does not work  Diarrhea 1 large loose stool each day but numerous small "squirts" each day. Will need to continue the lactulose due to the ammonia level  Diabetes mellitus type 2, controlled Ammonia went up when we restarted the Glimepiride. Will d/c and switch to Januvia 25 mg daily.   Increased ammonia level Went back up again when she restarted the Glimepiride, d/c ed a week ago, will recheck the ammonia level today and try to start Januvia, sugars elevated in pm and OK in am at present. Ammonia was improved will need to stay off of Glimeperide. She is having trouble managing this process with having a gastroenterology in a different system. She would like to switch her care to [facility] have placed a referral request await decision  Esophageal reflux Avoid offending foods, take probiotics. Do not eat large meals in late evening and consider raising head of bed.

## 2015-01-25 ENCOUNTER — Other Ambulatory Visit: Payer: Self-pay | Admitting: Family Medicine

## 2015-01-25 DIAGNOSIS — R7989 Other specified abnormal findings of blood chemistry: Secondary | ICD-10-CM

## 2015-01-29 LAB — URINALYSIS, ROUTINE W REFLEX MICROSCOPIC
Hgb urine dipstick: NEGATIVE
Leukocytes, UA: NEGATIVE
Nitrite: NEGATIVE
RBC / HPF: NONE SEEN (ref 0–?)
UROBILINOGEN UA: 1 (ref 0.0–1.0)
Urine Glucose: 100 — AB
pH: 6 (ref 5.0–8.0)

## 2015-01-29 LAB — CNS IGG SYNTHESIS RATE, CSF+BLOOD

## 2015-01-30 LAB — URINE CULTURE

## 2015-01-30 LAB — IGG: IgG (Immunoglobin G), Serum: 835 mg/dL (ref 690–1700)

## 2015-02-03 ENCOUNTER — Encounter: Payer: Self-pay | Admitting: Family Medicine

## 2015-02-03 MED ORDER — SITAGLIPTIN PHOSPHATE 50 MG PO TABS: 50.0000 mg | ORAL_TABLET | Freq: Every day | ORAL | Status: DC

## 2015-02-03 NOTE — Assessment & Plan Note (Signed)
Avoid offending foods, take probiotics. Do not eat large meals in late evening and consider raising head of bed.  

## 2015-02-04 ENCOUNTER — Telehealth: Payer: Self-pay

## 2015-02-04 NOTE — Telephone Encounter (Signed)
Appointment scheduled for Friday, February 08, 2015.

## 2015-02-04 NOTE — Telephone Encounter (Signed)
Pt notified. Also has an appointment for CT scan tomorrow at 11a.m.

## 2015-02-05 ENCOUNTER — Ambulatory Visit (HOSPITAL_BASED_OUTPATIENT_CLINIC_OR_DEPARTMENT_OTHER)
Admission: RE | Admit: 2015-02-05 | Discharge: 2015-02-05 | Disposition: A | Discharge status: Home / Self Care | Payer: Medicare HMO | Source: Ambulatory Visit | Attending: Family Medicine | Admitting: Family Medicine

## 2015-02-05 ENCOUNTER — Encounter (HOSPITAL_BASED_OUTPATIENT_CLINIC_OR_DEPARTMENT_OTHER): Payer: Self-pay

## 2015-02-05 DIAGNOSIS — M5137 Other intervertebral disc degeneration, lumbosacral region: Secondary | ICD-10-CM | POA: Diagnosis not present

## 2015-02-05 DIAGNOSIS — R7989 Other specified abnormal findings of blood chemistry: Secondary | ICD-10-CM

## 2015-02-05 DIAGNOSIS — R1011 Right upper quadrant pain: Secondary | ICD-10-CM | POA: Diagnosis present

## 2015-02-05 DIAGNOSIS — R197 Diarrhea, unspecified: Secondary | ICD-10-CM

## 2015-02-05 DIAGNOSIS — I714 Abdominal aortic aneurysm, without rupture: Secondary | ICD-10-CM | POA: Insufficient documentation

## 2015-02-05 MED ORDER — IOHEXOL 300 MG/ML  SOLN
100.0000 mL | Freq: Once | INTRAMUSCULAR | Status: AC | PRN
  Administered 2015-02-05: 100 mL via INTRAVENOUS

## 2015-02-06 ENCOUNTER — Telehealth: Payer: Self-pay

## 2015-02-06 MED ORDER — INSULIN GLARGINE 100 UNITS/ML SOLOSTAR PEN: 10.0000 [IU] | PEN_INJECTOR | Freq: Every day | SUBCUTANEOUS | Status: DC

## 2015-02-06 NOTE — Telephone Encounter (Signed)
-----   Message from Mosie Lukes, MD sent at 02/05/2015 10:56 PM EDT ----- No obvious cause of pain and hi level of ammonia, shows fatty liver, an aortic aneurysm which is not large enough to require treatment, Degenerative Disc disease and a fibroid uterus

## 2015-02-06 NOTE — Telephone Encounter (Signed)
Pt states blood sugar changes (600) when taking lactas and Januvia togethe . Please assist.

## 2015-02-06 NOTE — Telephone Encounter (Signed)
Needs to stop Januvia and start Lantus flexpen 10 units  SQ daily, disp 3 pens with 1 rf schedule nurse visit to learn how to give the shot.

## 2015-02-06 NOTE — Telephone Encounter (Signed)
Pt states she has an appointment with [place] on Friday. Informed to decrease Lactulose to once daily 1 hour after taking Januvia and we will show her how to give Lantus Flexpen then.

## 2015-02-07 NOTE — Telephone Encounter (Signed)
Pt states that [facility] said the insulin isn't covered for this brand but another brand is. She doesn't know the name and asked if we can contact pharmacy. Please call [facility] on [address] in [place].

## 2015-02-07 NOTE — Telephone Encounter (Signed)
That is great, switch to Levemir with same sig.

## 2015-02-07 NOTE — Telephone Encounter (Signed)
Faxed hardcopy for insulin to [facility]

## 2015-02-07 NOTE — Telephone Encounter (Signed)
Called [facility], but on hold too long.  Will call back

## 2015-02-07 NOTE — Telephone Encounter (Signed)
Called [facility] and spoke to the pharmacist.  He said ok for insurance to change to levemir.

## 2015-02-08 ENCOUNTER — Other Ambulatory Visit (INDEPENDENT_AMBULATORY_CARE_PROVIDER_SITE_OTHER): Payer: Medicare HMO

## 2015-02-08 ENCOUNTER — Ambulatory Visit (INDEPENDENT_AMBULATORY_CARE_PROVIDER_SITE_OTHER): Payer: Medicare HMO

## 2015-02-08 VITALS — BP 112/50 | HR 76 | Ht 67.0 in | Wt 150.2 lb

## 2015-02-08 DIAGNOSIS — Z0000 Encounter for general adult medical examination without abnormal findings: Secondary | ICD-10-CM

## 2015-02-08 DIAGNOSIS — R7989 Other specified abnormal findings of blood chemistry: Secondary | ICD-10-CM | POA: Diagnosis not present

## 2015-02-08 LAB — AMMONIA: Ammonia: 133 umol/L — ABNORMAL HIGH (ref 16–53)

## 2015-02-08 MED ORDER — INSULIN DETEMIR 100 UNIT/ML ~~LOC~~ SOLN: 10.0000 [IU] | Freq: Every day | SUBCUTANEOUS | Status: DC

## 2015-02-08 NOTE — Patient Instructions (Addendum)
Work on becoming more active.  Work on quitting smoking.  Continue with the [place] with [employer]    Fall Prevention and Home Safety Falls cause injuries and can affect all age groups. It is possible to prevent falls.  HOW TO PREVENT FALLS  Wear shoes with rubber soles that do not have an opening for your toes.  Keep the inside and outside of your house well lit.  Use night lights throughout your home.  Remove clutter from floors.  Clean up floor spills.  Remove throw rugs or fasten them to the floor with carpet tape.  Do not place electrical cords across pathways.  Put grab bars by your tub, shower, and toilet. Do not use towel bars as grab bars.  Put handrails on both sides of the stairway. Fix loose handrails.  Do not climb on stools or stepladders, if possible.  Do not wax your floors.  Repair uneven or unsafe sidewalks, walkways, or stairs.  Keep items you use a lot within reach.  Be aware of pets.  Keep emergency numbers next to the telephone.  Put smoke detectors in your home and near bedrooms. Ask your doctor what other things you can do to prevent falls. Document Released: 05/09/2009 Document Revised: 01/12/2012 Document Reviewed: 10/13/2011 [facility] Patient Information 2015 [place], [place]. This information is not intended to replace advice given to you by your health care provider. Make sure you discuss any questions you have with your health care provider.  Health Maintenance Adopting a healthy lifestyle and getting preventive care can go a long way to promote health and wellness. Talk with your health care provider about what schedule of regular examinations is right for you. This is a good chance for you to check in with your provider about disease prevention and staying healthy. In between checkups, there are plenty of things you can do on your own. Experts have done a lot of research about which lifestyle changes and preventive  measures are most likely to keep you healthy. Ask your health care provider for more information. WEIGHT AND DIET  Eat a healthy diet  Be sure to include plenty of vegetables, fruits, low-fat dairy products, and lean protein.  Do not eat a lot of foods high in solid fats, added sugars, or salt.  Get regular exercise. This is one of the most important things you can do for your health.  Most adults should exercise for at least 150 minutes each week. The exercise should increase your heart rate and make you sweat (moderate-intensity exercise).  Most adults should also do strengthening exercises at least twice a week. This is in addition to the moderate-intensity exercise.  Maintain a healthy weight  Body mass index (BMI) is a measurement that can be used to identify possible weight problems. It estimates body fat based on height and weight. Your health care provider can help determine your BMI and help you achieve or maintain a healthy weight.  For females 49 years of age and older:   A BMI below 18.5 is considered underweight.  A BMI of 18.5 to 24.9 is normal.  A BMI of 25 to 29.9 is considered overweight.  A BMI of 30 and above is considered obese.  Watch levels of cholesterol and blood lipids  You should start having your blood tested for lipids and cholesterol at 72 years of age, then have this test every 5 years.  You may need to have your cholesterol levels checked more often if:  Your lipid or cholesterol levels are high.  You are older than 72 years of age.  You are at high risk for heart disease.  CANCER SCREENING   Lung Cancer  Lung cancer screening is recommended for adults 92-26 years old who are at high risk for lung cancer because of a history of smoking.  A yearly low-dose CT scan of the lungs is recommended for people who:  Currently smoke.  Have quit within the past 15 years.  Have at least a 30-pack-year history of smoking. A pack year is smoking  an average of one pack of cigarettes a day for 1 year.  Yearly screening should continue until it has been 15 years since you quit.  Yearly screening should stop if you develop a health problem that would prevent you from having lung cancer treatment.  Breast Cancer  Practice breast self-awareness. This means understanding how your breasts normally appear and feel.  It also means doing regular breast self-exams. Let your health care provider know about any changes, no matter how small.  If you are in your 20s or 30s, you should have a clinical breast exam (CBE) by a health care provider every 1-3 years as part of a regular health exam.  If you are 62 or older, have a CBE every year. Also consider having a breast X-ray (mammogram) every year.  If you have a family history of breast cancer, talk to your health care provider about genetic screening.  If you are at high risk for breast cancer, talk to your health care provider about having an MRI and a mammogram every year.  Breast cancer gene (BRCA) assessment is recommended for women who have family members with BRCA-related cancers. BRCA-related cancers include:  Breast.  Ovarian.  Tubal.  Peritoneal cancers.  Results of the assessment will determine the need for genetic counseling and BRCA1 and BRCA2 testing. Cervical Cancer Routine pelvic examinations to screen for cervical cancer are no longer recommended for nonpregnant women who are considered low risk for cancer of the pelvic organs (ovaries, uterus, and vagina) and who do not have symptoms. A pelvic examination may be necessary if you have symptoms including those associated with pelvic infections. Ask your health care provider if a screening pelvic exam is right for you.   The Pap test is the screening test for cervical cancer for women who are considered at risk.  If you had a hysterectomy for a problem that was not cancer or a condition that could lead to cancer, then you  no longer need Pap tests.  If you are older than 65 years, and you have had normal Pap tests for the past 10 years, you no longer need to have Pap tests.  If you have had past treatment for cervical cancer or a condition that could lead to cancer, you need Pap tests and screening for cancer for at least 20 years after your treatment.  If you no longer get a Pap test, assess your risk factors if they change (such as having a new sexual partner). This can affect whether you should start being screened again.  Some women have medical problems that increase their chance of getting cervical cancer. If this is the case for you, your health care provider may recommend more frequent screening and Pap tests.  The human papillomavirus (HPV) test is another test that may be used for cervical cancer screening. The HPV test looks for the virus that can cause cell changes in the cervix.  The cells collected during the Pap test can be tested for HPV.  The HPV test can be used to screen women 38 years of age and older. Getting tested for HPV can extend the interval between normal Pap tests from three to five years.  An HPV test also should be used to screen women of any age who have unclear Pap test results.  After 72 years of age, women should have HPV testing as often as Pap tests.  Colorectal Cancer  This type of cancer can be detected and often prevented.  Routine colorectal cancer screening usually begins at 72 years of age and continues through 72 years of age.  Your health care provider may recommend screening at an earlier age if you have risk factors for colon cancer.  Your health care provider may also recommend using home test kits to check for hidden blood in the stool.  A small camera at the end of a tube can be used to examine your colon directly (sigmoidoscopy or colonoscopy). This is done to check for the earliest forms of colorectal cancer.  Routine screening usually begins at age  22.  Direct examination of the colon should be repeated every 5-10 years through 72 years of age. However, you may need to be screened more often if early forms of precancerous polyps or small growths are found. Skin Cancer  Check your skin from head to toe regularly.  Tell your health care provider about any new moles or changes in moles, especially if there is a change in a mole's shape or color.  Also tell your health care provider if you have a mole that is larger than the size of a pencil eraser.  Always use sunscreen. Apply sunscreen liberally and repeatedly throughout the day.  Protect yourself by wearing long sleeves, pants, a wide-brimmed hat, and sunglasses whenever you are outside. HEART DISEASE, DIABETES, AND HIGH BLOOD PRESSURE   Have your blood pressure checked at least every 1-2 years. High blood pressure causes heart disease and increases the risk of stroke.  If you are between 72 years and 64 years old, ask your health care provider if you should take aspirin to prevent strokes.  Have regular diabetes screenings. This involves taking a blood sample to check your fasting blood sugar level.  If you are at a normal weight and have a low risk for diabetes, have this test once every three years after 72 years of age.  If you are overweight and have a high risk for diabetes, consider being tested at a younger age or more often. PREVENTING INFECTION  Hepatitis B  If you have a higher risk for hepatitis B, you should be screened for this virus. You are considered at high risk for hepatitis B if:  You were born in a country where hepatitis B is common. Ask your health care provider which countries are considered high risk.  Your parents were born in a high-risk country, and you have not been immunized against hepatitis B (hepatitis B vaccine).  You have HIV or AIDS.  You use needles to inject street drugs.  You live with someone who has hepatitis B.  You have had sex  with someone who has hepatitis B.  You get hemodialysis treatment.  You take certain medicines for conditions, including cancer, organ transplantation, and autoimmune conditions. Hepatitis C  Blood testing is recommended for:  Everyone born from [birth year] through 1963.  Anyone with known risk factors for hepatitis C. Sexually transmitted infections (  STIs)  You should be screened for sexually transmitted infections (STIs) including gonorrhea and chlamydia if:  You are sexually active and are younger than 72 years of age.  You are older than 72 years of age and your health care provider tells you that you are at risk for this type of infection.  Your sexual activity has changed since you were last screened and you are at an increased risk for chlamydia or gonorrhea. Ask your health care provider if you are at risk.  If you do not have HIV, but are at risk, it may be recommended that you take a prescription medicine daily to prevent HIV infection. This is called pre-exposure prophylaxis (PrEP). You are considered at risk if:  You are sexually active and do not regularly use condoms or know the HIV status of your partner(s).  You take drugs by injection.  You are sexually active with a partner who has HIV. Talk with your health care provider about whether you are at high risk of being infected with HIV. If you choose to begin PrEP, you should first be tested for HIV. You should then be tested every 3 months for as long as you are taking PrEP.  PREGNANCY   If you are premenopausal and you may become pregnant, ask your health care provider about preconception counseling.  If you may become pregnant, take 400 to 800 micrograms (mcg) of folic acid every day.  If you want to prevent pregnancy, talk to your health care provider about birth control (contraception). OSTEOPOROSIS AND MENOPAUSE   Osteoporosis is a disease in which the bones lose minerals and strength with aging. This can result  in serious bone fractures. Your risk for osteoporosis can be identified using a bone density scan.  If you are 76 years of age or older, or if you are at risk for osteoporosis and fractures, ask your health care provider if you should be screened.  Ask your health care provider whether you should take a calcium or vitamin D supplement to lower your risk for osteoporosis.  Menopause may have certain physical symptoms and risks.  Hormone replacement therapy may reduce some of these symptoms and risks. Talk to your health care provider about whether hormone replacement therapy is right for you.  HOME CARE INSTRUCTIONS   Schedule regular health, dental, and eye exams.  Stay current with your immunizations.   Do not use any tobacco products including cigarettes, chewing tobacco, or electronic cigarettes.  If you are pregnant, do not drink alcohol.  If you are breastfeeding, limit how much and how often you drink alcohol.  Limit alcohol intake to no more than 1 drink per day for nonpregnant women. One drink equals 12 ounces of beer, 5 ounces of wine, or 1 ounces of hard liquor.  Do not use street drugs.  Do not share needles.  Ask your health care provider for help if you need support or information about quitting drugs.  Tell your health care provider if you often feel depressed.  Tell your health care provider if you have ever been abused or do not feel safe at home. Document Released: 01/26/2011 Document Revised: 11/27/2013 Document Reviewed: 06/14/2013 [facility] Patient Information 2015 [place], [place]. This information is not intended to replace advice given to you by your health care provider. Make sure you discuss any questions you have with your health care provider.  Smoking Cessation Quitting smoking is important to your health and has many advantages. However, it is not always  easy to quit since nicotine is a very addictive drug. Oftentimes, people try 3 times or more  before being able to quit. This document explains the best ways for you to prepare to quit smoking. Quitting takes hard work and a lot of effort, but you can do it. ADVANTAGES OF QUITTING SMOKING  You will live longer, feel better, and live better.  Your body will feel the impact of quitting smoking almost immediately.  Within 20 minutes, blood pressure decreases. Your pulse returns to its normal level.  After 8 hours, carbon monoxide levels in the blood return to normal. Your oxygen level increases.  After 24 hours, the chance of having a heart attack starts to decrease. Your breath, hair, and body stop smelling like smoke.  After 48 hours, damaged nerve endings begin to recover. Your sense of taste and smell improve.  After 72 hours, the body is virtually free of nicotine. Your bronchial tubes relax and breathing becomes easier.  After 2 to 12 weeks, lungs can hold more air. Exercise becomes easier and circulation improves.  The risk of having a heart attack, stroke, cancer, or lung disease is greatly reduced.  After 1 year, the risk of coronary heart disease is cut in half.  After 5 years, the risk of stroke falls to the same as a nonsmoker.  After 10 years, the risk of lung cancer is cut in half and the risk of other cancers decreases significantly.  After 15 years, the risk of coronary heart disease drops, usually to the level of a nonsmoker.  If you are pregnant, quitting smoking will improve your chances of having a healthy baby.  The people you live with, especially any children, will be healthier.  You will have extra money to spend on things other than cigarettes. QUESTIONS TO THINK ABOUT BEFORE ATTEMPTING TO QUIT You may want to talk about your answers with your health care provider.  Why do you want to quit?  If you tried to quit in the past, what helped and what did not?  What will be the most difficult situations for you after you quit? How will you plan to  handle them?  Who can help you through the tough times? Your family? Friends? A health care provider?  What pleasures do you get from smoking? What ways can you still get pleasure if you quit? Here are some questions to ask your health care provider:  How can you help me to be successful at quitting?  What medicine do you think would be best for me and how should I take it?  What should I do if I need more help?  What is smoking withdrawal like? How can I get information on withdrawal? GET READY  Set a quit date.  Change your environment by getting rid of all cigarettes, ashtrays, matches, and lighters in your home, car, or work. Do not let people smoke in your home.  Review your past attempts to quit. Think about what worked and what did not. GET SUPPORT AND ENCOURAGEMENT You have a better chance of being successful if you have help. You can get support in many ways.  Tell your family, friends, and coworkers that you are going to quit and need their support. Ask them not to smoke around you.  Get individual, group, or telephone counseling and support. Programs are available at [employer] and health centers. Call your local health department for information about programs in your area.  Spiritual beliefs and practices may  help some smokers quit.  Download a "quit meter" on your computer to keep track of quit statistics, such as how long you have gone without smoking, cigarettes not smoked, and money saved.  Get a self-help book about quitting smoking and staying off tobacco. [place] yourself from urges to smoke. Talk to someone, go for a walk, or occupy your time with a task.  Change your normal routine. Take a different route to work. Drink tea instead of coffee. Eat breakfast in a different place.  Reduce your stress. Take a hot bath, exercise, or read a book.  Plan something enjoyable to do every day. Reward yourself for not  smoking.  Explore interactive web-based programs that specialize in helping you quit. GET MEDICINE AND USE IT CORRECTLY Medicines can help you stop smoking and decrease the urge to smoke. Combining medicine with the above behavioral methods and support can greatly increase your chances of successfully quitting smoking.  Nicotine replacement therapy helps deliver nicotine to your body without the negative effects and risks of smoking. Nicotine replacement therapy includes nicotine gum, lozenges, inhalers, nasal sprays, and skin patches. Some may be available over-the-counter and others require a prescription.  Antidepressant medicine helps people abstain from smoking, but how this works is unknown. This medicine is available by prescription.  Nicotinic receptor partial agonist medicine simulates the effect of nicotine in your brain. This medicine is available by prescription. Ask your health care provider for advice about which medicines to use and how to use them based on your health history. Your health care provider will tell you what side effects to look out for if you choose to be on a medicine or therapy. Carefully read the information on the package. Do not use any other product containing nicotine while using a nicotine replacement product.  RELAPSE OR DIFFICULT SITUATIONS Most relapses occur within the first 3 months after quitting. Do not be discouraged if you start smoking again. Remember, most people try several times before finally quitting. You may have symptoms of withdrawal because your body is used to nicotine. You may crave cigarettes, be irritable, feel very hungry, cough often, get headaches, or have difficulty concentrating. The withdrawal symptoms are only temporary. They are strongest when you first quit, but they will go away within 10-14 days. To reduce the chances of relapse, try to:  Avoid drinking alcohol. Drinking lowers your chances of successfully quitting.  Reduce the  amount of caffeine you consume. Once you quit smoking, the amount of caffeine in your body increases and can give you symptoms, such as a rapid heartbeat, sweating, and anxiety.  Avoid smokers because they can make you want to smoke.  Do not let weight gain distract you. Many smokers will gain weight when they quit, usually less than 10 pounds. Eat a healthy diet and stay active. You can always lose the weight gained after you quit.  Find ways to improve your mood other than smoking. FOR MORE INFORMATION  www.smokefree.gov  Document Released: 07/07/2001 Document Revised: 11/27/2013 Document Reviewed: 10/22/2011 [facility] Patient Information 2015 [place], [place]. This information is not intended to replace advice given to you by your health care provider. Make sure you discuss any questions you have with your health care provider.  You Can Quit Smoking If you are ready to quit smoking or are thinking about it, congratulations! You have chosen to help yourself be healthier and live longer! There are lots of different ways to quit smoking. Nicotine  gum, nicotine patches, a nicotine inhaler, or nicotine nasal spray can help with physical craving. Hypnosis, support groups, and medicines help break the habit of smoking. TIPS TO GET OFF AND STAY OFF CIGARETTES  Learn to predict your moods. Do not let a bad situation be your excuse to have a cigarette. Some situations in your life might tempt you to have a cigarette.  Ask friends and co-workers not to smoke around you.  Make your home smoke-free.  Never have "just one" cigarette. It leads to wanting another and another. Remind yourself of your decision to quit.  On a card, make a list of your reasons for not smoking. Read it at least the same number of times a day as you have a cigarette. Tell yourself everyday, "I do not want to smoke. I choose not to smoke."  Ask someone at home or work to help you with your plan to quit smoking.  Have something  planned after you eat or have a cup of coffee. Take a walk or get other exercise to perk you up. This will help to keep you from overeating.  Try a relaxation exercise to calm you down and decrease your stress. Remember, you may be tense and nervous the first two weeks after you quit. This will pass.  Find new activities to keep your hands busy. Play with a pen, coin, or rubber band. Doodle or draw things on paper.  Brush your teeth right after eating. This will help cut down the craving for the taste of tobacco after meals. You can try mouthwash too.  Try gum, breath mints, or diet candy to keep something in your mouth. IF YOU SMOKE AND WANT TO QUIT:  Do not stock up on cigarettes. Never buy a carton. Wait until one pack is finished before you buy another.  Never carry cigarettes with you at work or at home.  Keep cigarettes as far away from you as possible. Leave them with someone else.  Never carry matches or a lighter with you.  Ask yourself, "Do I need this cigarette or is this just a reflex?"  Bet with someone that you can quit. Put cigarette money in a piggy bank every morning. If you smoke, you give up the money. If you do not smoke, by the end of the week, you keep the money.  Keep trying. It takes 21 days to change a habit!  Talk to your doctor about using medicines to help you quit. These include nicotine replacement gum, lozenges, or skin patches. Document Released: 05/09/2009 Document Revised: 10/05/2011 Document Reviewed: 05/09/2009 [facility] Patient Information 2015 [place], [place]. This information is not intended to replace advice given to you by your health care provider. Make sure you discuss any questions you have with your health care provider.

## 2015-02-08 NOTE — Telephone Encounter (Signed)
Updated medication list D/C lantus reason insurance will not pay.  Did add levemir as covered by insurance and sent in to her pharmacy.  Called the patient left a detailed message script changed and sent in new insulin covered by insurance.

## 2015-02-08 NOTE — Addendum Note (Signed)
Addended by: Sharon Seller B on: 02/08/2015 07:15 AM   Modules accepted: Orders, Medications

## 2015-02-08 NOTE — Addendum Note (Signed)
Addended by: Tasia Catchings on: 02/08/2015 11:57 AM   Modules accepted: Orders

## 2015-02-08 NOTE — Progress Notes (Signed)
Subjective:   Ashley Savage is a 74 y.o. female who presents for an Initial Medicare Annual Wellness Visit.  She describes her health as fair.  She would like to become more active.  States right knee pain and COPD limits her activity.   Review of Systems     Risk Factors:  COPD-on Advair and Albuterol inhaler and neb solution as needed  DM II- stopped Januvia, on Lantus Anxiety/depression- stabilized on medication Tobacco Abuse- smokes 1/2 pack per day; social smoker; uses nicotine gum   BMI: 23.52   Sleep patterns:  5 hours during the night, 3 hour naps during the day Depression: Denies symptoms of depression.  MMSE score:  29/30  Fall Risk: Fall prevention tips discussed ADLs (bathing, grooming/dressing, walking, toileting):  Independent IADLs (shopping, housekeeping, medication management, handling finances):  Independent  Urinary Incontinence:  Gets up 2-3 times at night  Home Safety/Smoke Alarms:  Feels safe at home.  Lives at home alone in an apartment.  Has someone from [employer] to call to check on her every day.   Some clutter in home. Has rails and bath bench in bathroom.   Firearm Safety:  2 guns kept hidden in bedroom drawer.  No ammo. Seat Belt Safety/Bike Helmet:  Always wears seat belt Eye Exam- 02/26/14 Dental- wears plates upper and lower Female:  Colonoscopy- 05/02/09    Mammo-07/16/14    Dexa Scan- 06/10/12     Objective:    Today's Vitals   02/08/15 1101  BP: 112/50  Pulse: 76  Height: 5\' 7"  (1.702 m)  Weight: 150 lb 3.2 oz (68.13 kg)  SpO2: 98%  PainSc: 6   PainLoc: Knee    Current Medications (verified) Outpatient Encounter Prescriptions as of 02/08/2015  Medication Sig  . albuterol (PROVENTIL HFA;VENTOLIN HFA) 108 (90 BASE) MCG/ACT inhaler Inhale 2 puffs into the lungs every 6 (six) hours as needed for wheezing or shortness of breath. Only dispense Ventolin  . albuterol (PROVENTIL) (2.5 MG/3ML) 0.083% nebulizer solution Inhale one  vial via nebulizer four times daily as needed.  Dx code 28  . Cholecalciferol (VITAMIN D3) 2000 UNITS TABS Take 1 capsule by mouth daily.   . diazepam (VALIUM) 5 MG tablet Take 0.5 tablets (2.5 mg total) by mouth daily as needed for anxiety or muscle spasms. Takes 1/2-1 tablets as needed for anxiety  . Fluticasone-Salmeterol (ADVAIR) 250-50 MCG/DOSE AEPB Inhale 1 puff into the lungs 2 (two) times daily.  . furosemide (LASIX) 20 MG tablet Take 1 tablet (20 mg total) by mouth every Monday, Wednesday, and Friday. (Patient taking differently: Take 20 mg by mouth daily. Except 2 days out of the week)  . glucose blood test strip Use as directed twice daily to check blood sugar.  Diagnosis code E11.9  . lactulose (CHRONULAC) 10 GM/15ML solution Take 45 mLs (30 g total) by mouth 2 (two) times daily as needed for mild constipation.  . naproxen sodium (ANAPROX) 220 MG tablet Take 220 mg by mouth 2 (two) times daily with a meal. PRN  . omeprazole (PRILOSEC) 20 MG capsule Take 1 capsule (20 mg total) by mouth daily.  Marland Kitchen aspirin EC 81 MG tablet Take 1 tablet (81 mg total) by mouth daily. (Patient not taking: Reported on 02/08/2015)  . insulin glargine (LANTUS) 100 UNIT/ML injection Inject 0.1 Units into the skin at bedtime.  Marland Kitchen ipratropium-albuterol (DUONEB) 0.5-2.5 (3) MG/3ML SOLN Take 3 mLs by nebulization every 4 (four) hours as needed. (Patient not taking: Reported  on 02/08/2015)  . sitaGLIPtin (JANUVIA) 50 MG tablet Take 1 tablet (50 mg total) by mouth daily. Please discontinue all previous prescriptions for this medicaiton (Patient not taking: Reported on 02/08/2015)  . [DISCONTINUED] ciprofloxacin (CIPRO) 250 MG tablet Take 1 tablet (250 mg total) by mouth 2 (two) times daily.  . [DISCONTINUED] insulin detemir (LEVEMIR) 100 UNIT/ML injection Inject 0.1 mLs (10 Units total) into the skin daily.   No facility-administered encounter medications on file as of 02/08/2015.    Allergies (verified) Citalopram;  Erythromycin; Glimepiride; and Versed   History: Past Medical History  Diagnosis Date  . Emphysema   . Diabetes mellitus type 2  . Hyperlipidemia   . Cancer breast ca  right  . Anxiety   . Panic attacks   . Depression   . Arthritis of both knees 10/01/2013  . Benign paroxysmal positional vertigo 10/01/2013  . Neck pain 10/01/2013  . Esophageal reflux 10/01/2013  . Pedal edema 12/10/2013  . Overactive bladder 12/10/2013  . Tobacco abuse disorder 02/01/2014  . COPD (chronic obstructive pulmonary disease) 10/01/2013  . Serum ammonia increased 11/10/2014  . Hyperlipidemia, mixed   . Increased ammonia level 11/25/2014  . Diarrhea 12/30/2014  . Neuropathy     feet   . Right knee pain    Past Surgical History  Procedure Laterality Date  . Gallbladder surgery  1992  . Breast surgery  2009 right  . Appendectomy  2007  . Knee surgery    . Mandible fracture surgery    . Pilonidal cyst excision    . Tonsillectomy    . Cataract extraction      x 2   Family History  Problem Relation Age of Onset  . Heart failure Father   . COPD Father   . Arthritis Father 45  . Pneumonia Sister   . Breast cancer    . Stroke Mother   . Arthritis Mother 3  . Hyperlipidemia Mother   . Hypertension Mother   . Diabetes Mother   . Breast cancer Maternal Aunt   . Alcohol abuse Maternal Uncle    Social History   Occupational History  . retire    Social History Main Topics  . Smoking status: Current Some Day Smoker -- 0.50 packs/day    Types: Cigarettes    Start date: 07/27/1964  . Smokeless tobacco: Never Used     Comment: No smoking x 2 wks on 10/29/14; Social smoker  . Alcohol Use: No  . Drug Use: No  . Sexual Activity: No    Tobacco Counseling Ready to quit: Not Answered Counseling given: Not Answered   Activities of Daily Living In your present state of health, do you have any difficulty performing the following activities: 02/08/2015 02/08/2015  Hearing? Y Y  Vision? - N  Difficulty  concentrating or making decisions? - N  Walking or climbing stairs? - -  Dressing or bathing? - N  Doing errands, shopping? - -    Immunizations and Health Maintenance Immunization History  Administered Date(s) Administered  . Influenza Split 04/09/2011, 03/27/2012, 03/27/2013  . Influenza-Unspecified 04/24/2014  . Pneumococcal Conjugate-13 02/01/2014  . Pneumococcal Polysaccharide-23 04/09/2011  . Td 07/27/2010  . Tdap 05/08/2014   Health Maintenance Due  Topic Date Due  . FOOT EXAM  04/06/1951  . HEMOGLOBIN A1C  10/11/2014  . URINE MICROALBUMIN  12/09/2014    Patient Care Team: Mosie Lukes, MD as PCP - General (Family Medicine)  Indicate any recent Medical Services you may  have received from other than [facility] providers in the past year (date may be approximate).     Assessment:   This is a routine wellness examination for [facility].  Hearing/Vision screen Hearing- able to hear conversational tones Vision- Improved since cataract surgery  Dietary issues and exercise activities discussed: Diet:  Mostly microwave dinners.  Drinks water.  Consumes dairy products. Very little fruits and veggies.  Participates in the [place] with [employer]    Physical Activity/Exercise: Mostly sedentary.  Will do pool exercises occasionally.  Does not live far from the [place].     Goals    . Increase physical activity      Depression Screen PHQ 2/9 Scores 02/08/2015 02/08/2015 12/26/2014 02/01/2014 09/22/2013  PHQ - 2 Score 1 0 0 2 0    Fall Risk Fall Risk  02/08/2015 02/08/2015 12/26/2014 09/26/2014 03/28/2014  Falls in the past year? - Yes No No No  Number falls in past yr: - 1 - - -  Injury with Fall? - No - - -  Risk for fall due to : Impaired balance/gait Impaired vision;Impaired mobility Impaired balance/gait - -  Risk for fall due to (comments): - - use cane - -  Follow up - Education provided - - -    Cognitive Function: MMSE - Mini Mental State Exam 08/15/2014    Orientation to time 5  Orientation to Place 5  Registration 3  Attention/ Calculation 5  Recall 2  Language- name 2 objects 2  Language- repeat 1  Language- follow 3 step command 3  Language- read & follow direction 1  Write a sentence 1  Copy design 1  Total score 29    Screening Tests Health Maintenance  Topic Date Due  . FOOT EXAM  04/06/1951  . HEMOGLOBIN A1C  10/11/2014  . URINE MICROALBUMIN  12/09/2014  . ZOSTAVAX  04/27/2015 (Originally 04/07/2003)  . INFLUENZA VACCINE  02/25/2015  . OPHTHALMOLOGY EXAM  02/27/2015  . MAMMOGRAM  07/16/2016  . COLONOSCOPY  05/03/2019  . TETANUS/TDAP  05/08/2024  . DEXA SCAN  Completed  . PNA vac Low Risk Adult  Completed      Plan:  Discussed smoking cessation and was to become more physically active.    During the course of the visit, Ashley Savage was educated and counseled about the following appropriate screening and preventive services:   Vaccines to include Pneumoccal, Influenza, Hepatitis B, Td, Zostavax, HCV  Electrocardiogram  Cardiovascular disease screening  Colorectal cancer screening  Bone density screening  Diabetes screening  Glaucoma screening  Mammography/PAP  Nutrition counseling  Smoking cessation counseling  Patient Instructions (the written plan) were given to the patient.    Rudene Anda, RN   02/08/2015

## 2015-02-11 ENCOUNTER — Telehealth: Payer: Self-pay | Admitting: Family Medicine

## 2015-02-11 DIAGNOSIS — R7989 Other specified abnormal findings of blood chemistry: Secondary | ICD-10-CM

## 2015-02-11 MED ORDER — RIFAXIMIN 550 MG PO TABS: 550.0000 mg | ORAL_TABLET | Freq: Two times a day (BID) | ORAL | Status: DC

## 2015-02-11 MED ORDER — INSULIN DETEMIR 100 UNIT/ML FLEXPEN: 10.0000 [IU] | PEN_INJECTOR | Freq: Every day | SUBCUTANEOUS | Status: DC

## 2015-02-11 NOTE — Telephone Encounter (Signed)
Updated medication list, added levemir flexpen and added new medication. Scheduled lab for Thursday and put order in for the lab.

## 2015-02-11 NOTE — Telephone Encounter (Signed)
OK to switch her to Levemir flexpen. Her ammonia went back up again. We should start a second med to try and bring that down. Start Xifaxan 550 mg tabs 1 tab po bid disp #60 with 1 rf and continue Lactulose at twice daily. Recheck Ammonia later this week

## 2015-02-11 NOTE — Telephone Encounter (Signed)
Pharmacy states patient picked up her Levemir and was a vial.  Is it ok to change to a levemir flex touch pen?

## 2015-02-11 NOTE — Telephone Encounter (Signed)
Caller name: Kabrea Relation to pt: self Call back number: [phone] Pharmacy: [facility]  Reason for call: Pt went to pick up Lantus and it was in a vial, it should have been a pen. Please correct.

## 2015-02-13 ENCOUNTER — Other Ambulatory Visit: Payer: Self-pay | Admitting: Family Medicine

## 2015-02-13 ENCOUNTER — Ambulatory Visit: Payer: Medicare HMO

## 2015-02-13 MED ORDER — PEN NEEDLES 31G X 6 MM MISC: Status: DC

## 2015-02-13 NOTE — Progress Notes (Unsigned)
Patient presented for teaching on use of new insulin pen.  Patient verbalized dosage, signs of hypoglycemia and hyperglycemia, and instructions.  Patient correctly demonstrated use of pen.

## 2015-02-14 ENCOUNTER — Other Ambulatory Visit: Payer: Medicare HMO

## 2015-02-14 DIAGNOSIS — R7989 Other specified abnormal findings of blood chemistry: Secondary | ICD-10-CM

## 2015-02-15 LAB — AMMONIA: AMMONIA: 102 umol/L — AB (ref 16–53)

## 2015-02-18 ENCOUNTER — Other Ambulatory Visit: Payer: Self-pay | Admitting: Family Medicine

## 2015-02-18 DIAGNOSIS — R7989 Other specified abnormal findings of blood chemistry: Secondary | ICD-10-CM

## 2015-03-04 ENCOUNTER — Other Ambulatory Visit: Payer: Self-pay | Admitting: Family Medicine

## 2015-03-04 DIAGNOSIS — IMO0002 Reserved for concepts with insufficient information to code with codable children: Secondary | ICD-10-CM

## 2015-03-04 DIAGNOSIS — E7220 Disorder of urea cycle metabolism, unspecified: Secondary | ICD-10-CM

## 2015-03-04 DIAGNOSIS — E1165 Type 2 diabetes mellitus with hyperglycemia: Secondary | ICD-10-CM

## 2015-03-04 DIAGNOSIS — R109 Unspecified abdominal pain: Secondary | ICD-10-CM

## 2015-03-04 DIAGNOSIS — G9340 Encephalopathy, unspecified: Secondary | ICD-10-CM

## 2015-03-04 MED ORDER — LACTULOSE 10 GM/15ML PO SOLN: 30.0000 g | Freq: Two times a day (BID) | ORAL | Status: DC | PRN

## 2015-03-04 NOTE — Telephone Encounter (Signed)
Sent in lactulose as prescription is written on medication list.  The patient stated she did pickup a refill this weekend and was only enough for 2 days.

## 2015-03-05 NOTE — Progress Notes (Signed)
I have reviewed her AWV and agree with the documentation

## 2015-03-05 NOTE — Addendum Note (Signed)
Addended by: Aron Baba C on: 03/05/2015 11:44 AM   Modules accepted: Level of Service

## 2015-03-05 NOTE — Addendum Note (Signed)
Addended by: Aron Baba C on: 03/05/2015 11:41 AM   Modules accepted: Level of Service

## 2015-03-05 NOTE — Addendum Note (Signed)
Addended by: Gala Lewandowsky B on: 03/05/2015 12:35 PM   Modules accepted: Level of Service

## 2015-03-13 ENCOUNTER — Other Ambulatory Visit: Payer: Self-pay | Admitting: Critical Care Medicine

## 2015-03-14 ENCOUNTER — Telehealth: Payer: Self-pay | Admitting: Critical Care Medicine

## 2015-03-14 NOTE — Telephone Encounter (Signed)
Spoke with pt. She needs a refill on Advair 250-50. According to our records this refill has already been sent in today by Oscar La, LPN. Pt is aware of this. Nothing further was needed.

## 2015-03-18 ENCOUNTER — Ambulatory Visit (INDEPENDENT_AMBULATORY_CARE_PROVIDER_SITE_OTHER): Payer: Medicare HMO | Admitting: Medical

## 2015-03-18 ENCOUNTER — Encounter: Payer: Self-pay | Admitting: Medical

## 2015-03-18 VITALS — BP 105/55 | HR 89 | Temp 98.0°F | Wt 147.2 lb

## 2015-03-18 DIAGNOSIS — R5383 Other fatigue: Secondary | ICD-10-CM

## 2015-03-18 DIAGNOSIS — N390 Urinary tract infection, site not specified: Secondary | ICD-10-CM | POA: Diagnosis not present

## 2015-03-18 DIAGNOSIS — R350 Frequency of micturition: Secondary | ICD-10-CM

## 2015-03-18 DIAGNOSIS — R7989 Other specified abnormal findings of blood chemistry: Secondary | ICD-10-CM

## 2015-03-18 LAB — POCT URINALYSIS DIPSTICK
Bilirubin, UA: 4
Glucose, UA: NEGATIVE
Ketones, UA: 5
LEUKOCYTES UA: NEGATIVE
NITRITE UA: NEGATIVE
Protein, UA: 15
RBC UA: NEGATIVE
Spec Grav, UA: >=1.03
UROBILINOGEN UA: 0.2
pH, UA: 5

## 2015-03-18 MED ORDER — CIPROFLOXACIN HCL 500 MG PO TABS: 500.0000 mg | ORAL_TABLET | Freq: Two times a day (BID) | ORAL | Status: DC

## 2015-03-18 NOTE — Progress Notes (Signed)
Pre visit review using our clinic review tool, if applicable. No additional management support is needed unless otherwise documented below in the visit note. 

## 2015-03-18 NOTE — Patient Instructions (Addendum)
Your appear to have a urinary tract infection. I am prescribing cipro antibiotic for the probable infection. Hydrate well. I am sending out a urine culture(please return with urine sample since I will do culture as well as dip study). During the interim if your signs and symptoms worsen rather than improving please notify us. We will notify your when the culture results are back.  At the end you report hx of ammonia elevated. Will get level today at your request.  Follow up in 7 days or as needed.

## 2015-03-18 NOTE — Addendum Note (Signed)
Addended by: Harl Bowie on: 03/18/2015 02:36 PM   Modules accepted: Orders

## 2015-03-18 NOTE — Addendum Note (Signed)
Addended by: Bunnie Domino on: 03/18/2015 02:53 PM   Modules accepted: Orders

## 2015-03-18 NOTE — Progress Notes (Signed)
Subjective:    [name], female    DOB: [birth year], 74 y.o.   MRN: [id number]  HPI  Pt in with some urinary symptoms this weekend.  Pt in today reporting urinary symptoms.  Dysuria- slight only. Frequent urination-Yes. Hesitancy-no Suprapubic pressure-yes Fever-yes subjective. chills-yes Nausea-no Vomiting-no CVA pain-no  History of UTI- yes. Couple in last year or two. Gross hematuria-no  Pt bs this am 127.    Review of Systems  Constitutional: Positive for fever and chills. Negative for fatigue.  Respiratory: Negative for cough, chest tightness, shortness of breath and wheezing.   Cardiovascular: Negative for chest pain and palpitations.  Genitourinary: Positive for dysuria, urgency and frequency. Negative for difficulty urinating, vaginal pain and pelvic pain.  Skin: Negative for rash.  Neurological: Negative for dizziness and headaches.  Hematological: Negative for adenopathy. Does not bruise/bleed easily.    Past Medical History  Diagnosis Date  . Emphysema   . Diabetes mellitus type 2  . Hyperlipidemia   . Cancer breast ca  right  . Anxiety   . Panic attacks   . Depression   . Arthritis of both knees 10/01/2013  . Benign paroxysmal positional vertigo 10/01/2013  . Neck pain 10/01/2013  . Esophageal reflux 10/01/2013  . Pedal edema 12/10/2013  . Overactive bladder 12/10/2013  . Tobacco abuse disorder 02/01/2014  . COPD (chronic obstructive pulmonary disease) 10/01/2013  . Serum ammonia increased 11/10/2014  . Hyperlipidemia, mixed   . Increased ammonia level 11/25/2014  . Diarrhea 12/30/2014  . Neuropathy     feet   . Right knee pain     Social History   Social History  . Marital Status: Single    Spouse Name: N/A  . Number of Children: 0  . Years of Education: N/A   Occupational History  . retire    Social History Main Topics  . Smoking status: Current Some Day Smoker -- 0.50 packs/day    Types: Cigarettes    Start date: 07/27/1964  .  Smokeless tobacco: Never Used     Comment: No smoking x 2 wks on 10/29/14; Social smoker  . Alcohol Use: No  . Drug Use: No  . Sexual Activity: No   Other Topics Concern  . Not on file   Social History Narrative    Past Surgical History  Procedure Laterality Date  . Gallbladder surgery  1992  . Breast surgery  2009 right  . Appendectomy  2007  . Knee surgery    . Mandible fracture surgery    . Pilonidal cyst excision    . Tonsillectomy    . Cataract extraction      x 2    Family History  Problem Relation Age of Onset  . Heart failure Father   . COPD Father   . Arthritis Father 33  . Pneumonia Sister   . Breast cancer    . Stroke Mother   . Arthritis Mother 39  . Hyperlipidemia Mother   . Hypertension Mother   . Diabetes Mother   . Breast cancer Maternal Aunt   . Alcohol abuse Maternal Uncle     Allergies  Allergen Reactions  . Citalopram     Confusion, irregular heart beat.  . Erythromycin     Stomach cramps  . Glimepiride     Elevated ammonia levels  . Versed [Midazolam] Other (See Comments)    Patient stayed confusion stayed 4+days     Current Outpatient Prescriptions on File Prior to  Visit  Medication Sig Dispense Refill  . ADVAIR DISKUS 250-50 MCG/DOSE AEPB INHALE 1 PUFF BY MOUTH TWICE DAILY 60 each 2  . albuterol (PROVENTIL HFA;VENTOLIN HFA) 108 (90 BASE) MCG/ACT inhaler Inhale 2 puffs into the lungs every 6 (six) hours as needed for wheezing or shortness of breath. Only dispense Ventolin 1 Inhaler 3  . albuterol (PROVENTIL) (2.5 MG/3ML) 0.083% nebulizer solution Inhale one vial via nebulizer four times daily as needed.  Dx code 496 360 mL 5  . Cholecalciferol (VITAMIN D3) 2000 UNITS TABS Take 1 capsule by mouth daily.     . diazepam (VALIUM) 5 MG tablet Take 0.5 tablets (2.5 mg total) by mouth daily as needed for anxiety or muscle spasms. Takes 1/2-1 tablets as needed for anxiety 30 tablet 3  . furosemide (LASIX) 20 MG tablet Take 1 tablet (20 mg total)  by mouth every Monday, Wednesday, and Friday. (Patient taking differently: Take 20 mg by mouth daily. Except 2 days out of the week) 30 tablet 3  . glucose blood test strip Use as directed twice daily to check blood sugar.  Diagnosis code E11.9 100 each 6  . Insulin Detemir (LEVEMIR FLEXPEN) 100 UNIT/ML Pen Inject 10 Units into the skin daily at 10 pm. 15 mL 1  . Insulin Pen Needle (PEN NEEDLES) 31G X 6 MM MISC Use as directed with Levemir flexpen. 50 each 6  . ipratropium-albuterol (DUONEB) 0.5-2.5 (3) MG/3ML SOLN Take 3 mLs by nebulization every 4 (four) hours as needed. 360 mL 1  . naproxen sodium (ANAPROX) 220 MG tablet Take 220 mg by mouth 2 (two) times daily with a meal. PRN    . omeprazole (PRILOSEC) 20 MG capsule Take 1 capsule (20 mg total) by mouth daily. 30 capsule 3  . rifaximin (XIFAXAN) 550 MG TABS tablet Take 1 tablet (550 mg total) by mouth 2 (two) times daily. 60 tablet 1  . aspirin EC 81 MG tablet Take 1 tablet (81 mg total) by mouth daily. (Patient not taking: Reported on 02/08/2015) 90 tablet 3  . lactulose (CHRONULAC) 10 GM/15ML solution Take 45 mLs (30 g total) by mouth 2 (two) times daily as needed for mild constipation. 1892 mL 1  . sitaGLIPtin (JANUVIA) 50 MG tablet Take 1 tablet (50 mg total) by mouth daily. Please discontinue all previous prescriptions for this medicaiton (Patient not taking: Reported on 02/08/2015) 30 tablet 3   No current facility-administered medications on file prior to visit.    BP 105/55 mmHg  Pulse 89  Temp(Src) 98 F (36.7 C) (Oral)  Wt 147 lb 3.2 oz (66.769 kg)  SpO2 96%       Objective:   Physical Exam  General Appearance- Not in acute distress.  HEENT Eyes- Scleraeral/Conjuntiva-bilat- Not Yellow. Mouth & Throat- Normal.  Chest and Lung Exam Auscultation: Breath sounds:-Normal. Adventitious sounds:- No Adventitious sounds.  Cardiovascular Auscultation:Rythm - Regular. Heart Sounds -Normal heart  sounds.  Abdomen Inspection:-Inspection Normal.  Palpation/Perucssion: Palpation and Percussion of the abdomen reveal- faint suprapubic  Tenderness, No Rebound tenderness, No rigidity(Guarding) and No Palpable abdominal masses.  Liver:-Normal.  Spleen:- Normal.   Back- no cva tenderness        Assessment & Plan:  Your appear to have a urinary tract infection. I am prescribing cipro antibiotic for the probable infection. Hydrate well. I am sending out a urine culture(please return with urine sample since I will do culture as well as dip study). During the interim if your signs and symptoms worsen rather  than improving please notify us. We will notify your when the culture results are back.  At the end you report hx of ammonia elevated. Will get level today at your request.  Follow up in 7 days or as needed.

## 2015-03-19 ENCOUNTER — Telehealth: Payer: Self-pay | Admitting: Family Medicine

## 2015-03-19 LAB — CBC WITH DIFFERENTIAL/PLATELET
BASOS ABS: 0 10*3/uL (ref 0.0–0.1)
Basophils Relative: 1.1 % (ref 0.0–3.0)
EOS ABS: 0.1 10*3/uL (ref 0.0–0.7)
Eosinophils Relative: 3 % (ref 0.0–5.0)
HCT: 40 % (ref 36.0–46.0)
HEMOGLOBIN: 13.2 g/dL (ref 12.0–15.0)
LYMPHS ABS: 0.9 10*3/uL (ref 0.7–4.0)
Lymphocytes Relative: 22.9 % (ref 12.0–46.0)
MCHC: 33 g/dL (ref 30.0–36.0)
MCV: 89.7 fl (ref 78.0–100.0)
MONO ABS: 0.6 10*3/uL (ref 0.1–1.0)
Monocytes Relative: 15.7 % — ABNORMAL HIGH (ref 3.0–12.0)
NEUTROS PCT: 57.3 % (ref 43.0–77.0)
Neutro Abs: 2.3 10*3/uL (ref 1.4–7.7)
Platelets: 117 10*3/uL — ABNORMAL LOW (ref 150.0–400.0)
RBC: 4.46 Mil/uL (ref 3.87–5.11)
RDW: 16.1 % — ABNORMAL HIGH (ref 11.5–15.5)
WBC: 4 10*3/uL (ref 4.0–10.5)

## 2015-03-19 LAB — COMPREHENSIVE METABOLIC PANEL
ALK PHOS: 119 U/L — AB (ref 39–117)
ALT: 17 U/L (ref 0–35)
AST: 28 U/L (ref 0–37)
Albumin: 3 g/dL — ABNORMAL LOW (ref 3.5–5.2)
BUN: 13 mg/dL (ref 6–23)
CO2: 35 mEq/L — ABNORMAL HIGH (ref 19–32)
CREATININE: 0.67 mg/dL (ref 0.40–1.20)
Calcium: 9.1 mg/dL (ref 8.4–10.5)
Chloride: 99 mEq/L (ref 96–112)
GFR: 91.97 mL/min (ref >60.00)
GLUCOSE: 203 mg/dL — AB (ref 70–99)
POTASSIUM: 4.2 meq/L (ref 3.5–5.1)
SODIUM: 138 meq/L (ref 135–145)
TOTAL PROTEIN: 5.6 g/dL — AB (ref 6.0–8.3)
Total Bilirubin: 1.5 mg/dL — ABNORMAL HIGH (ref 0.2–1.2)

## 2015-03-19 LAB — AMMONIA: Ammonia: 62 umol/L — ABNORMAL HIGH (ref 16–53)

## 2015-03-19 NOTE — Telephone Encounter (Signed)
Pt calling to see if she has UTI. Was very upset to not have a call. Doesn't look like we have results. Please call her at [phone]

## 2015-03-20 LAB — URINE CULTURE: Colony Count: 15000

## 2015-03-20 NOTE — Telephone Encounter (Signed)
Notified pt of results and she voices understanding.

## 2015-03-26 ENCOUNTER — Encounter: Payer: Self-pay | Admitting: Family Medicine

## 2015-03-26 ENCOUNTER — Ambulatory Visit (INDEPENDENT_AMBULATORY_CARE_PROVIDER_SITE_OTHER): Payer: Medicare HMO | Admitting: Family Medicine

## 2015-03-26 VITALS — BP 132/56 | HR 85 | Temp 98.0°F | Ht 67.0 in | Wt 149.5 lb

## 2015-03-26 DIAGNOSIS — R7989 Other specified abnormal findings of blood chemistry: Secondary | ICD-10-CM

## 2015-03-26 DIAGNOSIS — Z23 Encounter for immunization: Secondary | ICD-10-CM | POA: Diagnosis not present

## 2015-03-26 DIAGNOSIS — N3281 Overactive bladder: Secondary | ICD-10-CM | POA: Diagnosis not present

## 2015-03-26 DIAGNOSIS — E782 Mixed hyperlipidemia: Secondary | ICD-10-CM | POA: Diagnosis not present

## 2015-03-26 DIAGNOSIS — J449 Chronic obstructive pulmonary disease, unspecified: Secondary | ICD-10-CM

## 2015-03-26 DIAGNOSIS — E119 Type 2 diabetes mellitus without complications: Secondary | ICD-10-CM | POA: Diagnosis not present

## 2015-03-26 DIAGNOSIS — D696 Thrombocytopenia, unspecified: Secondary | ICD-10-CM

## 2015-03-26 LAB — URINALYSIS
Bilirubin Urine: NEGATIVE
Hgb urine dipstick: NEGATIVE
KETONES UR: NEGATIVE
Leukocytes, UA: NEGATIVE
Nitrite: NEGATIVE
PH: 7 (ref 5.0–8.0)
SPECIFIC GRAVITY, URINE: 1.02 (ref 1.000–1.030)
Total Protein, Urine: NEGATIVE
URINE GLUCOSE: NEGATIVE
UROBILINOGEN UA: 1 (ref 0.0–1.0)

## 2015-03-26 LAB — COMPREHENSIVE METABOLIC PANEL
ALT: 21 U/L (ref 0–35)
AST: 32 U/L (ref 0–37)
Albumin: 3 g/dL — ABNORMAL LOW (ref 3.5–5.2)
Alkaline Phosphatase: 135 U/L — ABNORMAL HIGH (ref 39–117)
BUN: 9 mg/dL (ref 6–23)
CHLORIDE: 103 meq/L (ref 96–112)
CO2: 34 mEq/L — ABNORMAL HIGH (ref 19–32)
Calcium: 9.1 mg/dL (ref 8.4–10.5)
Creatinine, Ser: 0.63 mg/dL (ref 0.40–1.20)
GFR: 98.74 mL/min (ref >60.00)
Glucose, Bld: 149 mg/dL — ABNORMAL HIGH (ref 70–99)
POTASSIUM: 4.1 meq/L (ref 3.5–5.1)
SODIUM: 141 meq/L (ref 135–145)
Total Bilirubin: 1.4 mg/dL — ABNORMAL HIGH (ref 0.2–1.2)
Total Protein: 5.8 g/dL — ABNORMAL LOW (ref 6.0–8.3)

## 2015-03-26 LAB — HEMOGLOBIN A1C: Hgb A1c MFr Bld: 6.6 % — ABNORMAL HIGH (ref 4.6–6.5)

## 2015-03-26 LAB — LIPID PANEL
CHOL/HDL RATIO: 4
Cholesterol: 151 mg/dL (ref 0–200)
HDL: 42.9 mg/dL (ref >39.00)
LDL CALC: 93 mg/dL (ref 0–99)
NONHDL: 107.6
Triglycerides: 74 mg/dL (ref 0.0–149.0)
VLDL: 14.8 mg/dL (ref 0.0–40.0)

## 2015-03-26 LAB — MICROALBUMIN / CREATININE URINE RATIO
CREATININE, U: 136.3 mg/dL
Microalb Creat Ratio: 0.5 mg/g (ref 0.0–30.0)

## 2015-03-26 LAB — TSH: TSH: 0.94 u[IU]/mL (ref 0.35–4.50)

## 2015-03-26 LAB — CBC
HEMATOCRIT: 41.2 % (ref 36.0–46.0)
HEMOGLOBIN: 13.5 g/dL (ref 12.0–15.0)
MCHC: 32.7 g/dL (ref 30.0–36.0)
MCV: 89.9 fl (ref 78.0–100.0)
Platelets: 118 10*3/uL — ABNORMAL LOW (ref 150.0–400.0)
RBC: 4.59 Mil/uL (ref 3.87–5.11)
RDW: 16.5 % — AB (ref 11.5–15.5)
WBC: 3.8 10*3/uL — ABNORMAL LOW (ref 4.0–10.5)

## 2015-03-26 LAB — AMMONIA: AMMONIA: 75 umol/L — AB (ref 16–53)

## 2015-03-26 LAB — VITAMIN D 25 HYDROXY (VIT D DEFICIENCY, FRACTURES): VITD: 72.26 ng/mL (ref 30.00–100.00)

## 2015-03-26 NOTE — Assessment & Plan Note (Signed)
Improved with recent blood draw at 64 will drop Lactulose to once daily and recheck ammonia in 1 week. Has appt with Hepatology on April 08, 2015

## 2015-03-26 NOTE — Progress Notes (Signed)
Pre visit review using our clinic review tool, if applicable. No additional management support is needed unless otherwise documented below in the visit note. 

## 2015-03-26 NOTE — Progress Notes (Signed)
[name], female   DOB: 1940-11-04, 74 y.o.   MRN: [id number]   Subjective:    [name], female    DOB: 1941/05/26, 74 y.o.   MRN: [id number]  Chief Complaint  Patient presents with  . Follow-up    bowel movement problem    HPI Patient is in today for follow up. She was in for follow up on numerous concerns. Was in with urinary frequency and suprapubic pressure. These symptoms resolved with ABX. Feeling better. No other recent illness. She has been using her Lactulose bid and unfortunately has seen blood sugasr over 400 at times. Not often. Has been as low as 90 in am. Notes polyuria after the second dose of Lactulose. No acute concerns. Denies CP/palp/SOB/HA/congestion/fevers/GI or GU c/o. Taking meds as prescribed   Past Medical History  Diagnosis Date  . Emphysema   . Diabetes mellitus type 2  . Hyperlipidemia   . Cancer breast ca  right  . Anxiety   . Panic attacks   . Depression   . Arthritis of both knees 10/01/2013  . Benign paroxysmal positional vertigo 10/01/2013  . Neck pain 10/01/2013  . Esophageal reflux 10/01/2013  . Pedal edema 12/10/2013  . Overactive bladder 12/10/2013  . Tobacco abuse disorder 02/01/2014  . COPD (chronic obstructive pulmonary disease) 10/01/2013  . Serum ammonia increased 11/10/2014  . Hyperlipidemia, mixed   . Increased ammonia level 11/25/2014  . Diarrhea 12/30/2014  . Neuropathy     feet   . Right knee pain     Past Surgical History  Procedure Laterality Date  . Gallbladder surgery  1992  . Breast surgery  2009 right  . Appendectomy  2007  . Knee surgery    . Mandible fracture surgery    . Pilonidal cyst excision    . Tonsillectomy    . Cataract extraction      x 2    Family History  Problem Relation Age of Onset  . Heart failure Father   . COPD Father   . Arthritis Father 68  . Pneumonia Sister   . Breast cancer    . Stroke Mother   . Arthritis Mother 75  . Hyperlipidemia Mother   . Hypertension Mother     . Diabetes Mother   . Breast cancer Maternal Aunt   . Alcohol abuse Maternal Uncle     Social History   Social History  . Marital Status: Single    Spouse Name: N/A  . Number of Children: 0  . Years of Education: N/A   Occupational History  . retire    Social History Main Topics  . Smoking status: Current Some Day Smoker -- 0.50 packs/day    Types: Cigarettes    Start date: 07/27/1964  . Smokeless tobacco: Never Used     Comment: No smoking x 2 wks on 10/29/14; Social smoker  . Alcohol Use: No  . Drug Use: No  . Sexual Activity: No   Other Topics Concern  . Not on file   Social History Narrative    Outpatient Prescriptions Prior to Visit  Medication Sig Dispense Refill  . ADVAIR DISKUS 250-50 MCG/DOSE AEPB INHALE 1 PUFF BY MOUTH TWICE DAILY 60 each 2  . albuterol (PROVENTIL HFA;VENTOLIN HFA) 108 (90 BASE) MCG/ACT inhaler Inhale 2 puffs into the lungs every 6 (six) hours as needed for wheezing or shortness of breath. Only dispense Ventolin 1 Inhaler 3  . albuterol (PROVENTIL) (2.5 MG/3ML) 0.083%  nebulizer solution Inhale one vial via nebulizer four times daily as needed.  Dx code 496 360 mL 5  . aspirin EC 81 MG tablet Take 1 tablet (81 mg total) by mouth daily. 90 tablet 3  . Cholecalciferol (VITAMIN D3) 2000 UNITS TABS Take 1 capsule by mouth daily.     . diazepam (VALIUM) 5 MG tablet Take 0.5 tablets (2.5 mg total) by mouth daily as needed for anxiety or muscle spasms. Takes 1/2-1 tablets as needed for anxiety 30 tablet 3  . furosemide (LASIX) 20 MG tablet Take 1 tablet (20 mg total) by mouth every Monday, Wednesday, and Friday. (Patient taking differently: Take 20 mg by mouth daily. Except 2 days out of the week) 30 tablet 3  . glucose blood test strip Use as directed twice daily to check blood sugar.  Diagnosis code E11.9 100 each 6  . Insulin Detemir (LEVEMIR FLEXPEN) 100 UNIT/ML Pen Inject 10 Units into the skin daily at 10 pm. 15 mL 1  . Insulin Pen Needle (PEN  NEEDLES) 31G X 6 MM MISC Use as directed with Levemir flexpen. 50 each 6  . ipratropium-albuterol (DUONEB) 0.5-2.5 (3) MG/3ML SOLN Take 3 mLs by nebulization every 4 (four) hours as needed. 360 mL 1  . lactulose (CHRONULAC) 10 GM/15ML solution Take 45 mLs (30 g total) by mouth 2 (two) times daily as needed for mild constipation. 1892 mL 1  . naproxen sodium (ANAPROX) 220 MG tablet Take 220 mg by mouth 2 (two) times daily with a meal. PRN    . omeprazole (PRILOSEC) 20 MG capsule Take 1 capsule (20 mg total) by mouth daily. 30 capsule 3  . ciprofloxacin (CIPRO) 500 MG tablet Take 1 tablet (500 mg total) by mouth 2 (two) times daily. 14 tablet 0  . rifaximin (XIFAXAN) 550 MG TABS tablet Take 1 tablet (550 mg total) by mouth 2 (two) times daily. 60 tablet 1  . sitaGLIPtin (JANUVIA) 50 MG tablet Take 1 tablet (50 mg total) by mouth daily. Please discontinue all previous prescriptions for this medicaiton 30 tablet 3   No facility-administered medications prior to visit.    Allergies  Allergen Reactions  . Citalopram     Confusion, irregular heart beat.  . Erythromycin     Stomach cramps  . Glimepiride     Elevated ammonia levels  . Versed [Midazolam] Other (See Comments)    Patient stayed confusion stayed 4+days     Review of Systems  Constitutional: Negative for fever and malaise/fatigue.  HENT: Negative for congestion.   Eyes: Negative for discharge.  Respiratory: Negative for shortness of breath.   Cardiovascular: Negative for chest pain, palpitations and leg swelling.  Gastrointestinal: Positive for diarrhea and constipation. Negative for nausea and abdominal pain.  Genitourinary: Negative for dysuria.  Musculoskeletal: Negative for falls.  Skin: Negative for rash.  Neurological: Negative for loss of consciousness and headaches.  Endo/Heme/Allergies: Negative for environmental allergies.  Psychiatric/Behavioral: Negative for depression. The patient is not nervous/anxious.          Objective:    Physical Exam  Constitutional: She is oriented to person, place, and time. She appears well-developed and well-nourished. No distress.  HENT:  Head: Normocephalic and atraumatic.  Nose: Nose normal.  Eyes: Right eye exhibits no discharge. Left eye exhibits no discharge.  Neck: Normal range of motion. Neck supple.  Cardiovascular: Normal rate and regular rhythm.   No murmur heard. Pulmonary/Chest: Effort normal and breath sounds normal.  Abdominal: Soft. Bowel sounds are  normal. There is no tenderness.  Musculoskeletal: She exhibits no edema.  Neurological: She is alert and oriented to person, place, and time.  Skin: Skin is warm and dry.  Psychiatric: She has a normal mood and affect.  Nursing note and vitals reviewed.   BP 132/56 mmHg  Pulse 85  Temp(Src) 98 F (36.7 C) (Oral)  Ht _0  (1.702 m)  Wt 149 lb 8 oz (67.813 kg)  BMI 23.41 kg/m2  SpO2 93% Wt Readings from Last 3 Encounters:  03/26/15 149 lb 8 oz (67.813 kg)  03/18/15 147 lb 3.2 oz (66.769 kg)  02/08/15 150 lb 3.2 oz (68.13 kg)     Lab Results  Component Value Date   WBC 4.0 03/18/2015   HGB 13.2 03/18/2015   HCT 40.0 03/18/2015   PLT 117.0* 03/18/2015   GLUCOSE 203* 03/18/2015   CHOL 167 04/12/2014   TRIG 83.0 04/12/2014   HDL 52.30 04/12/2014   LDLCALC 98 04/12/2014   ALT 17 03/18/2015   AST 28 03/18/2015   NA 138 03/18/2015   K 4.2 03/18/2015   CL 99 03/18/2015   CREATININE 0.67 03/18/2015   BUN 13 03/18/2015   CO2 35* 03/18/2015   TSH 0.864 10/16/2014   HGBA1C 6.8* 04/12/2014   MICROALBUR 0.83 12/08/2013    Lab Results  Component Value Date   TSH 0.864 10/16/2014   Lab Results  Component Value Date   WBC 4.0 03/18/2015   HGB 13.2 03/18/2015   HCT 40.0 03/18/2015   MCV 89.7 03/18/2015   PLT 117.0* 03/18/2015   Lab Results  Component Value Date   NA 138 03/18/2015   K 4.2 03/18/2015   CHLORIDE 98 09/26/2014   CO2 35* 03/18/2015   GLUCOSE 203* 03/18/2015   BUN  13 03/18/2015   CREATININE 0.67 03/18/2015   BILITOT 1.5* 03/18/2015   ALKPHOS 119* 03/18/2015   AST 28 03/18/2015   ALT 17 03/18/2015   PROT 5.6* 03/18/2015   ALBUMIN 3.0* 03/18/2015   CALCIUM 9.1 03/18/2015   ANIONGAP 11 10/18/2014   EGFR 77* 09/26/2014   GFR 91.97 03/18/2015   Lab Results  Component Value Date   CHOL 167 04/12/2014   Lab Results  Component Value Date   HDL 52.30 04/12/2014   Lab Results  Component Value Date   LDLCALC 98 04/12/2014   Lab Results  Component Value Date   TRIG 83.0 04/12/2014   Lab Results  Component Value Date   CHOLHDL 3 04/12/2014   Lab Results  Component Value Date   HGBA1C 6.8* 04/12/2014       Assessment & Plan:   Diabetes mellitus type 2, controlled Is tolerating Lantus 10 units at 10 pm but is still spiking above 400 especially after a Lactulose dose, will drop Lactulose to once daily again and move Lantus to am then increase then increase the Lantus to 14 units then increase by 2 units every 3 days if no blood sugars drop below 110.  Tobacco abuse disorder Encouraged complete cessation. Discussed need to quit as relates to risk of numerous cancers, cardiac and pulmonary disease as well as neurologic complications. Counseled for greater than 3 minutes  Increased ammonia level Improved with recent blood draw at 62 will drop Lactulose to once daily and recheck ammonia in 1 week. 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 Is in donut hole and having trouble affording the Advair. Is going to make a  follow up appt with pulmonology to discuss medication changes due to cost.  Thrombocytopenia Check cbc today   I have discontinued Ms. Vecchiarelli sitaGLIPtin, rifaximin, and ciprofloxacin. I am also having her maintain her Vitamin D3, albuterol, aspirin EC, furosemide, omeprazole, ipratropium-albuterol, diazepam, glucose blood, albuterol, naproxen sodium,  Insulin Detemir, Pen Needles, lactulose, and ADVAIR DISKUS.  No orders of the defined types were placed in this encounter.     Elizabeth Sauer, LPN

## 2015-03-26 NOTE — Assessment & Plan Note (Signed)
Is in donut hole and having trouble affording the Advair. Is going to make a follow up appt with pulmonology to discuss medication changes due to cost.

## 2015-03-26 NOTE — Assessment & Plan Note (Signed)
Encouraged complete cessation. Discussed need to quit as relates to risk of numerous cancers, cardiac and pulmonary disease as well as neurologic complications. Counseled for greater than 3 minutes 

## 2015-03-26 NOTE — Assessment & Plan Note (Signed)
Check cbc today 

## 2015-03-26 NOTE — Patient Instructions (Addendum)
increase the Lantus to 14 units then increase by 2 units every 3 days if no blood sugars drop below 110. Drop Lactulose to once daily   Ask pharmacy to tell you what inhaled steroid is cheaper than Advair on your formulary   Nicotine Addiction Nicotine can act as both a stimulant (excites/activates) and a sedative (calms/quiets). Immediately after exposure to nicotine, there is a "kick" caused in part by the drug's stimulation of the adrenal glands and resulting discharge of adrenaline (epinephrine). The rush of adrenaline stimulates the body and causes a sudden release of sugar. This means that smokers are always slightly hyperglycemic. Hyperglycemic means that the blood sugar is high, just like in diabetics. Nicotine also decreases the amount of insulin which helps control sugar levels in the body. There is an increase in blood pressure, breathing, and the rate of heart beats.  In addition, nicotine indirectly causes a release of dopamine in the brain that controls pleasure and motivation. A similar reaction is seen with other drugs of abuse, such as cocaine and heroin. This dopamine release is thought to cause the pleasurable sensations when smoking. In some different cases, nicotine can also create a calming effect, depending on sensitivity of the smoker's nervous system and the dose of nicotine taken. WHAT HAPPENS WHEN NICOTINE IS TAKEN FOR LONG PERIODS OF TIME?  Long-term use of nicotine results in addiction. It is difficult to stop.  Repeated use of nicotine creates tolerance. Higher doses of nicotine are needed to get the "kick." When nicotine use is stopped, withdrawal may last a month or more. Withdrawal may begin within a few hours after the last cigarette. Symptoms peak within the first few days and may lessen within a few weeks. For some people, however, symptoms may last for months or longer. Withdrawal symptoms include:   Irritability.  Craving.  Learning and attention  deficits.  Sleep disturbances.  Increased appetite. Craving for tobacco may last for 6 months or longer. Many behaviors done while using nicotine can also play a part in the severity of withdrawal symptoms. For some people, the feel, smell, and sight of a cigarette and the ritual of obtaining, handling, lighting, and smoking the cigarette are closely linked with the pleasure of smoking. When stopped, they also miss the related behaviors which make the withdrawal or craving worse. While nicotine gum and patches may lessen the drug aspects of withdrawal, cravings often persist. WHAT ARE THE MEDICAL CONSEQUENCES OF NICOTINE USE?  Nicotine addiction accounts for one-third of all cancers. The top cancer caused by tobacco is lung cancer. Lung cancer is the number one cancer killer of both men and women.  Smoking is also associated with cancers of the:  Mouth.  Pharynx.  Larynx.  Esophagus.  Stomach.  Pancreas.  Cervix.  Kidney.  Ureter.  Bladder.  Smoking also causes lung diseases such as lasting (chronic) bronchitis and emphysema.  It worsens asthma in adults and children.  Smoking increases the risk of heart disease, including:  Stroke.  Heart attack.  Vascular disease.  Aneurysm.  Passive or secondary smoke can also increase medical risks including:  Asthma in children.  Sudden Infant Death Syndrome (SIDS).  Additionally, dropped cigarettes are the leading cause of residential fire fatalities.  Nicotine poisoning has been reported from accidental ingestion of tobacco products by children and pets. Death usually results in a few minutes from respiratory failure (when a person stops breathing) caused by paralysis. TREATMENT   Medication. Nicotine replacement medicines such as nicotine gum  and the patch are used to stop smoking. These medicines gradually lower the dosage of nicotine in the body. These medicines do not contain the carbon monoxide and other toxins  found in tobacco smoke.  Hypnotherapy.  Relaxation therapy.  Nicotine Anonymous (a 12-step support program). Find times and locations in your local yellow pages. Document Released: 03/18/2004 Document Revised: 10/05/2011 Document Reviewed: 09/08/2013 [employer] Patient Information 2015 [place], [place]. This information is not intended to replace advice given to you by your health care provider. Make sure you discuss any questions you have with your health care provider.

## 2015-03-26 NOTE — Assessment & Plan Note (Signed)
Is tolerating Lantus 10 units at 10 pm but is still spiking above 400 especially after a Lactulose dose, will drop Lactulose to once daily again and move Lantus to am then increase then increase the Lantus to 14 units then increase by 2 units every 3 days if no blood sugars drop below 110.

## 2015-03-28 LAB — URINE CULTURE
COLONY COUNT: NO GROWTH
ORGANISM ID, BACTERIA: NO GROWTH

## 2015-04-17 ENCOUNTER — Ambulatory Visit: Payer: Medicare HMO | Admitting: Internal Medicine

## 2015-04-17 ENCOUNTER — Other Ambulatory Visit: Payer: Self-pay | Admitting: Family Medicine

## 2015-04-26 ENCOUNTER — Ambulatory Visit: Payer: Medicare HMO | Admitting: Family Medicine

## 2015-04-30 ENCOUNTER — Ambulatory Visit (INDEPENDENT_AMBULATORY_CARE_PROVIDER_SITE_OTHER): Payer: Medicare HMO | Admitting: Family Medicine

## 2015-04-30 ENCOUNTER — Encounter: Payer: Self-pay | Admitting: Family Medicine

## 2015-04-30 VITALS — BP 114/51 | HR 75 | Temp 97.6°F | Ht 67.0 in | Wt 152.0 lb

## 2015-04-30 DIAGNOSIS — J449 Chronic obstructive pulmonary disease, unspecified: Secondary | ICD-10-CM | POA: Diagnosis not present

## 2015-04-30 DIAGNOSIS — K769 Liver disease, unspecified: Secondary | ICD-10-CM | POA: Diagnosis not present

## 2015-04-30 DIAGNOSIS — K7682 Hepatic encephalopathy: Secondary | ICD-10-CM

## 2015-04-30 DIAGNOSIS — R197 Diarrhea, unspecified: Secondary | ICD-10-CM

## 2015-04-30 DIAGNOSIS — K219 Gastro-esophageal reflux disease without esophagitis: Secondary | ICD-10-CM

## 2015-04-30 DIAGNOSIS — K7290 Hepatic failure, unspecified without coma: Secondary | ICD-10-CM

## 2015-04-30 DIAGNOSIS — E118 Type 2 diabetes mellitus with unspecified complications: Secondary | ICD-10-CM | POA: Diagnosis not present

## 2015-04-30 DIAGNOSIS — E782 Mixed hyperlipidemia: Secondary | ICD-10-CM | POA: Diagnosis not present

## 2015-04-30 DIAGNOSIS — Z720 Tobacco use: Secondary | ICD-10-CM

## 2015-04-30 NOTE — Patient Instructions (Signed)

## 2015-04-30 NOTE — Assessment & Plan Note (Signed)
Avoid offending foods, start probiotics. Do not eat large meals in late evening and consider raising head of bed.  

## 2015-04-30 NOTE — Progress Notes (Signed)
Pre visit review using our clinic review tool, if applicable. No additional management support is needed unless otherwise documented below in the visit note. 

## 2015-04-30 NOTE — Assessment & Plan Note (Signed)
Better with decreased dosing of Lactulose

## 2015-04-30 NOTE — Assessment & Plan Note (Signed)
Unfortunately continues to smoke but no recent exacerbations.

## 2015-04-30 NOTE — Assessment & Plan Note (Signed)
hgba1c acceptable, minimize simple carbs. Increase exercise as tolerated. Continue current meds 

## 2015-04-30 NOTE — Assessment & Plan Note (Signed)
Encouraged complete cessation. Discussed need to quit as relates to risk of numerous cancers, cardiac and pulmonary disease as well as neurologic complications. Counseled for greater than 3 minutes 

## 2015-04-30 NOTE — Assessment & Plan Note (Addendum)
MILD Following now with [facility]  Hi ammonia but clinically doing well, no confusion just fine tremors in hands Encouraged to manage with Lactulose to 1-3 x daily Low sodium diet Check Alpha-1 Antitrypsan today Has been encouraged to proceed with upper endocscopy

## 2015-04-30 NOTE — Assessment & Plan Note (Signed)
Encouraged heart healthy diet, increase exercise, avoid trans fats, consider a krill oil cap daily 

## 2015-05-02 ENCOUNTER — Encounter: Payer: Self-pay | Admitting: Gastroenterology

## 2015-05-02 LAB — ALPHA-1-ANTITRYPSIN: A-1 Antitrypsin, Ser: 178 mg/dL (ref 83–199)

## 2015-05-05 NOTE — Progress Notes (Signed)
Subjective:    [name], female    DOB: 01-22-1941, 74 y.o.   MRN: [id number]  Chief Complaint  Patient presents with  . Follow-up    HPI Patient is in today for follow-up. She is actually feeling fairly well. She is now following with [facility]. They have been monitoring her blood work and they report it is no longer necessary to follow her ammonia levels unless she has a mental status change. She continues to struggle with dyspnea and unfortunately continues to smoke but has no new or acute complaints. No recent illness or hospitalization. Denies CP/palp/HA/congestion/fevers/GI or GU c/o. Taking meds as prescribed  Past Medical History  Diagnosis Date  . Emphysema   . Diabetes mellitus type 2  . Hyperlipidemia   . Cancer [facility]) breast ca  right  . Anxiety   . Panic attacks   . Depression   . Arthritis of both knees 10/01/2013  . Benign paroxysmal positional vertigo 10/01/2013  . Neck pain 10/01/2013  . Esophageal reflux 10/01/2013  . Pedal edema 12/10/2013  . Overactive bladder 12/10/2013  . Tobacco abuse disorder 02/01/2014  . COPD (chronic obstructive pulmonary disease) ([place]) 10/01/2013  . Serum ammonia increased ([place]) 11/10/2014  . Hyperlipidemia, mixed   . Increased ammonia level 11/25/2014  . Diarrhea 12/30/2014  . Neuropathy (HCC)     feet   . Right knee pain   . Encephalopathy, hepatic ([place]) 06/07/2014    Past Surgical History  Procedure Laterality Date  . Gallbladder surgery  1992  . Breast surgery  2009 right  . Appendectomy  2007  . Knee surgery    . Mandible fracture surgery    . Pilonidal cyst excision    . Tonsillectomy    . Cataract extraction      x 2    Family History  Problem Relation Age of Onset  . Heart failure Father   . COPD Father   . Arthritis Father 22  . Pneumonia Sister   . Breast cancer    . Stroke Mother   . Arthritis Mother 3  . Hyperlipidemia Mother   . Hypertension Mother   . Diabetes Mother    . Breast cancer Maternal Aunt   . Alcohol abuse Maternal Uncle     Social History   Social History  . Marital Status: Single    Spouse Name: N/A  . Number of Children: 0  . Years of Education: N/A   Occupational History  . retire    Social History Main Topics  . Smoking status: Current Some Day Smoker -- 0.50 packs/day    Types: Cigarettes    Start date: 07/27/1964  . Smokeless tobacco: Never Used     Comment: No smoking x 2 wks on 10/29/14; Social smoker  . Alcohol Use: No  . Drug Use: No  . Sexual Activity: No   Other Topics Concern  . Not on file   Social History Narrative    Outpatient Prescriptions Prior to Visit  Medication Sig Dispense Refill  . ADVAIR DISKUS 250-50 MCG/DOSE AEPB INHALE 1 PUFF BY MOUTH TWICE DAILY 60 each 2  . albuterol (PROVENTIL HFA;VENTOLIN HFA) 108 (90 BASE) MCG/ACT inhaler Inhale 2 puffs into the lungs every 6 (six) hours as needed for wheezing or shortness of breath. Only dispense Ventolin 1 Inhaler 3  . albuterol (PROVENTIL) (2.5 MG/3ML) 0.083% nebulizer solution Inhale one vial via nebulizer four times daily as needed.  Dx code 76  360 mL 5  . Cholecalciferol (VITAMIN D3) 2000 UNITS TABS Take 1 capsule by mouth daily.     . diazepam (VALIUM) 5 MG tablet Take 0.5 tablets (2.5 mg total) by mouth daily as needed for anxiety or muscle spasms. Takes 1/2-1 tablets as needed for anxiety 30 tablet 3  . furosemide (LASIX) 20 MG tablet Take 1 tablet (20 mg total) by mouth every Monday, Wednesday, and Friday. (Patient taking differently: Take 20 mg by mouth daily. Except 2 days out of the week) 30 tablet 3  . glucose blood test strip Use as directed twice daily to check blood sugar.  Diagnosis code E11.9 100 each 6  . Insulin Detemir (LEVEMIR FLEXPEN) 100 UNIT/ML Pen Inject 10 Units into the skin daily at 10 pm. 15 mL 1  . Insulin Pen Needle (PEN NEEDLES) 31G X 6 MM MISC Use as directed with Levemir flexpen. 50 each 6  . ipratropium-albuterol (DUONEB)  0.5-2.5 (3) MG/3ML SOLN Take 3 mLs by nebulization every 4 (four) hours as needed. 360 mL 1  . lactulose (CHRONULAC) 10 GM/15ML solution Take 45 mLs (30 g total) by mouth 2 (two) times daily as needed for mild constipation. 1892 mL 1  . naproxen sodium (ANAPROX) 220 MG tablet Take 220 mg by mouth 2 (two) times daily with a meal. PRN    . omeprazole (PRILOSEC) 20 MG capsule TAKE 1 CAPSULE(20 MG) BY MOUTH DAILY 30 capsule 0  . aspirin EC 81 MG tablet Take 1 tablet (81 mg total) by mouth daily. 90 tablet 3   No facility-administered medications prior to visit.    Allergies  Allergen Reactions  . Citalopram     Confusion, irregular heart beat.  . Erythromycin     Stomach cramps  . Glimepiride     Elevated ammonia levels  . Versed [Midazolam] Other (See Comments)    Patient stayed confusion stayed 4+days     Review of Systems  Constitutional: Negative for fever and malaise/fatigue.  HENT: Negative for congestion.   Eyes: Negative for discharge.  Respiratory: Positive for shortness of breath.   Cardiovascular: Negative for chest pain, palpitations and leg swelling.  Gastrointestinal: Negative for nausea and abdominal pain.  Genitourinary: Negative for dysuria.  Musculoskeletal: Negative for falls.  Skin: Negative for rash.  Neurological: Negative for loss of consciousness and headaches.  Endo/Heme/Allergies: Negative for environmental allergies.  Psychiatric/Behavioral: Negative for depression. The patient is not nervous/anxious.        Objective:    Physical Exam  Constitutional: She is oriented to person, place, and time. She appears well-developed and well-nourished. No distress.  HENT:  Head: Normocephalic and atraumatic.  Nose: Nose normal.  Eyes: Right eye exhibits no discharge. Left eye exhibits no discharge.  Neck: Normal range of motion. Neck supple.  Cardiovascular: Normal rate and regular rhythm.   No murmur heard. Pulmonary/Chest: Effort normal and breath sounds  normal.  Abdominal: Soft. Bowel sounds are normal. There is no tenderness.  Musculoskeletal: She exhibits no edema.  Neurological: She is alert and oriented to person, place, and time.  Skin: Skin is warm and dry.  Psychiatric: She has a normal mood and affect.  Nursing note and vitals reviewed.   BP 114/51 mmHg  Pulse 75  Temp(Src) 97.6 F (36.4 C) (Oral)  Ht 5' 7" (1.702 m)  Wt 152 lb (68.947 kg)  BMI 23.80 kg/m2  SpO2 96% Wt Readings from Last 3 Encounters:  04/30/15 152 lb (68.947 kg)  03/26/15 149 lb 8  oz (67.813 kg)  03/18/15 147 lb 3.2 oz (66.769 kg)     Lab Results  Component Value Date   WBC 3.8* 03/26/2015   HGB 13.5 03/26/2015   HCT 41.2 03/26/2015   PLT 118.0* 03/26/2015   GLUCOSE 149* 03/26/2015   CHOL 151 03/26/2015   TRIG 74.0 03/26/2015   HDL 42.90 03/26/2015   LDLCALC 93 03/26/2015   ALT 21 03/26/2015   AST 32 03/26/2015   NA 141 03/26/2015   K 4.1 03/26/2015   CL 103 03/26/2015   CREATININE 0.63 03/26/2015   BUN 9 03/26/2015   CO2 34* 03/26/2015   TSH 0.94 03/26/2015   HGBA1C 6.6* 03/26/2015   MICROALBUR <0.7 03/26/2015    Lab Results  Component Value Date   TSH 0.94 03/26/2015   Lab Results  Component Value Date   WBC 3.8* 03/26/2015   HGB 13.5 03/26/2015   HCT 41.2 03/26/2015   MCV 89.9 03/26/2015   PLT 118.0* 03/26/2015   Lab Results  Component Value Date   NA 141 03/26/2015   K 4.1 03/26/2015   CHLORIDE 98 09/26/2014   CO2 34* 03/26/2015   GLUCOSE 149* 03/26/2015   BUN 9 03/26/2015   CREATININE 0.63 03/26/2015   BILITOT 1.4* 03/26/2015   ALKPHOS 135* 03/26/2015   AST 32 03/26/2015   ALT 21 03/26/2015   PROT 5.8* 03/26/2015   ALBUMIN 3.0* 03/26/2015   CALCIUM 9.1 03/26/2015   ANIONGAP 11 10/18/2014   EGFR 77* 09/26/2014   GFR 98.74 03/26/2015   Lab Results  Component Value Date   CHOL 151 03/26/2015   Lab Results  Component Value Date   HDL 42.90 03/26/2015   Lab Results  Component Value Date   LDLCALC 93  03/26/2015   Lab Results  Component Value Date   TRIG 74.0 03/26/2015   Lab Results  Component Value Date   CHOLHDL 4 03/26/2015   Lab Results  Component Value Date   HGBA1C 6.6* 03/26/2015       Assessment & Plan:   Problem List Items Addressed This Visit    Tobacco abuse disorder    Encouraged complete cessation. Discussed need to quit as relates to risk of numerous cancers, cardiac and pulmonary disease as well as neurologic complications. 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    Unfortunately continues to smoke but no recent exacerbations.      Hyperlipidemia, mixed    Encouraged heart healthy diet, increase exercise, avoid trans fats, consider a krill oil cap daily      Esophageal reflux    Avoid offending foods, start probiotics. Do not eat large meals in late evening and consider raising head of bed.       Relevant Orders   Ambulatory referral to Gastroenterology   Encephalopathy, hepatic [facility])    MILD Following now with [facility], [facility]  Hi ammonia but clinically doing well, no confusion just fine tremors in hands Encouraged to manage with Lactulose to 1-3 x daily Low sodium diet Check Alpha-1 Antitrypsan today Has been encouraged to proceed with upper endocscopy       Relevant Orders   Ambulatory referral to Gastroenterology   Diarrhea    Better with decreased dosing of Lactulose      Diabetes mellitus type 2, controlled (HCC)    hgba1c acceptable, minimize simple carbs. Increase exercise as tolerated. Continue current meds  Other Visit Diagnoses    Liver disease, chronic    -  Primary    Relevant Orders    Alpha-1-Antitrypsin (Completed)    Ambulatory referral to Gastroenterology    Chronic obstructive pulmonary disease, unspecified COPD type ([place])        Relevant Orders    Alpha-1-Antitrypsin (Completed)    Ambulatory referral to  Pulmonology       I have discontinued Ms. Ovitt aspirin EC. I am also having her maintain her Vitamin D3, albuterol, furosemide, ipratropium-albuterol, diazepam, glucose blood, albuterol, naproxen sodium, Insulin Detemir, Pen Needles, lactulose, ADVAIR DISKUS, and omeprazole.  No orders of the defined types were placed in this encounter.     Penni Homans, MD

## 2015-05-23 ENCOUNTER — Ambulatory Visit (INDEPENDENT_AMBULATORY_CARE_PROVIDER_SITE_OTHER): Payer: Medicare HMO | Admitting: Adult Health

## 2015-05-23 ENCOUNTER — Encounter: Payer: Self-pay | Admitting: Adult Health

## 2015-05-23 VITALS — BP 117/68 | HR 75 | Temp 97.4°F | Ht 67.0 in | Wt 150.0 lb

## 2015-05-23 DIAGNOSIS — Z720 Tobacco use: Secondary | ICD-10-CM | POA: Diagnosis not present

## 2015-05-23 DIAGNOSIS — J449 Chronic obstructive pulmonary disease, unspecified: Secondary | ICD-10-CM | POA: Diagnosis not present

## 2015-05-23 MED ORDER — FLUTICASONE-SALMETEROL 250-50 MCG/DOSE IN AEPB: 1.0000 | INHALATION_SPRAY | Freq: Two times a day (BID) | RESPIRATORY_TRACT | Status: DC

## 2015-05-23 MED ORDER — FLUTICASONE-SALMETEROL 500-50 MCG/DOSE IN AEPB: 1.0000 | INHALATION_SPRAY | Freq: Two times a day (BID) | RESPIRATORY_TRACT | Status: DC

## 2015-05-23 NOTE — Progress Notes (Signed)
Reviewed & agree with plan  

## 2015-05-23 NOTE — Assessment & Plan Note (Signed)
Compensated on present regimen  Advair samples given, will need to look at first of year to make sure affordable with new insurance.  Smoking cessastion discussed.   Plan  Continue on  Advair Twice daily  .  Use samples , if Advair is too expensive after the first of year , call back and we can look at some alternatives.  Stop smoking,  Follow up with Dr. Elsworth Soho  In 6 months and As needed

## 2015-05-23 NOTE — Assessment & Plan Note (Signed)
Smoking cessation  

## 2015-05-23 NOTE — Addendum Note (Signed)
Addended by: Mathis Dad on: 05/23/2015 12:48 PM   Modules accepted: Orders

## 2015-05-23 NOTE — Patient Instructions (Addendum)
Continue on  Advair Twice daily  .  Use samples , if Advair is too expensive after the first of year , call back and we can look at some alternatives.  Stop smoking,  Follow up with Dr. Elsworth Soho  In 6 months and As needed

## 2015-05-23 NOTE — Progress Notes (Signed)
   Subjective:    [name], female    DOB: 1940-10-02, 74 y.o.   MRN: [id number]  HPI  74 year old female, smoker with gold C COPD  Test Spirometry July 2014 with FEV1 of 41%, ratio 45, FVC 68%  05/23/2015 follow-up: COPD-GOLD C , smoker Returns for a six-month follow-up. Patient is maintained on Advair twice daily He says that she has been having trouble affording this. She continues to smoke, smoking cessation was discussed Flu shot is utd. PVX and Prevnar are utd.  CXR in March 2016 w/ nad.  Says she has good and bad days. Gets winded easily.  No chest pain, orthopnea, hemoptysis , n/v/d.   Says she has some chronic liver dz , followed by Hepatologist with recent  Dx of Cirrhosis -non alcoholic .     Review of Systems  Constitutional:   No  weight loss, night sweats,  Fevers, chills,  +fatigue, or  lassitude.  HEENT:   No headaches,  Difficulty swallowing,  Tooth/dental problems, or  Sore throat,                No sneezing, itching, ear ache, nasal congestion, post nasal drip,   CV:  No chest pain,  Orthopnea, PND, swelling in lower extremities, anasarca, dizziness, palpitations, syncope.   GI  No heartburn, indigestion, abdominal pain, nausea, vomiting, diarrhea, change in bowel habits, loss of appetite, bloody stools.   Resp:   No chest wall deformity  Skin: no rash or lesions.  GU: no dysuria, change in color of urine, no urgency or frequency.  No flank pain, no hematuria   MS:  No joint pain or swelling.  No decreased range of motion.  No back pain.  Psych:  No change in mood or affect. No depression or anxiety.  No memory loss.          Objective:   Physical Exam GEN: A/Ox3; pleasant , NAD, chronically ill appearing , frail and elderly , smells strong of smoke   HEENT:  [place]/AT,  EACs-clear, TMs-wnl, NOSE-clear, THROAT-clear, no lesions, no postnasal drip or exudate noted.   NECK:  Supple w/ fair ROM; no JVD; normal carotid impulses w/o  bruits; no thyromegaly or nodules palpated; no lymphadenopathy.  RESP  Decreased BS in bases , no accessory muscle use, no dullness to percussion  CARD:  RRR, no m/r/g  , tr-1+ peripheral edema, pulses intact, no cyanosis or clubbing.  GI:   Soft & nt; nml bowel sounds; no organomegaly or masses detected.  Musco: Warm bil, no deformities or joint swelling noted.   Neuro: alert, no focal deficits noted.    Skin: Warm, no lesions or rashes        Assessment & Plan:

## 2015-06-24 ENCOUNTER — Telehealth: Payer: Self-pay | Admitting: Family Medicine

## 2015-06-24 NOTE — Telephone Encounter (Signed)
[facility]  Patient Name: Ashley Savage  DOB: Mar 22, 1941    Initial Comment Caller states has COPD; mucus is getting out of control; breathes fine when sitting but coughs more when movign around ( Gender is unknown)    Nurse Assessment  Nurse: Harlow Mares, RN, Suanne Marker Date/Time Eilene Ghazi Time): 06/24/2015 11:35:54 AM  Confirm and document reason for call. If symptomatic, describe symptoms. ---Caller states has COPD (non O2 dependent); mucus is getting out of control; breathes fine when sitting but coughs more when moving around ( Gender is unknown). Reports SOB is worse when she lies down as well as when she is active. Reports that SOB is increasing. Reports some chest and nasal congestion. Reports that she has taken prednisone in the past with these symptoms.  Has the patient traveled out of the country within the last 30 days? ---No  Does the patient have any new or worsening symptoms? ---Yes  Will a triage be completed? ---Yes  Related visit to physician within the last 2 weeks? ---No  Does the PT have any chronic conditions? (i.e. diabetes, asthma, etc.) ---Yes  List chronic conditions. ---COPD; cirrhosis of liver; diabetes (insulin dependant)  Is this a behavioral health call? ---No     Guidelines    Guideline Title Affirmed Question Affirmed Notes  Asthma Attack [1] MILD asthma attack (e.g., no SOB at rest, mild SOB with walking, speaks normally in sentences, mild wheezing) AND [2] persists > 24 hours on appropriate treatment    Final Disposition User   See Physician within 24 Hours Harlow Mares, RN, Suanne Marker    Comments  Appt scheduled for 06/25/15 @ 9:15am with Dr. Leeanne Rio. Caller voiced understanding.   Referrals  REFERRED TO PCP OFFICE   Disagree/Comply: Comply

## 2015-06-25 ENCOUNTER — Ambulatory Visit (INDEPENDENT_AMBULATORY_CARE_PROVIDER_SITE_OTHER): Payer: Medicare HMO | Admitting: Physician Assistant

## 2015-06-25 ENCOUNTER — Encounter: Payer: Self-pay | Admitting: Physician Assistant

## 2015-06-25 VITALS — BP 105/43 | HR 71 | Temp 97.8°F | Resp 18 | Ht 67.0 in | Wt 153.2 lb

## 2015-06-25 DIAGNOSIS — J441 Chronic obstructive pulmonary disease with (acute) exacerbation: Secondary | ICD-10-CM | POA: Diagnosis not present

## 2015-06-25 MED ORDER — ALBUTEROL SULFATE (2.5 MG/3ML) 0.083% IN NEBU
2.5000 mg | INHALATION_SOLUTION | Freq: Once | RESPIRATORY_TRACT | Status: AC
  Administered 2015-06-25: 2.5 mg via RESPIRATORY_TRACT

## 2015-06-25 MED ORDER — PREDNISONE 20 MG PO TABS: 40.0000 mg | ORAL_TABLET | Freq: Every day | ORAL | Status: DC

## 2015-06-25 NOTE — Assessment & Plan Note (Signed)
Neb given x 1. Improvement in wheeze. Resume all COPD medications as directed. Will order new home neb machine. Rx prednisone 40 mg x 5 days. Restart Mucinex.

## 2015-06-25 NOTE — Progress Notes (Signed)
Pre visit review using our clinic review tool, if applicable. No additional management support is needed unless otherwise documented below in the visit note/SLS  

## 2015-06-25 NOTE — Patient Instructions (Signed)
Continue your chronic COPD medications as directed. Take plain Mucinex twice daily. Increase fluid intake. Take the prednisone as directed.  Follow-up if symptoms are not resolving.  Chronic Obstructive Pulmonary Disease Chronic obstructive pulmonary disease (COPD) is a common lung condition in which airflow from the lungs is limited. COPD is a general term that can be used to describe many different lung problems that limit airflow, including both chronic bronchitis and emphysema. If you have COPD, your lung function will probably never return to normal, but there are measures you can take to improve lung function and make yourself feel better. CAUSES   Smoking (common).  Exposure to secondhand smoke.  Genetic problems.  Chronic inflammatory lung diseases or recurrent infections. SYMPTOMS  Shortness of breath, especially with physical activity.  Deep, persistent (chronic) cough with a large amount of thick mucus.  Wheezing.  Rapid breaths (tachypnea).  Gray or bluish discoloration (cyanosis) of the skin, especially in your fingers, toes, or lips.  Fatigue.  Weight loss.  Frequent infections or episodes when breathing symptoms become much worse (exacerbations).  Chest tightness. DIAGNOSIS Your health care provider will take a medical history and perform a physical examination to diagnose COPD. Additional tests for COPD may include:  Lung (pulmonary) function tests.  Chest X-ray.  CT scan.  Blood tests. TREATMENT  Treatment for COPD may include:  Inhaler and nebulizer medicines. These help manage the symptoms of COPD and make your breathing more comfortable.  Supplemental oxygen. Supplemental oxygen is only helpful if you have a low oxygen level in your blood.  Exercise and physical activity. These are beneficial for nearly all people with COPD.  Lung surgery or transplant.  Nutrition therapy to gain weight, if you are underweight.  Pulmonary rehabilitation.  This may involve working with a team of health care providers and specialists, such as respiratory, occupational, and physical therapists. HOME CARE INSTRUCTIONS  Take all medicines (inhaled or pills) as directed by your health care provider.  Avoid over-the-counter medicines or cough syrups that dry up your airway (such as antihistamines) and slow down the elimination of secretions unless instructed otherwise by your health care provider.  If you are a smoker, the most important thing that you can do is stop smoking. Continuing to smoke will cause further lung damage and breathing trouble. Ask your health care provider for help with quitting smoking. He or she can direct you to community resources or hospitals that provide support.  Avoid exposure to irritants such as smoke, chemicals, and fumes that aggravate your breathing.  Use oxygen therapy and pulmonary rehabilitation if directed by your health care provider. If you require home oxygen therapy, ask your health care provider whether you should purchase a pulse oximeter to measure your oxygen level at home.  Avoid contact with individuals who have a contagious illness.  Avoid extreme temperature and humidity changes.  Eat healthy foods. Eating smaller, more frequent meals and resting before meals may help you maintain your strength.  Stay active, but balance activity with periods of rest. Exercise and physical activity will help you maintain your ability to do things you want to do.  Preventing infection and hospitalization is very important when you have COPD. Make sure to receive all the vaccines your health care provider recommends, especially the pneumococcal and influenza vaccines. Ask your health care provider whether you need a pneumonia vaccine.  Learn and use relaxation techniques to manage stress.  Learn and use controlled breathing techniques as directed by your health care  provider. Controlled breathing techniques  include:  Pursed lip breathing. Start by breathing in (inhaling) through your nose for 1 second. Then, purse your lips as if you were going to whistle and breathe out (exhale) through the pursed lips for 2 seconds.  Diaphragmatic breathing. Start by putting one hand on your abdomen just above your waist. Inhale slowly through your nose. The hand on your abdomen should move out. Then purse your lips and exhale slowly. You should be able to feel the hand on your abdomen moving in as you exhale.  Learn and use controlled coughing to clear mucus from your lungs. Controlled coughing is a series of short, progressive coughs. The steps of controlled coughing are: 1. Lean your head slightly forward. 2. Breathe in deeply using diaphragmatic breathing. 3. Try to hold your breath for 3 seconds. 4. Keep your mouth slightly open while coughing twice. 5. Spit any mucus out into a tissue. 6. Rest and repeat the steps once or twice as needed. SEEK MEDICAL CARE IF:  You are coughing up more mucus than usual.  There is a change in the color or thickness of your mucus.  Your breathing is more labored than usual.  Your breathing is faster than usual. SEEK IMMEDIATE MEDICAL CARE IF:  You have shortness of breath while you are resting.  You have shortness of breath that prevents you from:  Being able to talk.  Performing your usual physical activities.  You have chest pain lasting longer than 5 minutes.  Your skin color is more cyanotic than usual.  You measure low oxygen saturations for longer than 5 minutes with a pulse oximeter. MAKE SURE YOU:  Understand these instructions.  Will watch your condition.  Will get help right away if you are not doing well or get worse.   This information is not intended to replace advice given to you by your health care provider. Make sure you discuss any questions you have with your health care provider.   Document Released: 04/22/2005 Document Revised:  08/03/2014 Document Reviewed: 03/09/2013 Elsevier Interactive Patient Education [employer].

## 2015-06-25 NOTE — Addendum Note (Signed)
Addended by: Rockwell Germany on: 06/25/2015 12:12 PM   Modules accepted: Orders

## 2015-06-25 NOTE — Progress Notes (Signed)
Patient with history of COPD presents to clinic today c/o 2 weeks of chest congestion, increased sputum production and shortness of breath. Cough is productive of white sputum. Patient denies fever, recent travel or sick contact. Is taking Advair as directed. Has used Albuterol twice daily.   Is in need of a new nebulizer machine. Current nebulizer is > 72 years old.  Past Medical History  Diagnosis Date  . Emphysema   . Diabetes mellitus type 2  . Hyperlipidemia   . Cancer [facility]) breast ca  right  . Anxiety   . Panic attacks   . Depression   . Arthritis of both knees 10/01/2013  . Benign paroxysmal positional vertigo 10/01/2013  . Neck pain 10/01/2013  . Esophageal reflux 10/01/2013  . Pedal edema 12/10/2013  . Overactive bladder 12/10/2013  . Tobacco abuse disorder 02/01/2014  . COPD (chronic obstructive pulmonary disease) ([place]) 10/01/2013  . Serum ammonia increased ([place]) 11/10/2014  . Hyperlipidemia, mixed   . Increased ammonia level 11/25/2014  . Diarrhea 12/30/2014  . Neuropathy (HCC)     feet   . Right knee pain   . Encephalopathy, hepatic ([place]) 06/07/2014  . Abnormal finding on liver function     Current Outpatient Prescriptions on File Prior to Visit  Medication Sig Dispense Refill  . albuterol (PROVENTIL HFA;VENTOLIN HFA) 108 (90 BASE) MCG/ACT inhaler Inhale 2 puffs into the lungs every 6 (six) hours as needed for wheezing or shortness of breath. Only dispense Ventolin (Patient taking differently: Inhale 2 puffs into the lungs every 6 (six) hours as needed for wheezing or shortness of breath. Only dispense ProAir) 1 Inhaler 3  . albuterol (PROVENTIL) (2.5 MG/3ML) 0.083% nebulizer solution Inhale one vial via nebulizer four times daily as needed.  Dx code 496 360 mL 5  . Cholecalciferol (VITAMIN D3) 2000 UNITS TABS Take 1 capsule by mouth daily.     . diazepam (VALIUM) 5 MG tablet Take 0.5 tablets (2.5 mg total) by mouth daily as needed for anxiety or muscle spasms. Takes 1/2-1 tablets  as needed for anxiety 30 tablet 3  . Fluticasone-Salmeterol (ADVAIR DISKUS) 250-50 MCG/DOSE AEPB Inhale 1 puff into the lungs 2 (two) times daily. 2 each 0  . furosemide (LASIX) 20 MG tablet Take 1 tablet (20 mg total) by mouth every Monday, Wednesday, and Friday. (Patient taking differently: Take 20 mg by mouth daily as needed. Pt uses 2 times weekly) 30 tablet 3  . glucose blood test strip Use as directed twice daily to check blood sugar.  Diagnosis code E11.9 100 each 6  . Insulin Detemir (LEVEMIR FLEXPEN) 100 UNIT/ML Pen Inject 10 Units into the skin daily at 10 pm. (Patient taking differently: Inject 14 Units into the skin daily at 10 pm. ) 15 mL 1  . Insulin Pen Needle (PEN NEEDLES) 31G X 6 MM MISC Use as directed with Levemir flexpen. 50 each 6  . lactulose (CHRONULAC) 10 GM/15ML solution Take 45 mLs (30 g total) by mouth 2 (two) times daily as needed for mild constipation. 1892 mL 1  . omeprazole (PRILOSEC) 20 MG capsule TAKE 1 CAPSULE(20 MG) BY MOUTH DAILY 30 capsule 0  . Fluticasone-Salmeterol (ADVAIR DISKUS) 500-50 MCG/DOSE AEPB Inhale 1 puff into the lungs 2 (two) times daily. (Patient not taking: Reported on 06/25/2015) 2 each 0   No current facility-administered medications on file prior to visit.    Allergies  Allergen Reactions  . Citalopram     Confusion, irregular heart beat.  Marland Kitchen  Erythromycin     Stomach cramps  . Glimepiride     Elevated ammonia levels  . Versed [Midazolam] Other (See Comments)    Patient stayed confusion stayed 4+days     Family History  Problem Relation Age of Onset  . Heart failure Father   . COPD Father   . Arthritis Father 36  . Pneumonia Sister   . Breast cancer    . Stroke Mother   . Arthritis Mother 18  . Hyperlipidemia Mother   . Hypertension Mother   . Diabetes Mother   . Breast cancer Maternal Aunt   . Alcohol abuse Maternal Uncle     Social History   Social History  . Marital Status: Single    Spouse Name: N/A  . Number of  Children: 0  . Years of Education: N/A   Occupational History  . retire    Social History Main Topics  . Smoking status: Current Some Day Smoker -- 0.50 packs/day for 58 years    Types: Cigarettes    Start date: 07/27/1964  . Smokeless tobacco: Never Used     Comment: 1 pack per week  . Alcohol Use: No  . Drug Use: No  . Sexual Activity: No   Other Topics Concern  . None   Social History Narrative   Review of Systems - See HPI.  All other ROS are negative.  BP 105/43 mmHg  Pulse 71  Temp(Src) 97.8 F (36.6 C) (Oral)  Resp 18  Ht 5\' 7"  (1.702 m)  Wt 153 lb 4 oz (69.514 kg)  BMI 24.00 kg/m2  SpO2 96%  Physical Exam  Constitutional: She is oriented to person, place, and time and well-developed, well-nourished, and in no distress.  HENT:  Head: Normocephalic and atraumatic.  Right Ear: External ear normal.  Left Ear: External ear normal.  Nose: Nose normal.  Mouth/Throat: Oropharynx is clear and moist. No oropharyngeal exudate.  TM within normal limits bilaterally.  Eyes: Conjunctivae are normal. Pupils are equal, round, and reactive to light.  Neck: Neck supple.  Cardiovascular: Normal rate, regular rhythm, normal heart sounds and intact distal pulses.   Pulmonary/Chest: Effort normal. No respiratory distress. She has wheezes. She has no rales. She exhibits no tenderness.  Neurological: She is alert and oriented to person, place, and time.  Skin: Skin is warm and dry. No rash noted.  Psychiatric: Affect normal.  Vitals reviewed.  Recent Results (from the past 2160 hour(s))  Alpha-1-Antitrypsin     Status: None   Collection Time: 04/30/15  3:06 PM  Result Value Ref Range   A-1 Antitrypsin, Ser 178 83 - 199 mg/dL    Assessment/Plan: COPD with exacerbation ([place]) Neb given x 1. Improvement in wheeze. Resume all COPD medications as directed. Will order new home neb machine. Rx prednisone 40 mg x 5 days. Restart Mucinex.

## 2015-06-27 ENCOUNTER — Encounter: Payer: Self-pay | Admitting: Gastroenterology

## 2015-06-27 ENCOUNTER — Telehealth: Payer: Self-pay | Admitting: *Deleted

## 2015-06-27 ENCOUNTER — Ambulatory Visit (INDEPENDENT_AMBULATORY_CARE_PROVIDER_SITE_OTHER): Payer: Medicare HMO | Admitting: Gastroenterology

## 2015-06-27 VITALS — BP 130/70 | HR 72 | Ht 67.0 in | Wt 165.0 lb

## 2015-06-27 DIAGNOSIS — K7682 Hepatic encephalopathy: Secondary | ICD-10-CM

## 2015-06-27 DIAGNOSIS — K7290 Hepatic failure, unspecified without coma: Secondary | ICD-10-CM | POA: Diagnosis not present

## 2015-06-27 DIAGNOSIS — K7581 Nonalcoholic steatohepatitis (NASH): Secondary | ICD-10-CM

## 2015-06-27 DIAGNOSIS — R600 Localized edema: Secondary | ICD-10-CM

## 2015-06-27 DIAGNOSIS — K7469 Other cirrhosis of liver: Secondary | ICD-10-CM

## 2015-06-27 DIAGNOSIS — K7460 Unspecified cirrhosis of liver: Secondary | ICD-10-CM

## 2015-06-27 MED ORDER — SPIRONOLACTONE 25 MG PO TABS: 25.0000 mg | ORAL_TABLET | Freq: Every day | ORAL | Status: DC

## 2015-06-27 MED ORDER — FUROSEMIDE 20 MG PO TABS: ORAL_TABLET | ORAL | Status: DC

## 2015-06-27 NOTE — Progress Notes (Signed)
Ashley Savage    [id number]    [birth year]  Primary Care Physician:BLYTH, Erline Levine, MD  Referring Physician: Mosie Lukes, MD [address] [place], [place] [address]  Chief complaint:  NASH cirrhosis  HPI: 74 year old female with history of diabetes, recently diagnosed with [name] cirrhosis here to establish care. 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 Denies any abdominal distention or significant weight gain. She is on Lasix 20 mg 1-2 times a week for ankle edema. Denies any melena or blood in stool. No nausea, vomiting, dysphagia, odynophagia or abdominal pain She does not drink alcohol and does not take any herbal supplements. She takes occasional naproxen for knee pain. She has not had EGD for variceal screening. She underwent abdominal ultrasound and CT abdomen and pelvis in July 2016 which did not show any focal liver lesions.    Outpatient Encounter Prescriptions as of 06/27/2015  Medication Sig  . albuterol (PROVENTIL HFA;VENTOLIN HFA) 108 (90 BASE) MCG/ACT inhaler Inhale 2 puffs into the lungs every 6 (six) hours as needed for wheezing or shortness of breath. Only dispense Ventolin (Patient taking differently: Inhale 2 puffs into the lungs every 6 (six) hours as needed for wheezing or shortness of breath. Only dispense ProAir)  . albuterol (PROVENTIL) (2.5 MG/3ML) 0.083% nebulizer solution Inhale one vial via nebulizer four times daily as needed.  Dx code 49  . Cholecalciferol (VITAMIN D3) 2000 UNITS TABS Take 1 capsule by mouth daily.   . diazepam (VALIUM) 5 MG tablet Take 0.5 tablets (2.5 mg total) by mouth daily as needed for anxiety or muscle spasms. Takes 1/2-1 tablets as needed for anxiety  . Fluticasone-Salmeterol (ADVAIR DISKUS) 250-50 MCG/DOSE AEPB Inhale 1 puff into the lungs 2 (two) times daily.  . Fluticasone-Salmeterol (ADVAIR  DISKUS) 500-50 MCG/DOSE AEPB Inhale 1 puff into the lungs 2 (two) times daily.  Marland Kitchen glucose blood test strip Use as directed twice daily to check blood sugar.  Diagnosis code E11.9  . Insulin Detemir (LEVEMIR FLEXPEN) 100 UNIT/ML Pen Inject 10 Units into the skin daily at 10 pm. (Patient taking differently: Inject 14 Units into the skin daily at 10 pm. )  . Insulin Pen Needle (PEN NEEDLES) 31G X 6 MM MISC Use as directed with Levemir flexpen.  . lactulose (CHRONULAC) 10 GM/15ML solution Take 45 mLs (30 g total) by mouth 2 (two) times daily as needed for mild constipation.  Marland Kitchen omeprazole (PRILOSEC) 20 MG capsule TAKE 1 CAPSULE(20 MG) BY MOUTH DAILY  . predniSONE (DELTASONE) 20 MG tablet Take 2 tablets (40 mg total) by mouth daily with breakfast.  . [DISCONTINUED] furosemide (LASIX) 20 MG tablet Take 1 tablet (20 mg total) by mouth every Monday, Wednesday, and Friday. (Patient taking differently: Take 20 mg by mouth daily as needed. Pt uses 2 times weekly)  . furosemide (LASIX) 20 MG tablet Take 1/2 tablet daily  . spironolactone (ALDACTONE) 25 MG tablet Take 1 tablet (25 mg total) by mouth daily.   No facility-administered encounter medications on file as of 06/27/2015.    Allergies as of 06/27/2015 - Review Complete 06/27/2015  Allergen Reaction Noted  . Citalopram  04/22/2012  . Erythromycin  02/26/2011  . Glimepiride  02/03/2015  . Versed [midazolam] Other (See Comments) 10/25/2014    Past Medical History  Diagnosis Date  . Emphysema   .  Diabetes mellitus type 2  . Hyperlipidemia   . Cancer [facility]) breast ca  right  . Anxiety   . Panic attacks   . Depression   . Arthritis of both knees 10/01/2013  . Benign paroxysmal positional vertigo 10/01/2013  . Neck pain 10/01/2013  . Esophageal reflux 10/01/2013  . Pedal edema 12/10/2013  . Overactive bladder 12/10/2013  . Tobacco abuse disorder 02/01/2014  . COPD (chronic obstructive pulmonary disease) ([place]) 10/01/2013  . Serum ammonia increased ([place])  11/10/2014  . Hyperlipidemia, mixed   . Increased ammonia level 11/25/2014  . Diarrhea 12/30/2014  . Neuropathy (HCC)     feet   . Right knee pain   . Encephalopathy, hepatic ([place]) 06/07/2014  . Abnormal finding on liver function     Past Surgical History  Procedure Laterality Date  . Gallbladder surgery  1992  . Breast surgery  2009 right  . Appendectomy  2007  . Knee surgery    . Mandible fracture surgery    . Pilonidal cyst excision    . Tonsillectomy    . Cataract extraction      x 2    Family History  Problem Relation Age of Onset  . Heart failure Father   . COPD Father   . Arthritis Father 42  . Pneumonia Sister   . Breast cancer    . Stroke Mother   . Arthritis Mother 74  . Hyperlipidemia Mother   . Hypertension Mother   . Diabetes Mother   . Breast cancer Maternal Aunt   . Alcohol abuse Maternal Uncle     Social History   Social History  . Marital Status: Single    Spouse Name: N/A  . Number of Children: 0  . Years of Education: N/A   Occupational History  . retired    Social History Main Topics  . Smoking status: Current Some Day Smoker -- 0.50 packs/day for 58 years    Types: Cigarettes    Start date: 07/27/1964  . Smokeless tobacco: Never Used     Comment: 1 pack per week  . Alcohol Use: No  . Drug Use: No  . Sexual Activity: No   Other Topics Concern  . Not on file   Social History Narrative      Review of systems: Review of Systems  Constitutional: Negative for fever and chills.  HENT: Negative.   Eyes: Negative for blurred vision.  Respiratory: Negative for cough, shortness of breath and wheezing.   Cardiovascular: Negative for chest pain and palpitations.  Gastrointestinal: as per HPI Genitourinary: Negative for dysuria, urgency, frequency and hematuria.  Musculoskeletal: Negative for myalgias, back pain and joint pain.  Skin: Negative for itching and rash.  Neurological: Negative for dizziness, tremors, focal weakness,  seizures and loss of consciousness.  Endo/Heme/Allergies: Negative for environmental allergies.  Psychiatric/Behavioral: Negative for depression, suicidal ideas and hallucinations.  All other systems reviewed and are negative.   Physical Exam: Filed Vitals:   06/27/15 0829  BP: 130/70  Pulse: 72   Gen:      No acute distress HEENT:  EOMI, sclera anicteric Neck:     No masses; no thyromegaly Lungs:    Clear to auscultation bilaterally; normal respiratory effort CV:         Regular rate and rhythm; no murmurs Abd:      + bowel sounds; soft, non-tender; no palpable masses, no distension Ext:    2+ edema; adequate peripheral perfusion Skin:  Warm and dry; no rash Neuro: alert and oriented x 3 Psych: normal mood and affect  Data Reviewed:  CT abd & pelvis 01/2015 The lung bases are clear. The liver enhances with no focal abnormality and no ductal dilatation is seen. The liver is slightly low in attenuation and correlation with LFTs is recommended. The margins of the liver may be minimally irregular. Is there any suspicion of cirrhosis? Surgical clips are present from prior cholecystectomy. The pancreas is normal in size and the pancreatic duct is not dilated. The adrenal glands and spleen are unremarkable. The stomach is largely decompressed and cannot be evaluated. The kidneys enhance the kidneys enhance with no calculus or mass and on delayed images the pelvocaliceal systems are unremarkable and the proximal ureters are normal in caliber. Moderate atherosclerotic disease is noted involving the abdominal aorta. 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 On the ultrasound of 10/17/2014, this aneurysm measured 3.3 x 2.9 cm and therefore is stable since that exam. This aneurysm is bilobular, enlarging again just above the bifurcation to a diameter of 3.3 cm, and terminating at the bifurcation.  The uterus is somewhat lobular,  most likely due to small uterine fibroids. No adnexal lesion is seen. The urinary bladder is not well distended but no abnormality is noted. Scattered rectosigmoid colon diverticula are present. The terminal ileum is unremarkable. On axial image number 46 there may be a transient intussusception of small bowel on the right lower quadrant, of doubtful significance. Degenerative disc disease is noted primarily at L5-S1.   Assessment and Plan/Recommendations: 74 year old female with history of diabetes and [name] cirrhosis complicated by hepatic encephalopathy is here for new patient visit Peripheral edema: Start low dose diuretics lasix 10mg  daily and Spiranolactone 25mg  daily ;check CMP in 1 week Hepatic encephalopathy: Stable. Cont Lactulose Schedule for EGD for esophageal varices screening [place] screening due in 2017 Return in 3 months  K. Denzil Magnuson , MD [phone] Mon-Fri 8a-5p [phone] after 5p, weekends, holidays

## 2015-06-27 NOTE — Patient Instructions (Addendum)
You have been scheduled for an endoscopy. Please follow written instructions given to you at your visit today. If you use inhalers (even only as needed), please bring them with you on the day of your procedure. Your physician has requested that you go to www.startemmi.com and enter the access code given to you at your visit today. This web site gives a general overview about your procedure. However, you should still follow specific instructions given to you by our office regarding your preparation for the procedure.  Go to the basement for labs in 1 week   Per your request I have printed off your orders for you to have them drawn at [facility] We will send in your prescriptions Lasix and Aldactone to your pharmacy today  Follow up after Endoscopy on 08-28-2015 Wed at 8:45am

## 2015-06-27 NOTE — Telephone Encounter (Signed)
Nebulizer and albuterol orders faxed to [facility] along with OV note from 06/25/2015, demos and insurance successfully to [phone]. Order sent for scanning. JG//CMA

## 2015-06-28 ENCOUNTER — Telehealth: Payer: Self-pay | Admitting: Family Medicine

## 2015-06-28 DIAGNOSIS — G9340 Encephalopathy, unspecified: Secondary | ICD-10-CM

## 2015-06-28 DIAGNOSIS — D696 Thrombocytopenia, unspecified: Secondary | ICD-10-CM

## 2015-06-28 DIAGNOSIS — R109 Unspecified abdominal pain: Secondary | ICD-10-CM

## 2015-06-28 DIAGNOSIS — E7220 Disorder of urea cycle metabolism, unspecified: Secondary | ICD-10-CM

## 2015-06-28 DIAGNOSIS — E119 Type 2 diabetes mellitus without complications: Secondary | ICD-10-CM

## 2015-06-28 MED ORDER — LACTULOSE 10 GM/15ML PO SOLN: 30.0000 g | Freq: Two times a day (BID) | ORAL | Status: DC | PRN

## 2015-06-28 NOTE — Telephone Encounter (Signed)
Caller name: Self   Can be reached: [phone]  Pharmacy:  [id number] - [place], [place] - [address] AT [place] [phone] (Phone) [phone] (Fax)         Reason for call: Request refill on lactulose (CHRONULAC) 10 GM/15ML solution [id number]   FYI: Patient wants an order for an A1C

## 2015-06-28 NOTE — Telephone Encounter (Signed)
OK to order hgba1c for DM 2 and CBC for thrombocytopenia

## 2015-06-28 NOTE — Telephone Encounter (Signed)
Patient would like her a1c checked.  Let me know I can order and contact the patient.

## 2015-06-28 NOTE — Telephone Encounter (Signed)
Labs ordered, prescription filled. Called the patient to inform had to leave a message.

## 2015-07-01 ENCOUNTER — Encounter: Payer: Self-pay | Admitting: Gastroenterology

## 2015-07-01 ENCOUNTER — Ambulatory Visit (AMBULATORY_SURGERY_CENTER): Payer: Medicare HMO | Admitting: Gastroenterology

## 2015-07-01 ENCOUNTER — Telehealth: Payer: Self-pay | Admitting: Family Medicine

## 2015-07-01 VITALS — BP 111/57 | HR 74 | Temp 97.2°F | Resp 22 | Ht 67.0 in | Wt 165.0 lb

## 2015-07-01 DIAGNOSIS — Q394 Esophageal web: Secondary | ICD-10-CM | POA: Diagnosis not present

## 2015-07-01 DIAGNOSIS — I8500 Esophageal varices without bleeding: Secondary | ICD-10-CM | POA: Diagnosis not present

## 2015-07-01 DIAGNOSIS — K7460 Unspecified cirrhosis of liver: Secondary | ICD-10-CM

## 2015-07-01 LAB — GLUCOSE, CAPILLARY
GLUCOSE-CAPILLARY: 132 mg/dL — AB (ref 65–99)
Glucose-Capillary: 121 mg/dL — ABNORMAL HIGH (ref 65–99)

## 2015-07-01 MED ORDER — SODIUM CHLORIDE 0.9 % IV SOLN: 500.0000 mL | INTRAVENOUS | Status: DC

## 2015-07-01 NOTE — Progress Notes (Signed)
Report to PACU, RN, vss, BBS= Clear.  

## 2015-07-01 NOTE — Patient Instructions (Signed)
YOU HAD AN ENDOSCOPIC PROCEDURE TODAY AT [facility]:   Refer to the procedure report that was given to you for any specific questions about what was found during the examination.  If the procedure report does not answer your questions, please call your gastroenterologist to clarify.  If you requested that your care partner not be given the details of your procedure findings, then the procedure report has been included in a sealed envelope for you to review at your convenience later.  YOU SHOULD EXPECT: Please Note:  You might notice some irritation and congestion in your nose or some drainage.  This is from the oxygen used during your procedure.  There is no need for concern and it should clear up in a day or so.  SYMPTOMS TO REPORT IMMEDIATELY:    Following upper endoscopy (EGD)  Vomiting of blood or coffee ground material  New chest pain or pain under the shoulder blades  Painful or persistently difficult swallowing  New shortness of breath  Fever of 100F or higher  Black, tarry-looking stools  For urgent or emergent issues, a gastroenterologist can be reached at any hour by calling [phone].   DIET: Your first meal following the procedure should be a small meal and then it is ok to progress to your normal diet. Heavy or fried foods are harder to digest and may make you feel nauseous or bloated.  Likewise, meals heavy in dairy and vegetables can increase bloating.  Drink plenty of fluids but you should avoid alcoholic beverages for 24 hours.  ACTIVITY:  You should plan to take it easy for the rest of today and you should NOT DRIVE or use heavy machinery until tomorrow (because of the sedation medicines used during the test).    FOLLOW UP: Our staff will call the number listed on your records the next business day following your procedure to check on you and address any questions or concerns that you may have regarding the information given to you following your  procedure. If we do not reach you, we will leave a message.  However, if you are feeling well and you are not experiencing any problems, there is no need to return our call.  We will assume that you have returned to your regular daily activities without incident.  If any biopsies were taken you will be contacted by phone or by letter within the next 1-3 weeks.  Please call us at [phone] if you have not heard about the biopsies in 3 weeks.    SIGNATURES/CONFIDENTIALITY: You and/or your care partner have signed paperwork which will be entered into your electronic medical record.  These signatures attest to the fact that that the information above on your After Visit Summary has been reviewed and is understood.  Full responsibility of the confidentiality of this discharge information lies with you and/or your care-partner.

## 2015-07-01 NOTE — Telephone Encounter (Signed)
Scheduled lab appt. For this patient.   '

## 2015-07-01 NOTE — Op Note (Signed)
[place]  [address] [place], [address]   ENDOSCOPY PROCEDURE REPORT  PATIENT: Ashley, Savage  MR#: [id number] BIRTHDATE: September 12, 1940 , 74  yrs. old GENDER: female ENDOSCOPIST: Harl Bowie, MD REFERRED BY:  Willette Alma, M.D. PROCEDURE DATE:  07/01/2015 PROCEDURE:  EGD, diagnostic ASA CLASS:     Class III INDICATIONS:  screening for varices. MEDICATIONS: Propofol 100 mg IV TOPICAL ANESTHETIC: none  DESCRIPTION OF PROCEDURE: After the risks benefits and alternatives of the procedure were thoroughly explained, informed consent was obtained.  The [id number] endoscope was introduced through the mouth and advanced to the second portion of the duodenum , Without limitations.  The instrument was slowly withdrawn as the mucosa was fully examined.    2 columns of small grade 1 esophageal varices.  Non-obstructive Schatzki's ring at the squamocolumnar junction , 40 cm from incisors.  Small sliding hiatal hernia.  Mild portal hypertensive gastropathy.  Duodenum appeared normal.  Retroflexed views revealed as previously described.     The scope was then withdrawn from the patient and the procedure completed.  COMPLICATIONS: There were no immediate complications.  ENDOSCOPIC IMPRESSION: 2 columns of small grade 1 esophageal varices. Non-obstructive Schatzki's ring Mild portal hypertensive gastropathy.  RECOMMENDATIONS: Repeat EGD in 1-2 years for surveillance of esophageal varices   REPEAT EXAM: 1-2 years  eSigned:  Harl Bowie, MD 07/01/2015 9:56 AM

## 2015-07-01 NOTE — Telephone Encounter (Signed)
Pt states that she is returning your call.    CB: [phone]

## 2015-07-02 ENCOUNTER — Telehealth: Payer: Self-pay | Admitting: Emergency Medicine

## 2015-07-02 NOTE — Telephone Encounter (Signed)
  Follow up Call-  Call back number 07/01/2015  Post procedure Call Back phone  # [phone]  Permission to leave phone message Yes     Patient questions:  Do you have a fever, pain , or abdominal swelling? No. Pain Score  0 *  Have you tolerated food without any problems? Yes.    Have you been able to return to your normal activities? Yes.    Do you have any questions about your discharge instructions: Diet   No. Medications  No. Follow up visit  No.  Do you have questions or concerns about your Care? No.  Actions: * If pain score is 4 or above: No action needed, pain <4.

## 2015-07-03 ENCOUNTER — Other Ambulatory Visit (INDEPENDENT_AMBULATORY_CARE_PROVIDER_SITE_OTHER): Payer: Medicare HMO

## 2015-07-03 DIAGNOSIS — D696 Thrombocytopenia, unspecified: Secondary | ICD-10-CM

## 2015-07-03 DIAGNOSIS — E119 Type 2 diabetes mellitus without complications: Secondary | ICD-10-CM | POA: Diagnosis not present

## 2015-07-03 DIAGNOSIS — K7469 Other cirrhosis of liver: Secondary | ICD-10-CM

## 2015-07-03 LAB — CBC WITH DIFFERENTIAL/PLATELET
BASOS PCT: 0.5 % (ref 0.0–3.0)
Basophils Absolute: 0 10*3/uL (ref 0.0–0.1)
EOS ABS: 0.1 10*3/uL (ref 0.0–0.7)
Eosinophils Relative: 1.3 % (ref 0.0–5.0)
HCT: 38.5 % (ref 36.0–46.0)
Hemoglobin: 12.4 g/dL (ref 12.0–15.0)
LYMPHS ABS: 0.9 10*3/uL (ref 0.7–4.0)
Lymphocytes Relative: 21.2 % (ref 12.0–46.0)
MCHC: 32 g/dL (ref 30.0–36.0)
MCV: 94.5 fl (ref 78.0–100.0)
MONO ABS: 0.8 10*3/uL (ref 0.1–1.0)
Monocytes Relative: 18.8 % — ABNORMAL HIGH (ref 3.0–12.0)
NEUTROS ABS: 2.5 10*3/uL (ref 1.4–7.7)
NEUTROS PCT: 58.2 % (ref 43.0–77.0)
PLATELETS: 95 10*3/uL — AB (ref 150.0–400.0)
RBC: 4.08 Mil/uL (ref 3.87–5.11)
RDW: 16 % — AB (ref 11.5–15.5)
WBC: 4.4 10*3/uL (ref 4.0–10.5)

## 2015-07-03 LAB — HEMOGLOBIN A1C: Hgb A1c MFr Bld: 6.5 % (ref 4.6–6.5)

## 2015-07-03 NOTE — Addendum Note (Signed)
Addended by: Peggyann Shoals on: 07/03/2015 11:12 AM   Modules accepted: Orders

## 2015-07-04 ENCOUNTER — Other Ambulatory Visit: Payer: Self-pay | Admitting: Family Medicine

## 2015-07-15 ENCOUNTER — Other Ambulatory Visit: Payer: Self-pay | Admitting: Nurse Practitioner

## 2015-07-15 DIAGNOSIS — K751 Phlebitis of portal vein: Secondary | ICD-10-CM

## 2015-07-17 ENCOUNTER — Telehealth: Payer: Self-pay | Admitting: *Deleted

## 2015-07-17 NOTE — Telephone Encounter (Signed)
Received plan of Tx form from [facility]; called the office to find out specifically what need to be completed on form, as provider is requesting only a Nebulizer machine for patient. [facility] stated that they only needed provider to sign & date; completed and faxed to [employer]/[facility]

## 2015-07-24 ENCOUNTER — Encounter (HOSPITAL_BASED_OUTPATIENT_CLINIC_OR_DEPARTMENT_OTHER): Payer: Self-pay

## 2015-07-24 ENCOUNTER — Emergency Department (HOSPITAL_BASED_OUTPATIENT_CLINIC_OR_DEPARTMENT_OTHER)
Admission: EM | Admit: 2015-07-24 | Discharge: 2015-07-24 | Disposition: A | Discharge status: Home / Self Care | Payer: Medicare HMO | Attending: Emergency Medicine | Admitting: Emergency Medicine

## 2015-07-24 ENCOUNTER — Emergency Department (HOSPITAL_BASED_OUTPATIENT_CLINIC_OR_DEPARTMENT_OTHER): Payer: Medicare HMO

## 2015-07-24 DIAGNOSIS — M170 Bilateral primary osteoarthritis of knee: Secondary | ICD-10-CM | POA: Diagnosis not present

## 2015-07-24 DIAGNOSIS — E119 Type 2 diabetes mellitus without complications: Secondary | ICD-10-CM | POA: Insufficient documentation

## 2015-07-24 DIAGNOSIS — Z8659 Personal history of other mental and behavioral disorders: Secondary | ICD-10-CM | POA: Diagnosis not present

## 2015-07-24 DIAGNOSIS — Z8742 Personal history of other diseases of the female genital tract: Secondary | ICD-10-CM | POA: Diagnosis not present

## 2015-07-24 DIAGNOSIS — Z7951 Long term (current) use of inhaled steroids: Secondary | ICD-10-CM | POA: Insufficient documentation

## 2015-07-24 DIAGNOSIS — J441 Chronic obstructive pulmonary disease with (acute) exacerbation: Secondary | ICD-10-CM | POA: Insufficient documentation

## 2015-07-24 DIAGNOSIS — Z7952 Long term (current) use of systemic steroids: Secondary | ICD-10-CM | POA: Insufficient documentation

## 2015-07-24 DIAGNOSIS — K219 Gastro-esophageal reflux disease without esophagitis: Secondary | ICD-10-CM | POA: Diagnosis not present

## 2015-07-24 DIAGNOSIS — Z794 Long term (current) use of insulin: Secondary | ICD-10-CM | POA: Insufficient documentation

## 2015-07-24 DIAGNOSIS — F17210 Nicotine dependence, cigarettes, uncomplicated: Secondary | ICD-10-CM | POA: Diagnosis not present

## 2015-07-24 DIAGNOSIS — Z79899 Other long term (current) drug therapy: Secondary | ICD-10-CM | POA: Diagnosis not present

## 2015-07-24 DIAGNOSIS — Z8669 Personal history of other diseases of the nervous system and sense organs: Secondary | ICD-10-CM | POA: Insufficient documentation

## 2015-07-24 DIAGNOSIS — E785 Hyperlipidemia, unspecified: Secondary | ICD-10-CM | POA: Insufficient documentation

## 2015-07-24 DIAGNOSIS — R600 Localized edema: Secondary | ICD-10-CM | POA: Diagnosis not present

## 2015-07-24 DIAGNOSIS — R0602 Shortness of breath: Secondary | ICD-10-CM | POA: Diagnosis present

## 2015-07-24 LAB — CBC WITH DIFFERENTIAL/PLATELET
Basophils Absolute: 0 10*3/uL (ref 0.0–0.1)
Basophils Relative: 1 %
EOS ABS: 0.1 10*3/uL (ref 0.0–0.7)
EOS PCT: 3 %
HCT: 40.6 % (ref 36.0–46.0)
HEMOGLOBIN: 13.7 g/dL (ref 12.0–15.0)
LYMPHS ABS: 1.8 10*3/uL (ref 0.7–4.0)
LYMPHS PCT: 35 %
MCH: 30.2 pg (ref 26.0–34.0)
MCHC: 33.7 g/dL (ref 30.0–36.0)
MCV: 89.6 fL (ref 78.0–100.0)
MONOS PCT: 16 %
Monocytes Absolute: 0.8 10*3/uL (ref 0.1–1.0)
Neutro Abs: 2.4 10*3/uL (ref 1.7–7.7)
Neutrophils Relative %: 46 %
PLATELETS: 90 10*3/uL — AB (ref 150–400)
RBC: 4.53 MIL/uL (ref 3.87–5.11)
RDW: 14.8 % (ref 11.5–15.5)
WBC: 5.1 10*3/uL (ref 4.0–10.5)

## 2015-07-24 LAB — BASIC METABOLIC PANEL
ANION GAP: 7 (ref 5–15)
BUN: 12 mg/dL (ref 6–20)
CHLORIDE: 104 mmol/L (ref 101–111)
CO2: 28 mmol/L (ref 22–32)
CREATININE: 0.67 mg/dL (ref 0.44–1.00)
Calcium: 8.6 mg/dL — ABNORMAL LOW (ref 8.9–10.3)
GFR calc non Af Amer: >60 mL/min (ref >60)
GLUCOSE: 229 mg/dL — AB (ref 65–99)
Potassium: 3.8 mmol/L (ref 3.5–5.1)
Sodium: 139 mmol/L (ref 135–145)

## 2015-07-24 MED ORDER — AZITHROMYCIN 250 MG PO TABS: 250.0000 mg | ORAL_TABLET | Freq: Every day | ORAL | Status: DC

## 2015-07-24 MED ORDER — IPRATROPIUM-ALBUTEROL 0.5-2.5 (3) MG/3ML IN SOLN
3.0000 mL | Freq: Once | RESPIRATORY_TRACT | Status: AC
  Administered 2015-07-24: 3 mL via RESPIRATORY_TRACT
  Filled 2015-07-24: qty 3

## 2015-07-24 MED ORDER — ALBUTEROL SULFATE (2.5 MG/3ML) 0.083% IN NEBU
INHALATION_SOLUTION | RESPIRATORY_TRACT | Status: AC
  Administered 2015-07-24: 2.5 mg
  Filled 2015-07-24: qty 3

## 2015-07-24 MED ORDER — ALBUTEROL SULFATE HFA 108 (90 BASE) MCG/ACT IN AERS
2.0000 | INHALATION_SPRAY | Freq: Once | RESPIRATORY_TRACT | Status: AC
  Administered 2015-07-24: 2 via RESPIRATORY_TRACT
  Filled 2015-07-24: qty 6.7

## 2015-07-24 MED ORDER — PREDNISONE 20 MG PO TABS
40.0000 mg | ORAL_TABLET | Freq: Once | ORAL | Status: AC
  Administered 2015-07-24: 40 mg via ORAL
  Filled 2015-07-24: qty 2

## 2015-07-24 MED ORDER — METHYLPREDNISOLONE 4 MG PO TBPK: ORAL_TABLET | ORAL | Status: DC

## 2015-07-24 MED ORDER — GUAIFENESIN 100 MG/5ML PO SOLN
10.0000 mL | ORAL | Status: DC | PRN
  Administered 2015-07-24: 200 mg via ORAL
  Filled 2015-07-24: qty 10

## 2015-07-24 MED ORDER — IPRATROPIUM-ALBUTEROL 0.5-2.5 (3) MG/3ML IN SOLN
RESPIRATORY_TRACT | Status: AC
  Administered 2015-07-24: 3 mL
  Filled 2015-07-24: qty 3

## 2015-07-24 NOTE — ED Provider Notes (Signed)
CSN: [id number]     Arrival date & time 07/24/15  1320 History   First MD Initiated Contact with Patient 07/24/15 1502     Chief Complaint  Patient presents with  . Shortness of Breath     Patient is a 74 y.o. female presenting with shortness of breath. The history is provided by the patient. No language interpreter was used.  Shortness of Breath  Ashley Savage is a 74 y.o. female who presents to the Emergency Department complaining of shortness of breath. She reports 2-3 days of increased congestion in her nose and chest. She has increased shortness of breath with chest tightness and coughing. She denies any abdominal pain, vomiting. She has a history of mass with hepatic encephalopathy and takes lactulose. She has chronic lower extremity edema. She has no change in her mental status or change in her lower extremity edema. She is a half pack per day smoker but has not been smoking during this illness. She takes albuterol nebulizer treatments at home with temporary improvement in her symptoms. She is on Advair inhaler. Symptoms are moderate, constant, worsening.  Past Medical History  Diagnosis Date  . Emphysema   . Diabetes mellitus type 2  . Hyperlipidemia   . Cancer [facility]) breast ca  right  . Anxiety   . Panic attacks   . Depression   . Arthritis of both knees 10/01/2013  . Benign paroxysmal positional vertigo 10/01/2013  . Neck pain 10/01/2013  . Esophageal reflux 10/01/2013  . Pedal edema 12/10/2013  . Overactive bladder 12/10/2013  . Tobacco abuse disorder 02/01/2014  . COPD (chronic obstructive pulmonary disease) ([place]) 10/01/2013  . Serum ammonia increased ([place]) 11/10/2014  . Hyperlipidemia, mixed   . Increased ammonia level 11/25/2014  . Diarrhea 12/30/2014  . Neuropathy (HCC)     feet   . Right knee pain   . Encephalopathy, hepatic ([place]) 06/07/2014  . Abnormal finding on liver function    Past Surgical History  Procedure Laterality Date  . Gallbladder surgery  1992  . Breast surgery   2009 right  . Appendectomy  2007  . Knee surgery    . Mandible fracture surgery    . Pilonidal cyst excision    . Tonsillectomy    . Cataract extraction      x 2   Family History  Problem Relation Age of Onset  . Heart failure Father   . COPD Father   . Arthritis Father 39  . Pneumonia Sister   . Breast cancer    . Stroke Mother   . Arthritis Mother 42  . Hyperlipidemia Mother   . Hypertension Mother   . Diabetes Mother   . Breast cancer Maternal Aunt   . Alcohol abuse Maternal Uncle    Social History  Substance Use Topics  . Smoking status: Current Some Day Smoker -- 0.50 packs/day for 58 years    Types: Cigarettes    Start date: 07/27/1964  . Smokeless tobacco: Never Used     Comment: 1 pack per week  . Alcohol Use: No   OB History    No data available     Review of Systems  Respiratory: Positive for shortness of breath.   All other systems reviewed and are negative.     Allergies  Citalopram; Erythromycin; Glimepiride; and Versed  Home Medications   Prior to Admission medications   Medication Sig Start Date End Date Taking? Authorizing Provider  albuterol (PROVENTIL HFA;VENTOLIN HFA) 108 (90 BASE) MCG/ACT  inhaler Inhale 2 puffs into the lungs every 6 (six) hours as needed for wheezing or shortness of breath. Only dispense Ventolin Patient taking differently: Inhale 2 puffs into the lungs every 6 (six) hours as needed for wheezing or shortness of breath. Only dispense ProAir 09/26/13   Mosie Lukes, MD  albuterol (PROVENTIL) (2.5 MG/3ML) 0.083% nebulizer solution Inhale one vial via nebulizer four times daily as needed.  Dx code 496 01/24/15   Mosie Lukes, MD  azithromycin (ZITHROMAX) 250 MG tablet Take 1 tablet (250 mg total) by mouth daily. Take first 2 tablets together, then 1 every day until finished. 07/24/15   Quintella Reichert, MD  Cholecalciferol (VITAMIN D3) 2000 UNITS TABS Take 1 capsule by mouth daily.     Historical Provider, MD   Fluticasone-Salmeterol (ADVAIR DISKUS) 250-50 MCG/DOSE AEPB Inhale 1 puff into the lungs 2 (two) times daily. Patient taking differently: Inhale 1 puff into the lungs 2 (two) times daily. 500/50 05/23/15   Tammy S Parrett, NP  furosemide (LASIX) 20 MG tablet Take 1/2 tablet daily 06/27/15   Harl Bowie V, MD  glucose blood test strip Use as directed twice daily to check blood sugar.  Diagnosis code E11.9 11/05/14   Mosie Lukes, MD  Insulin Detemir (LEVEMIR FLEXPEN) 100 UNIT/ML Pen Inject 10 Units into the skin daily at 10 pm. Patient taking differently: Inject 14 Units into the skin daily at 10 pm.  02/11/15   Mosie Lukes, MD  Insulin Pen Needle (PEN NEEDLES) 31G X 6 MM MISC Use as directed with Levemir flexpen. 02/13/15   Mosie Lukes, MD  lactulose (CHRONULAC) 10 GM/15ML solution Take 45 mLs (30 g total) by mouth 2 (two) times daily as needed for mild constipation. 06/28/15   Mosie Lukes, MD  methylPREDNISolone (MEDROL DOSEPAK) 4 MG TBPK tablet Take according to package instructions 07/24/15   Quintella Reichert, MD  omeprazole (PRILOSEC) 20 MG capsule TAKE 1 CAPSULE(20 MG) BY MOUTH DAILY 07/04/15   Mosie Lukes, MD  predniSONE (DELTASONE) 20 MG tablet Take 2 tablets (40 mg total) by mouth daily with breakfast. 06/25/15   Brunetta Jeans, PA-C  spironolactone (ALDACTONE) 25 MG tablet Take 1 tablet (25 mg total) by mouth daily. 06/27/15   Mauri Pole, MD   BP 133/64 mmHg  Pulse 82  Temp(Src) 97.9 F (36.6 C) (Oral)  Resp 19  Ht 5\' 7"  (1.702 m)  Wt 155 lb (70.308 kg)  BMI 24.27 kg/m2  SpO2 94% Physical Exam  Constitutional: She is oriented to person, place, and time. She appears well-developed and well-nourished.  HENT:  Head: Normocephalic and atraumatic.  Cardiovascular: Normal rate and regular rhythm.   No murmur heard. Pulmonary/Chest: Effort normal. No respiratory distress.  Occasional rhonchi bilaterally  Abdominal: Soft. There is no tenderness. There is no  rebound and no guarding.  Musculoskeletal: She exhibits no tenderness.  1+ pitting edema in BLE  Neurological: She is alert and oriented to person, place, and time.  Skin: Skin is warm and dry.  Psychiatric: She has a normal mood and affect. Her behavior is normal.  Nursing note and vitals reviewed.   ED Course  Procedures (including critical care time) Labs Review Labs Reviewed  BASIC METABOLIC PANEL - Abnormal; Notable for the following:    Glucose, Bld 229 (*)    Calcium 8.6 (*)    All other components within normal limits  CBC WITH DIFFERENTIAL/PLATELET - Abnormal; Notable for the following:  Platelets 90 (*)    All other components within normal limits    Imaging Review Dg Chest 2 View  07/24/2015  CLINICAL DATA:  Shortness of breath, cough, wheezing EXAM: CHEST  2 VIEW COMPARISON:  10/15/2014 FINDINGS: Chronic interstitial markings/emphysematous changes. No focal consolidation. No pleural effusion or pneumothorax. The heart is normal in size. Visualized osseous structures are within normal limits. IMPRESSION: No evidence of acute cardiopulmonary disease. Electronically Signed   By: Julian Hy M.D.   On: 07/24/2015 13:49   I have personally reviewed and evaluated these images and lab results as part of my medical decision-making.   EKG Interpretation None      MDM   Final diagnoses:  COPD with acute exacerbation ([place])   Patient with history of COPD here for increased shortness of breath and congestion. She was evaluated following an albuterol nebulizer treatment and she has at this time without increased work of breathing. There is good air movement bilaterally with occasional rhonchi. Will provide additional breathing treatments, steroids for COPD exacerbation. Checking electrolytes.  On repeat evaluation after second nebulizer treatment patient continues to feel improved. No significant hypoxia in the department. Discussed with patient home care for COPD  exacerbation. Discussed close return precautions as well as outpatient follow-up. CBC demonstrates thrombocytopenia, this is similar to prior studies.   Quintella Reichert, MD 07/25/15 [phone]

## 2015-07-24 NOTE — Discharge Instructions (Signed)

## 2015-07-24 NOTE — ED Notes (Addendum)
Patient here with increased shortness of breath since last pm, has COPD and states that she is getting a cold, speaking single words. Tripod position and coughing constant throughout assessment

## 2015-07-25 ENCOUNTER — Telehealth: Payer: Self-pay | Admitting: Family Medicine

## 2015-07-26 ENCOUNTER — Ambulatory Visit (INDEPENDENT_AMBULATORY_CARE_PROVIDER_SITE_OTHER): Payer: Medicare HMO | Admitting: Family Medicine

## 2015-07-26 ENCOUNTER — Encounter: Payer: Self-pay | Admitting: Family Medicine

## 2015-07-26 VITALS — BP 132/62 | HR 77 | Temp 98.2°F | Wt 153.6 lb

## 2015-07-26 DIAGNOSIS — E1142 Type 2 diabetes mellitus with diabetic polyneuropathy: Secondary | ICD-10-CM

## 2015-07-26 DIAGNOSIS — J441 Chronic obstructive pulmonary disease with (acute) exacerbation: Secondary | ICD-10-CM | POA: Diagnosis not present

## 2015-07-26 MED ORDER — IPRATROPIUM-ALBUTEROL 0.5-2.5 (3) MG/3ML IN SOLN
3.0000 mL | Freq: Once | RESPIRATORY_TRACT | Status: AC
  Administered 2015-07-26: 3 mL via RESPIRATORY_TRACT

## 2015-07-26 NOTE — Patient Instructions (Signed)
Sliding scale insulin 200-250--- 2 u 251-300--- 4 u 301-350    6u 351-400   8 u >400 10 and call Dr  Chronic Obstructive Pulmonary Disease Chronic obstructive pulmonary disease (COPD) is a common lung condition in which airflow from the lungs is limited. COPD is a general term that can be used to describe many different lung problems that limit airflow, including both chronic bronchitis and emphysema. If you have COPD, your lung function will probably never return to normal, but there are measures you can take to improve lung function and make yourself feel better. CAUSES   Smoking (common).  Exposure to secondhand smoke.  Genetic problems.  Chronic inflammatory lung diseases or recurrent infections. SYMPTOMS  Shortness of breath, especially with physical activity.  Deep, persistent (chronic) cough with a large amount of thick mucus.  Wheezing.  Rapid breaths (tachypnea).  Gray or bluish discoloration (cyanosis) of the skin, especially in your fingers, toes, or lips.  Fatigue.  Weight loss.  Frequent infections or episodes when breathing symptoms become much worse (exacerbations).  Chest tightness. DIAGNOSIS Your health care provider will take a medical history and perform a physical examination to diagnose COPD. Additional tests for COPD may include:  Lung (pulmonary) function tests.  Chest X-ray.  CT scan.  Blood tests. TREATMENT  Treatment for COPD may include:  Inhaler and nebulizer medicines. These help manage the symptoms of COPD and make your breathing more comfortable.  Supplemental oxygen. Supplemental oxygen is only helpful if you have a low oxygen level in your blood.  Exercise and physical activity. These are beneficial for nearly all people with COPD.  Lung surgery or transplant.  Nutrition therapy to gain weight, if you are underweight.  Pulmonary rehabilitation. This may involve working with a team of health care providers and specialists,  such as respiratory, occupational, and physical therapists. HOME CARE INSTRUCTIONS  Take all medicines (inhaled or pills) as directed by your health care provider.  Avoid over-the-counter medicines or cough syrups that dry up your airway (such as antihistamines) and slow down the elimination of secretions unless instructed otherwise by your health care provider.  If you are a smoker, the most important thing that you can do is stop smoking. Continuing to smoke will cause further lung damage and breathing trouble. Ask your health care provider for help with quitting smoking. He or she can direct you to community resources or hospitals that provide support.  Avoid exposure to irritants such as smoke, chemicals, and fumes that aggravate your breathing.  Use oxygen therapy and pulmonary rehabilitation if directed by your health care provider. If you require home oxygen therapy, ask your health care provider whether you should purchase a pulse oximeter to measure your oxygen level at home.  Avoid contact with individuals who have a contagious illness.  Avoid extreme temperature and humidity changes.  Eat healthy foods. Eating smaller, more frequent meals and resting before meals may help you maintain your strength.  Stay active, but balance activity with periods of rest. Exercise and physical activity will help you maintain your ability to do things you want to do.  Preventing infection and hospitalization is very important when you have COPD. Make sure to receive all the vaccines your health care provider recommends, especially the pneumococcal and influenza vaccines. Ask your health care provider whether you need a pneumonia vaccine.  Learn and use relaxation techniques to manage stress.  Learn and use controlled breathing techniques as directed by your health care provider. Controlled breathing  techniques include:  Pursed lip breathing. Start by breathing in (inhaling) through your nose for  1 second. Then, purse your lips as if you were going to whistle and breathe out (exhale) through the pursed lips for 2 seconds.  Diaphragmatic breathing. Start by putting one hand on your abdomen just above your waist. Inhale slowly through your nose. The hand on your abdomen should move out. Then purse your lips and exhale slowly. You should be able to feel the hand on your abdomen moving in as you exhale.  Learn and use controlled coughing to clear mucus from your lungs. Controlled coughing is a series of short, progressive coughs. The steps of controlled coughing are: 1. Lean your head slightly forward. 2. Breathe in deeply using diaphragmatic breathing. 3. Try to hold your breath for 3 seconds. 4. Keep your mouth slightly open while coughing twice. 5. Spit any mucus out into a tissue. 6. Rest and repeat the steps once or twice as needed. SEEK MEDICAL CARE IF:  You are coughing up more mucus than usual.  There is a change in the color or thickness of your mucus.  Your breathing is more labored than usual.  Your breathing is faster than usual. SEEK IMMEDIATE MEDICAL CARE IF:  You have shortness of breath while you are resting.  You have shortness of breath that prevents you from:  Being able to talk.  Performing your usual physical activities.  You have chest pain lasting longer than 5 minutes.  Your skin color is more cyanotic than usual.  You measure low oxygen saturations for longer than 5 minutes with a pulse oximeter. MAKE SURE YOU:  Understand these instructions.  Will watch your condition.  Will get help right away if you are not doing well or get worse.   This information is not intended to replace advice given to you by your health care provider. Make sure you discuss any questions you have with your health care provider.   Document Released: 04/22/2005 Document Revised: 08/03/2014 Document Reviewed: 03/09/2013 Elsevier Interactive Patient Education [employer]

## 2015-07-26 NOTE — Telephone Encounter (Signed)
error:[id number] ° °

## 2015-07-26 NOTE — Assessment & Plan Note (Signed)
pred making blood sugars run high Reg insulin and sliding scale given to pt

## 2015-07-26 NOTE — Progress Notes (Signed)
[name], female    DOB: [birth year]  Age: 74 y.o. MRN: [id number]    Subjective:  Subjective HPI Ashley Savage presents for f/u er for copd exacerbation.  No fever.  Pt is jittery with prednisone and glucose has been very high.   As high as 400.    Review of Systems  Constitutional: Positive for chills. Negative for fever, diaphoresis, appetite change, fatigue and unexpected weight change.  HENT: Positive for congestion, rhinorrhea and sinus pressure. Negative for postnasal drip.   Eyes: Negative for pain, redness and visual disturbance.  Respiratory: Positive for cough, chest tightness, shortness of breath and wheezing.   Cardiovascular: Negative for chest pain, palpitations and leg swelling.  Endocrine: Negative for cold intolerance, heat intolerance, polydipsia, polyphagia and polyuria.  Genitourinary: Negative for dysuria, frequency and difficulty urinating.  Allergic/Immunologic: Negative for environmental allergies.  Neurological: Negative for dizziness, light-headedness, numbness and headaches.    History Past Medical History  Diagnosis Date  . Emphysema   . Diabetes mellitus type 2  . Hyperlipidemia   . Cancer [facility]) breast ca  right  . Anxiety   . Panic attacks   . Depression   . Arthritis of both knees 10/01/2013  . Benign paroxysmal positional vertigo 10/01/2013  . Neck pain 10/01/2013  . Esophageal reflux 10/01/2013  . Pedal edema 12/10/2013  . Overactive bladder 12/10/2013  . Tobacco abuse disorder 02/01/2014  . COPD (chronic obstructive pulmonary disease) ([place]) 10/01/2013  . Serum ammonia increased ([place]) 11/10/2014  . Hyperlipidemia, mixed   . Increased ammonia level 11/25/2014  . Diarrhea 12/30/2014  . Neuropathy (HCC)     feet   . Right knee pain   . Encephalopathy, hepatic ([place]) 06/07/2014  . Abnormal finding on liver function     She has past surgical history that includes Gallbladder surgery (1992); Breast surgery (2009 right); Appendectomy (2007); Knee  surgery; Mandible fracture surgery; Pilonidal cyst excision; Tonsillectomy; and Cataract extraction.   Her family history includes Alcohol abuse in her maternal uncle; Arthritis (age of onset: 52) in her father and mother; Breast cancer in her maternal aunt; COPD in her father; Diabetes in her mother; Heart failure in her father; Hyperlipidemia in her mother; Hypertension in her mother; Pneumonia in her sister; Stroke in her mother.She reports that she has been smoking Cigarettes.  She started smoking about 51 years ago. She has a 29 pack-year smoking history. She has never used smokeless tobacco. She reports that she does not drink alcohol or use illicit drugs.  Current Outpatient Prescriptions on File Prior to Visit  Medication Sig Dispense Refill  . albuterol (PROVENTIL HFA;VENTOLIN HFA) 108 (90 BASE) MCG/ACT inhaler Inhale 2 puffs into the lungs every 6 (six) hours as needed for wheezing or shortness of breath. Only dispense Ventolin (Patient taking differently: Inhale 2 puffs into the lungs every 6 (six) hours as needed for wheezing or shortness of breath. Only dispense ProAir) 1 Inhaler 3  . albuterol (PROVENTIL) (2.5 MG/3ML) 0.083% nebulizer solution Inhale one vial via nebulizer four times daily as needed.  Dx code 496 360 mL 5  . azithromycin (ZITHROMAX) 250 MG tablet Take 1 tablet (250 mg total) by mouth daily. Take first 2 tablets together, then 1 every day until finished. 6 tablet 0  . Cholecalciferol (VITAMIN D3) 2000 UNITS TABS Take 1 capsule by mouth daily.     . furosemide (LASIX) 20 MG tablet Take 1/2 tablet daily 30 tablet 1  . glucose blood test strip  Use as directed twice daily to check blood sugar.  Diagnosis code E11.9 100 each 6  . Insulin Detemir (LEVEMIR FLEXPEN) 100 UNIT/ML Pen Inject 10 Units into the skin daily at 10 pm. (Patient taking differently: Inject 14 Units into the skin daily at 10 pm. ) 15 mL 1  . Insulin Pen Needle (PEN NEEDLES) 31G X 6 MM MISC Use as directed with  Levemir flexpen. 50 each 6  . lactulose (CHRONULAC) 10 GM/15ML solution Take 45 mLs (30 g total) by mouth 2 (two) times daily as needed for mild constipation. 1892 mL 1  . methylPREDNISolone (MEDROL DOSEPAK) 4 MG TBPK tablet Take according to package instructions 21 tablet 0  . omeprazole (PRILOSEC) 20 MG capsule TAKE 1 CAPSULE(20 MG) BY MOUTH DAILY 90 capsule 3  . predniSONE (DELTASONE) 20 MG tablet Take 2 tablets (40 mg total) by mouth daily with breakfast. 10 tablet 0  . spironolactone (ALDACTONE) 25 MG tablet Take 1 tablet (25 mg total) by mouth daily. 30 tablet 2  . Fluticasone-Salmeterol (ADVAIR DISKUS) 250-50 MCG/DOSE AEPB Inhale 1 puff into the lungs 2 (two) times daily. (Patient not taking: Reported on 07/26/2015) 2 each 0   No current facility-administered medications on file prior to visit.     Objective:  Objective Physical Exam  Constitutional: She is oriented to person, place, and time. She appears well-developed and well-nourished.  HENT:  Right Ear: External ear normal.  Left Ear: External ear normal.  + PND + errythema  Eyes: Conjunctivae are normal. Right eye exhibits no discharge. Left eye exhibits no discharge.  Cardiovascular: Normal rate, regular rhythm and normal heart sounds.   No murmur heard. Pulmonary/Chest: Effort normal. No respiratory distress. She has wheezes. She has no rales. She exhibits no tenderness.  Musculoskeletal: She exhibits no edema.  Lymphadenopathy:    She has no cervical adenopathy.  Neurological: She is alert and oriented to person, place, and time.  Psychiatric: She has a normal mood and affect. Her behavior is normal. Thought content normal.   BP 132/62 mmHg  Pulse 77  Temp(Src) 98.2 F (36.8 C) (Oral)  Wt 153 lb 9.6 oz (69.673 kg)  SpO2 94% Wt Readings from Last 3 Encounters:  07/26/15 153 lb 9.6 oz (69.673 kg)  07/24/15 155 lb (70.308 kg)  07/01/15 165 lb (74.844 kg)     Lab Results  Component Value Date   WBC 5.1  07/24/2015   HGB 13.7 07/24/2015   HCT 40.6 07/24/2015   PLT 90* 07/24/2015   GLUCOSE 229* 07/24/2015   CHOL 151 03/26/2015   TRIG 74.0 03/26/2015   HDL 42.90 03/26/2015   LDLCALC 93 03/26/2015   ALT 21 03/26/2015   AST 32 03/26/2015   NA 139 07/24/2015   K 3.8 07/24/2015   CL 104 07/24/2015   CREATININE 0.67 07/24/2015   BUN 12 07/24/2015   CO2 28 07/24/2015   TSH 0.94 03/26/2015   HGBA1C 6.5 07/03/2015   MICROALBUR <0.7 03/26/2015    Dg Chest 2 View  07/24/2015  CLINICAL DATA:  Shortness of breath, cough, wheezing EXAM: CHEST  2 VIEW COMPARISON:  10/15/2014 FINDINGS: Chronic interstitial markings/emphysematous changes. No focal consolidation. No pleural effusion or pneumothorax. The heart is normal in size. Visualized osseous structures are within normal limits. IMPRESSION: No evidence of acute cardiopulmonary disease. Electronically Signed   By: Julian Hy M.D.   On: 07/24/2015 13:49     Assessment & Plan:  Plan I am having Ms. Fendt maintain her Vitamin  D3, albuterol, glucose blood, albuterol, Insulin Detemir, Pen Needles, Fluticasone-Salmeterol, predniSONE, furosemide, spironolactone, lactulose, omeprazole, methylPREDNISolone, and azithromycin. We administered ipratropium-albuterol.  Meds ordered this encounter  Medications  . ipratropium-albuterol (DUONEB) 0.5-2.5 (3) MG/3ML nebulizer solution 3 mL    Sig:     Problem List Items Addressed This Visit    Diabetes mellitus type 2, controlled ([place])    pred making blood sugars run high Reg insulin and sliding scale given to pt      COPD with exacerbation ([place])    Finish pred pack given in er [employer] abx Will send duoneb to pharmacy for pt to use in place of albuterol for now       Relevant Medications   ipratropium-albuterol (DUONEB) 0.5-2.5 (3) MG/3ML nebulizer solution 3 mL (Completed)    Other Visit Diagnoses    COPD exacerbation ([place])    -  Primary    Relevant Medications    ipratropium-albuterol  (DUONEB) 0.5-2.5 (3) MG/3ML nebulizer solution 3 mL (Completed)       Follow-up: Return in about 1 week (around 08/02/2015), or if symptoms worsen or fail to improve.  Garnet Koyanagi, DO

## 2015-07-26 NOTE — Assessment & Plan Note (Signed)
Finish pred pack given in er [employer] abx Will send duoneb to pharmacy for pt to use in place of albuterol for now

## 2015-07-26 NOTE — Progress Notes (Signed)
Pre visit review using our clinic review tool, if applicable. No additional management support is needed unless otherwise documented below in the visit note. 

## 2015-07-30 ENCOUNTER — Encounter (HOSPITAL_BASED_OUTPATIENT_CLINIC_OR_DEPARTMENT_OTHER): Payer: Self-pay | Admitting: *Deleted

## 2015-07-30 ENCOUNTER — Emergency Department (HOSPITAL_BASED_OUTPATIENT_CLINIC_OR_DEPARTMENT_OTHER): Payer: PPO

## 2015-07-30 ENCOUNTER — Inpatient Hospital Stay (HOSPITAL_BASED_OUTPATIENT_CLINIC_OR_DEPARTMENT_OTHER)
Admission: EM | Admit: 2015-07-30 | Discharge: 2015-08-01 | DRG: 189 | Disposition: A | Discharge status: Home / Self Care | Payer: PPO | Attending: Internal Medicine | Admitting: Internal Medicine

## 2015-07-30 DIAGNOSIS — E1165 Type 2 diabetes mellitus with hyperglycemia: Secondary | ICD-10-CM | POA: Diagnosis present

## 2015-07-30 DIAGNOSIS — F419 Anxiety disorder, unspecified: Secondary | ICD-10-CM

## 2015-07-30 DIAGNOSIS — R778 Other specified abnormalities of plasma proteins: Secondary | ICD-10-CM | POA: Diagnosis present

## 2015-07-30 DIAGNOSIS — F17210 Nicotine dependence, cigarettes, uncomplicated: Secondary | ICD-10-CM | POA: Diagnosis not present

## 2015-07-30 DIAGNOSIS — K219 Gastro-esophageal reflux disease without esophagitis: Secondary | ICD-10-CM | POA: Diagnosis not present

## 2015-07-30 DIAGNOSIS — K7581 Nonalcoholic steatohepatitis (NASH): Secondary | ICD-10-CM | POA: Diagnosis not present

## 2015-07-30 DIAGNOSIS — J9601 Acute respiratory failure with hypoxia: Principal | ICD-10-CM | POA: Diagnosis present

## 2015-07-30 DIAGNOSIS — D696 Thrombocytopenia, unspecified: Secondary | ICD-10-CM | POA: Diagnosis not present

## 2015-07-30 DIAGNOSIS — F329 Major depressive disorder, single episode, unspecified: Secondary | ICD-10-CM | POA: Diagnosis not present

## 2015-07-30 DIAGNOSIS — K7460 Unspecified cirrhosis of liver: Secondary | ICD-10-CM | POA: Diagnosis present

## 2015-07-30 DIAGNOSIS — F410 Panic disorder [episodic paroxysmal anxiety] without agoraphobia: Secondary | ICD-10-CM | POA: Diagnosis present

## 2015-07-30 DIAGNOSIS — J441 Chronic obstructive pulmonary disease with (acute) exacerbation: Secondary | ICD-10-CM | POA: Diagnosis present

## 2015-07-30 DIAGNOSIS — F418 Other specified anxiety disorders: Secondary | ICD-10-CM | POA: Diagnosis not present

## 2015-07-30 DIAGNOSIS — R0602 Shortness of breath: Secondary | ICD-10-CM | POA: Diagnosis not present

## 2015-07-30 DIAGNOSIS — R7989 Other specified abnormal findings of blood chemistry: Secondary | ICD-10-CM | POA: Diagnosis not present

## 2015-07-30 DIAGNOSIS — Z794 Long term (current) use of insulin: Secondary | ICD-10-CM

## 2015-07-30 DIAGNOSIS — R9439 Abnormal result of other cardiovascular function study: Secondary | ICD-10-CM | POA: Diagnosis not present

## 2015-07-30 DIAGNOSIS — R05 Cough: Secondary | ICD-10-CM | POA: Diagnosis not present

## 2015-07-30 LAB — COMPREHENSIVE METABOLIC PANEL
ALT: 30 U/L (ref 14–54)
AST: 33 U/L (ref 15–41)
Albumin: 3.4 g/dL — ABNORMAL LOW (ref 3.5–5.0)
Alkaline Phosphatase: 115 U/L (ref 38–126)
Anion gap: 8 (ref 5–15)
BUN: 16 mg/dL (ref 6–20)
CALCIUM: 9 mg/dL (ref 8.9–10.3)
CHLORIDE: 101 mmol/L (ref 101–111)
CO2: 27 mmol/L (ref 22–32)
Creatinine, Ser: 0.71 mg/dL (ref 0.44–1.00)
GLUCOSE: 186 mg/dL — AB (ref 65–99)
Potassium: 4.5 mmol/L (ref 3.5–5.1)
Sodium: 136 mmol/L (ref 135–145)
TOTAL PROTEIN: 6.2 g/dL — AB (ref 6.5–8.1)
Total Bilirubin: 2.7 mg/dL — ABNORMAL HIGH (ref 0.3–1.2)

## 2015-07-30 LAB — CBC WITH DIFFERENTIAL/PLATELET
Basophils Absolute: 0 10*3/uL (ref 0.0–0.1)
Basophils Relative: 0 %
EOS PCT: 1 %
Eosinophils Absolute: 0.1 10*3/uL (ref 0.0–0.7)
HCT: 45.4 % (ref 36.0–46.0)
Hemoglobin: 15.5 g/dL — ABNORMAL HIGH (ref 12.0–15.0)
LYMPHS ABS: 2.2 10*3/uL (ref 0.7–4.0)
LYMPHS PCT: 24 %
MCH: 30.2 pg (ref 26.0–34.0)
MCHC: 34.1 g/dL (ref 30.0–36.0)
MCV: 88.5 fL (ref 78.0–100.0)
MONO ABS: 1.4 10*3/uL — AB (ref 0.1–1.0)
Monocytes Relative: 15 %
Neutro Abs: 5.5 10*3/uL (ref 1.7–7.7)
Neutrophils Relative %: 60 %
PLATELETS: 123 10*3/uL — AB (ref 150–400)
RBC: 5.13 MIL/uL — AB (ref 3.87–5.11)
RDW: 14.9 % (ref 11.5–15.5)
WBC: 9.3 10*3/uL (ref 4.0–10.5)

## 2015-07-30 LAB — TROPONIN I: Troponin I: 0.04 ng/mL — ABNORMAL HIGH (ref <0.031)

## 2015-07-30 LAB — INFLUENZA PANEL BY PCR (TYPE A & B)
H1N1 flu by pcr: NOT DETECTED
INFLBPCR: NEGATIVE
Influenza A By PCR: NEGATIVE

## 2015-07-30 LAB — BRAIN NATRIURETIC PEPTIDE: B Natriuretic Peptide: 64.1 pg/mL (ref 0.0–100.0)

## 2015-07-30 LAB — GLUCOSE, CAPILLARY: Glucose-Capillary: 360 mg/dL — ABNORMAL HIGH (ref 65–99)

## 2015-07-30 MED ORDER — ASPIRIN 81 MG PO CHEW: 324.0000 mg | CHEWABLE_TABLET | Freq: Once | ORAL | Status: DC

## 2015-07-30 MED ORDER — SPIRONOLACTONE 25 MG PO TABS
25.0000 mg | ORAL_TABLET | Freq: Every day | ORAL | Status: DC
  Administered 2015-07-31 – 2015-08-01 (×2): 25 mg via ORAL
  Filled 2015-07-30 (×2): qty 1

## 2015-07-30 MED ORDER — SODIUM CHLORIDE 0.9 % IV BOLUS (SEPSIS)
1000.0000 mL | Freq: Once | INTRAVENOUS | Status: AC
  Administered 2015-07-30: 1000 mL via INTRAVENOUS

## 2015-07-30 MED ORDER — METHYLPREDNISOLONE SODIUM SUCC 125 MG IJ SOLR
60.0000 mg | Freq: Two times a day (BID) | INTRAMUSCULAR | Status: DC
  Administered 2015-07-30: 60 mg via INTRAVENOUS
  Filled 2015-07-30: qty 0.96

## 2015-07-30 MED ORDER — PANTOPRAZOLE SODIUM 40 MG PO TBEC
40.0000 mg | DELAYED_RELEASE_TABLET | Freq: Every day | ORAL | Status: DC
  Administered 2015-07-31 – 2015-08-01 (×2): 40 mg via ORAL
  Filled 2015-07-30 (×2): qty 1

## 2015-07-30 MED ORDER — SODIUM CHLORIDE 0.9 % IJ SOLN
3.0000 mL | Freq: Two times a day (BID) | INTRAMUSCULAR | Status: DC
  Administered 2015-07-30 – 2015-08-01 (×4): 3 mL via INTRAVENOUS

## 2015-07-30 MED ORDER — ASPIRIN 81 MG PO CHEW
CHEWABLE_TABLET | ORAL | Status: AC
  Administered 2015-07-30: 324 mg via ORAL
  Filled 2015-07-30: qty 4

## 2015-07-30 MED ORDER — AZITHROMYCIN 500 MG IV SOLR
INTRAVENOUS | Status: AC
  Filled 2015-07-30: qty 500

## 2015-07-30 MED ORDER — LACTULOSE 10 GM/15ML PO SOLN
ORAL | Status: AC
  Filled 2015-07-30: qty 30

## 2015-07-30 MED ORDER — ENOXAPARIN SODIUM 40 MG/0.4ML ~~LOC~~ SOLN
40.0000 mg | SUBCUTANEOUS | Status: DC
  Administered 2015-07-30 – 2015-07-31 (×2): 40 mg via SUBCUTANEOUS
  Filled 2015-07-30 (×2): qty 0.4

## 2015-07-30 MED ORDER — BUDESONIDE 0.5 MG/2ML IN SUSP
0.5000 mg | Freq: Two times a day (BID) | RESPIRATORY_TRACT | Status: DC
  Administered 2015-07-31 – 2015-08-01 (×3): 0.5 mg via RESPIRATORY_TRACT
  Filled 2015-07-30 (×4): qty 2

## 2015-07-30 MED ORDER — LACTULOSE 10 GM/15ML PO SOLN
30.0000 g | Freq: Once | ORAL | Status: AC
  Administered 2015-07-30: 30 g via ORAL

## 2015-07-30 MED ORDER — ASPIRIN EC 325 MG PO TBEC
325.0000 mg | DELAYED_RELEASE_TABLET | Freq: Once | ORAL | Status: DC
Start: 2015-07-30 — End: 2015-07-30

## 2015-07-30 MED ORDER — GUAIFENESIN ER 600 MG PO TB12
600.0000 mg | ORAL_TABLET | Freq: Two times a day (BID) | ORAL | Status: DC
  Administered 2015-07-30 – 2015-08-01 (×4): 600 mg via ORAL
  Filled 2015-07-30 (×4): qty 1

## 2015-07-30 MED ORDER — ASPIRIN 81 MG PO CHEW
324.0000 mg | CHEWABLE_TABLET | Freq: Once | ORAL | Status: AC
  Administered 2015-07-30: 324 mg via ORAL

## 2015-07-30 MED ORDER — INSULIN DETEMIR 100 UNIT/ML ~~LOC~~ SOLN
14.0000 [IU] | Freq: Every day | SUBCUTANEOUS | Status: DC
  Administered 2015-07-30 – 2015-07-31 (×2): 14 [IU] via SUBCUTANEOUS
  Filled 2015-07-30 (×3): qty 0.14

## 2015-07-30 MED ORDER — IPRATROPIUM-ALBUTEROL 0.5-2.5 (3) MG/3ML IN SOLN
3.0000 mL | RESPIRATORY_TRACT | Status: AC
  Administered 2015-07-30 (×3): 3 mL via RESPIRATORY_TRACT
  Filled 2015-07-30: qty 3

## 2015-07-30 MED ORDER — IPRATROPIUM-ALBUTEROL 0.5-2.5 (3) MG/3ML IN SOLN: 3.0000 mL | RESPIRATORY_TRACT | Status: DC | PRN

## 2015-07-30 MED ORDER — ONDANSETRON HCL 4 MG PO TABS: 4.0000 mg | ORAL_TABLET | Freq: Four times a day (QID) | ORAL | Status: DC | PRN

## 2015-07-30 MED ORDER — DEXTROSE 5 % IV SOLN
500.0000 mg | Freq: Once | INTRAVENOUS | Status: AC
  Administered 2015-07-30: 500 mg via INTRAVENOUS

## 2015-07-30 MED ORDER — ARFORMOTEROL TARTRATE 15 MCG/2ML IN NEBU
15.0000 ug | INHALATION_SOLUTION | Freq: Two times a day (BID) | RESPIRATORY_TRACT | Status: DC
  Administered 2015-07-31 (×2): 15 ug via RESPIRATORY_TRACT
  Filled 2015-07-30 (×7): qty 2

## 2015-07-30 MED ORDER — VITAMIN D3 25 MCG (1000 UNIT) PO TABS
2000.0000 [IU] | ORAL_TABLET | Freq: Every day | ORAL | Status: DC
  Administered 2015-07-31 – 2015-08-01 (×2): 2000 [IU] via ORAL
  Filled 2015-07-30 (×2): qty 2

## 2015-07-30 MED ORDER — DEXTROSE 5 % IV SOLN
500.0000 mg | INTRAVENOUS | Status: DC
  Filled 2015-07-30: qty 500

## 2015-07-30 MED ORDER — FUROSEMIDE 20 MG PO TABS
10.0000 mg | ORAL_TABLET | Freq: Every day | ORAL | Status: DC
  Administered 2015-07-31 – 2015-08-01 (×2): 10 mg via ORAL
  Filled 2015-07-30 (×2): qty 1

## 2015-07-30 MED ORDER — INSULIN ASPART 100 UNIT/ML ~~LOC~~ SOLN
0.0000 [IU] | SUBCUTANEOUS | Status: DC
  Administered 2015-07-30: 9 [IU] via SUBCUTANEOUS
  Administered 2015-07-31 (×2): 2 [IU] via SUBCUTANEOUS
  Administered 2015-07-31: 9 [IU] via SUBCUTANEOUS
  Administered 2015-07-31: 2 [IU] via SUBCUTANEOUS
  Administered 2015-07-31: 9 [IU] via SUBCUTANEOUS
  Administered 2015-07-31: 1 [IU] via SUBCUTANEOUS
  Administered 2015-08-01 (×3): 2 [IU] via SUBCUTANEOUS
  Administered 2015-08-01: 3 [IU] via SUBCUTANEOUS

## 2015-07-30 MED ORDER — LEVALBUTEROL HCL 0.63 MG/3ML IN NEBU
0.6300 mg | INHALATION_SOLUTION | Freq: Four times a day (QID) | RESPIRATORY_TRACT | Status: DC
  Administered 2015-07-30: 0.63 mg via RESPIRATORY_TRACT
  Filled 2015-07-30: qty 3

## 2015-07-30 MED ORDER — LACTULOSE 10 GM/15ML PO SOLN
30.0000 g | Freq: Two times a day (BID) | ORAL | Status: DC | PRN
  Administered 2015-07-31 – 2015-08-01 (×2): 30 g via ORAL
  Filled 2015-07-30 (×3): qty 45

## 2015-07-30 MED ORDER — ONDANSETRON HCL 4 MG/2ML IJ SOLN: 4.0000 mg | Freq: Four times a day (QID) | INTRAMUSCULAR | Status: DC | PRN

## 2015-07-30 MED ORDER — IPRATROPIUM BROMIDE 0.02 % IN SOLN
0.5000 mg | Freq: Four times a day (QID) | RESPIRATORY_TRACT | Status: DC
  Administered 2015-07-30: 0.5 mg via RESPIRATORY_TRACT
  Filled 2015-07-30: qty 2.5

## 2015-07-30 NOTE — Progress Notes (Signed)
Ashley Savage, Ashley Savage Female, 75 y.o., 07/17/1941  She is a 74yr female with h/o copd, was seen a few days ago for copd exacerbation, did not improve with prednisone and zithromax, she came back to [facility] due to sob, cough, wheezing, on arrival to [facility] ED she is found having sinus tachycardia, hypoxia 84% on room air. Cxr/ekg no acute findings, she is now feeling better s/p three duonebs and solumedrol, iv zithromax was given there, she is accepted to [facility] med tele.  Please call flow manager at [phone] upon patient's arrival.

## 2015-07-30 NOTE — ED Notes (Signed)
Pt to room 5 by ems, reporting that she was seen here last week for "the crud" and her copd getting worse. Pt states "they told me I was borderline, but told me I could go home and use the breathing treatments..." pt states that she used the nebulizer and never felt any better. Today her sob was much worse, so she called ems and came here.

## 2015-07-30 NOTE — ED Notes (Signed)
Had refused asa earlier because of other health reasons  But now understands why and was glad to take them

## 2015-07-30 NOTE — ED Notes (Signed)
[facility] at bedside for transport. 

## 2015-07-30 NOTE — ED Provider Notes (Signed)
CSN: [id number]     Arrival date & time 07/30/15  1229 History   First MD Initiated Contact with Patient 07/30/15 1231     Chief Complaint  Patient presents with  . Shortness of Breath     (Consider location/radiation/quality/duration/timing/severity/associated sxs/prior Treatment) Patient is a 75 y.o. female presenting with shortness of breath.  Shortness of Breath Severity:  Severe Onset quality:  Gradual Duration:  1 week Timing:  Constant Progression:  Worsening Chronicity:  Recurrent Context comment:  Came to ED last week, treated for COPD exacerbation, Associated symptoms: chest pain (bilateral "soreness" below ribs from coughing), cough and wheezing   Associated symptoms: no abdominal pain, no fever, no headaches, no neck pain, no rash, no sore throat and no vomiting     Past Medical History  Diagnosis Date  . Emphysema   . Diabetes mellitus type 2  . Hyperlipidemia   . Cancer [facility]) breast ca  right  . Anxiety   . Panic attacks   . Depression   . Arthritis of both knees 10/01/2013  . Benign paroxysmal positional vertigo 10/01/2013  . Neck pain 10/01/2013  . Esophageal reflux 10/01/2013  . Pedal edema 12/10/2013  . Overactive bladder 12/10/2013  . Tobacco abuse disorder 02/01/2014  . COPD (chronic obstructive pulmonary disease) ([place]) 10/01/2013  . Serum ammonia increased ([place]) 11/10/2014  . Hyperlipidemia, mixed   . Increased ammonia level 11/25/2014  . Diarrhea 12/30/2014  . Neuropathy (H[facility])     feet   . Right knee pain   . Encephalopathy, hepatic ([place]) 06/07/2014  . Abnormal finding on liver function    Past Surgical History  Procedure Laterality Date  . Gallbladder surgery  1992  . Breast surgery  2009 right  . Appendectomy  2007  . Knee surgery    . Mandible fracture surgery    . Pilonidal cyst excision    . Tonsillectomy    . Cataract extraction      x 2   Family History  Problem Relation Age of Onset  . Heart failure Father   . COPD Father   . Arthritis  Father 30  . Pneumonia Sister   . Breast cancer    . Stroke Mother   . Arthritis Mother 80  . Hyperlipidemia Mother   . Hypertension Mother   . Diabetes Mother   . Breast cancer Maternal Aunt   . Alcohol abuse Maternal Uncle    Social History  Substance Use Topics  . Smoking status: Current Some Day Smoker -- 0.50 packs/day for 58 years    Types: Cigarettes    Start date: 07/27/1964  . Smokeless tobacco: Never Used     Comment: 1 pack per week  . Alcohol Use: No   OB History    No data available     Review of Systems  Constitutional: Positive for fatigue. Negative for fever.  HENT: Negative for sore throat.   Eyes: Negative for visual disturbance.  Respiratory: Positive for cough, shortness of breath and wheezing.   Cardiovascular: Positive for chest pain (bilateral "soreness" below ribs from coughing).  Gastrointestinal: Negative for nausea, vomiting and abdominal pain.  Genitourinary: Negative for difficulty urinating.  Musculoskeletal: Negative for back pain and neck pain.  Skin: Negative for rash.  Neurological: Negative for syncope and headaches.      Allergies  Citalopram; Erythromycin; Glimepiride; and Versed  Home Medications   Prior to Admission medications   Medication Sig Start Date End Date Taking? Authorizing Provider  albuterol (PROVENTIL  HFA;VENTOLIN HFA) 108 (90 BASE) MCG/ACT inhaler Inhale 2 puffs into the lungs every 6 (six) hours as needed for wheezing or shortness of breath. Only dispense Ventolin Patient taking differently: Inhale 2 puffs into the lungs every 6 (six) hours as needed for wheezing or shortness of breath. Only dispense ProAir 09/26/13   Mosie Lukes, MD  albuterol (PROVENTIL) (2.5 MG/3ML) 0.083% nebulizer solution Inhale one vial via nebulizer four times daily as needed.  Dx code 496 01/24/15   Mosie Lukes, MD  azithromycin (ZITHROMAX) 250 MG tablet Take 1 tablet (250 mg total) by mouth daily. Take first 2 tablets together, then 1  every day until finished. 07/24/15   Quintella Reichert, MD  Cholecalciferol (VITAMIN D3) 2000 UNITS TABS Take 1 capsule by mouth daily.     Historical Provider, MD  Fluticasone-Salmeterol (ADVAIR DISKUS) 250-50 MCG/DOSE AEPB Inhale 1 puff into the lungs 2 (two) times daily. Patient not taking: Reported on 07/26/2015 05/23/15   Fonnie Mu Parrett, NP  furosemide (LASIX) 20 MG tablet Take 1/2 tablet daily 06/27/15   Mauri Pole, MD  glucose blood test strip Use as directed twice daily to check blood sugar.  Diagnosis code E11.9 11/05/14   Mosie Lukes, MD  Insulin Detemir (LEVEMIR FLEXPEN) 100 UNIT/ML Pen Inject 10 Units into the skin daily at 10 pm. Patient taking differently: Inject 14 Units into the skin daily at 10 pm.  02/11/15   Mosie Lukes, MD  Insulin Pen Needle (PEN NEEDLES) 31G X 6 MM MISC Use as directed with Levemir flexpen. 02/13/15   Mosie Lukes, MD  lactulose (CHRONULAC) 10 GM/15ML solution Take 45 mLs (30 g total) by mouth 2 (two) times daily as needed for mild constipation. 06/28/15   Mosie Lukes, MD  methylPREDNISolone (MEDROL DOSEPAK) 4 MG TBPK tablet Take according to package instructions 07/24/15   Quintella Reichert, MD  omeprazole (PRILOSEC) 20 MG capsule TAKE 1 CAPSULE(20 MG) BY MOUTH DAILY 07/04/15   Mosie Lukes, MD  predniSONE (DELTASONE) 20 MG tablet Take 2 tablets (40 mg total) by mouth daily with breakfast. 06/25/15   Brunetta Jeans, PA-C  spironolactone (ALDACTONE) 25 MG tablet Take 1 tablet (25 mg total) by mouth daily. 06/27/15   Mauri Pole, MD   BP 131/67 mmHg  Pulse 101  Temp(Src) 97.8 F (36.6 C) (Oral)  Resp 21  SpO2 90% Physical Exam  Constitutional: She is oriented to person, place, and time. She appears well-developed and well-nourished. No distress.  HENT:  Head: Normocephalic and atraumatic.  Mouth/Throat: No oropharyngeal exudate.  Eyes: Conjunctivae and EOM are normal.  Neck: Normal range of motion.  Cardiovascular: Regular rhythm,  normal heart sounds and intact distal pulses.  Tachycardia present.  Exam reveals no gallop and no friction rub.   No murmur heard. Pulmonary/Chest: Tachypnea noted. She is in respiratory distress. She has wheezes. She has no rales.  Abdominal: Soft. She exhibits no distension. There is no tenderness. There is no guarding.  Musculoskeletal: She exhibits no edema or tenderness.  Neurological: She is alert and oriented to person, place, and time.  Skin: Skin is warm and dry. No rash noted. She is not diaphoretic. No erythema.  Nursing note and vitals reviewed.   ED Course  Procedures (including critical care time) Labs Review Labs Reviewed  CBC WITH DIFFERENTIAL/PLATELET - Abnormal; Notable for the following:    RBC 5.13 (*)    Hemoglobin 15.5 (*)    Platelets 123 (*)  Monocytes Absolute 1.4 (*)    All other components within normal limits  COMPREHENSIVE METABOLIC PANEL - Abnormal; Notable for the following:    Glucose, Bld 186 (*)    Total Protein 6.2 (*)    Albumin 3.4 (*)    Total Bilirubin 2.7 (*)    All other components within normal limits  TROPONIN I - Abnormal; Notable for the following:    Troponin I 0.04 (*)    All other components within normal limits  BRAIN NATRIURETIC PEPTIDE  INFLUENZA PANEL BY PCR (TYPE A & B, H1N1)    Imaging Review Dg Chest 2 View  07/30/2015  CLINICAL DATA:  Cough and congestion with shortness of breath for 1 week EXAM: CHEST  2 VIEW COMPARISON:  July 24, 2015 FINDINGS: Lungs are somewhat hyperexpanded. There is no edema or consolidation. The heart size and pulmonary vascularity are normal. No adenopathy. No bone lesions. IMPRESSION: Lungs somewhat hyperexpanded without edema or consolidation. No change in cardiac silhouette. Electronically Signed   By: Lowella Grip III M.D.   On: 07/30/2015 13:53   I have personally reviewed and evaluated these images and lab results as part of my medical decision-making.   EKG  Interpretation   Date/Time:  Tuesday July 30 2015 12:38:13 EST Ventricular Rate:  96 PR Interval:  121 QRS Duration: 91 QT Interval:  368 QTC Calculation: 465 R Axis:   95 Text Interpretation:  Sinus tachycardia Biatrial enlargement Right axis  deviation No significant change since last tracing Confirmed by [facility]  MD, Joneric Streight ([id number]) on 07/30/2015 2:48:46 PM      MDM   Final diagnoses:  COPD with exacerbation ([place])  Elevated troponin   75 year old female with a history of COPD, diabetes, hyperlipidemia, depression, liver disease presents with concern of cough and dyspnea.  Differential diagnosis for dyspnea includes ACS, PE, COPD exacerbation, CHF exacerbation, anemia, pneumonia.  Chest x-ray was done which showed no sign of pneumonia, no edema, no pneumothroax. EKG was evaluated by me which showedsinus tachycardia without acute change from prior.  Troponin elevated to .04, which may be secondary to demand ischemia and setting a COPD exacerbation, however cannot rule out ACS at this time. Given 324mg  ASA.    Patient hypoxic to mid 80s on room air. Proceed Atrovent Atrovent, albuterol, Solu-Medrol with EMS with improvement of her respiratory status, however with continued wheezing, hypoxia and tachypnea on arrival to the emergency department. She received 3 rounds of DuoNeb in the ED with significant improvement however, continues to have some mild tachypnea and wheezing.  Patient likely with COPD exacerbation given symptoms significantly improved with nebulizer treatments.  Plan to admit for COPD exacerbation and ACS rule out given elevated troponin.  Has received Solu-Medrol, DuoNebs, aspirin, and azithromycin. Pt with history of cirrhosis on lactulose and has not had dose today--will give dose lactulose prior to transfer.   Gareth Morgan, MD 07/30/15 1904

## 2015-07-30 NOTE — ED Notes (Signed)
[facility] is aware of bed 1403 at [facility]

## 2015-07-30 NOTE — ED Notes (Signed)
MD at bedside. 

## 2015-07-31 DIAGNOSIS — F418 Other specified anxiety disorders: Secondary | ICD-10-CM | POA: Diagnosis not present

## 2015-07-31 DIAGNOSIS — J9601 Acute respiratory failure with hypoxia: Secondary | ICD-10-CM

## 2015-07-31 DIAGNOSIS — K7581 Nonalcoholic steatohepatitis (NASH): Secondary | ICD-10-CM | POA: Diagnosis not present

## 2015-07-31 DIAGNOSIS — D696 Thrombocytopenia, unspecified: Secondary | ICD-10-CM

## 2015-07-31 DIAGNOSIS — K7460 Unspecified cirrhosis of liver: Secondary | ICD-10-CM

## 2015-07-31 DIAGNOSIS — J441 Chronic obstructive pulmonary disease with (acute) exacerbation: Secondary | ICD-10-CM | POA: Diagnosis not present

## 2015-07-31 DIAGNOSIS — K219 Gastro-esophageal reflux disease without esophagitis: Secondary | ICD-10-CM | POA: Diagnosis not present

## 2015-07-31 DIAGNOSIS — R7989 Other specified abnormal findings of blood chemistry: Secondary | ICD-10-CM | POA: Diagnosis not present

## 2015-07-31 DIAGNOSIS — R778 Other specified abnormalities of plasma proteins: Secondary | ICD-10-CM | POA: Diagnosis present

## 2015-07-31 HISTORY — DX: Acute respiratory failure with hypoxia: J96.01

## 2015-07-31 HISTORY — DX: Unspecified cirrhosis of liver: K74.60

## 2015-07-31 LAB — IRON AND TIBC
Iron: 54 ug/dL (ref 28–170)
Saturation Ratios: 16 % (ref 10.4–31.8)
TIBC: 344 ug/dL (ref 250–450)
UIBC: 290 ug/dL

## 2015-07-31 LAB — COMPREHENSIVE METABOLIC PANEL
ALBUMIN: 3 g/dL — AB (ref 3.5–5.0)
ALT: 28 U/L (ref 14–54)
AST: 27 U/L (ref 15–41)
Alkaline Phosphatase: 96 U/L (ref 38–126)
Anion gap: 9 (ref 5–15)
BUN: 23 mg/dL — ABNORMAL HIGH (ref 6–20)
CHLORIDE: 101 mmol/L (ref 101–111)
CO2: 27 mmol/L (ref 22–32)
CREATININE: 0.77 mg/dL (ref 0.44–1.00)
Calcium: 8.9 mg/dL (ref 8.9–10.3)
Glucose, Bld: 194 mg/dL — ABNORMAL HIGH (ref 65–99)
POTASSIUM: 4.7 mmol/L (ref 3.5–5.1)
SODIUM: 137 mmol/L (ref 135–145)
Total Bilirubin: 2.2 mg/dL — ABNORMAL HIGH (ref 0.3–1.2)
Total Protein: 5.7 g/dL — ABNORMAL LOW (ref 6.5–8.1)

## 2015-07-31 LAB — GLUCOSE, CAPILLARY
GLUCOSE-CAPILLARY: 141 mg/dL — AB (ref 65–99)
GLUCOSE-CAPILLARY: 171 mg/dL — AB (ref 65–99)
GLUCOSE-CAPILLARY: 195 mg/dL — AB (ref 65–99)
GLUCOSE-CAPILLARY: 355 mg/dL — AB (ref 65–99)
GLUCOSE-CAPILLARY: 394 mg/dL — AB (ref 65–99)
Glucose-Capillary: 165 mg/dL — ABNORMAL HIGH (ref 65–99)

## 2015-07-31 LAB — TROPONIN I
Troponin I: <0.03 ng/mL (ref <0.031)
Troponin I: <0.03 ng/mL (ref <0.031)

## 2015-07-31 LAB — AMMONIA: AMMONIA: 45 umol/L — AB (ref 9–35)

## 2015-07-31 LAB — FERRITIN: Ferritin: 16 ng/mL (ref 11–307)

## 2015-07-31 MED ORDER — METHYLPREDNISOLONE SODIUM SUCC 40 MG IJ SOLR
40.0000 mg | Freq: Two times a day (BID) | INTRAMUSCULAR | Status: DC
  Administered 2015-07-31 – 2015-08-01 (×3): 40 mg via INTRAVENOUS
  Filled 2015-07-31 (×3): qty 1

## 2015-07-31 MED ORDER — CETYLPYRIDINIUM CHLORIDE 0.05 % MT LIQD
7.0000 mL | Freq: Two times a day (BID) | OROMUCOSAL | Status: DC
  Administered 2015-07-31 – 2015-08-01 (×2): 7 mL via OROMUCOSAL

## 2015-07-31 MED ORDER — IPRATROPIUM-ALBUTEROL 0.5-2.5 (3) MG/3ML IN SOLN
3.0000 mL | Freq: Four times a day (QID) | RESPIRATORY_TRACT | Status: DC
  Administered 2015-07-31 – 2015-08-01 (×4): 3 mL via RESPIRATORY_TRACT
  Filled 2015-07-31 (×5): qty 3

## 2015-07-31 MED ORDER — ALBUTEROL SULFATE (2.5 MG/3ML) 0.083% IN NEBU: 2.5000 mg | INHALATION_SOLUTION | RESPIRATORY_TRACT | Status: DC | PRN

## 2015-07-31 NOTE — Progress Notes (Signed)
PROGRESS NOTE  Ashley Savage [id number] DOB: 1940/11/07 DOA: 07/30/2015 PCP: Penni Homans, MD  Summary: 75 year old woman with history of COPD, tobacco use disorder, diabetes presented to the emergency department with increasing shortness of breath for 8 days, recently seen in the emergency department 12/28 and given prednisone and azithromycin, despite the symptoms worsened. Initial evaluation revealed acute hypoxic respiratory failure and COPD exacerbation.  Assessment/Plan: 1. Acute hypoxic respiratory failure secondary to COPD exacerbation. 2. COPD exacerbation.  3. Elevated troponin, now back to normal. EKG nonacute. Telemetry sinus rhythm. No evidence of ACS. 4. DM with hyperglycemia steroid induced, stable 5. 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  Chest x-ray was clear, she was already treated with azithromycin as an outpatient. No cough. No indication for antibiotics at this time.  Start steroid taper. Continue bronchodilators. Wean oxygen as tolerated. May need home oxygen.  Discontinue telemetry.  Likely home next 48 hours.  Code Status: full code DVT prophylaxis: Lovenox Family Communication: None present. Patient alert and understands plan. Disposition Plan: Home health physical therapy  Murray Hodgkins, MD  [facility]  Pager [phone] If 7PM-7AM, please contact night-coverage at [email or web address], password [facility] 07/31/2015, 11:03 AM  LOS: 1 day   Consultants:  Physical therapy: Home health  Procedures:    Antibiotics:  Azithromycin  HPI/Subjective: Feels better, no cough, breathing fine. Good appetite.  Objective: Filed Vitals:   07/30/15 2242 07/31/15 0017 07/31/15 0509 07/31/15 0959  BP:   115/53   Pulse:   96   Temp:   98.1 F (36.7 C)   TempSrc:   Oral   Resp:   22   Height: 5\' 7"  (1.702 m)     Weight:  67.5 kg (148 lb 13 oz)    SpO2:    92% 95%    Intake/Output Summary (Last 24 hours) at 07/31/15 1103 Last data filed at 07/31/15 0700  Gross per 24 hour  Intake    720 ml  Output      0 ml  Net    720 ml     Filed Weights   07/31/15 0017  Weight: 67.5 kg (148 lb 13 oz)    Exam:    General:  Appears calm and comfortable Cardiovascular: RRR, no m/r/g. No LE edema. Telemetry sinus rhythm Respiratory: CTA bilaterally, no w/r/r. Normal respiratory effort. Speaks in full sentences. Psychiatric: grossly normal mood and affect, speech fluent and appropriate  New data reviewed:  CBG control improved, fasting blood sugar 171  CMP unremarkable, ammonia minimally elevated at 45  Subsequent troponins negative, BNP was unremarkable  Pertinent data since admission:  Thrombocytopenia stable  Influenza PCR negative  CXR NAD  EKG ST  Scheduled Meds: . antiseptic oral rinse  7 mL Mouth Rinse BID  . arformoterol  15 mcg Nebulization BID  . aspirin  324 mg Oral Once  . azithromycin  500 mg Intravenous Q24H  . budesonide (PULMICORT) nebulizer solution  0.5 mg Nebulization BID  . cholecalciferol  2,000 Units Oral Daily  . enoxaparin (LOVENOX) injection  40 mg Subcutaneous Q24H  . furosemide  10 mg Oral Daily  . guaiFENesin  600 mg Oral BID  . insulin aspart  0-9 Units Subcutaneous 6 times per day  . insulin detemir  14 Units Subcutaneous Q2200  . pantoprazole  40 mg Oral Daily  . sodium chloride  3 mL Intravenous Q12H  . spironolactone  25 mg Oral Daily  Continuous Infusions:   Principal Problem:   COPD exacerbation (HCC) Active Problems:   Thrombocytopenia ([place])   Esophageal reflux   Anxiety and depression   Liver cirrhosis secondary to NASH   Elevated troponin   Time spent 20 minutes

## 2015-07-31 NOTE — H&P (Addendum)
[facility] History and Physical  Ashley Savage [id number] DOB: 04/19/1941 DOA: 07/30/2015  Referring physician: ED  PCP: Ashley Homans, MD   Chief Complaint: Shortness of breath  HPI:  Ashley Savage is a 75 year old female with a past medical history significant for COPD, tobacco abuse, anxiety, diabetes mellitus type 2, cirrhosis, and history of breast Ca; who presents with reports of progressively worsening shortness of breath 8 days. She notes symptoms started out as just a head cold noting significant drainage. She came to the emergency department at [facility] 1 week ago (12/28) and was given prednisone and a Z-Pak. She followed up with her primary care provider 2 days later and was given prescription for DuoNeb treatments. Despite all of this and she states that symptoms were not improved and sugars were going up into the 400s. She notes that normally her O2 sat generations and home are around 94% without requiring nasal cannula oxygen. She noted today prior to coming into the emergency department at [facility] she was struggling to keep her O2 sats at 91% while at rest and whenever she moved her ambulated O2 sats would drop into the mid 80s. Associated symptoms include rib soreness with coughing. Patient notes that she normally is able to go to stores and has had no energy or ability to do this over the last week. Patient notes that she still continues to smoke although has decreased the quantity of what she does smoke daily.  Upon arrival to the  emergency department patient was tachypnea, with O2 sats 84% on room air. She received 3 DuoNeb treatments with some mild improvement although still tachycardic and in some respiratory distress. Admitted patient for further evaluation and monitoring overnight.  Review of Systems  Constitutional: Positive for malaise/fatigue. Negative for fever and diaphoresis.  HENT: Negative for hearing loss and tinnitus.   Eyes:  Negative for double vision and photophobia.  Respiratory: Positive for cough, sputum production, shortness of breath and wheezing.   Cardiovascular: Negative for palpitations and claudication.  Gastrointestinal: Negative for vomiting and abdominal pain.  Genitourinary: Negative for urgency and frequency.  Musculoskeletal: Positive for joint pain. Negative for falls.  Skin: Negative for itching and rash.  Neurological: Positive for tremors. Negative for loss of consciousness.  Endo/Heme/Allergies: Does not bruise/bleed easily.  Psychiatric/Behavioral: Negative for suicidal ideas and substance abuse. The patient is nervous/anxious.       Past Medical History  Diagnosis Date  . Emphysema   . Diabetes mellitus type 2  . Hyperlipidemia   . Cancer [facility]) breast ca  right  . Anxiety   . Panic attacks   . Depression   . Arthritis of both knees 10/01/2013  . Benign paroxysmal positional vertigo 10/01/2013  . Neck pain 10/01/2013  . Esophageal reflux 10/01/2013  . Pedal edema 12/10/2013  . Overactive bladder 12/10/2013  . Tobacco abuse disorder 02/01/2014  . COPD (chronic obstructive pulmonary disease) ([place]) 10/01/2013  . Serum ammonia increased ([place]) 11/10/2014  . Hyperlipidemia, mixed   . Increased ammonia level 11/25/2014  . Diarrhea 12/30/2014  . Neuropathy (HCC)     feet   . Right knee pain   . Encephalopathy, hepatic ([place]) 06/07/2014  . Abnormal finding on liver function      Past Surgical History  Procedure Laterality Date  . Gallbladder surgery  1992  . Breast surgery  2009 right  . Appendectomy  2007  . Knee surgery    . Mandible fracture surgery    .  Pilonidal cyst excision    . Tonsillectomy    . Cataract extraction      x 2      Social History:  reports that she has been smoking Cigarettes.  She started smoking about 51 years ago. She has a 29 pack-year smoking history. She has never used smokeless tobacco. She reports that she does not drink alcohol or use illicit  drugs. Where does patient live--home   Can patient participate in ADLs? Yes  Allergies  Allergen Reactions  . Citalopram     Irregular heart beat  . Erythromycin     Stomach cramps  . Glimepiride     Elevated ammonia levels  . Versed [Midazolam] Other (See Comments)    Patient stayed confusion stayed 4+days     Family History  Problem Relation Age of Onset  . Heart failure Father   . COPD Father   . Arthritis Father 38  . Pneumonia Sister   . Breast cancer    . Stroke Mother   . Arthritis Mother 65  . Hyperlipidemia Mother   . Hypertension Mother   . Diabetes Mother   . Breast cancer Maternal Aunt   . Alcohol abuse Maternal Uncle         Prior to Admission medications   Medication Sig Start Date End Date Taking? Authorizing Provider  albuterol (PROVENTIL HFA;VENTOLIN HFA) 108 (90 BASE) MCG/ACT inhaler Inhale 2 puffs into the lungs every 6 (six) hours as needed for wheezing or shortness of breath. Only dispense Ventolin Patient taking differently: Inhale 2 puffs into the lungs every 6 (six) hours as needed for wheezing or shortness of breath. Only dispense ProAir 09/26/13  Yes Mosie Lukes, MD  albuterol (PROVENTIL) (2.5 MG/3ML) 0.083% nebulizer solution Inhale one vial via nebulizer four times daily as needed.  Dx code 496 01/24/15  Yes Mosie Lukes, MD  Cholecalciferol (VITAMIN D3) 2000 UNITS TABS Take 1 capsule by mouth daily.    Yes Historical Provider, MD  Fluticasone-Salmeterol (ADVAIR DISKUS) 250-50 MCG/DOSE AEPB Inhale 1 puff into the lungs 2 (two) times daily. 05/23/15  Yes Tammy S Parrett, NP  furosemide (LASIX) 20 MG tablet Take 1/2 tablet daily 06/27/15  Yes Mauri Pole, MD  Insulin Detemir (LEVEMIR FLEXPEN) 100 UNIT/ML Pen Inject 10 Units into the skin daily at 10 pm. Patient taking differently: Inject 14 Units into the skin daily at 10 pm.  02/11/15  Yes Mosie Lukes, MD  lactulose (CHRONULAC) 10 GM/15ML solution Take 45 mLs (30 g total) by mouth 2  (two) times daily as needed for mild constipation. 06/28/15  Yes Mosie Lukes, MD  methylPREDNISolone (MEDROL DOSEPAK) 4 MG TBPK tablet Take according to package instructions 07/24/15  Yes Quintella Reichert, MD  omeprazole (PRILOSEC) 20 MG capsule TAKE 1 CAPSULE(20 MG) BY MOUTH DAILY 07/04/15  Yes Mosie Lukes, MD  spironolactone (ALDACTONE) 25 MG tablet Take 1 tablet (25 mg total) by mouth daily. 06/27/15  Yes Kavitha Nandigam V, MD  glucose blood test strip Use as directed twice daily to check blood sugar.  Diagnosis code E11.9 11/05/14   Mosie Lukes, MD  Insulin Pen Needle (PEN NEEDLES) 31G X 6 MM MISC Use as directed with Levemir flexpen. 02/13/15   Mosie Lukes, MD     Physical Exam: Filed Vitals:   07/30/15 1901 07/30/15 2104 07/30/15 2242 07/31/15 0017  BP: 122/53     Pulse: 98     Temp:  TempSrc:      Resp: 24     Height:   5\' 7"  (1.702 m)   Weight:    67.5 kg (148 lb 13 oz)  SpO2: 96% 96%       Constitutional: Vital signs reviewed. Patient is a well-developed and well-nourished in mild respiratory distress , but cooperative with exam. Alert and oriented x3.  Head: Normocephalic and atraumatic  Ear: TM normal bilaterally  Mouth: no erythema or exudates, MMM  Eyes: PERRL, EOMI, conjunctivae normal, No scleral icterus.  Neck: Supple, Trachea midline normal ROM, No JVD, mass, thyromegaly, or carotid bruit present.  Cardiovascular: RRR, S1 normal, S2 normal, no MRG, pulses symmetric and intact bilaterally  Pulmonary/Chest: Bilateral expiratory wheezes with decreased overall air movement.  Abdominal: Soft. Non-tender, non-distended, bowel sounds are normal, no masses, organomegaly, or guarding present.  GU: no CVA tenderness Musculoskeletal: No joint deformities, erythema, or stiffness, ROM full and no nontender Ext: no edema and no cyanosis, pulses palpable bilaterally (DP and PT)  Hematology: no cervical, inginal, or axillary adenopathy.  Neurological: A&O x3, Strenght is  normal and symmetric bilaterally, cranial nerve II-XII are grossly intact, no focal motor deficit, sensory intact to light touch bilaterally.  Skin: Warm, dry and intact. No rash, cyanosis, or clubbing.  Psychiatric: Normal mood and affect. speech and behavior is normal. Judgment and thought content normal. Cognition and memory are normal.      Data Review   Micro Results No results found for this or any previous visit (from the past 240 hour(s)).  Radiology Reports Dg Chest 2 View  07/30/2015  CLINICAL DATA:  Cough and congestion with shortness of breath for 1 week EXAM: CHEST  2 VIEW COMPARISON:  July 24, 2015 FINDINGS: Lungs are somewhat hyperexpanded. There is no edema or consolidation. The heart size and pulmonary vascularity are normal. No adenopathy. No bone lesions. IMPRESSION: Lungs somewhat hyperexpanded without edema or consolidation. No change in cardiac silhouette. Electronically Signed   By: Lowella Grip III M.D.   On: 07/30/2015 13:53   Dg Chest 2 View  07/24/2015  CLINICAL DATA:  Shortness of breath, cough, wheezing EXAM: CHEST  2 VIEW COMPARISON:  10/15/2014 FINDINGS: Chronic interstitial markings/emphysematous changes. No focal consolidation. No pleural effusion or pneumothorax. The heart is normal in size. Visualized osseous structures are within normal limits. IMPRESSION: No evidence of acute cardiopulmonary disease. Electronically Signed   By: Julian Hy M.D.   On: 07/24/2015 13:49     CBC  Recent Labs Lab 07/24/15 1527 07/30/15 1300  WBC 5.1 9.3  HGB 13.7 15.5*  HCT 40.6 45.4  PLT 90* 123*  MCV 89.6 88.5  MCH 30.2 30.2  MCHC 33.7 34.1  RDW 14.8 14.9  LYMPHSABS 1.8 2.2  MONOABS 0.8 1.4*  EOSABS 0.1 0.1  BASOSABS 0.0 0.0    Chemistries   Recent Labs Lab 07/24/15 1527 07/30/15 1300  NA 139 136  K 3.8 4.5  CL 104 101  CO2 28 27  GLUCOSE 229* 186*  BUN 12 16  CREATININE 0.67 0.71  CALCIUM 8.6* 9.0  AST  --  33  ALT  --  30   ALKPHOS  --  115  BILITOT  --  2.7*   ------------------------------------------------------------------------------------------------------------------ estimated creatinine clearance is 61.8 mL/min (by C-G formula based on Cr of 0.71). ------------------------------------------------------------------------------------------------------------------ No results for input(s): HGBA1C in the last 72 hours. ------------------------------------------------------------------------------------------------------------------ No results for input(s): CHOL, HDL, LDLCALC, TRIG, CHOLHDL, LDLDIRECT in the last 72 hours. ------------------------------------------------------------------------------------------------------------------ No results  for input(s): TSH, T4TOTAL, T3FREE, THYROIDAB in the last 72 hours.  Invalid input(s): FREET3 ------------------------------------------------------------------------------------------------------------------ No results for input(s): VITAMINB12, FOLATE, FERRITIN, TIBC, IRON, RETICCTPCT in the last 72 hours.  Coagulation profile No results for input(s): INR, PROTIME in the last 168 hours.  No results for input(s): DDIMER in the last 72 hours.  Cardiac Enzymes  Recent Labs Lab 07/30/15 1300 07/30/15 2355  TROPONINI 0.04* <0.03   ------------------------------------------------------------------------------------------------------------------ Invalid input(s): POCBNP   CBG:  Recent Labs Lab 07/30/15 2057 07/31/15 0009  GLUCAP 360* 394*       EKG: Independently reviewed.  Sinus tachycardia with biatrial enlargement and right axis deviation   Assessment/Plan Principal Problem:   COPD exacerbation: Acute on chronic. Patient reports continuing to smoke intermittently previously not requiring oxygen in order to ambulate. Patient found to be hypoxic on room air at 84% on admission. - Admit to telemetry bed  - Continuous pulse oximetry and nasal  cannula oxygen to keep O2 sats greater than 92% - DuoNeb's scheduled every 6 hours and prn every 2 or shortness of breath and wheezing - Pulmicort and Brov[place] nebs scheduled every 12 hours( replacing patient's Advair inhaler for now) - IV azithromycin every 24 hrs  -  Discontinued IV Solu-Medrol previously ordered secondary to hyperglycemia    Diabetes mellitus type 2: Last hemoglobin A1c 6.54 weeks ago. Acute hyperglycemia secondary to steroids recently given. - Continue home regimen of Levemir pf 14 units q HS - CBG checks q4 hr with SSI  [place] Cirrhosis: Patient with previous history of hepatic encephalopathy. Seen by Dr.K. Denzil Magnuson , MD back in December 2016. Patient to have [place] screening due in 2017. Patient noted to have grade I esophageal varices on EGD in 12/16. - check ammonia level - continue furosemide, spironolactone, and lactulose  Elevated troponin: Initial troponin noted to be mildly elevated at 0.04. Suspect secondary to acute respiratory distress as there are no significant EKG changes - Trend troponins  Thrombocytopenia (HCC) likely secondary to above    Esophageal reflux -  Substitution of omeprazole for protonix  Anxiety and depression: Stable   Tobacco abuse - patient needs further counseling on cessation  Code Status:   full Family Communication: bedside Disposition Plan: admit   Total time spent 55 minutes.Greater than 50% of this time was spent in counseling, explanation of diagnosis, planning of further management, and coordination of care  [facility] Pager [phone]  If 7PM-7AM, please contact night-coverage [email or web address] Password [email or web address] 07/31/2015, 1:22 AM

## 2015-07-31 NOTE — Care Management Note (Signed)
Case Management Note  Patient Details  Name: NYJAE CULLIVER MRN: [id number] Date of Birth: February 05, 1941  Subjective/Objective: 75 y/o f admitted w/COPD. From home. PT-recc HHPT-await HHC orders. Noted currently qualifies for home 02-will monitor if needed @ home.                   Action/Plan:d/c plan home w/HHC.   Expected Discharge Date:                  Expected Discharge Plan:  Home/Self Care  In-House Referral:     Discharge planning Services  CM Consult  Post Acute Care Choice:    Choice offered to:     DME Arranged:    DME Agency:     HH Arranged:    HH Agency:     Status of Service:  In process, will continue to follow  Medicare Important Message Given:    Date Medicare IM Given:    Medicare IM give by:    Date Additional Medicare IM Given:    Additional Medicare Important Message give by:     If discussed at [place] of Stay Meetings, dates discussed:    Additional Comments:  Dessa Phi, RN 07/31/2015, 12:32 PM

## 2015-07-31 NOTE — Care Management Note (Signed)
Case Management Note  Patient Details  Name: Ashley Savage MRN: [id number] Date of Birth: 1941/05/10  Subjective/Objective:[facility] chosen for HHPT. TC [facility] rep [place] Cover f referral. Await HHC orders.Monitoring for home 02.                    Action/Plan:d/c plan home w/HHC.   Expected Discharge Date:                  Expected Discharge Plan:  [place]  In-House Referral:     Discharge planning Services  CM Consult  Post Acute Care Choice:    Choice offered to:  Patient  DME Arranged:    DME Agency:     HH Arranged:    [place] Agency:  [place]  Status of Service:  In process, will continue to follow  Medicare Important Message Given:    Date Medicare IM Given:    Medicare IM give by:    Date Additional Medicare IM Given:    Additional Medicare Important Message give by:     If discussed at [place] of Stay Meetings, dates discussed:    Additional Comments:  Dessa Phi, RN 07/31/2015, 1:00 PM

## 2015-07-31 NOTE — Evaluation (Signed)
Physical Therapy Evaluation Patient Details Name: Ashley Savage MRN: [id number] DOB: 18-Nov-1940 Today's Date: 07/31/2015   History of Present Illness  75 yo female admitted with COPD exac. Hx of DM, cirrhosis, breast cancer, COPD, chroic leg edema.   Clinical Impression  On eval, pt was Min guard assist for mobility-walked ~150 feet. 93% 3L at rest; 90% RA at rest. O2 sats dropped to 86% on RA during ambulation (recovered to 88% within 2-3 minutes of sitting rest break). Replaced [place] O2 once back in room. Pt appeared less steady so encouraged her to consider using walker at home. Per pt report, she has all DME needed.     Follow Up Recommendations Home health PT;Supervision - Intermittent    Equipment Recommendations  None recommended by PT    Recommendations for Other Services       Precautions / Restrictions Precautions Precautions: Fall Restrictions Weight Bearing Restrictions: No      Mobility  Bed Mobility Overal bed mobility: Modified Independent                Transfers Overall transfer level: Needs assistance   Transfers: Sit to/from Stand Sit to Stand: Supervision         General transfer comment: for safety  Ambulation/Gait Ambulation/Gait assistance: Min guard Ambulation Distance (Feet): 150 Feet Assistive device: Straight cane Gait Pattern/deviations: Step-through pattern;Decreased stride length     General Gait Details: unsteady. Pt used cane soley for ~50% of distance, then she needed to hold on to dynamap + cane for rest of distance. O2 sats 86% on RA during ambulation.   Stairs            Wheelchair Mobility    Modified Rankin (Stroke Patients Only)       Balance                                             Pertinent Vitals/Pain Pain Assessment: No/denies pain    Home Living Family/patient expects to be discharged to:: Private residence Living Arrangements: Alone   Type of Home: Apartment Home Access:  Level entry     Home Layout: One [place]: [place] - 4 wheels;Shower seat;Grab bars - tub/shower;Cane - single point      Prior Function Level of Independence: Independent with assistive device(s)         Comments: uses cane PRN     Hand Dominance   Dominant Hand: Right    Extremity/Trunk Assessment   Upper Extremity Assessment: Generalized weakness           Lower Extremity Assessment: Generalized weakness      Cervical / Trunk Assessment: Kyphotic  Communication   Communication: No difficulties  Cognition Arousal/Alertness: Awake/alert Behavior During Therapy: WFL for tasks assessed/performed Overall Cognitive Status: Within Functional Limits for tasks assessed                      General Comments      Exercises        Assessment/Plan    PT Assessment Patient needs continued PT services  PT Diagnosis Difficulty walking;Generalized weakness   PT Problem List Decreased strength;Decreased activity tolerance;Decreased balance;Decreased mobility  PT Treatment Interventions Gait training;Functional mobility training;Therapeutic activities;Patient/family education;Balance training;Therapeutic exercise   PT Goals (Current goals can be found in the Care Plan section) Acute Rehab PT Goals Patient Stated Goal:  home PT Goal Formulation: With patient Time For Goal Achievement: 08/14/15 Potential to Achieve Goals: Good    Frequency Min 3X/week   Barriers to discharge        Co-evaluation               End of Session Equipment Utilized During Treatment: Gait belt Activity Tolerance: Patient tolerated treatment well Patient left: in chair;with call bell/phone within reach           Time: 1015-1045 PT Time Calculation (min) (ACUTE ONLY): 30 min   Charges:   PT Evaluation $PT Eval Low Complexity: 1 Procedure PT Treatments $Gait Training: 8-22 mins   PT G Codes:        Weston Anna, MPT Pager: [phone]

## 2015-08-01 DIAGNOSIS — E1165 Type 2 diabetes mellitus with hyperglycemia: Secondary | ICD-10-CM | POA: Insufficient documentation

## 2015-08-01 DIAGNOSIS — J441 Chronic obstructive pulmonary disease with (acute) exacerbation: Secondary | ICD-10-CM

## 2015-08-01 DIAGNOSIS — K7581 Nonalcoholic steatohepatitis (NASH): Secondary | ICD-10-CM

## 2015-08-01 DIAGNOSIS — K219 Gastro-esophageal reflux disease without esophagitis: Secondary | ICD-10-CM | POA: Diagnosis not present

## 2015-08-01 DIAGNOSIS — J9601 Acute respiratory failure with hypoxia: Secondary | ICD-10-CM | POA: Diagnosis not present

## 2015-08-01 DIAGNOSIS — F418 Other specified anxiety disorders: Secondary | ICD-10-CM | POA: Diagnosis not present

## 2015-08-01 DIAGNOSIS — Z794 Long term (current) use of insulin: Secondary | ICD-10-CM

## 2015-08-01 LAB — GLUCOSE, CAPILLARY
GLUCOSE-CAPILLARY: 154 mg/dL — AB (ref 65–99)
GLUCOSE-CAPILLARY: 162 mg/dL — AB (ref 65–99)
GLUCOSE-CAPILLARY: 185 mg/dL — AB (ref 65–99)
GLUCOSE-CAPILLARY: 240 mg/dL — AB (ref 65–99)
Glucose-Capillary: 150 mg/dL — ABNORMAL HIGH (ref 65–99)

## 2015-08-01 MED ORDER — SALINE SPRAY 0.65 % NA SOLN: 1.0000 | NASAL | Status: DC | PRN

## 2015-08-01 MED ORDER — GUAIFENESIN ER 600 MG PO TB12: 600.0000 mg | ORAL_TABLET | Freq: Two times a day (BID) | ORAL | Status: DC

## 2015-08-01 MED ORDER — LORATADINE 10 MG PO TABS: 10.0000 mg | ORAL_TABLET | Freq: Every day | ORAL | Status: DC

## 2015-08-01 MED ORDER — IPRATROPIUM-ALBUTEROL 0.5-2.5 (3) MG/3ML IN SOLN: 3.0000 mL | RESPIRATORY_TRACT | Status: DC | PRN

## 2015-08-01 MED ORDER — PREDNISONE 20 MG PO TABS: ORAL_TABLET | ORAL | Status: DC

## 2015-08-01 MED ORDER — INSULIN DETEMIR 100 UNIT/ML FLEXPEN: 14.0000 [IU] | PEN_INJECTOR | Freq: Every day | SUBCUTANEOUS | Status: DC

## 2015-08-01 MED ORDER — BUDESONIDE 0.5 MG/2ML IN SUSP: 0.5000 mg | Freq: Two times a day (BID) | RESPIRATORY_TRACT | Status: DC

## 2015-08-01 MED ORDER — SALINE SPRAY 0.65 % NA SOLN
1.0000 | NASAL | Status: DC | PRN
  Administered 2015-08-01: 1 via NASAL
  Filled 2015-08-01: qty 44

## 2015-08-01 NOTE — Progress Notes (Signed)
SATURATION QUALIFICATIONS: (This note is used to comply with regulatory documentation for home oxygen)  Patient Saturations on Room Air at Rest = 96%  Patient Saturations on Room Air while Ambulating = 84%   Patient Saturations on 1 Liters of oxygen while Ambulating = 94%  Please briefly explain why patient needs home oxygen: patient desats with walking

## 2015-08-01 NOTE — Progress Notes (Signed)
Physical Therapy Treatment Patient Details Name: ADALENA ENSMINGER MRN: [id number] DOB: 1940-08-17 Today's Date: 08/14/15    History of Present Illness 75 yo female admitted with COPD exac. Hx of DM, cirrhosis, breast cancer, COPD, chronic leg edema.     PT Comments    Pt ambulated in hallway and required supplemental oxygen.  Pt hopeful to d/c home soon and begin HHPT.  SATURATION QUALIFICATIONS: (This note is used to comply with regulatory documentation for home oxygen)  Patient Saturations on Room Air at Rest = 92%  Patient Saturations on Room Air while Ambulating = 85%  Patient Saturations on 2 Liters of oxygen while Ambulating = 92%  Please briefly explain why patient needs home oxygen: to improve oxygen saturation during physical activity such as ambulation.   Follow Up Recommendations  Home health PT;Supervision - Intermittent     Equipment Recommendations  None recommended by PT    Recommendations for Other Services       Precautions / Restrictions Precautions Precautions: Fall    Mobility  Bed Mobility                  Transfers Overall transfer level: Needs assistance Equipment used: None Transfers: Sit to/from Stand Sit to Stand: Supervision         General transfer comment: for safety  Ambulation/Gait Ambulation/Gait assistance: Min guard Ambulation Distance (Feet): 400 Feet Assistive device: None Gait Pattern/deviations: Step-through pattern;Decreased stride length     General Gait Details: steady with straight path, however LOB with challenges such as looking over shoulder, pt able to self correct, encouraged using SPC upon return home for safety, one seated rest break, SpO2 dropped to 85% on room air so applied 2L O2   Stairs            Wheelchair Mobility    Modified Rankin (Stroke Patients Only)       Balance                                    Cognition Arousal/Alertness: Awake/alert Behavior During  Therapy: WFL for tasks assessed/performed Overall Cognitive Status: Within Functional Limits for tasks assessed                      Exercises      General Comments        Pertinent Vitals/Pain Pain Assessment: No/denies pain    Home Living                      Prior Function            PT Goals (current goals can now be found in the care plan section) Progress towards PT goals: Progressing toward goals    Frequency  Min 3X/week    PT Plan Current plan remains appropriate    Co-evaluation             End of Session Equipment Utilized During Treatment: Gait belt Activity Tolerance: Patient tolerated treatment well Patient left: in chair;with call bell/phone within reach     Time: 0935-0958 PT Time Calculation (min) (ACUTE ONLY): 23 min  Charges:  $Gait Training: 8-22 mins                    G Codes:      Maurita Havener,KATHrine E Aug 14, 2015, 1:25 PM Carmelia Bake, PT, DPT August 14, 2015 Pager: [phone]

## 2015-08-01 NOTE — Discharge Summary (Signed)
Physician Discharge Summary  Ashley Savage [id number] DOB: 12-05-1940 DOA: 07/30/2015  PCP: Penni Homans, MD  Admit date: 07/30/2015 Discharge date: 08/01/2015  Time spent: 35 minutes  Recommendations for Outpatient Follow-up:  1. Reassess for oxygen needs 2. Follow CBG's as they have been running high from use of steroids  Discharge Diagnoses:  Principal Problem:   Acute respiratory failure with hypoxia ([place]) Active Problems:   Thrombocytopenia (HCC)   Esophageal reflux   Anxiety and depression   COPD exacerbation (HCC)   Liver cirrhosis secondary to NASH   Elevated troponin   Discharge Condition: stable and improved. Discharge home with instructions to follow up with PCP in 10 days and with Pulmonary service in 2 weeks approx. No fever, no CP, no nausea, no vomiting and able to speak in full sentences. Patient with need of Oxygen supplementation, especially for exertion.  Diet recommendation: low sodium diet   Filed Weights   07/31/15 0017  Weight: 67.5 kg (148 lb 13 oz)    History of present illness:  75 year old female with a past medical history significant for COPD, tobacco abuse, anxiety, diabetes mellitus type 2, cirrhosis, and history of breast Ca; who presents with reports of progressively worsening shortness of breath 8 days. She notes symptoms started out as just a head cold noting significant drainage. She came to the emergency department at [facility] 2 1 week ago (12/28) and was given prednisone and a Z-Pak. She followed up with her primary care provider 2 days later and was given prescription for DuoNeb treatments. Despite all of this and she states that symptoms were not improved and sugars were going up into the 400s. She notes that normally her O2 sat generations and home are around 94% without requiring nasal cannula oxygen. She noted today prior to coming into the emergency department at [facility] she was struggling to keep her O2 sats at  91% while at rest and whenever she moved her ambulated O2 sats would drop into the mid 80s. Associated symptoms include rib soreness with coughing. Patient notes that she normally is able to go to stores and has had no energy or ability to do this over the last week. Patient notes that she still continues to smoke although has decreased the quantity of what she does smoke daily.  Upon arrival to the emergency department patient was tachypnea, with O2 sats 84% on room air.   Hospital Course:  1. Acute hypoxic respiratory failure secondary to COPD exacerbation. Improved and stable for discharge. Will discharge on tapering steroids and nebulizer treatment with PRN duoneb and pulmicort BID. Patient will follow up with pulmonary service in 2 weeks. No fever, no wheezing at discharge and no productive cough. Patient required 1L oxygen supplementation, especially for exertion. 2. COPD exacerbation. Treatment as mentioned above. Outpatient follow up with pulmonary service  3. Elevated troponin, now back to normal. EKG nonacute. Telemetry sinus rhythm. No evidence of ACS. No CP. 4. DM with hyperglycemia steroid induced, stable; and with most recent A1C of 6.5 in Dec 2016 5. NASH cirrhosis with thrombocytopenia and associated esophageal varices by EGD 06/2015. Continue PPI, lasix and spironolactone. No ascites on exam. 6. Depression/anxiety: will continue home medication regimen. No SI or hallucinations   Procedures:  See below for x-ray reports   Consultations:  None   Discharge Exam: Filed Vitals:   08/01/15 0548 08/01/15 1311  BP: 112/43 142/48  Pulse: 85 91  Temp: 97.8 F (36.6 C) 97.5 F (  36.4 C)  Resp: 18 19   7. General: Appears calm and comfortable. Desaturation with activity, but good on 1 L of oxyge, no wheezing and able to speak in full sentences 8. Cardiovascular: RRR, no m/r/g. No LE edema. 9. Telemetry sinus rhythm 10. Respiratory: CTA bilaterally, no w/r/r. Normal  respiratory effort. Speaks in full sentences and no wheezing on exam 11. Psychiatric: grossly normal mood and affect, speech fluent and appropriate   Discharge Instructions   Discharge Instructions    Diet - low sodium heart healthy    Complete by:  As directed      Discharge instructions    Complete by:  As directed   Take medications as prescribed Please follow up with PCP in 10 days Keep yourself well hydrated Minimize exposure to cold and use humidifier inside the house          Current Discharge Medication List    START taking these medications   Details  budesonide (PULMICORT) 0.5 MG/2ML nebulizer solution Take 2 mLs (0.5 mg total) by nebulization 2 (two) times daily. Qty: 120 mL, Refills: 3    guaiFENesin (MUCINEX) 600 MG 12 hr tablet Take 1 tablet (600 mg total) by mouth 2 (two) times daily. Qty: 40 tablet, Refills: 0    ipratropium-albuterol (DUONEB) 0.5-2.5 (3) MG/3ML SOLN Take 3 mLs by nebulization every 4 (four) hours as needed (SOB and wheezing). Qty: 360 mL, Refills: 1    loratadine (CLARITIN) 10 MG tablet Take 1 tablet (10 mg total) by mouth daily. Qty: 30 tablet, Refills: 1    predniSONE (DELTASONE) 20 MG tablet Take 2 tablets by mouth X 2 days; then 1 tablet by mouth X 3 days; then 1/2 tablet by mouth X 3 days and stop prednisone Qty: 10 tablet, Refills: 0    sodium chloride (OCEAN) 0.65 % SOLN nasal spray Place 1 spray into both nostrils as needed for congestion. Qty: 30 mL, Refills: 0      CONTINUE these medications which have CHANGED   Details  Insulin Detemir (LEVEMIR FLEXPEN) 100 UNIT/ML Pen Inject 14 Units into the skin daily at 10 pm.      CONTINUE these medications which have NOT CHANGED   Details  albuterol (PROVENTIL HFA;VENTOLIN HFA) 108 (90 BASE) MCG/ACT inhaler Inhale 2 puffs into the lungs every 6 (six) hours as needed for wheezing or shortness of breath. Only dispense Ventolin Qty: 1 Inhaler, Refills: 3    Cholecalciferol (VITAMIN  D3) 2000 UNITS TABS Take 1 capsule by mouth daily.     furosemide (LASIX) 20 MG tablet Take 1/2 tablet daily Qty: 30 tablet, Refills: 1    lactulose (CHRONULAC) 10 GM/15ML solution Take 45 mLs (30 g total) by mouth 2 (two) times daily as needed for mild constipation. Qty: 1892 mL, Refills: 1   Associated Diagnoses: Acute encephalopathy; Abdominal pain, unspecified abdominal location; Serum ammonia increased (HCC)    omeprazole (PRILOSEC) 20 MG capsule TAKE 1 CAPSULE(20 MG) BY MOUTH DAILY Qty: 90 capsule, Refills: 3    spironolactone (ALDACTONE) 25 MG tablet Take 1 tablet (25 mg total) by mouth daily. Qty: 30 tablet, Refills: 2    glucose blood test strip Use as directed twice daily to check blood sugar.  Diagnosis code E11.9 Qty: 100 each, Refills: 6    Insulin Pen Needle (PEN NEEDLES) 31G X 6 MM MISC Use as directed with Levemir flexpen. Qty: 50 each, Refills: 6      STOP taking these medications     albuterol (  PROVENTIL) (2.5 MG/3ML) 0.083% nebulizer solution      Fluticasone-Salmeterol (ADVAIR DISKUS) 250-50 MCG/DOSE AEPB      methylPREDNISolone (MEDROL DOSEPAK) 4 MG TBPK tablet        Allergies  Allergen Reactions  . Citalopram     Irregular heart beat  . Erythromycin     Stomach cramps  . Glimepiride     Elevated ammonia levels  . Versed [Midazolam] Other (See Comments)    Patient stayed confusion stayed 4+days    Follow-up Information    Follow up with Penni Homans, MD. Schedule an appointment as soon as possible for a visit in 10 days.   Specialty:  Family Medicine   Contact information:   [address] [place] [address] [phone]       Follow up with PARRETT,TAMMY, NP. Call in 2 weeks.   Specialty:  Pulmonary Disease   Why:  office for to set up appointment    Contact information:   [address] [place] [address] [phone]        The results of significant diagnostics from this hospitalization (including  imaging, microbiology, ancillary and laboratory) are listed below for reference.    Significant Diagnostic Studies: Dg Chest 2 View  07/30/2015  CLINICAL DATA:  Cough and congestion with shortness of breath for 1 week EXAM: CHEST  2 VIEW COMPARISON:  July 24, 2015 FINDINGS: Lungs are somewhat hyperexpanded. There is no edema or consolidation. The heart size and pulmonary vascularity are normal. No adenopathy. No bone lesions. IMPRESSION: Lungs somewhat hyperexpanded without edema or consolidation. No change in cardiac silhouette. Electronically Signed   By: Lowella Grip III M.D.   On: 07/30/2015 13:53   Dg Chest 2 View  07/24/2015  CLINICAL DATA:  Shortness of breath, cough, wheezing EXAM: CHEST  2 VIEW COMPARISON:  10/15/2014 FINDINGS: Chronic interstitial markings/emphysematous changes. No focal consolidation. No pleural effusion or pneumothorax. The heart is normal in size. Visualized osseous structures are within normal limits. IMPRESSION: No evidence of acute cardiopulmonary disease. Electronically Signed   By: Julian Hy M.D.   On: 07/24/2015 13:49    Labs: Basic Metabolic Panel:  Recent Labs Lab 07/30/15 1300 07/31/15 0537  NA 136 137  K 4.5 4.7  CL 101 101  CO2 27 27  GLUCOSE 186* 194*  BUN 16 23*  CREATININE 0.71 0.77  CALCIUM 9.0 8.9   Liver Function Tests:  Recent Labs Lab 07/30/15 1300 07/31/15 0537  AST 33 27  ALT 30 28  ALKPHOS 115 96  BILITOT 2.7* 2.2*  PROT 6.2* 5.7*  ALBUMIN 3.4* 3.0*   No results for input(s): LIPASE, AMYLASE in the last 168 hours.  Recent Labs Lab 07/31/15 0537  AMMONIA 45*   CBC:  Recent Labs Lab 07/30/15 1300  WBC 9.3  NEUTROABS 5.5  HGB 15.5*  HCT 45.4  MCV 88.5  PLT 123*   Cardiac Enzymes:  Recent Labs Lab 07/30/15 1300 07/30/15 2355 07/31/15 0537 07/31/15 1113  TROPONINI 0.04* <0.03 <0.03 <0.03   BNP: BNP (last 3 results)  Recent Labs  07/30/15 1300  BNP 64.1   CBG:  Recent Labs Lab  08/01/15 0004 08/01/15 0405 08/01/15 0733 08/01/15 1150 08/01/15 1548  GLUCAP 185* 162* 154* 240* 150*    Signed:  Barton Dubois MD    [facility] 08/01/2015, 4:43 PM

## 2015-08-01 NOTE — Progress Notes (Signed)
Patient transferred to room 1323, NAD noted agree assessment.

## 2015-08-01 NOTE — Care Management (Signed)
ED CM received call from [place] at [facility] regarding patient requiring home continuous oxygen set up, CM discussed recommendation for home oxygen with patient she is agreeable. Offered choice, [facility] was selected. Referral faxed into [facility], and spoke with [facility] oxygen will be delivered to room prior to discharge. Updated patient, teach back done patient verbalize understanding. No further CM needs identified.

## 2015-08-01 NOTE — Progress Notes (Signed)
Discharge instructions reviewed with patient, questions answered, verbalized understanding.  Prescriptions called in to [facility] and scripts given for her to present to them when she goes to pick up her medications.  [facility] arrived with home oxygen prior to discharge.  Patient transported to front of hospital via wheelchair to be taken home by family member.

## 2015-08-05 ENCOUNTER — Telehealth: Payer: Self-pay | Admitting: Family Medicine

## 2015-08-05 ENCOUNTER — Telehealth: Payer: Self-pay | Admitting: *Deleted

## 2015-08-05 DIAGNOSIS — T473X6A Underdosing of saline and osmotic laxatives, initial encounter: Secondary | ICD-10-CM | POA: Diagnosis not present

## 2015-08-05 DIAGNOSIS — Z91138 Patient's unintentional underdosing of medication regimen for other reason: Secondary | ICD-10-CM | POA: Diagnosis not present

## 2015-08-05 DIAGNOSIS — R400 Somnolence: Secondary | ICD-10-CM | POA: Diagnosis not present

## 2015-08-05 DIAGNOSIS — F418 Other specified anxiety disorders: Secondary | ICD-10-CM | POA: Diagnosis not present

## 2015-08-05 DIAGNOSIS — Z794 Long term (current) use of insulin: Secondary | ICD-10-CM | POA: Diagnosis not present

## 2015-08-05 DIAGNOSIS — J449 Chronic obstructive pulmonary disease, unspecified: Secondary | ICD-10-CM | POA: Diagnosis not present

## 2015-08-05 DIAGNOSIS — D696 Thrombocytopenia, unspecified: Secondary | ICD-10-CM | POA: Diagnosis not present

## 2015-08-05 DIAGNOSIS — E871 Hypo-osmolality and hyponatremia: Secondary | ICD-10-CM | POA: Diagnosis not present

## 2015-08-05 DIAGNOSIS — F411 Generalized anxiety disorder: Secondary | ICD-10-CM | POA: Diagnosis not present

## 2015-08-05 DIAGNOSIS — E119 Type 2 diabetes mellitus without complications: Secondary | ICD-10-CM | POA: Diagnosis not present

## 2015-08-05 DIAGNOSIS — Z9981 Dependence on supplemental oxygen: Secondary | ICD-10-CM | POA: Diagnosis not present

## 2015-08-05 DIAGNOSIS — I6789 Other cerebrovascular disease: Secondary | ICD-10-CM | POA: Diagnosis not present

## 2015-08-05 DIAGNOSIS — H8110 Benign paroxysmal vertigo, unspecified ear: Secondary | ICD-10-CM | POA: Diagnosis not present

## 2015-08-05 DIAGNOSIS — F4489 Other dissociative and conversion disorders: Secondary | ICD-10-CM | POA: Diagnosis not present

## 2015-08-05 DIAGNOSIS — K7290 Hepatic failure, unspecified without coma: Secondary | ICD-10-CM | POA: Diagnosis not present

## 2015-08-05 DIAGNOSIS — Z720 Tobacco use: Secondary | ICD-10-CM | POA: Diagnosis not present

## 2015-08-05 DIAGNOSIS — Z853 Personal history of malignant neoplasm of breast: Secondary | ICD-10-CM | POA: Diagnosis not present

## 2015-08-05 DIAGNOSIS — Z79899 Other long term (current) drug therapy: Secondary | ICD-10-CM | POA: Diagnosis not present

## 2015-08-05 DIAGNOSIS — R2689 Other abnormalities of gait and mobility: Secondary | ICD-10-CM | POA: Diagnosis not present

## 2015-08-05 DIAGNOSIS — Z881 Allergy status to other antibiotic agents status: Secondary | ICD-10-CM | POA: Diagnosis not present

## 2015-08-05 DIAGNOSIS — E1165 Type 2 diabetes mellitus with hyperglycemia: Secondary | ICD-10-CM | POA: Diagnosis not present

## 2015-08-05 DIAGNOSIS — Z888 Allergy status to other drugs, medicaments and biological substances status: Secondary | ICD-10-CM | POA: Diagnosis not present

## 2015-08-05 DIAGNOSIS — E7800 Pure hypercholesterolemia, unspecified: Secondary | ICD-10-CM | POA: Diagnosis not present

## 2015-08-05 DIAGNOSIS — K7581 Nonalcoholic steatohepatitis (NASH): Secondary | ICD-10-CM | POA: Diagnosis not present

## 2015-08-05 DIAGNOSIS — Z9049 Acquired absence of other specified parts of digestive tract: Secondary | ICD-10-CM | POA: Diagnosis not present

## 2015-08-05 DIAGNOSIS — J441 Chronic obstructive pulmonary disease with (acute) exacerbation: Secondary | ICD-10-CM | POA: Diagnosis not present

## 2015-08-05 DIAGNOSIS — Z7951 Long term (current) use of inhaled steroids: Secondary | ICD-10-CM | POA: Diagnosis not present

## 2015-08-05 DIAGNOSIS — Z7952 Long term (current) use of systemic steroids: Secondary | ICD-10-CM | POA: Diagnosis not present

## 2015-08-05 DIAGNOSIS — R4182 Altered mental status, unspecified: Secondary | ICD-10-CM | POA: Diagnosis not present

## 2015-08-05 DIAGNOSIS — M6281 Muscle weakness (generalized): Secondary | ICD-10-CM | POA: Diagnosis not present

## 2015-08-05 DIAGNOSIS — K7460 Unspecified cirrhosis of liver: Secondary | ICD-10-CM | POA: Diagnosis not present

## 2015-08-05 DIAGNOSIS — K219 Gastro-esophageal reflux disease without esophagitis: Secondary | ICD-10-CM | POA: Diagnosis not present

## 2015-08-05 DIAGNOSIS — E7220 Disorder of urea cycle metabolism, unspecified: Secondary | ICD-10-CM | POA: Diagnosis not present

## 2015-08-05 DIAGNOSIS — F17210 Nicotine dependence, cigarettes, uncomplicated: Secondary | ICD-10-CM | POA: Diagnosis not present

## 2015-08-05 DIAGNOSIS — E1140 Type 2 diabetes mellitus with diabetic neuropathy, unspecified: Secondary | ICD-10-CM | POA: Diagnosis not present

## 2015-08-05 NOTE — Telephone Encounter (Signed)
When her bp is low ok to hold furosemide dose. Happy to refer but does she mean like a [facility] social work referral for evaluation and possible assistance at home with [facility] or a Education officer, museum here in the office for counselling?

## 2015-08-05 NOTE — Telephone Encounter (Signed)
Caller name:Katie Carlota Raspberry Relationship to patient:Nurse for advanced home  care Can be [phone] Pharmacy:  Reason for call:Patients bp is standing 90/50 sitting 120/60.  Today she is experiencing a lot of confusion.  She forgot to take lactulose yesterday.  Since she has taken it she feels like she is getting better  She would like a social work eval.

## 2015-08-05 NOTE — Telephone Encounter (Signed)
Received fax from [facility] to report Delay of Care; forwarded to provider/SLS

## 2015-08-06 DIAGNOSIS — E7800 Pure hypercholesterolemia, unspecified: Secondary | ICD-10-CM | POA: Diagnosis not present

## 2015-08-06 DIAGNOSIS — K7460 Unspecified cirrhosis of liver: Secondary | ICD-10-CM | POA: Diagnosis not present

## 2015-08-06 DIAGNOSIS — Z888 Allergy status to other drugs, medicaments and biological substances status: Secondary | ICD-10-CM | POA: Diagnosis not present

## 2015-08-06 DIAGNOSIS — E871 Hypo-osmolality and hyponatremia: Secondary | ICD-10-CM | POA: Diagnosis not present

## 2015-08-06 DIAGNOSIS — J449 Chronic obstructive pulmonary disease, unspecified: Secondary | ICD-10-CM | POA: Diagnosis not present

## 2015-08-06 DIAGNOSIS — F17210 Nicotine dependence, cigarettes, uncomplicated: Secondary | ICD-10-CM | POA: Diagnosis not present

## 2015-08-06 DIAGNOSIS — Z91138 Patient's unintentional underdosing of medication regimen for other reason: Secondary | ICD-10-CM | POA: Diagnosis not present

## 2015-08-06 DIAGNOSIS — Z79899 Other long term (current) drug therapy: Secondary | ICD-10-CM | POA: Diagnosis not present

## 2015-08-06 DIAGNOSIS — K7290 Hepatic failure, unspecified without coma: Secondary | ICD-10-CM | POA: Diagnosis not present

## 2015-08-06 DIAGNOSIS — R4182 Altered mental status, unspecified: Secondary | ICD-10-CM | POA: Diagnosis not present

## 2015-08-06 DIAGNOSIS — T473X6A Underdosing of saline and osmotic laxatives, initial encounter: Secondary | ICD-10-CM | POA: Diagnosis not present

## 2015-08-06 DIAGNOSIS — E119 Type 2 diabetes mellitus without complications: Secondary | ICD-10-CM | POA: Diagnosis not present

## 2015-08-06 DIAGNOSIS — F411 Generalized anxiety disorder: Secondary | ICD-10-CM | POA: Diagnosis not present

## 2015-08-06 DIAGNOSIS — E7220 Disorder of urea cycle metabolism, unspecified: Secondary | ICD-10-CM | POA: Diagnosis not present

## 2015-08-06 DIAGNOSIS — Z881 Allergy status to other antibiotic agents status: Secondary | ICD-10-CM | POA: Diagnosis not present

## 2015-08-06 DIAGNOSIS — Z7951 Long term (current) use of inhaled steroids: Secondary | ICD-10-CM | POA: Diagnosis not present

## 2015-08-06 DIAGNOSIS — R400 Somnolence: Secondary | ICD-10-CM | POA: Diagnosis not present

## 2015-08-06 DIAGNOSIS — Z7952 Long term (current) use of systemic steroids: Secondary | ICD-10-CM | POA: Diagnosis not present

## 2015-08-06 DIAGNOSIS — J441 Chronic obstructive pulmonary disease with (acute) exacerbation: Secondary | ICD-10-CM | POA: Diagnosis not present

## 2015-08-06 DIAGNOSIS — Z9049 Acquired absence of other specified parts of digestive tract: Secondary | ICD-10-CM | POA: Diagnosis not present

## 2015-08-06 NOTE — Telephone Encounter (Signed)
[facility] RN informed of PCP instructions.  [facility] stated they are sending out their own social worker to talk to the patient.  The patient was a little depressed yesterday as due to forgetting her lactulose she felt bad.  They will be working with this patient, no referral needed at this time.

## 2015-08-07 ENCOUNTER — Telehealth: Payer: Self-pay

## 2015-08-07 DIAGNOSIS — R400 Somnolence: Secondary | ICD-10-CM | POA: Diagnosis not present

## 2015-08-07 NOTE — Telephone Encounter (Signed)
PCP: Penni Homans, MD  Admit date: 07/30/2015 Discharge date: 08/01/2015  Recommendations for Outpatient Follow-up:  1. Reassess for oxygen needs 2. Follow CBG's as they have been running high from use of steroids  Discharge Diagnoses:  Principal Problem:  Acute respiratory failure with hypoxia ([place]) Active Problems:  Thrombocytopenia (HCC)  Esophageal reflux  Anxiety and depression  COPD exacerbation (HCC)  Liver cirrhosis secondary to NASH  Elevated troponin   Discharge Condition: stable and improved. Discharge home with instructions to follow up with PCP in 10 days and with Pulmonary service in 2 weeks approx. No fever, no CP, no nausea, no vomiting and able to speak in full sentences. Patient with need of Oxygen supplementation, especially for exertion.  Diet recommendation: low sodium diet    Hospital follow up needed.  Left a message for call back.

## 2015-08-08 DIAGNOSIS — R400 Somnolence: Secondary | ICD-10-CM | POA: Diagnosis not present

## 2015-08-08 NOTE — Telephone Encounter (Signed)
Left a message for call back.  

## 2015-08-09 DIAGNOSIS — E784 Other hyperlipidemia: Secondary | ICD-10-CM | POA: Diagnosis not present

## 2015-08-09 DIAGNOSIS — M6281 Muscle weakness (generalized): Secondary | ICD-10-CM | POA: Diagnosis not present

## 2015-08-09 DIAGNOSIS — R4182 Altered mental status, unspecified: Secondary | ICD-10-CM | POA: Diagnosis not present

## 2015-08-09 DIAGNOSIS — E119 Type 2 diabetes mellitus without complications: Secondary | ICD-10-CM | POA: Diagnosis not present

## 2015-08-09 DIAGNOSIS — G939 Disorder of brain, unspecified: Secondary | ICD-10-CM | POA: Diagnosis not present

## 2015-08-09 DIAGNOSIS — K219 Gastro-esophageal reflux disease without esophagitis: Secondary | ICD-10-CM | POA: Diagnosis not present

## 2015-08-09 DIAGNOSIS — E440 Moderate protein-calorie malnutrition: Secondary | ICD-10-CM | POA: Diagnosis not present

## 2015-08-09 DIAGNOSIS — D6949 Other primary thrombocytopenia: Secondary | ICD-10-CM | POA: Diagnosis not present

## 2015-08-09 DIAGNOSIS — K7469 Other cirrhosis of liver: Secondary | ICD-10-CM | POA: Diagnosis not present

## 2015-08-09 DIAGNOSIS — J9690 Respiratory failure, unspecified, unspecified whether with hypoxia or hypercapnia: Secondary | ICD-10-CM | POA: Diagnosis not present

## 2015-08-09 DIAGNOSIS — R062 Wheezing: Secondary | ICD-10-CM | POA: Diagnosis not present

## 2015-08-09 DIAGNOSIS — G9349 Other encephalopathy: Secondary | ICD-10-CM | POA: Diagnosis not present

## 2015-08-09 DIAGNOSIS — K7460 Unspecified cirrhosis of liver: Secondary | ICD-10-CM | POA: Diagnosis not present

## 2015-08-09 DIAGNOSIS — E1165 Type 2 diabetes mellitus with hyperglycemia: Secondary | ICD-10-CM | POA: Diagnosis not present

## 2015-08-09 DIAGNOSIS — J449 Chronic obstructive pulmonary disease, unspecified: Secondary | ICD-10-CM | POA: Diagnosis not present

## 2015-08-09 DIAGNOSIS — R400 Somnolence: Secondary | ICD-10-CM | POA: Diagnosis not present

## 2015-08-09 DIAGNOSIS — F3340 Major depressive disorder, recurrent, in remission, unspecified: Secondary | ICD-10-CM | POA: Diagnosis not present

## 2015-08-09 DIAGNOSIS — R2689 Other abnormalities of gait and mobility: Secondary | ICD-10-CM | POA: Diagnosis not present

## 2015-08-09 DIAGNOSIS — I2510 Atherosclerotic heart disease of native coronary artery without angina pectoris: Secondary | ICD-10-CM | POA: Diagnosis not present

## 2015-08-09 DIAGNOSIS — R0989 Other specified symptoms and signs involving the circulatory and respiratory systems: Secondary | ICD-10-CM | POA: Diagnosis not present

## 2015-08-12 DIAGNOSIS — I2510 Atherosclerotic heart disease of native coronary artery without angina pectoris: Secondary | ICD-10-CM | POA: Diagnosis not present

## 2015-08-12 DIAGNOSIS — K219 Gastro-esophageal reflux disease without esophagitis: Secondary | ICD-10-CM | POA: Diagnosis not present

## 2015-08-12 DIAGNOSIS — D6949 Other primary thrombocytopenia: Secondary | ICD-10-CM | POA: Diagnosis not present

## 2015-08-12 DIAGNOSIS — E440 Moderate protein-calorie malnutrition: Secondary | ICD-10-CM | POA: Diagnosis not present

## 2015-08-12 DIAGNOSIS — K7469 Other cirrhosis of liver: Secondary | ICD-10-CM | POA: Diagnosis not present

## 2015-08-12 DIAGNOSIS — F3340 Major depressive disorder, recurrent, in remission, unspecified: Secondary | ICD-10-CM | POA: Diagnosis not present

## 2015-08-12 DIAGNOSIS — E784 Other hyperlipidemia: Secondary | ICD-10-CM | POA: Diagnosis not present

## 2015-08-12 DIAGNOSIS — J449 Chronic obstructive pulmonary disease, unspecified: Secondary | ICD-10-CM | POA: Diagnosis not present

## 2015-08-12 DIAGNOSIS — R0989 Other specified symptoms and signs involving the circulatory and respiratory systems: Secondary | ICD-10-CM | POA: Diagnosis not present

## 2015-08-12 DIAGNOSIS — J9690 Respiratory failure, unspecified, unspecified whether with hypoxia or hypercapnia: Secondary | ICD-10-CM | POA: Diagnosis not present

## 2015-08-12 DIAGNOSIS — E119 Type 2 diabetes mellitus without complications: Secondary | ICD-10-CM | POA: Diagnosis not present

## 2015-08-12 DIAGNOSIS — G9349 Other encephalopathy: Secondary | ICD-10-CM | POA: Diagnosis not present

## 2015-08-13 DIAGNOSIS — K219 Gastro-esophageal reflux disease without esophagitis: Secondary | ICD-10-CM | POA: Diagnosis not present

## 2015-08-13 DIAGNOSIS — G939 Disorder of brain, unspecified: Secondary | ICD-10-CM | POA: Diagnosis not present

## 2015-08-13 DIAGNOSIS — J449 Chronic obstructive pulmonary disease, unspecified: Secondary | ICD-10-CM | POA: Diagnosis not present

## 2015-08-13 DIAGNOSIS — E119 Type 2 diabetes mellitus without complications: Secondary | ICD-10-CM | POA: Diagnosis not present

## 2015-08-13 NOTE — Telephone Encounter (Signed)
Called patient.  Left a message for call back.  Called niece. No answer.  Unable to leave message.  Voice mailbox full.

## 2015-08-14 ENCOUNTER — Telehealth: Payer: Self-pay | Admitting: *Deleted

## 2015-08-14 DIAGNOSIS — K219 Gastro-esophageal reflux disease without esophagitis: Secondary | ICD-10-CM | POA: Diagnosis not present

## 2015-08-14 DIAGNOSIS — R062 Wheezing: Secondary | ICD-10-CM | POA: Diagnosis not present

## 2015-08-14 DIAGNOSIS — J449 Chronic obstructive pulmonary disease, unspecified: Secondary | ICD-10-CM | POA: Diagnosis not present

## 2015-08-14 DIAGNOSIS — E119 Type 2 diabetes mellitus without complications: Secondary | ICD-10-CM | POA: Diagnosis not present

## 2015-08-14 NOTE — Telephone Encounter (Signed)
Forwarded to Dr. Blyth. JG//CMA  

## 2015-08-16 ENCOUNTER — Telehealth: Payer: Self-pay | Admitting: Physician Assistant

## 2015-08-16 DIAGNOSIS — E119 Type 2 diabetes mellitus without complications: Secondary | ICD-10-CM | POA: Diagnosis not present

## 2015-08-16 DIAGNOSIS — J449 Chronic obstructive pulmonary disease, unspecified: Secondary | ICD-10-CM | POA: Diagnosis not present

## 2015-08-16 DIAGNOSIS — K219 Gastro-esophageal reflux disease without esophagitis: Secondary | ICD-10-CM | POA: Diagnosis not present

## 2015-08-16 NOTE — Telephone Encounter (Signed)
Called and [facility] @ 4:48pm @ [phone]) asking the pt to RTC regarding the note below.//AB/CMA

## 2015-08-16 NOTE — Telephone Encounter (Signed)
Received notes from [facility] showing low O2 at night. Make sure she does not already have home oxygen. If not I am writing an order for nighttime oxygen to prevent her levels from dropping at night. [facility] will come out to set up.

## 2015-08-19 DIAGNOSIS — K219 Gastro-esophageal reflux disease without esophagitis: Secondary | ICD-10-CM | POA: Diagnosis not present

## 2015-08-19 DIAGNOSIS — E119 Type 2 diabetes mellitus without complications: Secondary | ICD-10-CM | POA: Diagnosis not present

## 2015-08-19 DIAGNOSIS — J449 Chronic obstructive pulmonary disease, unspecified: Secondary | ICD-10-CM | POA: Diagnosis not present

## 2015-08-21 ENCOUNTER — Telehealth: Payer: Self-pay | Admitting: *Deleted

## 2015-08-21 NOTE — Telephone Encounter (Signed)
Received fax from [facility] for Physician Order for Overnight Pulse Oximetry; completed and faxed to [phone], sent to scan/SLS 01/25

## 2015-08-21 NOTE — Telephone Encounter (Signed)
I have ordered a specific overnight oximetry test for patient so that insurance will cover nighttime oxygen. They will be calling her to set up.

## 2015-08-22 DIAGNOSIS — K219 Gastro-esophageal reflux disease without esophagitis: Secondary | ICD-10-CM | POA: Diagnosis not present

## 2015-08-22 DIAGNOSIS — E119 Type 2 diabetes mellitus without complications: Secondary | ICD-10-CM | POA: Diagnosis not present

## 2015-08-22 DIAGNOSIS — J449 Chronic obstructive pulmonary disease, unspecified: Secondary | ICD-10-CM | POA: Diagnosis not present

## 2015-08-26 ENCOUNTER — Encounter: Payer: Self-pay | Admitting: Family Medicine

## 2015-08-26 ENCOUNTER — Ambulatory Visit (INDEPENDENT_AMBULATORY_CARE_PROVIDER_SITE_OTHER): Payer: PPO | Admitting: Family Medicine

## 2015-08-26 VITALS — BP 132/72 | HR 86 | Temp 98.4°F | Ht 67.0 in | Wt 153.4 lb

## 2015-08-26 DIAGNOSIS — K7682 Hepatic encephalopathy: Secondary | ICD-10-CM

## 2015-08-26 DIAGNOSIS — K219 Gastro-esophageal reflux disease without esophagitis: Secondary | ICD-10-CM

## 2015-08-26 DIAGNOSIS — E782 Mixed hyperlipidemia: Secondary | ICD-10-CM | POA: Diagnosis not present

## 2015-08-26 DIAGNOSIS — J9601 Acute respiratory failure with hypoxia: Secondary | ICD-10-CM

## 2015-08-26 DIAGNOSIS — Z720 Tobacco use: Secondary | ICD-10-CM

## 2015-08-26 DIAGNOSIS — J441 Chronic obstructive pulmonary disease with (acute) exacerbation: Secondary | ICD-10-CM

## 2015-08-26 DIAGNOSIS — J449 Chronic obstructive pulmonary disease, unspecified: Secondary | ICD-10-CM

## 2015-08-26 DIAGNOSIS — K7290 Hepatic failure, unspecified without coma: Secondary | ICD-10-CM

## 2015-08-26 DIAGNOSIS — K7581 Nonalcoholic steatohepatitis (NASH): Secondary | ICD-10-CM

## 2015-08-26 DIAGNOSIS — E1142 Type 2 diabetes mellitus with diabetic polyneuropathy: Secondary | ICD-10-CM | POA: Diagnosis not present

## 2015-08-26 HISTORY — DX: Nonalcoholic steatohepatitis (NASH): K75.81

## 2015-08-26 LAB — LIPID PANEL
CHOLESTEROL: 157 mg/dL (ref 0–200)
HDL: 38.1 mg/dL — AB (ref >39.00)
LDL CALC: 107 mg/dL — AB (ref 0–99)
NonHDL: 118.73
TRIGLYCERIDES: 58 mg/dL (ref 0.0–149.0)
Total CHOL/HDL Ratio: 4
VLDL: 11.6 mg/dL (ref 0.0–40.0)

## 2015-08-26 MED ORDER — INSULIN DETEMIR 100 UNIT/ML FLEXPEN: 14.0000 [IU] | PEN_INJECTOR | Freq: Every day | SUBCUTANEOUS | Status: DC

## 2015-08-26 NOTE — Progress Notes (Signed)
Ashley Savage [id number] 03/04/1941 08/26/2015      Patient Progress Note   Subjective  Chief Complaint  Chief Complaint  Patient presents with  . Follow-up    HPI 75 yo female presents for follow up and medication verification. Went to ED on 07/31/15 for COPD exacerbation treated with Duoneb. She was admitted and treated with IV azithromycin and solumedrol.  She was d/cd after 2 days. Started on 1L of O2 at home and currently doing well with it. Uses mostly at night. Was on prednisone for 3 weeks. Sugars were uncontrolled during steroid course, endorses confusion in relation to nash cirrhosis and went to [facility]. Currently taking lactulose and is following up with specialist this Wednesday.  Was in occupational rehab and physical therapy for two weeks working on balance and daily activities. Was transitioned to just using insulin sliding scale at rehab but is now back on levemir and sugars are well controlled. States that urine started becoming orange since mid December and is occuring daily. Asked if okay to take mucinex once a day, it is. Needs refill on Levemir     Past Medical History  Diagnosis Date  . Emphysema   . Diabetes mellitus type 2  . Hyperlipidemia   . Cancer [facility]) breast ca  right  . Anxiety   . Panic attacks   . Depression   . Arthritis of both knees 10/01/2013  . Benign paroxysmal positional vertigo 10/01/2013  . Neck pain 10/01/2013  . Esophageal reflux 10/01/2013  . Pedal edema 12/10/2013  . Overactive bladder 12/10/2013  . Tobacco abuse disorder 02/01/2014  . COPD (chronic obstructive pulmonary disease) ([place]) 10/01/2013  . Serum ammonia increased ([place]) 11/10/2014  . Hyperlipidemia, mixed   . Increased ammonia level 11/25/2014  . Diarrhea 12/30/2014  . Neuropathy (HCC)     feet   . Right knee pain   . Encephalopathy, hepatic ([place]) 06/07/2014  . Abnormal finding on liver function     Past Surgical History  Procedure Laterality Date  . Gallbladder surgery   1992  . Breast surgery  2009 right  . Appendectomy  2007  . Knee surgery    . Mandible fracture surgery    . Pilonidal cyst excision    . Tonsillectomy    . Cataract extraction      x 2    Family History  Problem Relation Age of Onset  . Heart failure Father   . COPD Father   . Arthritis Father 15  . Pneumonia Sister   . Breast cancer    . Stroke Mother   . Arthritis Mother 27  . Hyperlipidemia Mother   . Hypertension Mother   . Diabetes Mother   . Breast cancer Maternal Aunt   . Alcohol abuse Maternal Uncle     Social History   Social History  . Marital Status: Single    Spouse Name: N/A  . Number of Children: 0  . Years of Education: N/A   Occupational History  . retired    Social History Main Topics  . Smoking status: Current Some Day Smoker -- 0.50 packs/day for 58 years    Types: Cigarettes    Start date: 07/27/1964  . Smokeless tobacco: Never Used     Comment: 1 pack per week  . Alcohol Use: No  . Drug Use: No  . Sexual Activity: No   Other Topics Concern  . Not on file   Social History Narrative    Current Outpatient  Prescriptions on File Prior to Visit  Medication Sig Dispense Refill  . albuterol (PROVENTIL HFA;VENTOLIN HFA) 108 (90 BASE) MCG/ACT inhaler Inhale 2 puffs into the lungs every 6 (six) hours as needed for wheezing or shortness of breath. Only dispense Ventolin (Patient taking differently: Inhale 2 puffs into the lungs every 6 (six) hours as needed for wheezing or shortness of breath. Only dispense ProAir) 1 Inhaler 3  . budesonide (PULMICORT) 0.5 MG/2ML nebulizer solution Take 2 mLs (0.5 mg total) by nebulization 2 (two) times daily. 120 mL 3  . Cholecalciferol (VITAMIN D3) 2000 UNITS TABS Take 1 capsule by mouth daily.     . furosemide (LASIX) 20 MG tablet Take 1/2 tablet daily 30 tablet 1  . glucose blood test strip Use as directed twice daily to check blood sugar.  Diagnosis code E11.9 100 each 6  . guaiFENesin (MUCINEX) 600 MG 12 hr  tablet Take 1 tablet (600 mg total) by mouth 2 (two) times daily. 40 tablet 0  . Insulin Detemir (LEVEMIR FLEXPEN) 100 UNIT/ML Pen Inject 14 Units into the skin daily at 10 pm.    . Insulin Pen Needle (PEN NEEDLES) 31G X 6 MM MISC Use as directed with Levemir flexpen. 50 each 6  . ipratropium-albuterol (DUONEB) 0.5-2.5 (3) MG/3ML SOLN Take 3 mLs by nebulization every 4 (four) hours as needed (SOB and wheezing). 360 mL 1  . lactulose (CHRONULAC) 10 GM/15ML solution Take 45 mLs (30 g total) by mouth 2 (two) times daily as needed for mild constipation. 1892 mL 1  . loratadine (CLARITIN) 10 MG tablet Take 1 tablet (10 mg total) by mouth daily. 30 tablet 1  . omeprazole (PRILOSEC) 20 MG capsule TAKE 1 CAPSULE(20 MG) BY MOUTH DAILY 90 capsule 3  . sodium chloride (OCEAN) 0.65 % SOLN nasal spray Place 1 spray into both nostrils as needed for congestion. 30 mL 0  . spironolactone (ALDACTONE) 25 MG tablet Take 1 tablet (25 mg total) by mouth daily. 30 tablet 2   No current facility-administered medications on file prior to visit.    Allergies  Allergen Reactions  . Citalopram     Irregular heart beat  . Erythromycin     Stomach cramps  . Glimepiride     Elevated ammonia levels  . Versed [Midazolam] Other (See Comments)    Patient stayed confusion stayed 4+days     Review of Systems  Constitutional: Negative for fever and malaise/fatigue.  HENT: Positive for congestion.  Eyes: Negative for discharge.  Respiratory: Positive for shortness of breath.  Cardiovascular: Negative for chest pain, palpitations and leg swelling.  Gastrointestinal: Negative for nausea, abdominal pain and diarrhea.  Genitourinary: Negative for dysuria and urgency, hematuria and flank pain. Orange urine Musculoskeletal: Negative for myalgias and falls.  Skin: Negative for rash.  Neurological: Negative for loss of consciousness and headaches.  Endo/Heme/Allergies: Negative for polydipsia.  Psychiatric/Behavioral:  Negative for depression and suicidal ideas. The patient is not nervous/anxious and does not have insomnia.   Objective  BP 132/72 mmHg  Pulse 86  Temp(Src) 98.4 F (36.9 C) (Oral)  Ht 5\' 7"  (1.702 m)  Wt 153 lb 6 oz (69.57 kg)  BMI 24.02 kg/m2  SpO2 94%  Physical Exam   Constitutional: Oriented to person, place, and time. Appears well-nourished. No distress.  Eyes: EOM are normal. Pupils are equal, round, and reactive to light.  Cardiovascular: Normal rate and regular rhythm.  Pulmonary/Chest: end expiratory wheeze Abdominal: Soft. Bowel sounds are normal.  Lymphadenopathy:  No cervical adenopathy.  Neurological: Alert and oriented to person, place, and time. Normal reflexes. No cranial nerve deficit.   Assessment & Plan  Diabetes Mellitus Type 2, controlled -HgA1c level acceptable -Minimize simple carbs, increase exercise as tolerated -Continue current meds  GERD -Avoid offending foods, start probiotics.  -Do not eat large meals in late evening and consider raising head of bed.  -Continue current medications  Nash Cirrhosis -Patient with previous history of hepatic encephalopathy. Seen by Dr.K. Denzil Magnuson , MD back in December 2016.  -Following up with them this Wednesday -Continue lactulose  Emphysema -Continue O2 at home -Recommend quitting smoking  Lab Work Ordered -Lipid panel   Patient seen with and examined with student.  Agree with documentation See separate note for further documentation

## 2015-08-26 NOTE — Assessment & Plan Note (Signed)
Has finished 3 week+ total of steroids this past month. She is set up with follow up with pulmonology. Has oxygen at home using at 1-2 liters but is not seeing an improvement in the O2 levles or her fatigue.

## 2015-08-26 NOTE — Assessment & Plan Note (Signed)
Sugars were running over 400 while on Prednisone. Is down to a better level. In am 100 to 130 the past few days, 2 hours after eating around 150

## 2015-08-26 NOTE — Patient Instructions (Signed)

## 2015-08-26 NOTE — Assessment & Plan Note (Signed)
No confusion today, her ammonia was up with copd exacerbation but she is feeling better, has an appt with GI soon for follow up

## 2015-08-26 NOTE — Progress Notes (Signed)
Pre visit review using our clinic review tool, if applicable. No additional management support is needed unless otherwise documented below in the visit note. 

## 2015-08-28 ENCOUNTER — Ambulatory Visit (INDEPENDENT_AMBULATORY_CARE_PROVIDER_SITE_OTHER): Payer: PPO | Admitting: Gastroenterology

## 2015-08-28 ENCOUNTER — Encounter: Payer: Self-pay | Admitting: Gastroenterology

## 2015-08-28 ENCOUNTER — Other Ambulatory Visit: Payer: Self-pay

## 2015-08-28 VITALS — BP 100/52 | HR 84 | Ht 65.0 in | Wt 148.4 lb

## 2015-08-28 DIAGNOSIS — K7460 Unspecified cirrhosis of liver: Secondary | ICD-10-CM | POA: Diagnosis not present

## 2015-08-28 NOTE — Patient Instructions (Signed)
You have been scheduled for an abdominal ultrasound at [facility] on 2/3/20172 at 9:30am . Please arrive 15 minutes prior to your appointment for registration. Make certain not to have anything to eat or drink after midnight prior to your appointment. Should you need to reschedule your appointment, please contact radiology at [phone]. This test typically takes about 30 minutes to perform.  Increase Spirolactone to 50 mg daily  Follow up in 3 months

## 2015-08-28 NOTE — Progress Notes (Signed)
Ashley Savage    [id number]    1940/08/16  Primary Care Physician:BLYTH, Ashley Levine, MD  Referring Physician: Mosie Lukes, MD Ashley Savage [address], [place] [address]  Chief complaint:  Ashley Savage cirrhosis  HPI: 75 year old female with history of diabetes and Ashley Savage cirrhosis here for follow-up visit Her cirrhosis is complicated by hepatic encephalopathy, she is on daily lactulose with no worsening of symptoms and has 2-3 soft bowel movements per day. Denies any abdominal distention or significant weight gain. She is on Lasix 20 mg 1-2 times a week for ankle edema and was started on spiranoactone 25 mg on last visit in December.  EGD December 2016 showed small esophageal varices and portal gastropathy She underwent abdominal ultrasound and CT abdomen and pelvis in July 2016 which did not show any focal liver lesions. She was admitted last month for bronchitis and worsening hepatic encephalopathy with confusion for 2 days Denies any melena or blood in stool. No nausea, vomiting, dysphagia, odynophagia or abdominal pain She does not drink alcohol and does not take any herbal supplements. She takes occasional naproxen for knee pain    Outpatient Encounter Prescriptions as of 08/28/2015  Medication Sig  . albuterol (ACCUNEB) 1.25 MG/3ML nebulizer solution Take 1 ampule by nebulization as needed for wheezing.  Marland Kitchen albuterol (PROVENTIL HFA;VENTOLIN HFA) 108 (90 BASE) MCG/ACT inhaler Inhale 2 puffs into the lungs every 6 (six) hours as needed for wheezing or shortness of breath. Only dispense Ventolin  . Cholecalciferol (VITAMIN D3) 2000 UNITS TABS Take 1 capsule by mouth daily.   . Fluticasone-Salmeterol (ADVAIR DISKUS) 250-50 MCG/DOSE AEPB Inhale 1 puff into the lungs 2 (two) times daily.  . furosemide (LASIX) 20 MG tablet Take 1/2 tablet daily  . glucose blood test strip Use as directed twice daily to check blood sugar.  Diagnosis code E11.9  . guaiFENesin (MUCINEX) 600 MG 12  hr tablet Take 1 tablet (600 mg total) by mouth 2 (two) times daily.  . insulin aspart (NOVOLOG) 100 UNIT/ML injection Inject into the skin. Sliding scale  . Insulin Detemir (LEVEMIR FLEXPEN) 100 UNIT/ML Pen Inject 14 Units into the skin daily at 10 pm.  . Insulin Pen Needle (PEN NEEDLES) 31G X 6 MM MISC Use as directed with Levemir flexpen.  . lactulose (CHRONULAC) 10 GM/15ML solution Take 45 mLs (30 g total) by mouth 2 (two) times daily as needed for mild constipation.  Marland Kitchen loratadine (CLARITIN) 10 MG tablet Take 1 tablet (10 mg total) by mouth daily.  Marland Kitchen omeprazole (PRILOSEC) 20 MG capsule TAKE 1 CAPSULE(20 MG) BY MOUTH DAILY  . sodium chloride (OCEAN) 0.65 % SOLN nasal spray Place 1 spray into both nostrils as needed for congestion.  Marland Kitchen spironolactone (ALDACTONE) 25 MG tablet Take 1 tablet (25 mg total) by mouth daily.  . [DISCONTINUED] budesonide (PULMICORT) 0.5 MG/2ML nebulizer solution Take 2 mLs (0.5 mg total) by nebulization 2 (two) times daily.  . [DISCONTINUED] ipratropium-albuterol (DUONEB) 0.5-2.5 (3) MG/3ML SOLN Take 3 mLs by nebulization every 4 (four) hours as needed (SOB and wheezing).   No facility-administered encounter medications on file as of 08/28/2015.    Allergies as of 08/28/2015 - Review Complete 08/28/2015  Allergen Reaction Noted  . Citalopram  04/22/2012  . Erythromycin  02/26/2011  . Glimepiride  02/03/2015  . Versed [midazolam] Other (See Comments) 10/25/2014    Past Medical History  Diagnosis Date  . Emphysema   . Diabetes mellitus  type 2  . Hyperlipidemia   . Cancer [facility]) breast ca  right  . Anxiety   . Panic attacks   . Depression   . Arthritis of both knees 10/01/2013  . Benign paroxysmal positional vertigo 10/01/2013  . Neck pain 10/01/2013  . Esophageal reflux 10/01/2013  . Pedal edema 12/10/2013  . Overactive bladder 12/10/2013  . Tobacco abuse disorder 02/01/2014  . COPD (chronic obstructive pulmonary disease) ([place]) 10/01/2013  . Serum ammonia increased  ([place]) 11/10/2014  . Hyperlipidemia, mixed   . Increased ammonia level 11/25/2014  . Diarrhea 12/30/2014  . Neuropathy (HCC)     feet   . Right knee pain   . Encephalopathy, hepatic ([place]) 06/07/2014  . Abnormal finding on liver function   . NASH (nonalcoholic steatohepatitis) 08/26/2015    Past Surgical History  Procedure Laterality Date  . Gallbladder surgery  1992  . Breast surgery  2009 right  . Appendectomy  2007  . Knee surgery    . Mandible fracture surgery    . Pilonidal cyst excision    . Tonsillectomy    . Cataract extraction      x 2    Family History  Problem Relation Age of Onset  . Heart failure Father   . COPD Father   . Arthritis Father 2  . Pneumonia Sister   . Breast cancer    . Stroke Mother   . Arthritis Mother 44  . Hyperlipidemia Mother   . Hypertension Mother   . Diabetes Mother   . Breast cancer Maternal Aunt   . Alcohol abuse Maternal Uncle     Social History   Social History  . Marital Status: Single    Spouse Name: N/A  . Number of Children: 0  . Years of Education: N/A   Occupational History  . retired    Social History Main Topics  . Smoking status: Current Some Day Smoker -- 0.50 packs/day for 58 years    Types: Cigarettes    Start date: 07/27/1964  . Smokeless tobacco: Never Used     Comment: 1 pack per week  . Alcohol Use: No  . Drug Use: No  . Sexual Activity: No   Other Topics Concern  . Not on file   Social History Narrative      Review of systems: Review of Systems  Constitutional: Negative for fever and chills.  HENT: Negative.   Eyes: Negative for blurred vision.  Respiratory: Negative for cough, shortness of breath and wheezing.   Cardiovascular: Negative for chest pain and palpitations.  Gastrointestinal: as per HPI Genitourinary: Negative for dysuria, urgency, frequency and hematuria.  Musculoskeletal: Negative for myalgias, back pain and joint pain.  Skin: Negative for itching and rash.  Neurological:  Negative for dizziness, tremors, focal weakness, seizures and loss of consciousness.  Endo/Heme/Allergies: Negative for environmental allergies.  Psychiatric/Behavioral: Negative for depression, suicidal ideas and hallucinations.  All other systems reviewed and are negative.   Physical Exam: Filed Vitals:   08/28/15 0856  BP: 100/52  Pulse: 84   Gen:      No acute distress HEENT:  EOMI, sclera anicteric Neck:     No masses; no thyromegaly Lungs:    Clear to auscultation bilaterally; normal respiratory effort CV:         Regular rate and rhythm; no murmurs Abd:      + bowel sounds; soft, non-tender; no palpable masses, no distension Ext:    2+ edema; adequate peripheral perfusion Skin:  Warm and dry; no rash Neuro: alert and oriented x 3, no asterixis Psych: normal mood and affect  Data Reviewed: EGD 06/2015 2 columns of small grade 1 esophageal varices. Non-obstructive Schatzki's ring Mild portal hypertensive gastropathy.   Assessment and Plan/Recommendations:  75 year old female with history of diabetes and Ashley Savage cirrhosis here for follow-up She is well compensated with no ascites.  Hepatic encephalopathy: Continue lactulose and titrate up or down based on symptoms Volume overload with peripheral edema: Continue Lasix 20 mg and will increase Aldactone to 50 mg daily We'll schedule for abdominal ultrasound for [place] screening Return in 3 months  K. Denzil Magnuson , MD [phone] Mon-Fri 8a-5p [phone] after 5p, weekends, holidays

## 2015-08-29 ENCOUNTER — Other Ambulatory Visit: Payer: Self-pay | Admitting: Family Medicine

## 2015-08-29 ENCOUNTER — Encounter: Payer: Self-pay | Admitting: Adult Health

## 2015-08-29 ENCOUNTER — Ambulatory Visit (INDEPENDENT_AMBULATORY_CARE_PROVIDER_SITE_OTHER): Payer: PPO | Admitting: Adult Health

## 2015-08-29 VITALS — BP 122/73 | HR 98 | Temp 97.3°F | Ht 67.0 in | Wt 149.0 lb

## 2015-08-29 DIAGNOSIS — J441 Chronic obstructive pulmonary disease with (acute) exacerbation: Secondary | ICD-10-CM | POA: Diagnosis not present

## 2015-08-29 DIAGNOSIS — Z1231 Encounter for screening mammogram for malignant neoplasm of breast: Secondary | ICD-10-CM

## 2015-08-29 DIAGNOSIS — J9611 Chronic respiratory failure with hypoxia: Secondary | ICD-10-CM | POA: Diagnosis not present

## 2015-08-29 DIAGNOSIS — J9610 Chronic respiratory failure, unspecified whether with hypoxia or hypercapnia: Secondary | ICD-10-CM | POA: Insufficient documentation

## 2015-08-29 HISTORY — DX: Chronic respiratory failure, unspecified whether with hypoxia or hypercapnia: J96.10

## 2015-08-29 MED ORDER — IPRATROPIUM-ALBUTEROL 0.5-2.5 (3) MG/3ML IN SOLN: 3.0000 mL | Freq: Four times a day (QID) | RESPIRATORY_TRACT | Status: DC

## 2015-08-29 NOTE — Progress Notes (Signed)
Subjective:    [name], female    DOB: 05-Feb-1941, 75 y.o.   MRN: [id number]  HPI  75 year old female, smoker with gold C COPD  Test Spirometry July 2014 with FEV1 of 41%, ratio 45, FVC 68%  08/29/2015 [facility] follow up  COPD-GOLD C , smoker Returns for a post hospital follow up  Hospital records reviewed  . Recently admitted [facility] for COPD flare.  tx w/  Steroids. Started Oxygen at discharge. Not wearing with activity as o2 tank is too heavy.  Readmitted to [facility] 1/9 for altered mental status felt secondary to hyperamonianemia from missed lactulose doses.  Says she is feeling some better but remains weak. Gets winded easily .  Has congested cough with white mucus.  Not wearing with activity as o2 tank is too heavy.  Walk in office pt with desat 88% on RA.  Discussed sending order for portable concentrator to her DME.-this will be lighter for  Her to carry and allow her to be more active. Patient is maintained on Advair twice daily.  Started on Duoneb at discharge, had not started this yet.  Says she quit smoking over last couple of weeks.  Flu shot is utd. PVX and Prevnar are utd.  No chest pain, orthopnea , increased edema or hemoptysis .   Says she has some chronic liver dz , followed by Hepatologist with recent  Dx of Cirrhosis -non alcoholic .      Review of Systems  Constitutional:   No  weight loss, night sweats,  Fevers, chills,  +fatigue, or  lassitude.  HEENT:   No headaches,  Difficulty swallowing,  Tooth/dental problems, or  Sore throat,                No sneezing, itching, ear ache, nasal congestion, post nasal drip,   CV:  No chest pain,  Orthopnea, PND, swelling in lower extremities, anasarca, dizziness, palpitations, syncope.   GI  No heartburn, indigestion, abdominal pain, nausea, vomiting, diarrhea, change in bowel habits, loss of appetite, bloody stools.   Resp:   No chest wall deformity  Skin: no rash or lesions.  GU: no  dysuria, change in color of urine, no urgency or frequency.  No flank pain, no hematuria   MS:  No joint pain or swelling.  No decreased range of motion.  No back pain.  Psych:  No change in mood or affect. No depression or anxiety.  No memory loss.          Objective:   Physical Exam  Filed Vitals:   08/29/15 1148  BP: 122/73  Pulse: 98  Temp: 97.3 F (36.3 C)  TempSrc: Oral  Height: 5\' 7"  (1.702 m)  Weight: 149 lb (67.586 kg)  SpO2: 94%    GEN: A/Ox3; pleasant , NAD, chronically ill appearing , frail and elderly   HEENT:  [place]/AT,  EACs-clear, TMs-wnl, NOSE-clear, THROAT-clear, no lesions, no postnasal drip or exudate noted.   NECK:  Supple w/ fair ROM; no JVD; normal carotid impulses w/o bruits; no thyromegaly or nodules palpated; no lymphadenopathy.  RESP  Decreased BS in bases , no accessory muscle use, no dullness to percussion  CARD:  RRR, no m/r/g  , tr-1+ peripheral edema, pulses intact, no cyanosis or clubbing.  GI:   Soft & nt; nml bowel sounds; no organomegaly or masses detected.  Musco: Warm bil, no deformities or joint swelling noted.   Neuro: alert, no focal deficits noted.  Skin: Warm, no lesions or rashes        Assessment & Plan:

## 2015-08-29 NOTE — Patient Instructions (Addendum)
Continue on  Advair Twice daily  , rinse after use.  Stop Budesonide Neb .  Use Duoneb Four times a day  .  Continue on Oxygen 2l/m with activity and At bedtime   Order to home care for portable concentrator.  Keep up good work with not  smoking,  Follow up with Dr. Elsworth Soho  In 2-3 months and As needed   Please contact office for sooner follow up if symptoms do not improve or worsen or seek emergency care

## 2015-08-29 NOTE — Assessment & Plan Note (Signed)
Recent flare now resolving  Encouraged on smoking cessation   Plan  Continue on  Advair Twice daily  , rinse after use.  Stop Budesonide Neb .  Use Duoneb Four times a day  .  Continue on Oxygen 2l/m with activity and At bedtime   Order to home care for portable concentrator.  Keep up good work with not  smoking,  Follow up with Dr. Elsworth Soho  In 2-3 months and As needed   Please contact office for sooner follow up if symptoms do not improve or worsen or seek emergency care

## 2015-08-29 NOTE — Assessment & Plan Note (Signed)
Compensated on O2  POC order to DME

## 2015-08-29 NOTE — Addendum Note (Signed)
Addended by: Osa Craver on: 08/29/2015 12:24 PM   Modules accepted: Orders

## 2015-08-30 ENCOUNTER — Ambulatory Visit (HOSPITAL_BASED_OUTPATIENT_CLINIC_OR_DEPARTMENT_OTHER): Payer: PPO

## 2015-09-01 NOTE — Progress Notes (Signed)
Patient ID: LORIANN BOSSERMAN, female   DOB: [birth year], 75 y.o.   MRN: [id number]   Subjective:    Patient ID: [name], female    DOB: 1940-11-21, 75 y.o.   MRN: [id number]  Chief Complaint  Patient presents with  . Follow-up    HPI Patient is in today for follow up after recent hospitalization. She was admitted with COPD exacerbation treated with Duoneb and with IV azithromycin and solumedrol.  She was d/cd after 2 days. Started on 1L of O2 at home and currently doing well with it. Uses mostly at night. Was on prednisone for 3 weeks. Sugars were uncontrolled during steroid course, endorses confusion in relation to nash cirrhosis and went to [facility]. Currently taking lactulose and is following up with specialist this Wednesday.  Was in occupational rehab and physical therapy for two weeks working on balance and daily activities. Was transitioned to just using insulin sliding scale at rehab but is now back on levemir and sugars are well controlled. Denies CP/palp/HA/congestion/fevers/GI or GU c/o. Taking meds as prescribed  Past Medical History  Diagnosis Date  . Emphysema   . Diabetes mellitus type 2  . Hyperlipidemia   . Cancer [facility]) breast ca  right  . Anxiety   . Panic attacks   . Depression   . Arthritis of both knees 10/01/2013  . Benign paroxysmal positional vertigo 10/01/2013  . Neck pain 10/01/2013  . Esophageal reflux 10/01/2013  . Pedal edema 12/10/2013  . Overactive bladder 12/10/2013  . Tobacco abuse disorder 02/01/2014  . COPD (chronic obstructive pulmonary disease) ([place]) 10/01/2013  . Serum ammonia increased ([place]) 11/10/2014  . Hyperlipidemia, mixed   . Increased ammonia level 11/25/2014  . Diarrhea 12/30/2014  . Neuropathy (HCC)     feet   . Right knee pain   . Encephalopathy, hepatic ([place]) 06/07/2014  . Abnormal finding on liver function   . NASH (nonalcoholic steatohepatitis) 08/26/2015    Past Surgical History  Procedure Laterality Date  . Gallbladder surgery   1992  . Breast surgery  2009 right  . Appendectomy  2007  . Knee surgery    . Mandible fracture surgery    . Pilonidal cyst excision    . Tonsillectomy    . Cataract extraction      x 2    Family History  Problem Relation Age of Onset  . Heart failure Father   . COPD Father   . Arthritis Father 89  . Pneumonia Sister   . Breast cancer    . Stroke Mother   . Arthritis Mother 40  . Hyperlipidemia Mother   . Hypertension Mother   . Diabetes Mother   . Breast cancer Maternal Aunt   . Alcohol abuse Maternal Uncle     Social History   Social History  . Marital Status: Single    Spouse Name: N/A  . Number of Children: 0  . Years of Education: N/A   Occupational History  . retired    Social History Main Topics  . Smoking status: Former Smoker -- 0.50 packs/day for 58 years    Types: Cigarettes    Start date: 07/27/1964    Quit date: 08/05/2015  . Smokeless tobacco: Never Used     Comment: 1 pack per week  . Alcohol Use: No  . Drug Use: No  . Sexual Activity: No   Other Topics Concern  . Not on file   Social History Narrative    Outpatient  Prescriptions Prior to Visit  Medication Sig Dispense Refill  . albuterol (PROVENTIL HFA;VENTOLIN HFA) 108 (90 BASE) MCG/ACT inhaler Inhale 2 puffs into the lungs every 6 (six) hours as needed for wheezing or shortness of breath. Only dispense Ventolin 1 Inhaler 3  . Cholecalciferol (VITAMIN D3) 2000 UNITS TABS Take 1 capsule by mouth daily.     . furosemide (LASIX) 20 MG tablet Take 1/2 tablet daily 30 tablet 1  . glucose blood test strip Use as directed twice daily to check blood sugar.  Diagnosis code E11.9 100 each 6  . guaiFENesin (MUCINEX) 600 MG 12 hr tablet Take 1 tablet (600 mg total) by mouth 2 (two) times daily. 40 tablet 0  . Insulin Pen Needle (PEN NEEDLES) 31G X 6 MM MISC Use as directed with Levemir flexpen. 50 each 6  . lactulose (CHRONULAC) 10 GM/15ML solution Take 45 mLs (30 g total) by mouth 2 (two) times  daily as needed for mild constipation. 1892 mL 1  . omeprazole (PRILOSEC) 20 MG capsule TAKE 1 CAPSULE(20 MG) BY MOUTH DAILY 90 capsule 3  . sodium chloride (OCEAN) 0.65 % SOLN nasal spray Place 1 spray into both nostrils as needed for congestion. 30 mL 0  . spironolactone (ALDACTONE) 25 MG tablet Take 1 tablet (25 mg total) by mouth daily. (Patient taking differently: Take 2 tablets by mouth daily) 30 tablet 2  . budesonide (PULMICORT) 0.5 MG/2ML nebulizer solution Take 2 mLs (0.5 mg total) by nebulization 2 (two) times daily. 120 mL 3  . Insulin Detemir (LEVEMIR FLEXPEN) 100 UNIT/ML Pen Inject 14 Units into the skin daily at 10 pm.    . ipratropium-albuterol (DUONEB) 0.5-2.5 (3) MG/3ML SOLN Take 3 mLs by nebulization every 4 (four) hours as needed (SOB and wheezing). 360 mL 1  . loratadine (CLARITIN) 10 MG tablet Take 1 tablet (10 mg total) by mouth daily. (Patient not taking: Reported on 08/29/2015) 30 tablet 1  . predniSONE (DELTASONE) 20 MG tablet Take 2 tablets by mouth X 2 days; then 1 tablet by mouth X 3 days; then 1/2 tablet by mouth X 3 days and stop prednisone 10 tablet 0   No facility-administered medications prior to visit.    Allergies  Allergen Reactions  . Citalopram     Irregular heart beat  . Erythromycin     Stomach cramps  . Glimepiride     Elevated ammonia levels  . Versed [Midazolam] Other (See Comments)    Patient stayed confusion stayed 4+days     Review of Systems  Constitutional: Positive for malaise/fatigue. Negative for fever.  HENT: Negative for congestion.   Eyes: Negative for discharge.  Respiratory: Positive for shortness of breath.   Cardiovascular: Negative for chest pain, palpitations and leg swelling.  Gastrointestinal: Negative for nausea and abdominal pain.  Genitourinary: Negative for dysuria.  Musculoskeletal: Negative for falls.  Skin: Negative for rash.  Neurological: Negative for loss of consciousness and headaches.  Endo/Heme/Allergies:  Negative for environmental allergies.  Psychiatric/Behavioral: Negative for depression. The patient is not nervous/anxious.        Objective:    Physical Exam  Constitutional: She is oriented to person, place, and time. She appears well-developed and well-nourished. No distress.  HENT:  Head: Normocephalic and atraumatic.  Nose: Nose normal.  Eyes: Right eye exhibits no discharge. Left eye exhibits no discharge.  Neck: Normal range of motion. Neck supple.  Cardiovascular: Normal rate and regular rhythm.   No murmur heard. Pulmonary/Chest: Effort normal and breath sounds  normal.  Abdominal: Soft. Bowel sounds are normal. There is no tenderness.  Musculoskeletal: She exhibits no edema.  Neurological: She is alert and oriented to person, place, and time.  Skin: Skin is warm and dry.  Psychiatric: She has a normal mood and affect.  Nursing note and vitals reviewed.   BP 132/72 mmHg  Pulse 86  Temp(Src) 98.4 F (36.9 C) (Oral)  Ht '5\' 7"'  (1.702 m)  Wt 153 lb 6 oz (69.57 kg)  BMI 24.02 kg/m2  SpO2 94% Wt Readings from Last 3 Encounters:  08/29/15 149 lb (67.586 kg)  08/28/15 148 lb 6 oz (67.302 kg)  08/26/15 153 lb 6 oz (69.57 kg)     Lab Results  Component Value Date   WBC 9.3 07/30/2015   HGB 15.5* 07/30/2015   HCT 45.4 07/30/2015   PLT 123* 07/30/2015   GLUCOSE 194* 07/31/2015   CHOL 157 08/26/2015   TRIG 58.0 08/26/2015   HDL 38.10* 08/26/2015   LDLCALC 107* 08/26/2015   ALT 28 07/31/2015   AST 27 07/31/2015   NA 137 07/31/2015   K 4.7 07/31/2015   CL 101 07/31/2015   CREATININE 0.77 07/31/2015   BUN 23* 07/31/2015   CO2 27 07/31/2015   TSH 0.94 03/26/2015   HGBA1C 6.5 07/03/2015   MICROALBUR <0.7 03/26/2015    Lab Results  Component Value Date   TSH 0.94 03/26/2015   Lab Results  Component Value Date   WBC 9.3 07/30/2015   HGB 15.5* 07/30/2015   HCT 45.4 07/30/2015   MCV 88.5 07/30/2015   PLT 123* 07/30/2015   Lab Results  Component Value  Date   NA 137 07/31/2015   K 4.7 07/31/2015   CHLORIDE 98 09/26/2014   CO2 27 07/31/2015   GLUCOSE 194* 07/31/2015   BUN 23* 07/31/2015   CREATININE 0.77 07/31/2015   BILITOT 2.2* 07/31/2015   ALKPHOS 96 07/31/2015   AST 27 07/31/2015   ALT 28 07/31/2015   PROT 5.7* 07/31/2015   ALBUMIN 3.0* 07/31/2015   CALCIUM 8.9 07/31/2015   ANIONGAP 9 07/31/2015   EGFR 77* 09/26/2014   GFR 98.74 03/26/2015   Lab Results  Component Value Date   CHOL 157 08/26/2015   Lab Results  Component Value Date   HDL 38.10* 08/26/2015   Lab Results  Component Value Date   LDLCALC 107* 08/26/2015   Lab Results  Component Value Date   TRIG 58.0 08/26/2015   Lab Results  Component Value Date   CHOLHDL 4 08/26/2015   Lab Results  Component Value Date   HGBA1C 6.5 07/03/2015       Assessment & Plan:   Problem List Items Addressed This Visit    Acute respiratory failure with hypoxia ([place])   Relevant Orders   Ambulatory referral to Pulmonology   COPD exacerbation ([place])    Has finished 3 week+ total of steroids this past month. She is set up with follow up with pulmonology. Has oxygen at home using at 1-2 liters but is not seeing an improvement in the O2 levles or her fatigue.       Relevant Orders   Ambulatory referral to Pulmonology   Diabetes mellitus type 2, controlled ([place])    Sugars were running over 400 while on Prednisone. Is down to a better level. In am 100 to 130 the past few days, 2 hours after eating around 150      Relevant Medications   Insulin Detemir (LEVEMIR FLEXPEN) 100 UNIT/ML Pen  Encephalopathy, hepatic (HCC)    No confusion today, her ammonia was up with copd exacerbation but she is feeling better, has an appt with GI soon for follow up       Esophageal reflux    Avoid offending foods, start probiotics. Do not eat large meals in late evening and consider raising head of bed.       Hyperlipidemia, mixed - Primary   Relevant Orders   Lipid panel  (Completed)   NASH (nonalcoholic steatohepatitis)    Following now with [facility] gastro[facility]ology. Minimize simple carbs and fatty foods.       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    Recent exacerbation has flared her NASH and elevated ammonia levels. She did experience some confusion but is now back at her baseline after returning home from the hospital      Tobacco abuse disorder    Encouraged complete cessation. Discussed need to quit as relates to risk of numerous cancers, cardiac and pulmonary disease as well as neurologic complications. Counseled for greater than 3 minutes         I have discontinued Ms. Vawter predniSONE. I am also having her maintain her Vitamin D3, albuterol, glucose blood, Pen Needles, furosemide, spironolactone, lactulose, omeprazole, guaiFENesin, sodium chloride, and Insulin Detemir.  Meds ordered this encounter  Medications  . Insulin Detemir (LEVEMIR FLEXPEN) 100 UNIT/ML Pen    Sig: Inject 14 Units into the skin daily at 10 pm.    Dispense:  15 mL    Refill:  5     BLYTH, STACEY, MD

## 2015-09-01 NOTE — Assessment & Plan Note (Signed)
Avoid offending foods, start probiotics. Do not eat large meals in late evening and consider raising head of bed.  

## 2015-09-01 NOTE — Assessment & Plan Note (Signed)
Encouraged complete cessation. Discussed need to quit as relates to risk of numerous cancers, cardiac and pulmonary disease as well as neurologic complications. Counseled for greater than 3 minutes 

## 2015-09-01 NOTE — Assessment & Plan Note (Signed)
Recent exacerbation has flared her NASH and elevated ammonia levels. She did experience some confusion but is now back at her baseline after returning home from the hospital

## 2015-09-01 NOTE — Assessment & Plan Note (Signed)
Following now with [facility]. Minimize simple carbs and fatty foods.

## 2015-09-02 NOTE — Progress Notes (Signed)
Reviewed & agree with plan  

## 2015-09-04 ENCOUNTER — Other Ambulatory Visit (HOSPITAL_BASED_OUTPATIENT_CLINIC_OR_DEPARTMENT_OTHER): Payer: PPO

## 2015-09-05 ENCOUNTER — Ambulatory Visit (HOSPITAL_BASED_OUTPATIENT_CLINIC_OR_DEPARTMENT_OTHER)
Admission: RE | Admit: 2015-09-05 | Discharge: 2015-09-05 | Disposition: A | Discharge status: Home / Self Care | Payer: PPO | Source: Ambulatory Visit | Attending: Family Medicine | Admitting: Family Medicine

## 2015-09-05 ENCOUNTER — Ambulatory Visit (HOSPITAL_BASED_OUTPATIENT_CLINIC_OR_DEPARTMENT_OTHER)
Admission: RE | Admit: 2015-09-05 | Discharge: 2015-09-05 | Disposition: A | Discharge status: Home / Self Care | Payer: PPO | Source: Ambulatory Visit | Attending: Gastroenterology | Admitting: Gastroenterology

## 2015-09-05 DIAGNOSIS — Z1231 Encounter for screening mammogram for malignant neoplasm of breast: Secondary | ICD-10-CM | POA: Insufficient documentation

## 2015-09-05 DIAGNOSIS — R921 Mammographic calcification found on diagnostic imaging of breast: Secondary | ICD-10-CM | POA: Diagnosis not present

## 2015-09-05 DIAGNOSIS — R932 Abnormal findings on diagnostic imaging of liver and biliary tract: Secondary | ICD-10-CM | POA: Insufficient documentation

## 2015-09-05 DIAGNOSIS — Z9049 Acquired absence of other specified parts of digestive tract: Secondary | ICD-10-CM | POA: Insufficient documentation

## 2015-09-05 DIAGNOSIS — I714 Abdominal aortic aneurysm, without rupture: Secondary | ICD-10-CM | POA: Insufficient documentation

## 2015-09-05 DIAGNOSIS — K7460 Unspecified cirrhosis of liver: Secondary | ICD-10-CM | POA: Diagnosis not present

## 2015-09-06 ENCOUNTER — Other Ambulatory Visit: Payer: Self-pay | Admitting: Family Medicine

## 2015-09-06 DIAGNOSIS — R928 Other abnormal and inconclusive findings on diagnostic imaging of breast: Secondary | ICD-10-CM

## 2015-09-08 ENCOUNTER — Encounter: Payer: Self-pay | Admitting: Gastroenterology

## 2015-09-09 ENCOUNTER — Telehealth: Payer: Self-pay | Admitting: *Deleted

## 2015-09-09 NOTE — Telephone Encounter (Signed)
Received physician orders form provider; faxed & sent to scan/[facility] 02/10

## 2015-09-17 ENCOUNTER — Telehealth: Payer: Self-pay | Admitting: Adult Health

## 2015-09-17 DIAGNOSIS — J449 Chronic obstructive pulmonary disease, unspecified: Secondary | ICD-10-CM

## 2015-09-17 DIAGNOSIS — J9611 Chronic respiratory failure with hypoxia: Secondary | ICD-10-CM

## 2015-09-17 NOTE — Telephone Encounter (Signed)
Received Pulse oximetry testing results ( oxygen titration report) from [facility].   Per verbal order from TP after reviewing form Order for POC at 2L  Order has been placed. Nothing further needed at this time.

## 2015-09-25 ENCOUNTER — Ambulatory Visit (HOSPITAL_BASED_OUTPATIENT_CLINIC_OR_DEPARTMENT_OTHER): Payer: PPO | Admitting: Hematology & Oncology

## 2015-09-25 ENCOUNTER — Other Ambulatory Visit (HOSPITAL_BASED_OUTPATIENT_CLINIC_OR_DEPARTMENT_OTHER): Payer: PPO

## 2015-09-25 ENCOUNTER — Encounter: Payer: Self-pay | Admitting: Hematology & Oncology

## 2015-09-25 VITALS — BP 129/47 | HR 88 | Temp 97.6°F | Resp 20 | Ht 67.0 in | Wt 150.0 lb

## 2015-09-25 DIAGNOSIS — Z853 Personal history of malignant neoplasm of breast: Secondary | ICD-10-CM

## 2015-09-25 DIAGNOSIS — D696 Thrombocytopenia, unspecified: Secondary | ICD-10-CM | POA: Diagnosis not present

## 2015-09-25 DIAGNOSIS — C50911 Malignant neoplasm of unspecified site of right female breast: Secondary | ICD-10-CM

## 2015-09-25 DIAGNOSIS — C50019 Malignant neoplasm of nipple and areola, unspecified female breast: Secondary | ICD-10-CM

## 2015-09-25 LAB — COMPREHENSIVE METABOLIC PANEL
ALT: 23 U/L (ref 0–55)
ANION GAP: 7 meq/L (ref 3–11)
AST: 34 U/L (ref 5–34)
Albumin: 2.3 g/dL — ABNORMAL LOW (ref 3.5–5.0)
Alkaline Phosphatase: 109 U/L (ref 40–150)
BILIRUBIN TOTAL: 2 mg/dL — AB (ref 0.20–1.20)
BUN: 12.5 mg/dL (ref 7.0–26.0)
CALCIUM: 8.4 mg/dL (ref 8.4–10.4)
CO2: 25 mEq/L (ref 22–29)
CREATININE: 0.9 mg/dL (ref 0.6–1.1)
Chloride: 104 mEq/L (ref 98–109)
EGFR: 66 mL/min/{1.73_m2} — ABNORMAL LOW (ref >90)
Glucose: 299 mg/dl — ABNORMAL HIGH (ref 70–140)
Potassium: 4.5 mEq/L (ref 3.5–5.1)
Sodium: 136 mEq/L (ref 136–145)
TOTAL PROTEIN: 5.9 g/dL — AB (ref 6.4–8.3)

## 2015-09-25 LAB — CBC WITH DIFFERENTIAL (CANCER CENTER ONLY)
BASO#: 0.1 10*3/uL (ref 0.0–0.2)
BASO%: 1.1 % (ref 0.0–2.0)
EOS%: 4 % (ref 0.0–7.0)
Eosinophils Absolute: 0.2 10*3/uL (ref 0.0–0.5)
HCT: 38.7 % (ref 34.8–46.6)
HGB: 13.3 g/dL (ref 11.6–15.9)
LYMPH#: 1.5 10*3/uL (ref 0.9–3.3)
LYMPH%: 28.9 % (ref 14.0–48.0)
MCH: 31.8 pg (ref 26.0–34.0)
MCHC: 34.4 g/dL (ref 32.0–36.0)
MCV: 93 fL (ref 81–101)
MONO#: 0.6 10*3/uL (ref 0.1–0.9)
MONO%: 11.9 % (ref 0.0–13.0)
NEUT#: 2.9 10*3/uL (ref 1.5–6.5)
NEUT%: 54.1 % (ref 39.6–80.0)
PLATELETS: 112 10*3/uL — AB (ref 145–400)
RBC: 4.18 10*6/uL (ref 3.70–5.32)
RDW: 15 % (ref 11.1–15.7)
WBC: 5.3 10*3/uL (ref 3.9–10.0)

## 2015-09-25 LAB — LACTATE DEHYDROGENASE: LDH: 211 U/L (ref 125–245)

## 2015-09-25 NOTE — Progress Notes (Signed)
  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 We last saw her back in February. Unfortunately, she has an exacerbation of her COPD. She is pretty bad emphysema. She has nebulizers at home. She underwent a chest x-ray. This was done in the emergency room. There was no evidence of fluid or obvious pneumonia.  She has problems with NASH. She's had hepatic encephalopathy in the past. This started last year. She does see a gastroenterologist to try to help out. She is on lactulose to help with encephalopathic issues.    She unfortunately has had issues with her family. She moved to the area to be with her family. Again, there have been issues with respect to several family members that have been incredibly irritating.    Otherwise, she seems to be doing okay. She is a little "jittery" from the prednisone.   She's had no bleeding. She's had no nausea vomiting.  Her last mammogram was in December of 2016. This was unremarkable. That was one to do a another mammogram because of her dense breasts. She really does not want have this done.  PHYSICAL EXAMINATION: Slightly ill-appearing white female in no obvious distress. She is coughing quite a bit. Her vital signs show temperature of 98 pulse is 90 expiratory rate is 16 and blood pressure 131/48. Weight is 172. Head and neck exam shows no ocular or oral lesions. She has no adenopathy in the neck. Lungs show good breath sounds bilaterally. Some wheezes are noted. Cardiac exam regular rate and rhythm with no murmurs rubs or bruits. Abdomen is soft. Has good bowel sounds. There is no fluid wave. There is no palpable liver or spleen. Breast exam shows left breast with no masses edema  or erythema. There is no left axillary adenopathy. Right chest wall shows well healed mastectomy. No nodules or erythema is noted on the right chest wall. There is no right axillary adenopathy. Extremities shows no clubbing cyanosis or edema. No lymphedema is noted in the right arm. Skin exam is unremarkable.   LABORATORY STUDIES:  White cell count 5.4. Hemoglobin 13.4. Platelet count 117.   IMPRESSION: She is a 75 year old white female with history of stage II a ductal carcinoma of the right breast. Her tumor is ER positive. She has completed Femara for 5 years.  We will continue to follow her along every year.. I do not see need for any additional labs or x-rays.  I think that her lung disease will be the main determinate of her prognosis.    Volanda Napoleon, MD 09/25/2015

## 2015-10-10 ENCOUNTER — Encounter: Payer: Self-pay | Admitting: Adult Health

## 2015-10-16 ENCOUNTER — Other Ambulatory Visit: Payer: Self-pay | Admitting: Critical Care Medicine

## 2015-10-16 ENCOUNTER — Other Ambulatory Visit: Payer: Self-pay | Admitting: Family Medicine

## 2015-10-22 ENCOUNTER — Encounter: Payer: Self-pay | Admitting: Gastroenterology

## 2015-10-22 ENCOUNTER — Other Ambulatory Visit: Payer: Self-pay

## 2015-10-22 ENCOUNTER — Telehealth: Payer: Self-pay | Admitting: Family Medicine

## 2015-10-22 MED ORDER — SPIRONOLACTONE 25 MG PO TABS: 50.0000 mg | ORAL_TABLET | Freq: Every day | ORAL | Status: DC

## 2015-10-22 NOTE — Telephone Encounter (Signed)
Looks like she had her annual wellness with [place] on 02/08/2015.  Please advise if this is correct and is what she is needing to know.

## 2015-10-22 NOTE — Telephone Encounter (Signed)
Relation to [id number] Call back number:[phone]   Reason for call:  Patient would like to know when her last CPE was conducted please advise

## 2015-10-22 NOTE — Telephone Encounter (Signed)
So lead nurse did a AWV on 02/08/2015 no CPE with [place] recently, if she has team health advantage her insurance she can also have a true CPE with MD in next 4-6 months

## 2015-10-23 NOTE — Telephone Encounter (Signed)
Patient scheduled physical appointment for 02/03/2016. Patient states she changed insurance effective date 07/2015

## 2015-11-04 ENCOUNTER — Other Ambulatory Visit: Payer: Self-pay | Admitting: Gastroenterology

## 2015-11-06 DIAGNOSIS — M25561 Pain in right knee: Secondary | ICD-10-CM | POA: Diagnosis not present

## 2015-11-06 DIAGNOSIS — R262 Difficulty in walking, not elsewhere classified: Secondary | ICD-10-CM | POA: Diagnosis not present

## 2015-11-06 DIAGNOSIS — M170 Bilateral primary osteoarthritis of knee: Secondary | ICD-10-CM | POA: Diagnosis not present

## 2015-11-07 ENCOUNTER — Encounter: Payer: Self-pay | Admitting: Pulmonary Disease

## 2015-11-07 ENCOUNTER — Ambulatory Visit (INDEPENDENT_AMBULATORY_CARE_PROVIDER_SITE_OTHER): Payer: PPO | Admitting: Pulmonary Disease

## 2015-11-07 VITALS — BP 112/70 | HR 83 | Ht 67.0 in | Wt 150.0 lb

## 2015-11-07 DIAGNOSIS — J449 Chronic obstructive pulmonary disease, unspecified: Secondary | ICD-10-CM | POA: Diagnosis not present

## 2015-11-07 DIAGNOSIS — J9611 Chronic respiratory failure with hypoxia: Secondary | ICD-10-CM

## 2015-11-07 DIAGNOSIS — J441 Chronic obstructive pulmonary disease with (acute) exacerbation: Secondary | ICD-10-CM | POA: Diagnosis not present

## 2015-11-07 MED ORDER — UMECLIDINIUM-VILANTEROL 62.5-25 MCG/INH IN AEPB: 1.0000 | INHALATION_SPRAY | Freq: Every day | RESPIRATORY_TRACT | Status: DC

## 2015-11-07 NOTE — Progress Notes (Signed)
   Subjective:    [name], female    DOB: Oct 23, 1940, 75 y.o.   MRN: [id number]  HPI  75 year old female, smoker with gold C COPD NASH - (sees Dr Silverio Decamp) H/o breast CA - ennever, also thrombocytopenia  11/07/2015  Chief Complaint  Patient presents with  . Follow-up    breathing is doing well. discuss getting rid of oxygen, has not used in 2 months during the day.  Unable to use O2 at night because she gets nasal congestion every night.     1/2017admitted [place] for COPD flare.  tx w/  Steroids. Started Oxygen at discharge.  Ad[facility] to [facility]/05/15 for altered mental status felt secondary to hyperamonianemia from missed lactulose doses.    Gets winded easily . Also limited by bad knees  Cough better Smokes an occasional cig/ wk   Last visit  -Walk in office pt with desat 88% on RA.  Does not use O2 daytime, uses during sleep rarely when head is 'not stopped up'  Compliant with  Advair twice daily.  Does not use duonebs  No chest pain, orthopnea , increased edema or hemoptysis .   .  07/2015 CXR - hyperinflated, no infx 08/2015 ONO >> 2L [place]  Test Spirometry July 2014 with FEV1 of 41%, ratio 45, FVC 68% Spirometry 10/2015 shows FEV1 53%, ratio 61  Past Medical History  Diagnosis Date  . Emphysema   . Diabetes mellitus type 2  . Hyperlipidemia   . Cancer [facility]) breast ca  right  . Anxiety   . Panic attacks   . Depression   . Arthritis of both knees 10/01/2013  . Benign paroxysmal positional vertigo 10/01/2013  . Neck pain 10/01/2013  . Esophageal reflux 10/01/2013  . Pedal edema 12/10/2013  . Overactive bladder 12/10/2013  . Tobacco abuse disorder 02/01/2014  . COPD (chronic obstructive pulmonary disease) ([employer]) 10/01/2013  . Serum ammonia increased ([place]) 11/10/2014  . Hyperlipidemia, mixed   . Increased ammonia level 11/25/2014  . Diarrhea 12/30/2014  . Neuropathy (HCC)     feet   . Right knee pain   . Encephalopathy, hepatic ([place]) 06/07/2014  . Abnormal  finding on liver function   . NASH (nonalcoholic steatohepatitis) 08/26/2015     Review of Systems  Patient denies significant dyspnea,cough, hemoptysis,  chest pain, palpitations, pedal edema, orthopnea, paroxysmal nocturnal dyspnea, lightheadedness, nausea, vomiting, abdominal or  leg pains      Objective:   Physical Exam  Gen. Pleasant, well-nourished, in no distress ENT - no lesions, no post nasal drip Neck: No JVD, no thyromegaly, no carotid bruits Lungs: no use of accessory muscles, no dullness to percussion, faint scatteed rhonchi  Cardiovascular: Rhythm regular, heart sounds  normal, no murmurs or gallops, no peripheral edema Musculoskeletal: No deformities, no cyanosis or clubbing        Assessment & Plan:

## 2015-11-07 NOTE — Patient Instructions (Addendum)
Discontinue oxygen You have to QUIT smoking completely  Trial of Anoro once daily -instead of advair  Call for Rx if this works

## 2015-11-07 NOTE — Assessment & Plan Note (Signed)
Discontinue oxygen

## 2015-11-07 NOTE — Assessment & Plan Note (Addendum)
  You have to QUIT smoking completely  Lung function is actually stable to improved Trial of Anoro once daily -instead of advair  Call for Rx if this works

## 2015-11-08 DIAGNOSIS — J209 Acute bronchitis, unspecified: Secondary | ICD-10-CM | POA: Diagnosis not present

## 2015-11-12 ENCOUNTER — Telehealth: Payer: Self-pay | Admitting: Family Medicine

## 2015-11-12 NOTE — Telephone Encounter (Signed)
Called patient per PCP instructions regarding [facility] requesting further imaging and they had not been able to contact the patient. Did speak to the patient and she is ok to have further imaging done, but no [place].  PCP informed.

## 2015-11-14 DIAGNOSIS — M25561 Pain in right knee: Secondary | ICD-10-CM | POA: Diagnosis not present

## 2015-11-14 DIAGNOSIS — M170 Bilateral primary osteoarthritis of knee: Secondary | ICD-10-CM | POA: Diagnosis not present

## 2015-11-14 DIAGNOSIS — M1711 Unilateral primary osteoarthritis, right knee: Secondary | ICD-10-CM | POA: Diagnosis not present

## 2015-11-18 ENCOUNTER — Encounter: Payer: Self-pay | Admitting: Family Medicine

## 2015-11-20 ENCOUNTER — Encounter: Payer: Self-pay | Admitting: Podiatry

## 2015-11-20 ENCOUNTER — Ambulatory Visit (INDEPENDENT_AMBULATORY_CARE_PROVIDER_SITE_OTHER): Payer: Self-pay | Admitting: Podiatry

## 2015-11-20 ENCOUNTER — Telehealth: Payer: Self-pay | Admitting: Family Medicine

## 2015-11-20 VITALS — BP 116/70 | HR 81 | Resp 18

## 2015-11-20 DIAGNOSIS — E11610 Type 2 diabetes mellitus with diabetic neuropathic arthropathy: Secondary | ICD-10-CM

## 2015-11-20 NOTE — Telephone Encounter (Signed)
error:[id number] ° °

## 2015-11-22 NOTE — Progress Notes (Signed)
Subjective:     [name], female   DOB: 03-14-1941, 75 y.o.   MRN: [id number]  HPI 75 year old female presents the office today for concerns of neuropathy to her feet. She states that she would stay off of oral medication possible. She states she bruises in some burning pain but now she does have numbness to her feet. Denies any open sores. No swelling. No claudication symptoms.  Review of Systems  All other systems reviewed and are negative.      Objective:   Physical Exam General: AAO x3, NAD  Dermatological: Skin is warm, dry and supple bilateral. Nails x 10 are well manicured; remaining integument appears unremarkable at this time. There are no open sores, no preulcerative lesions, no rash or signs of infection present.  Vascular: Dorsalis Pedis artery and Posterior Tibial artery pedal pulses are 2/4 bilateral with immedate capillary fill time. Pedal hair growth present. No varicosities and no lower extremity edema present bilateral. There is no pain with calf compression, swelling, warmth, erythema.   Neruologic: Although she has subjective numbness and tingling to her feet her sensation appears to be intact with Derrel Nip monofilament vibratory sensation and Achilles reflex intact.   Musculoskeletal: Mild hammertoes present. No pain, crepitus, or limitation noted with foot and ankle range of motion bilateral. Muscular strength 5/5 in all groups tested bilateral.  Gait: Unassisted, Nonantalgic.      Assessment:     Neuropathy    Plan:     -Treatment options discussed including all alternatives, risks, and complications -Etiology of symptoms were discussed -I long discussion the patient regards to treatment on September neuropathy. This time she elects to proceed with topical treatment. Given her other medical conditions she would stay off of oral therapy if possible. Prescribed compound cream for neuropathy. -Follow-up in 3-6 months or sooner if any  worsening  Celesta Gentile, DPM

## 2015-11-25 LAB — HM DIABETES FOOT EXAM: HM Diabetic Foot Exam: NORMAL

## 2015-11-28 ENCOUNTER — Encounter: Payer: Self-pay | Admitting: Pulmonary Disease

## 2015-11-28 ENCOUNTER — Other Ambulatory Visit: Payer: Self-pay | Admitting: Family Medicine

## 2015-11-28 DIAGNOSIS — M25561 Pain in right knee: Secondary | ICD-10-CM | POA: Diagnosis not present

## 2015-11-28 DIAGNOSIS — M1711 Unilateral primary osteoarthritis, right knee: Secondary | ICD-10-CM | POA: Diagnosis not present

## 2015-12-03 ENCOUNTER — Other Ambulatory Visit: Payer: Self-pay | Admitting: Family Medicine

## 2015-12-05 DIAGNOSIS — M25561 Pain in right knee: Secondary | ICD-10-CM | POA: Diagnosis not present

## 2015-12-05 DIAGNOSIS — R2689 Other abnormalities of gait and mobility: Secondary | ICD-10-CM | POA: Diagnosis not present

## 2015-12-05 DIAGNOSIS — M1711 Unilateral primary osteoarthritis, right knee: Secondary | ICD-10-CM | POA: Diagnosis not present

## 2015-12-06 ENCOUNTER — Telehealth: Payer: Self-pay | Admitting: *Deleted

## 2015-12-06 MED ORDER — NONFORMULARY OR COMPOUNDED ITEM: Status: DC

## 2015-12-06 NOTE — Telephone Encounter (Signed)
Dr. Jacqualyn Posey ordered [place] Peripheral Neuropathy compound. Faxed.

## 2015-12-09 ENCOUNTER — Encounter: Payer: Self-pay | Admitting: Gastroenterology

## 2015-12-10 ENCOUNTER — Telehealth: Payer: Self-pay

## 2015-12-10 NOTE — Telephone Encounter (Signed)
Spoke with the patient about the LE edema. She notes the left leg is worse and she has weeping of the skin. She has not been wearing any support hose. Legs are normal in the mornings but swell quickly after getting up. Breathing is at her baseline. Confirmed she takes Lasix 10 mg daily and Aldactone 50 mg daily. Please advise.

## 2015-12-11 ENCOUNTER — Encounter: Payer: Self-pay | Admitting: Podiatry

## 2015-12-11 ENCOUNTER — Other Ambulatory Visit: Payer: Self-pay

## 2015-12-11 ENCOUNTER — Other Ambulatory Visit (INDEPENDENT_AMBULATORY_CARE_PROVIDER_SITE_OTHER): Payer: PPO

## 2015-12-11 DIAGNOSIS — K769 Liver disease, unspecified: Secondary | ICD-10-CM

## 2015-12-11 DIAGNOSIS — R609 Edema, unspecified: Secondary | ICD-10-CM

## 2015-12-11 LAB — COMPREHENSIVE METABOLIC PANEL
ALK PHOS: 115 U/L (ref 39–117)
ALT: 25 U/L (ref 0–35)
AST: 34 U/L (ref 0–37)
Albumin: 2.9 g/dL — ABNORMAL LOW (ref 3.5–5.2)
BILIRUBIN TOTAL: 2.2 mg/dL — AB (ref 0.2–1.2)
BUN: 12 mg/dL (ref 6–23)
CO2: 31 mEq/L (ref 19–32)
CREATININE: 0.77 mg/dL (ref 0.40–1.20)
Calcium: 9.2 mg/dL (ref 8.4–10.5)
Chloride: 104 mEq/L (ref 96–112)
GFR: 78.17 mL/min (ref >60.00)
GLUCOSE: 168 mg/dL — AB (ref 70–99)
Potassium: 3.7 mEq/L (ref 3.5–5.1)
SODIUM: 138 meq/L (ref 135–145)
TOTAL PROTEIN: 5.7 g/dL — AB (ref 6.0–8.3)

## 2015-12-11 LAB — CBC WITH DIFFERENTIAL/PLATELET
BASOS ABS: 0 10*3/uL (ref 0.0–0.1)
Basophils Relative: 0.7 % (ref 0.0–3.0)
EOS ABS: 0.2 10*3/uL (ref 0.0–0.7)
EOS PCT: 3.6 % (ref 0.0–5.0)
HCT: 42.6 % (ref 36.0–46.0)
HEMOGLOBIN: 14.1 g/dL (ref 12.0–15.0)
Lymphocytes Relative: 31 % (ref 12.0–46.0)
Lymphs Abs: 1.6 10*3/uL (ref 0.7–4.0)
MCHC: 33.2 g/dL (ref 30.0–36.0)
MCV: 95.9 fl (ref 78.0–100.0)
Monocytes Absolute: 0.9 10*3/uL (ref 0.1–1.0)
Monocytes Relative: 17.1 % — ABNORMAL HIGH (ref 3.0–12.0)
NEUTROS PCT: 47.6 % (ref 43.0–77.0)
Neutro Abs: 2.5 10*3/uL (ref 1.4–7.7)
Platelets: 121 10*3/uL — ABNORMAL LOW (ref 150.0–400.0)
RBC: 4.45 Mil/uL (ref 3.87–5.11)
RDW: 15.2 % (ref 11.5–15.5)
WBC: 5.2 10*3/uL (ref 4.0–10.5)

## 2015-12-11 LAB — PROTIME-INR
INR: 1.2 ratio — AB (ref 0.8–1.0)
PROTHROMBIN TIME: 13.1 s (ref 9.6–13.1)

## 2015-12-11 NOTE — Telephone Encounter (Signed)
There are no openings or APP appointments. She is having labs done today. She is carefully instructed on what to take in order to have the correct dosage of medications. Also discussed elevated her legs several times during the day. Skin is dry today. Edema described still sounds like it is pitting. She is going to the [facility] office to ask if they will draw the labs. This has been done for her before.

## 2015-12-11 NOTE — Telephone Encounter (Signed)
Her labs are in for review.

## 2015-12-11 NOTE — Telephone Encounter (Signed)
Please try to bring her in for office visit. CBC, CMP and PT/INR today. Increase Lasix to 40mg  and Aldactone 100mg  daily. Repeat BMP in 5-7 days. Thanks

## 2015-12-11 NOTE — Telephone Encounter (Signed)
Can you bring her next Wed May 24th at 1PM for office visit? Thanks

## 2015-12-12 DIAGNOSIS — M1711 Unilateral primary osteoarthritis, right knee: Secondary | ICD-10-CM | POA: Diagnosis not present

## 2015-12-12 DIAGNOSIS — M25561 Pain in right knee: Secondary | ICD-10-CM | POA: Diagnosis not present

## 2015-12-12 MED ORDER — NONFORMULARY OR COMPOUNDED ITEM: Status: DC

## 2015-12-12 NOTE — Telephone Encounter (Signed)
Patient advised of her results. Continue as discussed.

## 2015-12-12 NOTE — Telephone Encounter (Addendum)
Pt states her insurance will not cover the [place] Peripheral Neuropathy cream.  Dr. Jacqualyn Posey states may try the substitute pain cream at [facility].  Left message informing pt Dr. Jacqualyn Posey had ordered a different pain medication cream.  Faxed to [employer].

## 2015-12-12 NOTE — Telephone Encounter (Signed)
Did result note. Thanks

## 2015-12-15 ENCOUNTER — Encounter: Payer: Self-pay | Admitting: Gastroenterology

## 2015-12-16 ENCOUNTER — Other Ambulatory Visit: Payer: Self-pay

## 2015-12-16 MED ORDER — SPIRONOLACTONE 25 MG PO TABS: 50.0000 mg | ORAL_TABLET | Freq: Every day | ORAL | Status: DC

## 2015-12-16 MED ORDER — FUROSEMIDE 20 MG PO TABS: ORAL_TABLET | ORAL | Status: DC

## 2015-12-18 ENCOUNTER — Other Ambulatory Visit: Payer: Self-pay

## 2015-12-18 ENCOUNTER — Other Ambulatory Visit (INDEPENDENT_AMBULATORY_CARE_PROVIDER_SITE_OTHER): Payer: PPO

## 2015-12-18 DIAGNOSIS — K7581 Nonalcoholic steatohepatitis (NASH): Principal | ICD-10-CM

## 2015-12-18 DIAGNOSIS — K7460 Unspecified cirrhosis of liver: Secondary | ICD-10-CM

## 2015-12-18 LAB — BASIC METABOLIC PANEL
BUN: 20 mg/dL (ref 6–23)
CHLORIDE: 100 meq/L (ref 96–112)
CO2: 30 mEq/L (ref 19–32)
Calcium: 9.2 mg/dL (ref 8.4–10.5)
Creatinine, Ser: 1.05 mg/dL (ref 0.40–1.20)
GFR: 54.65 mL/min — ABNORMAL LOW (ref >60.00)
GLUCOSE: 204 mg/dL — AB (ref 70–99)
POTASSIUM: 4.2 meq/L (ref 3.5–5.1)
SODIUM: 136 meq/L (ref 135–145)

## 2015-12-19 DIAGNOSIS — M1711 Unilateral primary osteoarthritis, right knee: Secondary | ICD-10-CM | POA: Diagnosis not present

## 2015-12-19 DIAGNOSIS — M25561 Pain in right knee: Secondary | ICD-10-CM | POA: Diagnosis not present

## 2015-12-30 ENCOUNTER — Other Ambulatory Visit (INDEPENDENT_AMBULATORY_CARE_PROVIDER_SITE_OTHER): Payer: PPO

## 2015-12-30 ENCOUNTER — Ambulatory Visit (INDEPENDENT_AMBULATORY_CARE_PROVIDER_SITE_OTHER): Payer: PPO | Admitting: Gastroenterology

## 2015-12-30 ENCOUNTER — Encounter: Payer: Self-pay | Admitting: Gastroenterology

## 2015-12-30 VITALS — BP 106/50 | HR 68 | Ht 67.0 in | Wt 146.0 lb

## 2015-12-30 DIAGNOSIS — K7290 Hepatic failure, unspecified without coma: Secondary | ICD-10-CM

## 2015-12-30 DIAGNOSIS — K7460 Unspecified cirrhosis of liver: Secondary | ICD-10-CM

## 2015-12-30 DIAGNOSIS — R600 Localized edema: Secondary | ICD-10-CM

## 2015-12-30 DIAGNOSIS — K7581 Nonalcoholic steatohepatitis (NASH): Secondary | ICD-10-CM

## 2015-12-30 DIAGNOSIS — K7682 Hepatic encephalopathy: Secondary | ICD-10-CM

## 2015-12-30 DIAGNOSIS — K7469 Other cirrhosis of liver: Secondary | ICD-10-CM

## 2015-12-30 LAB — COMPREHENSIVE METABOLIC PANEL
ALBUMIN: 3.2 g/dL — AB (ref 3.5–5.2)
ALK PHOS: 111 U/L (ref 39–117)
ALT: 26 U/L (ref 0–35)
AST: 34 U/L (ref 0–37)
BILIRUBIN TOTAL: 2.3 mg/dL — AB (ref 0.2–1.2)
BUN: 25 mg/dL — AB (ref 6–23)
CO2: 32 mEq/L (ref 19–32)
CREATININE: 1 mg/dL (ref 0.40–1.20)
Calcium: 9.8 mg/dL (ref 8.4–10.5)
Chloride: 101 mEq/L (ref 96–112)
GFR: 57.81 mL/min — ABNORMAL LOW (ref >60.00)
GLUCOSE: 169 mg/dL — AB (ref 70–99)
POTASSIUM: 4.6 meq/L (ref 3.5–5.1)
SODIUM: 138 meq/L (ref 135–145)
TOTAL PROTEIN: 6.4 g/dL (ref 6.0–8.3)

## 2015-12-30 LAB — BASIC METABOLIC PANEL
BUN: 25 mg/dL — AB (ref 6–23)
CHLORIDE: 101 meq/L (ref 96–112)
CO2: 32 mEq/L (ref 19–32)
CREATININE: 1 mg/dL (ref 0.40–1.20)
Calcium: 9.8 mg/dL (ref 8.4–10.5)
GFR: 57.81 mL/min — ABNORMAL LOW (ref >60.00)
GLUCOSE: 169 mg/dL — AB (ref 70–99)
Potassium: 4.6 mEq/L (ref 3.5–5.1)
Sodium: 138 mEq/L (ref 135–145)

## 2015-12-30 NOTE — Progress Notes (Signed)
Ashley Savage    [id number]    [birth year]  Primary Care Physician:BLYTH, Erline Levine, MD  Referring Physician: Mosie Lukes, MD [address] [place], [place] [address]  Chief complaint:  Lower extremity edema   HPI: 75 year old female with history of diabetes and Ashley Savage cirrhosis here for follow-up visit; patient contacted our office 2 weeks ago with complaints of increased leg swelling with some weeping, advised to increase diuretics to Lasix 40 mg daily and Aldactone 100 mg daily. She has noticed decreased in leg swelling and also decrease in her weight. She does eat out all her meals through a meal voucher program but didn't say she watches her salt intake and does not add any additional salt to her food.  She has 1 soft bowel movement daily with lactulose 2-3 times a day and she takes an additional dose of lactulose if she feels she is getting more tremors are her handwriting is worse. Denies any abdominal distention  EGD December 2016 showed small esophageal varices and portal gastropathy She underwent abdominal ultrasound March 2017 and CT abdomen and pelvis in July 2016 which did not show any focal liver lesions. Denies any nausea, vomiting, abdominal pain, melena or bright red blood per rectum   Outpatient Encounter Prescriptions as of 12/30/2015  Medication Sig  . ADVAIR DISKUS 250-50 MCG/DOSE AEPB INHALE 1 PUFF BY MOUTH TWICE DAILY  . albuterol (ACCUNEB) 1.25 MG/3ML nebulizer solution Take 1 ampule by nebulization as needed for wheezing.  Marland Kitchen albuterol (PROVENTIL HFA;VENTOLIN HFA) 108 (90 BASE) MCG/ACT inhaler Inhale 2 puffs into the lungs every 6 (six) hours as needed for wheezing or shortness of breath. Only dispense Ventolin  . cetirizine (ZYRTEC) 10 MG tablet Take 10 mg by mouth daily.  . Cholecalciferol (VITAMIN D3) 2000 UNITS TABS Take 1 capsule by mouth daily.   . furosemide (LASIX) 20 MG tablet Take 1 tablet twice daily  . guaiFENesin (MUCINEX) 600 MG 12  hr tablet Take 1 tablet (600 mg total) by mouth 2 (two) times daily.  . insulin aspart (NOVOLOG) 100 UNIT/ML injection Inject into the skin. Sliding scale  . Insulin Detemir (LEVEMIR FLEXPEN) 100 UNIT/ML Pen Inject 14 Units into the skin daily at 10 pm.  . Insulin Pen Needle (PEN NEEDLES) 31G X 6 MM MISC Use as directed with Levemir flexpen.  Marland Kitchen ipratropium-albuterol (DUONEB) 0.5-2.5 (3) MG/3ML SOLN Take 3 mLs by nebulization every 6 (six) hours. Reported on 08/29/2015  . lactulose (CHRONULAC) 10 GM/15ML solution TAKE 45 MLS BY MOUTH TWICE DAILY AS NEEDED FOR MILD CONSTIPATION.  . NONFORMULARY OR COMPOUNDED [place] compound:  Peripheral Neuropathy cream - Bupivacaine 1%, Doxepin 3%, Gabapentin 6%, Pentoxifylline 3%, Topiramate 1%, dispense 120 grams, +2refills, apply 1-2 grams to affected area 3-4 times daily.  . NONFORMULARY OR COMPOUNDED [place] compound:  Authorized Substitiute Pain Cream - Ibuprofen 15%, Baclofen 1%, Gabapentin 3%, Lidocaine 2%, dispense 120 grams, apply 1-2 grams to affected area 3-4 times daily, +3Refills.  Marland Kitchen omeprazole (PRILOSEC) 20 MG capsule TAKE 1 CAPSULE(20 MG) BY MOUTH DAILY  . ONE TOUCH ULTRA TEST test strip USE TWICE DAILY TO CHECK BLOOD SUGAR AS DIRECTED  . sodium chloride (OCEAN) 0.65 % SOLN nasal spray Place 1 spray into both nostrils as needed for congestion.  Marland Kitchen spironolactone (ALDACTONE) 25 MG tablet Take 2 tablets (50 mg total) by mouth daily.  Marland Kitchen umeclidinium-vilanterol (ANORO ELLIPTA) 62.5-25 MCG/INH AEPB Inhale 1  puff into the lungs daily.   No facility-administered encounter medications on file as of 12/30/2015.    Allergies as of 12/30/2015 - Review Complete 12/30/2015  Allergen Reaction Noted  . Citalopram  04/22/2012  . Erythromycin  02/26/2011  . Glimepiride  02/03/2015  . Versed [midazolam] Other (See Comments) 10/25/2014    Past Medical History  Diagnosis Date  . Emphysema   . Diabetes mellitus type 2  .  Hyperlipidemia   . Cancer [facility]) breast ca  right  . Anxiety   . Panic attacks   . Depression   . Arthritis of both knees 10/01/2013  . Benign paroxysmal positional vertigo 10/01/2013  . Neck pain 10/01/2013  . Esophageal reflux 10/01/2013  . Pedal edema 12/10/2013  . Overactive bladder 12/10/2013  . Tobacco abuse disorder 02/01/2014  . COPD (chronic obstructive pulmonary disease) ([place]) 10/01/2013  . Serum ammonia increased ([place]) 11/10/2014  . Hyperlipidemia, mixed   . Increased ammonia level 11/25/2014  . Diarrhea 12/30/2014  . Neuropathy (HCC)     feet   . Right knee pain   . Encephalopathy, hepatic ([place]) 06/07/2014  . Abnormal finding on liver function   . NASH (nonalcoholic steatohepatitis) 08/26/2015    Past Surgical History  Procedure Laterality Date  . Gallbladder surgery  1992  . Breast surgery  2009 right  . Appendectomy  2007  . Knee surgery    . Mandible fracture surgery    . Pilonidal cyst excision    . Tonsillectomy    . Cataract extraction      x 2    Family History  Problem Relation Age of Onset  . Heart failure Father   . COPD Father   . Arthritis Father 60  . Pneumonia Sister   . Breast cancer    . Stroke Mother   . Arthritis Mother 51  . Hyperlipidemia Mother   . Hypertension Mother   . Diabetes Mother   . Breast cancer Maternal Aunt   . Alcohol abuse Maternal Uncle     Social History   Social History  . Marital Status: Single    Spouse Name: N/A  . Number of Children: 0  . Years of Education: N/A   Occupational History  . retired    Social History Main Topics  . Smoking status: Former Smoker -- 0.50 packs/day for 58 years    Types: Cigarettes    Start date: 07/27/1964    Quit date: 08/05/2015  . Smokeless tobacco: Never Used     Comment: 1 pack per week  . Alcohol Use: No  . Drug Use: No  . Sexual Activity: No   Other Topics Concern  . Not on file   Social History Narrative      Review of systems: Review of Systems  Constitutional:  Negative for fever and chills.  HENT: Negative.   Eyes: Negative for blurred vision.  Respiratory: Negative for cough, shortness of breath and wheezing.   Cardiovascular: Negative for chest pain and palpitations.  Gastrointestinal: as per HPI Genitourinary: Negative for dysuria, urgency, frequency and hematuria.  Musculoskeletal: Negative for myalgias, back pain and joint pain.  Skin: Negative for itching and rash.  Neurological: Negative for dizziness, tremors, focal weakness, seizures and loss of consciousness.  Endo/Heme/Allergies: Negative for environmental allergies.  Psychiatric/Behavioral: Negative for depression, suicidal ideas and hallucinations.  All other systems reviewed and are negative.   Physical Exam: Filed Vitals:   12/30/15 1327  BP: 106/50  Pulse: 68   Gen:  No acute distress HEENT:  EOMI, sclera anicteric Neck:     No masses; no thyromegaly Lungs:    Clear to auscultation bilaterally; normal respiratory effort CV:         Regular rate and rhythm; no murmurs Abd:      + bowel sounds; soft, non-tender; no palpable masses, no distension Ext:    1+ edema b/l; adequate peripheral perfusion Skin:      Warm and dry; no rash Neuro: alert and oriented x 3 Psych: normal mood and affect  Data Reviewed:  Reviewed chart in epic   Assessment and Plan/Recommendations:  75 year old female with history of [place] cirrhosis, meld 11 decompensated by hepatic encephalopathy is here for follow-up of peripheral edema that has improved with increase in dose of diuretics Continue Lasix 40 mg daily and Aldactone 100 mg daily Recheck BMP Hepatic encephalopathy stable, continue lactulose 30 mL 3 times a day and titrate dose as needed to have one to 2 soft bowel movements a day She is up-to-date for esophageal varices & HCC screening Return in 3-6 months or sooner if needed   Damaris Hippo , MD [phone] Mon-Fri 8a-5p [phone] after 5p, weekends, holidays  CC: Mosie Lukes, MD

## 2015-12-30 NOTE — Patient Instructions (Addendum)
  You have been given a separate informational sheet regarding your tobacco use, the importance of quitting and local resources to help you quit.   Your physician has requested that you go to the basement for the following lab work before leaving today:  BMET  Please follow up in 3-6 months

## 2016-01-06 ENCOUNTER — Other Ambulatory Visit: Payer: Self-pay

## 2016-01-06 ENCOUNTER — Encounter: Payer: Self-pay | Admitting: Gastroenterology

## 2016-01-06 MED ORDER — SPIRONOLACTONE 50 MG PO TABS: ORAL_TABLET | ORAL | Status: DC

## 2016-01-19 ENCOUNTER — Other Ambulatory Visit: Payer: Self-pay | Admitting: Family Medicine

## 2016-01-20 ENCOUNTER — Other Ambulatory Visit: Payer: Self-pay | Admitting: Family Medicine

## 2016-01-20 MED ORDER — LACTULOSE 10 GM/15ML PO SOLN: ORAL | Status: DC

## 2016-01-21 ENCOUNTER — Encounter: Payer: Self-pay | Admitting: Gastroenterology

## 2016-01-21 NOTE — Telephone Encounter (Signed)
She has felt a little tired and thinking was not as clear. She has taken an extra dose of Chronulac. She feels a little better. She will take an extra dose for a couple of days.

## 2016-01-25 LAB — HM MAMMOGRAPHY: HM MAMMO: NORMAL (ref 0–4)

## 2016-01-26 ENCOUNTER — Encounter: Payer: Self-pay | Admitting: Pulmonary Disease

## 2016-02-03 ENCOUNTER — Encounter: Payer: PPO | Admitting: Family Medicine

## 2016-02-04 ENCOUNTER — Telehealth: Payer: Self-pay | Admitting: Family Medicine

## 2016-02-04 NOTE — Telephone Encounter (Signed)
Pt was no show for cpe 02/03/16, pt came into office this morning thinking it was for today, she had dates mixed up, pt is rescheduled for 02/20/16, charge or no charge?

## 2016-02-04 NOTE — Telephone Encounter (Signed)
No charge. 

## 2016-02-05 ENCOUNTER — Encounter: Payer: Self-pay | Admitting: Family Medicine

## 2016-02-13 ENCOUNTER — Other Ambulatory Visit: Payer: Self-pay | Admitting: Family Medicine

## 2016-02-20 ENCOUNTER — Encounter: Payer: PPO | Admitting: Family Medicine

## 2016-02-24 ENCOUNTER — Encounter: Payer: Self-pay | Admitting: Adult Health

## 2016-02-25 ENCOUNTER — Encounter: Payer: Self-pay | Admitting: Family Medicine

## 2016-02-25 ENCOUNTER — Ambulatory Visit (INDEPENDENT_AMBULATORY_CARE_PROVIDER_SITE_OTHER): Payer: PPO | Admitting: Family Medicine

## 2016-02-25 VITALS — BP 118/52 | HR 78 | Temp 97.7°F | Ht 66.0 in | Wt 158.1 lb

## 2016-02-25 DIAGNOSIS — D696 Thrombocytopenia, unspecified: Secondary | ICD-10-CM | POA: Diagnosis not present

## 2016-02-25 DIAGNOSIS — J449 Chronic obstructive pulmonary disease, unspecified: Secondary | ICD-10-CM | POA: Diagnosis not present

## 2016-02-25 DIAGNOSIS — E1165 Type 2 diabetes mellitus with hyperglycemia: Secondary | ICD-10-CM | POA: Diagnosis not present

## 2016-02-25 DIAGNOSIS — M62838 Other muscle spasm: Secondary | ICD-10-CM | POA: Diagnosis not present

## 2016-02-25 DIAGNOSIS — Z0000 Encounter for general adult medical examination without abnormal findings: Secondary | ICD-10-CM | POA: Diagnosis not present

## 2016-02-25 DIAGNOSIS — Z720 Tobacco use: Secondary | ICD-10-CM | POA: Diagnosis not present

## 2016-02-25 DIAGNOSIS — K219 Gastro-esophageal reflux disease without esophagitis: Secondary | ICD-10-CM | POA: Diagnosis not present

## 2016-02-25 DIAGNOSIS — E782 Mixed hyperlipidemia: Secondary | ICD-10-CM

## 2016-02-25 DIAGNOSIS — Z794 Long term (current) use of insulin: Secondary | ICD-10-CM

## 2016-02-25 DIAGNOSIS — R609 Edema, unspecified: Secondary | ICD-10-CM

## 2016-02-25 DIAGNOSIS — E1142 Type 2 diabetes mellitus with diabetic polyneuropathy: Secondary | ICD-10-CM | POA: Diagnosis not present

## 2016-02-25 DIAGNOSIS — K7460 Unspecified cirrhosis of liver: Secondary | ICD-10-CM

## 2016-02-25 DIAGNOSIS — K7581 Nonalcoholic steatohepatitis (NASH): Secondary | ICD-10-CM

## 2016-02-25 DIAGNOSIS — J9611 Chronic respiratory failure with hypoxia: Secondary | ICD-10-CM

## 2016-02-25 DIAGNOSIS — R7989 Other specified abnormal findings of blood chemistry: Secondary | ICD-10-CM

## 2016-02-25 LAB — TSH: TSH: 1.39 u[IU]/mL (ref 0.35–4.50)

## 2016-02-25 LAB — CBC
HEMATOCRIT: 41.6 % (ref 36.0–46.0)
HEMOGLOBIN: 13.9 g/dL (ref 12.0–15.0)
MCHC: 33.5 g/dL (ref 30.0–36.0)
MCV: 96.2 fl (ref 78.0–100.0)
PLATELETS: 121 10*3/uL — AB (ref 150.0–400.0)
RBC: 4.32 Mil/uL (ref 3.87–5.11)
RDW: 15 % (ref 11.5–15.5)
WBC: 6.8 10*3/uL (ref 4.0–10.5)

## 2016-02-25 LAB — COMPREHENSIVE METABOLIC PANEL
ALK PHOS: 127 U/L — AB (ref 39–117)
ALT: 25 U/L (ref 0–35)
AST: 36 U/L (ref 0–37)
Albumin: 3.1 g/dL — ABNORMAL LOW (ref 3.5–5.2)
BILIRUBIN TOTAL: 2.2 mg/dL — AB (ref 0.2–1.2)
BUN: 12 mg/dL (ref 6–23)
CALCIUM: 9.3 mg/dL (ref 8.4–10.5)
CO2: 27 meq/L (ref 19–32)
Chloride: 104 mEq/L (ref 96–112)
Creatinine, Ser: 0.8 mg/dL (ref 0.40–1.20)
GFR: 74.75 mL/min (ref >60.00)
GLUCOSE: 189 mg/dL — AB (ref 70–99)
POTASSIUM: 4.3 meq/L (ref 3.5–5.1)
Sodium: 137 mEq/L (ref 135–145)
Total Protein: 6.2 g/dL (ref 6.0–8.3)

## 2016-02-25 LAB — LIPID PANEL
CHOL/HDL RATIO: 3
Cholesterol: 187 mg/dL (ref 0–200)
HDL: 55.8 mg/dL (ref >39.00)
LDL Cholesterol: 108 mg/dL — ABNORMAL HIGH (ref 0–99)
NonHDL: 130.77
TRIGLYCERIDES: 112 mg/dL (ref 0.0–149.0)
VLDL: 22.4 mg/dL (ref 0.0–40.0)

## 2016-02-25 LAB — HEMOGLOBIN A1C: Hgb A1c MFr Bld: 6.3 % (ref 4.6–6.5)

## 2016-02-25 LAB — MAGNESIUM: Magnesium: 1.8 mg/dL (ref 1.5–2.5)

## 2016-02-25 MED ORDER — ALBUTEROL SULFATE HFA 108 (90 BASE) MCG/ACT IN AERS: 2.0000 | INHALATION_SPRAY | Freq: Four times a day (QID) | RESPIRATORY_TRACT | 3 refills | Status: DC | PRN

## 2016-02-25 MED ORDER — INSULIN ASPART 100 UNIT/ML ~~LOC~~ SOLN: SUBCUTANEOUS | 3 refills | Status: DC

## 2016-02-25 MED ORDER — RANITIDINE HCL 150 MG PO TABS: 150.0000 mg | ORAL_TABLET | Freq: Two times a day (BID) | ORAL | 5 refills | Status: DC

## 2016-02-25 NOTE — Patient Instructions (Signed)
Preventive Care for Adults, Female A healthy lifestyle and preventive care can promote health and wellness. Preventive health guidelines for women include the following key practices.  A routine yearly physical is a good way to check with your health care provider about your health and preventive screening. It is a chance to share any concerns and updates on your health and to receive a thorough exam.  Visit your dentist for a routine exam and preventive care every 6 months. Brush your teeth twice a day and floss once a day. Good oral hygiene prevents tooth decay and gum disease.  The frequency of eye exams is based on your age, health, family medical history, use of contact lenses, and other factors. Follow your health care provider's recommendations for frequency of eye exams.  Eat a healthy diet. Foods like vegetables, fruits, whole grains, low-fat dairy products, and lean protein foods contain the nutrients you need without too many calories. Decrease your intake of foods high in solid fats, added sugars, and salt. Eat the right amount of calories for you.Get information about a proper diet from your health care provider, if necessary.  Regular physical exercise is one of the most important things you can do for your health. Most adults should get at least 150 minutes of moderate-intensity exercise (any activity that increases your heart rate and causes you to sweat) each week. In addition, most adults need muscle-strengthening exercises on 2 or more days a week.  Maintain a healthy weight. The body mass index (BMI) is a screening tool to identify possible weight problems. It provides an estimate of body fat based on height and weight. Your health care provider can find your BMI and can help you achieve or maintain a healthy weight.For adults 20 years and older:  A BMI below 18.5 is considered underweight.  A BMI of 18.5 to 24.9 is normal.  A BMI of 25 to 29.9 is considered overweight.  A  BMI of 30 and above is considered obese.  Maintain normal blood lipids and cholesterol levels by exercising and minimizing your intake of saturated fat. Eat a balanced diet with plenty of fruit and vegetables. Blood tests for lipids and cholesterol should begin at age 45 and be repeated every 5 years. If your lipid or cholesterol levels are high, you are over 50, or you are at high risk for heart disease, you may need your cholesterol levels checked more frequently.Ongoing high lipid and cholesterol levels should be treated with medicines if diet and exercise are not working.  If you smoke, find out from your health care provider how to quit. If you do not use tobacco, do not start.  Lung cancer screening is recommended for adults aged 45-80 years who are at high risk for developing lung cancer because of a history of smoking. A yearly low-dose CT scan of the lungs is recommended for people who have at least a 30-pack-year history of smoking and are a current smoker or have quit within the past 15 years. A pack year of smoking is smoking an average of 1 pack of cigarettes a day for 1 year (for example: 1 pack a day for 30 years or 2 packs a day for 15 years). Yearly screening should continue until the smoker has stopped smoking for at least 15 years. Yearly screening should be stopped for people who develop a health problem that would prevent them from having lung cancer treatment.  If you are pregnant, do not drink alcohol. If you are  breastfeeding, be very cautious about drinking alcohol. If you are not pregnant and choose to drink alcohol, do not have more than 1 drink per day. One drink is considered to be 12 ounces (355 mL) of beer, 5 ounces (148 mL) of wine, or 1.5 ounces (44 mL) of liquor.  Avoid use of street drugs. Do not share needles with anyone. Ask for help if you need support or instructions about stopping the use of drugs.  High blood pressure causes heart disease and increases the risk  of stroke. Your blood pressure should be checked at least every 1 to 2 years. Ongoing high blood pressure should be treated with medicines if weight loss and exercise do not work.  If you are 55-79 years old, ask your health care provider if you should take aspirin to prevent strokes.  Diabetes screening is done by taking a blood sample to check your blood glucose level after you have not eaten for a certain period of time (fasting). If you are not overweight and you do not have risk factors for diabetes, you should be screened once every 3 years starting at age 45. If you are overweight or obese and you are 40-70 years of age, you should be screened for diabetes every year as part of your cardiovascular risk assessment.  Breast cancer screening is essential preventive care for women. You should practice "breast self-awareness." This means understanding the normal appearance and feel of your breasts and may include breast self-examination. Any changes detected, no matter how small, should be reported to a health care provider. Women in their 20s and 30s should have a clinical breast exam (CBE) by a health care provider as part of a regular health exam every 1 to 3 years. After age 40, women should have a CBE every year. Starting at age 40, women should consider having a mammogram (breast X-ray test) every year. Women who have a family history of breast cancer should talk to their health care provider about genetic screening. Women at a high risk of breast cancer should talk to their health care providers about having an MRI and a mammogram every year.  Breast cancer gene (BRCA)-related cancer risk assessment is recommended for women who have family members with BRCA-related cancers. BRCA-related cancers include breast, ovarian, tubal, and peritoneal cancers. Having family members with these cancers may be associated with an increased risk for harmful changes (mutations) in the breast cancer genes BRCA1 and  BRCA2. Results of the assessment will determine the need for genetic counseling and BRCA1 and BRCA2 testing.  Your health care provider may recommend that you be screened regularly for cancer of the pelvic organs (ovaries, uterus, and vagina). This screening involves a pelvic examination, including checking for microscopic changes to the surface of your cervix (Pap test). You may be encouraged to have this screening done every 3 years, beginning at age 21.  For women ages 30-65, health care providers may recommend pelvic exams and Pap testing every 3 years, or they may recommend the Pap and pelvic exam, combined with testing for human papilloma virus (HPV), every 5 years. Some types of HPV increase your risk of cervical cancer. Testing for HPV may also be done on women of any age with unclear Pap test results.  Other health care providers may not recommend any screening for nonpregnant women who are considered low risk for pelvic cancer and who do not have symptoms. Ask your health care provider if a screening pelvic exam is right for   you.  If you have had past treatment for cervical cancer or a condition that could lead to cancer, you need Pap tests and screening for cancer for at least 20 years after your treatment. If Pap tests have been discontinued, your risk factors (such as having a new sexual partner) need to be reassessed to determine if screening should resume. Some women have medical problems that increase the chance of getting cervical cancer. In these cases, your health care provider may recommend more frequent screening and Pap tests.  Colorectal cancer can be detected and often prevented. Most routine colorectal cancer screening begins at the age of 50 years and continues through age 75 years. However, your health care provider may recommend screening at an earlier age if you have risk factors for colon cancer. On a yearly basis, your health care provider may provide home test kits to check  for hidden blood in the stool. Use of a small camera at the end of a tube, to directly examine the colon (sigmoidoscopy or colonoscopy), can detect the earliest forms of colorectal cancer. Talk to your health care provider about this at age 50, when routine screening begins. Direct exam of the colon should be repeated every 5-10 years through age 75 years, unless early forms of precancerous polyps or small growths are found.  People who are at an increased risk for hepatitis B should be screened for this virus. You are considered at high risk for hepatitis B if:  You were born in a country where hepatitis B occurs often. Talk with your health care provider about which countries are considered high risk.  Your parents were born in a high-risk country and you have not received a shot to protect against hepatitis B (hepatitis B vaccine).  You have HIV or AIDS.  You use needles to inject street drugs.  You live with, or have sex with, someone who has hepatitis B.  You get hemodialysis treatment.  You take certain medicines for conditions like cancer, organ transplantation, and autoimmune conditions.  Hepatitis C blood testing is recommended for all people born from 1945 through 1965 and any individual with known risks for hepatitis C.  Practice safe sex. Use condoms and avoid high-risk sexual practices to reduce the spread of sexually transmitted infections (STIs). STIs include gonorrhea, chlamydia, syphilis, trichomonas, herpes, HPV, and human immunodeficiency virus (HIV). Herpes, HIV, and HPV are viral illnesses that have no cure. They can result in disability, cancer, and death.  You should be screened for sexually transmitted illnesses (STIs) including gonorrhea and chlamydia if:  You are sexually active and are younger than 24 years.  You are older than 24 years and your health care provider tells you that you are at risk for this type of infection.  Your sexual activity has changed  since you were last screened and you are at an increased risk for chlamydia or gonorrhea. Ask your health care provider if you are at risk.  If you are at risk of being infected with HIV, it is recommended that you take a prescription medicine daily to prevent HIV infection. This is called preexposure prophylaxis (PrEP). You are considered at risk if:  You are sexually active and do not regularly use condoms or know the HIV status of your partner(s).  You take drugs by injection.  You are sexually active with a partner who has HIV.  Talk with your health care provider about whether you are at high risk of being infected with HIV. If   you choose to begin PrEP, you should first be tested for HIV. You should then be tested every 3 months for as long as you are taking PrEP.  Osteoporosis is a disease in which the bones lose minerals and strength with aging. This can result in serious bone fractures or breaks. The risk of osteoporosis can be identified using a bone density scan. Women ages 67 years and over and women at risk for fractures or osteoporosis should discuss screening with their health care providers. Ask your health care provider whether you should take a calcium supplement or vitamin D to reduce the rate of osteoporosis.  Menopause can be associated with physical symptoms and risks. Hormone replacement therapy is available to decrease symptoms and risks. You should talk to your health care provider about whether hormone replacement therapy is right for you.  Use sunscreen. Apply sunscreen liberally and repeatedly throughout the day. You should seek shade when your shadow is shorter than you. Protect yourself by wearing long sleeves, pants, a wide-brimmed hat, and sunglasses year round, whenever you are outdoors.  Once a month, do a whole body skin exam, using a mirror to look at the skin on your back. Tell your health care provider of new moles, moles that have irregular borders, moles that  are larger than a pencil eraser, or moles that have changed in shape or color.  Stay current with required vaccines (immunizations).  Influenza vaccine. All adults should be immunized every year.  Tetanus, diphtheria, and acellular pertussis (Td, Tdap) vaccine. Pregnant women should receive 1 dose of Tdap vaccine during each pregnancy. The dose should be obtained regardless of the length of time since the last dose. Immunization is preferred during the 27th-36th week of gestation. An adult who has not previously received Tdap or who does not know her vaccine status should receive 1 dose of Tdap. This initial dose should be followed by tetanus and diphtheria toxoids (Td) booster doses every 10 years. Adults with an unknown or incomplete history of completing a 3-dose immunization series with Td-containing vaccines should begin or complete a primary immunization series including a Tdap dose. Adults should receive a Td booster every 10 years.  Varicella vaccine. An adult without evidence of immunity to varicella should receive 2 doses or a second dose if she has previously received 1 dose. Pregnant females who do not have evidence of immunity should receive the first dose after pregnancy. This first dose should be obtained before leaving the health care facility. The second dose should be obtained 4-8 weeks after the first dose.  Human papillomavirus (HPV) vaccine. Females aged 13-26 years who have not received the vaccine previously should obtain the 3-dose series. The vaccine is not recommended for use in pregnant females. However, pregnancy testing is not needed before receiving a dose. If a female is found to be pregnant after receiving a dose, no treatment is needed. In that case, the remaining doses should be delayed until after the pregnancy. Immunization is recommended for any person with an immunocompromised condition through the age of 61 years if she did not get any or all doses earlier. During the  3-dose series, the second dose should be obtained 4-8 weeks after the first dose. The third dose should be obtained 24 weeks after the first dose and 16 weeks after the second dose.  Zoster vaccine. One dose is recommended for adults aged 30 years or older unless certain conditions are present.  Measles, mumps, and rubella (MMR) vaccine. Adults born  before 1957 generally are considered immune to measles and mumps. Adults born in 1957 or later should have 1 or more doses of MMR vaccine unless there is a contraindication to the vaccine or there is laboratory evidence of immunity to each of the three diseases. A routine second dose of MMR vaccine should be obtained at least 28 days after the first dose for students attending postsecondary schools, health care workers, or international travelers. People who received inactivated measles vaccine or an unknown type of measles vaccine during 1963-1967 should receive 2 doses of MMR vaccine. People who received inactivated mumps vaccine or an unknown type of mumps vaccine before 1979 and are at high risk for mumps infection should consider immunization with 2 doses of MMR vaccine. For females of childbearing age, rubella immunity should be determined. If there is no evidence of immunity, females who are not pregnant should be vaccinated. If there is no evidence of immunity, females who are pregnant should delay immunization until after pregnancy. Unvaccinated health care workers born before 1957 who lack laboratory evidence of measles, mumps, or rubella immunity or laboratory confirmation of disease should consider measles and mumps immunization with 2 doses of MMR vaccine or rubella immunization with 1 dose of MMR vaccine.  Pneumococcal 13-valent conjugate (PCV13) vaccine. When indicated, a person who is uncertain of his immunization history and has no record of immunization should receive the PCV13 vaccine. All adults 65 years of age and older should receive this  vaccine. An adult aged 19 years or older who has certain medical conditions and has not been previously immunized should receive 1 dose of PCV13 vaccine. This PCV13 should be followed with a dose of pneumococcal polysaccharide (PPSV23) vaccine. Adults who are at high risk for pneumococcal disease should obtain the PPSV23 vaccine at least 8 weeks after the dose of PCV13 vaccine. Adults older than 73 years of age who have normal immune system function should obtain the PPSV23 vaccine dose at least 1 year after the dose of PCV13 vaccine.  Pneumococcal polysaccharide (PPSV23) vaccine. When PCV13 is also indicated, PCV13 should be obtained first. All adults aged 65 years and older should be immunized. An adult younger than age 65 years who has certain medical conditions should be immunized. Any person who resides in a nursing home or long-term care facility should be immunized. An adult smoker should be immunized. People with an immunocompromised condition and certain other conditions should receive both PCV13 and PPSV23 vaccines. People with human immunodeficiency virus (HIV) infection should be immunized as soon as possible after diagnosis. Immunization during chemotherapy or radiation therapy should be avoided. Routine use of PPSV23 vaccine is not recommended for American Indians, [place] Natives, or people younger than 65 years unless there are medical conditions that require PPSV23 vaccine. When indicated, people who have unknown immunization and have no record of immunization should receive PPSV23 vaccine. One-time revaccination 5 years after the first dose of PPSV23 is recommended for people aged 19-64 years who have chronic kidney failure, nephrotic syndrome, asplenia, or immunocompromised conditions. People who received 1-2 doses of PPSV23 before age 65 years should receive another dose of PPSV23 vaccine at age 65 years or later if at least 5 years have passed since the previous dose. Doses of PPSV23 are not  needed for people immunized with PPSV23 at or after age 65 years.  Meningococcal vaccine. Adults with asplenia or persistent complement component deficiencies should receive 2 doses of quadrivalent meningococcal conjugate (MenACWY-D) vaccine. The doses should be obtained   at least 2 months apart. Microbiologists working with certain meningococcal bacteria, [place] recruits, people at risk during an outbreak, and people who travel to or live in countries with a high rate of meningitis should be immunized. A first-year college student up through age 34 years who is living in a residence hall should receive a dose if she did not receive a dose on or after her 16th birthday. Adults who have certain high-risk conditions should receive one or more doses of vaccine.  Hepatitis A vaccine. Adults who wish to be protected from this disease, have certain high-risk conditions, work with hepatitis A-infected animals, work in hepatitis A research labs, or travel to or work in countries with a high rate of hepatitis A should be immunized. Adults who were previously unvaccinated and who anticipate close contact with an international adoptee during the first 60 days after arrival in the [place] from a country with a high rate of hepatitis A should be immunized.  Hepatitis B vaccine. Adults who wish to be protected from this disease, have certain high-risk conditions, may be exposed to blood or other infectious body fluids, are household contacts or sex partners of hepatitis B positive people, are clients or workers in certain care facilities, or travel to or work in countries with a high rate of hepatitis B should be immunized.  Haemophilus influenzae type b (Hib) vaccine. A previously unvaccinated person with asplenia or sickle cell disease or having a scheduled splenectomy should receive 1 dose of Hib vaccine. Regardless of previous immunization, a recipient of a hematopoietic stem cell transplant should receive a  3-dose series 6-12 months after her successful transplant. Hib vaccine is not recommended for adults with HIV infection. Preventive Services / Frequency Ages 35 to 4 years  Blood pressure check.** / Every 3-5 years.  Lipid and cholesterol check.** / Every 5 years beginning at age 60.  Clinical breast exam.** / Every 3 years for women in their 71s and 10s.  BRCA-related cancer risk assessment.** / For women who have family members with a BRCA-related cancer (breast, ovarian, tubal, or peritoneal cancers).  Pap test.** / Every 2 years from ages 76 through 26. Every 3 years starting at age 61 through age 76 or 93 with a history of 3 consecutive normal Pap tests.  HPV screening.** / Every 3 years from ages 37 through ages 60 to 51 with a history of 3 consecutive normal Pap tests.  Hepatitis C blood test.** / For any individual with known risks for hepatitis C.  Skin self-exam. / Monthly.  Influenza vaccine. / Every year.  Tetanus, diphtheria, and acellular pertussis (Tdap, Td) vaccine.** / Consult your health care provider. Pregnant women should receive 1 dose of Tdap vaccine during each pregnancy. 1 dose of Td every 10 years.  Varicella vaccine.** / Consult your health care provider. Pregnant females who do not have evidence of immunity should receive the first dose after pregnancy.  HPV vaccine. / 3 doses over 6 months, if 93 and younger. The vaccine is not recommended for use in pregnant females. However, pregnancy testing is not needed before receiving a dose.  Measles, mumps, rubella (MMR) vaccine.** / You need at least 1 dose of MMR if you were born in 1957 or later. You may also need a 2nd dose. For females of childbearing age, rubella immunity should be determined. If there is no evidence of immunity, females who are not pregnant should be vaccinated. If there is no evidence of immunity, females who are  pregnant should delay immunization until after pregnancy.  Pneumococcal  13-valent conjugate (PCV13) vaccine.** / Consult your health care provider.  Pneumococcal polysaccharide (PPSV23) vaccine.** / 1 to 2 doses if you smoke cigarettes or if you have certain conditions.  Meningococcal vaccine.** / 1 dose if you are age 68 to 8 years and a Market researcher living in a residence hall, or have one of several medical conditions, you need to get vaccinated against meningococcal disease. You may also need additional booster doses.  Hepatitis A vaccine.** / Consult your health care provider.  Hepatitis B vaccine.** / Consult your health care provider.  Haemophilus influenzae type b (Hib) vaccine.** / Consult your health care provider. Ages 7 to 53 years  Blood pressure check.** / Every year.  Lipid and cholesterol check.** / Every 5 years beginning at age 25 years.  Lung cancer screening. / Every year if you are aged 11-80 years and have a 30-pack-year history of smoking and currently smoke or have quit within the past 15 years. Yearly screening is stopped once you have quit smoking for at least 15 years or develop a health problem that would prevent you from having lung cancer treatment.  Clinical breast exam.** / Every year after age 48 years.  BRCA-related cancer risk assessment.** / For women who have family members with a BRCA-related cancer (breast, ovarian, tubal, or peritoneal cancers).  Mammogram.** / Every year beginning at age 41 years and continuing for as long as you are in good health. Consult with your health care provider.  Pap test.** / Every 3 years starting at age 65 years through age 37 or 70 years with a history of 3 consecutive normal Pap tests.  HPV screening.** / Every 3 years from ages 72 years through ages 60 to 40 years with a history of 3 consecutive normal Pap tests.  Fecal occult blood test (FOBT) of stool. / Every year beginning at age 21 years and continuing until age 5 years. You may not need to do this test if you get  a colonoscopy every 10 years.  Flexible sigmoidoscopy or colonoscopy.** / Every 5 years for a flexible sigmoidoscopy or every 10 years for a colonoscopy beginning at age 35 years and continuing until age 48 years.  Hepatitis C blood test.** / For all people born from [birth year] through 1965 and any individual with known risks for hepatitis C.  Skin self-exam. / Monthly.  Influenza vaccine. / Every year.  Tetanus, diphtheria, and acellular pertussis (Tdap/Td) vaccine.** / Consult your health care provider. Pregnant women should receive 1 dose of Tdap vaccine during each pregnancy. 1 dose of Td every 10 years.  Varicella vaccine.** / Consult your health care provider. Pregnant females who do not have evidence of immunity should receive the first dose after pregnancy.  Zoster vaccine.** / 1 dose for adults aged 30 years or older.  Measles, mumps, rubella (MMR) vaccine.** / You need at least 1 dose of MMR if you were born in 1957 or later. You may also need a second dose. For females of childbearing age, rubella immunity should be determined. If there is no evidence of immunity, females who are not pregnant should be vaccinated. If there is no evidence of immunity, females who are pregnant should delay immunization until after pregnancy.  Pneumococcal 13-valent conjugate (PCV13) vaccine.** / Consult your health care provider.  Pneumococcal polysaccharide (PPSV23) vaccine.** / 1 to 2 doses if you smoke cigarettes or if you have certain conditions.  Meningococcal vaccine.** /  Consult your health care provider.  Hepatitis A vaccine.** / Consult your health care provider.  Hepatitis B vaccine.** / Consult your health care provider.  Haemophilus influenzae type b (Hib) vaccine.** / Consult your health care provider. Ages 64 years and over  Blood pressure check.** / Every year.  Lipid and cholesterol check.** / Every 5 years beginning at age 23 years.  Lung cancer screening. / Every year if you  are aged 16-80 years and have a 30-pack-year history of smoking and currently smoke or have quit within the past 15 years. Yearly screening is stopped once you have quit smoking for at least 15 years or develop a health problem that would prevent you from having lung cancer treatment.  Clinical breast exam.** / Every year after age 74 years.  BRCA-related cancer risk assessment.** / For women who have family members with a BRCA-related cancer (breast, ovarian, tubal, or peritoneal cancers).  Mammogram.** / Every year beginning at age 44 years and continuing for as long as you are in good health. Consult with your health care provider.  Pap test.** / Every 3 years starting at age 58 years through age 22 or 39 years with 3 consecutive normal Pap tests. Testing can be stopped between 65 and 70 years with 3 consecutive normal Pap tests and no abnormal Pap or HPV tests in the past 10 years.  HPV screening.** / Every 3 years from ages 64 years through ages 70 or 61 years with a history of 3 consecutive normal Pap tests. Testing can be stopped between 65 and 70 years with 3 consecutive normal Pap tests and no abnormal Pap or HPV tests in the past 10 years.  Fecal occult blood test (FOBT) of stool. / Every year beginning at age 40 years and continuing until age 27 years. You may not need to do this test if you get a colonoscopy every 10 years.  Flexible sigmoidoscopy or colonoscopy.** / Every 5 years for a flexible sigmoidoscopy or every 10 years for a colonoscopy beginning at age 7 years and continuing until age 32 years.  Hepatitis C blood test.** / For all people born from [birth year] through 1963 and any individual with known risks for hepatitis C.  Osteoporosis screening.** / A one-time screening for women ages 30 years and over and women at risk for fractures or osteoporosis.  Skin self-exam. / Monthly.  Influenza vaccine. / Every year.  Tetanus, diphtheria, and acellular pertussis (Tdap/Td)  vaccine.** / 1 dose of Td every 10 years.  Varicella vaccine.** / Consult your health care provider.  Zoster vaccine.** / 1 dose for adults aged 35 years or older.  Pneumococcal 13-valent conjugate (PCV13) vaccine.** / Consult your health care provider.  Pneumococcal polysaccharide (PPSV23) vaccine.** / 1 dose for all adults aged 46 years and older.  Meningococcal vaccine.** / Consult your health care provider.  Hepatitis A vaccine.** / Consult your health care provider.  Hepatitis B vaccine.** / Consult your health care provider.  Haemophilus influenzae type b (Hib) vaccine.** / Consult your health care provider. ** Family history and personal history of risk and conditions may change your health care provider's recommendations.   This information is not intended to replace advice given to you by your health care provider. Make sure you discuss any questions you have with your health care provider.   Document Released: 09/08/2001 Document Revised: 08/03/2014 Document Reviewed: 12/08/2010 Elsevier Interactive Patient Education [employer].

## 2016-02-25 NOTE — Assessment & Plan Note (Signed)
Has an appt with pulmonology later this week. Reports Advair makes her feel more SOB with this but she tolerates Anoro much better, will discuss with pulmonology, could switch

## 2016-02-26 LAB — MICROALBUMIN / CREATININE URINE RATIO
CREATININE, U: 173.1 mg/dL
MICROALB/CREAT RATIO: 0.4 mg/g (ref 0.0–30.0)

## 2016-02-27 ENCOUNTER — Encounter: Payer: Self-pay | Admitting: Family Medicine

## 2016-02-27 ENCOUNTER — Encounter: Payer: Self-pay | Admitting: Adult Health

## 2016-02-27 ENCOUNTER — Ambulatory Visit (INDEPENDENT_AMBULATORY_CARE_PROVIDER_SITE_OTHER): Payer: PPO | Admitting: Adult Health

## 2016-02-27 ENCOUNTER — Telehealth: Payer: Self-pay | Admitting: Pulmonary Disease

## 2016-02-27 DIAGNOSIS — J449 Chronic obstructive pulmonary disease, unspecified: Secondary | ICD-10-CM

## 2016-02-27 MED ORDER — TIOTROPIUM BROMIDE MONOHYDRATE 18 MCG IN CAPS: 18.0000 ug | ORAL_CAPSULE | Freq: Every day | RESPIRATORY_TRACT | 12 refills | Status: DC

## 2016-02-27 MED ORDER — "INSULIN SYRINGE-NEEDLE U-100 31G X 5/16"" 1 ML MISC": 12 refills | Status: DC

## 2016-02-27 MED ORDER — BUDESONIDE-FORMOTEROL FUMARATE 160-4.5 MCG/ACT IN AERO: 2.0000 | INHALATION_SPRAY | Freq: Two times a day (BID) | RESPIRATORY_TRACT | 6 refills | Status: DC

## 2016-02-27 NOTE — Progress Notes (Signed)
Subjective:    [name], female    DOB: 01-Nov-1940, 75 y.o.   MRN: [id number]  HPI  75 year old female, smoker with gold C COPD chronic liver dz , followed by Hepatologist with recent  Dx of Cirrhosis -non alcoholic .   Test Spirometry July 2014 with FEV1 of 41%, ratio 45, FVC 68%  02/27/2016  Follow up :   COPD-GOLD C , smoker  Returns for a 3 month follow up . Says she is doing better but still gets winded  With walking. She is trying to be more active.  Still smoking , discussed cessation.  No chest pain, orthopnea , increased edema or hemoptysis .  Prevnar and PVX are utd.   We discussed pulmonary rehab, referral sent.  Advair and ANORO are not covered with insurance now Symbicort and Spiriva are covered. We discussed trying them now.      Past Medical History:  Diagnosis Date  . Abnormal finding on liver function   . Anxiety   . Arthritis of both knees 10/01/2013  . Benign paroxysmal positional vertigo 10/01/2013  . Cancer [facility]) breast ca  right  . COPD (chronic obstructive pulmonary disease) ([place]) 10/01/2013  . Depression   . Diabetes mellitus type 2  . Diarrhea 12/30/2014  . Emphysema   . Encephalopathy, hepatic ([place]) 06/07/2014  . Esophageal reflux 10/01/2013  . Hyperlipidemia   . Hyperlipidemia, mixed   . Increased ammonia level 11/25/2014  . NASH (nonalcoholic steatohepatitis) 08/26/2015  . Neck pain 10/01/2013  . Neuropathy ([facility])    feet   . Overactive bladder 12/10/2013  . Panic attacks   . Pedal edema 12/10/2013  . Right knee pain   . Serum ammonia increased ([place]) 11/10/2014  . Tobacco abuse disorder 02/01/2014   Current Outpatient Prescriptions on File Prior to Visit  Medication Sig Dispense Refill  . albuterol (ACCUNEB) 1.25 MG/3ML nebulizer solution Take 1 ampule by nebulization as needed for wheezing.    Marland Kitchen albuterol (PROVENTIL HFA;VENTOLIN HFA) 108 (90 Base) MCG/ACT inhaler Inhale 2 puffs into the lungs every 6 (six) hours as needed for wheezing  or shortness of breath. Only dispense Ventolin 1 Inhaler 3  . B-D UF III MINI PEN NEEDLES 31G X 5 MM MISC USE AS DIRECTED WITH LEVEMIR FLEXPEN 100 each 0  . cetirizine (ZYRTEC) 10 MG tablet Take 10 mg by mouth daily as needed.     . Cholecalciferol (VITAMIN D3) 2000 UNITS TABS Take 1 capsule by mouth daily.     . furosemide (LASIX) 20 MG tablet Take 1 tablet twice daily 60 tablet 1  . guaiFENesin (MUCINEX) 600 MG 12 hr tablet Take 1 tablet (600 mg total) by mouth 2 (two) times daily. (Patient taking differently: Take 600 mg by mouth 2 (two) times daily as needed. ) 40 tablet 0  . insulin aspart (NOVOLOG) 100 UNIT/ML injection 4 units SQ q lunch daily and prn Sliding scale:  BS <200 no units BS 201-250 use 2 units BS 251-300 use 4 units BS 301-350 use 6 units BS 351-400 use 8 units BS 401-450 use 10 units BS>451 call MD 10 mL 3  . Insulin Detemir (LEVEMIR FLEXPEN) 100 UNIT/ML Pen Inject 14 Units into the skin daily at 10 pm. 15 mL 5  . ipratropium-albuterol (DUONEB) 0.5-2.5 (3) MG/3ML SOLN Take 3 mLs by nebulization every 6 (six) hours. Reported on 08/29/2015    . lactulose (CHRONULAC) 10 GM/15ML solution TAKE 45 MLS BY MOUTH TWICE DAILY AS NEEDED  FOR MILD CONSTIPATION. 1892 mL 3  . NONFORMULARY OR COMPOUNDED [place] compound:  Authorized Substitiute Pain Cream - Ibuprofen 15%, Baclofen 1%, Gabapentin 3%, Lidocaine 2%, dispense 120 grams, apply 1-2 grams to affected area 3-4 times daily, +3Refills. 120 each 3  . omeprazole (PRILOSEC) 20 MG capsule TAKE 1 CAPSULE(20 MG) BY MOUTH DAILY 90 capsule 3  . ONE TOUCH ULTRA TEST test strip USE TWICE DAILY TO CHECK BLOOD SUGAR AS DIRECTED 100 each 4  . ranitidine (ZANTAC) 150 MG tablet Take 1 tablet (150 mg total) by mouth 2 (two) times daily. 30 tablet 5  . sodium chloride (OCEAN) 0.65 % SOLN nasal spray Place 1 spray into both nostrils as needed for congestion. 30 mL 0  . spironolactone (ALDACTONE) 50 MG tablet Take 2 tabletsl daily for a  total of 100 mg 60 tablet 3  . umeclidinium-vilanterol (ANORO ELLIPTA) 62.5-25 MCG/INH AEPB Inhale 1 puff into the lungs daily. (Patient taking differently: Inhale 1 puff into the lungs daily. To use after advair rx runs out) 1 each 3   No current facility-administered medications on file prior to visit.     Review of Systems  Constitutional:   No  weight loss, night sweats,  Fevers, chills,  +fatigue, or  lassitude.  HEENT:   No headaches,  Difficulty swallowing,  Tooth/dental problems, or  Sore throat,                No sneezing, itching, ear ache, nasal congestion, post nasal drip,   CV:  No chest pain,  Orthopnea, PND, swelling in lower extremities, anasarca, dizziness, palpitations, syncope.   GI  No heartburn, indigestion, abdominal pain, nausea, vomiting, diarrhea, change in bowel habits, loss of appetite, bloody stools.   Resp:   No chest wall deformity  Skin: no rash or lesions.  GU: no dysuria, change in color of urine, no urgency or frequency.  No flank pain, no hematuria   MS:  No joint pain or swelling.  No decreased range of motion.  No back pain.  Psych:  No change in mood or affect. No depression or anxiety.  No memory loss.          Objective:   Physical Exam  Vitals:   02/27/16 0911  BP: 122/64  Pulse: 74  Temp: 97.5 F (36.4 C)  TempSrc: Oral  SpO2: 96%  Weight: 156 lb (70.8 kg)  Height: 5\' 6"  (1.676 m)    GEN: A/Ox3; pleasant , NAD, chronically ill appearing , elderly    HEENT:  [place]/AT,  EACs-clear, TMs-wnl, NOSE-clear, THROAT-clear, no lesions, no postnasal drip or exudate noted.   NECK:  Supple w/ fair ROM; no JVD; normal carotid impulses w/o bruits; no thyromegaly or nodules palpated; no lymphadenopathy.    RESP  Decreased BS in bases ,  no accessory muscle use, no dullness to percussion  CARD:  RRR, no m/r/g  , tr-1+ peripheral edema, pulses intact, no cyanosis or clubbing.  GI:   Soft & nt; nml bowel sounds; no organomegaly or masses  detected.   Musco: Warm bil, no deformities or joint swelling noted.   Neuro: alert, no focal deficits noted.    Skin: Warm, no lesions or rashes   Tammy Parrett NP-C  [facility]  02/27/2016

## 2016-02-27 NOTE — Patient Instructions (Addendum)
Begin Symbicort 160 2 puffs Twice daily   Begin Spiriva daily  Rinse after all inhalers.  Refer to pulmonary rehab.  Keep working on quitting smoking .  Follow up with Dr. Elsworth Soho  In 2-3 months and As needed   Please contact office for sooner follow up if symptoms do not improve or worsen or seek emergency care

## 2016-02-27 NOTE — Addendum Note (Signed)
Addended by: Mathis Dad on: 02/27/2016 09:54 AM   Modules accepted: Orders

## 2016-02-27 NOTE — Assessment & Plan Note (Signed)
Compensated Med changes due to insurance coverage [facility] but is not covered with insurance   Plan  Begin Symbicort 160 2 puffs Twice daily   Begin Spiriva daily  Rinse after all inhalers.  Refer to pulmonary rehab.  Keep working on quitting smoking .  Follow up with Dr. Elsworth Soho  In 2-3 months and As needed   Please contact office for sooner follow up if symptoms do not improve or worsen or seek emergency care

## 2016-02-27 NOTE — Telephone Encounter (Addendum)
Pt sent an e-mail requesting that we do a PA on Anoro - pt has tried and failed Breo 100, Advair 250 and Spiriva HH.  Pt is requesting to be on Anoro.  Pt was seen today with TP and the medications were changed at this OV  Obstructive chronic bronchitis without exacerbation COPD gold stage C.    Compensated Med changes due to insurance coverage [facility] but is not covered with insurance   Plan  Begin Symbicort 160 2 puffs Twice daily   Begin Spiriva daily  Rinse after all inhalers.  Refer to pulmonary rehab.  Keep working on quitting smoking .  Follow up with Dr. Elsworth Soho  In 2-3 months and As needed   Please contact office for sooner follow up if symptoms do not improve or worsen or seek emergency care      Per patient's request a PA has been submitted to insurance [id number], opt 3, opt 1) and we will have a determination within 24 hours.  Member ID# [id number] Will hold in triage to await PA determination. If approved, will then need to ask TP if the patient is to continue either of the 2 inhalers while on the Anoro.

## 2016-02-28 NOTE — Telephone Encounter (Signed)
Received fax from [place]:  Anoro has been approved from 02/27/16 - 07/26/16  Called [facility] and advised them that Anoro has been approved.  Pharmacy stated that after running through, the cost is still $150.   Attempted to contact patient, left message to call back.

## 2016-03-02 ENCOUNTER — Other Ambulatory Visit: Payer: Self-pay | Admitting: Family Medicine

## 2016-03-02 ENCOUNTER — Telehealth: Payer: Self-pay | Admitting: Pulmonary Disease

## 2016-03-02 DIAGNOSIS — J449 Chronic obstructive pulmonary disease, unspecified: Secondary | ICD-10-CM

## 2016-03-02 MED ORDER — LACTULOSE 10 GM/15ML PO SOLN: ORAL | 6 refills | Status: DC

## 2016-03-02 NOTE — Telephone Encounter (Signed)
Order for pulm rehab has been placed.  

## 2016-03-02 NOTE — Telephone Encounter (Signed)
We will need a new Order for Pulmonary Rehab placed under Dr. Bari Mantis name.  I spoke with [facility] @Pulm  Rehab and she is aware of this.

## 2016-03-02 NOTE — Telephone Encounter (Signed)
[facility] pulmonary rehab is asking for the order to be re-faxed with true signature from RA.  Please advise. thanks

## 2016-03-06 ENCOUNTER — Encounter: Payer: Self-pay | Admitting: Pulmonary Disease

## 2016-03-06 MED ORDER — UMECLIDINIUM-VILANTEROL 62.5-25 MCG/INH IN AEPB: 1.0000 | INHALATION_SPRAY | Freq: Every day | RESPIRATORY_TRACT | 0 refills | Status: AC

## 2016-03-06 NOTE — Telephone Encounter (Signed)
Patient is calling to see if she could come to the [place] office to pick up some Anoro samples today.  CB is [phone].

## 2016-03-06 NOTE — Telephone Encounter (Signed)
Spoke with pt, aware that Anoro samples have been left up front in [place] office.  Nothing further needed.

## 2016-03-08 ENCOUNTER — Encounter: Payer: Self-pay | Admitting: Family Medicine

## 2016-03-08 ENCOUNTER — Encounter: Payer: Self-pay | Admitting: Gastroenterology

## 2016-03-08 DIAGNOSIS — Z0000 Encounter for general adult medical examination without abnormal findings: Secondary | ICD-10-CM

## 2016-03-08 HISTORY — DX: Encounter for general adult medical examination without abnormal findings: Z00.00

## 2016-03-08 NOTE — Assessment & Plan Note (Signed)
Encouraged complete cessation. Discussed need to quit as relates to risk of numerous cancers, cardiac and pulmonary disease as well as neurologic complications. Counseled for greater than 3 minutes 

## 2016-03-08 NOTE — Assessment & Plan Note (Signed)
Minimize sodium, elevate feet above heart, diuretics as needed and compression hose encouraged.

## 2016-03-08 NOTE — Assessment & Plan Note (Signed)
hgba1c acceptable, minimize simple carbs. Increase exercise as tolerated. Continue current meds 

## 2016-03-08 NOTE — Assessment & Plan Note (Signed)
Patient encouraged to maintain heart healthy diet, regular exercise, adequate sleep. Consider daily probiotics. Take medications as prescribed. Given and reviewed copy of ACP documents from [place] Secretary of State and encouraged to complete and return 

## 2016-03-08 NOTE — Progress Notes (Signed)
[name], female   DOB: 1940-12-14, 75 y.o.   MRN: [id number]   Subjective:    [name], female    DOB: 03-06-1941, 75 y.o.   MRN: [id number]  Chief Complaint  Patient presents with  . Annual Exam    HPI Patient is in today for annual exam. She is doing well at home. No trouble with ADLs. No recent hospitalization or acute concerns. Continues to smoke and have DOE but it is slightly improved with new inhaler. Notes some pedal edema left > right. Sugars 80-130 in am and 180-220 typically later in the day although she did have one sugar at 285. Denies polyuria or polydipsia. Denies CP/palp/HA/fevers/GI or GU c/o. Taking meds as prescribed  Past Medical History:  Diagnosis Date  . Abnormal finding on liver function   . Anxiety   . Arthritis of both knees 10/01/2013  . Benign paroxysmal positional vertigo 10/01/2013  . Cancer [facility]) breast ca  right  . COPD (chronic obstructive pulmonary disease) ([place]) 10/01/2013  . Depression   . Diabetes mellitus type 2  . Diarrhea 12/30/2014  . Emphysema   . Encephalopathy, hepatic ([place]) 06/07/2014  . Esophageal reflux 10/01/2013  . Hyperlipidemia   . Hyperlipidemia, mixed   . Increased ammonia level 11/25/2014  . NASH (nonalcoholic steatohepatitis) 08/26/2015  . Neck pain 10/01/2013  . Neuropathy (HCC)    feet   . Overactive bladder 12/10/2013  . Panic attacks   . Pedal edema 12/10/2013  . Preventative health care 03/08/2016  . Right knee pain   . Serum ammonia increased ([place]) 11/10/2014  . Tobacco abuse disorder 02/01/2014    Past Surgical History:  Procedure Laterality Date  . APPENDECTOMY  2007  . BREAST SURGERY  2009 right  . CATARACT EXTRACTION     x 2  . [place]  . KNEE SURGERY    . MANDIBLE FRACTURE SURGERY    . PILONIDAL CYST EXCISION    . TONSILLECTOMY      Family History  Problem Relation Age of Onset  . Heart failure Father   . COPD Father   . Arthritis Father 87  . Stroke Mother   .  Arthritis Mother 77  . Hyperlipidemia Mother   . Hypertension Mother   . Diabetes Mother   . Diabetes Sister   . Breast cancer    . Breast cancer Maternal Aunt   . Asthma Maternal Aunt   . Birth defects Maternal Aunt   . Alcohol abuse Maternal Uncle     Social History   Social History  . Marital status: Single    Spouse name: N/A  . Number of children: 0  . Years of education: N/A   Occupational History  . retired Retired   Social History Main Topics  . Smoking status: Current Some Day Smoker    Packs/day: 0.50    Years: 58.00    Types: Cigarettes    Start date: 07/27/1964  . Smokeless tobacco: Never Used     Comment: 1 pack per week, tobacco infor given 12/30/15  . Alcohol use No  . Drug use: No  . Sexual activity: No   Other Topics Concern  . Not on file   Social History Narrative   Lives alone, continues to smoke, no dietary restrictions    Outpatient Medications Prior to Visit  Medication Sig Dispense Refill  . albuterol (ACCUNEB) 1.25 MG/3ML nebulizer solution Take 1 ampule by nebulization as needed  for wheezing.    . B-D UF III MINI PEN NEEDLES 31G X 5 MM MISC USE AS DIRECTED WITH LEVEMIR FLEXPEN 100 each 0  . cetirizine (ZYRTEC) 10 MG tablet Take 10 mg by mouth daily as needed.     . Cholecalciferol (VITAMIN D3) 2000 UNITS TABS Take 1 capsule by mouth daily.     . furosemide (LASIX) 20 MG tablet Take 1 tablet twice daily 60 tablet 1  . guaiFENesin (MUCINEX) 600 MG 12 hr tablet Take 1 tablet (600 mg total) by mouth 2 (two) times daily. (Patient taking differently: Take 600 mg by mouth 2 (two) times daily as needed. ) 40 tablet 0  . Insulin Detemir (LEVEMIR FLEXPEN) 100 UNIT/ML Pen Inject 14 Units into the skin daily at 10 pm. 15 mL 5  . NONFORMULARY OR COMPOUNDED [place] compound:  Authorized Substitiute Pain Cream - Ibuprofen 15%, Baclofen 1%, Gabapentin 3%, Lidocaine 2%, dispense 120 grams, apply 1-2 grams to affected area 3-4 times daily,  +3Refills. 120 each 3  . omeprazole (PRILOSEC) 20 MG capsule TAKE 1 CAPSULE(20 MG) BY MOUTH DAILY 90 capsule 3  . ONE TOUCH ULTRA TEST test strip USE TWICE DAILY TO CHECK BLOOD SUGAR AS DIRECTED 100 each 4  . sodium chloride (OCEAN) 0.65 % SOLN nasal spray Place 1 spray into both nostrils as needed for congestion. 30 mL 0  . spironolactone (ALDACTONE) 50 MG tablet Take 2 tabletsl daily for a total of 100 mg 60 tablet 3  . ADVAIR DISKUS 250-50 MCG/DOSE AEPB INHALE 1 PUFF BY MOUTH TWICE DAILY 60 each 3  . albuterol (PROVENTIL HFA;VENTOLIN HFA) 108 (90 BASE) MCG/ACT inhaler Inhale 2 puffs into the lungs every 6 (six) hours as needed for wheezing or shortness of breath. Only dispense Ventolin 1 Inhaler 3  . insulin aspart (NOVOLOG) 100 UNIT/ML injection Inject into the skin. Sliding scale    . ipratropium-albuterol (DUONEB) 0.5-2.5 (3) MG/3ML SOLN Take 3 mLs by nebulization every 6 (six) hours. Reported on 08/29/2015 (Patient not taking: Reported on 02/27/2016)    . lactulose (CHRONULAC) 10 GM/15ML solution TAKE 45 MLS BY MOUTH TWICE DAILY AS NEEDED FOR MILD CONSTIPATION. 1892 mL 3  . umeclidinium-vilanterol (ANORO ELLIPTA) 62.5-25 MCG/INH AEPB Inhale 1 puff into the lungs daily. (Patient taking differently: Inhale 1 puff into the lungs daily. To use after advair rx runs out) 1 each 3   No facility-administered medications prior to visit.     Allergies  Allergen Reactions  . Citalopram     Irregular heart beat  . Erythromycin     Stomach cramps  . Glimepiride     Elevated ammonia levels  . Prednisone     Increased blood sugars too high  . Versed [Midazolam] Other (See Comments)    Patient stayed confusion stayed 4+days     Review of Systems  Constitutional: Positive for malaise/fatigue. Negative for fever.  HENT: Negative for congestion.   Eyes: Negative for blurred vision.  Respiratory: Positive for cough and shortness of breath.   Cardiovascular: Positive for leg swelling. Negative for  chest pain and palpitations.  Gastrointestinal: Negative for abdominal pain, blood in stool and nausea.  Genitourinary: Positive for frequency. Negative for dysuria.  Musculoskeletal: Negative for falls.  Skin: Negative for rash.  Neurological: Negative for dizziness, loss of consciousness and headaches.  Endo/Heme/Allergies: Negative for environmental allergies.  Psychiatric/Behavioral: Negative for depression. The patient is not nervous/anxious.        Objective:    Physical Exam  Constitutional: She is oriented to person, place, and time. She appears well-developed and well-nourished. No distress.  HENT:  Head: Normocephalic and atraumatic.  Eyes: Conjunctivae are normal.  Neck: Neck supple. No thyromegaly present.  Cardiovascular: Normal rate, regular rhythm and normal heart sounds.   No murmur heard. Pulmonary/Chest: Effort normal and breath sounds normal. No respiratory distress.  Abdominal: Soft. Bowel sounds are normal. She exhibits no distension and no mass. There is no tenderness.  Musculoskeletal: She exhibits no edema.  Lymphadenopathy:    She has no cervical adenopathy.  Neurological: She is alert and oriented to person, place, and time.  Skin: Skin is warm and dry.  Psychiatric: She has a normal mood and affect. Her behavior is normal.    BP (!) 118/52 (BP Location: Left Arm, Patient Position: Sitting, Cuff Size: Normal)   Pulse 78   Temp 97.7 F (36.5 C) (Oral)   Ht '5\' 6"'  (1.676 m)   Wt 158 lb 2 oz (71.7 kg)   SpO2 97%   BMI 25.52 kg/m  Wt Readings from Last 3 Encounters:  02/27/16 156 lb (70.8 kg)  02/25/16 158 lb 2 oz (71.7 kg)  12/30/15 146 lb (66.2 kg)     Lab Results  Component Value Date   WBC 6.8 02/25/2016   HGB 13.9 02/25/2016   HCT 41.6 02/25/2016   PLT 121.0 (L) 02/25/2016   GLUCOSE 189 (H) 02/25/2016   CHOL 187 02/25/2016   TRIG 112.0 02/25/2016   HDL 55.80 02/25/2016   LDLCALC 108 (H) 02/25/2016   ALT 25 02/25/2016   AST 36  02/25/2016   NA 137 02/25/2016   K 4.3 02/25/2016   CL 104 02/25/2016   CREATININE 0.80 02/25/2016   BUN 12 02/25/2016   CO2 27 02/25/2016   TSH 1.39 02/25/2016   INR 1.2 (H) 12/11/2015   HGBA1C 6.3 02/25/2016   MICROALBUR <0.7 03/26/2015    Lab Results  Component Value Date   TSH 1.39 02/25/2016   Lab Results  Component Value Date   WBC 6.8 02/25/2016   HGB 13.9 02/25/2016   HCT 41.6 02/25/2016   MCV 96.2 02/25/2016   PLT 121.0 (L) 02/25/2016   Lab Results  Component Value Date   NA 137 02/25/2016   K 4.3 02/25/2016   CHLORIDE 104 09/25/2015   CO2 27 02/25/2016   GLUCOSE 189 (H) 02/25/2016   BUN 12 02/25/2016   CREATININE 0.80 02/25/2016   BILITOT 2.2 (H) 02/25/2016   ALKPHOS 127 (H) 02/25/2016   AST 36 02/25/2016   ALT 25 02/25/2016   PROT 6.2 02/25/2016   ALBUMIN 3.1 (L) 02/25/2016   CALCIUM 9.3 02/25/2016   ANIONGAP 7 09/25/2015   EGFR 66 (L) 09/25/2015   GFR 74.75 02/25/2016   Lab Results  Component Value Date   CHOL 187 02/25/2016   Lab Results  Component Value Date   HDL 55.80 02/25/2016   Lab Results  Component Value Date   LDLCALC 108 (H) 02/25/2016   Lab Results  Component Value Date   TRIG 112.0 02/25/2016   Lab Results  Component Value Date   CHOLHDL 3 02/25/2016   Lab Results  Component Value Date   HGBA1C 6.3 02/25/2016       Assessment & Plan:   Problem List Items Addressed This Visit    Obstructive chronic bronchitis without exacerbation COPD gold stage C. - Primary    Has an appt with pulmonology later this week. Reports Advair makes her feel more SOB  with this but she tolerates Anoro much better, will discuss with pulmonology, could switch       Relevant Medications   albuterol (PROVENTIL HFA;VENTOLIN HFA) 108 (90 Base) MCG/ACT inhaler   Hyperlipidemia, mixed   Relevant Orders   TSH (Completed)   Lipid panel (Completed)   Thrombocytopenia (HCC)   Relevant Orders   CBC (Completed)   Esophageal reflux    Avoid  offending foods, start probiotics. Do not eat large meals in late evening and consider raising head of bed.       Relevant Medications   ranitidine (ZANTAC) 150 MG tablet   Other Relevant Orders   Comprehensive metabolic panel (Completed)   Tobacco abuse disorder    Encouraged complete cessation. Discussed need to quit as relates to risk of numerous cancers, cardiac and pulmonary disease as well as neurologic complications. Counseled for greater than 3 minutes      Peripheral edema    Minimize sodium, elevate feet above heart, diuretics as needed and compression hose encouraged.       Diabetes mellitus type 2, controlled ([place])   Relevant Medications   insulin aspart (NOVOLOG) 100 UNIT/ML injection   Other Relevant Orders   Comprehensive metabolic panel (Completed)   Microalbumin / creatinine urine ratio   Increased ammonia level   Liver cirrhosis secondary to NASH    Stable and doing well at present      Type 2 diabetes mellitus with hyperglycemia, with long-term current use of insulin (HCC)    hgba1c acceptable, minimize simple carbs. Increase exercise as tolerated. Continue current meds      Relevant Medications   insulin aspart (NOVOLOG) 100 UNIT/ML injection   Other Relevant Orders   Hemoglobin A1c (Completed)   NASH (nonalcoholic steatohepatitis)   Chronic respiratory failure (HCC)    Unfortunately is still smoking and struggles with SOB with exertion etc. Is following with pulmonology and responding to treatment      Muscle spasm   Relevant Orders   Magnesium (Completed)   Preventative health care    Patient encouraged to maintain heart healthy diet, regular exercise, adequate sleep. Consider daily probiotics. Take medications as prescribed. Given and reviewed copy of ACP documents from [place] of State and encouraged to complete and return       Other Visit Diagnoses   None.     I have discontinued Ms. Robello ADVAIR DISKUS. I have also changed her  insulin aspart. Additionally, I am having her start on ranitidine. Lastly, I am having her maintain her Vitamin D3, omeprazole, guaiFENesin, sodium chloride, Insulin Detemir, albuterol, cetirizine, ONE TOUCH ULTRA TEST, NONFORMULARY OR COMPOUNDED ITEM, furosemide, spironolactone, B-D UF III MINI PEN NEEDLES, and albuterol.  Meds ordered this encounter  Medications  . albuterol (PROVENTIL HFA;VENTOLIN HFA) 108 (90 Base) MCG/ACT inhaler    Sig: Inhale 2 puffs into the lungs every 6 (six) hours as needed for wheezing or shortness of breath. Only dispense Ventolin    Dispense:  1 Inhaler    Refill:  3  . insulin aspart (NOVOLOG) 100 UNIT/ML injection    Sig: 4 units SQ q lunch daily and prn Sliding scale:  BS <200 no units BS 201-250 use 2 units BS 251-300 use 4 units BS 301-350 use 6 units BS 351-400 use 8 units BS 401-450 use 10 units BS>451 call MD    Dispense:  10 mL    Refill:  3  . ranitidine (ZANTAC) 150 MG tablet    Sig: Take 1 tablet (  150 mg total) by mouth 2 (two) times daily.    Dispense:  30 tablet    Refill:  5     Penni Homans, MD

## 2016-03-08 NOTE — Assessment & Plan Note (Signed)
Stable and doing well at present

## 2016-03-08 NOTE — Assessment & Plan Note (Signed)
Avoid offending foods, start probiotics. Do not eat large meals in late evening and consider raising head of bed.  

## 2016-03-08 NOTE — Assessment & Plan Note (Signed)
Unfortunately is still smoking and struggles with SOB with exertion etc. Is following with pulmonology and responding to treatment

## 2016-03-09 ENCOUNTER — Encounter: Payer: Self-pay | Admitting: Family Medicine

## 2016-03-23 ENCOUNTER — Telehealth: Payer: Self-pay | Admitting: Pulmonary Disease

## 2016-03-23 DIAGNOSIS — J449 Chronic obstructive pulmonary disease, unspecified: Secondary | ICD-10-CM

## 2016-03-23 NOTE — Telephone Encounter (Signed)
Called and spoke to [facility]. Medicare does not allow a pt to attend pulm rehab unless orders are signed by an MD. TP saw pt last and placed order. Spoke with Sharyn Lull and placed new order for pulm rehab with Dr. Elsworth Soho as the ordering provider. Portia verbalized understanding and denied any further questions or concerns at this time.

## 2016-04-25 ENCOUNTER — Encounter: Payer: Self-pay | Admitting: Family Medicine

## 2016-04-25 ENCOUNTER — Encounter: Payer: Self-pay | Admitting: Pulmonary Disease

## 2016-04-27 ENCOUNTER — Encounter: Payer: Self-pay | Admitting: Family Medicine

## 2016-04-27 ENCOUNTER — Ambulatory Visit (INDEPENDENT_AMBULATORY_CARE_PROVIDER_SITE_OTHER): Payer: PPO | Admitting: Family Medicine

## 2016-04-27 VITALS — BP 116/60 | HR 79 | Temp 97.4°F | Resp 12 | Ht 67.0 in | Wt 156.2 lb

## 2016-04-27 DIAGNOSIS — H9313 Tinnitus, bilateral: Secondary | ICD-10-CM

## 2016-04-27 DIAGNOSIS — Z8679 Personal history of other diseases of the circulatory system: Secondary | ICD-10-CM

## 2016-04-27 DIAGNOSIS — H532 Diplopia: Secondary | ICD-10-CM

## 2016-04-27 NOTE — Telephone Encounter (Signed)
Replied to patient e-mail with additional questions.  Will await response back.

## 2016-04-27 NOTE — Progress Notes (Addendum)
Chief Complaint  Patient presents with  . Eye Problem    Subjective: Patient is a 75 y.o. female here for an episode of dizziness and double vision.  She received a flu vaccination last Thursday and reports waking up and having a sudden bout of double vision and severe ringing in her ears. This happened on Saturday. It lasted for around 15 seconds and she has not had any issues since. She has a baseline ringing in her ears. It is not pulsatile. She denies current vision issues, hearing loss, difficulties w speech or swallowing, balance issues, or one sided weakness.    ROS: Neuro: As noted in HPI Eyes: No vision changes  Family History  Problem Relation Age of Onset  . Heart failure Father   . COPD Father   . Arthritis Father 49  . Stroke Mother   . Arthritis Mother 73  . Hyperlipidemia Mother   . Hypertension Mother   . Diabetes Mother   . Diabetes Sister   . Breast cancer    . Breast cancer Maternal Aunt   . Asthma Maternal Aunt   . Birth defects Maternal Aunt   . Alcohol abuse Maternal Uncle    Past Medical History:  Diagnosis Date  . Anxiety   . Arthritis of both knees 10/01/2013  . Benign paroxysmal positional vertigo 10/01/2013  . Cancer [facility]) breast ca  right  . COPD (chronic obstructive pulmonary disease) ([place]) 10/01/2013  . Depression   . Diabetes mellitus type 2  . Emphysema   . Encephalopathy, hepatic ([place]) 06/07/2014  . Esophageal reflux 10/01/2013  . Hyperlipidemia   . Hyperlipidemia, mixed   . Increased ammonia level 11/25/2014  . NASH (nonalcoholic steatohepatitis) 08/26/2015  . Neck pain 10/01/2013  . Neuropathy (HCC)    feet   . Overactive bladder 12/10/2013  . Panic attacks   . Pedal edema 12/10/2013  . Preventative health care 03/08/2016  . Tobacco abuse disorder 02/01/2014   Allergies  Allergen Reactions  . Citalopram     Irregular heart beat  . Erythromycin     Stomach cramps  . Glimepiride     Elevated ammonia levels  . Prednisone     Increased  blood sugars too high  . Versed [Midazolam] Other (See Comments)    Patient stayed confusion stayed 4+days     Current Outpatient Prescriptions:  .  albuterol (ACCUNEB) 1.25 MG/3ML nebulizer solution, Take 1 ampule by nebulization as needed for wheezing., Disp: , Rfl:  .  albuterol (PROVENTIL HFA;VENTOLIN HFA) 108 (90 Base) MCG/ACT inhaler, Inhale 2 puffs into the lungs every 6 (six) hours as needed for wheezing or shortness of breath. Only dispense Ventolin, Disp: 1 Inhaler, Rfl: 3 .  B-D UF III MINI PEN NEEDLES 31G X 5 MM MISC, USE AS DIRECTED WITH LEVEMIR FLEXPEN, Disp: 100 each, Rfl: 0 .  cetirizine (ZYRTEC) 10 MG tablet, Take 10 mg by mouth daily as needed. , Disp: , Rfl:  .  Cholecalciferol (VITAMIN D3) 2000 UNITS TABS, Take 1 capsule by mouth daily. , Disp: , Rfl:  .  furosemide (LASIX) 20 MG tablet, Take 1 tablet twice daily, Disp: 60 tablet, Rfl: 1 .  insulin aspart (NOVOLOG) 100 UNIT/ML injection, 4 units SQ q lunch daily and prn Sliding scale:  BS <200 no units BS 201-250 use 2 units BS 251-300 use 4 units BS 301-350 use 6 units BS 351-400 use 8 units BS 401-450 use 10 units BS>451 call MD, Disp: 10 mL, Rfl: 3 .  Insulin Detemir (LEVEMIR FLEXPEN) 100 UNIT/ML Pen, Inject 14 Units into the skin daily at 10 pm., Disp: 15 mL, Rfl: 5 .  lactulose (CHRONULAC) 10 GM/15ML solution, TAKE 45 MLS BY MOUTH TWICE DAILY AS NEEDED FOR MILD CONSTIPATION., Disp: 1892 mL, Rfl: 6 .  NONFORMULARY OR COMPOUNDED ITEM, [place] compound:  Authorized Substitiute Pain Cream - Ibuprofen 15%, Baclofen 1%, Gabapentin 3%, Lidocaine 2%, dispense 120 grams, apply 1-2 grams to affected area 3-4 times daily, +3Refills., Disp: 120 each, Rfl: 3 .  omeprazole (PRILOSEC) 20 MG capsule, TAKE 1 CAPSULE(20 MG) BY MOUTH DAILY, Disp: 90 capsule, Rfl: 3 .  ONE TOUCH ULTRA TEST test strip, USE TWICE DAILY TO CHECK BLOOD SUGAR AS DIRECTED, Disp: 100 each, Rfl: 4 .  ranitidine (ZANTAC) 150 MG tablet, Take 1 tablet (150 mg  total) by mouth 2 (two) times daily., Disp: 30 tablet, Rfl: 5 .  sodium chloride (OCEAN) 0.65 % SOLN nasal spray, Place 1 spray into both nostrils as needed for congestion., Disp: 30 mL, Rfl: 0 .  spironolactone (ALDACTONE) 50 MG tablet, Take 2 tabletsl daily for a total of 100 mg, Disp: 60 tablet, Rfl: 3 .  tiotropium (SPIRIVA HANDIHALER) 18 MCG inhalation capsule, Place 1 capsule (18 mcg total) into inhaler and inhale daily., Disp: 30 capsule, Rfl: 12 .  guaiFENesin (MUCINEX) 600 MG 12 hr tablet, Take 1 tablet (600 mg total) by mouth 2 (two) times daily. (Patient not taking: Reported on 04/27/2016), Disp: 40 tablet, Rfl: 0 .  Insulin Syringe-Needle U-100 31G X 5/16" 1 ML MISC, To use w/ Novolog (Patient not taking: Reported on 04/27/2016), Disp: 100 each, Rfl: 12  Objective: BP 116/60   Pulse 79   Temp 97.4 F (36.3 C)   Resp 12   Ht 5\' 7"  (1.702 m)   Wt 156 lb 3.2 oz (70.9 kg)   BMI 24.46 kg/m  General: Awake, appears stated age HEENT: MMM, EOMi, PERRLA Heart: RRR, no murmurs, I did not appreciate any bruits Lungs: CTAB, no rales, wheezes or rhonchi. Normal effort Neuro: CN2-12 intact, no facial droop, fluent speech, 1/4 biceps reflex b/l wo clonus, 0/4 patellar reflex b/l wo clonus, 1/4 calcaneal reflex b/l without clonus, sensation intact to light touch MSK: 5/5 strength throughout with exception of 4/5 strength with knee flexion and extension due to advanced osteoarthritis, gait normal Psych: Age appropriate judgment and insight, normal affect and mood, speech is goal oriented  Assessment and Plan: Tinnitus of both ears  Double vision  History of carotid stenosis - Plan: US Carotid Duplex Bilateral  Orders as above. Asymptomatic currently with normal exam. History does not suggest anything sinister as everything was bilateral and has since resolved. She does have an eye exam coming up with a specialist. Will f/u on above imaging. As this took place nearly 48 hours ago, will  forego brain imaging. Warning signs to seek emergent care given in writing.  Given hx of anxiety and the tempo of onset to presentation, a functional deficit is most likely at this time. F/u prn. The patient voiced understanding and agreement to the plan.  [place], DO 04/27/16  5:10 PM

## 2016-04-27 NOTE — Telephone Encounter (Signed)
RA, pt asking to take spiriva respimat alone  She states symbicort did not help and made her blood sugar too high  She has been taking spiriva alone and doing fine  Please advise thanks

## 2016-04-27 NOTE — Telephone Encounter (Signed)
Can we get a hold of this patient and let her know that she should not use my chart for things like this it is just a fluke that I had time to look at this. Then get her an appt with someone for evaluation today. If she cannot get to one of [place] she needs to go to ER?  ----- Message -----  From: Jamesetta Orleans, CMA  Sent: 04/27/2016  9:11 AM  To: Mosie Lukes, MD  Subject: FW: Non-Urgent Medical Question           Flu shot documented in chart.  ----- Message -----  From: [name]  Sent: 04/25/2016  4:39 PM  To: [facility] Clinical Pool  Subject: Non-Urgent Medical Question             I took a flu shot on Thursday at [facility]. I want to make an appointment with you ASAP. This morning I had a sudden bout with double vision and severe ringing in my ears. It only lasted about 15 seconds but scaired the hell out me. I need help.      Called patient and left a message for call back.  Also sent a message via [place].

## 2016-04-27 NOTE — Patient Instructions (Signed)
Go to ER if this happens again. Also look out for difficulty swallowing, difficulty with speech, balance issues or once sided weakness.

## 2016-04-30 ENCOUNTER — Encounter: Payer: Self-pay | Admitting: Pulmonary Disease

## 2016-05-01 MED ORDER — TIOTROPIUM BROMIDE MONOHYDRATE 2.5 MCG/ACT IN AERS: 2.0000 | INHALATION_SPRAY | Freq: Every day | RESPIRATORY_TRACT | 6 refills | Status: DC

## 2016-05-04 ENCOUNTER — Encounter (HOSPITAL_COMMUNITY): Payer: Self-pay

## 2016-05-04 ENCOUNTER — Encounter (HOSPITAL_COMMUNITY)
Admission: RE | Admit: 2016-05-04 | Discharge: 2016-05-04 | Disposition: A | Discharge status: Home / Self Care | Payer: PPO | Source: Ambulatory Visit | Attending: Pulmonary Disease | Admitting: Pulmonary Disease

## 2016-05-04 VITALS — BP 115/63 | HR 72 | Ht 67.0 in | Wt 159.0 lb

## 2016-05-04 DIAGNOSIS — J449 Chronic obstructive pulmonary disease, unspecified: Secondary | ICD-10-CM | POA: Insufficient documentation

## 2016-05-04 DIAGNOSIS — J42 Unspecified chronic bronchitis: Secondary | ICD-10-CM

## 2016-05-04 NOTE — Progress Notes (Signed)
Ashley Savage 75 y.o. female Pulmonary Rehab Orientation Note Patient arrived today in Cardiac and Pulmonary Rehab for orientation to Pulmonary Rehab. She was transported from [employer] via wheel chair. She does not carry portable oxygen. Per pt, she uses oxygen never. Color good, skin warm and dry. Patient is oriented to time and place. Patient's medical history, psychosocial health, and medications reviewed. Psychosocial assessment reveals pt lives alone. Pt is currently retired. Pt hobbies include puzzles and sodoku crossword puzzles. Pt reports her stress level is low. Areas of stress/anxiety include Family. She has a sister who is in a wheelchair who cannot help her , but she has a niece who lives in [place], [place] who is a support if she were to need it.  She has another niece who is addicted to drugs.   Pt does not exhibit signs of depression.  PHQ2/9 score 0/0. Pt shows good  coping skills with positive outlook . Offered emotional support and reassurance. No psychosocial issues identified. Physical assessment reveals heart rate is normal, breath sounds clear to auscultation, no wheezes, rales, or rhonchi. Grip strength equal, strong. Distal pulses 2+ bilateral posterior tibial pulses present with 3+ pitting edema to both ankle and and calf areas.  Encouraged to take her diuretics as presecribed and elevate them when seated. Patient reports she does take medications as prescribed. Patient states she follows a Diabetic diet. The patient has been trying to gain weight by eating more small frequent meals... Patient's weight will be monitored closely. Demonstration and practice of PLB using pulse oximeter. Patient able to return demonstration satisfactorily. Safety and hand hygiene in the exercise area reviewed with patient. Patient voices understanding of the information reviewed. Department expectations discussed with patient and achievable goals were set. The patient shows enthusiasm about attending the  program and we look forward to working with this nice lady. The patient is scheduled for a 6 min walk test on Thursday, May 07, 2016 @ 3:30 pm and to begin exercise on Thursday, May 14, 2016 in the 1:30 pm class.  [id number]

## 2016-05-06 DIAGNOSIS — H04123 Dry eye syndrome of bilateral lacrimal glands: Secondary | ICD-10-CM | POA: Diagnosis not present

## 2016-05-06 DIAGNOSIS — E119 Type 2 diabetes mellitus without complications: Secondary | ICD-10-CM | POA: Diagnosis not present

## 2016-05-06 LAB — HM DIABETES EYE EXAM

## 2016-05-07 ENCOUNTER — Encounter (HOSPITAL_COMMUNITY)
Admission: RE | Admit: 2016-05-07 | Discharge: 2016-05-07 | Disposition: A | Discharge status: Home / Self Care | Payer: PPO | Source: Ambulatory Visit | Attending: Pulmonary Disease | Admitting: Pulmonary Disease

## 2016-05-07 NOTE — Progress Notes (Signed)
Pulmonary Individual Treatment Plan  Patient Details  Name: [place] MRN: [id number] Date of Birth: 25-Jan-1941 Referring Provider:   [name] Pulmonary Rehab Walk Test from 05/07/2016 in [place]  Referring Provider  Dr. Elsworth Soho      Initial Encounter Date:  Flowsheet Row Pulmonary Rehab Walk Test from 05/07/2016 in [place]  Date  05/07/16  Referring Provider  Dr. Elsworth Soho      Visit Diagnosis: No diagnosis found.  Patient's Home Medications on Admission:   Current Outpatient Prescriptions:  .  albuterol (ACCUNEB) 1.25 MG/3ML nebulizer solution, Take 1 ampule by nebulization as needed for wheezing., Disp: , Rfl:  .  albuterol (PROVENTIL HFA;VENTOLIN HFA) 108 (90 Base) MCG/ACT inhaler, Inhale 2 puffs into the lungs every 6 (six) hours as needed for wheezing or shortness of breath. Only dispense Ventolin, Disp: 1 Inhaler, Rfl: 3 .  B-D UF III MINI PEN NEEDLES 31G X 5 MM MISC, USE AS DIRECTED WITH LEVEMIR FLEXPEN, Disp: 100 each, Rfl: 0 .  cetirizine (ZYRTEC) 10 MG tablet, Take 10 mg by mouth daily as needed. , Disp: , Rfl:  .  Cholecalciferol (VITAMIN D3) 2000 UNITS TABS, Take 1 capsule by mouth daily. , Disp: , Rfl:  .  furosemide (LASIX) 20 MG tablet, Take 1 tablet twice daily, Disp: 60 tablet, Rfl: 1 .  guaiFENesin (MUCINEX) 600 MG 12 hr tablet, Take 1 tablet (600 mg total) by mouth 2 (two) times daily., Disp: 40 tablet, Rfl: 0 .  insulin aspart (NOVOLOG) 100 UNIT/ML injection, 4 units SQ q lunch daily and prn Sliding scale:  BS <200 no units BS 201-250 use 2 units BS 251-300 use 4 units BS 301-350 use 6 units BS 351-400 use 8 units BS 401-450 use 10 units BS>451 call MD, Disp: 10 mL, Rfl: 3 .  Insulin Detemir (LEVEMIR FLEXPEN) 100 UNIT/ML Pen, Inject 14 Units into the skin daily at 10 pm., Disp: 15 mL, Rfl: 5 .  Insulin Syringe-Needle U-100 31G X 5/16" 1 ML MISC, To use w/ Novolog, Disp: 100 each, Rfl: 12 .   lactulose (CHRONULAC) 10 GM/15ML solution, TAKE 45 MLS BY MOUTH TWICE DAILY AS NEEDED FOR MILD CONSTIPATION., Disp: 1892 mL, Rfl: 6 .  NONFORMULARY OR COMPOUNDED ITEM, [place] compound:  Authorized Substitiute Pain Cream - Ibuprofen 15%, Baclofen 1%, Gabapentin 3%, Lidocaine 2%, dispense 120 grams, apply 1-2 grams to affected area 3-4 times daily, +3Refills. (Patient not taking: Reported on 05/04/2016), Disp: 120 each, Rfl: 3 .  omeprazole (PRILOSEC) 20 MG capsule, TAKE 1 CAPSULE(20 MG) BY MOUTH DAILY (Patient not taking: Reported on 05/04/2016), Disp: 90 capsule, Rfl: 3 .  ONE TOUCH ULTRA TEST test strip, USE TWICE DAILY TO CHECK BLOOD SUGAR AS DIRECTED, Disp: 100 each, Rfl: 4 .  ranitidine (ZANTAC) 150 MG tablet, Take 1 tablet (150 mg total) by mouth 2 (two) times daily., Disp: 30 tablet, Rfl: 5 .  sodium chloride (OCEAN) 0.65 % SOLN nasal spray, Place 1 spray into both nostrils as needed for congestion., Disp: 30 mL, Rfl: 0 .  spironolactone (ALDACTONE) 50 MG tablet, Take 2 tabletsl daily for a total of 100 mg, Disp: 60 tablet, Rfl: 3 .  tiotropium (SPIRIVA HANDIHALER) 18 MCG inhalation capsule, Place 1 capsule (18 mcg total) into inhaler and inhale daily. (Patient not taking: Reported on 05/04/2016), Disp: 30 capsule, Rfl: 12 .  Tiotropium Bromide Monohydrate (SPIRIVA RESPIMAT) 2.5 MCG/ACT AERS, Inhale 2 puffs into the lungs  daily., Disp: 1 Inhaler, Rfl: 6  Past Medical History: Past Medical History:  Diagnosis Date  . Anxiety   . Arthritis of both knees 10/01/2013  . Benign paroxysmal positional vertigo 10/01/2013  . Cancer [facility]) breast ca  right  . COPD (chronic obstructive pulmonary disease) ([place]) 10/01/2013  . Depression   . Diabetes mellitus type 2  . Emphysema   . Encephalopathy, hepatic ([place]) 06/07/2014  . Esophageal reflux 10/01/2013  . Hyperlipidemia   . Hyperlipidemia, mixed   . Increased ammonia level 11/25/2014  . NASH (nonalcoholic steatohepatitis) 08/26/2015  . Neck pain  10/01/2013  . Neuropathy (HCC)    feet   . Overactive bladder 12/10/2013  . Panic attacks   . Pedal edema 12/10/2013  . Preventative health care 03/08/2016  . Tobacco abuse disorder 02/01/2014    Tobacco Use: History  Smoking Status  . Current Some Day Smoker  . Packs/day: 0.50  . Years: 58.00  . Types: Cigarettes  . Start date: 07/27/1964  Smokeless Tobacco  . Never Used    Comment: 1 pack per week, tobacco infor given 12/30/15    Labs: Recent Review Flowsheet Data    Labs for ITP Cardiac and Pulmonary Rehab Latest Ref Rng & Units 10/15/2014 03/26/2015 07/03/2015 08/26/2015 02/25/2016   Cholestrol 0 - 200 mg/dL - 151 - 157 187   LDLCALC 0 - 99 mg/dL - 93 - 107(H) 108(H)   HDL >39.00 mg/dL - 42.90 - 38.10(L) 55.80   Trlycerides 0.0 - 149.0 mg/dL - 74.0 - 58.0 112.0   Hemoglobin A1c 4.6 - 6.5 % - 6.6(H) 6.5 - 6.3   PHART 7.350 - 7.450 7.426 - - - -   PCO2ART 35.0 - 45.0 mmHg 37.2 - - - -   HCO3 20.0 - 24.0 mEq/L 24.5(H) - - - -   TCO2 0 - 100 mmol/L 26 - - - -   O2SAT % 95.0 - - - -      Capillary Blood Glucose: Lab Results  Component Value Date   GLUCAP 150 (H) 08/01/2015   GLUCAP 240 (H) 08/01/2015   GLUCAP 154 (H) 08/01/2015   GLUCAP 162 (H) 08/01/2015   GLUCAP 185 (H) 08/01/2015     ADL UCSD:   Pulmonary Function Assessment:     Pulmonary Function Assessment - 05/04/16 1047      Post Bronchodilator Spirometry Results   FVC% 1.85 %   FEV1% 40 %   FEV1/FVC Ratio 54      Exercise Target Goals: Date: 05/07/16  Exercise Program Goal: Individual exercise prescription set with THRR, safety & activity barriers. Participant demonstrates ability to understand and report RPE using BORG scale, to self-measure pulse accurately, and to acknowledge the importance of the exercise prescription.  Exercise Prescription Goal: Starting with aerobic activity 30 plus minutes a day, 3 days per week for initial exercise prescription. Provide home exercise prescription and guidelines  that participant acknowledges understanding prior to discharge.  Activity Barriers & Risk Stratification:     Activity Barriers & Cardiac Risk Stratification - 05/04/16 1027      Activity Barriers & Cardiac Risk Stratification   Activity Barriers Arthritis;Deconditioning;Balance Concerns      6 Minute Walk:     6 Minute Walk    Row Name 05/07/16 1618         6 Minute Walk   Phase Initial     Distance 832 feet     Walk Time 6 minutes     # of Rest  Breaks 0     MPH 1.57     METS 2.23     RPE 11     Perceived Dyspnea  1     Symptoms Yes (comment)     Comments knee pain     Resting HR 87 bpm     Resting BP 100/62     Max Ex. HR 94 bpm     Max Ex. BP 130/60       Interval HR   Baseline HR 87     1 Minute HR 88     2 Minute HR 94     3 Minute HR 94     4 Minute HR 90     5 Minute HR 92     6 Minute HR 91     2 Minute Post HR 90     Interval Heart Rate? Yes       Interval Oxygen   Interval Oxygen? Yes     Baseline Oxygen Saturation % 94 %     Baseline Liters of Oxygen 9 L     1 Minute Oxygen Saturation % 94 %     1 Minute Liters of Oxygen 0 L     2 Minute Oxygen Saturation % 92 %     2 Minute Liters of Oxygen 0 L     3 Minute Oxygen Saturation % 92 %     3 Minute Liters of Oxygen 0 L     4 Minute Oxygen Saturation % 94 %     4 Minute Liters of Oxygen 0 L     5 Minute Oxygen Saturation % 95 %     5 Minute Liters of Oxygen 0 L     6 Minute Oxygen Saturation % 96 %     6 Minute Liters of Oxygen 0 L     2 Minute Post Oxygen Saturation % 97 %     2 Minute Post Liters of Oxygen 0 L        Initial Exercise Prescription:     Initial Exercise Prescription - 05/07/16 1600      Date of Initial Exercise RX and Referring Provider   Date 05/07/16   Referring Provider Dr. Elsworth Soho     NuStep   Level 2   Minutes 17   METs 1.5     Arm Ergometer   Level 1   Minutes 17     Track   Laps 5   Minutes 17     Prescription Details   Frequency (times per week) 2    Duration Progress to 45 minutes of aerobic exercise without signs/symptoms of physical distress     Intensity   THRR 40-80% of Max Heartrate 59-118   Ratings of Perceived Exertion 11-13   Perceived Dyspnea 0-4     Progression   Progression Continue progressive overload as per policy without signs/symptoms or physical distress.     Resistance Training   Training Prescription Yes   Weight green bands   Reps 10-12      Perform Capillary Blood Glucose checks as needed.  Exercise Prescription Changes:   Exercise Comments:   Discharge Exercise Prescription (Final Exercise Prescription Changes):    Nutrition:  Target Goals: Understanding of nutrition guidelines, daily intake of sodium 1500mg , cholesterol 200mg , calories 30% from fat and 7% or less from saturated fats, daily to have 5 or more servings of fruits and vegetables.  Biometrics:     Pre Biometrics - 05/04/16  1037      Pre Biometrics   Grip Strength 29 kg       Nutrition Therapy Plan and Nutrition Goals:   Nutrition Discharge: Rate Your Plate Scores:   Psychosocial: Target Goals: Acknowledge presence or absence of depression, maximize coping skills, provide positive support system. Participant is able to verbalize types and ability to use techniques and skills needed for reducing stress and depression.  Initial Review & Psychosocial Screening:     Initial Psych Review & Screening - 05/04/16 [place]? No  Not the best support system, but has one   Strains Intra-family strains;Illness and family care strain  sister in wheel chair, neice is helpful, but lives in [place], [place] is drug abuser.     Barriers   Psychosocial barriers to participate in program There are no identifiable barriers or psychosocial needs.     Screening Interventions   Interventions Encouraged to exercise      Quality of Life Scores:   PHQ-9: Recent Review Flowsheet Data     Depression screen [facility] 2/9 05/04/2016 02/25/2016 02/08/2015 02/08/2015 12/26/2014   Decreased Interest 0 0 - 0 0   Down, Depressed, Hopeless 0 0 1 - 0   PHQ - 2 Score 0 0 1 0 0      Psychosocial Evaluation and Intervention:     Psychosocial Evaluation - 05/04/16 1044      Psychosocial Evaluation & Interventions   Interventions Encouraged to exercise with the program and follow exercise prescription   Continued Psychosocial Services Needed No      Psychosocial Re-Evaluation:  Education: Education Goals: Education classes will be provided on a weekly basis, covering required topics. Participant will state understanding/return demonstration of topics presented.  Learning Barriers/Preferences:     Learning Barriers/Preferences - 05/04/16 1031      Learning Barriers/Preferences   Learning Barriers None   Learning Preferences Written Material;Video;Verbal Instruction;Skilled Demonstration;Pictoral;Individual Instruction;Group Instruction;Computer/Internet;Audio      Education Topics: Risk Factor Reduction:  -Group instruction that is supported by a PowerPoint presentation. Instructor discusses the definition of a risk factor, different risk factors for pulmonary disease, and how the heart and lungs work together.     Nutrition for Pulmonary Patient:  -Group instruction provided by PowerPoint slides, verbal discussion, and written materials to support subject matter. The instructor gives an explanation and review of healthy diet recommendations, which includes a discussion on weight management, recommendations for fruit and vegetable consumption, as well as protein, fluid, caffeine, fiber, sodium, sugar, and alcohol. Tips for eating when patients are short of breath are discussed.   Pursed Lip Breathing:  -Group instruction that is supported by demonstration and informational handouts. Instructor discusses the benefits of pursed lip and diaphragmatic breathing and detailed demonstration  on how to preform both.     Oxygen Safety:  -Group instruction provided by PowerPoint, verbal discussion, and written material to support subject matter. There is an overview of "What is Oxygen" and "Why do we need it".  Instructor also reviews how to create a safe environment for oxygen use, the importance of using oxygen as prescribed, and the risks of noncompliance. There is a brief discussion on traveling with oxygen and resources the patient may utilize.   Oxygen Equipment:  -Group instruction provided by [facility] Staff utilizing handouts, written materials, and equipment demonstrations.   Signs and Symptoms:  -Group instruction provided by written material and verbal discussion to support subject matter.  Warning signs and symptoms of infection, stroke, and heart attack are reviewed and when to call the physician/911 reinforced. Tips for preventing the spread of infection discussed.   Advanced Directives:  -Group instruction provided by verbal instruction and written material to support subject matter. Instructor reviews Advanced Directive laws and proper instruction for filling out document.   Pulmonary Video:  -Group video education that reviews the importance of medication and oxygen compliance, exercise, good nutrition, pulmonary hygiene, and pursed lip and diaphragmatic breathing for the pulmonary patient.   Exercise for the Pulmonary Patient:  -Group instruction that is supported by a PowerPoint presentation. Instructor discusses benefits of exercise, core components of exercise, frequency, duration, and intensity of an exercise routine, importance of utilizing pulse oximetry during exercise, safety while exercising, and options of places to exercise outside of rehab.     Pulmonary Medications:  -Verbally interactive group education provided by instructor with focus on inhaled medications and proper administration.   Anatomy and Physiology of the Respiratory System and  Intimacy:  -Group instruction provided by PowerPoint, verbal discussion, and written material to support subject matter. Instructor reviews respiratory cycle and anatomical components of the respiratory system and their functions. Instructor also reviews differences in obstructive and restrictive respiratory diseases with examples of each. Intimacy, Sex, and Sexuality differences are reviewed with a discussion on how relationships can change when diagnosed with pulmonary disease. Common sexual concerns are reviewed.   Knowledge Questionnaire Score:   Core Components/Risk Factors/Patient Goals at Admission:     Personal Goals and Risk Factors at Admission - 05/04/16 1038      Core Components/Risk Factors/Patient Goals on Admission   Increase Strength and Stamina Yes   Intervention Provide advice, education, support and counseling about physical activity/exercise needs.;Develop an individualized exercise prescription for aerobic and resistive training based on initial evaluation findings, risk stratification, comorbidities and participant's personal goals.   Expected Outcomes Achievement of increased cardiorespiratory fitness and enhanced flexibility, muscular endurance and strength shown through measurements of functional capacity and personal statement of participant.   Tobacco Cessation Yes   Number of packs per day 0-2 cigarettes/day   Intervention Assist the participant in steps to quit. Provide individualized education and counseling about committing to Tobacco Cessation, relapse prevention, and pharmacological support that can be provided by physician.;Advice worker, assist with locating and accessing local/national Quit Smoking programs, and support quit date choice.   Expected Outcomes Short Term: Will quit all tobacco product use, adhering to prevention of relapse plan.   Improve shortness of breath with ADL's Yes   Intervention Provide education, individualized exercise  plan and daily activity instruction to help decrease symptoms of SOB with activities of daily living.   Expected Outcomes Short Term: Achieves a reduction of symptoms when performing activities of daily living.   Develop more efficient breathing techniques such as purse lipped breathing and diaphragmatic breathing; and practicing self-pacing with activity Yes   Intervention Provide education, demonstration and support about specific breathing techniuqes utilized for more efficient breathing. Include techniques such as pursed lipped breathing, diaphragmatic breathing and self-pacing activity.   Expected Outcomes Short Term: Participant will be able to demonstrate and use breathing techniques as needed throughout daily activities.   Increase knowledge of respiratory medications and ability to use respiratory devices properly  Yes      Core Components/Risk Factors/Patient Goals Review:    Core Components/Risk Factors/Patient Goals at Discharge (Final Review):    ITP Comments:   Comments:

## 2016-05-13 ENCOUNTER — Other Ambulatory Visit: Payer: Self-pay | Admitting: Family Medicine

## 2016-05-13 ENCOUNTER — Encounter: Payer: Self-pay | Admitting: Pulmonary Disease

## 2016-05-14 ENCOUNTER — Encounter (HOSPITAL_COMMUNITY)
Admission: RE | Admit: 2016-05-14 | Discharge: 2016-05-14 | Disposition: A | Discharge status: Home / Self Care | Payer: PPO | Source: Ambulatory Visit | Attending: Pulmonary Disease | Admitting: Pulmonary Disease

## 2016-05-14 VITALS — Wt 158.3 lb

## 2016-05-14 DIAGNOSIS — J42 Unspecified chronic bronchitis: Secondary | ICD-10-CM

## 2016-05-14 DIAGNOSIS — J449 Chronic obstructive pulmonary disease, unspecified: Secondary | ICD-10-CM | POA: Diagnosis not present

## 2016-05-14 NOTE — Progress Notes (Signed)
Daily Session Note  Patient Details  Name: [place] Savage MRN: [id number] Date of Birth: Dec 01, 1940 Referring Provider:   [name] Pulmonary Rehab Walk Test from 05/07/2016 in [place]  Referring Provider  Dr. Elsworth Soho      Encounter Date: 05/14/2016  Check In:     Session Check In - 05/14/16 1330      Check-In   Location [facility]   Staff Present Su Hilt, MS, ACSM RCEP, Exercise Physiologist;Joan Leonia Reeves, RN, Luisa Hart, RN, BSN   Supervising physician immediately available to respond to emergencies [facility] immediately available   Physician(s) Dr. Rockne Menghini   Medication changes reported     No   Fall or balance concerns reported    No   Warm-up and Cool-down Performed as group-led instruction   Resistance Training Performed Yes   VAD Patient? No     Pain Assessment   Currently in Pain? No/denies   Multiple Pain Sites No      Capillary Blood Glucose: No results found for this or any previous visit (from the past 24 hour(s)).     POCT Glucose - 05/14/16 1612      POCT Blood Glucose   Pre-Exercise 173 mg/dL   Post-Exercise 142 mg/dL         Exercise Prescription Changes - 05/14/16 1600      Response to Exercise   Blood Pressure (Admit) 98/42   Blood Pressure (Exercise) 136/66   Blood Pressure (Exit) 104/60   Heart Rate (Admit) 70 bpm   Heart Rate (Exercise) 85 bpm   Heart Rate (Exit) 80 bpm   Oxygen Saturation (Admit) 97 %   Oxygen Saturation (Exercise) 95 %   Oxygen Saturation (Exit) 95 %   Rating of Perceived Exertion (Exercise) 15   Perceived Dyspnea (Exercise) 2   Duration Progress to 45 minutes of aerobic exercise without signs/symptoms of physical distress   Intensity --  40-80% HRR     Resistance Training   Training Prescription Yes   Weight green bands  10 min   Reps 10-12     Oxygen   Oxygen --  room air     Arm Ergometer   Level 1   Minutes 17     Track   Laps 8   Minutes 17     Goals Met:  Exercise tolerated well Queuing for purse lip breathing No report of cardiac concerns or symptoms Strength training completed today  Goals Unmet:  Not Applicable  Comments: Service time is from 1330 to 1515   Dr. Rush Farmer is Medical Director for Pulmonary Rehab at [facility].

## 2016-05-15 IMAGING — CR DG CHEST 2V
2 series · 2 of 2 positions shown · non-contrast
Comparison: 05/31/2014

CLINICAL DATA: Cough x3 years, altered mental status

EXAM:
CHEST  2 VIEW

[w chest pa]
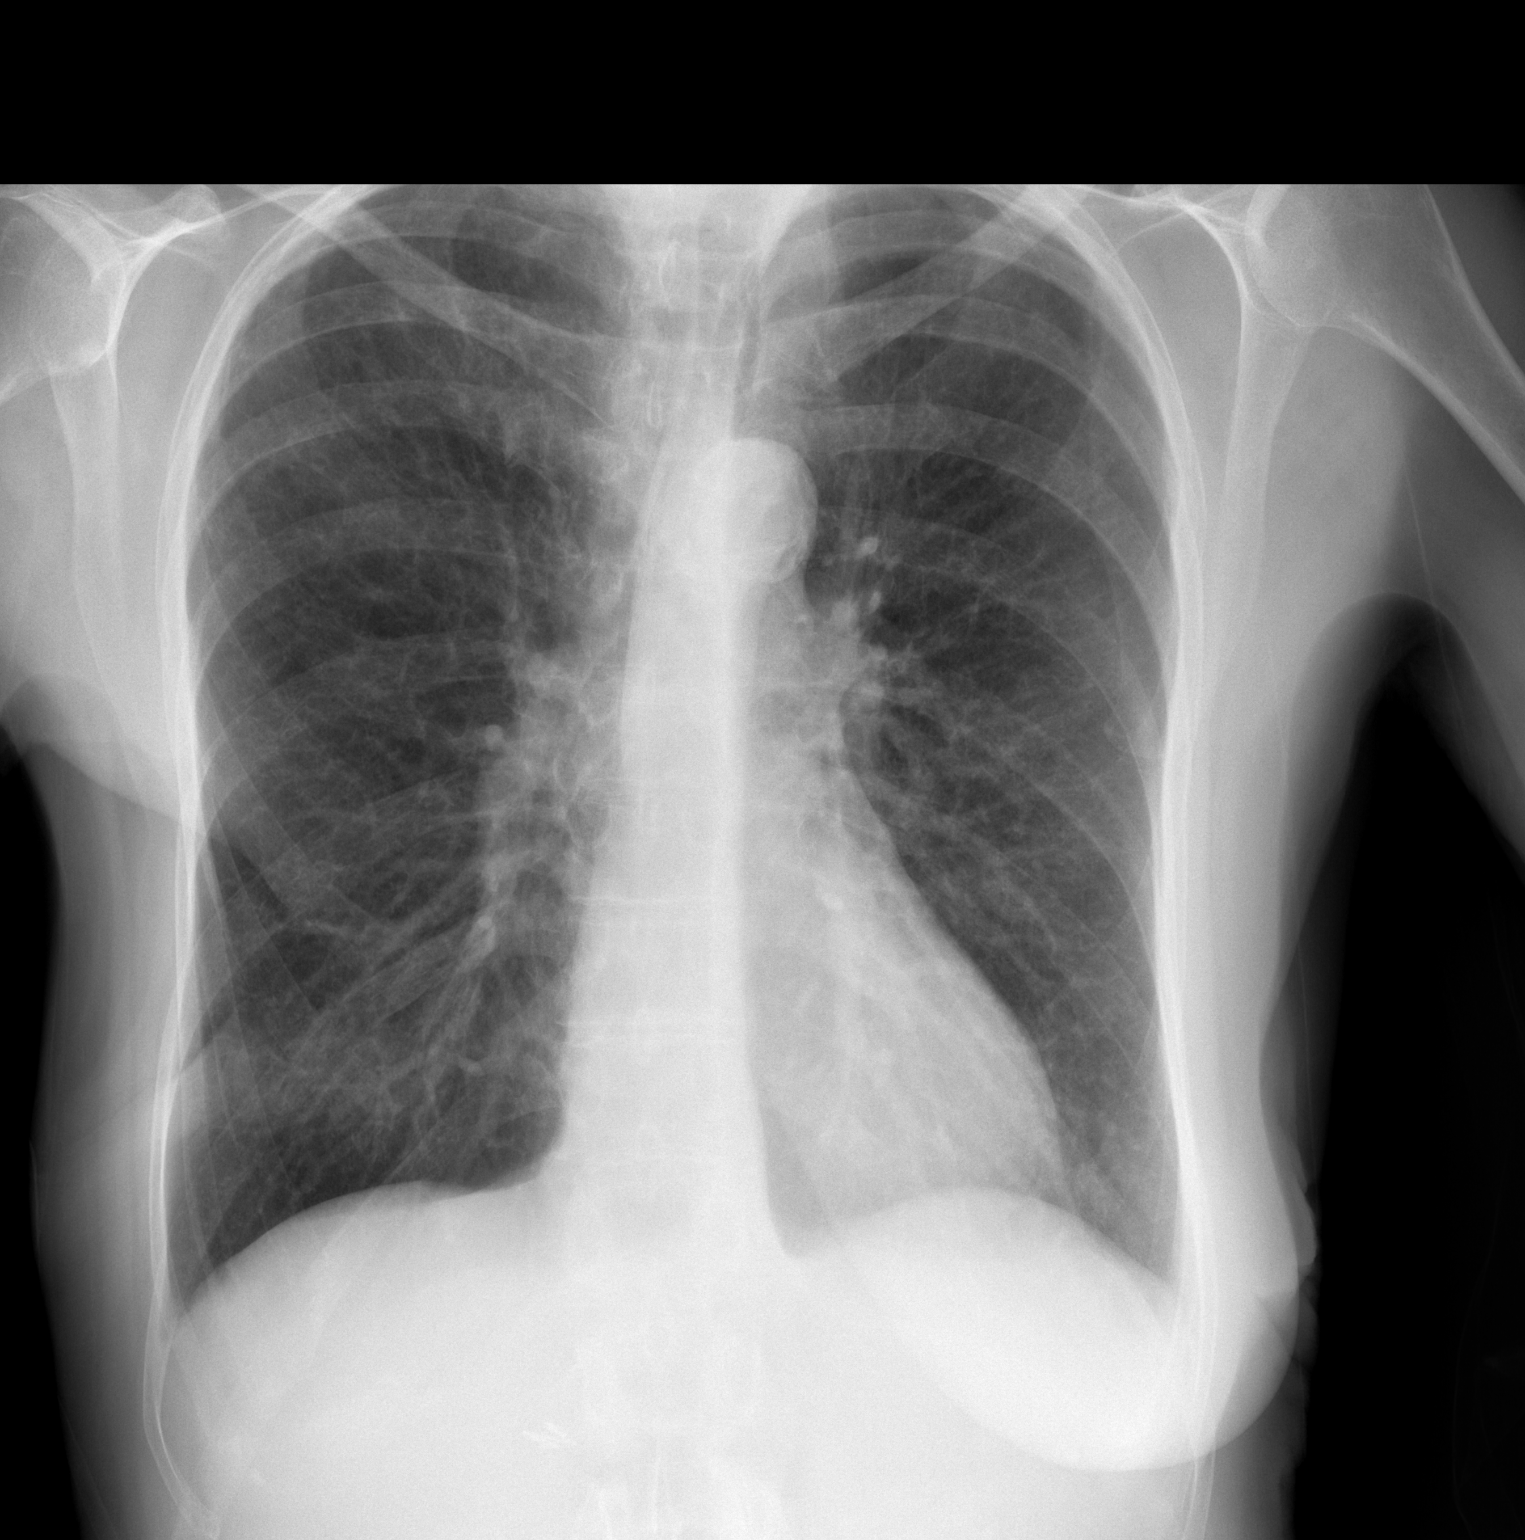

[w chest lat]
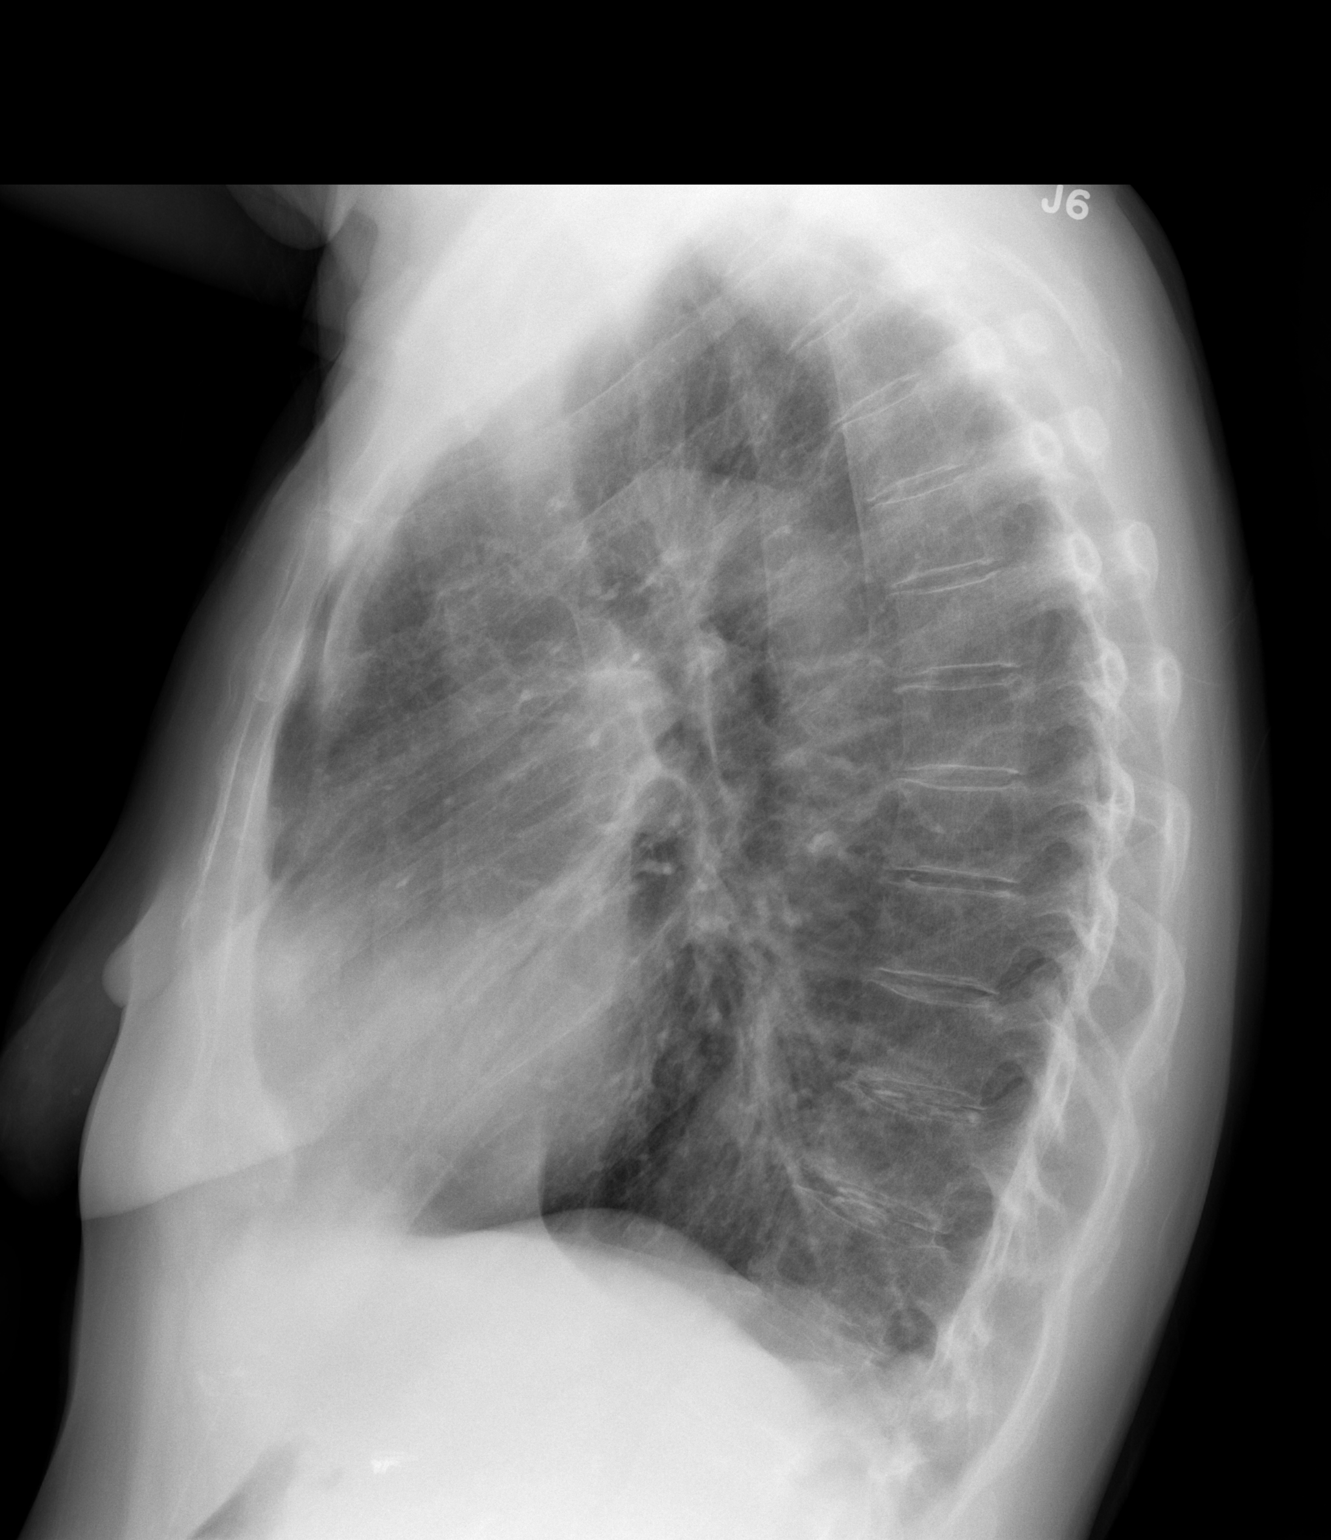

[2 of 2 positions shown; findings below may reference images not displayed]

FINDINGS: Chronic interstitial markings/ emphysematous changes. No focal
consolidation. No pleural effusion or pneumothorax.

The heart is normal in size.

Visualized osseous structures are within normal limits.
IMPRESSION: No active cardiopulmonary disease.

## 2016-05-18 ENCOUNTER — Encounter: Payer: Self-pay | Admitting: Family Medicine

## 2016-05-19 ENCOUNTER — Encounter: Payer: Self-pay | Admitting: Pulmonary Disease

## 2016-05-19 ENCOUNTER — Encounter (HOSPITAL_COMMUNITY)
Admission: RE | Admit: 2016-05-19 | Discharge: 2016-05-19 | Disposition: A | Discharge status: Home / Self Care | Payer: PPO | Source: Ambulatory Visit | Attending: Pulmonary Disease | Admitting: Pulmonary Disease

## 2016-05-19 VITALS — Wt 156.1 lb

## 2016-05-19 DIAGNOSIS — J42 Unspecified chronic bronchitis: Secondary | ICD-10-CM

## 2016-05-19 NOTE — Progress Notes (Signed)
Incomplete Session Note  Patient Details  Name: [place] MRN: [id number] Date of Birth: Sep 19, 1940 Referring Provider:   [name] Pulmonary Rehab Walk Test from 05/07/2016 in [place]  Referring Provider  Dr. Queen Slough did not complete her rehab session.  She took Zyrtec this morning and feels very drowsy and did not feel like exercising today.

## 2016-05-20 ENCOUNTER — Encounter (HOSPITAL_COMMUNITY): Payer: Self-pay | Admitting: *Deleted

## 2016-05-21 ENCOUNTER — Encounter (HOSPITAL_COMMUNITY)
Admission: RE | Admit: 2016-05-21 | Discharge: 2016-05-21 | Disposition: A | Discharge status: Home / Self Care | Payer: PPO | Source: Ambulatory Visit | Attending: Pulmonary Disease | Admitting: Pulmonary Disease

## 2016-05-21 ENCOUNTER — Encounter: Payer: Self-pay | Admitting: Pulmonary Disease

## 2016-05-21 ENCOUNTER — Telehealth: Payer: Self-pay

## 2016-05-21 ENCOUNTER — Ambulatory Visit (INDEPENDENT_AMBULATORY_CARE_PROVIDER_SITE_OTHER): Payer: PPO | Admitting: Pulmonary Disease

## 2016-05-21 VITALS — Wt 158.3 lb

## 2016-05-21 DIAGNOSIS — J449 Chronic obstructive pulmonary disease, unspecified: Secondary | ICD-10-CM | POA: Diagnosis not present

## 2016-05-21 DIAGNOSIS — J42 Unspecified chronic bronchitis: Secondary | ICD-10-CM

## 2016-05-21 MED ORDER — TIOTROPIUM BROMIDE MONOHYDRATE 1.25 MCG/ACT IN AERS: 2.0000 | INHALATION_SPRAY | Freq: Every day | RESPIRATORY_TRACT | 0 refills | Status: DC

## 2016-05-21 MED ORDER — TIOTROPIUM BROMIDE MONOHYDRATE 1.25 MCG/ACT IN AERS: 2.0000 | INHALATION_SPRAY | Freq: Every day | RESPIRATORY_TRACT | 5 refills | Status: DC

## 2016-05-21 MED ORDER — DOXYCYCLINE HYCLATE 100 MG PO TABS: 100.0000 mg | ORAL_TABLET | Freq: Every day | ORAL | 0 refills | Status: DC

## 2016-05-21 NOTE — Assessment & Plan Note (Signed)
We'll treat as flare   Doxycycline 100 mg daily for 7 days  Take Mucinex twice daily for 7 days Instead of Spiriva, trial of STIOLTO 2 puffs daily- call us for prescription if this works   try to avoid prednisone here    Continue pulmonary rehabilitation

## 2016-05-21 NOTE — Telephone Encounter (Signed)
LM for pt in regards to continuing spiriva as stiolto is not preferred by insurance per RA (verbally) Advise pt to give [place] a call back with any questions or concerns. Nothing further needed at this time.

## 2016-05-21 NOTE — Progress Notes (Signed)
   Subjective:    [name], female    DOB: 1941/01/11, 75 y.o.   MRN: [id number]  HPI  75 year old female, smoker with gold C COPD NASH - (sees Dr Silverio Decamp) H/o breast CA - ennever, also thrombocytopenia  05/21/2016  Chief Complaint  Patient presents with  . Acute Visit    c/o cough-white,gray,thick  and hoarseness x 1 wk.Increased sob since started, no wheezing.Denies cp or tightness, no fcs   She continues to smoke about a pack per week She reports cough occasionally yellow for the last 4-5 days, no fevers or wheezing, breathing is slightly worse She transiently required oxygen last year, now off She is on Spiriva-Advair has fallen off her list  she is currently undergoing pulmonary rehabilitation    1/2017admitted  for COPD flare.  tx w/  Steroids. Started Oxygen at discharge.  Admitted to [facility] 08/05/15 for altered mental status felt secondary to hyperamonianemia from missed lactulose doses.        08/2015 ONO >> 2L [place]  Test Spirometry July 2014 with FEV1 of 41%, ratio 45, FVC 68% Spirometry 10/2015 shows FEV1 53%, ratio 61    Review of Systems neg for any significant sore throat, dysphagia, itching, sneezing, nasal congestion or excess/ purulent secretions, fever, chills, sweats, unintended wt loss, pleuritic or exertional cp, hempoptysis, orthopnea pnd or change in chronic leg swelling. Also denies presyncope, palpitations, heartburn, abdominal pain, nausea, vomiting, diarrhea or change in bowel or urinary habits, dysuria,hematuria, rash, arthralgias, visual complaints, headache, numbness weakness or ataxia.     Objective:   Physical Exam   Gen. Pleasant, well-nourished, in no distress, elderly ENT - no lesions, no post nasal drip Neck: No JVD, no thyromegaly, no carotid bruits Lungs: no use of accessory muscles, no dullness to percussion, decreased without rales or rhonchi  Cardiovascular: Rhythm regular, heart sounds  normal, no murmurs or  gallops, no peripheral edema Musculoskeletal: No deformities, no cyanosis or clubbing , cane        Assessment & Plan:

## 2016-05-21 NOTE — Progress Notes (Signed)
Daily Session Note  Patient Details  Name: SEVANA GRANDINETTI MRN: [id number] Date of Birth: 25-May-1941 Referring Provider:   [name] Pulmonary Rehab Walk Test from 05/07/2016 in [place]  Referring Provider  Dr. Elsworth Soho      Encounter Date: 05/21/2016  Check In:     Session Check In - 05/21/16 1338      Check-In   Location [facility]   Staff Present Su Hilt, MS, ACSM RCEP, Exercise Physiologist;Delawrence Fridman Leonia Reeves, RN, BSN;Lisa Ysidro Evert, RN;Portia Rollene Rotunda, RN, BSN   Supervising physician immediately available to respond to emergencies [facility] immediately available   Physician(s) Dr. Alfredia Ferguson   Medication changes reported     No   Fall or balance concerns reported    No   Warm-up and Cool-down Performed as group-led instruction   Resistance Training Performed Yes   VAD Patient? No     Pain Assessment   Currently in Pain? No/denies   Multiple Pain Sites No      Capillary Blood Glucose: No results found for this or any previous visit (from the past 24 hour(s)).     POCT Glucose - 05/21/16 1633      POCT Blood Glucose   Pre-Exercise 220 mg/dL   Post-Exercise 140 mg/dL         Exercise Prescription Changes - 05/21/16 1600      Exercise Review   Progression Yes     Response to Exercise   Blood Pressure (Admit) 104/60   Blood Pressure (Exercise) 110/64   Blood Pressure (Exit) 114/60   Heart Rate (Admit) 73 bpm   Heart Rate (Exercise) 79 bpm   Heart Rate (Exit) 80 bpm   Oxygen Saturation (Admit) 94 %   Oxygen Saturation (Exercise) 95 %   Oxygen Saturation (Exit) 91 %   Rating of Perceived Exertion (Exercise) 13   Perceived Dyspnea (Exercise) 1   Duration Progress to 45 minutes of aerobic exercise without signs/symptoms of physical distress   Intensity THRR unchanged     Progression   Progression Continue to progress workloads to maintain intensity without signs/symptoms of physical distress.     Resistance Training   Training Prescription Yes   Weight green bands   Reps 10-12  10 minutes of strength training     Interval Training   Interval Training No     NuStep   Level 2   Minutes 17   METs 1.3     Arm Ergometer   Level 1   Minutes 17     Goals Met:  Exercise tolerated well Strength training completed today  Goals Unmet:  Not Applicable  Comments: Service time is from 1330 to 1530.  Attended the question and answer session with Dr. Nelda Marseille.    Dr. Rush Farmer is Medical Director for Pulmonary Rehab at [facility].

## 2016-05-21 NOTE — Patient Instructions (Signed)
Doxycycline 100 mg daily for 7 days  Take Mucinex twice daily for 7 days Instead of Spiriva, trial of STIOLTO 2 puffs daily- call us for prescription if this works

## 2016-05-21 NOTE — Assessment & Plan Note (Signed)
   Discussed smoking cessation. 

## 2016-05-26 ENCOUNTER — Encounter (HOSPITAL_COMMUNITY)
Admission: RE | Admit: 2016-05-26 | Discharge: 2016-05-26 | Disposition: A | Discharge status: Home / Self Care | Payer: PPO | Source: Ambulatory Visit | Attending: Pulmonary Disease | Admitting: Pulmonary Disease

## 2016-05-26 DIAGNOSIS — J449 Chronic obstructive pulmonary disease, unspecified: Secondary | ICD-10-CM | POA: Diagnosis not present

## 2016-05-26 NOTE — Progress Notes (Signed)
Daily Session Note  Patient Details  Name: Ashley Savage MRN: [id number] Date of Birth: 24-Jul-1941 Referring Provider:   [name] Pulmonary Rehab Walk Test from 05/07/2016 in [place]  Referring Provider  Dr. Elsworth Soho      Encounter Date: 05/26/2016  Check In:     Session Check In - 05/26/16 1607      Check-In   Location [facility]   Staff Present Rosebud Poles, RN, BSN;Molly diVincenzo, MS, ACSM RCEP, Exercise Physiologist;Ewart Carrera Ysidro Evert, RN   Supervising physician immediately available to respond to emergencies [facility] immediately available   Physician(s) Dr. Alfredia Ferguson   Medication changes reported     No   Fall or balance concerns reported    No   Warm-up and Cool-down Performed as group-led instruction   Resistance Training Performed Yes   VAD Patient? No     Pain Assessment   Currently in Pain? No/denies   Multiple Pain Sites No      Capillary Blood Glucose: No results found for this or any previous visit (from the past 24 hour(s)).     POCT Glucose - 05/26/16 1608      POCT Blood Glucose   Pre-Exercise 171 mg/dL   Post-Exercise 131 mg/dL         Exercise Prescription Changes - 05/26/16 1600      Response to Exercise   Blood Pressure (Admit) 102/50   Blood Pressure (Exercise) 130/50   Blood Pressure (Exit) 100/40   Heart Rate (Admit) 71 bpm   Heart Rate (Exercise) 81 bpm   Heart Rate (Exit) 75 bpm   Oxygen Saturation (Admit) 94 %   Oxygen Saturation (Exercise) 93 %   Oxygen Saturation (Exit) 91 %   Rating of Perceived Exertion (Exercise) 11   Perceived Dyspnea (Exercise) 2   Duration Progress to 45 minutes of aerobic exercise without signs/symptoms of physical distress   Intensity THRR unchanged     Progression   Progression Continue to progress workloads to maintain intensity without signs/symptoms of physical distress.     Resistance Training   Training Prescription Yes   Weight green  bands   Reps 10-12  10 minutes of strength training     Interval Training   Interval Training No     NuStep   Minutes 17   METs 1.7     Arm Ergometer   Level 1   Minutes 17     Track   Laps 8   Minutes 17     Goals Met:  Exercise tolerated well No report of cardiac concerns or symptoms Strength training completed today  Goals Unmet:  Not Applicable  Comments: Service time is from 1330 to 1515     Dr. Rush Farmer is Medical Director for Pulmonary Rehab at [facility].

## 2016-05-28 ENCOUNTER — Encounter (HOSPITAL_COMMUNITY)
Admission: RE | Admit: 2016-05-28 | Discharge: 2016-05-28 | Disposition: A | Discharge status: Home / Self Care | Payer: PPO | Source: Ambulatory Visit | Attending: Pulmonary Disease | Admitting: Pulmonary Disease

## 2016-05-28 VITALS — Wt 159.2 lb

## 2016-05-28 DIAGNOSIS — J449 Chronic obstructive pulmonary disease, unspecified: Secondary | ICD-10-CM | POA: Insufficient documentation

## 2016-05-28 DIAGNOSIS — J42 Unspecified chronic bronchitis: Secondary | ICD-10-CM

## 2016-05-28 NOTE — Progress Notes (Signed)
Pt completed Quality of Life survey as a participant in Pulmonary Rehab. Scores 21.0 or below are considered low. Pt score very low in several areas Overall 14.62, Health and Function 14.69, physiological and spiritual 16.36, family 0.  Manuela Schwartz does not have a supportive family, except for a niece that she is very close to and wouldd help her if needed.  She is very isolated and is enjoying coming to the class and being around others.  She is hoping the social and physical components of pulmonary rehab will improve her qol.

## 2016-05-28 NOTE — Progress Notes (Signed)
Daily Session Note  Patient Details  Name: Ashley Savage MRN: [id number] Date of Birth: 12/08/1940 Referring Provider:   [name] Pulmonary Rehab Walk Test from 05/07/2016 in [place]  Referring Provider  Dr. Elsworth Soho      Encounter Date: 05/28/2016  Check In:     Session Check In - 05/28/16 1330      Check-In   Location [facility]   Staff Present Rosebud Poles, RN, BSN;Molly diVincenzo, MS, ACSM RCEP, Exercise Physiologist;Lisa Ysidro Evert, RN;Jadie Allington Rollene Rotunda, RN, BSN   Supervising physician immediately available to respond to emergencies [facility] immediately available   Physician(s) Dr. Elder Love   Medication changes reported     No   Fall or balance concerns reported    No   Warm-up and Cool-down Performed as group-led instruction   Resistance Training Performed Yes   VAD Patient? No     Pain Assessment   Currently in Pain? No/denies   Multiple Pain Sites No      Capillary Blood Glucose: No results found for this or any previous visit (from the past 24 hour(s)).     POCT Glucose - 05/28/16 1650      POCT Blood Glucose   Pre-Exercise 192 mg/dL         Exercise Prescription Changes - 05/28/16 1647      Exercise Review   Progression Yes     Response to Exercise   Blood Pressure (Admit) 110/50   Blood Pressure (Exercise) 124/70   Blood Pressure (Exit) 112/68   Heart Rate (Admit) 76 bpm   Heart Rate (Exercise) 92 bpm   Heart Rate (Exit) 84 bpm   Oxygen Saturation (Admit) 97 %   Oxygen Saturation (Exercise) 96 %   Oxygen Saturation (Exit) 84 %   Rating of Perceived Exertion (Exercise) 11   Perceived Dyspnea (Exercise) 2   Duration Progress to 45 minutes of aerobic exercise without signs/symptoms of physical distress   Intensity THRR unchanged     Progression   Progression Continue to progress workloads to maintain intensity without signs/symptoms of physical distress.     Resistance Training   Training  Prescription Yes   Weight green bands   Reps 10-12  10 minutes of strength training     Interval Training   Interval Training No     NuStep   Level 3   Minutes 17   METs 2.4     Track   Laps 9   Minutes 17     Goals Met:  Exercise tolerated well Queuing for purse lip breathing No report of cardiac concerns or symptoms Strength training completed today  Goals Unmet:  Not Applicable  Comments: Service time is from 1330 to 1540   Dr. Rush Farmer is Medical Director for Pulmonary Rehab at [facility].

## 2016-06-02 ENCOUNTER — Encounter (HOSPITAL_COMMUNITY)
Admission: RE | Admit: 2016-06-02 | Discharge: 2016-06-02 | Disposition: A | Discharge status: Home / Self Care | Payer: PPO | Source: Ambulatory Visit | Attending: Pulmonary Disease | Admitting: Pulmonary Disease

## 2016-06-02 VITALS — Wt 158.3 lb

## 2016-06-02 DIAGNOSIS — J42 Unspecified chronic bronchitis: Secondary | ICD-10-CM

## 2016-06-02 DIAGNOSIS — J449 Chronic obstructive pulmonary disease, unspecified: Secondary | ICD-10-CM | POA: Diagnosis not present

## 2016-06-02 NOTE — Progress Notes (Signed)
Daily Session Note  Patient Details  Name: Ashley Savage MRN: [id number] Date of Birth: [birth year] Referring Provider:   [name] Pulmonary Rehab Walk Test from 05/07/2016 in [place]  Referring Provider  Dr. Elsworth Soho      Encounter Date: 06/02/2016  Check In:     Session Check In - 06/02/16 1535      Check-In   Location [facility]   Staff Present Rosebud Poles, RN, BSN;Molly diVincenzo, MS, ACSM RCEP, Exercise Physiologist;Lisa Ysidro Evert, Felipe Drone, RN, MHA;Portia Rollene Rotunda, RN, BSN   Supervising physician immediately available to respond to emergencies [facility] immediately available   Physician(s) Dr. Lonny Prude   Medication changes reported     No   Fall or balance concerns reported    No   Warm-up and Cool-down Performed as group-led instruction   Resistance Training Performed Yes   VAD Patient? No     Pain Assessment   Currently in Pain? No/denies   Multiple Pain Sites No      Capillary Blood Glucose: No results found for this or any previous visit (from the past 24 hour(s)).     POCT Glucose - 06/02/16 1550      POCT Blood Glucose   Pre-Exercise 179 mg/dL   Post-Exercise 127 mg/dL         Exercise Prescription Changes - 06/02/16 1500      Response to Exercise   Blood Pressure (Admit) 104/50   Blood Pressure (Exercise) 100/60   Blood Pressure (Exit) 100/50   Heart Rate (Admit) 80 bpm   Heart Rate (Exercise) 107 bpm   Heart Rate (Exit) 89 bpm   Oxygen Saturation (Admit) 93 %   Oxygen Saturation (Exercise) 88 %   Oxygen Saturation (Exit) 93 %   Rating of Perceived Exertion (Exercise) 13   Perceived Dyspnea (Exercise) 1   Duration Progress to 45 minutes of aerobic exercise without signs/symptoms of physical distress   Intensity THRR unchanged     Progression   Progression Continue to progress workloads to maintain intensity without signs/symptoms of physical distress.     Resistance  Training   Training Prescription Yes   Weight green bands   Reps 10-12  10 minutes of strength training     Interval Training   Interval Training No     NuStep   Level 3   Minutes 17   METs 1.5     Arm Ergometer   Level 1   Minutes 17     Track   Laps 9   Minutes 17     Goals Met:  Exercise tolerated well Strength training completed today  Goals Unmet:  Not Applicable  Comments: Service time is from 1330 to 1510    Dr. Rush Farmer is Medical Director for Pulmonary Rehab at [facility].

## 2016-06-02 NOTE — Progress Notes (Signed)
Pulmonary Individual Treatment Plan  Patient Details  Name: Ashley Savage MRN: [id number] Date of Birth: [birth year] Referring Provider:   [name] Pulmonary Rehab Walk Test from 05/07/2016 in [place]  Referring Provider  Dr. Elsworth Soho      Initial Encounter Date:  Flowsheet Row Pulmonary Rehab Walk Test from 05/07/2016 in [place]  Date  05/07/16  Referring Provider  Dr. Elsworth Soho      Visit Diagnosis: Chronic bronchitis, unspecified chronic bronchitis type ([place])  Patient's Home Medications on Admission:   Current Outpatient Prescriptions:  .  albuterol (ACCUNEB) 1.25 MG/3ML nebulizer solution, Take 1 ampule by nebulization as needed for wheezing., Disp: , Rfl:  .  albuterol (PROVENTIL HFA;VENTOLIN HFA) 108 (90 Base) MCG/ACT inhaler, Inhale 2 puffs into the lungs every 6 (six) hours as needed for wheezing or shortness of breath. Only dispense Ventolin, Disp: 1 Inhaler, Rfl: 3 .  BD PEN NEEDLE NANO U/F 32G X 4 MM MISC, USE AS DIRECTED WITH LEVEMIR FLEXPEN, Disp: 100 each, Rfl: 0 .  cetirizine (ZYRTEC) 10 MG tablet, Take 10 mg by mouth daily as needed. , Disp: , Rfl:  .  Cholecalciferol (VITAMIN D3) 2000 UNITS TABS, Take 1 capsule by mouth daily. , Disp: , Rfl:  .  doxycycline (VIBRA-TABS) 100 MG tablet, Take 1 tablet (100 mg total) by mouth daily., Disp: 7 tablet, Rfl: 0 .  furosemide (LASIX) 20 MG tablet, Take 1 tablet twice daily, Disp: 60 tablet, Rfl: 1 .  guaiFENesin (MUCINEX) 600 MG 12 hr tablet, Take 1 tablet (600 mg total) by mouth 2 (two) times daily., Disp: 40 tablet, Rfl: 0 .  insulin aspart (NOVOLOG) 100 UNIT/ML injection, 4 units SQ q lunch daily and prn Sliding scale:  BS <200 no units BS 201-250 use 2 units BS 251-300 use 4 units BS 301-350 use 6 units BS 351-400 use 8 units BS 401-450 use 10 units BS>451 call MD, Disp: 10 mL, Rfl: 3 .  Insulin Detemir (LEVEMIR FLEXPEN) 100 UNIT/ML Pen, Inject 14 Units into  the skin daily at 10 pm., Disp: 15 mL, Rfl: 5 .  Insulin Syringe-Needle U-100 31G X 5/16" 1 ML MISC, To use w/ Novolog, Disp: 100 each, Rfl: 12 .  lactulose (CHRONULAC) 10 GM/15ML solution, TAKE 45 MLS BY MOUTH TWICE DAILY AS NEEDED FOR MILD CONSTIPATION., Disp: 1892 mL, Rfl: 6 .  NONFORMULARY OR COMPOUNDED ITEM, [place] compound:  Authorized Substitiute Pain Cream - Ibuprofen 15%, Baclofen 1%, Gabapentin 3%, Lidocaine 2%, dispense 120 grams, apply 1-2 grams to affected area 3-4 times daily, +3Refills. (Patient not taking: Reported on 05/21/2016), Disp: 120 each, Rfl: 3 .  omeprazole (PRILOSEC) 20 MG capsule, TAKE 1 CAPSULE(20 MG) BY MOUTH DAILY, Disp: 90 capsule, Rfl: 3 .  ONE TOUCH ULTRA TEST test strip, USE TWICE DAILY TO CHECK BLOOD SUGAR AS DIRECTED, Disp: 100 each, Rfl: 4 .  ranitidine (ZANTAC) 150 MG tablet, Take 1 tablet (150 mg total) by mouth 2 (two) times daily. (Patient not taking: Reported on 05/21/2016), Disp: 30 tablet, Rfl: 5 .  sodium chloride (OCEAN) 0.65 % SOLN nasal spray, Place 1 spray into both nostrils as needed for congestion., Disp: 30 mL, Rfl: 0 .  spironolactone (ALDACTONE) 50 MG tablet, Take 2 tabletsl daily for a total of 100 mg, Disp: 60 tablet, Rfl: 3 .  Tiotropium Bromide Monohydrate (SPIRIVA RESPIMAT) 1.25 MCG/ACT AERS, Inhale 2 puffs into the lungs daily., Disp: 1 Inhaler, Rfl: 0 .  Tiotropium Bromide Monohydrate (SPIRIVA RESPIMAT) 1.25 MCG/ACT AERS, Inhale 2 puffs into the lungs daily., Disp: 1 Inhaler, Rfl: 5  Past Medical History: Past Medical History:  Diagnosis Date  . Anxiety   . Arthritis of both knees 10/01/2013  . Benign paroxysmal positional vertigo 10/01/2013  . Cancer [facility]) breast ca  right  . COPD (chronic obstructive pulmonary disease) ([place]) 10/01/2013  . Depression   . Diabetes mellitus type 2  . Emphysema   . Encephalopathy, hepatic ([place]) 06/07/2014  . Esophageal reflux 10/01/2013  . Hyperlipidemia   . Hyperlipidemia, mixed   . Increased  ammonia level 11/25/2014  . NASH (nonalcoholic steatohepatitis) 08/26/2015  . Neck pain 10/01/2013  . Neuropathy (HCC)    feet   . Overactive bladder 12/10/2013  . Panic attacks   . Pedal edema 12/10/2013  . Preventative health care 03/08/2016  . Tobacco abuse disorder 02/01/2014    Tobacco Use: History  Smoking Status  . Current Some Day Smoker  . Packs/day: 0.50  . Years: 58.00  . Types: Cigarettes  . Start date: 07/27/1964  Smokeless Tobacco  . Never Used    Comment: 1 pack per week, tobacco infor given 12/30/15    Labs: Recent Review Flowsheet Data    Labs for ITP Cardiac and Pulmonary Rehab Latest Ref Rng & Units 10/15/2014 03/26/2015 07/03/2015 08/26/2015 02/25/2016   Cholestrol 0 - 200 mg/dL - 151 - 157 187   LDLCALC 0 - 99 mg/dL - 93 - 107(H) 108(H)   HDL >39.00 mg/dL - 42.90 - 38.10(L) 55.80   Trlycerides 0.0 - 149.0 mg/dL - 74.0 - 58.0 112.0   Hemoglobin A1c 4.6 - 6.5 % - 6.6(H) 6.5 - 6.3   PHART 7.350 - 7.450 7.426 - - - -   PCO2ART 35.0 - 45.0 mmHg 37.2 - - - -   HCO3 20.0 - 24.0 mEq/L 24.5(H) - - - -   TCO2 0 - 100 mmol/L 26 - - - -   O2SAT % 95.0 - - - -      Capillary Blood Glucose: Lab Results  Component Value Date   GLUCAP 150 (H) 08/01/2015   GLUCAP 240 (H) 08/01/2015   GLUCAP 154 (H) 08/01/2015   GLUCAP 162 (H) 08/01/2015   GLUCAP 185 (H) 08/01/2015       POCT Glucose    Row Name 05/14/16 1612 05/19/16 1547 05/21/16 1633 05/26/16 1608 05/28/16 1650     POCT Blood Glucose   Pre-Exercise 173 mg/dL 192 mg/dL 220 mg/dL 171 mg/dL 192 mg/dL   Post-Exercise 142 mg/dL  - 140 mg/dL 131 mg/dL  -      ADL UCSD:     Pulmonary Assessment Scores    Row Name 05/20/16 0925         ADL UCSD   ADL Phase Entry     SOB Score total 60       CAT Score   CAT Score 17        Pulmonary Function Assessment:     Pulmonary Function Assessment - 05/04/16 1047      Post Bronchodilator Spirometry Results   FVC% 1.85 %   FEV1% 40 %   FEV1/FVC Ratio 54       Exercise Target Goals:    Exercise Program Goal: Individual exercise prescription set with THRR, safety & activity barriers. Participant demonstrates ability to understand and report RPE using BORG scale, to self-measure pulse accurately, and to acknowledge the importance of the exercise prescription.  Exercise Prescription  Goal: Starting with aerobic activity 30 plus minutes a day, 3 days per week for initial exercise prescription. Provide home exercise prescription and guidelines that participant acknowledges understanding prior to discharge.  Activity Barriers & Risk Stratification:     Activity Barriers & Cardiac Risk Stratification - 05/04/16 1027      Activity Barriers & Cardiac Risk Stratification   Activity Barriers Arthritis;Deconditioning;Balance Concerns      6 Minute Walk:     6 Minute Walk    Row Name 05/07/16 1618         6 Minute Walk   Phase Initial     Distance 832 feet     Walk Time 6 minutes     # of Rest Breaks 0     MPH 1.57     METS 2.23     RPE 11     Perceived Dyspnea  1     Symptoms Yes (comment)     Comments knee pain     Resting HR 87 bpm     Resting BP 100/62     Max Ex. HR 94 bpm     Max Ex. BP 130/60       Interval HR   Baseline HR 87     1 Minute HR 88     2 Minute HR 94     3 Minute HR 94     4 Minute HR 90     5 Minute HR 92     6 Minute HR 91     2 Minute Post HR 90     Interval Heart Rate? Yes       Interval Oxygen   Interval Oxygen? Yes     Baseline Oxygen Saturation % 94 %     Baseline Liters of Oxygen 9 L     1 Minute Oxygen Saturation % 94 %     1 Minute Liters of Oxygen 0 L     2 Minute Oxygen Saturation % 92 %     2 Minute Liters of Oxygen 0 L     3 Minute Oxygen Saturation % 92 %     3 Minute Liters of Oxygen 0 L     4 Minute Oxygen Saturation % 94 %     4 Minute Liters of Oxygen 0 L     5 Minute Oxygen Saturation % 95 %     5 Minute Liters of Oxygen 0 L     6 Minute Oxygen Saturation % 96 %     6  Minute Liters of Oxygen 0 L     2 Minute Post Oxygen Saturation % 97 %     2 Minute Post Liters of Oxygen 0 L        Initial Exercise Prescription:     Initial Exercise Prescription - 05/07/16 1600      Date of Initial Exercise RX and Referring Provider   Date 05/07/16   Referring Provider Dr. Elsworth Soho     NuStep   Level 2   Minutes 17   METs 1.5     Arm Ergometer   Level 1   Minutes 17     Track   Laps 5   Minutes 17     Prescription Details   Frequency (times per week) 2   Duration Progress to 45 minutes of aerobic exercise without signs/symptoms of physical distress     Intensity   THRR 40-80% of Max Heartrate 59-118   Ratings of  Perceived Exertion 11-13   Perceived Dyspnea 0-4     Progression   Progression Continue progressive overload as per policy without signs/symptoms or physical distress.     Resistance Training   Training Prescription Yes   Weight green bands   Reps 10-12      Perform Capillary Blood Glucose checks as needed.  Exercise Prescription Changes:     Exercise Prescription Changes    Row Name 05/14/16 1600 05/21/16 1600 05/26/16 1600 05/28/16 1647       Exercise Review   Progression  - Yes  - Yes      Response to Exercise   Blood Pressure (Admit) 98/42 104/60 102/50 110/50    Blood Pressure (Exercise) 136/66 110/64 130/50 124/70    Blood Pressure (Exit) 104/60 114/60 100/40 112/68    Heart Rate (Admit) 70 bpm 73 bpm 71 bpm 76 bpm    Heart Rate (Exercise) 85 bpm 79 bpm 81 bpm 92 bpm    Heart Rate (Exit) 80 bpm 80 bpm 75 bpm 84 bpm    Oxygen Saturation (Admit) 97 % 94 % 94 % 97 %    Oxygen Saturation (Exercise) 95 % 95 % 93 % 96 %    Oxygen Saturation (Exit) 95 % 91 % 91 % 84 %    Rating of Perceived Exertion (Exercise) _0 Perceived Dyspnea (Exercise) _1 Duration Progress to 45 minutes of aerobic exercise without signs/symptoms of physical distress Progress to 45 minutes of aerobic exercise without signs/symptoms  of physical distress Progress to 45 minutes of aerobic exercise without signs/symptoms of physical distress Progress to 45 minutes of aerobic exercise without signs/symptoms of physical distress    Intensity -  40-80% HRR THRR unchanged THRR unchanged THRR unchanged      Progression   Progression  - Continue to progress workloads to maintain intensity without signs/symptoms of physical distress. Continue to progress workloads to maintain intensity without signs/symptoms of physical distress. Continue to progress workloads to maintain intensity without signs/symptoms of physical distress.      Resistance Training   Training Prescription Yes Yes Yes Yes    Weight green bands  10 min green bands green bands green bands    Reps 10-12 10-12  10 minutes of strength training 10-12  10 minutes of strength training 10-12  10 minutes of strength training      Interval Training   Interval Training  - No No No      Oxygen   Oxygen -  room air  -  -  -      NuStep   Level  - 2  - 3    Minutes  - _2 METs  - 1.3 1.7 2.4      Arm Ergometer   Level _3 -    Minutes _4 -      Track   Laps 8  - 8 9    Minutes 17  - 17 17       Exercise Comments:     Exercise Comments    Row Name 06/01/16 1031           Exercise Comments Patient has difficulty with intensity increases. Has only attended 4 exercise sessions. Will cont. to monitor.           Discharge Exercise Prescription (Final Exercise Prescription Changes):     Exercise Prescription  Changes - 05/28/16 1647      Exercise Review   Progression Yes     Response to Exercise   Blood Pressure (Admit) 110/50   Blood Pressure (Exercise) 124/70   Blood Pressure (Exit) 112/68   Heart Rate (Admit) 76 bpm   Heart Rate (Exercise) 92 bpm   Heart Rate (Exit) 84 bpm   Oxygen Saturation (Admit) 97 %   Oxygen Saturation (Exercise) 96 %   Oxygen Saturation (Exit) 84 %   Rating of Perceived Exertion (Exercise) 11    Perceived Dyspnea (Exercise) 2   Duration Progress to 45 minutes of aerobic exercise without signs/symptoms of physical distress   Intensity THRR unchanged     Progression   Progression Continue to progress workloads to maintain intensity without signs/symptoms of physical distress.     Resistance Training   Training Prescription Yes   Weight green bands   Reps 10-12  10 minutes of strength training     Interval Training   Interval Training No     NuStep   Level 3   Minutes 17   METs 2.4     Track   Laps 9   Minutes 17       Nutrition:  Target Goals: Understanding of nutrition guidelines, daily intake of sodium <1531m, cholesterol <2021m calories 30% from fat and 7% or less from saturated fats, daily to have 5 or more servings of fruits and vegetables.  Biometrics:     Pre Biometrics - 05/04/16 1037      Pre Biometrics   Grip Strength 29 kg       Nutrition Therapy Plan and Nutrition Goals:   Nutrition Discharge: Rate Your Plate Scores:   Psychosocial: Target Goals: Acknowledge presence or absence of depression, maximize coping skills, provide positive support system. Participant is able to verbalize types and ability to use techniques and skills needed for reducing stress and depression.  Initial Review & Psychosocial Screening:     Initial Psych Review & Screening - [place]04/16 [place]  Not the best support system, but has one   Strains Intra-family strains;Illness and family care strain  sister in wheel chair, neice is helpful, [place]     Barriers   Psychosocial barriers to participate in program There are no identifiable barriers or psychosocial needs.     Screening Interventions   Interventions Encouraged to exercise      Quality of Life Scores:     Quality of Life - 05/20/16 0925      Quality of Life Scores   Health/Function Pre 14.69 %   Socioeconomic Pre  12.42 %   Psych/Spiritual Pre 16.36 %   Family Pre 0 %   GLOBAL Pre 14.62 %      PHQ-9: Recent Review Flowsheet Data    [facility] 05/04/2016 02/25/2016 02/08/2015 02/08/2015 12/26/2014   Decreased Interest 0 0 - 0 0   Down, Depressed, Hopeless 0 0 1 - 0   PHQ - 2 Score 0 0 1 0 0      Psychosocial Evaluation and Intervention:     Psychosocial Evaluation - 05/04/16 1044      Psychosocial Evaluation & Interventions   Interventions Encouraged to exercise with the program and follow exercise prescription   Continued Psychosocial Services Needed No      Psychosocial Re-Evaluation:     Psychosocial Re-Evaluation  [place] Name 05/26/16 1142 05/26/16 1143           Psychosocial Re-Evaluation   Interventions Encouraged to attend Pulmonary Rehabilitation for the exercise  -      Comments  - No psychosocial issues identified at this time      Continued Psychosocial Services Needed  - No        Education: Education Goals: Education classes will be provided on a weekly basis, covering required topics. Participant will state understanding/return demonstration of topics presented.  Learning Barriers/Preferences:     Learning Barriers/Preferences - 05/04/16 1031      Learning Barriers/Preferences   Learning Barriers None   Learning Preferences Written Material;Video;Verbal Instruction;Skilled Demonstration;Pictoral;Individual Instruction;Group Instruction;Computer/Internet;Audio      Education Topics: Risk Factor Reduction:  -Group instruction that is supported by a PowerPoint presentation. Instructor discusses the definition of a risk factor, different risk factors for pulmonary disease, and how the heart and lungs work together.     Nutrition for Pulmonary Patient:  -Group instruction provided by PowerPoint slides, verbal discussion, and written materials to support subject matter. The instructor gives an explanation and review of healthy diet recommendations,  which includes a discussion on weight management, recommendations for fruit and vegetable consumption, as well as protein, fluid, caffeine, fiber, sodium, sugar, and alcohol. Tips for eating when patients are short of breath are discussed.   Pursed Lip Breathing:  -Group instruction that is supported by demonstration and informational handouts. Instructor discusses the benefits of pursed lip and diaphragmatic breathing and detailed demonstration on how to preform both.     Oxygen Safety:  -Group instruction provided by PowerPoint, verbal discussion, and written material to support subject matter. There is an overview of "What is Oxygen" and "Why do we need it".  Instructor also reviews how to create a safe environment for oxygen use, the importance of using oxygen as prescribed, and the risks of noncompliance. There is a brief discussion on traveling with oxygen and resources the patient may utilize. Flowsheet Row PULMONARY REHAB CHRONIC OBSTRUCTIVE PULMONARY DISEASE from 05/28/2016 in [place]  Date  05/28/16  Educator  rn  Instruction Review Code  2- meets goals/outcomes      Oxygen Equipment:  -Group instruction provided by [employer] Staff utilizing handouts, written materials, and equipment demonstrations.   Signs and Symptoms:  -Group instruction provided by written material and verbal discussion to support subject matter. Warning signs and symptoms of infection, stroke, and heart attack are reviewed and when to call the physician/911 reinforced. Tips for preventing the spread of infection discussed.   Advanced Directives:  -Group instruction provided by verbal instruction and written material to support subject matter. Instructor reviews Advanced Directive laws and proper instruction for filling out document.   Pulmonary Video:  -Group video education that reviews the importance of medication and oxygen compliance, exercise, good nutrition,  pulmonary hygiene, and pursed lip and diaphragmatic breathing for the pulmonary patient. Flowsheet Row PULMONARY REHAB CHRONIC OBSTRUCTIVE PULMONARY DISEASE from 05/28/2016 in [place]  Date  05/14/16  Educator  video  Instruction Review Code  2- meets goals/outcomes      Exercise for the Pulmonary Patient:  -Group instruction that is supported by a PowerPoint presentation. Instructor discusses benefits of exercise, core components of exercise, frequency, duration, and intensity of an exercise routine, importance of utilizing pulse oximetry during exercise, safety while exercising, and options of places to exercise outside of rehab.  Pulmonary Medications:  -Verbally interactive group education provided by instructor with focus on inhaled medications and proper administration.   Anatomy and Physiology of the Respiratory System and Intimacy:  -Group instruction provided by PowerPoint, verbal discussion, and written material to support subject matter. Instructor reviews respiratory cycle and anatomical components of the respiratory system and their functions. Instructor also reviews differences in obstructive and restrictive respiratory diseases with examples of each. Intimacy, Sex, and Sexuality differences are reviewed with a discussion on how relationships can change when diagnosed with pulmonary disease. Common sexual concerns are reviewed.   Knowledge Questionnaire Score:     Knowledge Questionnaire Score - 05/20/16 0924      Knowledge Questionnaire Score   Pre Score 8/13      Core Components/Risk Factors/Patient Goals at Admission:     Personal Goals and Risk Factors at Admission - 05/04/16 1038      Core Components/Risk Factors/Patient Goals on Admission   Increase Strength and Stamina Yes   Intervention Provide advice, education, support and counseling about physical activity/exercise needs.;Develop an individualized exercise prescription  for aerobic and resistive training based on initial evaluation findings, risk stratification, comorbidities and participant's personal goals.   Expected Outcomes Achievement of increased cardiorespiratory fitness and enhanced flexibility, muscular endurance and strength shown through measurements of functional capacity and personal statement of participant.   Tobacco Cessation Yes   Number of packs per day 0-2 cigarettes/day   Intervention Assist the participant in steps to quit. Provide individualized education and counseling about committing to Tobacco Cessation, relapse prevention, and pharmacological support that can be provided by physician.;Advice worker, assist with locating and accessing local/national Quit Smoking programs, and support quit date choice.   Expected Outcomes Short Term: Will quit all tobacco product use, adhering to prevention of relapse plan.   Improve shortness of breath with ADL's Yes   Intervention Provide education, individualized exercise plan and daily activity instruction to help decrease symptoms of SOB with activities of daily living.   Expected Outcomes Short Term: Achieves a reduction of symptoms when performing activities of daily living.   Develop more efficient breathing techniques such as purse lipped breathing and diaphragmatic breathing; and practicing self-pacing with activity Yes   Intervention Provide education, demonstration and support about specific breathing techniuqes utilized for more efficient breathing. Include techniques such as pursed lipped breathing, diaphragmatic breathing and self-pacing activity.   Expected Outcomes Short Term: Participant will be able to demonstrate and use breathing techniques as needed throughout daily activities.   Increase knowledge of respiratory medications and ability to use respiratory devices properly  Yes      Core Components/Risk Factors/Patient Goals Review:      Goals and Risk Factor Review     Row Name 05/26/16 1139             Core Components/Risk Factors/Patient Goals Review   Personal Goals Review Tobacco Cessation;Increase knowledge of respiratory medications and ability to use respiratory devices properly.;Improve shortness of breath with ADL's;Develop more efficient breathing techniques such as purse lipped breathing and diaphragmatic breathing and practicing self-pacing with activity.;Increase Strength and Stamina       Review Has attended 3 exercise sessions, too early to see any progress towards goals.  Is not ready to quit smoking at this time, support given.       Expected Outcomes Expect to see an increase in strength and stamina in the next 30 days          Core Components/Risk Factors/Patient Goals  at Discharge (Final Review):      Goals and Risk Factor Review - 05/26/16 1139      Core Components/Risk Factors/Patient Goals Review   Personal Goals Review Tobacco Cessation;Increase knowledge of respiratory medications and ability to use respiratory devices properly.;Improve shortness of breath with ADL's;Develop more efficient breathing techniques such as purse lipped breathing and diaphragmatic breathing and practicing self-pacing with activity.;Increase Strength and Stamina   Review Has attended 3 exercise sessions, too early to see any progress towards goals.  Is not ready to quit smoking at this time, support given.   Expected Outcomes Expect to see an increase in strength and stamina in the next 30 days      ITP Comments:   Comments: ITP REVIEW Pt is making expected progress toward pulmonary rehab goals after completing 4 sessions. Recommend continued exercise, life style modification, education, and utilization of breathing techniques to increase stamina and strength and decrease shortness of breath with exertion.

## 2016-06-03 ENCOUNTER — Encounter: Payer: Self-pay | Admitting: Gastroenterology

## 2016-06-04 ENCOUNTER — Encounter (HOSPITAL_COMMUNITY)
Admission: RE | Admit: 2016-06-04 | Discharge: 2016-06-04 | Disposition: A | Discharge status: Home / Self Care | Payer: PPO | Source: Ambulatory Visit | Attending: Pulmonary Disease | Admitting: Pulmonary Disease

## 2016-06-04 ENCOUNTER — Encounter: Payer: Self-pay | Admitting: Family Medicine

## 2016-06-04 VITALS — Wt 155.4 lb

## 2016-06-04 DIAGNOSIS — J42 Unspecified chronic bronchitis: Secondary | ICD-10-CM

## 2016-06-04 DIAGNOSIS — J449 Chronic obstructive pulmonary disease, unspecified: Secondary | ICD-10-CM | POA: Diagnosis not present

## 2016-06-04 NOTE — Progress Notes (Signed)
Daily Session Note  Patient Details  Name: Ashley Savage MRN: [id number] Date of Birth: [birth year] Referring Provider:   [name] Pulmonary Rehab Walk Test from 05/07/2016 in [place]  Referring Provider  Dr. Elsworth Soho      Encounter Date: 06/04/2016  Check In:     Session Check In - 06/04/16 1330      Check-In   Location [facility]   Staff Present Rosebud Poles, RN, BSN;Lisa Ysidro Evert, Felipe Drone, RN, MHA;Muranda Coye Rollene Rotunda, RN, BSN   Supervising physician immediately available to respond to emergencies [facility] immediately available   Physician(s) Dr. Tana Coast   Medication changes reported     No   Fall or balance concerns reported    No   Warm-up and Cool-down Performed as group-led instruction   Resistance Training Performed Yes   VAD Patient? No     Pain Assessment   Currently in Pain? No/denies   Multiple Pain Sites No      Capillary Blood Glucose: No results found for this or any previous visit (from the past 24 hour(s)).     POCT Glucose - 06/04/16 1616      POCT Blood Glucose   Pre-Exercise 179 mg/dL   Post-Exercise 135 mg/dL         Exercise Prescription Changes - 06/04/16 1613      Response to Exercise   Blood Pressure (Admit) 110/52   Blood Pressure (Exercise) 110/62   Blood Pressure (Exit) 104/60   Heart Rate (Admit) 76 bpm   Heart Rate (Exercise) 94 bpm   Heart Rate (Exit) 84 bpm   Oxygen Saturation (Admit) 94 %   Oxygen Saturation (Exercise) 89 %   Oxygen Saturation (Exit) 95 %   Rating of Perceived Exertion (Exercise) 13   Perceived Dyspnea (Exercise) 1   Duration Progress to 45 minutes of aerobic exercise without signs/symptoms of physical distress   Intensity THRR unchanged     Progression   Progression Continue to progress workloads to maintain intensity without signs/symptoms of physical distress.     Resistance Training   Training Prescription Yes   Weight green bands   Reps 10-12  10 minutes of strength training     Interval Training   Interval Training No     NuStep   Level 3   Minutes 17     Arm Ergometer   Level 1   Minutes 17     Goals Met:  Improved SOB with ADL's Using PLB without cueing & demonstrates good technique Exercise tolerated well No report of cardiac concerns or symptoms Strength training completed today  Goals Unmet:  Not Applicable  Comments: Service time is from 1330 to 1545   Dr. Rush Farmer is Medical Director for Pulmonary Rehab at [facility].

## 2016-06-04 NOTE — Progress Notes (Signed)
Nutrition Note Spoke with pt after nutrition class. Pt asked if she could use her Novolog in place of her Levemir because she "ran out of Levemir and can't afford to refill my prescription." Per discussion, pt is in the "donut hole." Pt advised to contact her PCP and Pharmacist re: financial barrier to medication compliance. Delora Fuel, RN notified re: above. Pt expressed understanding of the information provided. Continue client-centered nutrition education by RD as part of interdisciplinary care.  Monitor and evaluate progress toward nutrition goal with team.  Derek Mound, M.Ed, RD, LDN, CDE 06/04/2016 2:58 PM

## 2016-06-05 ENCOUNTER — Telehealth: Payer: Self-pay | Admitting: Family Medicine

## 2016-06-05 NOTE — Telephone Encounter (Signed)
Called left message to call back 

## 2016-06-05 NOTE — Telephone Encounter (Signed)
-----   Message from Mosie Lukes, MD sent at 06/04/2016 10:25 PM EST ----- Regarding: RE: Unable to afford Levemir Please check with patient and see what she is currently doing. She should have Novolog already. She could certainly try Novolog 10 units tid with meals and monitor suar closely. Then we can adjust from there.  ----- Message ----- From: Jewel Baize, RD Sent: 06/04/2016   3:00 PM To: Mosie Lukes, MD Subject: Unable to afford Levemir                       Pt reports she is unable to afford Levemir due to being in the donut hole. Pt wants to use Novolog. Pt advised not to use Novolog in place of Levemir. Novolog vs. Levemir's role in managing DM discussed. Please see 06/04/16 RD note for details.  Please advise prn. Thank you, Derek Mound, M.Ed, RD, LDN, CDE

## 2016-06-07 ENCOUNTER — Encounter: Payer: Self-pay | Admitting: Pulmonary Disease

## 2016-06-08 NOTE — Telephone Encounter (Signed)
E-mail sent to patient - whom did she leave them with?  What day did she take them?  We typically only go to [facility] on Thursday

## 2016-06-09 ENCOUNTER — Encounter (HOSPITAL_COMMUNITY)
Admission: RE | Admit: 2016-06-09 | Discharge: 2016-06-09 | Disposition: A | Discharge status: Home / Self Care | Payer: PPO | Source: Ambulatory Visit | Attending: Pulmonary Disease | Admitting: Pulmonary Disease

## 2016-06-09 VITALS — BP 120/50 | HR 79 | Wt 157.4 lb

## 2016-06-09 DIAGNOSIS — J449 Chronic obstructive pulmonary disease, unspecified: Secondary | ICD-10-CM | POA: Diagnosis not present

## 2016-06-09 DIAGNOSIS — J42 Unspecified chronic bronchitis: Secondary | ICD-10-CM

## 2016-06-09 NOTE — Progress Notes (Signed)
Daily Session Note  Patient Details  Name: Ashley Savage MRN: [id number] Date of Birth: January 20, 1941 Referring Provider:   [name] Pulmonary Rehab Walk Test from 05/07/2016 in [place]  Referring Provider  Dr. Elsworth Soho      Encounter Date: 06/09/2016  Check In:     Session Check In - 06/09/16 1339      Check-In   Location [facility]   Staff Present Rosebud Poles, RN, BSN;Lisa Ysidro Evert, RN;Molly diVincenzo, MS, ACSM RCEP, Exercise Physiologist;Dijon Cosens Celesta Aver, MS, ACSM CEP, Exercise Physiologist   Supervising physician immediately available to respond to emergencies [facility] immediately available   Physician(s) Dr. Tana Coast   Medication changes reported     No   Fall or balance concerns reported    No   Warm-up and Cool-down Performed as group-led instruction   Resistance Training Performed Yes   VAD Patient? No     Pain Assessment   Currently in Pain? No/denies   Multiple Pain Sites No      Capillary Blood Glucose: No results found for this or any previous visit (from the past 24 hour(s)).      Exercise Prescription Changes - 06/09/16 1500      Response to Exercise   Blood Pressure (Admit) 120/50   Blood Pressure (Exercise) 104/60   Blood Pressure (Exit) 120/60   Heart Rate (Admit) 79 bpm   Heart Rate (Exercise) 99 bpm   Heart Rate (Exit) 55 bpm   Oxygen Saturation (Admit) 95 %   Oxygen Saturation (Exercise) 94 %   Oxygen Saturation (Exit) 91 %   Rating of Perceived Exertion (Exercise) 13   Perceived Dyspnea (Exercise) 3   Duration Progress to 45 minutes of aerobic exercise without signs/symptoms of physical distress   Intensity THRR unchanged     Progression   Progression Continue to progress workloads to maintain intensity without signs/symptoms of physical distress.     Resistance Training   Training Prescription Yes   Weight green bands   Reps 10-12  10 minutes of strength training     Interval  Training   Interval Training No     NuStep   Level 3   Minutes 17   METs 1.5     Arm Ergometer   Level 1   Minutes 17     Track   Laps 7   Minutes 17     Goals Met:  Exercise tolerated well No report of cardiac concerns or symptoms Strength training completed today  Goals Unmet:  Not Applicable  Comments: Service time is from 1330 to 1500    Dr. Rush Farmer is Medical Director for Pulmonary Rehab at [facility].

## 2016-06-10 ENCOUNTER — Other Ambulatory Visit: Payer: Self-pay

## 2016-06-10 MED ORDER — TIOTROPIUM BROMIDE MONOHYDRATE 1.25 MCG/ACT IN AERS: 2.0000 | INHALATION_SPRAY | Freq: Every day | RESPIRATORY_TRACT | 0 refills | Status: DC

## 2016-06-11 ENCOUNTER — Encounter (HOSPITAL_COMMUNITY)
Admission: RE | Admit: 2016-06-11 | Discharge: 2016-06-11 | Disposition: A | Discharge status: Home / Self Care | Payer: PPO | Source: Ambulatory Visit | Attending: Pulmonary Disease | Admitting: Pulmonary Disease

## 2016-06-11 VITALS — Wt 157.8 lb

## 2016-06-11 DIAGNOSIS — J449 Chronic obstructive pulmonary disease, unspecified: Secondary | ICD-10-CM | POA: Diagnosis not present

## 2016-06-11 DIAGNOSIS — J42 Unspecified chronic bronchitis: Secondary | ICD-10-CM

## 2016-06-11 NOTE — Progress Notes (Signed)
Daily Session Note  Patient Details  Name: Ashley Savage MRN: [id number] Date of Birth: 05-07-1941 Referring Provider:   [name] Pulmonary Rehab Walk Test from 05/07/2016 in [place]  Referring Provider  Dr. Elsworth Soho      Encounter Date: 06/11/2016  Check In:     Session Check In - 06/11/16 1330      Check-In   Location [facility]   Staff Present Rosebud Poles, RN, BSN;Molly diVincenzo, MS, ACSM RCEP, Exercise Physiologist;Lisa Ysidro Evert, RN;Dannilynn Gallina Rollene Rotunda, RN, BSN   Supervising physician immediately available to respond to emergencies [facility] immediately available   Physician(s) Dr. Grandville Silos   Medication changes reported     No   Fall or balance concerns reported    No   Warm-up and Cool-down Performed as group-led instruction   Resistance Training Performed Yes   VAD Patient? No     Pain Assessment   Currently in Pain? No/denies   Multiple Pain Sites No      Capillary Blood Glucose: No results found for this or any previous visit (from the past 24 hour(s)).     POCT Glucose - 06/11/16 1610      POCT Blood Glucose   Pre-Exercise 120 mg/dL   Post-Exercise 167 mg/dL         Exercise Prescription Changes - 06/11/16 1608      Response to Exercise   Blood Pressure (Admit) 102/50   Blood Pressure (Exercise) 120/52   Blood Pressure (Exit) 118/80   Heart Rate (Admit) 85 bpm   Heart Rate (Exercise) 98 bpm   Heart Rate (Exit) 82 bpm   Oxygen Saturation (Admit) 91 %   Oxygen Saturation (Exercise) 91 %   Oxygen Saturation (Exit) 82 %   Rating of Perceived Exertion (Exercise) 11   Perceived Dyspnea (Exercise) 1   Duration Progress to 45 minutes of aerobic exercise without signs/symptoms of physical distress   Intensity THRR unchanged     Progression   Progression Continue to progress workloads to maintain intensity without signs/symptoms of physical distress.     Resistance Training   Training  Prescription Yes   Weight green bands   Reps 10-12  10 minutes of strength training     Interval Training   Interval Training No     NuStep   Level 3   Minutes 17   METs 1.5     Arm Ergometer   Level --   Minutes --     Track   Laps 8   Minutes 17     Goals Met:  Exercise tolerated well Queuing for purse lip breathing No report of cardiac concerns or symptoms Strength training completed today  Goals Unmet:  Not Applicable  Comments: Service time is from 1330 to 1530   Dr. Rush Farmer is Medical Director for Pulmonary Rehab at [facility].

## 2016-06-16 ENCOUNTER — Encounter (HOSPITAL_COMMUNITY)
Admission: RE | Admit: 2016-06-16 | Discharge: 2016-06-16 | Disposition: A | Discharge status: Home / Self Care | Payer: PPO | Source: Ambulatory Visit | Attending: Pulmonary Disease | Admitting: Pulmonary Disease

## 2016-06-16 VITALS — Wt 162.0 lb

## 2016-06-16 DIAGNOSIS — J449 Chronic obstructive pulmonary disease, unspecified: Secondary | ICD-10-CM | POA: Diagnosis not present

## 2016-06-16 DIAGNOSIS — J42 Unspecified chronic bronchitis: Secondary | ICD-10-CM

## 2016-06-16 NOTE — Progress Notes (Signed)
Daily Session Note  Patient Details  Name: Ashley Savage MRN: [id number] Date of Birth: Jul 29, 1940 Referring Provider:   [name] Pulmonary Rehab Walk Test from 05/07/2016 in [place]  Referring Provider  Dr. Elsworth Soho      Encounter Date: 06/16/2016  Check In:     Session Check In - 06/16/16 1330      Check-In   Location [facility]   Staff Present Rosebud Poles, RN, BSN;Molly diVincenzo, MS, ACSM RCEP, Exercise Physiologist;Lisa Ysidro Evert, RN;Shoua Ressler Rollene Rotunda, RN, BSN   Supervising physician immediately available to respond to emergencies [facility] immediately available   Physician(s) Dr. Grandville Silos   Medication changes reported     No   Fall or balance concerns reported    No   Warm-up and Cool-down Performed as group-led instruction   Resistance Training Performed Yes   VAD Patient? No     Pain Assessment   Currently in Pain? No/denies   Multiple Pain Sites No      Capillary Blood Glucose: No results found for this or any previous visit (from the past 24 hour(s)).     POCT Glucose - 06/16/16 1515      POCT Blood Glucose   Pre-Exercise 208 mg/dL   Post-Exercise 115 mg/dL         Exercise Prescription Changes - 06/16/16 1513      Response to Exercise   Blood Pressure (Admit) 121/67   Blood Pressure (Exercise) 114/70   Blood Pressure (Exit) 100/62   Heart Rate (Admit) 74 bpm   Heart Rate (Exercise) 94 bpm   Heart Rate (Exit) 77 bpm   Oxygen Saturation (Admit) 98 %   Oxygen Saturation (Exercise) 93 %   Oxygen Saturation (Exit) 95 %   Rating of Perceived Exertion (Exercise) 15   Perceived Dyspnea (Exercise) 1   Duration Progress to 45 minutes of aerobic exercise without signs/symptoms of physical distress   Intensity THRR unchanged     Progression   Progression Continue to progress workloads to maintain intensity without signs/symptoms of physical distress.     Resistance Training   Training  Prescription Yes   Weight green bands   Reps 10-12  10 minutes of strength training     Interval Training   Interval Training No     NuStep   Level 3   Minutes 17   METs 1.5     Arm Ergometer   Level 1   Minutes 17     Track   Laps 10   Minutes 17     Goals Met:  Improved SOB with ADL's Using PLB without cueing & demonstrates good technique Exercise tolerated well Strength training completed today  Goals Unmet:  Not Applicable  Comments: Service time is from 1330 to 1500   Dr. Rush Farmer is Medical Director for Pulmonary Rehab at [facility].

## 2016-06-18 ENCOUNTER — Encounter (HOSPITAL_COMMUNITY): Payer: PPO

## 2016-06-23 ENCOUNTER — Encounter (HOSPITAL_COMMUNITY)
Admission: RE | Admit: 2016-06-23 | Discharge: 2016-06-23 | Disposition: A | Discharge status: Home / Self Care | Payer: PPO | Source: Ambulatory Visit | Attending: Pulmonary Disease | Admitting: Pulmonary Disease

## 2016-06-23 VITALS — Wt 161.4 lb

## 2016-06-23 DIAGNOSIS — J449 Chronic obstructive pulmonary disease, unspecified: Secondary | ICD-10-CM | POA: Diagnosis not present

## 2016-06-23 DIAGNOSIS — J42 Unspecified chronic bronchitis: Secondary | ICD-10-CM

## 2016-06-23 NOTE — Progress Notes (Signed)
Daily Session Note  Patient Details  Name: Ashley Savage MRN: [id number] Date of Birth: 27-Apr-1941 Referring Provider:   [name] Pulmonary Rehab Walk Test from 05/07/2016 in [place]  Referring Provider  Dr. Elsworth Soho      Encounter Date: 06/23/2016  Check In:     Session Check In - 06/23/16 1605      Check-In   Location [facility]   Staff Present Su Hilt, MS, ACSM RCEP, Exercise Physiologist;Portia Rollene Rotunda, RN, Maxcine Ham, RN, BSN   Supervising physician immediately available to respond to emergencies [facility] immediately available   Physician(s) Dr. Cathlean Sauer   Medication changes reported     No   Fall or balance concerns reported    No   Warm-up and Cool-down Performed as group-led instruction   Resistance Training Performed Yes   VAD Patient? No     Pain Assessment   Currently in Pain? No/denies   Multiple Pain Sites No      Capillary Blood Glucose: No results found for this or any previous visit (from the past 24 hour(s)).     POCT Glucose - 06/23/16 1608      POCT Blood Glucose   Pre-Exercise 178 mg/dL   Post-Exercise 127 mg/dL         Exercise Prescription Changes - 06/23/16 1600      Response to Exercise   Blood Pressure (Admit) 115/63   Blood Pressure (Exercise) 98/42   Blood Pressure (Exit) 96/50   Heart Rate (Admit) 83 bpm   Heart Rate (Exercise) 76 bpm   Heart Rate (Exit) 73 bpm   Oxygen Saturation (Admit) 96 %   Oxygen Saturation (Exercise) 94 %   Oxygen Saturation (Exit) 95 %   Rating of Perceived Exertion (Exercise) 11   Perceived Dyspnea (Exercise) 1   Duration Progress to 45 minutes of aerobic exercise without signs/symptoms of physical distress   Intensity THRR unchanged     Progression   Progression Continue to progress workloads to maintain intensity without signs/symptoms of physical distress.     Resistance Training   Training Prescription Yes   Weight  green bands   Reps 10-12  10 minutes of strength training     Interval Training   Interval Training No     NuStep   Level 3   Minutes 17   METs --     Arm Ergometer   Level 1   Minutes 17     Track   Laps 9   Minutes 17     Goals Met:  Exercise tolerated well No report of cardiac concerns or symptoms Strength training completed today  Goals Unmet:  Not Applicable  Comments: Service time is from 1:30PM to 3:15PM    Dr. Rush Farmer is Medical Director for Pulmonary Rehab at [facility].

## 2016-06-25 ENCOUNTER — Encounter: Payer: Self-pay | Admitting: Hematology & Oncology

## 2016-06-25 ENCOUNTER — Encounter (HOSPITAL_COMMUNITY)
Admission: RE | Admit: 2016-06-25 | Discharge: 2016-06-25 | Disposition: A | Discharge status: Home / Self Care | Payer: PPO | Source: Ambulatory Visit | Attending: Pulmonary Disease | Admitting: Pulmonary Disease

## 2016-06-25 DIAGNOSIS — J42 Unspecified chronic bronchitis: Secondary | ICD-10-CM

## 2016-06-25 NOTE — Progress Notes (Signed)
Incomplete Session Note  Patient Details  Name: Ashley Savage MRN: [id number] Date of Birth: 12-28-1940 Referring Provider:   [name] Pulmonary Rehab Walk Test from 05/07/2016 in [place]  Referring Provider  Dr. Queen Slough did not complete her rehab session.  She thinks she pulled a muscle in her chest that is uncomfortable when she moves.  She stayed for the education session, but did not exercise.

## 2016-06-26 ENCOUNTER — Telehealth: Payer: Self-pay | Admitting: Gastroenterology

## 2016-06-28 ENCOUNTER — Other Ambulatory Visit: Payer: Self-pay | Admitting: Gastroenterology

## 2016-06-29 ENCOUNTER — Ambulatory Visit (HOSPITAL_BASED_OUTPATIENT_CLINIC_OR_DEPARTMENT_OTHER): Payer: PPO | Admitting: Family

## 2016-06-29 ENCOUNTER — Other Ambulatory Visit (HOSPITAL_BASED_OUTPATIENT_CLINIC_OR_DEPARTMENT_OTHER): Payer: PPO

## 2016-06-29 ENCOUNTER — Other Ambulatory Visit: Payer: Self-pay

## 2016-06-29 VITALS — BP 132/50 | HR 79 | Temp 97.5°F | Wt 159.1 lb

## 2016-06-29 DIAGNOSIS — Z853 Personal history of malignant neoplasm of breast: Secondary | ICD-10-CM

## 2016-06-29 DIAGNOSIS — D696 Thrombocytopenia, unspecified: Secondary | ICD-10-CM

## 2016-06-29 DIAGNOSIS — K7290 Hepatic failure, unspecified without coma: Secondary | ICD-10-CM

## 2016-06-29 DIAGNOSIS — Z170 Estrogen receptor positive status [ER+]: Secondary | ICD-10-CM | POA: Diagnosis not present

## 2016-06-29 DIAGNOSIS — I8500 Esophageal varices without bleeding: Secondary | ICD-10-CM

## 2016-06-29 DIAGNOSIS — C50019 Malignant neoplasm of nipple and areola, unspecified female breast: Secondary | ICD-10-CM

## 2016-06-29 DIAGNOSIS — C50911 Malignant neoplasm of unspecified site of right female breast: Secondary | ICD-10-CM

## 2016-06-29 DIAGNOSIS — K7682 Hepatic encephalopathy: Secondary | ICD-10-CM

## 2016-06-29 LAB — COMPREHENSIVE METABOLIC PANEL
ALBUMIN: 2.7 g/dL — AB (ref 3.5–5.0)
ALK PHOS: 129 U/L (ref 40–150)
ALT: 24 U/L (ref 0–55)
ANION GAP: 10 meq/L (ref 3–11)
AST: 36 U/L — ABNORMAL HIGH (ref 5–34)
BUN: 15.9 mg/dL (ref 7.0–26.0)
CALCIUM: 9 mg/dL (ref 8.4–10.4)
CO2: 24 mEq/L (ref 22–29)
Chloride: 104 mEq/L (ref 98–109)
Creatinine: 1.1 mg/dL (ref 0.6–1.1)
EGFR: 50 mL/min/{1.73_m2} — AB (ref >90)
Glucose: 160 mg/dl — ABNORMAL HIGH (ref 70–140)
POTASSIUM: 5 meq/L (ref 3.5–5.1)
Sodium: 139 mEq/L (ref 136–145)
Total Bilirubin: 1.78 mg/dL — ABNORMAL HIGH (ref 0.20–1.20)
Total Protein: 5.9 g/dL — ABNORMAL LOW (ref 6.4–8.3)

## 2016-06-29 LAB — CBC WITH DIFFERENTIAL (CANCER CENTER ONLY)
BASO#: 0 10*3/uL (ref 0.0–0.2)
BASO%: 0.3 % (ref 0.0–2.0)
EOS%: 2.5 % (ref 0.0–7.0)
Eosinophils Absolute: 0.2 10*3/uL (ref 0.0–0.5)
HEMATOCRIT: 39.8 % (ref 34.8–46.6)
HGB: 13.9 g/dL (ref 11.6–15.9)
LYMPH#: 1.8 10*3/uL (ref 0.9–3.3)
LYMPH%: 26 % (ref 14.0–48.0)
MCH: 33.5 pg (ref 26.0–34.0)
MCHC: 34.9 g/dL (ref 32.0–36.0)
MCV: 96 fL (ref 81–101)
MONO#: 1.1 10*3/uL — AB (ref 0.1–0.9)
MONO%: 15.5 % — ABNORMAL HIGH (ref 0.0–13.0)
NEUT#: 3.9 10*3/uL (ref 1.5–6.5)
NEUT%: 55.7 % (ref 39.6–80.0)
Platelets: 101 10*3/uL — ABNORMAL LOW (ref 145–400)
RBC: 4.15 10*6/uL (ref 3.70–5.32)
RDW: 14.3 % (ref 11.1–15.7)
WBC: 6.9 10*3/uL (ref 3.9–10.0)

## 2016-06-29 LAB — LACTATE DEHYDROGENASE: LDH: 233 U/L (ref 125–245)

## 2016-06-29 NOTE — Progress Notes (Signed)
Hematology and Oncology Follow Up Visit  Ashley Savage [id number] 1941-07-04 75 y.o. 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 is here today with c/o "heaviness" of the left breast and also pain around the nipple. She does have erythema and thickening of the left breast around the top portion and areola on exam. There is no change with the right chest s/p mastectomy. No lymphadenopathy found on exam.  Her last mammogram was in February and showed possible calcifications in the left breast.  No fever, chills, n/v, dizziness, headache, vision changes, chest pain, palpitations, abdominal pain or changes in bowel or bladder habits.  She is on both lasix (COPD and pedal edema) and lactulose (HE) so she spends a good deal of time in the bathroom.  She has cirrhosis and history of hepatic encephalopathy. Ammonia level is pending.  She has a chronic dry cough and SOB with exertion. She is currently in pulmonary rehab. She is still smoking.  She has some discomfort of the right shoulder due to having to the exercise bike in rehab.  She has some mild "puffiness" in both legs, pedal pulses +1. No weakness, tenderness, numbness or tingling in her extremities.  She has maintained a good appetite but does admit that she needs to drink more water. Her weight is   Medications:    Medication List       Accurate as of 06/29/16  3:53 PM. Always use your most recent med list.          albuterol 1.25 MG/3ML nebulizer solution Commonly known as:  ACCUNEB Take 1 ampule by nebulization as needed for wheezing.   albuterol 108 (90 Base) MCG/ACT inhaler Commonly known as:  PROVENTIL HFA;VENTOLIN HFA Inhale 2 puffs into the lungs every 6 (six) hours as needed for wheezing or shortness of  breath. Only dispense Ventolin   BD PEN NEEDLE NANO U/F 32G X 4 MM Misc Generic drug:  Insulin Pen Needle USE AS DIRECTED WITH LEVEMIR FLEXPEN   cetirizine 10 MG tablet Commonly known as:  ZYRTEC Take 10 mg by mouth daily as needed.   doxycycline 100 MG tablet Commonly known as:  VIBRA-TABS Take 1 tablet (100 mg total) by mouth daily.   furosemide 20 MG tablet Commonly known as:  LASIX TAKE 1 TABLET BY MOUTH TWICE DAILY   guaiFENesin 600 MG 12 hr tablet Commonly known as:  MUCINEX Take 1 tablet (600 mg total) by mouth 2 (two) times daily.   insulin aspart 100 UNIT/ML injection Commonly known as:  NOVOLOG 4 units SQ q lunch daily and prn Sliding scale:  BS <200 no units BS 201-250 use 2 units BS 251-300 use 4 units BS 301-350 use 6 units BS 351-400 use 8 units BS 401-450 use 10 units BS>451 call MD   Insulin Detemir 100 UNIT/ML Pen Commonly known as:  LEVEMIR FLEXPEN Inject 14 Units into the skin daily at 10 pm.   Insulin Syringe-Needle U-100 31G X 5/16" 1 ML Misc To use w/ Novolog   lactulose 10 GM/15ML solution Commonly known as:  CHRONULAC TAKE 45 MLS BY MOUTH TWICE DAILY AS NEEDED FOR MILD CONSTIPATION.   NONFORMULARY OR COMPOUNDED [place] compound:  Authorized Substitiute Pain Cream - Ibuprofen 15%, Baclofen 1%, Gabapentin 3%, Lidocaine 2%, dispense 120 grams,  apply 1-2 grams to affected area 3-4 times daily, +3Refills.   omeprazole 20 MG capsule Commonly known as:  PRILOSEC TAKE 1 CAPSULE(20 MG) BY MOUTH DAILY   ONE TOUCH ULTRA TEST test strip Generic drug:  glucose blood USE TWICE DAILY TO CHECK BLOOD SUGAR AS DIRECTED   ranitidine 150 MG tablet Commonly known as:  ZANTAC Take 1 tablet (150 mg total) by mouth 2 (two) times daily.   sodium chloride 0.65 % Soln nasal spray Commonly known as:  OCEAN Place 1 spray into both nostrils as needed for congestion.   spironolactone 50 MG tablet Commonly known as:  ALDACTONE TAKE 2 TABLETS BY MOUTH  DAILY FOR A TOTAL OF 100 MG   SPIRIVA RESPIMAT 2.5 MCG/ACT Aers Generic drug:  Tiotropium Bromide Monohydrate Inhale 2.5 mcg into the lungs daily as needed.   Tiotropium Bromide Monohydrate 1.25 MCG/ACT Aers Commonly known as:  SPIRIVA RESPIMAT Inhale 2 puffs into the lungs daily.   Vitamin D3 2000 units Tabs Take 2,000 Units by mouth every morning.       Allergies:  Allergies  Allergen Reactions  . Citalopram     Irregular heart beat  . Erythromycin     Stomach cramps  . Glimepiride     Elevated ammonia levels  . Prednisone     Increased blood sugars too high  . Versed [Midazolam] Other (See Comments)    Patient stayed confusion stayed 4+days     Past Medical History, Surgical history, Social history, and Family History were reviewed and updated.  Review of Systems: All other 10 point review of systems is negative.   Physical Exam:  weight is 159 lb 1.9 oz (72.2 kg). Her oral temperature is 97.5 F (36.4 C). Her blood pressure is 132/50 (abnormal) and her pulse is 79.   Wt Readings from Last 3 Encounters:  06/29/16 159 lb 1.9 oz (72.2 kg)  06/23/16 161 lb 6 oz (73.2 kg)  06/16/16 162 lb 0.6 oz (73.5 kg)    Ocular: Sclerae unicteric, pupils equal, round and reactive to light Ear-nose-throat: Oropharynx clear, dentition fair Lymphatic: No cervical supraclavicular or axillary adenopathy Lungs clear throughout, no rales or rhonchi, good excursion bilaterally Heart regular rate and rhythm, no murmur appreciated Abd soft, nontender, positive bowel sounds, no liver or spleen tip palpated on exam, no fluid wave MSK no focal spinal tenderness, no joint edema Neuro: non-focal, well-oriented, appropriate affect Breasts: She has erythema and thickening of the left breast along the top portion and around the areola. No lesion found. No change with the right chest s/p mastectomy.    Lab Results  Component Value Date   WBC 6.9 06/29/2016   HGB 13.9 06/29/2016   HCT 39.8  06/29/2016   MCV 96 06/29/2016   PLT 101 (L) 06/29/2016   Lab Results  Component Value Date   FERRITIN 16 07/31/2015   IRON 54 07/31/2015   TIBC 344 07/31/2015   UIBC 290 07/31/2015   IRONPCTSAT 16 07/31/2015   Lab Results  Component Value Date   RBC 4.15 06/29/2016   No results found for: KPAFRELGTCHN, LAMBDASER, KAPLAMBRATIO Lab Results  Component Value Date   IGGSERUM TEST NOT PERFORMED 01/24/2015   IGGSERUM 835 01/24/2015   No results found for: TOTALPROTELP, ALBUMINELP, A1GS, A2GS, BETS, BETA2SER, GAMS, MSPIKE, SPEI   Chemistry      Component Value Date/Time   NA 139 06/29/2016 1128   K 5.0 06/29/2016 1128   CL 104 02/25/2016 1211   CL 104  03/28/2014 1008   CO2 24 06/29/2016 1128   BUN 15.9 06/29/2016 1128   CREATININE 1.1 06/29/2016 1128      Component Value Date/Time   CALCIUM 9.0 06/29/2016 1128   ALKPHOS 129 06/29/2016 1128   AST 36 (H) 06/29/2016 1128   ALT 24 06/29/2016 1128   BILITOT 1.78 (H) 06/29/2016 1128     Impression and Plan: She is a 75 yo white female with history of 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 Her tumor was ER positive and she completed 5 years of Femara in February 2015. She is here today with c/o of "fullness and pain around the nipple" of her left breast. On exam she has erythema and thickening of the left breast. She is tender on exam around the areola.  We will get a diagnostic mammogram this week to better evaluate.  We will go ahead and plan to see her back in 3 months for repeat lab work and follow-up. This is certainly subject to change depending on her mammogram results.  She is in agreement with the plan and will contact us with any questions or concerns. We can certaily see her sooner if need be.      Eliezer Bottom, NP 12/4/20173:53 PM

## 2016-06-29 NOTE — Telephone Encounter (Signed)
Spoke with the patient. Scheduled for 08/14/16 at [facility]. Instructions mailed. Copy of previous diabetic instructions reprinted and mailed to the patient.

## 2016-06-30 ENCOUNTER — Encounter (HOSPITAL_COMMUNITY)
Admission: RE | Admit: 2016-06-30 | Discharge: 2016-06-30 | Disposition: A | Discharge status: Home / Self Care | Payer: PPO | Source: Ambulatory Visit | Attending: Pulmonary Disease | Admitting: Pulmonary Disease

## 2016-06-30 VITALS — Wt 156.7 lb

## 2016-06-30 DIAGNOSIS — J42 Unspecified chronic bronchitis: Secondary | ICD-10-CM

## 2016-06-30 DIAGNOSIS — J449 Chronic obstructive pulmonary disease, unspecified: Secondary | ICD-10-CM | POA: Insufficient documentation

## 2016-06-30 LAB — AMMONIA: AMMONIA, PLASMA: 226 ug/dL — AB (ref 19–87)

## 2016-06-30 NOTE — Progress Notes (Signed)
Daily Session Note  Patient Details  Name: Ashley Savage MRN: [id number] Date of Birth: 04/30/1941 Referring Provider:   [name] [facility] Walk Test from 05/07/2016 in [place]  Referring Provider  Dr. Elsworth Soho      Encounter Date: 06/30/2016  Check In:     Session Check In - 06/30/16 1510      Check-In   Location [facility]   Staff Present Trish Fountain, RN, Maxcine Ham, RN, BSN;Molly diVincenzo, MS, ACSM RCEP, Exercise Physiologist;Lisa Ysidro Evert, RN   Supervising physician immediately available to respond to emergencies [facility] immediately available   Physician(s) Dr. Ree Kida   Medication changes reported     No   Fall or balance concerns reported    No   Warm-up and Cool-down Performed as group-led instruction   Resistance Training Performed Yes   VAD Patient? No     Pain Assessment   Currently in Pain? No/denies   Multiple Pain Sites No      Capillary Blood Glucose: No results found for this or any previous visit (from the past 24 hour(s)).      Exercise Prescription Changes - 06/30/16 1550      Response to Exercise   Blood Pressure (Admit) 104/60   Blood Pressure (Exercise) 110/60   Blood Pressure (Exit) 124/56   Heart Rate (Admit) 80 bpm   Heart Rate (Exercise) 93 bpm   Heart Rate (Exit) 82 bpm   Oxygen Saturation (Admit) 93 %   Oxygen Saturation (Exercise) 93 %   Oxygen Saturation (Exit) 96 %   Rating of Perceived Exertion (Exercise) 11   Perceived Dyspnea (Exercise) 1   Duration Progress to 45 minutes of aerobic exercise without signs/symptoms of physical distress   Intensity THRR unchanged     Progression   Progression Continue to progress workloads to maintain intensity without signs/symptoms of physical distress.     Resistance Training   Training Prescription Yes   Weight green bands   Reps 10-12  10 minutes of strength training     Interval Training   Interval Training  No     NuStep   Level 3   Minutes 34   METs 1.3     Arm Ergometer   Level --   Minutes --     Track   Laps 8   Minutes 17     Goals Met:  Independence with exercise equipment Improved SOB with ADL's Using PLB without cueing & demonstrates good technique Exercise tolerated well No report of cardiac concerns or symptoms Strength training completed today  Goals Unmet:  Not Applicable  Comments: Service time is from 1330 to 1515   Dr. Rush Farmer is Medical Director for Pulmonary Rehab at [facility].

## 2016-06-30 NOTE — Progress Notes (Signed)
Ashley Savage 75 y.o. female Nutrition Note Spoke with pt. There are some ways the pt can make her eating habits healthier. Pt's Rate Your Plate results reviewed with pt. Pt does not avoid salty food; uses canned/ convenience food. Pt does not watch her sodium intake and does not read food labels. 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 Pt has difficulty buying food. Per discussion, pt participates in a Senior meals group, which provides 12 free meals a month. Pt had difficulty buying her Levemir and received a $100 off from the diabetes association, "which helped." Pt checks CBG's 2 times a day.  Fasting CBG's reportedly 115 mg/dL before meals. Pt states she sometimes wakes up with CBG's in the 80's-90's "which is too low for me; I can hardly get out of bed when it's that low." Pt's last a1c indicates blood glucose well-controlled. Pt has regular hard candy beside her bed if needed. Pt expressed understanding of the information reviewed via feedback method.   Lab Results  Component Value Date   HGBA1C 6.3 02/25/2016   Nutrition Diagnosis ? Food-and nutrition-related knowledge deficit related to lack of exposure to information as related to diagnosis of pulmonary disease Nutrition Intervention ? Pt's individual nutrition plan and goals reviewed with pt. ? Benefits of adopting healthy eating habits discussed when pt's Rate Your Plate reviewed. ? Pt to attend the Nutrition and Lung Disease class ? Continual client-centered nutrition education by RD, as part of interdisciplinary care. Goal(s) 1. Pt to identify and limit food sources of sodium. Monitor and Evaluate progress toward nutrition goal with team.   Derek Mound, M.Ed, RD, LDN, CDE 06/30/2016 2:32 PM

## 2016-06-30 NOTE — Progress Notes (Signed)
Pulmonary Individual Treatment Plan  Patient Details  Name: Ashley Savage MRN: [id number] Date of Birth: [birth year] Referring Provider:   [name] Pulmonary Rehab Walk Test from 05/07/2016 in [place]  Referring Provider  Dr. Elsworth Soho      Initial Encounter Date:  Flowsheet Row Pulmonary Rehab Walk Test from 05/07/2016 in [place]  Date  05/07/16  Referring Provider  Dr. Elsworth Soho      Visit Diagnosis: Chronic bronchitis, unspecified chronic bronchitis type ([place])  Patient's Home Medications on Admission:   Current Outpatient Prescriptions:  .  albuterol (ACCUNEB) 1.25 MG/3ML nebulizer solution, Take 1 ampule by nebulization as needed for wheezing., Disp: , Rfl:  .  albuterol (PROVENTIL HFA;VENTOLIN HFA) 108 (90 Base) MCG/ACT inhaler, Inhale 2 puffs into the lungs every 6 (six) hours as needed for wheezing or shortness of breath. Only dispense Ventolin, Disp: 1 Inhaler, Rfl: 3 .  BD PEN NEEDLE NANO U/F 32G X 4 MM MISC, USE AS DIRECTED WITH LEVEMIR FLEXPEN, Disp: 100 each, Rfl: 0 .  cetirizine (ZYRTEC) 10 MG tablet, Take 10 mg by mouth daily as needed. , Disp: , Rfl:  .  Cholecalciferol (VITAMIN D3) 2000 units TABS, Take 2,000 Units by mouth every morning., Disp: , Rfl:  .  doxycycline (VIBRA-TABS) 100 MG tablet, Take 1 tablet (100 mg total) by mouth daily., Disp: 7 tablet, Rfl: 0 .  furosemide (LASIX) 20 MG tablet, TAKE 1 TABLET BY MOUTH TWICE DAILY, Disp: 60 tablet, Rfl: 0 .  guaiFENesin (MUCINEX) 600 MG 12 hr tablet, Take 1 tablet (600 mg total) by mouth 2 (two) times daily., Disp: 40 tablet, Rfl: 0 .  insulin aspart (NOVOLOG) 100 UNIT/ML injection, 4 units SQ q lunch daily and prn Sliding scale:  BS <200 no units BS 201-250 use 2 units BS 251-300 use 4 units BS 301-350 use 6 units BS 351-400 use 8 units BS 401-450 use 10 units BS>451 call MD, Disp: 10 mL, Rfl: 3 .  Insulin Detemir (LEVEMIR FLEXPEN) 100 UNIT/ML Pen,  Inject 14 Units into the skin daily at 10 pm., Disp: 15 mL, Rfl: 5 .  Insulin Syringe-Needle U-100 31G X 5/16" 1 ML MISC, To use w/ Novolog, Disp: 100 each, Rfl: 12 .  lactulose (CHRONULAC) 10 GM/15ML solution, TAKE 45 MLS BY MOUTH TWICE DAILY AS NEEDED FOR MILD CONSTIPATION., Disp: 1892 mL, Rfl: 6 .  NONFORMULARY OR COMPOUNDED ITEM, [place] compound:  Authorized Substitiute Pain Cream - Ibuprofen 15%, Baclofen 1%, Gabapentin 3%, Lidocaine 2%, dispense 120 grams, apply 1-2 grams to affected area 3-4 times daily, +3Refills., Disp: 120 each, Rfl: 3 .  omeprazole (PRILOSEC) 20 MG capsule, TAKE 1 CAPSULE(20 MG) BY MOUTH DAILY, Disp: 90 capsule, Rfl: 3 .  ONE TOUCH ULTRA TEST test strip, USE TWICE DAILY TO CHECK BLOOD SUGAR AS DIRECTED, Disp: 100 each, Rfl: 4 .  ranitidine (ZANTAC) 150 MG tablet, Take 1 tablet (150 mg total) by mouth 2 (two) times daily., Disp: 30 tablet, Rfl: 5 .  sodium chloride (OCEAN) 0.65 % SOLN nasal spray, Place 1 spray into both nostrils as needed for congestion., Disp: 30 mL, Rfl: 0 .  SPIRIVA RESPIMAT 2.5 MCG/ACT AERS, Inhale 2.5 mcg into the lungs daily as needed., Disp: , Rfl:  .  spironolactone (ALDACTONE) 50 MG tablet, TAKE 2 TABLETS BY MOUTH DAILY FOR A TOTAL OF 100 MG, Disp: 60 tablet, Rfl: 0 .  Tiotropium Bromide Monohydrate (SPIRIVA RESPIMAT) 1.25 MCG/ACT AERS, Inhale  2 puffs into the lungs daily., Disp: 2 Inhaler, Rfl: 0  Past Medical History: Past Medical History:  Diagnosis Date  . Anxiety   . Arthritis of both knees 10/01/2013  . Benign paroxysmal positional vertigo 10/01/2013  . Cancer [facility]) breast ca  right  . COPD (chronic obstructive pulmonary disease) ([place]) 10/01/2013  . Depression   . Diabetes mellitus type 2  . Emphysema   . Encephalopathy, hepatic ([place]) 06/07/2014  . Esophageal reflux 10/01/2013  . Hyperlipidemia   . Hyperlipidemia, mixed   . Increased ammonia level 11/25/2014  . NASH (nonalcoholic steatohepatitis) 08/26/2015  . Neck pain 10/01/2013   . Neuropathy (HCC)    feet   . Overactive bladder 12/10/2013  . Panic attacks   . Pedal edema 12/10/2013  . Preventative health care 03/08/2016  . Tobacco abuse disorder 02/01/2014    Tobacco Use: History  Smoking Status  . Current Some Day Smoker  . Packs/day: 0.50  . Years: 58.00  . Types: Cigarettes  . Start date: 07/27/1964  Smokeless Tobacco  . Never Used    Comment: 1 pack per week, tobacco infor given 12/30/15    Labs: Recent Review Flowsheet Data    Labs for ITP Cardiac and Pulmonary Rehab Latest Ref Rng & Units 10/15/2014 03/26/2015 07/03/2015 08/26/2015 02/25/2016   Cholestrol 0 - 200 mg/dL - 151 - 157 187   LDLCALC 0 - 99 mg/dL - 93 - 107(H) 108(H)   HDL >39.00 mg/dL - 42.90 - 38.10(L) 55.80   Trlycerides 0.0 - 149.0 mg/dL - 74.0 - 58.0 112.0   Hemoglobin A1c 4.6 - 6.5 % - 6.6(H) 6.5 - 6.3   PHART 7.350 - 7.450 7.426 - - - -   PCO2ART 35.0 - 45.0 mmHg 37.2 - - - -   HCO3 20.0 - 24.0 mEq/L 24.5(H) - - - -   TCO2 0 - 100 mmol/L 26 - - - -   O2SAT % 95.0 - - - -      Capillary Blood Glucose: Lab Results  Component Value Date   GLUCAP 150 (H) 08/01/2015   GLUCAP 240 (H) 08/01/2015   GLUCAP 154 (H) 08/01/2015   GLUCAP 162 (H) 08/01/2015   GLUCAP 185 (H) 08/01/2015       POCT Glucose    Row Name 05/14/16 1612 05/19/16 1547 05/21/16 1633 05/26/16 1608 05/28/16 1650     POCT Blood Glucose   Pre-Exercise 173 mg/dL 192 mg/dL 220 mg/dL 171 mg/dL 192 mg/dL   Post-Exercise 142 mg/dL  - 140 mg/dL 131 mg/dL  -   Row Name 06/02/16 1550 06/04/16 1616 06/11/16 1610 06/16/16 1515 06/23/16 1608     POCT Blood Glucose   Pre-Exercise 179 mg/dL 179 mg/dL 120 mg/dL 208 mg/dL 178 mg/dL   Post-Exercise 127 mg/dL 135 mg/dL 167 mg/dL 115 mg/dL 127 mg/dL      ADL UCSD:     Pulmonary Assessment Scores    Row Name 05/20/16 0925         ADL UCSD   ADL Phase Entry     SOB Score total 60       CAT Score   CAT Score 17        Pulmonary Function Assessment:     Pulmonary  Function Assessment - 05/04/16 1047      Post Bronchodilator Spirometry Results   FVC% 1.85 %   FEV1% 40 %   FEV1/FVC Ratio 54      Exercise Target Goals:    Exercise Program  Goal: Individual exercise prescription set with THRR, safety & activity barriers. Participant demonstrates ability to understand and report RPE using BORG scale, to self-measure pulse accurately, and to acknowledge the importance of the exercise prescription.  Exercise Prescription Goal: Starting with aerobic activity 30 plus minutes a day, 3 days per week for initial exercise prescription. Provide home exercise prescription and guidelines that participant acknowledges understanding prior to discharge.  Activity Barriers & Risk Stratification:     Activity Barriers & Cardiac Risk Stratification - 05/04/16 1027      Activity Barriers & Cardiac Risk Stratification   Activity Barriers Arthritis;Deconditioning;Balance Concerns      6 Minute Walk:     6 Minute Walk    Row Name 05/07/16 1618         6 Minute Walk   Phase Initial     Distance 832 feet     Walk Time 6 minutes     # of Rest Breaks 0     MPH 1.57     METS 2.23     RPE 11     Perceived Dyspnea  1     Symptoms Yes (comment)     Comments knee pain     Resting HR 87 bpm     Resting BP 100/62     Max Ex. HR 94 bpm     Max Ex. BP 130/60       Interval HR   Baseline HR 87     1 Minute HR 88     2 Minute HR 94     3 Minute HR 94     4 Minute HR 90     5 Minute HR 92     6 Minute HR 91     2 Minute Post HR 90     Interval Heart Rate? Yes       Interval Oxygen   Interval Oxygen? Yes     Baseline Oxygen Saturation % 94 %     Baseline Liters of Oxygen 9 L     1 Minute Oxygen Saturation % 94 %     1 Minute Liters of Oxygen 0 L     2 Minute Oxygen Saturation % 92 %     2 Minute Liters of Oxygen 0 L     3 Minute Oxygen Saturation % 92 %     3 Minute Liters of Oxygen 0 L     4 Minute Oxygen Saturation % 94 %     4 Minute Liters of  Oxygen 0 L     5 Minute Oxygen Saturation % 95 %     5 Minute Liters of Oxygen 0 L     6 Minute Oxygen Saturation % 96 %     6 Minute Liters of Oxygen 0 L     2 Minute Post Oxygen Saturation % 97 %     2 Minute Post Liters of Oxygen 0 L        Initial Exercise Prescription:     Initial Exercise Prescription - 05/07/16 1600      Date of Initial Exercise RX and Referring Provider   Date 05/07/16   Referring Provider Dr. Elsworth Soho     NuStep   Level 2   Minutes 17   METs 1.5     Arm Ergometer   Level 1   Minutes 17     Track   Laps 5   Minutes 17     Prescription Details  Frequency (times per week) 2   Duration Progress to 45 minutes of aerobic exercise without signs/symptoms of physical distress     Intensity   THRR 40-80% of Max Heartrate 59-118   Ratings of Perceived Exertion 11-13   Perceived Dyspnea 0-4     Progression   Progression Continue progressive overload as per policy without signs/symptoms or physical distress.     Resistance Training   Training Prescription Yes   Weight green bands   Reps 10-12      Perform Capillary Blood Glucose checks as needed.  Exercise Prescription Changes:     Exercise Prescription Changes    Row Name 05/14/16 1600 05/21/16 1600 05/26/16 1600 05/28/16 1647 06/02/16 1500     Exercise Review   Progression  - Yes  - Yes  -     Response to Exercise   Blood Pressure (Admit) 98/42 104/60 102/50 110/50 104/50   Blood Pressure (Exercise) 136/66 110/64 130/50 124/70 100/60   Blood Pressure (Exit) 104/60 114/60 100/40 112/68 100/50   Heart Rate (Admit) 70 bpm 73 bpm 71 bpm 76 bpm 80 bpm   Heart Rate (Exercise) 85 bpm 79 bpm 81 bpm 92 bpm 107 bpm   Heart Rate (Exit) 80 bpm 80 bpm 75 bpm 84 bpm 89 bpm   Oxygen Saturation (Admit) 97 % 94 % 94 % 97 % 93 %   Oxygen Saturation (Exercise) 95 % 95 % 93 % 96 % 88 %   Oxygen Saturation (Exit) 95 % 91 % 91 % 84 % 93 %   Rating of Perceived Exertion (Exercise) '15 13 11 11 13    ' Perceived Dyspnea (Exercise) '2 1 2 2 1   ' Duration Progress to 45 minutes of aerobic exercise without signs/symptoms of physical distress Progress to 45 minutes of aerobic exercise without signs/symptoms of physical distress Progress to 45 minutes of aerobic exercise without signs/symptoms of physical distress Progress to 45 minutes of aerobic exercise without signs/symptoms of physical distress Progress to 45 minutes of aerobic exercise without signs/symptoms of physical distress   Intensity -  40-80% HRR THRR unchanged THRR unchanged THRR unchanged THRR unchanged     Progression   Progression  - Continue to progress workloads to maintain intensity without signs/symptoms of physical distress. Continue to progress workloads to maintain intensity without signs/symptoms of physical distress. Continue to progress workloads to maintain intensity without signs/symptoms of physical distress. Continue to progress workloads to maintain intensity without signs/symptoms of physical distress.     Resistance Training   Training Prescription Yes Yes Yes Yes Yes   Weight green bands  10 min green bands green bands green bands green bands   Reps 10-12 10-12  10 minutes of strength training 10-12  10 minutes of strength training 10-12  10 minutes of strength training 10-12  10 minutes of strength training     Interval Training   Interval Training  - No No No No     Oxygen   Oxygen -  room air  -  -  -  -     NuStep   Level  - 2  - 3 3   Minutes  - '17 17 17 17   ' METs  - 1.3 1.7 2.4 1.5     Arm Ergometer   Level '1 1 1  ' - 1   Minutes '17 17 17  ' - 17     Track   Laps 8  - '8 9 9   ' Minutes 17  -  '17 17 17   ' Row Name 06/04/16 1613 06/09/16 1500 06/11/16 1608 06/16/16 1513 06/23/16 1600     Response to Exercise   Blood Pressure (Admit) 110/52 120/50 102/50 121/67 115/63   Blood Pressure (Exercise) 110/62 104/60 120/52 114/70 98/42   Blood Pressure (Exit) 104/60 120/60 118/80 100/62 96/50   Heart Rate  (Admit) 76 bpm 79 bpm 85 bpm 74 bpm 83 bpm   Heart Rate (Exercise) 94 bpm 99 bpm 98 bpm 94 bpm 76 bpm   Heart Rate (Exit) 84 bpm 55 bpm 82 bpm 77 bpm 73 bpm   Oxygen Saturation (Admit) 94 % 95 % 91 % 98 % 96 %   Oxygen Saturation (Exercise) 89 % 94 % 91 % 93 % 94 %   Oxygen Saturation (Exit) 95 % 91 % 82 % 95 % 95 %   Rating of Perceived Exertion (Exercise) '13 13 11 15 11   ' Perceived Dyspnea (Exercise) '1 3 1 1 1   ' Duration Progress to 45 minutes of aerobic exercise without signs/symptoms of physical distress Progress to 45 minutes of aerobic exercise without signs/symptoms of physical distress Progress to 45 minutes of aerobic exercise without signs/symptoms of physical distress Progress to 45 minutes of aerobic exercise without signs/symptoms of physical distress Progress to 45 minutes of aerobic exercise without signs/symptoms of physical distress   Intensity THRR unchanged THRR unchanged THRR unchanged THRR unchanged THRR unchanged     Progression   Progression Continue to progress workloads to maintain intensity without signs/symptoms of physical distress. Continue to progress workloads to maintain intensity without signs/symptoms of physical distress. Continue to progress workloads to maintain intensity without signs/symptoms of physical distress. Continue to progress workloads to maintain intensity without signs/symptoms of physical distress. Continue to progress workloads to maintain intensity without signs/symptoms of physical distress.     Resistance Training   Training Prescription Yes Yes Yes Yes Yes   Weight green bands green bands green bands green bands green bands   Reps 10-12  10 minutes of strength training 10-12  10 minutes of strength training 10-12  10 minutes of strength training 10-12  10 minutes of strength training 10-12  10 minutes of strength training     Interval Training   Interval Training No No No No No     NuStep   Level '3 3 3 3 3   ' Minutes '17 17 17 17 17    ' METs  - 1.5 1.5 1.5 -     Arm Ergometer   Level 1 1 - 1 1   Minutes 17 17 - 17 17     Track   Laps  - '7 8 10 9   ' Minutes  - '17 17 17 17      ' Exercise Comments:     Exercise Comments    Row Name 06/01/16 1031 06/29/16 1700         Exercise Comments Patient has difficulty with intensity increases. Has only attended 4 exercise sessions. Will cont. to monitor.  Patient has difficulty with intensity increases. Very "spacey" when she is here. Will cont. to monitor.          Discharge Exercise Prescription (Final Exercise Prescription Changes):     Exercise Prescription Changes - 06/23/16 1600      Response to Exercise   Blood Pressure (Admit) 115/63   Blood Pressure (Exercise) 98/42   Blood Pressure (Exit) 96/50   Heart Rate (Admit) 83 bpm   Heart Rate (Exercise) 76 bpm  Heart Rate (Exit) 73 bpm   Oxygen Saturation (Admit) 96 %   Oxygen Saturation (Exercise) 94 %   Oxygen Saturation (Exit) 95 %   Rating of Perceived Exertion (Exercise) 11   Perceived Dyspnea (Exercise) 1   Duration Progress to 45 minutes of aerobic exercise without signs/symptoms of physical distress   Intensity THRR unchanged     Progression   Progression Continue to progress workloads to maintain intensity without signs/symptoms of physical distress.     Resistance Training   Training Prescription Yes   Weight green bands   Reps 10-12  10 minutes of strength training     Interval Training   Interval Training No     NuStep   Level 3   Minutes 17   METs --     Arm Ergometer   Level 1   Minutes 17     Track   Laps 9   Minutes 17       Nutrition:  Target Goals: Understanding of nutrition guidelines, daily intake of sodium <1517m, cholesterol <204m calories 30% from fat and 7% or less from saturated fats, daily to have 5 or more servings of fruits and vegetables.  Biometrics:     Pre Biometrics - 05/04/16 1037      Pre Biometrics   Grip Strength 29 kg       Nutrition  Therapy Plan and Nutrition Goals:   Nutrition Discharge: Rate Your Plate Scores:   Psychosocial: Target Goals: Acknowledge presence or absence of depression, maximize coping skills, provide positive support system. Participant is able to verbalize types and ability to use techniques and skills needed for reducing stress and depression.  Initial Review & Psychosocial Screening:     Initial Psych Review & Screening - 05[place]04/16 [place]  Not the best support system, but has one   Strains Intra-family strains;Illness and family care strain  sister in wheel chair, neice is helpful, but [place]     Barriers   Psychosocial barriers to participate in program There are no identifiable barriers or psychosocial needs.     Screening Interventions   Interventions Encouraged to exercise      Quality of Life Scores:     Quality of Life - 05/20/16 0925      Quality of Life Scores   Health/Function Pre 14.69 %   Socioeconomic Pre 12.42 %   Psych/Spiritual Pre 16.36 %   Family Pre 0 %   GLOBAL Pre 14.62 %      PHQ-9: Recent Review Flowsheet Data    [facility] 05/04/2016 02/25/2016 02/08/2015 02/08/2015 12/26/2014   Decreased Interest 0 0 - 0 0   Down, Depressed, Hopeless 0 0 1 - 0   PHQ - 2 Score 0 0 1 0 0      Psychosocial Evaluation and Intervention:     Psychosocial Evaluation - 05/04/16 1044      Psychosocial Evaluation & Interventions   Interventions Encouraged to exercise with the program and follow exercise prescription   Continued Psychosocial Services Needed No      Psychosocial Re-Evaluation:     Psychosocial Re-Evaluation    [place] 05/26/16 1142 05/26/16 1143 06/22/16 1258         Psychosocial Re-Evaluation   Interventions Encouraged to attend Pulmonary Rehabilitation for the exercise  - Encouraged to attend Pulmonary Rehabilitation for the exercise  Comments  - No  psychosocial issues identified at this time No psychosocial issues identified at this time.     Continued Psychosocial Services Needed  - No No       Education: Education Goals: Education classes will be provided on a weekly basis, covering required topics. Participant will state understanding/return demonstration of topics presented.  Learning Barriers/Preferences:     Learning Barriers/Preferences - 05/04/16 1031      Learning Barriers/Preferences   Learning Barriers None   Learning Preferences Written Material;Video;Verbal Instruction;Skilled Demonstration;Pictoral;Individual Instruction;Group Instruction;Computer/Internet;Audio      Education Topics: Risk Factor Reduction:  -Group instruction that is supported by a PowerPoint presentation. Instructor discusses the definition of a risk factor, different risk factors for pulmonary disease, and how the heart and lungs work together.     Nutrition for Pulmonary Patient:  -Group instruction provided by PowerPoint slides, verbal discussion, and written materials to support subject matter. The instructor gives an explanation and review of healthy diet recommendations, which includes a discussion on weight management, recommendations for fruit and vegetable consumption, as well as protein, fluid, caffeine, fiber, sodium, sugar, and alcohol. Tips for eating when patients are short of breath are discussed. Flowsheet Row PULMONARY REHAB CHRONIC OBSTRUCTIVE PULMONARY DISEASE from 06/25/2016 in [place]  Date  06/04/16 [facility] Eating During the [place]  Educator  RD  Instruction Review Code  2- meets goals/outcomes      Pursed Lip Breathing:  -Group instruction that is supported by demonstration and informational handouts. Instructor discusses the benefits of pursed lip and diaphragmatic breathing and detailed demonstration on how to preform both.     Oxygen Safety:  -Group instruction provided by  PowerPoint, verbal discussion, and written material to support subject matter. There is an overview of "What is Oxygen" and "Why do we need it".  Instructor also reviews how to create a safe environment for oxygen use, the importance of using oxygen as prescribed, and the risks of noncompliance. There is a brief discussion on traveling with oxygen and resources the patient may utilize. Flowsheet Row PULMONARY REHAB CHRONIC OBSTRUCTIVE PULMONARY DISEASE from 06/25/2016 in [place]  Date  05/28/16  Educator  rn  Instruction Review Code  2- meets goals/outcomes      Oxygen Equipment:  -Group instruction provided by [employer] Staff utilizing handouts, written materials, and equipment demonstrations. Flowsheet Row PULMONARY REHAB CHRONIC OBSTRUCTIVE PULMONARY DISEASE from 06/25/2016 in [place]  Date  06/11/16  Educator  Rep  Instruction Review Code  2- meets goals/outcomes      Signs and Symptoms:  -Group instruction provided by written material and verbal discussion to support subject matter. Warning signs and symptoms of infection, stroke, and heart attack are reviewed and when to call the physician/911 reinforced. Tips for preventing the spread of infection discussed.   Advanced Directives:  -Group instruction provided by verbal instruction and written material to support subject matter. Instructor reviews Advanced Directive laws and proper instruction for filling out document.   Pulmonary Video:  -Group video education that reviews the importance of medication and oxygen compliance, exercise, good nutrition, pulmonary hygiene, and pursed lip and diaphragmatic breathing for the pulmonary patient. Flowsheet Row PULMONARY REHAB CHRONIC OBSTRUCTIVE PULMONARY DISEASE from 06/25/2016 in [place]  Date  05/14/16  Educator  video  Instruction Review Code  2- meets goals/outcomes       Exercise for the Pulmonary Patient:  -  Group instruction that is supported by a PowerPoint presentation. Instructor discusses benefits of exercise, core components of exercise, frequency, duration, and intensity of an exercise routine, importance of utilizing pulse oximetry during exercise, safety while exercising, and options of places to exercise outside of rehab.   Flowsheet Row PULMONARY REHAB CHRONIC OBSTRUCTIVE PULMONARY DISEASE from 06/25/2016 in [place]  Date  06/25/16  Educator  EP  Instruction Review Code  2- meets goals/outcomes      Pulmonary Medications:  -Verbally interactive group education provided by instructor with focus on inhaled medications and proper administration.   Anatomy and Physiology of the Respiratory System and Intimacy:  -Group instruction provided by PowerPoint, verbal discussion, and written material to support subject matter. Instructor reviews respiratory cycle and anatomical components of the respiratory system and their functions. Instructor also reviews differences in obstructive and restrictive respiratory diseases with examples of each. Intimacy, Sex, and Sexuality differences are reviewed with a discussion on how relationships can change when diagnosed with pulmonary disease. Common sexual concerns are reviewed.   Knowledge Questionnaire Score:     Knowledge Questionnaire Score - 05/20/16 0924      Knowledge Questionnaire Score   Pre Score 8/13      Core Components/Risk Factors/Patient Goals at Admission:     Personal Goals and Risk Factors at Admission - 05/04/16 1038      Core Components/Risk Factors/Patient Goals on Admission   Increase Strength and Stamina Yes   Intervention Provide advice, education, support and counseling about physical activity/exercise needs.;Develop an individualized exercise prescription for aerobic and resistive training based on initial evaluation findings, risk  stratification, comorbidities and participant's personal goals.   Expected Outcomes Achievement of increased cardiorespiratory fitness and enhanced flexibility, muscular endurance and strength shown through measurements of functional capacity and personal statement of participant.   Tobacco Cessation Yes   Number of packs per day 0-2 cigarettes/day   Intervention Assist the participant in steps to quit. Provide individualized education and counseling about committing to Tobacco Cessation, relapse prevention, and pharmacological support that can be provided by physician.;Advice worker, assist with locating and accessing local/national Quit Smoking programs, and support quit date choice.   Expected Outcomes Short Term: Will quit all tobacco product use, adhering to prevention of relapse plan.   Improve shortness of breath with ADL's Yes   Intervention Provide education, individualized exercise plan and daily activity instruction to help decrease symptoms of SOB with activities of daily living.   Expected Outcomes Short Term: Achieves a reduction of symptoms when performing activities of daily living.   Develop more efficient breathing techniques such as purse lipped breathing and diaphragmatic breathing; and practicing self-pacing with activity Yes   Intervention Provide education, demonstration and support about specific breathing techniuqes utilized for more efficient breathing. Include techniques such as pursed lipped breathing, diaphragmatic breathing and self-pacing activity.   Expected Outcomes Short Term: Participant will be able to demonstrate and use breathing techniques as needed throughout daily activities.   Increase knowledge of respiratory medications and ability to use respiratory devices properly  Yes      Core Components/Risk Factors/Patient Goals Review:      Goals and Risk Factor Review    Row Name 05/26/16 1139 06/22/16 1255           Core Components/Risk  Factors/Patient Goals Review   Personal Goals Review Tobacco Cessation;Increase knowledge of respiratory medications and ability to use respiratory devices properly.;Improve shortness of breath with ADL's;Develop more efficient  breathing techniques such as purse lipped breathing and diaphragmatic breathing and practicing self-pacing with activity.;Increase Strength and Stamina Tobacco Cessation;Increase knowledge of respiratory medications and ability to use respiratory devices properly.;Improve shortness of breath with ADL's;Develop more efficient breathing techniques such as purse lipped breathing and diaphragmatic breathing and practicing self-pacing with activity.;Increase Strength and Stamina      Review Has attended 3 exercise sessions, too early to see any progress towards goals.  Is not ready to quit smoking at this time, support given. Still is not ready to quit cigarettes, walking 10 LAPS ON TRACK, NUSTEP LEVEL 3, arm ergometer level 2.      Expected Outcomes Expect to see an increase in strength and stamina in the next 30 days Try to encourage smoking cessation in hopes she will move closer to deciding to quit.  Expect strength to increase.         Core Components/Risk Factors/Patient Goals at Discharge (Final Review):      Goals and Risk Factor Review - 06/22/16 1255      Core Components/Risk Factors/Patient Goals Review   Personal Goals Review Tobacco Cessation;Increase knowledge of respiratory medications and ability to use respiratory devices properly.;Improve shortness of breath with ADL's;Develop more efficient breathing techniques such as purse lipped breathing and diaphragmatic breathing and practicing self-pacing with activity.;Increase Strength and Stamina   Review Still is not ready to quit cigarettes, walking 10 LAPS ON TRACK, NUSTEP LEVEL 3, arm ergometer level 2.   Expected Outcomes Try to encourage smoking cessation in hopes she will move closer to deciding to quit.  Expect  strength to increase.      ITP Comments:   Comments: ITP REVIEW Pt is making expected progress toward pulmonary rehab goals after completing 10 sessions. Recommend continued exercise, life style modification, education, and utilization of breathing techniques to increase stamina and strength and decrease shortness of breath with exertion.

## 2016-07-01 ENCOUNTER — Telehealth: Payer: Self-pay

## 2016-07-01 NOTE — Telephone Encounter (Addendum)
-----   Message from Eliezer Bottom, NP sent at 06/30/2016  1:38 PM EST ----- Regarding: Ammonia level  Is she taking lactulose (she told me yesterday she was but isn't on med list and lab work suggests possibly not) as prescribed? If so, who prescribed and what dose? Thanks!  Sarah  Left message for pt to contact office re: medications and lab results.  Lab results forwarded to Dr. Charlett Blake, who had previously (per Epic - 03/02/16) prescribed Lactulose. Judson Roch, NP aware. dph

## 2016-07-02 ENCOUNTER — Encounter: Payer: Self-pay | Admitting: Gastroenterology

## 2016-07-02 ENCOUNTER — Encounter (HOSPITAL_COMMUNITY)
Admission: RE | Admit: 2016-07-02 | Discharge: 2016-07-02 | Disposition: A | Discharge status: Home / Self Care | Payer: PPO | Source: Ambulatory Visit | Attending: Pulmonary Disease | Admitting: Pulmonary Disease

## 2016-07-02 VITALS — Wt 157.2 lb

## 2016-07-02 DIAGNOSIS — J449 Chronic obstructive pulmonary disease, unspecified: Secondary | ICD-10-CM | POA: Diagnosis not present

## 2016-07-02 DIAGNOSIS — J42 Unspecified chronic bronchitis: Secondary | ICD-10-CM

## 2016-07-02 NOTE — Progress Notes (Signed)
Daily Session Note  Patient Details  Name: Ashley Savage MRN: [id number] Date of Birth: 12-04-1940 Referring Provider:   [name] Pulmonary Rehab Walk Test from 05/07/2016 in [place]  Referring Provider  Dr. Elsworth Soho      Encounter Date: 07/02/2016  Check In:     Session Check In - 07/02/16 1350      Check-In   Location [facility]   Staff Present Rosebud Poles, RN, BSN;Molly diVincenzo, MS, ACSM RCEP, Exercise Physiologist;Portia Rollene Rotunda, RN, Roque Cash, RN   Supervising physician immediately available to respond to emergencies [facility] immediately available   Physician(s) Dr. Allyson Sabal   Medication changes reported     No   Fall or balance concerns reported    No   Warm-up and Cool-down Performed as group-led instruction   Resistance Training Performed Yes   VAD Patient? No     Pain Assessment   Currently in Pain? No/denies   Multiple Pain Sites No      Capillary Blood Glucose: No results found for this or any previous visit (from the past 24 hour(s)).      Exercise Prescription Changes - 07/02/16 1500      Response to Exercise   Blood Pressure (Admit) 122/64   Blood Pressure (Exercise) 140/68   Blood Pressure (Exit) 118/70   Heart Rate (Admit) 77 bpm   Heart Rate (Exercise) 95 bpm   Heart Rate (Exit) 82 bpm   Oxygen Saturation (Admit) 94 %   Oxygen Saturation (Exercise) 96 %   Oxygen Saturation (Exit) 95 %   Rating of Perceived Exertion (Exercise) 11   Perceived Dyspnea (Exercise) 1   Duration Progress to 45 minutes of aerobic exercise without signs/symptoms of physical distress   Intensity THRR unchanged     Progression   Progression Continue to progress workloads to maintain intensity without signs/symptoms of physical distress.     Resistance Training   Training Prescription Yes   Weight green bands   Reps 10-12  10 minutes of strength training     Interval Training   Interval Training  No     Track   Laps 18   Minutes 34     Goals Met:  Exercise tolerated well Strength training completed today  Goals Unmet:  Not Applicable  Comments: Service time is from 1330 to 1535    Dr. Rush Farmer is Medical Director for Pulmonary Rehab at [facility].

## 2016-07-03 ENCOUNTER — Telehealth: Payer: Self-pay | Admitting: Pulmonary Disease

## 2016-07-03 ENCOUNTER — Encounter: Payer: Self-pay | Admitting: *Deleted

## 2016-07-03 DIAGNOSIS — K7682 Hepatic encephalopathy: Secondary | ICD-10-CM

## 2016-07-03 DIAGNOSIS — K7290 Hepatic failure, unspecified without coma: Secondary | ICD-10-CM

## 2016-07-03 DIAGNOSIS — Z170 Estrogen receptor positive status [ER+]: Principal | ICD-10-CM

## 2016-07-03 DIAGNOSIS — C50911 Malignant neoplasm of unspecified site of right female breast: Secondary | ICD-10-CM

## 2016-07-03 MED ORDER — SPIRIVA RESPIMAT 2.5 MCG/ACT IN AERS: 2.5000 ug | INHALATION_SPRAY | Freq: Every day | RESPIRATORY_TRACT | 3 refills | Status: DC | PRN

## 2016-07-03 NOTE — Telephone Encounter (Signed)
Patient assistance paperwork for Spiriva Resp is complete Mailed to [employer] placed in the mail back to the patient, per patient request. Address verified.  Copies are being kept in my possession until [employer] confirms this is received.  Nothing further needed.

## 2016-07-07 ENCOUNTER — Encounter (HOSPITAL_COMMUNITY)
Admission: RE | Admit: 2016-07-07 | Discharge: 2016-07-07 | Disposition: A | Discharge status: Home / Self Care | Payer: PPO | Source: Ambulatory Visit | Attending: Pulmonary Disease | Admitting: Pulmonary Disease

## 2016-07-07 VITALS — Wt 154.5 lb

## 2016-07-07 DIAGNOSIS — J449 Chronic obstructive pulmonary disease, unspecified: Secondary | ICD-10-CM | POA: Diagnosis not present

## 2016-07-07 DIAGNOSIS — J42 Unspecified chronic bronchitis: Secondary | ICD-10-CM

## 2016-07-07 NOTE — Progress Notes (Signed)
Daily Session Note  Patient Details  Name: Ashley Savage MRN: [id number] Date of Birth: 11/18/1940 Referring Provider:   [name] Pulmonary Rehab Walk Test from 05/07/2016 in [place]  Referring Provider  Dr. Elsworth Soho      Encounter Date: 07/07/2016  Check In:     Session Check In - 07/07/16 1330      Check-In   Location [facility]   Staff Present Rosebud Poles, RN, BSN;Molly diVincenzo, MS, ACSM RCEP, Exercise Physiologist;Sloka Volante Ysidro Evert, RN;Portia Rollene Rotunda, RN, BSN   Supervising physician immediately available to respond to emergencies [facility] immediately available   Physician(s) Dr. Nada Maclachlan   Medication changes reported     No   Fall or balance concerns reported    No   Warm-up and Cool-down Performed as group-led instruction   Resistance Training Performed Yes   VAD Patient? No     Pain Assessment   Currently in Pain? No/denies   Multiple Pain Sites No      Capillary Blood Glucose: No results found for this or any previous visit (from the past 24 hour(s)).     POCT Glucose - 07/07/16 1542      POCT Blood Glucose   Pre-Exercise 296 mg/dL   Post-Exercise 137 mg/dL         Exercise Prescription Changes - 07/07/16 1500      Response to Exercise   Blood Pressure (Admit) 110/50   Blood Pressure (Exercise) 106/56   Blood Pressure (Exit) 114/64   Heart Rate (Admit) 83 bpm   Heart Rate (Exercise) 97 bpm   Heart Rate (Exit) 84 bpm   Oxygen Saturation (Admit) 94 %   Oxygen Saturation (Exercise) 91 %   Oxygen Saturation (Exit) 91 %   Rating of Perceived Exertion (Exercise) 11   Perceived Dyspnea (Exercise) 1   Duration Progress to 45 minutes of aerobic exercise without signs/symptoms of physical distress   Intensity THRR unchanged     Progression   Progression Continue to progress workloads to maintain intensity without signs/symptoms of physical distress.     Resistance Training   Training Prescription  Yes   Weight green bands   Reps 10-12  10 minutes of strength training     Interval Training   Interval Training No     NuStep   Level 4   Minutes 17   METs 1.7     Track   Laps 18   Minutes 34     Goals Met:  Exercise tolerated well No report of cardiac concerns or symptoms Strength training completed today  Goals Unmet:  Not Applicable  Comments: Service time is from 1330 to 1500    Dr. Rush Farmer is Medical Director for Pulmonary Rehab at [facility].

## 2016-07-08 ENCOUNTER — Encounter: Payer: Self-pay | Admitting: Physician Assistant

## 2016-07-08 ENCOUNTER — Encounter: Payer: Self-pay | Admitting: Gastroenterology

## 2016-07-08 ENCOUNTER — Ambulatory Visit (INDEPENDENT_AMBULATORY_CARE_PROVIDER_SITE_OTHER): Payer: PPO | Admitting: Physician Assistant

## 2016-07-08 VITALS — BP 110/58 | HR 88 | Ht 67.0 in | Wt 156.4 lb

## 2016-07-08 DIAGNOSIS — K7682 Hepatic encephalopathy: Secondary | ICD-10-CM

## 2016-07-08 DIAGNOSIS — K7290 Hepatic failure, unspecified without coma: Secondary | ICD-10-CM

## 2016-07-08 DIAGNOSIS — K7460 Unspecified cirrhosis of liver: Secondary | ICD-10-CM | POA: Diagnosis not present

## 2016-07-08 MED ORDER — RIFAXIMIN 550 MG PO TABS: 550.0000 mg | ORAL_TABLET | Freq: Two times a day (BID) | ORAL | 11 refills | Status: DC

## 2016-07-08 NOTE — Patient Instructions (Signed)
Come to our lab on 12-29 Tuesday, basement level.  We sent a prescription to [facility]. They will be calling y ou. WE have given you samples of Xifaxan 550 mg. Take 1 tab twice daily.   Take Lactulose in crease to 90 cc's 3 times daily for 2 days until diarrhea then stay on to 60 cc's daily long term.  Call us 12-15  Friday morning if the increase of the [place] does not give you diarrhea.

## 2016-07-08 NOTE — Progress Notes (Signed)
Subjective:    [name], female    DOB: [birth year], 76 y.o.   MRN: [id number]  HPI "Ashley Savage" is a pleasant 75 year old white female known to Ashley Savage. She is followed for Ashley Savage cirrhosis, decompensated with history of hepatic encephalopathy and early varices. Her last office visit was in June 2017. She also has history of COPD, abdominal aortic aneurysm, adult-onset diabetes mellitus chronic bronchitis and history of breast cancer.Patient called a week or so ago with complaints of that her lactulose was not working like it is supposed to. She also mentioned feeling hot and cold. On the and there was concern that she could have an infection. Patient says that she has had hot and cold spells for years and that she hasn't noticed any difference lately. She has been taking 30 mL of Chronulac 3 times daily and says initially when she started the Chronulac this worked well and that she would have a couple of loose stools every day. Over the past month or so despite taking regular doses of lactulose she's just passing small amounts of bowel movement in her stool has not been loose at all. She says she may have one to 2 very small bowel movements per day. She also has had a lot of gas. She has no complaints of abdominal pain. Lives with encephalopathy and confusion in the past but says she hasn't been feeling that we recently. Her energy level has been okay she has been attending pulmonary rehabilitation, she hasn't noted any excessive sleepiness or clumsiness. No fever or chills.   She did have labs done through her oncologist on 06/29/2016; Venous ammonia was 226, hemoglobin 13.9 hematocrit of 39.8 platelets 101, glucose 160, BUN 15 creatinine 1.1 total bili 1.78 AST 36 ALT of 24 She has not been on Xifaxan and does not remember being prescribed Xifaxan.  Review of Systems Pertinent positive and negative review of systems were noted in the above HPI section.  All other review of systems was  otherwise negative.  Outpatient Encounter Prescriptions as of 07/08/2016  Medication Sig  . albuterol (ACCUNEB) 1.25 MG/3ML nebulizer solution Take 1 ampule by nebulization as needed for wheezing.  Marland Kitchen albuterol (PROVENTIL HFA;VENTOLIN HFA) 108 (90 Base) MCG/ACT inhaler Inhale 2 puffs into the lungs every 6 (six) hours as needed for wheezing or shortness of breath. Only dispense Ventolin  . BD PEN NEEDLE NANO U/F 32G X 4 MM MISC USE AS DIRECTED WITH LEVEMIR FLEXPEN  . cetirizine (ZYRTEC) 10 MG tablet Take 10 mg by mouth daily as needed.   . Cholecalciferol (VITAMIN D3) 2000 units TABS Take 2,000 Units by mouth every morning.  . furosemide (LASIX) 20 MG tablet TAKE 1 TABLET BY MOUTH TWICE DAILY  . guaiFENesin (MUCINEX) 600 MG 12 hr tablet Take 1 tablet (600 mg total) by mouth 2 (two) times daily.  . insulin aspart (NOVOLOG) 100 UNIT/ML injection 4 units SQ q lunch daily and prn Sliding scale:  BS <200 no units BS 201-250 use 2 units BS 251-300 use 4 units BS 301-350 use 6 units BS 351-400 use 8 units BS 401-450 use 10 units BS>451 call MD  . Insulin Detemir (LEVEMIR FLEXPEN) 100 UNIT/ML Pen Inject 14 Units into the skin daily at 10 pm.  . Insulin Syringe-Needle U-100 31G X 5/16" 1 ML MISC To use w/ Novolog  . lactulose (CHRONULAC) 10 GM/15ML solution TAKE 45 MLS BY MOUTH TWICE DAILY AS NEEDED FOR MILD CONSTIPATION.  . NONFORMULARY OR COMPOUNDED ITEM  [place] compound:  Authorized Substitiute Pain Cream - Ibuprofen 15%, Baclofen 1%, Gabapentin 3%, Lidocaine 2%, dispense 120 grams, apply 1-2 grams to affected area 3-4 times daily, +3Refills.  Marland Kitchen omeprazole (PRILOSEC) 20 MG capsule TAKE 1 CAPSULE(20 MG) BY MOUTH DAILY  . ONE TOUCH ULTRA TEST test strip USE TWICE DAILY TO CHECK BLOOD SUGAR AS DIRECTED  . sodium chloride (OCEAN) 0.65 % SOLN nasal spray Place 1 spray into both nostrils as needed for congestion.  Marland Kitchen SPIRIVA RESPIMAT 2.5 MCG/ACT AERS Inhale 2.5 mcg into the lungs daily as  needed.  Marland Kitchen spironolactone (ALDACTONE) 50 MG tablet TAKE 2 TABLETS BY MOUTH DAILY FOR A TOTAL OF 100 MG  . Tiotropium Bromide Monohydrate (SPIRIVA RESPIMAT) 1.25 MCG/ACT AERS Inhale 2 puffs into the lungs daily.  . rifaximin (XIFAXAN) 550 MG TABS tablet Take 1 tablet (550 mg total) by mouth 2 (two) times daily.  . [DISCONTINUED] doxycycline (VIBRA-TABS) 100 MG tablet Take 1 tablet (100 mg total) by mouth daily.  . [DISCONTINUED] ranitidine (ZANTAC) 150 MG tablet Take 1 tablet (150 mg total) by mouth 2 (two) times daily.   No facility-administered encounter medications on file as of 07/08/2016.    Allergies  Allergen Reactions  . Citalopram     Irregular heart beat  . Erythromycin     Stomach cramps  . Glimepiride     Elevated ammonia levels  . Prednisone     Increased blood sugars too high  . Versed [Midazolam] Other (See Comments)    Patient stayed confusion stayed 4+days    Patient Active Problem List   Diagnosis Date Noted  . Preventative health care 03/08/2016  . Muscle spasm 02/25/2016  . Chronic respiratory failure ([place]) 08/29/2015  . NASH (nonalcoholic steatohepatitis) 08/26/2015  . Type 2 diabetes mellitus with hyperglycemia, with long-term current use of insulin ([place])   . Liver cirrhosis secondary to NASH ([place]) 07/31/2015  . Diarrhea 12/30/2014  . Increased ammonia level 11/25/2014  . Diabetes mellitus type 2, controlled ([place]) 10/16/2014  . Superficial bruising 10/04/2014  . Encephalopathy, hepatic ([place]) 06/07/2014  . Knee pain, right 06/07/2014  . Peripheral edema 03/30/2014  . Sun-damaged skin 02/01/2014  . Anxiety and depression 02/01/2014  . Tobacco abuse disorder 02/01/2014  . Medicare annual wellness visit, subsequent 02/01/2014  . Pedal edema 12/10/2013  . Overactive bladder 12/10/2013  . Abdominal aortic aneurysm ([place]) 10/01/2013  . Arthritis of both knees 10/01/2013  . Benign paroxysmal positional vertigo 10/01/2013  . Neck pain 10/01/2013  . Esophageal  reflux 10/01/2013  . Thrombocytopenia ([place]) 03/17/2012  . 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   . Hyperlipidemia, mixed   . Breast cancer [facility])    Social History   Social History  . Marital status: Single    Spouse name: N/A  . Number of children: 0  . Years of education: N/A   Occupational History  . retired Retired   Social History Main Topics  . Smoking status: Current Some Day Smoker    Packs/day: 0.50    Years: 58.00    Types: Cigarettes    Start date: 07/27/1964  . Smokeless tobacco: Never Used     Comment: 1 pack per week, tobacco infor given 12/30/15  . Alcohol use No  . Drug use: No  . Sexual activity: No   Other Topics Concern  . Not on file   Social History Narrative   Lives alone, continues to smoke, no dietary restrictions    Ms. Holthus  family history includes Alcohol abuse in her maternal uncle; Arthritis (age of onset: 30) in her father and mother; Asthma in her maternal aunt; Birth defects in her maternal aunt; Breast cancer in her maternal aunt; COPD in her father; Diabetes in her mother and sister; Heart failure in her father; Hyperlipidemia in her mother; Hypertension in her mother; Stroke in her mother.      Objective:    Vitals:   07/08/16 1501  BP: (!) 110/58  Pulse: 88    Physical Exam   well-developed elderly white female in no acute distress, pleasant blood pressure 110/58 pulse 88, height 5 foot 7 weight 156 BMI of 24.4. HEENT nontraumatic normocephalic EOMI PERRLA sclera anicteric, +fetor hepaticus , Cardiovascular; regular rate and rhythm with S1-S2 no murmur or gallop, Pulmonary; clear bilaterally, Abdomen;oft, no appreciable fluid wave nontender liver and spleen both enlarged, no palpable mass bowel sounds present, Rectal ;exam not done, Extremities ;trace edema in the ankles, Neuropsych; mood and affect appropriate she is mentating well ,she is a bit tremulous and has asterixis.       Assessment &  Plan:   #6 75 year old white female with NASH cirrhosis and history of hepatic encephalopathy who presents stating that lactulose is not working to cause loose bowel movements. She has been on 30 mL 3 times a day. Venous ammonia on 06/29/2016 was 226. She is not overtly encephalopathic today mentating well but is tremulous and has asterixis #2 COPD, chronic bronchitis #3 adult-onset diabetes mellitus #4 history of breast cancer #5 history of grade 1 esophageal varices last EGD December 2016, patient is scheduled for upcoming follow-up EGD  Plan; we will increase lactulose to 90 mL by mouth 3 times daily over the next 2 days until she is having diarrhea, to purge her bowel and then longer-term have asked her to increase lactulose to 60 mL by mouth 3 times daily Start Xifaxan 550 mg by mouth twice a day, she was given samples today until we can get prescription authorized Will have patient come back for labs early next week to include BMET and venous ammonia. She is asked to call in 48 hours if larger doses of lactulose are not producing significant diarrhea.   Alizae Bechtel Genia Harold PA-C 07/08/2016   Cc: Mosie Lukes, MD

## 2016-07-09 ENCOUNTER — Encounter (HOSPITAL_COMMUNITY): Payer: PPO

## 2016-07-09 ENCOUNTER — Telehealth: Payer: Self-pay | Admitting: Physician Assistant

## 2016-07-09 NOTE — Progress Notes (Signed)
Reviewed and agree with documentation and assessment and plan. K. Veena Mackay Hanauer , MD   

## 2016-07-10 NOTE — Telephone Encounter (Signed)
Faxed the office notes , dated 12-13 to [facility], to [place].

## 2016-07-13 DIAGNOSIS — M1711 Unilateral primary osteoarthritis, right knee: Secondary | ICD-10-CM | POA: Diagnosis not present

## 2016-07-13 DIAGNOSIS — M25561 Pain in right knee: Secondary | ICD-10-CM | POA: Diagnosis not present

## 2016-07-14 ENCOUNTER — Encounter (HOSPITAL_COMMUNITY)
Admission: RE | Admit: 2016-07-14 | Discharge: 2016-07-14 | Disposition: A | Discharge status: Home / Self Care | Payer: PPO | Source: Ambulatory Visit | Attending: Pulmonary Disease | Admitting: Pulmonary Disease

## 2016-07-14 DIAGNOSIS — J449 Chronic obstructive pulmonary disease, unspecified: Secondary | ICD-10-CM | POA: Diagnosis not present

## 2016-07-14 DIAGNOSIS — J42 Unspecified chronic bronchitis: Secondary | ICD-10-CM

## 2016-07-14 NOTE — Progress Notes (Signed)
Daily Session Note  Patient Details  Name: Ashley Savage MRN: [id number] Date of Birth: 1940/11/18 Referring Provider:   [name] Pulmonary Rehab Walk Test from 05/07/2016 in [place]  Referring Provider  Dr. Elsworth Soho      Encounter Date: 07/14/2016  Check In:     Session Check In - 07/14/16 1617      Check-In   Location [facility]   Staff Present Su Hilt, MS, ACSM RCEP, Exercise Physiologist;Annedrea Rosezella [place], RN, MHA;Axie Hayne Rollene Rotunda, RN, BSN   Supervising physician immediately available to respond to emergencies [facility] immediately available   Physician(s) Dr. Carles Collet   Medication changes reported     No   Fall or balance concerns reported    No   Warm-up and Cool-down Performed as group-led instruction   Resistance Training Performed Yes   VAD Patient? No     Pain Assessment   Currently in Pain? No/denies   Multiple Pain Sites No      Capillary Blood Glucose: No results found for this or any previous visit (from the past 24 hour(s)).     POCT Glucose - 07/14/16 1622      POCT Blood Glucose   Pre-Exercise 110 mg/dL   Post-Exercise 115 mg/dL         Exercise Prescription Changes - 07/14/16 1544      Response to Exercise   Blood Pressure (Admit) 116/42   Blood Pressure (Exercise) 110/58   Blood Pressure (Exit) 108/62   Heart Rate (Admit) 73 bpm   Heart Rate (Exercise) 89 bpm   Heart Rate (Exit) 84 bpm   Oxygen Saturation (Admit) 92 %   Oxygen Saturation (Exercise) 90 %   Oxygen Saturation (Exit) 93 %   Rating of Perceived Exertion (Exercise) 13   Perceived Dyspnea (Exercise) 1   Duration Progress to 45 minutes of aerobic exercise without signs/symptoms of physical distress   Intensity THRR unchanged     Progression   Progression Continue to progress workloads to maintain intensity without signs/symptoms of physical distress.     Resistance Training   Training Prescription Yes   Weight green bands   Reps 10-12  10 minutes of strength training     Interval Training   Interval Training No     NuStep   Level 4   Minutes 17   METs 1.5     Track   Laps 14   Minutes 34     Goals Met:  Exercise tolerated well No report of cardiac concerns or symptoms Strength training completed today  Goals Unmet:  Not Applicable  Comments: Service time is from 1330 to 1500   Dr. Rush Farmer is Medical Director for Pulmonary Rehab at [facility].

## 2016-07-15 ENCOUNTER — Telehealth: Payer: Self-pay | Admitting: *Deleted

## 2016-07-15 NOTE — Telephone Encounter (Signed)
Faxed the office notes from Nicoletta Ba PA from 12 14 2017 to [facility], [place] , on 07-10-2016.

## 2016-07-16 ENCOUNTER — Encounter (HOSPITAL_COMMUNITY)
Admission: RE | Admit: 2016-07-16 | Discharge: 2016-07-16 | Disposition: A | Discharge status: Home / Self Care | Payer: PPO | Source: Ambulatory Visit | Attending: Pulmonary Disease | Admitting: Pulmonary Disease

## 2016-07-16 VITALS — Wt 160.3 lb

## 2016-07-16 DIAGNOSIS — J42 Unspecified chronic bronchitis: Secondary | ICD-10-CM

## 2016-07-16 DIAGNOSIS — J449 Chronic obstructive pulmonary disease, unspecified: Secondary | ICD-10-CM | POA: Diagnosis not present

## 2016-07-16 NOTE — Progress Notes (Signed)
Daily Session Note  Patient Details  Name: Ashley Savage MRN: [id number] Date of Birth: 05/18/1941 Referring Provider:   [name] Pulmonary Rehab Walk Test from 05/07/2016 in [place]  Referring Provider  Dr. Elsworth Soho      Encounter Date: 07/16/2016  Check In:     Session Check In - 07/16/16 1339      Check-In   Location [facility]   Staff Present Su Hilt, MS, ACSM RCEP, Exercise Physiologist;Joan Leonia Reeves, RN, Luisa Hart, RN, BSN   Supervising physician immediately available to respond to emergencies [facility] immediately available   Physician(s) Dr. Ree Kida   Medication changes reported     No   Fall or balance concerns reported    No   Warm-up and Cool-down Performed as group-led instruction   Resistance Training Performed Yes   VAD Patient? No     Pain Assessment   Currently in Pain? No/denies   Multiple Pain Sites No      Capillary Blood Glucose: No results found for this or any previous visit (from the past 24 hour(s)).      Exercise Prescription Changes - 07/16/16 1500      Response to Exercise   Blood Pressure (Admit) 104/50   Blood Pressure (Exercise) 132/70   Blood Pressure (Exit) 108/56   Heart Rate (Admit) 74 bpm   Heart Rate (Exercise) 93 bpm   Heart Rate (Exit) 75 bpm   Oxygen Saturation (Admit) 92 %   Oxygen Saturation (Exercise) 92 %   Oxygen Saturation (Exit) 97 %   Rating of Perceived Exertion (Exercise) 11   Perceived Dyspnea (Exercise) 1   Duration Progress to 45 minutes of aerobic exercise without signs/symptoms of physical distress   Intensity THRR unchanged     Progression   Progression Continue to progress workloads to maintain intensity without signs/symptoms of physical distress.     Resistance Training   Training Prescription Yes   Weight green bands   Reps 10-12  10 minutes of strength training     Interval Training   Interval Training No     NuStep   Level 4   Minutes 17   METs 1.6     Track   Laps 8   Minutes 17     Goals Met:  Exercise tolerated well No report of cardiac concerns or symptoms Strength training completed today  Goals Unmet:  Not Applicable  Comments: Service time is from 1:30p to 3:15p    Dr. Rush Farmer is Medical Director for Pulmonary Rehab at [facility].

## 2016-07-20 ENCOUNTER — Encounter: Payer: Self-pay | Admitting: Physician Assistant

## 2016-07-20 ENCOUNTER — Encounter: Payer: Self-pay | Admitting: Hematology & Oncology

## 2016-07-20 ENCOUNTER — Encounter: Payer: Self-pay | Admitting: Adult Health

## 2016-07-21 ENCOUNTER — Encounter (HOSPITAL_COMMUNITY)
Admission: RE | Admit: 2016-07-21 | Discharge: 2016-07-21 | Disposition: A | Discharge status: Home / Self Care | Payer: PPO | Source: Ambulatory Visit | Attending: Pulmonary Disease | Admitting: Pulmonary Disease

## 2016-07-21 ENCOUNTER — Other Ambulatory Visit: Payer: Self-pay | Admitting: Family

## 2016-07-21 NOTE — Progress Notes (Signed)
Pulmonary Individual Treatment Plan  Patient Details  Name: Ashley Savage MRN: [id number] Date of Birth: 1941-06-13 Referring Provider:   [name] Pulmonary Rehab Walk Test from 05/07/2016 in [place]  Referring Provider  Dr. Elsworth Soho      Initial Encounter Date:  Flowsheet Row Pulmonary Rehab Walk Test from 05/07/2016 in [place]  Date  05/07/16  Referring Provider  Dr. Elsworth Soho      Visit Diagnosis: Chronic bronchitis, unspecified chronic bronchitis type ([place])  Patient's Home Medications on Admission:   Current Outpatient Prescriptions:  .  albuterol (ACCUNEB) 1.25 MG/3ML nebulizer solution, Take 1 ampule by nebulization as needed for wheezing., Disp: , Rfl:  .  albuterol (PROVENTIL HFA;VENTOLIN HFA) 108 (90 Base) MCG/ACT inhaler, Inhale 2 puffs into the lungs every 6 (six) hours as needed for wheezing or shortness of breath. Only dispense Ventolin, Disp: 1 Inhaler, Rfl: 3 .  BD PEN NEEDLE NANO U/F 32G X 4 MM MISC, USE AS DIRECTED WITH LEVEMIR FLEXPEN, Disp: 100 each, Rfl: 0 .  cetirizine (ZYRTEC) 10 MG tablet, Take 10 mg by mouth daily as needed. , Disp: , Rfl:  .  Cholecalciferol (VITAMIN D3) 2000 units TABS, Take 2,000 Units by mouth every morning., Disp: , Rfl:  .  furosemide (LASIX) 20 MG tablet, TAKE 1 TABLET BY MOUTH TWICE DAILY, Disp: 60 tablet, Rfl: 0 .  guaiFENesin (MUCINEX) 600 MG 12 hr tablet, Take 1 tablet (600 mg total) by mouth 2 (two) times daily., Disp: 40 tablet, Rfl: 0 .  insulin aspart (NOVOLOG) 100 UNIT/ML injection, 4 units SQ q lunch daily and prn Sliding scale:  BS <200 no units BS 201-250 use 2 units BS 251-300 use 4 units BS 301-350 use 6 units BS 351-400 use 8 units BS 401-450 use 10 units BS>451 call MD, Disp: 10 mL, Rfl: 3 .  Insulin Detemir (LEVEMIR FLEXPEN) 100 UNIT/ML Pen, Inject 14 Units into the skin daily at 10 pm., Disp: 15 mL, Rfl: 5 .  Insulin Syringe-Needle U-100 31G X 5/16" 1 ML  MISC, To use w/ Novolog, Disp: 100 each, Rfl: 12 .  lactulose (CHRONULAC) 10 GM/15ML solution, TAKE 45 MLS BY MOUTH TWICE DAILY AS NEEDED FOR MILD CONSTIPATION., Disp: 1892 mL, Rfl: 6 .  NONFORMULARY OR COMPOUNDED ITEM, [place] compound:  Authorized Substitiute Pain Cream - Ibuprofen 15%, Baclofen 1%, Gabapentin 3%, Lidocaine 2%, dispense 120 grams, apply 1-2 grams to affected area 3-4 times daily, +3Refills., Disp: 120 each, Rfl: 3 .  omeprazole (PRILOSEC) 20 MG capsule, TAKE 1 CAPSULE(20 MG) BY MOUTH DAILY, Disp: 90 capsule, Rfl: 3 .  ONE TOUCH ULTRA TEST test strip, USE TWICE DAILY TO CHECK BLOOD SUGAR AS DIRECTED, Disp: 100 each, Rfl: 4 .  rifaximin (XIFAXAN) 550 MG TABS tablet, Take 1 tablet (550 mg total) by mouth 2 (two) times daily., Disp: 60 tablet, Rfl: 11 .  sodium chloride (OCEAN) 0.65 % SOLN nasal spray, Place 1 spray into both nostrils as needed for congestion., Disp: 30 mL, Rfl: 0 .  SPIRIVA RESPIMAT 2.5 MCG/ACT AERS, Inhale 2.5 mcg into the lungs daily as needed., Disp: 3 Inhaler, Rfl: 3 .  spironolactone (ALDACTONE) 50 MG tablet, TAKE 2 TABLETS BY MOUTH DAILY FOR A TOTAL OF 100 MG, Disp: 60 tablet, Rfl: 0 .  Tiotropium Bromide Monohydrate (SPIRIVA RESPIMAT) 1.25 MCG/ACT AERS, Inhale 2 puffs into the lungs daily., Disp: 2 Inhaler, Rfl: 0  Past Medical History: Past Medical History:  Diagnosis Date  . Anxiety   . Arthritis of both knees 10/01/2013  . Benign paroxysmal positional vertigo 10/01/2013  . Cancer [facility]) breast ca  right  . COPD (chronic obstructive pulmonary disease) ([place]) 10/01/2013  . Depression   . Diabetes mellitus type 2  . Emphysema   . Encephalopathy, hepatic ([place]) 06/07/2014  . Esophageal reflux 10/01/2013  . Hyperlipidemia   . Hyperlipidemia, mixed   . Increased ammonia level 11/25/2014  . NASH (nonalcoholic steatohepatitis) 08/26/2015  . Neck pain 10/01/2013  . Neuropathy (HCC)    feet   . Overactive bladder 12/10/2013  . Panic attacks   . Pedal edema  12/10/2013  . Preventative health care 03/08/2016  . Tobacco abuse disorder 02/01/2014    Tobacco Use: History  Smoking Status  . Current Some Day Smoker  . Packs/day: 0.50  . Years: 58.00  . Types: Cigarettes  . Start date: 07/27/1964  Smokeless Tobacco  . Never Used    Comment: 1 pack per week, tobacco infor given 12/30/15    Labs: Recent Review Flowsheet Data    Labs for ITP Cardiac and Pulmonary Rehab Latest Ref Rng & Units 10/15/2014 03/26/2015 07/03/2015 08/26/2015 02/25/2016   Cholestrol 0 - 200 mg/dL - 151 - 157 187   LDLCALC 0 - 99 mg/dL - 93 - 107(H) 108(H)   HDL >39.00 mg/dL - 42.90 - 38.10(L) 55.80   Trlycerides 0.0 - 149.0 mg/dL - 74.0 - 58.0 112.0   Hemoglobin A1c 4.6 - 6.5 % - 6.6(H) 6.5 - 6.3   PHART 7.350 - 7.450 7.426 - - - -   PCO2ART 35.0 - 45.0 mmHg 37.2 - - - -   HCO3 20.0 - 24.0 mEq/L 24.5(H) - - - -   TCO2 0 - 100 mmol/L 26 - - - -   O2SAT % 95.0 - - - -      Capillary Blood Glucose: Lab Results  Component Value Date   GLUCAP 150 (H) 08/01/2015   GLUCAP 240 (H) 08/01/2015   GLUCAP 154 (H) 08/01/2015   GLUCAP 162 (H) 08/01/2015   GLUCAP 185 (H) 08/01/2015       POCT Glucose    Row Name 05/14/16 1612 05/19/16 1547 05/21/16 1633 05/26/16 1608 05/28/16 1650     POCT Blood Glucose   Pre-Exercise 173 mg/dL 192 mg/dL 220 mg/dL 171 mg/dL 192 mg/dL   Post-Exercise 142 mg/dL  - 140 mg/dL 131 mg/dL  -   Row Name 06/02/16 1550 06/04/16 1616 06/11/16 1610 06/16/16 1515 06/23/16 1608     POCT Blood Glucose   Pre-Exercise 179 mg/dL 179 mg/dL 120 mg/dL 208 mg/dL 178 mg/dL   Post-Exercise 127 mg/dL 135 mg/dL 167 mg/dL 115 mg/dL 127 mg/dL   Row Name 07/07/16 1542 07/14/16 1622           POCT Blood Glucose   Pre-Exercise 296 mg/dL 110 mg/dL      Post-Exercise 137 mg/dL 115 mg/dL         ADL UCSD:   Pulmonary Function Assessment:     Pulmonary Function Assessment - 05/04/16 1047      Post Bronchodilator Spirometry Results   FVC% 1.85 %   FEV1% 40 %    FEV1/FVC Ratio 54      Exercise Target Goals:    Exercise Program Goal: Individual exercise prescription set with THRR, safety & activity barriers. Participant demonstrates ability to understand and report RPE using BORG scale, to self-measure pulse accurately, and to acknowledge the importance of the  exercise prescription.  Exercise Prescription Goal: Starting with aerobic activity 30 plus minutes a day, 3 days per week for initial exercise prescription. Provide home exercise prescription and guidelines that participant acknowledges understanding prior to discharge.  Activity Barriers & Risk Stratification:     Activity Barriers & Cardiac Risk Stratification - 05/04/16 1027      Activity Barriers & Cardiac Risk Stratification   Activity Barriers Arthritis;Deconditioning;Balance Concerns      6 Minute Walk:     6 Minute Walk    Row Name 05/07/16 1618         6 Minute Walk   Phase Initial     Distance 832 feet     Walk Time 6 minutes     # of Rest Breaks 0     MPH 1.57     METS 2.23     RPE 11     Perceived Dyspnea  1     Symptoms Yes (comment)     Comments knee pain     Resting HR 87 bpm     Resting BP 100/62     Max Ex. HR 94 bpm     Max Ex. BP 130/60       Interval HR   Baseline HR 87     1 Minute HR 88     2 Minute HR 94     3 Minute HR 94     4 Minute HR 90     5 Minute HR 92     6 Minute HR 91     2 Minute Post HR 90     Interval Heart Rate? Yes       Interval Oxygen   Interval Oxygen? Yes     Baseline Oxygen Saturation % 94 %     Baseline Liters of Oxygen 9 L     1 Minute Oxygen Saturation % 94 %     1 Minute Liters of Oxygen 0 L     2 Minute Oxygen Saturation % 92 %     2 Minute Liters of Oxygen 0 L     3 Minute Oxygen Saturation % 92 %     3 Minute Liters of Oxygen 0 L     4 Minute Oxygen Saturation % 94 %     4 Minute Liters of Oxygen 0 L     5 Minute Oxygen Saturation % 95 %     5 Minute Liters of Oxygen 0 L     6 Minute Oxygen  Saturation % 96 %     6 Minute Liters of Oxygen 0 L     2 Minute Post Oxygen Saturation % 97 %     2 Minute Post Liters of Oxygen 0 L        Initial Exercise Prescription:     Initial Exercise Prescription - 05/07/16 1600      Date of Initial Exercise RX and Referring Provider   Date 05/07/16   Referring Provider Dr. Elsworth Soho     NuStep   Level 2   Minutes 17   METs 1.5     Arm Ergometer   Level 1   Minutes 17     Track   Laps 5   Minutes 17     Prescription Details   Frequency (times per week) 2   Duration Progress to 45 minutes of aerobic exercise without signs/symptoms of physical distress     Intensity   THRR 40-80% of Max Heartrate  10-626   Ratings of Perceived Exertion 11-13   Perceived Dyspnea 0-4     Progression   Progression Continue progressive overload as per policy without signs/symptoms or physical distress.     Resistance Training   Training Prescription Yes   Weight green bands   Reps 10-12      Perform Capillary Blood Glucose checks as needed.  Exercise Prescription Changes:     Exercise Prescription Changes    Row Name 05/14/16 1600 05/21/16 1600 05/26/16 1600 05/28/16 1647 06/02/16 1500     Exercise Review   Progression  - Yes  - Yes  -     Response to Exercise   Blood Pressure (Admit) 98/42 104/60 102/50 110/50 104/50   Blood Pressure (Exercise) 136/66 110/64 130/50 124/70 100/60   Blood Pressure (Exit) 104/60 114/60 100/40 112/68 100/50   Heart Rate (Admit) 70 bpm 73 bpm 71 bpm 76 bpm 80 bpm   Heart Rate (Exercise) 85 bpm 79 bpm 81 bpm 92 bpm 107 bpm   Heart Rate (Exit) 80 bpm 80 bpm 75 bpm 84 bpm 89 bpm   Oxygen Saturation (Admit) 97 % 94 % 94 % 97 % 93 %   Oxygen Saturation (Exercise) 95 % 95 % 93 % 96 % 88 %   Oxygen Saturation (Exit) 95 % 91 % 91 % 84 % 93 %   Rating of Perceived Exertion (Exercise) '15 13 11 11 13   ' Perceived Dyspnea (Exercise) '2 1 2 2 1   ' Duration Progress to 45 minutes of aerobic exercise without  signs/symptoms of physical distress Progress to 45 minutes of aerobic exercise without signs/symptoms of physical distress Progress to 45 minutes of aerobic exercise without signs/symptoms of physical distress Progress to 45 minutes of aerobic exercise without signs/symptoms of physical distress Progress to 45 minutes of aerobic exercise without signs/symptoms of physical distress   Intensity -  40-80% HRR THRR unchanged THRR unchanged THRR unchanged THRR unchanged     Progression   Progression  - Continue to progress workloads to maintain intensity without signs/symptoms of physical distress. Continue to progress workloads to maintain intensity without signs/symptoms of physical distress. Continue to progress workloads to maintain intensity without signs/symptoms of physical distress. Continue to progress workloads to maintain intensity without signs/symptoms of physical distress.     Resistance Training   Training Prescription Yes Yes Yes Yes Yes   Weight green bands  10 min green bands green bands green bands green bands   Reps 10-12 10-12  10 minutes of strength training 10-12  10 minutes of strength training 10-12  10 minutes of strength training 10-12  10 minutes of strength training     Interval Training   Interval Training  - No No No No     Oxygen   Oxygen -  room air  -  -  -  -     NuStep   Level  - 2  - 3 3   Minutes  - '17 17 17 17   ' METs  - 1.3 1.7 2.4 1.5     Arm Ergometer   Level '1 1 1  ' - 1   Minutes '17 17 17  ' - 17     Track   Laps 8  - '8 9 9   ' Minutes 17  - '17 17 17   ' Row Name 06/04/16 1613 06/09/16 1500 06/11/16 1608 06/16/16 1513 06/23/16 1600     Response to Exercise   Blood Pressure (Admit) 110/52 120/50  102/50 121/67 115/63   Blood Pressure (Exercise) 110/62 104/60 120/52 114/70 98/42   Blood Pressure (Exit) 104/60 120/60 118/80 100/62 96/50   Heart Rate (Admit) 76 bpm 79 bpm 85 bpm 74 bpm 83 bpm   Heart Rate (Exercise) 94 bpm 99 bpm 98 bpm 94 bpm 76  bpm   Heart Rate (Exit) 84 bpm 55 bpm 82 bpm 77 bpm 73 bpm   Oxygen Saturation (Admit) 94 % 95 % 91 % 98 % 96 %   Oxygen Saturation (Exercise) 89 % 94 % 91 % 93 % 94 %   Oxygen Saturation (Exit) 95 % 91 % 82 % 95 % 95 %   Rating of Perceived Exertion (Exercise) '13 13 11 15 11   ' Perceived Dyspnea (Exercise) '1 3 1 1 1   ' Duration Progress to 45 minutes of aerobic exercise without signs/symptoms of physical distress Progress to 45 minutes of aerobic exercise without signs/symptoms of physical distress Progress to 45 minutes of aerobic exercise without signs/symptoms of physical distress Progress to 45 minutes of aerobic exercise without signs/symptoms of physical distress Progress to 45 minutes of aerobic exercise without signs/symptoms of physical distress   Intensity THRR unchanged THRR unchanged THRR unchanged THRR unchanged THRR unchanged     Progression   Progression Continue to progress workloads to maintain intensity without signs/symptoms of physical distress. Continue to progress workloads to maintain intensity without signs/symptoms of physical distress. Continue to progress workloads to maintain intensity without signs/symptoms of physical distress. Continue to progress workloads to maintain intensity without signs/symptoms of physical distress. Continue to progress workloads to maintain intensity without signs/symptoms of physical distress.     Resistance Training   Training Prescription Yes Yes Yes Yes Yes   Weight green bands green bands green bands green bands green bands   Reps 10-12  10 minutes of strength training 10-12  10 minutes of strength training 10-12  10 minutes of strength training 10-12  10 minutes of strength training 10-12  10 minutes of strength training     Interval Training   Interval Training No No No No No     NuStep   Level '3 3 3 3 3   ' Minutes '17 17 17 17 17   ' METs  - 1.5 1.5 1.5 -     Arm Ergometer   Level 1 1 - 1 1   Minutes 17 17 - 17 17     Track    Laps  - '7 8 10 9   ' Minutes  - '17 17 17 17   ' Row Name 06/30/16 1550 07/02/16 1500 07/07/16 1500 07/14/16 1544 07/16/16 1500     Response to Exercise   Blood Pressure (Admit) 104/60 122/64 110/50 116/42 104/50   Blood Pressure (Exercise) 110/60 140/68 106/56 110/58 132/70   Blood Pressure (Exit) 124/56 118/70 114/64 108/62 108/56   Heart Rate (Admit) 80 bpm 77 bpm 83 bpm 73 bpm 74 bpm   Heart Rate (Exercise) 93 bpm 95 bpm 97 bpm 89 bpm 93 bpm   Heart Rate (Exit) 82 bpm 82 bpm 84 bpm 84 bpm 75 bpm   Oxygen Saturation (Admit) 93 % 94 % 94 % 92 % 92 %   Oxygen Saturation (Exercise) 93 % 96 % 91 % 90 % 92 %   Oxygen Saturation (Exit) 96 % 95 % 91 % 93 % 97 %   Rating of Perceived Exertion (Exercise) '11 11 11 13 11   ' Perceived Dyspnea (Exercise) '1 1 1 1 ' 1  Duration Progress to 45 minutes of aerobic exercise without signs/symptoms of physical distress Progress to 45 minutes of aerobic exercise without signs/symptoms of physical distress Progress to 45 minutes of aerobic exercise without signs/symptoms of physical distress Progress to 45 minutes of aerobic exercise without signs/symptoms of physical distress Progress to 45 minutes of aerobic exercise without signs/symptoms of physical distress   Intensity THRR unchanged THRR unchanged THRR unchanged THRR unchanged THRR unchanged     Progression   Progression Continue to progress workloads to maintain intensity without signs/symptoms of physical distress. Continue to progress workloads to maintain intensity without signs/symptoms of physical distress. Continue to progress workloads to maintain intensity without signs/symptoms of physical distress. Continue to progress workloads to maintain intensity without signs/symptoms of physical distress. Continue to progress workloads to maintain intensity without signs/symptoms of physical distress.     Resistance Training   Training Prescription Yes Yes Yes Yes Yes   Weight green bands green bands green  bands green bands green bands   Reps 10-12  10 minutes of strength training 10-12  10 minutes of strength training 10-12  10 minutes of strength training 10-12  10 minutes of strength training 10-12  10 minutes of strength training     Interval Training   Interval Training No No No No No     NuStep   Level 3  - '4 4 4   ' Minutes 34  - '17 17 17   ' METs 1.3  - 1.7 1.5 1.6     Arm Ergometer   Level -  -  -  -  -   Minutes -  -  -  -  -     Track   Laps '8 18 18 14 8   ' Minutes 17 34 34 34 17      Exercise Comments:     Exercise Comments    Row Name 06/01/16 1031 06/29/16 1700 07/16/16 0946       Exercise Comments Patient has difficulty with intensity increases. Has only attended 4 exercise sessions. Will cont. to monitor.  Patient has difficulty with intensity increases. Very "spacey" when she is here. Will cont. to monitor.  Patient has difficulty with intensity increases. Very "spacey" when she is here. Will cont. to monitor.         Discharge Exercise Prescription (Final Exercise Prescription Changes):     Exercise Prescription Changes - 07/16/16 1500      Response to Exercise   Blood Pressure (Admit) 104/50   Blood Pressure (Exercise) 132/70   Blood Pressure (Exit) 108/56   Heart Rate (Admit) 74 bpm   Heart Rate (Exercise) 93 bpm   Heart Rate (Exit) 75 bpm   Oxygen Saturation (Admit) 92 %   Oxygen Saturation (Exercise) 92 %   Oxygen Saturation (Exit) 97 %   Rating of Perceived Exertion (Exercise) 11   Perceived Dyspnea (Exercise) 1   Duration Progress to 45 minutes of aerobic exercise without signs/symptoms of physical distress   Intensity THRR unchanged     Progression   Progression Continue to progress workloads to maintain intensity without signs/symptoms of physical distress.     Resistance Training   Training Prescription Yes   Weight green bands   Reps 10-12  10 minutes of strength training     Interval Training   Interval Training No     NuStep    Level 4   Minutes 17   METs 1.6     Track   Laps 8  Minutes 17       Nutrition:  Target Goals: Understanding of nutrition guidelines, daily intake of sodium <1567m, cholesterol <2051m calories 30% from fat and 7% or less from saturated fats, daily to have 5 or more servings of fruits and vegetables.  Biometrics:     Pre Biometrics - 05/04/16 1037      Pre Biometrics   Grip Strength 29 kg       Nutrition Therapy Plan and Nutrition Goals:   Nutrition Discharge: Rate Your Plate Scores:   Psychosocial: Target Goals: Acknowledge presence or absence of depression, maximize coping skills, provide positive support system. Participant is able to verbalize types and ability to use techniques and skills needed for reducing stress and depression.  Initial Review & Psychosocial Screening:     Initial Psych Review & Screening - 05/04/16 [place]  Not the best support system, but has one   Strains Intra-family strains;Illness and family care strain  sister in wheel chair, neice is helpful, but [place]     Barriers   Psychosocial barriers to participate in program There are no identifiable barriers or psychosocial needs.     Screening Interventions   Interventions Encouraged to exercise      Quality of Life Scores:   PHQ-9: Recent Review Flowsheet Data    [facility] 05/04/2016 02/25/2016 02/08/2015 02/08/2015 12/26/2014   Decreased Interest 0 0 - 0 0   Down, Depressed, Hopeless 0 0 1 - 0   PHQ - 2 Score 0 0 1 0 0      Psychosocial Evaluation and Intervention:     Psychosocial Evaluation - 05/04/16 1044      Psychosocial Evaluation & Interventions   Interventions Encouraged to exercise with the program and follow exercise prescription   Continued Psychosocial Services Needed No      Psychosocial Re-Evaluation:     Psychosocial Re-Evaluation    Row Name 05/26/16 1142  05/26/16 1143 06/22/16 1258 07/21/16 0844       Psychosocial Re-Evaluation   Interventions Encouraged to attend Pulmonary Rehabilitation for the exercise  - Encouraged to attend Pulmonary Rehabilitation for the exercise Encouraged to attend Pulmonary Rehabilitation for the exercise    Comments  - No psychosocial issues identified at this time No psychosocial issues identified at this time. No psychosocial issues identified.    Continued Psychosocial Services Needed  - No No No      Education: Education Goals: Education classes will be provided on a weekly basis, covering required topics. Participant will state understanding/return demonstration of topics presented.  Learning Barriers/Preferences:     Learning Barriers/Preferences - 05/04/16 1031      Learning Barriers/Preferences   Learning Barriers None   Learning Preferences Written Material;Video;Verbal Instruction;Skilled Demonstration;Pictoral;Individual Instruction;Group Instruction;Computer/Internet;Audio      Education Topics: Risk Factor Reduction:  -Group instruction that is supported by a PowerPoint presentation. Instructor discusses the definition of a risk factor, different risk factors for pulmonary disease, and how the heart and lungs work together.     Nutrition for Pulmonary Patient:  -Group instruction provided by PowerPoint slides, verbal discussion, and written materials to support subject matter. The instructor gives an explanation and review of healthy diet recommendations, which includes a discussion on weight management, recommendations for fruit and vegetable consumption, as well as protein, fluid, caffeine, fiber, sodium, sugar, and alcohol. Tips for eating when patients are short of  breath are discussed. Flowsheet Row PULMONARY REHAB CHRONIC OBSTRUCTIVE PULMONARY DISEASE from 07/16/2016 in [place]  Date  06/04/16 [facility] Eating During the [place]  Educator  RD   Instruction Review Code  2- meets goals/outcomes      Pursed Lip Breathing:  -Group instruction that is supported by demonstration and informational handouts. Instructor discusses the benefits of pursed lip and diaphragmatic breathing and detailed demonstration on how to preform both.     Oxygen Safety:  -Group instruction provided by PowerPoint, verbal discussion, and written material to support subject matter. There is an overview of "What is Oxygen" and "Why do we need it".  Instructor also reviews how to create a safe environment for oxygen use, the importance of using oxygen as prescribed, and the risks of noncompliance. There is a brief discussion on traveling with oxygen and resources the patient may utilize. Flowsheet Row PULMONARY REHAB CHRONIC OBSTRUCTIVE PULMONARY DISEASE from 07/16/2016 in [place]  Date  05/28/16  Educator  rn  Instruction Review Code  2- meets goals/outcomes      Oxygen Equipment:  -Group instruction provided by [employer] Staff utilizing handouts, written materials, and equipment demonstrations. Flowsheet Row PULMONARY REHAB CHRONIC OBSTRUCTIVE PULMONARY DISEASE from 07/16/2016 in [place]  Date  06/11/16  Educator  Rep  Instruction Review Code  2- meets goals/outcomes      Signs and Symptoms:  -Group instruction provided by written material and verbal discussion to support subject matter. Warning signs and symptoms of infection, stroke, and heart attack are reviewed and when to call the physician/911 reinforced. Tips for preventing the spread of infection discussed. Flowsheet Row PULMONARY REHAB CHRONIC OBSTRUCTIVE PULMONARY DISEASE from 07/16/2016 in [place]  Date  07/02/16  Educator  rn  Instruction Review Code  2- meets goals/outcomes      Advanced Directives:  -Group instruction provided by verbal instruction and written material to support  subject matter. Instructor reviews Advanced Directive laws and proper instruction for filling out document.   Pulmonary Video:  -Group video education that reviews the importance of medication and oxygen compliance, exercise, good nutrition, pulmonary hygiene, and pursed lip and diaphragmatic breathing for the pulmonary patient. Flowsheet Row PULMONARY REHAB CHRONIC OBSTRUCTIVE PULMONARY DISEASE from 07/16/2016 in [place]  Date  05/14/16  Educator  video  Instruction Review Code  2- meets goals/outcomes      Exercise for the Pulmonary Patient:  -Group instruction that is supported by a PowerPoint presentation. Instructor discusses benefits of exercise, core components of exercise, frequency, duration, and intensity of an exercise routine, importance of utilizing pulse oximetry during exercise, safety while exercising, and options of places to exercise outside of rehab.   Flowsheet Row PULMONARY REHAB CHRONIC OBSTRUCTIVE PULMONARY DISEASE from 07/16/2016 in [place]  Date  06/25/16  Educator  EP  Instruction Review Code  2- meets goals/outcomes      Pulmonary Medications:  -Verbally interactive group education provided by instructor with focus on inhaled medications and proper administration.   Anatomy and Physiology of the Respiratory System and Intimacy:  -Group instruction provided by PowerPoint, verbal discussion, and written material to support subject matter. Instructor reviews respiratory cycle and anatomical components of the respiratory system and their functions. Instructor also reviews differences in obstructive and restrictive respiratory diseases with examples of each. Intimacy, Sex, and Sexuality differences are reviewed with a  discussion on how relationships can change when diagnosed with pulmonary disease. Common sexual concerns are reviewed.   Knowledge Questionnaire Score:   Core Components/Risk  Factors/Patient Goals at Admission:     Personal Goals and Risk Factors at Admission - 05/04/16 1038      Core Components/Risk Factors/Patient Goals on Admission   Increase Strength and Stamina Yes   Intervention Provide advice, education, support and counseling about physical activity/exercise needs.;Develop an individualized exercise prescription for aerobic and resistive training based on initial evaluation findings, risk stratification, comorbidities and participant's personal goals.   Expected Outcomes Achievement of increased cardiorespiratory fitness and enhanced flexibility, muscular endurance and strength shown through measurements of functional capacity and personal statement of participant.   Tobacco Cessation Yes   Number of packs per day 0-2 cigarettes/day   Intervention Assist the participant in steps to quit. Provide individualized education and counseling about committing to Tobacco Cessation, relapse prevention, and pharmacological support that can be provided by physician.;Advice worker, assist with locating and accessing local/national Quit Smoking programs, and support quit date choice.   Expected Outcomes Short Term: Will quit all tobacco product use, adhering to prevention of relapse plan.   Improve shortness of breath with ADL's Yes   Intervention Provide education, individualized exercise plan and daily activity instruction to help decrease symptoms of SOB with activities of daily living.   Expected Outcomes Short Term: Achieves a reduction of symptoms when performing activities of daily living.   Develop more efficient breathing techniques such as purse lipped breathing and diaphragmatic breathing; and practicing self-pacing with activity Yes   Intervention Provide education, demonstration and support about specific breathing techniuqes utilized for more efficient breathing. Include techniques such as pursed lipped breathing, diaphragmatic breathing and  self-pacing activity.   Expected Outcomes Short Term: Participant will be able to demonstrate and use breathing techniques as needed throughout daily activities.   Increase knowledge of respiratory medications and ability to use respiratory devices properly  Yes      Core Components/Risk Factors/Patient Goals Review:      Goals and Risk Factor Review    Row Name 05/26/16 1139 06/22/16 1255 07/21/16 0842         Core Components/Risk Factors/Patient Goals Review   Personal Goals Review Tobacco Cessation;Increase knowledge of respiratory medications and ability to use respiratory devices properly.;Improve shortness of breath with ADL's;Develop more efficient breathing techniques such as purse lipped breathing and diaphragmatic breathing and practicing self-pacing with activity.;Increase Strength and Stamina Tobacco Cessation;Increase knowledge of respiratory medications and ability to use respiratory devices properly.;Improve shortness of breath with ADL's;Develop more efficient breathing techniques such as purse lipped breathing and diaphragmatic breathing and practicing self-pacing with activity.;Increase Strength and Stamina Tobacco Cessation;Increase knowledge of respiratory medications and ability to use respiratory devices properly.;Develop more efficient breathing techniques such as purse lipped breathing and diaphragmatic breathing and practicing self-pacing with activity.;Increase Strength and Stamina     Review Has attended 3 exercise sessions, too early to see any progress towards goals.  Is not ready to quit smoking at this time, support given. Still is not ready to quit cigarettes, walking 10 LAPS ON TRACK, NUSTEP LEVEL 3, arm ergometer level 2. Slow progress, level 4 on nustep.     Expected Outcomes Expect to see an increase in strength and stamina in the next 30 days Try to encourage smoking cessation in hopes she will move closer to deciding to quit.  Expect strength to increase.  Progress levels as tolerated.  Core Components/Risk Factors/Patient Goals at Discharge (Final Review):      Goals and Risk Factor Review - 07/21/16 0842      Core Components/Risk Factors/Patient Goals Review   Personal Goals Review Tobacco Cessation;Increase knowledge of respiratory medications and ability to use respiratory devices properly.;Develop more efficient breathing techniques such as purse lipped breathing and diaphragmatic breathing and practicing self-pacing with activity.;Increase Strength and Stamina   Review Slow progress, level 4 on nustep.   Expected Outcomes Progress levels as tolerated.      ITP Comments:   Comments: ITP REVIEW Pt is making expected progress toward pulmonary rehab goals after completing 15 sessions. Recommend continued exercise, life style modification, education, and utilization of breathing techniques to increase stamina and strength and decrease shortness of breath with exertion.

## 2016-07-23 ENCOUNTER — Encounter (HOSPITAL_COMMUNITY)
Admission: RE | Admit: 2016-07-23 | Discharge: 2016-07-23 | Disposition: A | Discharge status: Home / Self Care | Payer: PPO | Source: Ambulatory Visit | Attending: Pulmonary Disease | Admitting: Pulmonary Disease

## 2016-07-23 ENCOUNTER — Encounter: Payer: Self-pay | Admitting: Family Medicine

## 2016-07-23 DIAGNOSIS — J42 Unspecified chronic bronchitis: Secondary | ICD-10-CM

## 2016-07-24 ENCOUNTER — Ambulatory Visit (HOSPITAL_BASED_OUTPATIENT_CLINIC_OR_DEPARTMENT_OTHER)
Admission: RE | Admit: 2016-07-24 | Discharge: 2016-07-24 | Disposition: A | Discharge status: Home / Self Care | Payer: PPO | Source: Ambulatory Visit | Attending: Family Medicine | Admitting: Family Medicine

## 2016-07-24 ENCOUNTER — Encounter: Payer: Self-pay | Admitting: Family Medicine

## 2016-07-24 ENCOUNTER — Other Ambulatory Visit (INDEPENDENT_AMBULATORY_CARE_PROVIDER_SITE_OTHER): Payer: PPO

## 2016-07-24 ENCOUNTER — Ambulatory Visit (INDEPENDENT_AMBULATORY_CARE_PROVIDER_SITE_OTHER): Payer: PPO | Admitting: Family Medicine

## 2016-07-24 VITALS — BP 128/66 | HR 82 | Temp 98.0°F | Wt 159.4 lb

## 2016-07-24 DIAGNOSIS — M25551 Pain in right hip: Secondary | ICD-10-CM | POA: Diagnosis not present

## 2016-07-24 DIAGNOSIS — K7460 Unspecified cirrhosis of liver: Secondary | ICD-10-CM | POA: Diagnosis not present

## 2016-07-24 DIAGNOSIS — Z794 Long term (current) use of insulin: Secondary | ICD-10-CM

## 2016-07-24 DIAGNOSIS — E1165 Type 2 diabetes mellitus with hyperglycemia: Secondary | ICD-10-CM | POA: Diagnosis not present

## 2016-07-24 DIAGNOSIS — K7682 Hepatic encephalopathy: Secondary | ICD-10-CM

## 2016-07-24 DIAGNOSIS — W19XXXA Unspecified fall, initial encounter: Secondary | ICD-10-CM | POA: Diagnosis not present

## 2016-07-24 DIAGNOSIS — K7290 Hepatic failure, unspecified without coma: Secondary | ICD-10-CM | POA: Diagnosis not present

## 2016-07-24 DIAGNOSIS — Z720 Tobacco use: Secondary | ICD-10-CM

## 2016-07-24 DIAGNOSIS — S79911A Unspecified injury of right hip, initial encounter: Secondary | ICD-10-CM | POA: Diagnosis not present

## 2016-07-24 LAB — BASIC METABOLIC PANEL
BUN: 22 mg/dL (ref 6–23)
CHLORIDE: 105 meq/L (ref 96–112)
CO2: 29 mEq/L (ref 19–32)
Calcium: 8.9 mg/dL (ref 8.4–10.5)
Creatinine, Ser: 1.21 mg/dL — ABNORMAL HIGH (ref 0.40–1.20)
GFR: 46.32 mL/min — AB (ref >60.00)
Glucose, Bld: 225 mg/dL — ABNORMAL HIGH (ref 70–99)
POTASSIUM: 5.1 meq/L (ref 3.5–5.1)
Sodium: 140 mEq/L (ref 135–145)

## 2016-07-24 LAB — AMMONIA: AMMONIA: 90 umol/L — AB (ref 11–35)

## 2016-07-24 MED ORDER — METHYLPREDNISOLONE 4 MG PO TABS: ORAL_TABLET | ORAL | 0 refills | Status: DC

## 2016-07-24 MED ORDER — TIZANIDINE HCL 4 MG PO TABS: 4.0000 mg | ORAL_TABLET | Freq: Three times a day (TID) | ORAL | 1 refills | Status: DC | PRN

## 2016-07-24 MED ORDER — "INSULIN SYRINGE-NEEDLE U-100 31G X 5/16"" 1 ML MISC": 12 refills | Status: DC

## 2016-07-24 MED FILL — METHYLPREDNISOLONE 4 MG TAB: 4 | 5 days supply | Qty: 15 | Fill #0

## 2016-07-24 MED FILL — tiZANidine HCL 4 MG TABS: 4 | 10 days supply | Qty: 30 | Fill #0

## 2016-07-24 NOTE — Progress Notes (Signed)
Pre visit review using our clinic review tool, if applicable. No additional management support is needed unless otherwise documented below in the visit note. 

## 2016-07-24 NOTE — Progress Notes (Signed)
[name], female   DOB: 10-06-1940, 75 y.o.   MRN: [id number]   Subjective:    [name], female    DOB: 1940-11-09, 75 y.o.   MRN: [id number]  Chief Complaint  Patient presents with  . Fall    HPI Patient is in today for evaluation after a fall several days ago. She fell backwards onto her tailbone. She has persistent pain and pain is noted into right leg. No incontinence or paresthesias. No recent illness or acute new concerns but she continues to struggle with elevated ammonia levels and liver disease. She denies any episodes of mental status changes. Denies CP/palp/SOB/HA/congestion/fevers/GI or GU c/o. Taking meds as prescribed  Past Medical History:  Diagnosis Date  . Anxiety   . Arthritis of both knees 10/01/2013  . Benign paroxysmal positional vertigo 10/01/2013  . Cancer [facility]) breast ca  right  . COPD (chronic obstructive pulmonary disease) ([place]) 10/01/2013  . Depression   . Diabetes mellitus type 2  . Emphysema   . Encephalopathy, hepatic ([place]) 06/07/2014  . Esophageal reflux 10/01/2013  . Fall 07/30/2016  . Hyperlipidemia   . Hyperlipidemia, mixed   . Increased ammonia level 11/25/2014  . NASH (nonalcoholic steatohepatitis) 08/26/2015  . Neck pain 10/01/2013  . Neuropathy (HCC)    feet   . Overactive bladder 12/10/2013  . Panic attacks   . Pedal edema 12/10/2013  . Preventative health care 03/08/2016  . Tobacco abuse disorder 02/01/2014    Past Surgical History:  Procedure Laterality Date  . APPENDECTOMY  2007  . BREAST SURGERY  2009 right  . CATARACT EXTRACTION     x 2  . [place]  . KNEE SURGERY    . MANDIBLE FRACTURE SURGERY    . PILONIDAL CYST EXCISION    . TONSILLECTOMY      Family History  Problem Relation Age of Onset  . Heart failure Father   . COPD Father   . Arthritis Father 39  . Stroke Mother   . Arthritis Mother 54  . Hyperlipidemia Mother   . Hypertension Mother   . Diabetes Mother   . Diabetes Sister    . Breast cancer    . Breast cancer Maternal Aunt   . Asthma Maternal Aunt   . Birth defects Maternal Aunt   . Alcohol abuse Maternal Uncle     Social History   Social History  . Marital status: Single    Spouse name: N/A  . Number of children: 0  . Years of education: N/A   Occupational History  . retired Retired   Social History Main Topics  . Smoking status: Current Some Day Smoker    Packs/day: 0.50    Years: 58.00    Types: Cigarettes    Start date: 07/27/1964  . Smokeless tobacco: Never Used     Comment: 1 pack per week, tobacco infor given 12/30/15  . Alcohol use No  . Drug use: No  . Sexual activity: No   Other Topics Concern  . Not on file   Social History Narrative   Lives alone, continues to smoke, no dietary restrictions    Outpatient Medications Prior to Visit  Medication Sig Dispense Refill  . albuterol (ACCUNEB) 1.25 MG/3ML nebulizer solution Take 1 ampule by nebulization as needed for wheezing.    Marland Kitchen albuterol (PROVENTIL HFA;VENTOLIN HFA) 108 (90 Base) MCG/ACT inhaler Inhale 2 puffs into the lungs every 6 (six) hours as needed for wheezing  or shortness of breath. Only dispense Ventolin 1 Inhaler 3  . BD PEN NEEDLE NANO U/F 32G X 4 MM MISC USE AS DIRECTED WITH LEVEMIR FLEXPEN 100 each 0  . cetirizine (ZYRTEC) 10 MG tablet Take 10 mg by mouth daily as needed.     . Cholecalciferol (VITAMIN D3) 2000 units TABS Take 2,000 Units by mouth every morning.    . furosemide (LASIX) 20 MG tablet TAKE 1 TABLET BY MOUTH TWICE DAILY 60 tablet 0  . guaiFENesin (MUCINEX) 600 MG 12 hr tablet Take 1 tablet (600 mg total) by mouth 2 (two) times daily. 40 tablet 0  . insulin aspart (NOVOLOG) 100 UNIT/ML injection 4 units SQ q lunch daily and prn Sliding scale:  BS <200 no units BS 201-250 use 2 units BS 251-300 use 4 units BS 301-350 use 6 units BS 351-400 use 8 units BS 401-450 use 10 units BS>451 call MD 10 mL 3  . Insulin Detemir (LEVEMIR FLEXPEN) 100 UNIT/ML Pen  Inject 14 Units into the skin daily at 10 pm. 15 mL 5  . lactulose (CHRONULAC) 10 GM/15ML solution TAKE 45 MLS BY MOUTH TWICE DAILY AS NEEDED FOR MILD CONSTIPATION. 1892 mL 6  . NONFORMULARY OR COMPOUNDED [place] compound:  Authorized Substitiute Pain Cream - Ibuprofen 15%, Baclofen 1%, Gabapentin 3%, Lidocaine 2%, dispense 120 grams, apply 1-2 grams to affected area 3-4 times daily, +3Refills. 120 each 3  . omeprazole (PRILOSEC) 20 MG capsule TAKE 1 CAPSULE(20 MG) BY MOUTH DAILY 90 capsule 3  . ONE TOUCH ULTRA TEST test strip USE TWICE DAILY TO CHECK BLOOD SUGAR AS DIRECTED 100 each 4  . rifaximin (XIFAXAN) 550 MG TABS tablet Take 1 tablet (550 mg total) by mouth 2 (two) times daily. 60 tablet 11  . sodium chloride (OCEAN) 0.65 % SOLN nasal spray Place 1 spray into both nostrils as needed for congestion. 30 mL 0  . SPIRIVA RESPIMAT 2.5 MCG/ACT AERS Inhale 2.5 mcg into the lungs daily as needed. 3 Inhaler 3  . spironolactone (ALDACTONE) 50 MG tablet TAKE 2 TABLETS BY MOUTH DAILY FOR A TOTAL OF 100 MG 60 tablet 0  . Tiotropium Bromide Monohydrate (SPIRIVA RESPIMAT) 1.25 MCG/ACT AERS Inhale 2 puffs into the lungs daily. 2 Inhaler 0  . Insulin Syringe-Needle U-100 31G X 5/16" 1 ML MISC To use w/ Novolog 100 each 12   No facility-administered medications prior to visit.     Allergies  Allergen Reactions  . Citalopram     Irregular heart beat  . Erythromycin     Stomach cramps  . Glimepiride     Elevated ammonia levels  . Prednisone     Increased blood sugars too high  . Versed [Midazolam] Other (See Comments)    Patient stayed confusion stayed 4+days     Review of Systems  Constitutional: Negative for fever and malaise/fatigue.  HENT: Negative for congestion.   Eyes: Negative for blurred vision.  Respiratory: Negative for cough and shortness of breath.   Cardiovascular: Negative for chest pain, palpitations and leg swelling.  Gastrointestinal: Negative for vomiting.    Musculoskeletal: Positive for back pain.  Skin: Negative for rash.  Neurological: Negative for loss of consciousness and headaches.       Objective:    Physical Exam  Constitutional: She is oriented to person, place, and time. She appears well-developed and well-nourished. No distress.  HENT:  Head: Normocephalic and atraumatic.  Eyes: Conjunctivae are normal.  Neck: Normal range of motion. No  thyromegaly present.  Cardiovascular: Normal rate and regular rhythm.   Pulmonary/Chest: Effort normal and breath sounds normal. She has no wheezes.  Abdominal: Soft. Bowel sounds are normal. There is no tenderness.  Musculoskeletal: Normal range of motion. She exhibits no edema or deformity.  Neurological: She is alert and oriented to person, place, and time.  Skin: Skin is warm and dry. She is not diaphoretic.  Psychiatric: She has a normal mood and affect.    BP 128/66 (BP Location: Left Arm, Patient Position: Sitting, Cuff Size: Normal)   Pulse 82   Temp 98 F (36.7 C) (Oral)   Wt 159 lb 6.4 oz (72.3 kg)   SpO2 95%   BMI 24.97 kg/m  Wt Readings from Last 3 Encounters:  07/24/16 159 lb 6.4 oz (72.3 kg)  07/16/16 160 lb 4.4 oz (72.7 kg)  07/08/16 156 lb 6 oz (70.9 kg)     Lab Results  Component Value Date   WBC 6.9 06/29/2016   HGB 13.9 06/29/2016   HCT 39.8 06/29/2016   PLT 101 (L) 06/29/2016   GLUCOSE 225 (H) 07/24/2016   CHOL 187 02/25/2016   TRIG 112.0 02/25/2016   HDL 55.80 02/25/2016   LDLCALC 108 (H) 02/25/2016   ALT 24 06/29/2016   AST 36 (H) 06/29/2016   NA 140 07/24/2016   K 5.1 07/24/2016   CL 105 07/24/2016   CREATININE 1.21 (H) 07/24/2016   BUN 22 07/24/2016   CO2 29 07/24/2016   TSH 1.39 02/25/2016   INR 1.2 (H) 12/11/2015   HGBA1C 6.3 02/25/2016   MICROALBUR <0.7 02/25/2016    Lab Results  Component Value Date   TSH 1.39 02/25/2016   Lab Results  Component Value Date   WBC 6.9 06/29/2016   HGB 13.9 06/29/2016   HCT 39.8 06/29/2016   MCV  96 06/29/2016   PLT 101 (L) 06/29/2016   Lab Results  Component Value Date   NA 140 07/24/2016   K 5.1 07/24/2016   CHLORIDE 104 06/29/2016   CO2 29 07/24/2016   GLUCOSE 225 (H) 07/24/2016   BUN 22 07/24/2016   CREATININE 1.21 (H) 07/24/2016   BILITOT 1.78 (H) 06/29/2016   ALKPHOS 129 06/29/2016   AST 36 (H) 06/29/2016   ALT 24 06/29/2016   PROT 5.9 (L) 06/29/2016   ALBUMIN 2.7 (L) 06/29/2016   CALCIUM 8.9 07/24/2016   ANIONGAP 10 06/29/2016   EGFR 50 (L) 06/29/2016   GFR 46.32 (L) 07/24/2016   Lab Results  Component Value Date   CHOL 187 02/25/2016   Lab Results  Component Value Date   HDL 55.80 02/25/2016   Lab Results  Component Value Date   LDLCALC 108 (H) 02/25/2016   Lab Results  Component Value Date   TRIG 112.0 02/25/2016   Lab Results  Component Value Date   CHOLHDL 3 02/25/2016   Lab Results  Component Value Date   HGBA1C 6.3 02/25/2016       Assessment & Plan:   Problem List Items Addressed This Visit    Tobacco abuse disorder    Encouraged complete cessation. Discussed need to quit as relates to risk of numerous cancers, cardiac and pulmonary disease as well as neurologic complications. Counseled for greater than 3 minutes      Type 2 diabetes mellitus with hyperglycemia, with long-term current use of insulin (HCC)    hgba1c acceptable, minimize simple carbs. Increase exercise as tolerated. Continue current meds      Fall  Landing on her tailbone occurred several days ago. No incontinence but she is still in pain, is sent for imaging and she is encouraged to ice and try topical treatments. Try Tizanidine       Other Visit Diagnoses    Pain of right hip joint    -  Primary   Relevant Orders   DG HIP UNILAT WITH PELVIS 2-3 VIEWS RIGHT (Completed)      I am having Ms. Dutson start on methylPREDNISolone and tiZANidine. I am also having her maintain her omeprazole, guaiFENesin, sodium chloride, Insulin Detemir, albuterol, cetirizine,  ONE TOUCH ULTRA TEST, NONFORMULARY OR COMPOUNDED ITEM, albuterol, insulin aspart, lactulose, BD PEN NEEDLE NANO U/F, Tiotropium Bromide Monohydrate, spironolactone, furosemide, Vitamin D3, SPIRIVA RESPIMAT, rifaximin, and Insulin Syringe-Needle U-100.  Meds ordered this encounter  Medications  . Insulin Syringe-Needle U-100 31G X 5/16" 1 ML MISC    Sig: To use w/ Novolog    Dispense:  100 each    Refill:  12    Dx: E11.9  . methylPREDNISolone (MEDROL) 4 MG tablet    Sig: 5 tabs po x 1 d then 4 tabs po x 1 d then 3 tabs po x 1 d then 2 tabs po x 1 d then 1 tab po x 1 d then stop    Dispense:  15 tablet    Refill:  0  . tiZANidine (ZANAFLEX) 4 MG tablet    Sig: Take 1 tablet (4 mg total) by mouth every 8 (eight) hours as needed for muscle spasms.    Dispense:  30 tablet    Refill:  1    CMA served as scribe during this visit. History, Physical and Plan performed by medical provider. Documentation and orders reviewed and attested to.  Penni Homans, MD

## 2016-07-24 NOTE — Patient Instructions (Signed)
Add some Lidocaine gel and/or patches to the hip as well   Sacroiliac Joint Dysfunction Introduction Sacroiliac joint dysfunction is a condition that causes inflammation on one or both sides of the sacroiliac (SI) joint. The SI joint connects the lower part of the spine (sacrum) with the two upper portions of the pelvis (ilium). This condition causes deep aching or burning pain in the low back. In some cases, the pain may also spread into one or both buttocks or hips or spread down the legs. What are the causes? This condition may be caused by:  Pregnancy. During pregnancy, extra stress is put on the SI joints because the pelvis widens.  Injury, such as:  Car accidents.  Sport-related injuries.  Work-related injuries.  Having one leg that is shorter than the other.  Conditions that affect the joints, such as:  Rheumatoid arthritis.  Gout.  Psoriatic arthritis.  Joint infection (septic arthritis). Sometimes, the cause of SI joint dysfunction is not known. What are the signs or symptoms? Symptoms of this condition include:  Aching or burning pain in the lower back. The pain may also spread to other areas, such as:  Buttocks.  Groin.  Thighs and legs.  Muscle spasms in or around the painful areas.  Increased pain when standing, walking, running, stair climbing, bending, or lifting. How is this diagnosed? Your health care provider will do a physical exam and take your medical history. During the exam, the health care provider may move one or both of your legs to different positions to check for pain. Various tests may be done to help verify the diagnosis, including:  Imaging tests to look for other causes of pain. These may include:  MRI.  CT scan.  Bone scan.  Diagnostic injection. A numbing medicine is injected into the SI joint using a needle. If the pain is temporarily improved or stopped after the injection, this can indicate that SI joint dysfunction is the  problem. How is this treated? Treatment may vary depending on the cause and severity of your condition. Treatment options may include:  Applying ice or heat to the lower back area. This can help to reduce pain and muscle spasms.  Medicines to relieve pain or inflammation or to relax the muscles.  Wearing a back brace (sacroiliac brace) to help support the joint while your back is healing.  Physical therapy to increase muscle strength around the joint and flexibility at the joint. This may also involve learning proper body positions and ways of moving to relieve stress on the joint.  Direct manipulation of the SI joint.  Injections of steroid medicine into the joint in order to reduce pain and swelling.  Radiofrequency ablation to burn away nerves that are carrying pain messages from the joint.  Use of a device that provides electrical stimulation in order to reduce pain at the joint.  Surgery to put in screws and plates that limit or prevent joint motion. This is rare. Follow these instructions at home:  Rest as needed. Limit your activities as directed by your health care provider.  Take medicines only as directed by your health care provider.  If directed, apply ice to the affected area:  Put ice in a plastic bag.  Place a towel between your skin and the bag.  Leave the ice on for 20 minutes, 2-3 times per day.  Use a heating pad or a moist heat pack as directed by your health care provider.  Exercise as directed by your health  care provider or physical therapist.  Keep all follow-up visits as directed by your health care provider. This is important. Contact a health care provider if:  Your pain is not controlled with medicine.  You have a fever.  You have increasingly severe pain. Get help right away if:  You have weakness, numbness, or tingling in your legs or feet.  You lose control of your bladder or bowel. This information is not intended to replace advice  given to you by your health care provider. Make sure you discuss any questions you have with your health care provider. Document Released: 10/09/2008 Document Revised: 12/19/2015 Document Reviewed: 03/20/2014  2017 [employer]vier

## 2016-07-28 ENCOUNTER — Encounter (HOSPITAL_COMMUNITY): Admission: RE | Admit: 2016-07-28 | Payer: PPO | Source: Ambulatory Visit

## 2016-07-30 ENCOUNTER — Encounter: Payer: Self-pay | Admitting: Family Medicine

## 2016-07-30 ENCOUNTER — Encounter (HOSPITAL_COMMUNITY): Admission: RE | Admit: 2016-07-30 | Payer: PPO | Source: Ambulatory Visit

## 2016-07-30 DIAGNOSIS — W19XXXA Unspecified fall, initial encounter: Secondary | ICD-10-CM | POA: Insufficient documentation

## 2016-07-30 HISTORY — DX: Unspecified fall, initial encounter: W19.XXXA

## 2016-07-30 NOTE — Assessment & Plan Note (Signed)
Encouraged complete cessation. Discussed need to quit as relates to risk of numerous cancers, cardiac and pulmonary disease as well as neurologic complications. Counseled for greater than 3 minutes 

## 2016-07-30 NOTE — Assessment & Plan Note (Signed)
Landing on her tailbone occurred several days ago. No incontinence but she is still in pain, is sent for imaging and she is encouraged to ice and try topical treatments. Try Tizanidine

## 2016-07-30 NOTE — Assessment & Plan Note (Signed)
hgba1c acceptable, minimize simple carbs. Increase exercise as tolerated. Continue current meds 

## 2016-08-01 ENCOUNTER — Other Ambulatory Visit: Payer: Self-pay | Admitting: Family Medicine

## 2016-08-01 DIAGNOSIS — M25551 Pain in right hip: Secondary | ICD-10-CM

## 2016-08-03 ENCOUNTER — Other Ambulatory Visit: Payer: Self-pay

## 2016-08-03 ENCOUNTER — Encounter: Payer: Self-pay | Admitting: Physician Assistant

## 2016-08-03 DIAGNOSIS — K7581 Nonalcoholic steatohepatitis (NASH): Secondary | ICD-10-CM

## 2016-08-04 ENCOUNTER — Ambulatory Visit (INDEPENDENT_AMBULATORY_CARE_PROVIDER_SITE_OTHER): Payer: PPO | Admitting: Family Medicine

## 2016-08-04 ENCOUNTER — Encounter (HOSPITAL_COMMUNITY): Payer: PPO

## 2016-08-04 ENCOUNTER — Encounter: Payer: Self-pay | Admitting: Family Medicine

## 2016-08-04 DIAGNOSIS — S3992XA Unspecified injury of lower back, initial encounter: Secondary | ICD-10-CM

## 2016-08-04 NOTE — Patient Instructions (Signed)
You have sacral and gluteal contusions. You are improving which is good. I would avoid tylenol, aleve, ibuprofen given your liver issues. You could try a different muscle relaxant though there's no guarantee a different one wouldn't make you sleepy also. I do not think you need any additional imaging at this point. Continue using the cane. Heat for spasms 15 minutes at a time 3-4 times a day. If not improving over the next couple weeks call me and I would consider doing physical therapy.

## 2016-08-05 DIAGNOSIS — S3992XA Unspecified injury of lower back, initial encounter: Secondary | ICD-10-CM | POA: Insufficient documentation

## 2016-08-05 NOTE — Progress Notes (Signed)
PCP and consultation requested by: Penni Homans, MD  Subjective:   HPI: Patient is a 76 y.o. female here for buttock pain.  Patient reports on 12/24 she stepped up onto a curb with her left leg, lost her balance, and fell directly onto buttocks. Was having very severe pain locally. Pain 0/10 at rest, up to 6/10 with walking. Bothered her also after carrying her 18 pound cat to the vet. Worse when first getting up also, sharp. Tried steroid, muscle relaxant, heating pad. Feels past couple days like she's been improving. No radiation into legs. No bowel/bladder dysfunction. No groin pain. No numbness, tingling. Using a cane.  Past Medical History:  Diagnosis Date  . Anxiety   . Arthritis of both knees 10/01/2013  . Benign paroxysmal positional vertigo 10/01/2013  . Cancer [facility]) breast ca  right  . COPD (chronic obstructive pulmonary disease) ([place]) 10/01/2013  . Depression   . Diabetes mellitus type 2  . Emphysema   . Encephalopathy, hepatic ([place]) 06/07/2014  . Esophageal reflux 10/01/2013  . Fall 07/30/2016  . Hyperlipidemia   . Hyperlipidemia, mixed   . Increased ammonia level 11/25/2014  . NASH (nonalcoholic steatohepatitis) 08/26/2015  . Neck pain 10/01/2013  . Neuropathy (HCC)    feet   . Overactive bladder 12/10/2013  . Panic attacks   . Pedal edema 12/10/2013  . Preventative health care 03/08/2016  . Tobacco abuse disorder 02/01/2014    Current Outpatient Prescriptions on File Prior to Visit  Medication Sig Dispense Refill  . albuterol (ACCUNEB) 1.25 MG/3ML nebulizer solution Take 1 ampule by nebulization as needed for wheezing.    Marland Kitchen albuterol (PROVENTIL HFA;VENTOLIN HFA) 108 (90 Base) MCG/ACT inhaler Inhale 2 puffs into the lungs every 6 (six) hours as needed for wheezing or shortness of breath. Only dispense Ventolin 1 Inhaler 3  . BD PEN NEEDLE NANO U/F 32G X 4 MM MISC USE AS DIRECTED WITH LEVEMIR FLEXPEN 100 each 0  . cetirizine (ZYRTEC) 10 MG tablet Take 10 mg by mouth daily  as needed.     . Cholecalciferol (VITAMIN D3) 2000 units TABS Take 2,000 Units by mouth every morning.    . furosemide (LASIX) 20 MG tablet TAKE 1 TABLET BY MOUTH TWICE DAILY 60 tablet 0  . guaiFENesin (MUCINEX) 600 MG 12 hr tablet Take 1 tablet (600 mg total) by mouth 2 (two) times daily. 40 tablet 0  . insulin aspart (NOVOLOG) 100 UNIT/ML injection 4 units SQ q lunch daily and prn Sliding scale:  BS <200 no units BS 201-250 use 2 units BS 251-300 use 4 units BS 301-350 use 6 units BS 351-400 use 8 units BS 401-450 use 10 units BS>451 call MD 10 mL 3  . Insulin Detemir (LEVEMIR FLEXPEN) 100 UNIT/ML Pen Inject 14 Units into the skin daily at 10 pm. 15 mL 5  . Insulin Syringe-Needle U-100 31G X 5/16" 1 ML MISC To use w/ Novolog 100 each 12  . lactulose (CHRONULAC) 10 GM/15ML solution TAKE 45 MLS BY MOUTH TWICE DAILY AS NEEDED FOR MILD CONSTIPATION. 1892 mL 6  . methylPREDNISolone (MEDROL) 4 MG tablet 5 tabs po x 1 d then 4 tabs po x 1 d then 3 tabs po x 1 d then 2 tabs po x 1 d then 1 tab po x 1 d then stop 15 tablet 0  . NONFORMULARY OR COMPOUNDED [place] compound:  Authorized Substitiute Pain Cream - Ibuprofen 15%, Baclofen 1%, Gabapentin 3%, Lidocaine 2%, dispense 120 grams, apply  1-2 grams to affected area 3-4 times daily, +3Refills. 120 each 3  . omeprazole (PRILOSEC) 20 MG capsule TAKE 1 CAPSULE(20 MG) BY MOUTH DAILY 90 capsule 3  . ONE TOUCH ULTRA TEST test strip USE TWICE DAILY TO CHECK BLOOD SUGAR AS DIRECTED 100 each 4  . rifaximin (XIFAXAN) 550 MG TABS tablet Take 1 tablet (550 mg total) by mouth 2 (two) times daily. 60 tablet 11  . sodium chloride (OCEAN) 0.65 % SOLN nasal spray Place 1 spray into both nostrils as needed for congestion. 30 mL 0  . SPIRIVA RESPIMAT 2.5 MCG/ACT AERS Inhale 2.5 mcg into the lungs daily as needed. 3 Inhaler 3  . spironolactone (ALDACTONE) 50 MG tablet TAKE 2 TABLETS BY MOUTH DAILY FOR A TOTAL OF 100 MG 60 tablet 0  . Tiotropium Bromide  Monohydrate (SPIRIVA RESPIMAT) 1.25 MCG/ACT AERS Inhale 2 puffs into the lungs daily. 2 Inhaler 0  . tiZANidine (ZANAFLEX) 4 MG tablet Take 1 tablet (4 mg total) by mouth every 8 (eight) hours as needed for muscle spasms. 30 tablet 1   No current facility-administered medications on file prior to visit.     Past Surgical History:  Procedure Laterality Date  . APPENDECTOMY  2007  . BREAST SURGERY  2009 right  . CATARACT EXTRACTION     x 2  . [place]  . KNEE SURGERY    . MANDIBLE FRACTURE SURGERY    . PILONIDAL CYST EXCISION    . TONSILLECTOMY      Allergies  Allergen Reactions  . Citalopram     Irregular heart beat  . Erythromycin     Stomach cramps  . Glimepiride     Elevated ammonia levels  . Prednisone     Increased blood sugars too high  . Versed [Midazolam] Other (See Comments)    Patient stayed confusion stayed 4+days     Social History   Social History  . Marital status: Single    Spouse name: N/A  . Number of children: 0  . Years of education: N/A   Occupational History  . retired Retired   Social History Main Topics  . Smoking status: Current Some Day Smoker    Packs/day: 0.50    Years: 58.00    Types: Cigarettes    Start date: 07/27/1964  . Smokeless tobacco: Never Used     Comment: 1 pack per week, tobacco infor given 12/30/15  . Alcohol use No  . Drug use: No  . Sexual activity: No   Other Topics Concern  . Not on file   Social History Narrative   Lives alone, continues to smoke, no dietary restrictions    Family History  Problem Relation Age of Onset  . Heart failure Father   . COPD Father   . Arthritis Father 68  . Stroke Mother   . Arthritis Mother 41  . Hyperlipidemia Mother   . Hypertension Mother   . Diabetes Mother   . Diabetes Sister   . Breast cancer    . Breast cancer Maternal Aunt   . Asthma Maternal Aunt   . Birth defects Maternal Aunt   . Alcohol abuse Maternal Uncle     BP 106/68   Pulse 86   Ht  5\' 7"  (1.702 m)   Wt 156 lb (70.8 kg)   BMI 24.43 kg/m   Review of Systems: See HPI above.     Objective:  Physical Exam:  Gen: NAD, comfortable in exam room  Back/hips: No gross deformity, scoliosis. No TTP currently (points just to right of sacrum as area where she has pain).  No midline or bony TTP. FROM hips without pain.  Able to flex and extend back without reproducing pain. Strength LEs 5/5 all muscle groups. Negative SLRs. Sensation intact to light touch bilaterally. Negative logroll bilateral hips   Assessment & Plan:  1. Low back injury - consistent with gluteal, sacral contusion.  Independently reviewed radiographs of hip and pelvis without evidence fracture, other bony irregularities from fall.  She has been improving past couple days.  Exam is reassuring against occult vertebral compression fracture.  No bony tenderness.  No evidence of radiculopathy either.  She would like to wait on doing physical therapy.  Will continue with muscle relaxant, heat for spasms.  S/p steroid.  Continue with cane for stability.  Call us if not continuing to improve over next 1-2 weeks.

## 2016-08-05 NOTE — Assessment & Plan Note (Signed)
consistent with gluteal, sacral contusion.  Independently reviewed radiographs of hip and pelvis without evidence fracture, other bony irregularities from fall.  She has been improving past couple days.  Exam is reassuring against occult vertebral compression fracture.  No bony tenderness.  No evidence of radiculopathy either.  She would like to wait on doing physical therapy.  Will continue with muscle relaxant, heat for spasms.  S/p steroid.  Continue with cane for stability.  Call us if not continuing to improve over next 1-2 weeks.

## 2016-08-06 ENCOUNTER — Telehealth (HOSPITAL_COMMUNITY): Payer: Self-pay | Admitting: *Deleted

## 2016-08-06 ENCOUNTER — Encounter (HOSPITAL_COMMUNITY): Payer: PPO

## 2016-08-07 ENCOUNTER — Other Ambulatory Visit (INDEPENDENT_AMBULATORY_CARE_PROVIDER_SITE_OTHER): Payer: PPO

## 2016-08-07 ENCOUNTER — Encounter (HOSPITAL_COMMUNITY): Payer: Self-pay | Admitting: *Deleted

## 2016-08-07 DIAGNOSIS — K7581 Nonalcoholic steatohepatitis (NASH): Secondary | ICD-10-CM

## 2016-08-07 DIAGNOSIS — G8929 Other chronic pain: Secondary | ICD-10-CM | POA: Diagnosis not present

## 2016-08-07 DIAGNOSIS — R1031 Right lower quadrant pain: Secondary | ICD-10-CM | POA: Diagnosis not present

## 2016-08-07 LAB — BASIC METABOLIC PANEL
BUN: 22 mg/dL (ref 6–23)
CHLORIDE: 105 meq/L (ref 96–112)
CO2: 31 meq/L (ref 19–32)
Calcium: 9.1 mg/dL (ref 8.4–10.5)
Creatinine, Ser: 1.1 mg/dL (ref 0.40–1.20)
GFR: 51.7 mL/min — ABNORMAL LOW (ref >60.00)
Glucose, Bld: 81 mg/dL (ref 70–99)
Potassium: 4.7 mEq/L (ref 3.5–5.1)
SODIUM: 138 meq/L (ref 135–145)

## 2016-08-08 LAB — AMMONIA: Ammonia: 70 umol/L — ABNORMAL HIGH (ref <=47)

## 2016-08-11 ENCOUNTER — Telehealth: Payer: Self-pay

## 2016-08-11 ENCOUNTER — Encounter (HOSPITAL_COMMUNITY): Payer: PPO

## 2016-08-11 ENCOUNTER — Encounter (HOSPITAL_COMMUNITY): Payer: Self-pay | Admitting: *Deleted

## 2016-08-11 ENCOUNTER — Encounter: Payer: Self-pay | Admitting: Physician Assistant

## 2016-08-11 NOTE — Telephone Encounter (Signed)
Spoke with a representative at [facility] about the status of patient's assistance for [employer]. Was told that the patient had been approved until 07/26/2017 and currently has 3 refills remaining. I will shred the forms that were left behind.

## 2016-08-13 ENCOUNTER — Encounter (HOSPITAL_COMMUNITY): Payer: Self-pay

## 2016-08-13 ENCOUNTER — Encounter (HOSPITAL_COMMUNITY): Payer: PPO

## 2016-08-13 ENCOUNTER — Other Ambulatory Visit: Payer: Self-pay | Admitting: Family Medicine

## 2016-08-14 ENCOUNTER — Encounter (HOSPITAL_COMMUNITY): Payer: Self-pay | Admitting: Gastroenterology

## 2016-08-14 ENCOUNTER — Encounter (HOSPITAL_COMMUNITY)
Admission: RE | Disposition: A | Discharge status: Home / Self Care | Payer: Self-pay | Source: Ambulatory Visit | Attending: Gastroenterology

## 2016-08-14 ENCOUNTER — Ambulatory Visit (HOSPITAL_COMMUNITY)
Admission: RE | Admit: 2016-08-14 | Discharge: 2016-08-14 | Disposition: A | Discharge status: Home / Self Care | Payer: PPO | Source: Ambulatory Visit | Attending: Gastroenterology | Admitting: Gastroenterology

## 2016-08-14 ENCOUNTER — Ambulatory Visit (HOSPITAL_COMMUNITY): Payer: PPO | Admitting: Certified Registered Nurse Anesthetist

## 2016-08-14 ENCOUNTER — Other Ambulatory Visit: Payer: Self-pay | Admitting: Gastroenterology

## 2016-08-14 DIAGNOSIS — K269 Duodenal ulcer, unspecified as acute or chronic, without hemorrhage or perforation: Secondary | ICD-10-CM | POA: Insufficient documentation

## 2016-08-14 DIAGNOSIS — K7460 Unspecified cirrhosis of liver: Secondary | ICD-10-CM | POA: Diagnosis not present

## 2016-08-14 DIAGNOSIS — I8500 Esophageal varices without bleeding: Secondary | ICD-10-CM | POA: Insufficient documentation

## 2016-08-14 DIAGNOSIS — Z794 Long term (current) use of insulin: Secondary | ICD-10-CM | POA: Insufficient documentation

## 2016-08-14 DIAGNOSIS — E1140 Type 2 diabetes mellitus with diabetic neuropathy, unspecified: Secondary | ICD-10-CM | POA: Diagnosis not present

## 2016-08-14 DIAGNOSIS — I8510 Secondary esophageal varices without bleeding: Secondary | ICD-10-CM

## 2016-08-14 DIAGNOSIS — Z853 Personal history of malignant neoplasm of breast: Secondary | ICD-10-CM | POA: Diagnosis not present

## 2016-08-14 DIAGNOSIS — K766 Portal hypertension: Secondary | ICD-10-CM | POA: Diagnosis not present

## 2016-08-14 DIAGNOSIS — K3189 Other diseases of stomach and duodenum: Secondary | ICD-10-CM | POA: Diagnosis not present

## 2016-08-14 DIAGNOSIS — K2210 Ulcer of esophagus without bleeding: Secondary | ICD-10-CM | POA: Insufficient documentation

## 2016-08-14 DIAGNOSIS — J449 Chronic obstructive pulmonary disease, unspecified: Secondary | ICD-10-CM | POA: Insufficient documentation

## 2016-08-14 DIAGNOSIS — K449 Diaphragmatic hernia without obstruction or gangrene: Secondary | ICD-10-CM | POA: Insufficient documentation

## 2016-08-14 DIAGNOSIS — K219 Gastro-esophageal reflux disease without esophagitis: Secondary | ICD-10-CM | POA: Insufficient documentation

## 2016-08-14 DIAGNOSIS — E119 Type 2 diabetes mellitus without complications: Secondary | ICD-10-CM | POA: Diagnosis not present

## 2016-08-14 DIAGNOSIS — E1151 Type 2 diabetes mellitus with diabetic peripheral angiopathy without gangrene: Secondary | ICD-10-CM | POA: Diagnosis not present

## 2016-08-14 DIAGNOSIS — F17210 Nicotine dependence, cigarettes, uncomplicated: Secondary | ICD-10-CM | POA: Insufficient documentation

## 2016-08-14 DIAGNOSIS — Z79899 Other long term (current) drug therapy: Secondary | ICD-10-CM | POA: Insufficient documentation

## 2016-08-14 DIAGNOSIS — K279 Peptic ulcer, site unspecified, unspecified as acute or chronic, without hemorrhage or perforation: Secondary | ICD-10-CM | POA: Diagnosis not present

## 2016-08-14 HISTORY — PX: ESOPHAGOGASTRODUODENOSCOPY (EGD) WITH PROPOFOL: SHX5813

## 2016-08-14 LAB — GLUCOSE, CAPILLARY: Glucose-Capillary: 87 mg/dL (ref 65–99)

## 2016-08-14 SURGERY — ESOPHAGOGASTRODUODENOSCOPY (EGD) WITH PROPOFOL
Anesthesia: Monitor Anesthesia Care

## 2016-08-14 MED ORDER — LACTATED RINGERS IV SOLN
INTRAVENOUS | Status: DC
  Administered 2016-08-14: 12:00:00 via INTRAVENOUS

## 2016-08-14 MED ORDER — PROPOFOL 500 MG/50ML IV EMUL
INTRAVENOUS | Status: DC | PRN
  Administered 2016-08-14: 100 ug/kg/min via INTRAVENOUS

## 2016-08-14 MED ORDER — LIDOCAINE 2% (20 MG/ML) 5 ML SYRINGE
INTRAMUSCULAR | Status: DC | PRN
  Administered 2016-08-14: 12 mg via INTRAVENOUS

## 2016-08-14 MED ORDER — TRAMADOL HCL ER 100 MG PO TB24: 100.0000 mg | ORAL_TABLET | Freq: Every day | ORAL | 1 refills | Status: DC | PRN

## 2016-08-14 MED ORDER — PROPOFOL 10 MG/ML IV BOLUS
INTRAVENOUS | Status: DC | PRN
  Administered 2016-08-14: 50 mg via INTRAVENOUS

## 2016-08-14 SURGICAL SUPPLY — 14 items

## 2016-08-14 NOTE — Op Note (Signed)
[facility] Patient Name: Ashley Savage Procedure Date: 08/14/2016 MRN: [id number] Attending MD: Mauri Pole , MD Date of Birth: October 11, 1940 CSN: [id number] Age: 76 Admit Type: Outpatient Procedure:                Upper GI endoscopy Indications:              Follow-up of esophageal varices in patient with                            suspected portal hypertension Providers:                Mauri Pole, MD, Hilma Favors, RN, Alfonso Patten, Technician, Heide Scales, CRNA Referring MD:              Medicines:                Monitored Anesthesia Care Complications:            No immediate complications. Estimated Blood Loss:     Estimated blood loss: none. Procedure:                Pre-Anesthesia Assessment:                           - Prior to the procedure, a History and Physical                            was performed, and patient medications and                            allergies were reviewed. The patient's tolerance of                            previous anesthesia was also reviewed. The risks                            and benefits of the procedure and the sedation                            options and risks were discussed with the patient.                            All questions were answered, and informed consent                            was obtained. Prior Anticoagulants: The patient has                            taken no previous anticoagulant or antiplatelet                            agents. ASA Grade Assessment: IV - A patient with  severe systemic disease that is a constant threat                            to life. After reviewing the risks and benefits,                            the patient was deemed in satisfactory condition to                            undergo the procedure.                           After obtaining informed consent, the endoscope was                             passed under direct vision. Throughout the                            procedure, the patient's blood pressure, pulse, and                            oxygen saturations were monitored continuously. The                            [id number] [phone]) scope was introduced through the                            mouth, and advanced to the second part of duodenum.                            The upper GI endoscopy was accomplished without                            difficulty. The patient tolerated the procedure                            well. Scope In: Scope Out: Findings:      Two columns of Grade I varices were found in the middle third of the       esophagus and in the lower third of the esophagus. They were small in       size.      One superficial esophageal ulcer with no bleeding and no stigmata of       recent bleeding was found 34 to 35 cm from the incisors. The lesion was       3 mm in largest dimension. Non obstructive distal esophageal stricture       ~16-18 mm in size, not dilated      A 5 cm hiatal hernia was present.      Mild portal hypertensive gastropathy was found in the entire examined       stomach.      Few non-bleeding cratered duodenal ulcers with no stigmata of bleeding       were found in the duodenal bulb. The largest lesion was 4 mm in largest       dimension. Impression:               -  Grade I esophageal varices.                           - Non-bleeding esophageal ulcer.                           - 5 cm hiatal hernia.                           - Portal hypertensive gastropathy.                           - Multiple non-bleeding duodenal ulcers with no                            stigmata of bleeding.                           - No specimens collected. Moderate Sedation:      N/A- Per Anesthesia Care Recommendation:           - Patient has a contact number available for                            emergencies. The signs and symptoms of potential                             delayed complications were discussed with the                            patient. Return to normal activities tomorrow.                            Written discharge instructions were provided to the                            patient.                           - Resume previous diet.                           - Continue present medications.                           - No aspirin, ibuprofen, naproxen, or other                            non-steroidal anti-inflammatory drugs.                           -Ok to use Tramadol as needed for pain control for                            severe R hip pain                           - Return to  GI office in 3 months. Procedure Code(s):        --- Professional ---                           [phone], Esophagogastroduodenoscopy, flexible,                            transoral; diagnostic, including collection of                            specimen(s) by brushing or washing, when performed                            (separate procedure) Diagnosis Code(s):        --- Professional ---                           I85.00, Esophageal varices without bleeding                           K22.10, Ulcer of esophagus without bleeding                           K44.9, Diaphragmatic hernia without obstruction or                            gangrene                           K76.6, Portal hypertension                           K31.89, Other diseases of stomach and duodenum                           K26.9, Duodenal ulcer, unspecified as acute or                            chronic, without hemorrhage or perforation CPT copyright 2016 American Medical Association. All rights reserved. The codes documented in this report are preliminary and upon coder review may  be revised to meet current compliance requirements. Mauri Pole, MD 08/14/2016 12:36:51 PM This report has been signed electronically. Number of Addenda: 0

## 2016-08-14 NOTE — Transfer of Care (Signed)
Immediate Anesthesia Transfer of Care Note  Patient: Ashley Savage  Procedure(s) Performed: Procedure(s): ESOPHAGOGASTRODUODENOSCOPY (EGD) WITH PROPOFOL (N/A)  Patient Location: PACU  Anesthesia Type:MAC  Level of Consciousness: Patient easily awoken, sedated, comfortable, cooperative, following commands, responds to stimulation.   Airway & Oxygen Therapy: Patient spontaneously breathing, ventilating well, oxygen via simple oxygen mask.  Post-op Assessment: Report given to PACU RN, vital signs reviewed and stable, moving all extremities.   Post vital signs: Reviewed and stable.  Complications: No apparent anesthesia complications Last Vitals:  Vitals:   08/14/16 1124  BP: (!) 104/43  Pulse: (!) 12  Resp: (!) 64  Temp: 36.4 C    Last Pain:  Vitals:   08/14/16 1124  TempSrc: Oral         Complications: No apparent anesthesia complications

## 2016-08-14 NOTE — Anesthesia Preprocedure Evaluation (Signed)
Anesthesia Evaluation  Patient identified by MRN, date of birth, ID band Patient awake    Reviewed: Allergy & Precautions, NPO status   Airway Mallampati: II   Neck ROM: Full    Dental  (+) Edentulous Upper, Edentulous Lower   Pulmonary COPD, Current Smoker,   Smokers cough and mild wheezes   + wheezing      Cardiovascular + Peripheral Vascular Disease   Rhythm:Regular Rate:Normal     Neuro/Psych  Neuromuscular disease    GI/Hepatic GERD  ,(+) Cirrhosis   Esophageal Varices    ,   Endo/Other  diabetes, Type 1, Insulin Dependent  Renal/GU      Musculoskeletal  (+) Arthritis ,   Abdominal   Peds  Hematology   Anesthesia Other Findings   Reproductive/Obstetrics                             Anesthesia Physical Anesthesia Plan  ASA: III  Anesthesia Plan: MAC   Post-op Pain Management:    Induction: Intravenous  Airway Management Planned: Natural Airway and Nasal Cannula  Additional Equipment:   Intra-op Plan:   Post-operative Plan:   Informed Consent: I have reviewed the patients History and Physical, chart, labs and discussed the procedure including the risks, benefits and alternatives for the proposed anesthesia with the patient or authorized representative who has indicated his/her understanding and acceptance.   Dental advisory given  Plan Discussed with:   Anesthesia Plan Comments:         Anesthesia Quick Evaluation

## 2016-08-14 NOTE — H&P (Signed)
[facility] History and Physical   Primary Care Physician:  Penni Homans, MD   Reason for Procedure:  Surveillance of esophageal varices Plan:    EGD with possible esophageal variceal banding     HPI: Ashley Savage is a 76 y.o. female with h/o cirrhosis decompensated by hepatic encephalopathy here for EGD for esophageal surveillance. Patient fell last minute while walking on the curb, no LOC. Caused bruising of R hip and c/o severe pain. No fracture, was seen by PMD and sports medicine specialist. Denies any nausea, vomiting, abdominal pain, melena or bright red blood per rectum    Past Medical History:  Diagnosis Date  . Anxiety   . Arthritis of both knees 10/01/2013  . Benign paroxysmal positional vertigo 10/01/2013  . Cancer [facility]) breast ca  right  . COPD (chronic obstructive pulmonary disease) ([place]) 10/01/2013  . Depression   . Diabetes mellitus type 2  . Emphysema   . Encephalopathy, hepatic ([place]) 06/07/2014  . Esophageal reflux 10/01/2013  . Fall 07/30/2016  . Hyperlipidemia   . Hyperlipidemia, mixed   . Increased ammonia level 11/25/2014  . NASH (nonalcoholic steatohepatitis) 08/26/2015  . Neck pain 10/01/2013  . Neuropathy (HCC)    feet   . Overactive bladder 12/10/2013  . Panic attacks   . Pedal edema 12/10/2013  . Preventative health care 03/08/2016  . Tobacco abuse disorder 02/01/2014    Past Surgical History:  Procedure Laterality Date  . APPENDECTOMY  2007  . BREAST SURGERY  2009 right  . CATARACT EXTRACTION     x 2  . [place]  . KNEE SURGERY    . MANDIBLE FRACTURE SURGERY    . PILONIDAL CYST EXCISION    . TONSILLECTOMY      Prior to Admission medications   Medication Sig Start Date End Date Taking? Authorizing Provider  albuterol (PROVENTIL HFA;VENTOLIN HFA) 108 (90 Base) MCG/ACT inhaler Inhale 2 puffs into the lungs every 6 (six) hours as needed for wheezing or shortness of breath. Only dispense Ventolin 02/25/16  Yes Mosie Lukes,  MD  BD PEN NEEDLE NANO U/F 32G X 4 MM MISC USE AS DIRECTED WITH LEVEMIR FLEXPEN 05/13/16  Yes Mosie Lukes, MD  Cholecalciferol (VITAMIN D3) 2000 units TABS Take 2,000 Units by mouth every morning.   Yes Historical Provider, MD  furosemide (LASIX) 20 MG tablet TAKE 1 TABLET BY MOUTH TWICE DAILY Patient taking differently: TAKE 1 TABLET BY MOUTH ONCE DAILY 06/29/16  Yes Mauri Pole, MD  Insulin Detemir (LEVEMIR FLEXPEN) 100 UNIT/ML Pen Inject 14 Units into the skin daily at 10 pm. Patient taking differently: Inject 16 Units into the skin daily at 10 pm.  08/26/15  Yes Mosie Lukes, MD  lactulose ([place]) 10 GM/15ML solution TAKE 45 MLS BY MOUTH TWICE DAILY AS NEEDED FOR MILD CONSTIPATION. Patient taking differently: Take 23mls in the morning and take 60 mls in the afternoon 03/02/16  Yes Mosie Lukes, MD  LIDOCAINE EX Apply 1 application topically at bedtime as needed (pain).   Yes Historical Provider, MD  naproxen sodium (ANAPROX) 220 MG tablet Take 220 mg by mouth daily as needed (pain).   Yes Historical Provider, MD  omeprazole (PRILOSEC) 20 MG capsule TAKE 1 CAPSULE(20 MG) BY MOUTH DAILY 08/14/16  Yes Mosie Lukes, MD  Polyvinyl Alcohol (LUBRICANT DROPS OP) Apply 1 drop to eye daily as needed (dry eyes).   Yes Historical Provider, MD  sodium chloride (OCEAN) 0.65 % SOLN  nasal spray Place 1 spray into both nostrils as needed for congestion. 08/01/15  Yes Barton Dubois, MD  SPIRIVA RESPIMAT 2.5 MCG/ACT AERS Inhale 2.5 mcg into the lungs daily as needed. Patient taking differently: Inhale 2 puffs into the lungs daily.  07/03/16  Yes Rigoberto Noel, MD  spironolactone (ALDACTONE) 50 MG tablet TAKE 2 TABLETS BY MOUTH DAILY FOR A TOTAL OF 100 MG 06/29/16  Yes Kavitha Nandigam V, MD  tiZANidine (ZANAFLEX) 4 MG tablet Take 1 tablet (4 mg total) by mouth every 8 (eight) hours as needed for muscle spasms. Patient taking differently: Take 2 mg by mouth every 8 (eight) hours as needed for muscle  spasms.  07/24/16  Yes Mosie Lukes, MD  albuterol (ACCUNEB) 1.25 MG/3ML nebulizer solution Take 1 ampule by nebulization every 6 (six) hours as needed for wheezing.     Historical Provider, MD  cetirizine (ZYRTEC) 10 MG tablet Take 10 mg by mouth daily as needed for allergies.     Historical Provider, MD  guaiFENesin (MUCINEX) 600 MG 12 hr tablet Take 1 tablet (600 mg total) by mouth 2 (two) times daily. Patient not taking: Reported on 08/10/2016 08/01/15   Barton Dubois, MD  insulin aspart (NOVOLOG) 100 UNIT/ML injection 4 units SQ q lunch daily and prn Sliding scale:  BS <200 no units BS 201-250 use 2 units BS 251-300 use 4 units BS 301-350 use 6 units BS 351-400 use 8 units BS 401-450 use 10 units BS>451 call MD 02/25/16   Mosie Lukes, MD  Insulin Syringe-Needle U-100 31G X 5/16" 1 ML MISC To use w/ Novolog 07/24/16   Mosie Lukes, MD  methylPREDNISolone (MEDROL) 4 MG tablet 5 tabs po x 1 d then 4 tabs po x 1 d then 3 tabs po x 1 d then 2 tabs po x 1 d then 1 tab po x 1 d then stop Patient not taking: Reported on 08/10/2016 07/24/16   Mosie Lukes, MD  ONE TOUCH ULTRA TEST test strip USE TWICE DAILY TO CHECK BLOOD SUGAR AS DIRECTED 12/03/15   Mosie Lukes, MD  rifaximin (XIFAXAN) 550 MG TABS tablet Take 1 tablet (550 mg total) by mouth 2 (two) times daily. 07/08/16   Amy S E[place], PA-C  Tiotropium Bromide Monohydrate (SPIRIVA RESPIMAT) 1.25 MCG/ACT AERS Inhale 2 puffs into the lungs daily. Patient not taking: Reported on 08/10/2016 06/10/16   Rigoberto Noel, MD    Current Facility-Administered Medications  Medication Dose Route Frequency Provider Last Rate Last Dose  . lactated ringers infusion   Intravenous Continuous Harl Bowie V, MD 20 mL/hr at 08/14/16 1140      Allergies as of 06/29/2016 - Review Complete 06/29/2016  Allergen Reaction Noted  . Citalopram  04/22/2012  . Erythromycin  02/26/2011  . Glimepiride  02/03/2015  . Prednisone  02/27/2016  . Versed  [midazolam] Other (See Comments) 10/25/2014    Family History  Problem Relation Age of Onset  . Heart failure Father   . COPD Father   . Arthritis Father 53  . Stroke Mother   . Arthritis Mother 84  . Hyperlipidemia Mother   . Hypertension Mother   . Diabetes Mother   . Diabetes Sister   . Breast cancer    . Breast cancer Maternal Aunt   . Asthma Maternal Aunt   . Birth defects Maternal Aunt   . Alcohol abuse Maternal Uncle     Social History   Social History  . Marital  status: Single    Spouse name: N/A  . Number of children: 0  . Years of education: N/A   Occupational History  . retired Retired   Social History Main Topics  . Smoking status: Current Some Day Smoker    Packs/day: 0.50    Years: 58.00    Types: Cigarettes    Start date: 07/27/1964  . Smokeless tobacco: Never Used     Comment: 1 pack per week, tobacco infor given 12/30/15  . Alcohol use No  . Drug use: No  . Sexual activity: No   Other Topics Concern  . Not on file   Social History Narrative   Lives alone, continues to smoke, no dietary restrictions    Review of Systems:  All other review of systems negative except as mentioned in the HPI.  Physical Exam: Vital signs in last 24 hours: Temp:  [97.5 F (36.4 C)] 97.5 F (36.4 C) (01/19 1124) Pulse Rate:  [12] 12 (01/19 1124) Resp:  [64] 64 (01/19 1124) BP: (104)/(43) 104/43 (01/19 1124) SpO2:  [98 %] 98 % (01/19 1124) Weight:  [156 lb (70.8 kg)] 156 lb (70.8 kg) (01/19 1124)   General:   Alert,  Well-developed, well-nourished, pleasant and cooperative in NAD Lungs:  Clear throughout to auscultation.   Heart:  Regular rate and rhythm; no murmurs, clicks, rubs,  or gallops. Abdomen:  Soft, nontender and nondistended. Normal bowel sounds.   Neuro/Psych:  Alert and cooperative. Normal mood and affect. A and O x 3   @K .Denzil Magnuson, MD [phone] Mon-Fri 8a-5p [phone] after 5p, weekends, holidays 08/14/2016 12:11 PM@

## 2016-08-14 NOTE — Discharge Instructions (Signed)
YOU HAD AN ENDOSCOPIC PROCEDURE TODAY: Refer to the procedure report and other information in the discharge instructions given to you for any specific questions about what was found during the examination. If this information does not answer your questions, please call [facility] office at [phone] to clarify.  ° °YOU SHOULD EXPECT: Some feelings of bloating in the abdomen. Passage of more gas than usual. Walking can help get rid of the air that was put into your GI tract during the procedure and reduce the bloating. If you had a lower endoscopy (such as a colonoscopy or flexible sigmoidoscopy) you may notice spotting of blood in your stool or on the toilet paper. Some abdominal soreness may be present for a day or two, also. ° °DIET: Your first meal following the procedure should be a light meal and then it is ok to progress to your normal diet. A half-sandwich or bowl of soup is an example of a good first meal. Heavy or fried foods are harder to digest and may make you feel nauseous or bloated. Drink plenty of fluids but you should avoid alcoholic beverages for 24 hours. If you had a esophageal dilation, please see attached instructions for diet.   ° °ACTIVITY: Your care partner should take you home directly after the procedure. You should plan to take it easy, moving slowly for the rest of the day. You can resume normal activity the day after the procedure however YOU SHOULD NOT DRIVE, use power tools, machinery or perform tasks that involve climbing or major physical exertion for 24 hours (because of the sedation medicines used during the test).  ° °SYMPTOMS TO REPORT IMMEDIATELY: °A gastroenterologist can be reached at any hour. Please call [phone]  for any of the following symptoms:  °Following lower endoscopy (colonoscopy, flexible sigmoidoscopy) °Excessive amounts of blood in the stool  °Significant tenderness, worsening of abdominal pains  °Swelling of the abdomen that is new, acute  °Fever of 100° or  higher  °Following upper endoscopy (EGD, EUS, ERCP, esophageal dilation) °Vomiting of blood or coffee ground material  °New, significant abdominal pain  °New, significant chest pain or pain under the shoulder blades  °Painful or persistently difficult swallowing  °New shortness of breath  °Black, tarry-looking or red, bloody stools ° °FOLLOW UP:  °If any biopsies were taken you will be contacted by phone or by letter within the next 1-3 weeks. Call [phone]  if you have not heard about the biopsies in 3 weeks.  °Please also call with any specific questions about appointments or follow up tests. ° °

## 2016-08-15 ENCOUNTER — Other Ambulatory Visit: Payer: Self-pay | Admitting: Family Medicine

## 2016-08-15 ENCOUNTER — Other Ambulatory Visit: Payer: Self-pay | Admitting: Gastroenterology

## 2016-08-17 ENCOUNTER — Encounter (HOSPITAL_COMMUNITY): Payer: Self-pay | Admitting: Gastroenterology

## 2016-08-17 ENCOUNTER — Encounter: Payer: Self-pay | Admitting: Physician Assistant

## 2016-08-17 DIAGNOSIS — K3189 Other diseases of stomach and duodenum: Secondary | ICD-10-CM

## 2016-08-17 DIAGNOSIS — I8510 Secondary esophageal varices without bleeding: Secondary | ICD-10-CM

## 2016-08-17 DIAGNOSIS — K766 Portal hypertension: Secondary | ICD-10-CM

## 2016-08-17 DIAGNOSIS — K7460 Unspecified cirrhosis of liver: Secondary | ICD-10-CM

## 2016-08-17 NOTE — Anesthesia Postprocedure Evaluation (Addendum)
Anesthesia Post Note  Patient: Ashley Savage  Procedure(s) Performed: Procedure(s) (LRB): ESOPHAGOGASTRODUODENOSCOPY (EGD) WITH PROPOFOL (N/A)  Patient location during evaluation: Endoscopy Anesthesia Type: MAC Level of consciousness: awake and alert Pain management: pain level controlled Vital Signs Assessment: post-procedure vital signs reviewed and stable Respiratory status: spontaneous breathing, nonlabored ventilation, respiratory function stable and patient connected to nasal cannula oxygen Cardiovascular status: stable and blood pressure returned to baseline Anesthetic complications: no       Last Vitals:  Vitals:   08/14/16 1124 08/14/16 1239  BP: (!) 104/43 (!) 117/37  Pulse: 64 70  Resp: 12 18  Temp: 36.4 C 36.6 C    Last Pain:  Vitals:   08/14/16 1239  TempSrc: Oral                 Brennen Camper,JAMES TERRILL

## 2016-08-18 ENCOUNTER — Telehealth (HOSPITAL_COMMUNITY): Payer: Self-pay | Admitting: *Deleted

## 2016-08-18 ENCOUNTER — Encounter (HOSPITAL_COMMUNITY)
Admission: RE | Admit: 2016-08-18 | Discharge: 2016-08-18 | Disposition: A | Discharge status: Home / Self Care | Payer: PPO | Source: Ambulatory Visit | Attending: Pulmonary Disease | Admitting: Pulmonary Disease

## 2016-08-18 DIAGNOSIS — J449 Chronic obstructive pulmonary disease, unspecified: Secondary | ICD-10-CM | POA: Insufficient documentation

## 2016-08-18 DIAGNOSIS — J42 Unspecified chronic bronchitis: Secondary | ICD-10-CM

## 2016-08-18 NOTE — Progress Notes (Signed)
Pulmonary Individual Treatment Plan  Patient Details  Name: Ashley Savage MRN: [id number] Date of Birth: Mar 19, 1941 Referring Provider:   [name] Pulmonary Rehab Walk Test from 05/07/2016 in [place]  Referring Provider  Dr. Elsworth Soho      Initial Encounter Date:  Flowsheet Row Pulmonary Rehab Walk Test from 05/07/2016 in [place]  Date  05/07/16  Referring Provider  Dr. Elsworth Soho      Visit Diagnosis: Chronic bronchitis, unspecified chronic bronchitis type ([place])  Patient's Home Medications on Admission:   Current Outpatient Prescriptions:  .  albuterol (ACCUNEB) 1.25 MG/3ML nebulizer solution, Take 1 ampule by nebulization every 6 (six) hours as needed for wheezing. , Disp: , Rfl:  .  albuterol (PROVENTIL HFA;VENTOLIN HFA) 108 (90 Base) MCG/ACT inhaler, Inhale 2 puffs into the lungs every 6 (six) hours as needed for wheezing or shortness of breath. Only dispense Ventolin, Disp: 1 Inhaler, Rfl: 3 .  BD PEN NEEDLE NANO U/F 32G X 4 MM MISC, USE AS DIRECTED WITH LEVEMIR FLEXPEN, Disp: 100 each, Rfl: 0 .  cetirizine (ZYRTEC) 10 MG tablet, Take 10 mg by mouth daily as needed for allergies. , Disp: , Rfl:  .  Cholecalciferol (VITAMIN D3) 2000 units TABS, Take 2,000 Units by mouth every morning., Disp: , Rfl:  .  furosemide (LASIX) 20 MG tablet, TAKE 1 TABLET BY MOUTH TWICE DAILY, Disp: 60 tablet, Rfl: 0 .  insulin aspart (NOVOLOG) 100 UNIT/ML injection, 4 units SQ q lunch daily and prn Sliding scale:  BS <200 no units BS 201-250 use 2 units BS 251-300 use 4 units BS 301-350 use 6 units BS 351-400 use 8 units BS 401-450 use 10 units BS>451 call MD, Disp: 10 mL, Rfl: 3 .  Insulin Detemir (LEVEMIR FLEXPEN) 100 UNIT/ML Pen, Inject 14 Units into the skin daily at 10 pm. (Patient taking differently: Inject 16 Units into the skin daily at 10 pm. ), Disp: 15 mL, Rfl: 5 .  Insulin Syringe-Needle U-100 31G X 5/16" 1 ML MISC, To use w/  Novolog, Disp: 100 each, Rfl: 12 .  lactulose (CHRONULAC) 10 GM/15ML solution, TAKE 45 MLS BY MOUTH TWICE DAILY AS NEEDED FOR MILD CONSTIPATION. (Patient taking differently: Take 70ms in the morning and take 60 mls in the afternoon), Disp: 1892 mL, Rfl: 6 .  LIDOCAINE EX, Apply 1 application topically at bedtime as needed (pain)., Disp: , Rfl:  .  omeprazole (PRILOSEC) 20 MG capsule, TAKE 1 CAPSULE(20 MG) BY MOUTH DAILY, Disp: 90 capsule, Rfl: 0 .  ONE TOUCH ULTRA TEST test strip, USE TWICE DAILY TO CHECK BLOOD SUGAR AS DIRECTED, Disp: 100 each, Rfl: 4 .  Polyvinyl Alcohol (LUBRICANT DROPS OP), Apply 1 drop to eye daily as needed (dry eyes)., Disp: , Rfl:  .  rifaximin (XIFAXAN) 550 MG TABS tablet, Take 1 tablet (550 mg total) by mouth 2 (two) times daily., Disp: 60 tablet, Rfl: 11 .  sodium chloride (OCEAN) 0.65 % SOLN nasal spray, Place 1 spray into both nostrils as needed for congestion., Disp: 30 mL, Rfl: 0 .  SPIRIVA RESPIMAT 2.5 MCG/ACT AERS, Inhale 2.5 mcg into the lungs daily as needed. (Patient taking differently: Inhale 2 puffs into the lungs daily. ), Disp: 3 Inhaler, Rfl: 3 .  spironolactone (ALDACTONE) 50 MG tablet, TAKE 2 TABLETS BY MOUTH DAILY FOR A TOTAL OF 100 MG, Disp: 60 tablet, Rfl: 0 .  tiZANidine (ZANAFLEX) 4 MG tablet, Take 1 tablet (4 mg  total) by mouth every 8 (eight) hours as needed for muscle spasms. (Patient taking differently: Take 2 mg by mouth every 8 (eight) hours as needed for muscle spasms. ), Disp: 30 tablet, Rfl: 1 .  traMADol (ULTRAM-ER) 100 MG 24 hr tablet, Take 1 tablet (100 mg total) by mouth daily as needed for pain., Disp: 30 tablet, Rfl: 1  Past Medical History: Past Medical History:  Diagnosis Date  . Anxiety   . Arthritis of both knees 10/01/2013  . Benign paroxysmal positional vertigo 10/01/2013  . Cancer [facility]) breast ca  right  . COPD (chronic obstructive pulmonary disease) ([place]) 10/01/2013  . Depression   . Diabetes mellitus type 2  . Emphysema   .  Encephalopathy, hepatic ([place]) 06/07/2014  . Esophageal reflux 10/01/2013  . Fall 07/30/2016  . Hyperlipidemia   . Hyperlipidemia, mixed   . Increased ammonia level 11/25/2014  . NASH (nonalcoholic steatohepatitis) 08/26/2015  . Neck pain 10/01/2013  . Neuropathy (HCC)    feet   . Overactive bladder 12/10/2013  . Panic attacks   . Pedal edema 12/10/2013  . Preventative health care 03/08/2016  . Tobacco abuse disorder 02/01/2014    Tobacco Use: History  Smoking Status  . Current Some Day Smoker  . Packs/day: 0.50  . Years: 58.00  . Types: Cigarettes  . Start date: 07/27/1964  Smokeless Tobacco  . Never Used    Comment: 1 pack per week, tobacco infor given 12/30/15    Labs: Recent Review Flowsheet Data    Labs for ITP Cardiac and Pulmonary Rehab Latest Ref Rng & Units 10/15/2014 03/26/2015 07/03/2015 08/26/2015 02/25/2016   Cholestrol 0 - 200 mg/dL - 151 - 157 187   LDLCALC 0 - 99 mg/dL - 93 - 107(H) 108(H)   HDL >39.00 mg/dL - 42.90 - 38.10(L) 55.80   Trlycerides 0.0 - 149.0 mg/dL - 74.0 - 58.0 112.0   Hemoglobin A1c 4.6 - 6.5 % - 6.6(H) 6.5 - 6.3   PHART 7.350 - 7.450 7.426 - - - -   PCO2ART 35.0 - 45.0 mmHg 37.2 - - - -   HCO3 20.0 - 24.0 mEq/L 24.5(H) - - - -   TCO2 0 - 100 mmol/L 26 - - - -   O2SAT % 95.0 - - - -      Capillary Blood Glucose: Lab Results  Component Value Date   GLUCAP 87 08/14/2016   GLUCAP 150 (H) 08/01/2015   GLUCAP 240 (H) 08/01/2015   GLUCAP 154 (H) 08/01/2015   GLUCAP 162 (H) 08/01/2015       POCT Glucose    Row Name 05/14/16 1612 05/19/16 1547 05/21/16 1633 05/26/16 1608 05/28/16 1650     POCT Blood Glucose   Pre-Exercise 173 mg/dL 192 mg/dL 220 mg/dL 171 mg/dL 192 mg/dL   Post-Exercise 142 mg/dL  - 140 mg/dL 131 mg/dL  -   Row Name 06/02/16 1550 06/04/16 1616 06/11/16 1610 06/16/16 1515 06/23/16 1608     POCT Blood Glucose   Pre-Exercise 179 mg/dL 179 mg/dL 120 mg/dL 208 mg/dL 178 mg/dL   Post-Exercise 127 mg/dL 135 mg/dL 167 mg/dL 115 mg/dL 127  mg/dL   Row Name 07/07/16 1542 07/14/16 1622           POCT Blood Glucose   Pre-Exercise 296 mg/dL 110 mg/dL      Post-Exercise 137 mg/dL 115 mg/dL         ADL UCSD:   Pulmonary Function Assessment:     Pulmonary Function Assessment -  05/04/16 1047      Post Bronchodilator Spirometry Results   FVC% 1.85 %   FEV1% 40 %   FEV1/FVC Ratio 54      Exercise Target Goals:    Exercise Program Goal: Individual exercise prescription set with THRR, safety & activity barriers. Participant demonstrates ability to understand and report RPE using BORG scale, to self-measure pulse accurately, and to acknowledge the importance of the exercise prescription.  Exercise Prescription Goal: Starting with aerobic activity 30 plus minutes a day, 3 days per week for initial exercise prescription. Provide home exercise prescription and guidelines that participant acknowledges understanding prior to discharge.  Activity Barriers & Risk Stratification:     Activity Barriers & Cardiac Risk Stratification - 05/04/16 1027      Activity Barriers & Cardiac Risk Stratification   Activity Barriers Arthritis;Deconditioning;Balance Concerns      6 Minute Walk:     6 Minute Walk    Row Name 05/07/16 1618         6 Minute Walk   Phase Initial     Distance 832 feet     Walk Time 6 minutes     # of Rest Breaks 0     MPH 1.57     METS 2.23     RPE 11     Perceived Dyspnea  1     Symptoms Yes (comment)     Comments knee pain     Resting HR 87 bpm     Resting BP 100/62     Max Ex. HR 94 bpm     Max Ex. BP 130/60       Interval HR   Baseline HR 87     1 Minute HR 88     2 Minute HR 94     3 Minute HR 94     4 Minute HR 90     5 Minute HR 92     6 Minute HR 91     2 Minute Post HR 90     Interval Heart Rate? Yes       Interval Oxygen   Interval Oxygen? Yes     Baseline Oxygen Saturation % 94 %     Baseline Liters of Oxygen 9 L     1 Minute Oxygen Saturation % 94 %     1 Minute  Liters of Oxygen 0 L     2 Minute Oxygen Saturation % 92 %     2 Minute Liters of Oxygen 0 L     3 Minute Oxygen Saturation % 92 %     3 Minute Liters of Oxygen 0 L     4 Minute Oxygen Saturation % 94 %     4 Minute Liters of Oxygen 0 L     5 Minute Oxygen Saturation % 95 %     5 Minute Liters of Oxygen 0 L     6 Minute Oxygen Saturation % 96 %     6 Minute Liters of Oxygen 0 L     2 Minute Post Oxygen Saturation % 97 %     2 Minute Post Liters of Oxygen 0 L        Initial Exercise Prescription:     Initial Exercise Prescription - 05/07/16 1600      Date of Initial Exercise RX and Referring Provider   Date 05/07/16   Referring Provider Dr. Elsworth Soho     NuStep   Level 2   Minutes  17   METs 1.5     Arm Ergometer   Level 1   Minutes 17     Track   Laps 5   Minutes 17     Prescription Details   Frequency (times per week) 2   Duration Progress to 45 minutes of aerobic exercise without signs/symptoms of physical distress     Intensity   THRR 40-80% of Max Heartrate 59-118   Ratings of Perceived Exertion 11-13   Perceived Dyspnea 0-4     Progression   Progression Continue progressive overload as per policy without signs/symptoms or physical distress.     Resistance Training   Training Prescription Yes   Weight green bands   Reps 10-12      Perform Capillary Blood Glucose checks as needed.  Exercise Prescription Changes:     Exercise Prescription Changes    Row Name 05/14/16 1600 05/21/16 1600 05/26/16 1600 05/28/16 1647 06/02/16 1500     Exercise Review   Progression  - Yes  - Yes  -     Response to Exercise   Blood Pressure (Admit) 98/42 104/60 102/50 110/50 104/50   Blood Pressure (Exercise) 136/66 110/64 130/50 124/70 100/60   Blood Pressure (Exit) 104/60 114/60 100/40 112/68 100/50   Heart Rate (Admit) 70 bpm 73 bpm 71 bpm 76 bpm 80 bpm   Heart Rate (Exercise) 85 bpm 79 bpm 81 bpm 92 bpm 107 bpm   Heart Rate (Exit) 80 bpm 80 bpm 75 bpm 84 bpm 89 bpm    Oxygen Saturation (Admit) 97 % 94 % 94 % 97 % 93 %   Oxygen Saturation (Exercise) 95 % 95 % 93 % 96 % 88 %   Oxygen Saturation (Exit) 95 % 91 % 91 % 84 % 93 %   Rating of Perceived Exertion (Exercise) '15 13 11 11 13   ' Perceived Dyspnea (Exercise) '2 1 2 2 1   ' Duration Progress to 45 minutes of aerobic exercise without signs/symptoms of physical distress Progress to 45 minutes of aerobic exercise without signs/symptoms of physical distress Progress to 45 minutes of aerobic exercise without signs/symptoms of physical distress Progress to 45 minutes of aerobic exercise without signs/symptoms of physical distress Progress to 45 minutes of aerobic exercise without signs/symptoms of physical distress   Intensity -  40-80% HRR THRR unchanged THRR unchanged THRR unchanged THRR unchanged     Progression   Progression  - Continue to progress workloads to maintain intensity without signs/symptoms of physical distress. Continue to progress workloads to maintain intensity without signs/symptoms of physical distress. Continue to progress workloads to maintain intensity without signs/symptoms of physical distress. Continue to progress workloads to maintain intensity without signs/symptoms of physical distress.     Resistance Training   Training Prescription Yes Yes Yes Yes Yes   Weight green bands  10 min green bands green bands green bands green bands   Reps 10-12 10-12  10 minutes of strength training 10-12  10 minutes of strength training 10-12  10 minutes of strength training 10-12  10 minutes of strength training     Interval Training   Interval Training  - No No No No     Oxygen   Oxygen -  room air  -  -  -  -     NuStep   Level  - 2  - 3 3   Minutes  - '17 17 17 17   ' METs  - 1.3 1.7 2.4 1.5  Arm Ergometer   Level '1 1 1  ' - 1   Minutes '17 17 17  ' - 17     Track   Laps 8  - '8 9 9   ' Minutes 17  - '17 17 17   ' Row Name 06/04/16 1613 06/09/16 1500 06/11/16 1608 06/16/16 1513 06/23/16  1600     Response to Exercise   Blood Pressure (Admit) 110/52 120/50 102/50 121/67 115/63   Blood Pressure (Exercise) 110/62 104/60 120/52 114/70 98/42   Blood Pressure (Exit) 104/60 120/60 118/80 100/62 96/50   Heart Rate (Admit) 76 bpm 79 bpm 85 bpm 74 bpm 83 bpm   Heart Rate (Exercise) 94 bpm 99 bpm 98 bpm 94 bpm 76 bpm   Heart Rate (Exit) 84 bpm 55 bpm 82 bpm 77 bpm 73 bpm   Oxygen Saturation (Admit) 94 % 95 % 91 % 98 % 96 %   Oxygen Saturation (Exercise) 89 % 94 % 91 % 93 % 94 %   Oxygen Saturation (Exit) 95 % 91 % 82 % 95 % 95 %   Rating of Perceived Exertion (Exercise) '13 13 11 15 11   ' Perceived Dyspnea (Exercise) '1 3 1 1 1   ' Duration Progress to 45 minutes of aerobic exercise without signs/symptoms of physical distress Progress to 45 minutes of aerobic exercise without signs/symptoms of physical distress Progress to 45 minutes of aerobic exercise without signs/symptoms of physical distress Progress to 45 minutes of aerobic exercise without signs/symptoms of physical distress Progress to 45 minutes of aerobic exercise without signs/symptoms of physical distress   Intensity THRR unchanged THRR unchanged THRR unchanged THRR unchanged THRR unchanged     Progression   Progression Continue to progress workloads to maintain intensity without signs/symptoms of physical distress. Continue to progress workloads to maintain intensity without signs/symptoms of physical distress. Continue to progress workloads to maintain intensity without signs/symptoms of physical distress. Continue to progress workloads to maintain intensity without signs/symptoms of physical distress. Continue to progress workloads to maintain intensity without signs/symptoms of physical distress.     Resistance Training   Training Prescription Yes Yes Yes Yes Yes   Weight green bands green bands green bands green bands green bands   Reps 10-12  10 minutes of strength training 10-12  10 minutes of strength training 10-12  10  minutes of strength training 10-12  10 minutes of strength training 10-12  10 minutes of strength training     Interval Training   Interval Training No No No No No     NuStep   Level '3 3 3 3 3   ' Minutes '17 17 17 17 17   ' METs  - 1.5 1.5 1.5 -     Arm Ergometer   Level 1 1 - 1 1   Minutes 17 17 - 17 17     Track   Laps  - '7 8 10 9   ' Minutes  - '17 17 17 17   ' [place] Name 06/30/16 1550 07/02/16 1500 07/07/16 1500 07/14/16 1544 07/16/16 1500     Response to Exercise   Blood Pressure (Admit) 104/60 122/64 110/50 116/42 104/50   Blood Pressure (Exercise) 110/60 140/68 106/56 110/58 132/70   Blood Pressure (Exit) 124/56 118/70 114/64 108/62 108/56   Heart Rate (Admit) 80 bpm 77 bpm 83 bpm 73 bpm 74 bpm   Heart Rate (Exercise) 93 bpm 95 bpm 97 bpm 89 bpm 93 bpm   Heart Rate (Exit) 82 bpm 82 bpm 84 bpm  84 bpm 75 bpm   Oxygen Saturation (Admit) 93 % 94 % 94 % 92 % 92 %   Oxygen Saturation (Exercise) 93 % 96 % 91 % 90 % 92 %   Oxygen Saturation (Exit) 96 % 95 % 91 % 93 % 97 %   Rating of Perceived Exertion (Exercise) '11 11 11 13 11   ' Perceived Dyspnea (Exercise) '1 1 1 1 1   ' Duration Progress to 45 minutes of aerobic exercise without signs/symptoms of physical distress Progress to 45 minutes of aerobic exercise without signs/symptoms of physical distress Progress to 45 minutes of aerobic exercise without signs/symptoms of physical distress Progress to 45 minutes of aerobic exercise without signs/symptoms of physical distress Progress to 45 minutes of aerobic exercise without signs/symptoms of physical distress   Intensity THRR unchanged THRR unchanged THRR unchanged THRR unchanged THRR unchanged     Progression   Progression Continue to progress workloads to maintain intensity without signs/symptoms of physical distress. Continue to progress workloads to maintain intensity without signs/symptoms of physical distress. Continue to progress workloads to maintain intensity without signs/symptoms of  physical distress. Continue to progress workloads to maintain intensity without signs/symptoms of physical distress. Continue to progress workloads to maintain intensity without signs/symptoms of physical distress.     Resistance Training   Training Prescription Yes Yes Yes Yes Yes   Weight green bands green bands green bands green bands green bands   Reps 10-12  10 minutes of strength training 10-12  10 minutes of strength training 10-12  10 minutes of strength training 10-12  10 minutes of strength training 10-12  10 minutes of strength training     Interval Training   Interval Training No No No No No     NuStep   Level 3  - '4 4 4   ' Minutes 34  - '17 17 17   ' METs 1.3  - 1.7 1.5 1.6     Arm Ergometer   Level -  -  -  -  -   Minutes -  -  -  -  -     Track   Laps '8 18 18 14 8   ' Minutes 17 34 34 34 17      Exercise Comments:     Exercise Comments    Row Name 06/01/16 1031 06/29/16 1700 07/16/16 0946 08/18/16 0731     Exercise Comments Patient has difficulty with intensity increases. Has only attended 4 exercise sessions. Will cont. to monitor.  Patient has difficulty with intensity increases. Very "spacey" when she is here. Will cont. to monitor.  Patient has difficulty with intensity increases. Very "spacey" when she is here. Will cont. to monitor.  Patient has been out of rehab for one month due to change in medical status. If patient comes back we will be starting from the beginning. Patient may need to be d/c from program until condition improves.       Discharge Exercise Prescription (Final Exercise Prescription Changes):     Exercise Prescription Changes - 07/16/16 1500      Response to Exercise   Blood Pressure (Admit) 104/50   Blood Pressure (Exercise) 132/70   Blood Pressure (Exit) 108/56   Heart Rate (Admit) 74 bpm   Heart Rate (Exercise) 93 bpm   Heart Rate (Exit) 75 bpm   Oxygen Saturation (Admit) 92 %   Oxygen Saturation (Exercise) 92 %   Oxygen  Saturation (Exit) 97 %   Rating of Perceived Exertion (Exercise) 11  Perceived Dyspnea (Exercise) 1   Duration Progress to 45 minutes of aerobic exercise without signs/symptoms of physical distress   Intensity THRR unchanged     Progression   Progression Continue to progress workloads to maintain intensity without signs/symptoms of physical distress.     Resistance Training   Training Prescription Yes   Weight green bands   Reps 10-12  10 minutes of strength training     Interval Training   Interval Training No     NuStep   Level 4   Minutes 17   METs 1.6     Track   Laps 8   Minutes 17       Nutrition:  Target Goals: Understanding of nutrition guidelines, daily intake of sodium <1575m, cholesterol <2054m calories 30% from fat and 7% or less from saturated fats, daily to have 5 or more servings of fruits and vegetables.  Biometrics:     Pre Biometrics - 05/04/16 1037      Pre Biometrics   Grip Strength 29 kg       Nutrition Therapy Plan and Nutrition Goals:   Nutrition Discharge: Rate Your Plate Scores:   Psychosocial: Target Goals: Acknowledge presence or absence of depression, maximize coping skills, provide positive support system. Participant is able to verbalize types and ability to use techniques and skills needed for reducing stress and depression.  Initial Review & Psychosocial Screening:     Initial Psych Review & Screening - 05/04/16 [place]  Not the best support system, but has one   Strains Intra-family strains;Illness and family care strain  sister in wheel chair, neice is helpful, but [place]     Barriers   Psychosocial barriers to participate in program There are no identifiable barriers or psychosocial needs.     Screening Interventions   Interventions Encouraged to exercise      Quality of Life Scores:   PHQ-9: Recent Review Flowsheet Data     [facility] 05/04/2016 02/25/2016 02/08/2015 02/08/2015 12/26/2014   Decreased Interest 0 0 - 0 0   Down, Depressed, Hopeless 0 0 1 - 0   PHQ - 2 Score 0 0 1 0 0      Psychosocial Evaluation and Intervention:     Psychosocial Evaluation - 05/04/16 1044      Psychosocial Evaluation & Interventions   Interventions Encouraged to exercise with the program and follow exercise prescription   Continued Psychosocial Services Needed No      Psychosocial Re-Evaluation:     Psychosocial Re-Evaluation    [place] 05/26/16 1142 05/26/16 1143 06/22/16 1258 07/21/16 0844 08/17/16 1522     Psychosocial Re-Evaluation   Interventions Encouraged to attend Pulmonary Rehabilitation for the exercise  - Encouraged to attend Pulmonary Rehabilitation for the exercise Encouraged to attend Pulmonary Rehabilitation for the exercise  -   Comments  - No psychosocial issues identified at this time No psychosocial issues identified at this time. No psychosocial issues identified. no psychosocial issues identified   Continued Psychosocial Services Needed  - No No No No     Education: Education Goals: Education classes will be provided on a weekly basis, covering required topics. Participant will state understanding/return demonstration of topics presented.  Learning Barriers/Preferences:     Learning Barriers/Preferences - 05/04/16 1031      Learning Barriers/Preferences   Learning Barriers None   Learning Preferences Written Material;Video;Verbal  Instruction;Skilled Demonstration;Pictoral;Individual Instruction;Group Instruction;Computer/Internet;Audio      Education Topics: Risk Factor Reduction:  -Group instruction that is supported by a PowerPoint presentation. Instructor discusses the definition of a risk factor, different risk factors for pulmonary disease, and how the heart and lungs work together.     Nutrition for Pulmonary Patient:  -Group instruction provided by PowerPoint slides,  verbal discussion, and written materials to support subject matter. The instructor gives an explanation and review of healthy diet recommendations, which includes a discussion on weight management, recommendations for fruit and vegetable consumption, as well as protein, fluid, caffeine, fiber, sodium, sugar, and alcohol. Tips for eating when patients are short of breath are discussed. Flowsheet Row PULMONARY REHAB CHRONIC OBSTRUCTIVE PULMONARY DISEASE from 07/16/2016 in [place]  Date  06/04/16 [facility] Eating During the [place]  Educator  RD  Instruction Review Code  2- meets goals/outcomes      Pursed Lip Breathing:  -Group instruction that is supported by demonstration and informational handouts. Instructor discusses the benefits of pursed lip and diaphragmatic breathing and detailed demonstration on how to preform both.     Oxygen Safety:  -Group instruction provided by PowerPoint, verbal discussion, and written material to support subject matter. There is an overview of "What is Oxygen" and "Why do we need it".  Instructor also reviews how to create a safe environment for oxygen use, the importance of using oxygen as prescribed, and the risks of noncompliance. There is a brief discussion on traveling with oxygen and resources the patient may utilize. Flowsheet Row PULMONARY REHAB CHRONIC OBSTRUCTIVE PULMONARY DISEASE from 07/16/2016 in [place]  Date  05/28/16  Educator  rn  Instruction Review Code  2- meets goals/outcomes      Oxygen Equipment:  -Group instruction provided by [employer] Staff utilizing handouts, written materials, and equipment demonstrations. Flowsheet Row PULMONARY REHAB CHRONIC OBSTRUCTIVE PULMONARY DISEASE from 07/16/2016 in [place]  Date  06/11/16  Educator  Rep  Instruction Review Code  2- meets goals/outcomes      Signs and Symptoms:  -Group instruction  provided by written material and verbal discussion to support subject matter. Warning signs and symptoms of infection, stroke, and heart attack are reviewed and when to call the physician/911 reinforced. Tips for preventing the spread of infection discussed. Flowsheet Row PULMONARY REHAB CHRONIC OBSTRUCTIVE PULMONARY DISEASE from 07/16/2016 in [place]  Date  07/02/16  Educator  rn  Instruction Review Code  2- meets goals/outcomes      Advanced Directives:  -Group instruction provided by verbal instruction and written material to support subject matter. Instructor reviews Advanced Directive laws and proper instruction for filling out document.   Pulmonary Video:  -Group video education that reviews the importance of medication and oxygen compliance, exercise, good nutrition, pulmonary hygiene, and pursed lip and diaphragmatic breathing for the pulmonary patient. Flowsheet Row PULMONARY REHAB CHRONIC OBSTRUCTIVE PULMONARY DISEASE from 07/16/2016 in [place]  Date  05/14/16  Educator  video  Instruction Review Code  2- meets goals/outcomes      Exercise for the Pulmonary Patient:  -Group instruction that is supported by a PowerPoint presentation. Instructor discusses benefits of exercise, core components of exercise, frequency, duration, and intensity of an exercise routine, importance of utilizing pulse oximetry during exercise, safety while exercising, and options of places to exercise outside of rehab.   Flowsheet Row PULMONARY REHAB CHRONIC OBSTRUCTIVE  PULMONARY DISEASE from 07/16/2016 in [place]  Date  06/25/16  Educator  EP  Instruction Review Code  2- meets goals/outcomes      Pulmonary Medications:  -Verbally interactive group education provided by instructor with focus on inhaled medications and proper administration.   Anatomy and Physiology of the Respiratory System and  Intimacy:  -Group instruction provided by PowerPoint, verbal discussion, and written material to support subject matter. Instructor reviews respiratory cycle and anatomical components of the respiratory system and their functions. Instructor also reviews differences in obstructive and restrictive respiratory diseases with examples of each. Intimacy, Sex, and Sexuality differences are reviewed with a discussion on how relationships can change when diagnosed with pulmonary disease. Common sexual concerns are reviewed.   Knowledge Questionnaire Score:   Core Components/Risk Factors/Patient Goals at Admission:     Personal Goals and Risk Factors at Admission - 05/04/16 1038      Core Components/Risk Factors/Patient Goals on Admission   Increase Strength and Stamina Yes   Intervention Provide advice, education, support and counseling about physical activity/exercise needs.;Develop an individualized exercise prescription for aerobic and resistive training based on initial evaluation findings, risk stratification, comorbidities and participant's personal goals.   Expected Outcomes Achievement of increased cardiorespiratory fitness and enhanced flexibility, muscular endurance and strength shown through measurements of functional capacity and personal statement of participant.   Tobacco Cessation Yes   Number of packs per day 0-2 cigarettes/day   Intervention Assist the participant in steps to quit. Provide individualized education and counseling about committing to Tobacco Cessation, relapse prevention, and pharmacological support that can be provided by physician.;Advice worker, assist with locating and accessing local/national Quit Smoking programs, and support quit date choice.   Expected Outcomes Short Term: Will quit all tobacco product use, adhering to prevention of relapse plan.   Improve shortness of breath with ADL's Yes   Intervention Provide education, individualized exercise  plan and daily activity instruction to help decrease symptoms of SOB with activities of daily living.   Expected Outcomes Short Term: Achieves a reduction of symptoms when performing activities of daily living.   Develop more efficient breathing techniques such as purse lipped breathing and diaphragmatic breathing; and practicing self-pacing with activity Yes   Intervention Provide education, demonstration and support about specific breathing techniuqes utilized for more efficient breathing. Include techniques such as pursed lipped breathing, diaphragmatic breathing and self-pacing activity.   Expected Outcomes Short Term: Participant will be able to demonstrate and use breathing techniques as needed throughout daily activities.   Increase knowledge of respiratory medications and ability to use respiratory devices properly  Yes      Core Components/Risk Factors/Patient Goals Review:      Goals and Risk Factor Review    Row Name 05/26/16 1139 06/22/16 1255 07/21/16 0842 08/17/16 1520       Core Components/Risk Factors/Patient Goals Review   Personal Goals Review Tobacco Cessation;Increase knowledge of respiratory medications and ability to use respiratory devices properly.;Improve shortness of breath with ADL's;Develop more efficient breathing techniques such as purse lipped breathing and diaphragmatic breathing and practicing self-pacing with activity.;Increase Strength and Stamina Tobacco Cessation;Increase knowledge of respiratory medications and ability to use respiratory devices properly.;Improve shortness of breath with ADL's;Develop more efficient breathing techniques such as purse lipped breathing and diaphragmatic breathing and practicing self-pacing with activity.;Increase Strength and Stamina Tobacco Cessation;Increase knowledge of respiratory medications and ability to use respiratory devices properly.;Develop more efficient breathing techniques such as purse lipped breathing and  diaphragmatic breathing and practicing self-pacing with activity.;Increase Strength and Stamina Tobacco Cessation;Increase knowledge of respiratory medications and ability to use respiratory devices properly.;Develop more efficient breathing techniques such as purse lipped breathing and diaphragmatic breathing and practicing self-pacing with activity.;Increase Strength and Stamina    Review Has attended 3 exercise sessions, too early to see any progress towards goals.  Is not ready to quit smoking at this time, support given. Still is not ready to quit cigarettes, walking 10 LAPS ON TRACK, NUSTEP LEVEL 3, arm ergometer level 2. Slow progress, level 4 on nustep. Had a fall and has sacral contusion, unable to exercise due to fall the last 30 days    Expected Outcomes Expect to see an increase in strength and stamina in the next 30 days Try to encourage smoking cessation in hopes she will move closer to deciding to quit.  Expect strength to increase. Progress levels as tolerated. possible discharge if not able to return this week       Core Components/Risk Factors/Patient Goals at Discharge (Final Review):      Goals and Risk Factor Review - 08/17/16 1520      Core Components/Risk Factors/Patient Goals Review   Personal Goals Review Tobacco Cessation;Increase knowledge of respiratory medications and ability to use respiratory devices properly.;Develop more efficient breathing techniques such as purse lipped breathing and diaphragmatic breathing and practicing self-pacing with activity.;Increase Strength and Stamina   Review Had a fall and has sacral contusion, unable to exercise due to fall the last 30 days   Expected Outcomes possible discharge if not able to return this week      ITP Comments:   Comments: ITP REVIEW Pt is making expected progress toward pulmonary rehab goals after completing 15 sessions. Recommend continued exercise, life style modification, education, and utilization of  breathing techniques to increase stamina and strength and decrease shortness of breath with exertion.

## 2016-08-20 ENCOUNTER — Encounter (HOSPITAL_COMMUNITY): Payer: PPO

## 2016-08-20 ENCOUNTER — Telehealth: Payer: Self-pay | Admitting: Family Medicine

## 2016-08-21 NOTE — Telephone Encounter (Signed)
Ok to put in order for PT.  Thanks!

## 2016-08-25 ENCOUNTER — Encounter (HOSPITAL_COMMUNITY): Payer: PPO

## 2016-08-25 ENCOUNTER — Encounter: Payer: Self-pay | Admitting: Family Medicine

## 2016-08-25 NOTE — Progress Notes (Signed)
Discharge Summary  Patient Details  Name: Ashley Savage MRN: [id number] Date of Birth: 04/17/1941 Referring Provider:   [name] [facility] Walk Test from 05/07/2016 in [place]  Referring Provider  Dr. Elsworth Soho       Number of Visits: 15  Reason for Discharge:  Early Exit:  due to a recent fall injuring her hip and sacrum  Smoking History:  History  Smoking Status  . Current Some Day Smoker  . Packs/day: 0.50  . Years: 58.00  . Types: Cigarettes  . Start date: 07/27/1964  Smokeless Tobacco  . Never Used    Comment: 1 pack per week, tobacco infor given 12/30/15    Diagnosis:  Chronic bronchitis, unspecified chronic bronchitis type ([place])  ADL [facility]:     Pulmonary Assessment Scores    Row Name 05/20/16 0925         ADL [facility]   ADL Phase Entry     SOB Score total 60       CAT Score   CAT Score 17        Initial Exercise Prescription:     Initial Exercise Prescription - 05/07/16 1600      Date of Initial Exercise RX and Referring Provider   Date 05/07/16   Referring Provider Dr. Elsworth Soho     NuStep   Level 2   Minutes 17   METs 1.5     Arm Ergometer   Level 1   Minutes 17     Track   Laps 5   Minutes 17     Prescription Details   Frequency (times per week) 2   Duration Progress to 45 minutes of aerobic exercise without signs/symptoms of physical distress     Intensity   THRR 40-80% of Max Heartrate 59-118   Ratings of Perceived Exertion 11-13   Perceived Dyspnea 0-4     Progression   Progression Continue progressive overload as per policy without signs/symptoms or physical distress.     Resistance Training   Training Prescription Yes   Weight green bands   Reps 10-12      Discharge Exercise Prescription (Final Exercise Prescription Changes):     Exercise Prescription Changes - 07/16/16 1500      Response to Exercise   Blood Pressure (Admit) 104/50   Blood Pressure (Exercise) 132/70   Blood  Pressure (Exit) 108/56   Heart Rate (Admit) 74 bpm   Heart Rate (Exercise) 93 bpm   Heart Rate (Exit) 75 bpm   Oxygen Saturation (Admit) 92 %   Oxygen Saturation (Exercise) 92 %   Oxygen Saturation (Exit) 97 %   Rating of Perceived Exertion (Exercise) 11   Perceived Dyspnea (Exercise) 1   Duration Progress to 45 minutes of aerobic exercise without signs/symptoms of physical distress   Intensity THRR unchanged     Progression   Progression Continue to progress workloads to maintain intensity without signs/symptoms of physical distress.     Resistance Training   Training Prescription Yes   Weight green bands   Reps 10-12  10 minutes of strength training     Interval Training   Interval Training No     NuStep   Level 4   Minutes 17   METs 1.6     Track   Laps 8   Minutes 17      Functional Capacity:     6 Minute Walk    Row Name 05/07/16 [id number]  6 Minute Walk   Phase Initial     Distance 832 feet     Walk Time 6 minutes     # of Rest Breaks 0     MPH 1.57     METS 2.23     RPE 11     Perceived Dyspnea  1     Symptoms Yes (comment)     Comments knee pain     Resting HR 87 bpm     Resting BP 100/62     Max Ex. HR 94 bpm     Max Ex. BP 130/60       Interval HR   Baseline HR 87     1 Minute HR 88     2 Minute HR 94     3 Minute HR 94     4 Minute HR 90     5 Minute HR 92     6 Minute HR 91     2 Minute Post HR 90     Interval Heart Rate? Yes       Interval Oxygen   Interval Oxygen? Yes     Baseline Oxygen Saturation % 94 %     Baseline Liters of Oxygen 9 L     1 Minute Oxygen Saturation % 94 %     1 Minute Liters of Oxygen 0 L     2 Minute Oxygen Saturation % 92 %     2 Minute Liters of Oxygen 0 L     3 Minute Oxygen Saturation % 92 %     3 Minute Liters of Oxygen 0 L     4 Minute Oxygen Saturation % 94 %     4 Minute Liters of Oxygen 0 L     5 Minute Oxygen Saturation % 95 %     5 Minute Liters of Oxygen 0 L     6 Minute Oxygen  Saturation % 96 %     6 Minute Liters of Oxygen 0 L     2 Minute Post Oxygen Saturation % 97 %     2 Minute Post Liters of Oxygen 0 L        Psychological, QOL, Others - Outcomes: PHQ 2/9: Depression screen [facility] 2/9 05/04/2016 02/25/2016 02/08/2015 02/08/2015 12/26/2014  Decreased Interest 0 0 - 0 0  Down, Depressed, Hopeless 0 0 1 - 0  PHQ - 2 Score 0 0 1 0 0  Tired, decreased energy - - - - -  Change in appetite - - - - -  Some recent data might be hidden    Quality of Life:     Quality of Life - 05/20/16 0925      Quality of Life Scores   Health/Function Pre 14.69 %   Socioeconomic Pre 12.42 %   Psych/Spiritual Pre 16.36 %   Family Pre 0 %   GLOBAL Pre 14.62 %      Personal Goals: Goals established at orientation with interventions provided to work toward goal.     Personal Goals and Risk Factors at Admission - 05/04/16 1038      Core Components/Risk Factors/Patient Goals on Admission   Increase Strength and Stamina Yes   Intervention Provide advice, education, support and counseling about physical activity/exercise needs.;Develop an individualized exercise prescription for aerobic and resistive training based on initial evaluation findings, risk stratification, comorbidities and participant's personal goals.   Expected Outcomes Achievement of increased cardiorespiratory fitness and enhanced flexibility, muscular endurance and  strength shown through measurements of functional capacity and personal statement of participant.   Tobacco Cessation Yes   Number of packs per day 0-2 cigarettes/day   Intervention Assist the participant in steps to quit. Provide individualized education and counseling about committing to Tobacco Cessation, relapse prevention, and pharmacological support that can be provided by physician.;Advice worker, assist with locating and accessing local/national Quit Smoking programs, and support quit date choice.   Expected Outcomes Short Term:  Will quit all tobacco product use, adhering to prevention of relapse plan.   Improve shortness of breath with ADL's Yes   Intervention Provide education, individualized exercise plan and daily activity instruction to help decrease symptoms of SOB with activities of daily living.   Expected Outcomes Short Term: Achieves a reduction of symptoms when performing activities of daily living.   Develop more efficient breathing techniques such as purse lipped breathing and diaphragmatic breathing; and practicing self-pacing with activity Yes   Intervention Provide education, demonstration and support about specific breathing techniuqes utilized for more efficient breathing. Include techniques such as pursed lipped breathing, diaphragmatic breathing and self-pacing activity.   Expected Outcomes Short Term: Participant will be able to demonstrate and use breathing techniques as needed throughout daily activities.   Increase knowledge of respiratory medications and ability to use respiratory devices properly  Yes       Personal Goals Discharge:     Goals and Risk Factor Review    Row Name 05/26/16 1139 06/22/16 1255 07/21/16 0842 08/17/16 1520       Core Components/Risk Factors/Patient Goals Review   Personal Goals Review Tobacco Cessation;Increase knowledge of respiratory medications and ability to use respiratory devices properly.;Improve shortness of breath with ADL's;Develop more efficient breathing techniques such as purse lipped breathing and diaphragmatic breathing and practicing self-pacing with activity.;Increase Strength and Stamina Tobacco Cessation;Increase knowledge of respiratory medications and ability to use respiratory devices properly.;Improve shortness of breath with ADL's;Develop more efficient breathing techniques such as purse lipped breathing and diaphragmatic breathing and practicing self-pacing with activity.;Increase Strength and Stamina Tobacco Cessation;Increase knowledge of  respiratory medications and ability to use respiratory devices properly.;Develop more efficient breathing techniques such as purse lipped breathing and diaphragmatic breathing and practicing self-pacing with activity.;Increase Strength and Stamina Tobacco Cessation;Increase knowledge of respiratory medications and ability to use respiratory devices properly.;Develop more efficient breathing techniques such as purse lipped breathing and diaphragmatic breathing and practicing self-pacing with activity.;Increase Strength and Stamina    Review Has attended 3 exercise sessions, too early to see any progress towards goals.  Is not ready to quit smoking at this time, support given. Still is not ready to quit cigarettes, walking 10 LAPS ON TRACK, NUSTEP LEVEL 3, arm ergometer level 2. Slow progress, level 4 on nustep. Had a fall and has sacral contusion, unable to exercise due to fall the last 30 days    Expected Outcomes Expect to see an increase in strength and stamina in the next 30 days Try to encourage smoking cessation in hopes she will move closer to deciding to quit.  Expect strength to increase. Progress levels as tolerated. possible discharge if not able to return this week       Nutrition & Weight - Outcomes:     Pre Biometrics - 05/04/16 1037      Pre Biometrics   Grip Strength 29 kg       Nutrition:   Nutrition Discharge:   Education Questionnaire Score:     Knowledge Questionnaire Score - 05/20/16 [id number]  Knowledge Questionnaire Score   Pre Score 8/13      Goals reviewed with patient; copy given to patient.

## 2016-08-25 NOTE — Addendum Note (Signed)
Encounter addended by: Lance Morin, RN on: 08/25/2016  9:20 AM<BR>    Actions taken: Sign clinical note, Episode resolved

## 2016-08-26 NOTE — Telephone Encounter (Signed)
Order placed

## 2016-08-26 NOTE — Addendum Note (Signed)
Addended by: Sherrie George F on: 08/26/2016 01:30 PM   Modules accepted: Orders

## 2016-08-27 ENCOUNTER — Ambulatory Visit: Payer: PPO | Attending: Family Medicine | Admitting: Physical Therapy

## 2016-08-27 ENCOUNTER — Telehealth: Payer: Self-pay | Admitting: Family Medicine

## 2016-08-27 DIAGNOSIS — M79604 Pain in right leg: Secondary | ICD-10-CM

## 2016-08-27 DIAGNOSIS — M6281 Muscle weakness (generalized): Secondary | ICD-10-CM | POA: Diagnosis not present

## 2016-08-27 DIAGNOSIS — R2681 Unsteadiness on feet: Secondary | ICD-10-CM | POA: Insufficient documentation

## 2016-08-27 DIAGNOSIS — R262 Difficulty in walking, not elsewhere classified: Secondary | ICD-10-CM | POA: Diagnosis not present

## 2016-08-27 NOTE — Telephone Encounter (Signed)
OK to write her a prescription for a rolling walker with 5 inch wheels dx, debility and high fall risk

## 2016-08-27 NOTE — Therapy (Signed)
[facility] [address]  [place], [place], [address] Phone: [phone]   Fax:  [phone]  Physical Therapy Evaluation  Patient Details  Name: Ashley Savage MRN: [id number] Date of Birth: April 20, 1941 Referring Provider: Dr. Karlton Lemon  Encounter Date: 08/27/2016      PT End of Session - 08/27/16 1632    Visit Number 1   Number of Visits 16   Date for PT Re-Evaluation 10/23/16   Authorization Type [employer] VL: Follow Medicare Guidelines   PT Start Time 0245   PT Stop Time 0350   PT Time Calculation (min) 65 min   Activity Tolerance Patient limited by pain   Behavior During Therapy [facility] for tasks assessed/performed  Emotionally labile      Past Medical History:  Diagnosis Date  . Anxiety   . Arthritis of both knees 10/01/2013  . Benign paroxysmal positional vertigo 10/01/2013  . Cancer [facility]) breast ca  right  . COPD (chronic obstructive pulmonary disease) ([place]) 10/01/2013  . Depression   . Diabetes mellitus type 2  . Emphysema   . Encephalopathy, hepatic ([place]) 06/07/2014  . Esophageal reflux 10/01/2013  . Fall 07/30/2016  . Hyperlipidemia   . Hyperlipidemia, mixed   . Increased ammonia level 11/25/2014  . NASH (nonalcoholic steatohepatitis) 08/26/2015  . Neck pain 10/01/2013  . Neuropathy (HCC)    feet   . Overactive bladder 12/10/2013  . Panic attacks   . Pedal edema 12/10/2013  . Preventative health care 03/08/2016  . Tobacco abuse disorder 02/01/2014    Past Surgical History:  Procedure Laterality Date  . APPENDECTOMY  2007  . BREAST SURGERY  2009 right  . CATARACT EXTRACTION     x 2  . ESOPHAGOGASTRODUODENOSCOPY (EGD) WITH PROPOFOL N/A 08/14/2016   Procedure: ESOPHAGOGASTRODUODENOSCOPY (EGD) WITH PROPOFOL;  Surgeon: Mauri Pole, MD;  Location: [facility];  Service: Endoscopy;  Laterality: N/A;  . [place]  . KNEE SURGERY    . MANDIBLE FRACTURE SURGERY    . PILONIDAL CYST  EXCISION    . TONSILLECTOMY      There were no vitals filed for this visit.       Subjective Assessment - 08/27/16 1459    Subjective Pt reports she was out to dinner on 07/19/16 with her family when she was going up a curb and seems to have not placed her foot all the way on the curb. She fell backwards into the parking lot landing on her backside. She states she was a little sore the day after but on 07/21/16 she was having increased R leg and hip pain. She went to her primary care physician who performed an xray and reported no fractures were found. She was then sent to a sports medicine office where they stated she had a deep bruise. She has tried heat, ice, and pain medication which has had no effect. Pt believes she has a pinched nerve.    Limitations Walking;Standing   How long can you stand comfortably? can't stand up without pain   How long can you walk comfortably? difficult with first step   Diagnostic tests X-Ray 07-24-16: No evidence for acute  abnormality.   Patient Stated Goals "want to know what is wrong and get it fixed"   Currently in Pain? Yes   Pain Score --  Current: 11/10 Best: 5-6/10 Worst: 11/10   Pain Location Leg   Pain Orientation Anterior;Medial;Right   Pain Descriptors /  Indicators Aching;Constant;Sharp  "not throbbing"    Pain Type Acute pain   Pain Radiating Towards stays on inside and anterior portion of leg   Pain Onset More than a month ago   Pain Frequency Constant   Aggravating Factors  Walking, Standing   Pain Relieving Factors lay flat on back   Effect of Pain on Daily Activities makes it difficult to get to the bathroom and around the house            [facility] - 08/27/16 1513      Assessment   Medical Diagnosis Lower Back Injury   Referring Provider Dr. Karlton Lemon   Onset Date/Surgical Date 07/19/16     Precautions   Precautions Fall     Balance Screen   Has the patient fallen in the past 6 months Yes   How many  times? 1   Has the patient had a decrease in activity level because of a fear of falling?  Yes   Is the patient reluctant to leave their home because of a fear of falling?  Yes     [place] Private residence   Living Arrangements Alone   Available Help at Discharge Neighbor   Type of [place] Level entry   [place] - single point;Shower seat;Grab bars - tub/shower;Walker - 4 wheels  3 wheeled rollator     Prior Function   Level of Independence Independent   Leisure Was able to go shopping, dinner while only using single point cane     Observation/Other Assessments   Focus on Therapeutic Outcomes (FOTO)  Lumbar Spine 33% (67% limitation) Predicted 56% (44% limitation)      ROM / Strength   AROM / PROM / Strength Strength;PROM     AROM   AROM Assessment Site --   Right/Left Hip --   Right/Left Knee --     PROM   Overall PROM Comments R Hip Flexion was [facility]; Unable to assess hip extension, abduction, adduction, ER, & IR due to pain   PROM Assessment Site Hip   Right/Left Hip Right     Strength   Strength Assessment Site Hip;Knee   Right/Left Hip Right;Left   Right Hip Flexion 2+/5   Right Hip Extension 2+/5   Right Hip ABduction 3-/5   Right Hip ADduction 3-/5   Left Hip Flexion 4-/5   Left Hip Extension 3+/5   Left Hip ABduction 3+/5   Left Hip ADduction 3+/5   Right/Left Knee Right;Left   Right Knee Flexion 3+/5   Right Knee Extension 3/5   Left Knee Flexion 4-/5   Left Knee Extension 4-/5     Palpation   SI assessment  Apparent right posterior inominate rotation of the pelvis on the sacrum     Transfers   Transfers Sit to Stand;Stand to Sit;Stand Pivot Transfers   Sit to Stand 2: Max assist   Stand to Sit 3: Mod assist   Stand Pivot Transfers 2: Max assist   Comments pt required mod to max assist during transfers due to pt leg's buckling especially during pivot transfer from  wheelchair to standard chair.      Ambulation/Gait   Ambulation/Gait Yes   Ambulation/Gait Assistance 4: Min guard   Ambulation Distance (Feet) 45 Feet   Assistive device Rolling walker   Gait Pattern Step-through pattern;Antalgic   Ambulation Surface Level;Indoor   Gait velocity .  79 ft/sec     Standardized Balance Assessment   10 Meter Walk 45.34"                   [facility] Adult PT Treatment/Exercise - 08/27/16 1513      Manual Therapy   Manual Therapy Muscle Energy Technique   Muscle Energy Technique performed muscle energy technique to correct R posterior inominate rotation of pelvis on sacrum with audible cavitation and apparent correction of alignment                PT Education - 08/27/16 1631    Education provided Yes   Education Details Eval Findings, POC, and information on rolling walker for home   Person(s) Educated Patient   Methods Explanation   Comprehension Verbalized understanding          PT Short Term Goals - 08/27/16 1652      PT SHORT TERM GOAL #1   Title Pt will be independent with basic HEP by 09/25/16.   Status New     PT SHORT TERM GOAL #2   Title Pt will be able to demonstrate safe gait pattern with rolling walker by 09/25/16.   Status New           PT Long Term Goals - 08/27/16 1652      PT LONG TERM GOAL #1   Title pt will be independent with advanced HEP by 10/23/16.   Status New     PT LONG TERM GOAL #2   Title Pt will have increased R hip & knee strength to >/= 4-/5 to improve function by 10/23/16.   Status New     PT LONG TERM GOAL #3   Title pt will be able to ambulate community distances with cane (or least restricitive device) safely and independently by 10/23/16.    Status New     PT LONG TERM GOAL #4   Title pt will report at least 50% reduction in R leg and back pain with weight-bearing activities by 10/23/16.   Status New     PT LONG TERM GOAL #5   Title Pt will improve gait speed to at least 1.8 ft/sec  to reduce fall risk by 10/23/16.   Status New               Plan - 08/27/16 1633    Clinical Impression Statement [name] is a 76 year old female who reports to therapy for a high complexity physical therapy evaluation for a lower back injury resulting from a fall. She reports she fell on her backside about 6 weeks ago while trying to step up onto a curb and her R leg pain has progressively gotten worse. She has seen her primary care physician and a sports medicine physician who referred who to physical therapy. Ashley Savage arrived to our office in a wheel chair and with a single point cane. Upon examination, she was able to transfer to another chair with moderate to maximum assistance with noticeable muscle spasms in her R leg. She reports she has been unable to leave her house very often in the past 6 weeks and only has relief with lying flat on her back in bed. Her leg pain only goes down to 5/10 when lying in bed but is otherwise typically around 11/10. She reported she had some lower back irritation but pain in her medial and anterior R thigh were her main complaints. Pelvis assessment in supine demonstrated an apparent R  posterior innominate rotation of the pelvis on the sacrum. After performing a muscle energy technique to correct this rotation audible cavitation was heard with apparent correction of the posterior rotation. Her hip and knee strength were assessed in sitting due to pt inability to get into certain positions due to pain limitations. R hip and knee demonstrated moderate to severe muscle weakness. Pt unsafe to ambulate with cane and reports inability to use her rollator style walkers in house due to pt report that walkers will not fit through bathroom doorway. Therefore pt was recommended to use a rolling walker at home for safe ambulation and was provided information on how to obtain one. Ashley Savage will benefit from skilled therapeutic intervention to improve lower extremity  strength, reduce pain, improve overall functional mobility, increase balance, and improve gait.    Rehab Potential Good   Clinical Impairments Affecting Rehab Potential COPD, DM, high fall risk, H/O BBPV, H/O R knee pain, Neuropathy in feet, inability to be mobile for 6 weeks   PT Frequency 2x / week   PT Duration 8 weeks   PT Treatment/Interventions Patient/family education;ADLs/Self Care Home Management;Therapeutic exercise;Therapeutic activities;Balance training;Gait training;Stair training;Functional mobility training;Manual techniques;Moist Heat;Cryotherapy;Electrical Stimulation;Iontophoresis 4mg /ml Dexamethasone;Neuromuscular re-education;DME Instruction;Taping;Dry needling   PT Next Visit Plan Assess hip & lumbar ROM & flexibility as tolerated; Reassess SIJ alignment; begin gentle strengthening of the lower extremities; gait training to increase tolerance for weight bearing   Consulted and Agree with Plan of Care Patient      Patient will benefit from skilled therapeutic intervention in order to improve the following deficits and impairments:  Pain, Increased muscle spasms, Decreased strength, Decreased mobility, Decreased balance, Decreased activity tolerance, Decreased endurance, Difficulty walking, Postural dysfunction, Cardiopulmonary status limiting activity, Abnormal gait, Decreased knowledge of use of DME, Decreased safety awareness  Visit Diagnosis: Pain in right leg  Muscle weakness (generalized)  Difficulty in walking, not elsewhere classified  Unsteadiness on feet      G-Codes - 09/26/16 1842    Functional Assessment Tool Used Lumbar Spine FOTO = 33% (67% limitation) + Clinical judgement   Functional Limitation Mobility: Walking and moving around   Mobility: Walking and Moving Around Current Status [id number]) At least 80 percent but less than 100 percent impaired, limited or restricted   Mobility: Walking and Moving Around Goal Status [phone]) At least 40 percent but less  than 60 percent impaired, limited or restricted       Problem List Patient Active Problem List   Diagnosis Date Noted  . 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 Portal hypertensive gastropathy   . Lower back injury, initial encounter 08/05/2016  . Fall 07/30/2016  . Preventative health care 03/08/2016  . Muscle spasm 02/25/2016  . Chronic respiratory failure ([place]) 08/29/2015  . NASH (nonalcoholic steatohepatitis) 08/26/2015  . Type 2 diabetes mellitus with hyperglycemia, with long-term current use of insulin ([place])   . Liver cirrhosis secondary to NASH ([place]) 07/31/2015  . Diarrhea 12/30/2014  . Increased ammonia level 11/25/2014  . Diabetes mellitus type 2, controlled ([place]) 10/16/2014  . Superficial bruising 10/04/2014  . Encephalopathy, hepatic ([place]) 06/07/2014  . Knee pain, right 06/07/2014  . Peripheral edema 03/30/2014  . Sun-damaged skin 02/01/2014  . Anxiety and depression 02/01/2014  . Tobacco abuse disorder 02/01/2014  . Medicare annual wellness visit, subsequent 02/01/2014  . Pedal edema 12/10/2013  . Overactive bladder 12/10/2013  . Abdominal aortic aneurysm ([place]) 10/01/2013  . Arthritis of both knees 10/01/2013  . Benign paroxysmal positional  vertigo 10/01/2013  . Neck pain 10/01/2013  . Esophageal reflux 10/01/2013  . Thrombocytopenia ([place]) 03/17/2012  . 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   . Hyperlipidemia, mixed   . Breast cancer [facility])     Lauralee Evener, SPT 08/27/2016, 6:46 PM  [facility] [address]  [place], [place], [address] Phone: [phone]   Fax:  [phone]  Percival Spanish, PT, MPT 08/27/16, 6:46 PM  [facility] [address]  [place], [place], [address] Phone: [phone]   Fax:  [phone]   Name: KARRIN STACY MRN: [id number] Date of Birth: Jan 28, 1941

## 2016-08-27 NOTE — Telephone Encounter (Signed)
Caller name: Monasia Relation to pt: self Call back [phone] Pharmacy:  Reason for call :Pt came in office stating that is needing a Conservation officer, nature with 5 inch wheels rx. Pt would like to get it at [facility] that is located near our office. Please advise pt when rx is ready.

## 2016-08-28 NOTE — Telephone Encounter (Signed)
Wrote script as PCP instructed/PCP signed and put at the front desk to pickup. Called the patient left a detailed message to pickup at the front desk.

## 2016-08-31 ENCOUNTER — Other Ambulatory Visit: Payer: Self-pay | Admitting: Family Medicine

## 2016-08-31 ENCOUNTER — Ambulatory Visit: Payer: PPO | Admitting: Physical Therapy

## 2016-08-31 DIAGNOSIS — M79604 Pain in right leg: Secondary | ICD-10-CM

## 2016-08-31 DIAGNOSIS — R203 Hyperesthesia: Secondary | ICD-10-CM | POA: Diagnosis not present

## 2016-08-31 DIAGNOSIS — R2681 Unsteadiness on feet: Secondary | ICD-10-CM

## 2016-08-31 DIAGNOSIS — M6281 Muscle weakness (generalized): Secondary | ICD-10-CM

## 2016-08-31 DIAGNOSIS — J449 Chronic obstructive pulmonary disease, unspecified: Secondary | ICD-10-CM | POA: Diagnosis not present

## 2016-08-31 DIAGNOSIS — J441 Chronic obstructive pulmonary disease with (acute) exacerbation: Secondary | ICD-10-CM | POA: Diagnosis not present

## 2016-08-31 DIAGNOSIS — R262 Difficulty in walking, not elsewhere classified: Secondary | ICD-10-CM

## 2016-08-31 NOTE — Therapy (Signed)
[facility] [address]  [place], [place], [address] Phone: [phone]   Fax:  [phone]  Physical Therapy Treatment  Patient Details  Name: Ashley Savage MRN: [id number] Date of Birth: 06-21-1941 Referring Provider: Dr. Karlton Lemon  Encounter Date: 08/31/2016      PT End of Session - 08/31/16 1407    Visit Number 2   Number of Visits 16   Date for PT Re-Evaluation 10/23/16   Authorization Type HT Advantage VL: Follow Medicare Guidelines   PT Start Time 1407   PT Stop Time 1459   PT Time Calculation (min) 52 min   Activity Tolerance Patient limited by pain;Patient tolerated treatment well   Behavior During Therapy [facility] for tasks assessed/performed  Emotionally labile (tearful due to not feeling she is meeting other peoples expectations)      Past Medical History:  Diagnosis Date  . Anxiety   . Arthritis of both knees 10/01/2013  . Benign paroxysmal positional vertigo 10/01/2013  . Cancer [facility]) breast ca  right  . COPD (chronic obstructive pulmonary disease) ([place]) 10/01/2013  . Depression   . Diabetes mellitus type 2  . Emphysema   . Encephalopathy, hepatic ([place]) 06/07/2014  . Esophageal reflux 10/01/2013  . Fall 07/30/2016  . Hyperlipidemia   . Hyperlipidemia, mixed   . Increased ammonia level 11/25/2014  . NASH (nonalcoholic steatohepatitis) 08/26/2015  . Neck pain 10/01/2013  . Neuropathy (HCC)    feet   . Overactive bladder 12/10/2013  . Panic attacks   . Pedal edema 12/10/2013  . Preventative health care 03/08/2016  . Tobacco abuse disorder 02/01/2014    Past Surgical History:  Procedure Laterality Date  . APPENDECTOMY  2007  . BREAST SURGERY  2009 right  . CATARACT EXTRACTION     x 2  . ESOPHAGOGASTRODUODENOSCOPY (EGD) WITH PROPOFOL N/A 08/14/2016   Procedure: ESOPHAGOGASTRODUODENOSCOPY (EGD) WITH PROPOFOL;  Surgeon: Mauri Pole, MD;  [facility];  Service: Endoscopy;  Laterality: N/A;   . [place]  . KNEE SURGERY    . MANDIBLE FRACTURE SURGERY    . PILONIDAL CYST EXCISION    . TONSILLECTOMY      There were no vitals filed for this visit.      Subjective Assessment - 08/31/16 1411    Subjective Pt noting some relief from MET until Sat with pain increasing again Sun & today.   Patient Stated Goals "want to know what is wrong and get it fixed"   Currently in Pain? Yes   Pain Score --  0/10 at rest, up to 10/10 with WB'ing   Pain Location Leg   Pain Orientation Right;Anterior;Medial  groin & anterior thigh   Pain Descriptors / Indicators --  grabbing "like a charlie horse"   Pain Type Acute pain   Pain Radiating Towards pain down to knee at times   Pain Onset More than a month ago   Pain Frequency Constant   Effect of Pain on Daily Activities pain limits standing and walking tolerance                         [facility] Adult PT Treatment/Exercise - 08/31/16 1407      Transfers   Sit to Stand 4: Min guard   Sit to Stand Details Verbal cues for safe use of DME/AE;Verbal cues for technique;Verbal cues for sequencing;Verbal cues for precautions/safety   Stand to Sit 4:  Min guard   Stand to Sit Details (indicate cue type and reason) Verbal cues for safe use of DME/AE;Verbal cues for technique;Verbal cues for sequencing;Verbal cues for precautions/safety   Transfer Cueing Pt instructed to push from seat rather than pull on RW with sit to stand. VC's to turn with RW and align with seat, before releasing RW and reaching back for seat with stand to sit.     Ambulation/Gait   Ambulation/Gait Assistance 4: Min guard   Ambulation/Gait Assistance Details Cues for upright posture and step placement with heel strike btw rear legs of RW   Ambulation Distance (Feet) 40 Feet  total, 15' + 25'   Assistive device Rolling walker   Gait Pattern Step-to pattern;Trunk flexed;Decreased weight shift to right;Antalgic   Ambulation Surface Level;Indoor      Exercises   Exercises Knee/Hip     Lumbar Exercises: Supine   Ab Set 5 reps;5 seconds   AB Set Limitations 2 sets, 2nd set with pelvic tilt     Knee/Hip Exercises: Stretches   Piriformis Stretch Both;Right;30 seconds;2 reps;1 rep   Piriformis Stretch Limitations B KTOS x 2 reps , R figure 4 x 1 rep (poor tolerance)   Other Knee/Hip Stretches B hip adductor butterfly stretch 2x30"     Knee/Hip Exercises: Aerobic   Nustep lvl 2 x 5'     Knee/Hip Exercises: Supine   Hip Adduction Isometric Both;5 reps   Hip Adduction Isometric Limitations 5" ball squeeze   Bridges Both;5 reps   Bridges Limitations 3" hold     Modalities   Modalities Moist Heat     Moist Heat Therapy   Number Minutes Moist Heat 10 Minutes   Moist Heat Location Hip  R groin/anterior thigh     Manual Therapy   Manual Therapy Muscle Energy Technique;Soft tissue mobilization   Soft tissue mobilization R adductors/medial quads   Muscle Energy Technique Performed MET to correct apparent R posterior inominate rotation of pelvis on sacrum, followed by pelvic shotgun.  No cavitation heard/felt, but alignment appears corrected after MET.                  PT Short Term Goals - 08/31/16 1451      PT SHORT TERM GOAL #1   Title Pt will be independent with basic HEP by 09/25/16.   Status On-going     PT SHORT TERM GOAL #2   Title Pt will be able to demonstrate safe gait pattern with rolling walker by 09/25/16.   Status On-going           PT Long Term Goals - 08/31/16 1451      PT LONG TERM GOAL #1   Title pt will be independent with advanced HEP by 10/23/16.   Status On-going     PT LONG TERM GOAL #2   Title Pt will have increased R hip & knee strength to >/= 4-/5 to improve function by 10/23/16.   Status On-going     PT LONG TERM GOAL #3   Title pt will be able to ambulate community distances with cane (or least restricitive device) safely and independently by 10/23/16.    Status On-going     PT  LONG TERM GOAL #4   Title pt will report at least 50% reduction in R leg and back pain with weight-bearing activities by 10/23/16.   Status On-going     PT LONG TERM GOAL #5   Title Pt will improve gait speed to at  least 1.8 ft/sec to reduce fall risk by 10/23/16.   Status On-going               Plan - 08/31/16 1447    Clinical Impression Statement Pt noting some temporary partial relief of pain after MET on eval visit lasting until Sat, with pain increasing as of yesterday and today. Reassessment of SIJ revealed return of apprarent R posterior innominate rotation. although less pronounced than on eval, which appeared to correct wit MET. LE flexibility assessment reveals good overall flexibility other than minor tightness in R hip adductors & piriformis. Introduced basic core/proximal LE strengthening with good pt tolerance, but deferred creation of HEP to determine delayed repsonse to exercises. Will hopefully be able to issue basic HEP at next visit.   Rehab Potential Good   Clinical Impairments Affecting Rehab Potential COPD, DM, high fall risk, H/O BBPV, H/O R knee pain, Neuropathy in feet, inability to be mobile for 6 weeks   PT Treatment/Interventions Patient/family education;ADLs/Self Care Home Management;Therapeutic exercise;Therapeutic activities;Balance training;Gait training;Stair training;Functional mobility training;Manual techniques;Moist Heat;Cryotherapy;Electrical Stimulation;Iontophoresis 22m/ml Dexamethasone;Neuromuscular re-education;DME Instruction;Taping;Dry needling   PT Next Visit Plan Assess lumbar ROM as tolerated; Reassess SIJ alignment; gentle strengthening of the core & lower extremities; gait training to increase tolerance for weight bearing   Consulted and Agree with Plan of Care Patient      Patient will benefit from skilled therapeutic intervention in order to improve the following deficits and impairments:  Pain, Increased muscle spasms, Decreased strength,  Decreased mobility, Decreased balance, Decreased activity tolerance, Decreased endurance, Difficulty walking, Postural dysfunction, Cardiopulmonary status limiting activity, Abnormal gait, Decreased knowledge of use of DME, Decreased safety awareness  Visit Diagnosis: Pain in right leg  Muscle weakness (generalized)  Difficulty in walking, not elsewhere classified  Unsteadiness on feet     Problem List Patient Active Problem List   Diagnosis Date Noted  . 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 Portal hypertensive gastropathy   . Lower back injury, initial encounter 08/05/2016  . Fall 07/30/2016  . Preventative health care 03/08/2016  . Muscle spasm 02/25/2016  . Chronic respiratory failure ([place] 08/29/2015  . NASH (nonalcoholic steatohepatitis) 08/26/2015  . Type 2 diabetes mellitus with hyperglycemia, with long-term current use of insulin ([place]   . Liver cirrhosis secondary to NASH ([place] 07/31/2015  . Diarrhea 12/30/2014  . Increased ammonia level 11/25/2014  . Diabetes mellitus type 2, controlled ([place] 10/16/2014  . Superficial bruising 10/04/2014  . Encephalopathy, hepatic ([place] 06/07/2014  . Knee pain, right 06/07/2014  . Peripheral edema 03/30/2014  . Sun-damaged skin 02/01/2014  . Anxiety and depression 02/01/2014  . Tobacco abuse disorder 02/01/2014  . Medicare annual wellness visit, subsequent 02/01/2014  . Pedal edema 12/10/2013  . Overactive bladder 12/10/2013  . Abdominal aortic aneurysm ([place] 10/01/2013  . Arthritis of both knees 10/01/2013  . Benign paroxysmal positional vertigo 10/01/2013  . Neck pain 10/01/2013  . Esophageal reflux 10/01/2013  . Thrombocytopenia ([place] 03/17/2012  . 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   . Hyperlipidemia, mixed   . Breast cancer ([facility]     JPercival Spanish PT, MPT 08/31/2016, 3:46 PM  [facility] [place] [address]Phone: [phone]  Fax:  [phone] Name: MRIMA BLIZZARDMRN: [id number] of Birth: [birth year]

## 2016-09-02 ENCOUNTER — Encounter: Payer: Self-pay | Admitting: Physician Assistant

## 2016-09-03 ENCOUNTER — Ambulatory Visit: Payer: PPO | Admitting: Physical Therapy

## 2016-09-03 ENCOUNTER — Encounter: Payer: Self-pay | Admitting: Physician Assistant

## 2016-09-03 DIAGNOSIS — M79604 Pain in right leg: Secondary | ICD-10-CM | POA: Diagnosis not present

## 2016-09-03 DIAGNOSIS — R2681 Unsteadiness on feet: Secondary | ICD-10-CM

## 2016-09-03 DIAGNOSIS — M6281 Muscle weakness (generalized): Secondary | ICD-10-CM

## 2016-09-03 DIAGNOSIS — R262 Difficulty in walking, not elsewhere classified: Secondary | ICD-10-CM

## 2016-09-03 NOTE — Therapy (Signed)
[facility] [address]  [place], [place], [address] Phone: [phone]   Fax:  [phone]  Physical Therapy Treatment  Patient Details  Name: Ashley Savage MRN: [id number] Date of Birth: [birth year] Referring Provider: Dr. Karlton Lemon  Encounter Date: 09/03/2016      PT End of Session - 09/03/16 1726    Visit Number 3   Number of Visits 16   Date for PT Re-Evaluation 10/23/16   Authorization Type [employer] VL: Follow Medicare Guidelines   PT Start Time [id number]   PT Stop Time 1533   PT Time Calculation (min) 46 min   Activity Tolerance Patient limited by pain;Patient tolerated treatment well   Behavior During Therapy [facility] for tasks assessed/performed      Past Medical History:  Diagnosis Date  . Anxiety   . Arthritis of both knees 10/01/2013  . Benign paroxysmal positional vertigo 10/01/2013  . Cancer [facility]) breast ca  right  . COPD (chronic obstructive pulmonary disease) ([place]) 10/01/2013  . Depression   . Diabetes mellitus type 2  . Emphysema   . Encephalopathy, hepatic ([place]) 06/07/2014  . Esophageal reflux 10/01/2013  . Fall 07/30/2016  . Hyperlipidemia   . Hyperlipidemia, mixed   . Increased ammonia level 11/25/2014  . NASH (nonalcoholic steatohepatitis) 08/26/2015  . Neck pain 10/01/2013  . Neuropathy (HCC)    feet   . Overactive bladder 12/10/2013  . Panic attacks   . Pedal edema 12/10/2013  . Preventative health care 03/08/2016  . Tobacco abuse disorder 02/01/2014    Past Surgical History:  Procedure Laterality Date  . APPENDECTOMY  2007  . BREAST SURGERY  2009 right  . CATARACT EXTRACTION     x 2  . ESOPHAGOGASTRODUODENOSCOPY (EGD) WITH PROPOFOL N/A 08/14/2016   Procedure: ESOPHAGOGASTRODUODENOSCOPY (EGD) WITH PROPOFOL;  Surgeon: Mauri Pole, MD;  Location: [facility];  Service: Endoscopy;  Laterality: N/A;  . [place]  . KNEE SURGERY    . MANDIBLE FRACTURE SURGERY    .  PILONIDAL CYST EXCISION    . TONSILLECTOMY      There were no vitals filed for this visit.      Subjective Assessment - 09/03/16 1452    Subjective Pt reports she was a little sore after last session but each day seems to be improving.    Patient Stated Goals "want to know what is wrong and get it fixed"   Currently in Pain? Yes   Pain Score 6    Pain Location Leg   Pain Orientation Right;Anterior;Medial                         [facility] Adult PT Treatment/Exercise - 09/03/16 1450      Ambulation/Gait   Ambulation/Gait Assistance 4: Min guard   Ambulation/Gait Assistance Details Cues for upright posture & heel strike to remain within RW   Ambulation Distance (Feet) 400 Feet   Assistive device Rolling walker   Gait Pattern Step-through pattern;Decreased weight shift to right   Ambulation Surface Level;Indoor     Lumbar Exercises: Supine   Ab Set 5 reps;5 seconds   Bridge Limitations   Bridge Limitations Bridge with belt for iso abduction; increased pain upon attempt D/C   Other Supine Lumbar Exercises Supine Marches; increased pain upon attempt D/C     Knee/Hip Exercises: Aerobic   Nustep lv 2 x 6'     Knee/Hip Exercises: Seated  Long Arc [employer] Limitations   Long Arc Quad Limitations LAQ with ball squeeze; increased pain upon attmept D/C   [employer] increased pain upon attempt D/C   Clamshell with [employer] Yellow  10 reps on each leg   Marching Both;10 reps     Knee/Hip Exercises: Supine   Bridges Both;5 reps   Bridges Limitations 3" holds   Bridges with [employer] Both;5 reps  3" holds                  PT Short Term Goals - 08/31/16 1451      PT SHORT TERM GOAL #1   Title Pt will be independent with basic HEP by 09/25/16.   Status On-going     PT SHORT TERM GOAL #2   Title Pt will be able to demonstrate safe gait pattern with rolling walker by 09/25/16.   Status On-going           PT Long Term Goals - 08/31/16 1451      PT  LONG TERM GOAL #1   Title pt will be independent with advanced HEP by 10/23/16.   Status On-going     PT LONG TERM GOAL #2   Title Pt will have increased R hip & knee strength to >/= 4-/5 to improve function by 10/23/16.   Status On-going     PT LONG TERM GOAL #3   Title pt will be able to ambulate community distances with cane (or least restricitive device) safely and independently by 10/23/16.    Status On-going     PT LONG TERM GOAL #4   Title pt will report at least 50% reduction in R leg and back pain with weight-bearing activities by 10/23/16.   Status On-going     PT LONG TERM GOAL #5   Title Pt will improve gait speed to at least 1.8 ft/sec to reduce fall risk by 10/23/16.   Status On-going               Plan - 09/03/16 1727    Clinical Impression Statement Pt is reporting progress with R leg pain. She reports she no longer constantly has the pain in the front of the leg but is still having significant pain in the groin/pubic symphysis area. She arrived today using her new rolling walker instead of the wheelchair. The RW was fitted correctly for the patient during today's session. Today's focus was on gentle core/LE strengthening and gait training. Pt was able to tolerate 400 feet of ambulation with her rolling walker with a break at about 260 feet due to fatigue. Pt continues to be limited in supine exercises due to increased pain but is able to better tolerate sitting exercises. She is reporting less pain with gait. She appears steadier on her feet and is requiring less assistance with transfers and gait. pt reported no increase in pain following today's session. She will continue to benefit from skilled therapy to improve core/LE strength, decrease R LE pain, and improve overall activity tolerance.    Rehab Potential Good   Clinical Impairments Affecting Rehab Potential COPD, DM, high fall risk, H/O BBPV, H/O R knee pain, Neuropathy in feet, inability to be mobile for 6 weeks    PT Treatment/Interventions Patient/family education;ADLs/Self Care Home Management;Therapeutic exercise;Therapeutic activities;Balance training;Gait training;Stair training;Functional mobility training;Manual techniques;Moist Heat;Cryotherapy;Electrical Stimulation;Iontophoresis 4mg /ml Dexamethasone;Neuromuscular re-education;DME Instruction;Taping;Dry needling   PT Next Visit Plan Initial HEP; Assess lumbar ROM as tolerated; Reassess SIJ alignment PRN; gentle strengthening of the core &  lower extremities; gait training to increase tolerance for weight bearing   Consulted and Agree with Plan of Care Patient      Patient will benefit from skilled therapeutic intervention in order to improve the following deficits and impairments:  Pain, Increased muscle spasms, Decreased strength, Decreased mobility, Decreased balance, Decreased activity tolerance, Decreased endurance, Difficulty walking, Postural dysfunction, Cardiopulmonary status limiting activity, Abnormal gait, Decreased knowledge of use of DME, Decreased safety awareness  Visit Diagnosis: Pain in right leg  Muscle weakness (generalized)  Difficulty in walking, not elsewhere classified  Unsteadiness on feet     Problem List Patient Active Problem List   Diagnosis Date Noted  . 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 Portal hypertensive gastropathy   . Lower back injury, initial encounter 08/05/2016  . Fall 07/30/2016  . Preventative health care 03/08/2016  . Muscle spasm 02/25/2016  . Chronic respiratory failure ([place]) 08/29/2015  . NASH (nonalcoholic steatohepatitis) 08/26/2015  . Type 2 diabetes mellitus with hyperglycemia, with long-term current use of insulin ([place])   . Liver cirrhosis secondary to NASH ([place]) 07/31/2015  . Diarrhea 12/30/2014  . Increased ammonia level 11/25/2014  . Diabetes mellitus type 2, controlled ([place]) 10/16/2014  . Superficial bruising 10/04/2014  . Encephalopathy, hepatic ([place]) 06/07/2014  . Knee  pain, right 06/07/2014  . Peripheral edema 03/30/2014  . Sun-damaged skin 02/01/2014  . Anxiety and depression 02/01/2014  . Tobacco abuse disorder 02/01/2014  . Medicare annual wellness visit, subsequent 02/01/2014  . Pedal edema 12/10/2013  . Overactive bladder 12/10/2013  . Abdominal aortic aneurysm ([place]) 10/01/2013  . Arthritis of both knees 10/01/2013  . Benign paroxysmal positional vertigo 10/01/2013  . Neck pain 10/01/2013  . Esophageal reflux 10/01/2013  . Thrombocytopenia ([place]) 03/17/2012  . 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   . Hyperlipidemia, mixed   . Breast cancer [facility] [place] [facility] [place] [facility])     Lauralee Evener, SPT 09/03/2016, 6:13 PM  [facility] [address]  [place], [place], [address] Phone: [phone]   Fax:  [phone]  Name: Ashley Savage MRN: [id number] Date of Birth: June 24, 1941

## 2016-09-04 ENCOUNTER — Other Ambulatory Visit: Payer: Self-pay

## 2016-09-04 MED ORDER — RIFAXIMIN 550 MG PO TABS: 550.0000 mg | ORAL_TABLET | Freq: Two times a day (BID) | ORAL | 11 refills | Status: DC

## 2016-09-06 ENCOUNTER — Emergency Department (HOSPITAL_BASED_OUTPATIENT_CLINIC_OR_DEPARTMENT_OTHER)
Admission: EM | Admit: 2016-09-06 | Discharge: 2016-09-06 | Disposition: A | Discharge status: Home / Self Care | Payer: PPO | Attending: Emergency Medicine | Admitting: Emergency Medicine

## 2016-09-06 ENCOUNTER — Emergency Department (HOSPITAL_BASED_OUTPATIENT_CLINIC_OR_DEPARTMENT_OTHER): Payer: PPO

## 2016-09-06 ENCOUNTER — Encounter (HOSPITAL_BASED_OUTPATIENT_CLINIC_OR_DEPARTMENT_OTHER): Payer: Self-pay | Admitting: Emergency Medicine

## 2016-09-06 DIAGNOSIS — J449 Chronic obstructive pulmonary disease, unspecified: Secondary | ICD-10-CM | POA: Insufficient documentation

## 2016-09-06 DIAGNOSIS — K7682 Hepatic encephalopathy: Secondary | ICD-10-CM

## 2016-09-06 DIAGNOSIS — F17210 Nicotine dependence, cigarettes, uncomplicated: Secondary | ICD-10-CM | POA: Insufficient documentation

## 2016-09-06 DIAGNOSIS — E119 Type 2 diabetes mellitus without complications: Secondary | ICD-10-CM | POA: Insufficient documentation

## 2016-09-06 DIAGNOSIS — K7290 Hepatic failure, unspecified without coma: Secondary | ICD-10-CM | POA: Insufficient documentation

## 2016-09-06 DIAGNOSIS — Z794 Long term (current) use of insulin: Secondary | ICD-10-CM | POA: Diagnosis not present

## 2016-09-06 DIAGNOSIS — Z79899 Other long term (current) drug therapy: Secondary | ICD-10-CM | POA: Insufficient documentation

## 2016-09-06 DIAGNOSIS — R0602 Shortness of breath: Secondary | ICD-10-CM | POA: Diagnosis not present

## 2016-09-06 DIAGNOSIS — R410 Disorientation, unspecified: Secondary | ICD-10-CM | POA: Diagnosis not present

## 2016-09-06 DIAGNOSIS — Z859 Personal history of malignant neoplasm, unspecified: Secondary | ICD-10-CM | POA: Diagnosis not present

## 2016-09-06 LAB — CBC WITH DIFFERENTIAL/PLATELET
BASOS ABS: 0.1 10*3/uL (ref 0.0–0.1)
Basophils Relative: 1 %
Eosinophils Absolute: 0.2 10*3/uL (ref 0.0–0.7)
Eosinophils Relative: 2 %
HEMATOCRIT: 42.8 % (ref 36.0–46.0)
Hemoglobin: 15.2 g/dL — ABNORMAL HIGH (ref 12.0–15.0)
LYMPHS PCT: 23 %
Lymphs Abs: 1.7 10*3/uL (ref 0.7–4.0)
MCH: 33.2 pg (ref 26.0–34.0)
MCHC: 35.5 g/dL (ref 30.0–36.0)
MCV: 93.4 fL (ref 78.0–100.0)
Monocytes Absolute: 1 10*3/uL (ref 0.1–1.0)
Monocytes Relative: 13 %
NEUTROS ABS: 4.7 10*3/uL (ref 1.7–7.7)
NEUTROS PCT: 61 %
Platelets: 149 10*3/uL — ABNORMAL LOW (ref 150–400)
RBC: 4.58 MIL/uL (ref 3.87–5.11)
RDW: 13.9 % (ref 11.5–15.5)
WBC: 7.6 10*3/uL (ref 4.0–10.5)

## 2016-09-06 LAB — COMPREHENSIVE METABOLIC PANEL
ALT: 26 U/L (ref 14–54)
AST: 50 U/L — AB (ref 15–41)
Albumin: 3 g/dL — ABNORMAL LOW (ref 3.5–5.0)
Alkaline Phosphatase: 249 U/L — ABNORMAL HIGH (ref 38–126)
Anion gap: 8 (ref 5–15)
BUN: 22 mg/dL — AB (ref 6–20)
CHLORIDE: 102 mmol/L (ref 101–111)
CO2: 26 mmol/L (ref 22–32)
CREATININE: 0.83 mg/dL (ref 0.44–1.00)
Calcium: 9.5 mg/dL (ref 8.9–10.3)
GFR calc Af Amer: >60 mL/min (ref >60)
Glucose, Bld: 130 mg/dL — ABNORMAL HIGH (ref 65–99)
Potassium: 4.6 mmol/L (ref 3.5–5.1)
Sodium: 136 mmol/L (ref 135–145)
TOTAL PROTEIN: 6.1 g/dL — AB (ref 6.5–8.1)
Total Bilirubin: 3 mg/dL — ABNORMAL HIGH (ref 0.3–1.2)

## 2016-09-06 LAB — AMMONIA: AMMONIA: 52 umol/L — AB (ref 9–35)

## 2016-09-06 NOTE — ED Triage Notes (Signed)
Patient reports that she started to feel "very sleepy" when she left her sisters house at about 5 pm - friend at the bedside states that the patient called her about 1 hour ago and was acutly confused and having speech like "she was drunk" - patient states that she took 2 extra doses of lactulose to help her with the confusion ' - patient reports that this is what her MD states to do in these situations. The patient reports that she feels much better but was scared to go to sleep without coming in

## 2016-09-06 NOTE — ED Notes (Signed)
Patient denies pain and is resting comfortably.  

## 2016-09-06 NOTE — ED Notes (Signed)
States," I have been confused today and I think my ammonia level is up" Took 2 doses of lactose PTA. Oriented x 3

## 2016-09-06 NOTE — ED Provider Notes (Signed)
[facility] DEPT [facility] Provider Note: Ashley Spurling, MD, FACEP  CSN: [id number] MRN: [id number] ARRIVAL: 09/06/16 at 2111 ROOM: MH05/MH05 By signing my name below, I, Ashley Savage, attest that this documentation has been prepared under the direction and in the presence of [name], MD . Electronically Signed: Dyke Savage, Scribe. 09/06/2016. 11:13 PM.   CHIEF COMPLAINT  Altered Mental Status  HISTORY OF PRESENT ILLNESS  Ashley Savage is a 76 y.o. female with a history of hepatic encephalopathy who presents to the Emergency Department complaining of sudden onset, gradually improving confusion which began at 4 PM today. She states she was driving home and felt nauseated, prompting her to lie down upon arrival home. After lying down for about an hour, pt felt confused. She then took 2 extra doses of lactulose as per her protocol for confusion and has had three subsequent bowel movements. She reports significant relief after taking the lactulose and states her confusion has resolved. Pt has no other complaints or symptoms at this time.   Past Medical History:  Diagnosis Date  . Anxiety   . Arthritis of both knees 10/01/2013  . Benign paroxysmal positional vertigo 10/01/2013  . Cancer [facility]) breast ca  right  . COPD (chronic obstructive pulmonary disease) ([place]) 10/01/2013  . Depression   . Diabetes mellitus type 2  . Emphysema   . Encephalopathy, hepatic ([place]) 06/07/2014  . Esophageal reflux 10/01/2013  . Fall 07/30/2016  . Hyperlipidemia   . Hyperlipidemia, mixed   . Increased ammonia level 11/25/2014  . NASH (nonalcoholic steatohepatitis) 08/26/2015  . Neck pain 10/01/2013  . Neuropathy (HCC)    feet   . Overactive bladder 12/10/2013  . Panic attacks   . Pedal edema 12/10/2013  . Preventative health care 03/08/2016  . Tobacco abuse disorder 02/01/2014    Past Surgical History:  Procedure Laterality Date  . APPENDECTOMY  2007  . BREAST SURGERY  2009 right  . CATARACT EXTRACTION     x  2  . ESOPHAGOGASTRODUODENOSCOPY (EGD) WITH PROPOFOL N/A 08/14/2016   Procedure: ESOPHAGOGASTRODUODENOSCOPY (EGD) WITH PROPOFOL;  Surgeon: Mauri Pole, MD;  [facility];  Service: Endoscopy;  Laterality: N/A;  . [place]  . KNEE SURGERY    . MANDIBLE FRACTURE SURGERY    . PILONIDAL CYST EXCISION    . TONSILLECTOMY      Family History  Problem Relation Age of Onset  . Heart failure Father   . COPD Father   . Arthritis Father 62  . Stroke Mother   . Arthritis Mother 82  . Hyperlipidemia Mother   . Hypertension Mother   . Diabetes Mother   . Diabetes Sister   . Breast cancer    . Breast cancer Maternal Aunt   . Asthma Maternal Aunt   . Birth defects Maternal Aunt   . Alcohol abuse Maternal Uncle     Social History  Substance Use Topics  . Smoking status: Current Some Day Smoker    Packs/day: 0.50    Years: 58.00    Types: Cigarettes    Start date: 07/27/1964  . Smokeless tobacco: Never Used     Comment: 1 pack per week, tobacco infor given 12/30/15  . Alcohol use No    Prior to Admission medications   Medication Sig Start Date End Date Taking? Authorizing Provider  albuterol (ACCUNEB) 1.25 MG/3ML nebulizer solution Take 1 ampule by nebulization every 6 (six) hours as needed for wheezing.     Historical  Provider, MD  albuterol (PROVENTIL HFA;VENTOLIN HFA) 108 (90 Base) MCG/ACT inhaler Inhale 2 puffs into the lungs every 6 (six) hours as needed for wheezing or shortness of breath. Only dispense Ventolin 02/25/16   Mosie Lukes, MD  BD PEN NEEDLE NANO U/F 32G X 4 MM MISC USE AS DIRECTED WITH LEVEMIR FLEXPEN 08/17/16   Mosie Lukes, MD  cetirizine (ZYRTEC) 10 MG tablet Take 10 mg by mouth daily as needed for allergies.     Historical Provider, MD  Cholecalciferol (VITAMIN D3) 2000 units TABS Take 2,000 Units by mouth every morning.    Historical Provider, MD  furosemide (LASIX) 20 MG tablet TAKE 1 TABLET BY MOUTH TWICE DAILY 08/14/16   Mauri Pole, MD  insulin aspart (NOVOLOG) 100 UNIT/ML injection 4 units SQ q lunch daily and prn Sliding scale:  BS <200 no units BS 201-250 use 2 units BS 251-300 use 4 units BS 301-350 use 6 units BS 351-400 use 8 units BS 401-450 use 10 units BS>451 call MD 02/25/16   Mosie Lukes, MD  Insulin Syringe-Needle U-100 31G X 5/16" 1 ML MISC To use w/ Novolog 07/24/16   Mosie Lukes, MD  lactulose (CHRONULAC) 10 GM/15ML solution TAKE 45 MLS BY MOUTH TWICE DAILY AS NEEDED FOR MILD CONSTIPATION. Patient taking differently: Take 73mls in the morning and take 60 mls in the afternoon 03/02/16   Mosie Lukes, MD  LEVEMIR FLEXTOUCH 100 UNIT/ML Pen INJECT 14 UNITS UNDER THE SKIN EVERY DAY AT 10PM 08/31/16   Mosie Lukes, MD  LIDOCAINE EX Apply 1 application topically at bedtime as needed (pain).    Historical Provider, MD  omeprazole (PRILOSEC) 20 MG capsule TAKE 1 CAPSULE(20 MG) BY MOUTH DAILY 08/14/16   Mosie Lukes, MD  ONE TOUCH ULTRA TEST test strip USE TWICE DAILY TO CHECK BLOOD SUGAR AS DIRECTED 12/03/15   Mosie Lukes, MD  Polyvinyl Alcohol (LUBRICANT DROPS OP) Apply 1 drop to eye daily as needed (dry eyes).    Historical Provider, MD  rifaximin (XIFAXAN) 550 MG TABS tablet Take 1 tablet (550 mg total) by mouth 2 (two) times daily. 09/04/16   Amy S Esterwood, PA-C  sodium chloride (OCEAN) 0.65 % SOLN nasal spray Place 1 spray into both nostrils as needed for congestion. 08/01/15   Barton Dubois, MD  SPIRIVA RESPIMAT 2.5 MCG/ACT AERS Inhale 2.5 mcg into the lungs daily as needed. Patient taking differently: Inhale 2 puffs into the lungs daily.  07/03/16   Rigoberto Noel, MD  spironolactone (ALDACTONE) 50 MG tablet TAKE 2 TABLETS BY MOUTH DAILY FOR A TOTAL OF 100 MG 08/17/16   Kavitha Nandigam V, MD  tiZANidine (ZANAFLEX) 4 MG tablet Take 1 tablet (4 mg total) by mouth every 8 (eight) hours as needed for muscle spasms. Patient taking differently: Take 2 mg by mouth every 8 (eight) hours as needed for  muscle spasms.  07/24/16   Mosie Lukes, MD  traMADol (ULTRAM-ER) 100 MG 24 hr tablet Take 1 tablet (100 mg total) by mouth daily as needed for pain. 08/14/16   Mauri Pole, MD    Allergies Citalopram; Erythromycin; Glimepiride; Prednisone; and Versed [midazolam]   REVIEW OF SYSTEMS  Negative except as noted here or in the History of Present Illness.   PHYSICAL EXAMINATION  Initial Vital Signs Blood pressure 149/74, pulse 89, temperature 98.3 F (36.8 C), temperature source Oral, resp. rate 18, weight 156 lb (70.8 kg), SpO2 95 %.  Examination General: Well-developed, well-nourished female in no acute distress; appearance consistent with age of record HENT: normocephalic; atraumatic Eyes: pupils equal, round and reactive to light; extraocular muscles intact Neck: supple Heart: regular rate and rhythm Lungs: clear to auscultation bilaterally Abdomen: soft; nondistended; nontender; no masses or hepatosplenomegaly; bowel sounds hyperactive Extremities: No deformity; full range of motion; pulses normal; +1 pitting edema to lower legs Neurologic: Awake, alert and oriented x 4; motor function intact in all extremities and symmetric; no facial droop Skin: Warm and dry Psychiatric: Normal mood and affect   RESULTS  Summary of this visit's results, reviewed by myself:   EKG Interpretation  Date/Time:    Ventricular Rate:    PR Interval:    QRS Duration:   QT Interval:    QTC Calculation:   R Axis:     Text Interpretation:        Laboratory Studies: Results for orders placed or performed during the hospital encounter of 09/06/16 (from the past 24 hour(s))  CBC with Differential     Status: Abnormal   Collection Time: 09/06/16  9:55 PM  Result Value Ref Range   WBC 7.6 4.0 - 10.5 K/uL   RBC 4.58 3.87 - 5.11 MIL/uL   Hemoglobin 15.2 (H) 12.0 - 15.0 g/dL   HCT 42.8 36.0 - 46.0 %   MCV 93.4 78.0 - 100.0 fL   MCH 33.2 26.0 - 34.0 pg   MCHC 35.5 30.0 - 36.0 g/dL    RDW 13.9 11.5 - 15.5 %   Platelets 149 (L) 150 - 400 K/uL   Neutrophils Relative % 61 %   Neutro Abs 4.7 1.7 - 7.7 K/uL   Lymphocytes Relative 23 %   Lymphs Abs 1.7 0.7 - 4.0 K/uL   Monocytes Relative 13 %   Monocytes Absolute 1.0 0.1 - 1.0 K/uL   Eosinophils Relative 2 %   Eosinophils Absolute 0.2 0.0 - 0.7 K/uL   Basophils Relative 1 %   Basophils Absolute 0.1 0.0 - 0.1 K/uL  Comprehensive metabolic panel     Status: Abnormal   Collection Time: 09/06/16  9:55 PM  Result Value Ref Range   Sodium 136 135 - 145 mmol/L   Potassium 4.6 3.5 - 5.1 mmol/L   Chloride 102 101 - 111 mmol/L   CO2 26 22 - 32 mmol/L   Glucose, Bld 130 (H) 65 - 99 mg/dL   BUN 22 (H) 6 - 20 mg/dL   Creatinine, Ser 0.83 0.44 - 1.00 mg/dL   Calcium 9.5 8.9 - 10.3 mg/dL   Total Protein 6.1 (L) 6.5 - 8.1 g/dL   Albumin 3.0 (L) 3.5 - 5.0 g/dL   AST 50 (H) 15 - 41 U/L   ALT 26 14 - 54 U/L   Alkaline Phosphatase 249 (H) 38 - 126 U/L   Total Bilirubin 3.0 (H) 0.3 - 1.2 mg/dL   GFR calc non Af Amer >60 >60 mL/min   GFR calc Af Amer >60 >60 mL/min   Anion gap 8 5 - 15  Ammonia     Status: Abnormal   Collection Time: 09/06/16  9:55 PM  Result Value Ref Range   Ammonia 52 (H) 9 - 35 umol/L   Imaging Studies: Dg Chest 2 View  Result Date: 09/06/2016 CLINICAL DATA:  76 y/o  F; shortness of breath. EXAM: CHEST  2 VIEW COMPARISON:  07/30/2015 chest radiograph FINDINGS: Stable heart size and mediastinal contours are within normal limits. Both lungs are clear. Mild stable hyperexpansion  of lungs. The visualized skeletal structures are unremarkable. Right upper quadrant surgical clips. Right mastectomy. IMPRESSION: No active cardiopulmonary disease. Electronically Signed   By: Kristine Garbe M.D.   On: 09/06/2016 22:35    ED COURSE  Nursing notes and initial vitals signs, including pulse oximetry, reviewed.  Vitals:   09/06/16 2123 09/06/16 2124  BP: 149/74   Pulse: 89   Resp: 18   Temp: 98.3 F (36.8 C)    TempSrc: Oral   SpO2: 95%   Weight:  156 lb (70.8 kg)   11:23 PM Patient's mental status is normal at this time. She feels she is returned to her baseline. We will discharge her home and she will follow-up with her primary care physician. She understands she is never to drive when she is feeling confused. She has a friend who will take her home.  PROCEDURES    ED DIAGNOSES     ICD-9-CM ICD-10-CM   1. Hepatic encephalopathy (HCC) 572.2 K72.90    I personally performed the services described in this documentation, which was scribed in my presence. The recorded information has been reviewed and is accurate.    [name], MD 09/06/16 [phone]

## 2016-09-07 ENCOUNTER — Encounter: Payer: Self-pay | Admitting: Physician Assistant

## 2016-09-08 ENCOUNTER — Ambulatory Visit: Payer: PPO | Admitting: Physical Therapy

## 2016-09-08 ENCOUNTER — Encounter: Payer: Self-pay | Admitting: Physician Assistant

## 2016-09-08 DIAGNOSIS — M6281 Muscle weakness (generalized): Secondary | ICD-10-CM

## 2016-09-08 DIAGNOSIS — R2681 Unsteadiness on feet: Secondary | ICD-10-CM

## 2016-09-08 DIAGNOSIS — M79604 Pain in right leg: Secondary | ICD-10-CM | POA: Diagnosis not present

## 2016-09-08 DIAGNOSIS — R262 Difficulty in walking, not elsewhere classified: Secondary | ICD-10-CM

## 2016-09-08 NOTE — Therapy (Signed)
[facility] [address]  [place], [place], [address] Phone: [phone]   Fax:  [phone]  Physical Therapy Treatment  Patient Details  Name: Ashley Savage MRN: [id number] Date of Birth: 1941/07/19 Referring Provider: Dr. Karlton Lemon  Encounter Date: 09/08/2016      PT End of Session - 09/08/16 1400    Visit Number 4   Number of Visits 16   Date for PT Re-Evaluation 10/23/16   Authorization Type [employer] VL: Follow Medicare Guidelines   PT Start Time 1400   PT Stop Time 1444   PT Time Calculation (min) 44 min   Activity Tolerance Patient limited by pain;Patient tolerated treatment well   Behavior During Therapy [facility] for tasks assessed/performed      Past Medical History:  Diagnosis Date  . Anxiety   . Arthritis of both knees 10/01/2013  . Benign paroxysmal positional vertigo 10/01/2013  . Cancer [facility]) breast ca  right  . COPD (chronic obstructive pulmonary disease) ([place]) 10/01/2013  . Depression   . Diabetes mellitus type 2  . Emphysema   . Encephalopathy, hepatic ([place]) 06/07/2014  . Esophageal reflux 10/01/2013  . Fall 07/30/2016  . Hyperlipidemia   . Hyperlipidemia, mixed   . Increased ammonia level 11/25/2014  . NASH (nonalcoholic steatohepatitis) 08/26/2015  . Neck pain 10/01/2013  . Neuropathy (HCC)    feet   . Overactive bladder 12/10/2013  . Panic attacks   . Pedal edema 12/10/2013  . Preventative health care 03/08/2016  . Tobacco abuse disorder 02/01/2014    Past Surgical History:  Procedure Laterality Date  . APPENDECTOMY  2007  . BREAST SURGERY  2009 right  . CATARACT EXTRACTION     x 2  . ESOPHAGOGASTRODUODENOSCOPY (EGD) WITH PROPOFOL N/A 08/14/2016   Procedure: ESOPHAGOGASTRODUODENOSCOPY (EGD) WITH PROPOFOL;  Surgeon: Mauri Pole, MD;  Location: [facility];  Service: Endoscopy;  Laterality: N/A;  . [place]  . KNEE SURGERY    . MANDIBLE FRACTURE SURGERY    .  PILONIDAL CYST EXCISION    . TONSILLECTOMY      There were no vitals filed for this visit.      Subjective Assessment - 09/08/16 1403    Subjective Pt reports pain worst upon initiation of movement then eases as she continues to move. Has recently noted a popping sensation in her back/hip when rolling or changing positions.   Patient Stated Goals "want to know what is wrong and get it fixed"   Currently in Pain? Yes   Pain Score 5   8/10 upon initiation of movement   Pain Location Groin   Pain Orientation Right;Anterior;Medial   Pain Descriptors / Indicators Tightness   Pain Type Acute pain   Pain Radiating Towards no longer radiates   Pain Onset More than a month ago   Pain Frequency Constant   Aggravating Factors  initiation of movement   Pain Relieving Factors rest   Effect of Pain on Daily Activities limits standing and walking tolerance                         [facility] Adult PT Treatment/Exercise - 09/08/16 1400      Ambulation/Gait   Ambulation/Gait Assistance 5: Supervision   Ambulation/Gait Assistance Details Cues for upright posture & heel strike to remain within rear legs of RW   Assistive device Rolling walker   Gait Pattern Step-through pattern;Decreased weight shift  to right   Ambulation Surface Unlevel;Indoor     Knee/Hip Exercises: Aerobic   Nustep lvl 3 x 7'     Knee/Hip Exercises: Standing   Heel Raises Both;10 reps;3 seconds   Heel Raises Limitations UE support on RW   Hip Extension Both;10 reps;Knee straight   Extension Limitations UE support on RW, fatigue noted with stance on R LE     Knee/Hip Exercises: Seated   Ball Squeeze 10 x 5"   Clamshell with TheraBand Red  B clam 10 x 3"   Marching Both;10 reps   Marching Limitations red TB looped around lower thighs     Knee/Hip Exercises: Supine   Bridges with Clamshell Both;10 reps  hip ABD isometric with red TB, 3" hold   Other Supine Knee/Hip Exercises Hooklying alt hip ABD/ER with  red TB 10x3"     Manual Therapy   Manual Therapy Muscle Energy Technique   Muscle Energy Technique Performed MET to correct apparent R anterior inominate rotation of pelvis on sacrum, followed by pelvic shotgun.  Cavitation heard/felt during pelvic shotgun with alignment appeared to be corrected after MET.                PT Education - 09/08/16 1450    Education provided Yes   Education Details Initial HEP with red TB provided   Person(s) Educated Patient   Methods Explanation;Demonstration;Handout   Comprehension Verbalized understanding;Returned demonstration;Need further instruction          PT Short Term Goals - 08/31/16 1451      PT SHORT TERM GOAL #1   Title Pt will be independent with basic HEP by 09/25/16.   Status On-going     PT SHORT TERM GOAL #2   Title Pt will be able to demonstrate safe gait pattern with rolling walker by 09/25/16.   Status On-going           PT Long Term Goals - 08/31/16 1451      PT LONG TERM GOAL #1   Title pt will be independent with advanced HEP by 10/23/16.   Status On-going     PT LONG TERM GOAL #2   Title Pt will have increased R hip & knee strength to >/= 4-/5 to improve function by 10/23/16.   Status On-going     PT LONG TERM GOAL #3   Title pt will be able to ambulate community distances with cane (or least restricitive device) safely and independently by 10/23/16.    Status On-going     PT LONG TERM GOAL #4   Title pt will report at least 50% reduction in R leg and back pain with weight-bearing activities by 10/23/16.   Status On-going     PT LONG TERM GOAL #5   Title Pt will improve gait speed to at least 1.8 ft/sec to reduce fall risk by 10/23/16.   Status On-going               Plan - 09/08/16 1444    Clinical Impression Statement Pt reporting continued improvement with R LE pain, with pain now mostly localized to R groin and pubic bone area. Pt demonstrating decreased need for external assist to move R LE;  now able to lift R LE onto mat table w/o UE assist or propping on L foot. SIJ reassessment today did reveal apparent anterior innominate rotation of pelvis on sacrum, but able to correct to neutral alignment with MET. Pt demonstrating improving exercise tolerance with decreased pain  intererence, therefore able to provide initial HEP today, but pt cautioned to defer exercises if pain increases on attempt at home.   Rehab Potential Good   Clinical Impairments Affecting Rehab Potential COPD, DM, high fall risk, H/O BBPV, H/O R knee pain, Neuropathy in feet, inability to be mobile for 6 weeks   PT Treatment/Interventions Patient/family education;ADLs/Self Care Home Management;Therapeutic exercise;Therapeutic activities;Balance training;Gait training;Stair training;Functional mobility training;Manual techniques;Moist Heat;Cryotherapy;Electrical Stimulation;Iontophoresis 57m/ml Dexamethasone;Neuromuscular re-education;DME Instruction;Taping;Dry needling   PT Next Visit Plan Assess response to initial HEP; Reassess SIJ alignment PRN; gentle strengthening of the core & lower extremities; gait training to increase tolerance for weight bearing   Consulted and Agree with Plan of Care Patient      Patient will benefit from skilled therapeutic intervention in order to improve the following deficits and impairments:  Pain, Increased muscle spasms, Decreased strength, Decreased mobility, Decreased balance, Decreased activity tolerance, Decreased endurance, Difficulty walking, Postural dysfunction, Cardiopulmonary status limiting activity, Abnormal gait, Decreased knowledge of use of DME, Decreased safety awareness  Visit Diagnosis: Pain in right leg  Muscle weakness (generalized)  Difficulty in walking, not elsewhere classified  Unsteadiness on feet     Problem List Patient Active Problem List   Diagnosis Date Noted  . 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 Portal hypertensive gastropathy   . Lower  back injury, initial encounter 08/05/2016  . Fall 07/30/2016  . Preventative health care 03/08/2016  . Muscle spasm 02/25/2016  . Chronic respiratory failure ([place] 08/29/2015  . NASH (nonalcoholic steatohepatitis) 08/26/2015  . Type 2 diabetes mellitus with hyperglycemia, with long-term current use of insulin ([place]   . Liver cirrhosis secondary to NASH ([place] 07/31/2015  . Diarrhea 12/30/2014  . Increased ammonia level 11/25/2014  . Diabetes mellitus type 2, controlled ([place] 10/16/2014  . Superficial bruising 10/04/2014  . Encephalopathy, hepatic ([place] 06/07/2014  . Knee pain, right 06/07/2014  . Peripheral edema 03/30/2014  . Sun-damaged skin 02/01/2014  . Anxiety and depression 02/01/2014  . Tobacco abuse disorder 02/01/2014  . Medicare annual wellness visit, subsequent 02/01/2014  . Pedal edema 12/10/2013  . Overactive bladder 12/10/2013  . Abdominal aortic aneurysm ([place] 10/01/2013  . Arthritis of both knees 10/01/2013  . Benign paroxysmal positional vertigo 10/01/2013  . Neck pain 10/01/2013  . Esophageal reflux 10/01/2013  . Thrombocytopenia ([place] 03/17/2012  . 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   . Hyperlipidemia, mixed   . Breast cancer ([facility]     JPercival Spanish PT, MPT 09/08/2016, 3:04 PM  [facility] [place] [address]Phone: [phone]  Fax:  [phone] Name: MBRIANTE LOVEALLMRN: [id number] of Birth: 907-Sep-1942

## 2016-09-09 ENCOUNTER — Encounter: Payer: Self-pay | Admitting: Physician Assistant

## 2016-09-10 ENCOUNTER — Ambulatory Visit: Payer: PPO | Admitting: Physical Therapy

## 2016-09-10 DIAGNOSIS — M6281 Muscle weakness (generalized): Secondary | ICD-10-CM

## 2016-09-10 DIAGNOSIS — M79604 Pain in right leg: Secondary | ICD-10-CM | POA: Diagnosis not present

## 2016-09-10 DIAGNOSIS — R2681 Unsteadiness on feet: Secondary | ICD-10-CM

## 2016-09-10 DIAGNOSIS — R262 Difficulty in walking, not elsewhere classified: Secondary | ICD-10-CM

## 2016-09-10 NOTE — Therapy (Signed)
[facility] [address]  [place], [place], [address] Phone: [phone]   Fax:  [phone]  Physical Therapy Treatment  Patient Details  Name: Ashley Savage MRN: [id number] Date of Birth: Aug 28, 1940 Referring Provider: Dr. Karlton Lemon  Encounter Date: 09/10/2016      PT End of Session - 09/10/16 1445    Visit Number 5   Number of Visits 16   Date for PT Re-Evaluation 10/23/16   Authorization Type HT Advantage VL: Follow Medicare Guidelines   PT Start Time [id number]   PT Stop Time 1526   PT Time Calculation (min) 48 min   Activity Tolerance Patient limited by pain;Patient tolerated treatment well   Behavior During Therapy [facility] for tasks assessed/performed      Past Medical History:  Diagnosis Date  . Anxiety   . Arthritis of both knees 10/01/2013  . Benign paroxysmal positional vertigo 10/01/2013  . Cancer [facility]) breast ca  right  . COPD (chronic obstructive pulmonary disease) ([place]) 10/01/2013  . Depression   . Diabetes mellitus type 2  . Emphysema   . Encephalopathy, hepatic ([place]) 06/07/2014  . Esophageal reflux 10/01/2013  . Fall 07/30/2016  . Hyperlipidemia   . Hyperlipidemia, mixed   . Increased ammonia level 11/25/2014  . NASH (nonalcoholic steatohepatitis) 08/26/2015  . Neck pain 10/01/2013  . Neuropathy (HCC)    feet   . Overactive bladder 12/10/2013  . Panic attacks   . Pedal edema 12/10/2013  . Preventative health care 03/08/2016  . Tobacco abuse disorder 02/01/2014    Past Surgical History:  Procedure Laterality Date  . APPENDECTOMY  2007  . BREAST SURGERY  2009 right  . CATARACT EXTRACTION     x 2  . ESOPHAGOGASTRODUODENOSCOPY (EGD) WITH PROPOFOL N/A 08/14/2016   Procedure: ESOPHAGOGASTRODUODENOSCOPY (EGD) WITH PROPOFOL;  Surgeon: Mauri Pole, MD;  [facility];  Service: Endoscopy;  Laterality: N/A;  . [place]  . KNEE SURGERY    . MANDIBLE FRACTURE SURGERY    .  PILONIDAL CYST EXCISION    . TONSILLECTOMY      There were no vitals filed for this visit.      Subjective Assessment - 09/10/16 1443    Subjective Pt reports she is still having some pain in her groin and on her "butt". Her leg is feeling much better. She states she drove herself to therapy today & also got gas.    Patient Stated Goals "want to know what is wrong and get it fixed"   Currently in Pain? No/denies   Pain Score 0-No pain   Pain Location Groin   Pain Orientation Right;Anterior;Medial                         [facility] Adult PT Treatment/Exercise - 09/10/16 1436      Knee/Hip Exercises: Stretches   Piriformis Stretch Limitations R KTOS; poor tolerance due to knee pain     Knee/Hip Exercises: Aerobic   Nustep lvl 3 x 6'     Knee/Hip Exercises: Standing   Heel Raises Both;15 reps;3 seconds   Heel Raises Limitations UE support on back of chair   Hip Flexion Both;10 reps;Knee straight   Hip Flexion Limitations UE support on back of chair   Hip Abduction Both;10 reps;Knee straight   Abduction Limitations UE support on back of chair.   Hip Extension Both;10 reps;Knee straight   Extension Limitations UE support  on back of chair   Other Standing Knee Exercises Marches; Both legs; 15 reps; UE support on walker     Knee/Hip Exercises: Seated   Long Arc Quad Limitations   Long Arc Quad Limitations Continues to increase R knee pain   Ball Squeeze 10 x 5"    Clamshell with TheraBand Red  Unilateral clam shell 10 reps x 3" holds   Marching Both;15 reps   Marching Limitations Red TB     Knee/Hip Exercises: Supine   Bridges Both;15 reps   Bridges Limitations 3" holds   Other Supine Knee/Hip Exercises Hooklying alt hip ABD/ER with red TB 15 x3"   Other Supine Knee/Hip Exercises Marches; 10 reps each side                PT Education - 09/10/16 1545    Education provided Yes   Education Details seated marches with Red TB   Person(s) Educated  Patient   Methods Explanation;Demonstration;Handout   Comprehension Verbalized understanding;Returned demonstration          PT Short Term Goals - 08/31/16 1451      PT SHORT TERM GOAL #1   Title Pt will be independent with basic HEP by 09/25/16.   Status On-going     PT SHORT TERM GOAL #2   Title Pt will be able to demonstrate safe gait pattern with rolling walker by 09/25/16.   Status On-going           PT Long Term Goals - 08/31/16 1451      PT LONG TERM GOAL #1   Title pt will be independent with advanced HEP by 10/23/16.   Status On-going     PT LONG TERM GOAL #2   Title Pt will have increased R hip & knee strength to >/= 4-/5 to improve function by 10/23/16.   Status On-going     PT LONG TERM GOAL #3   Title pt will be able to ambulate community distances with cane (or least restricitive device) safely and independently by 10/23/16.    Status On-going     PT LONG TERM GOAL #4   Title pt will report at least 50% reduction in R leg and back pain with weight-bearing activities by 10/23/16.   Status On-going     PT LONG TERM GOAL #5   Title Pt will improve gait speed to at least 1.8 ft/sec to reduce fall risk by 10/23/16.   Status On-going               Plan - 09/10/16 1536    Clinical Impression Statement Pt is reporting that her R anterior leg & back pain is not really bothering her anymore but she is still having some pain in the groin with certain movements like standing up from sitting down. She continues to report improvements with her beginning to become more independent again. She was able to tolerate progressions in her core/LE exercises as well as weight bearing activities. She does continue to be limited with the exercises she can perform due to her R knee pain. She states her knee was in this much pain prior to her injury. She will continue to benefit from skilled therapy to improve her tolerance for weight bearing activities, increase LE strength, and  improve overall LE function.    Rehab Potential Good   Clinical Impairments Affecting Rehab Potential COPD, DM, high fall risk, H/O BBPV, H/O R knee pain, Neuropathy in feet, inability to be mobile for  6 weeks   PT Treatment/Interventions Patient/family education;ADLs/Self Care Home Management;Therapeutic exercise;Therapeutic activities;Balance training;Gait training;Stair training;Functional mobility training;Manual techniques;Moist Heat;Cryotherapy;Electrical Stimulation;Iontophoresis 4mg /ml Dexamethasone;Neuromuscular re-education;DME Instruction;Taping;Dry needling   PT Next Visit Plan Reassess SIJ alignment PRN; gentle strengthening of the core & lower extremities; gait training to increase tolerance for weight bearing; manual therapy & modalities PRN.   Consulted and Agree with Plan of Care Patient      Patient will benefit from skilled therapeutic intervention in order to improve the following deficits and impairments:  Pain, Increased muscle spasms, Decreased strength, Decreased mobility, Decreased balance, Decreased activity tolerance, Decreased endurance, Difficulty walking, Postural dysfunction, Cardiopulmonary status limiting activity, Abnormal gait, Decreased knowledge of use of DME, Decreased safety awareness  Visit Diagnosis: Pain in right leg  Muscle weakness (generalized)  Difficulty in walking, not elsewhere classified  Unsteadiness on feet     Problem List Patient Active Problem List   Diagnosis Date Noted  . 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 Portal hypertensive gastropathy   . Lower back injury, initial encounter 08/05/2016  . Fall 07/30/2016  . Preventative health care 03/08/2016  . Muscle spasm 02/25/2016  . Chronic respiratory failure ([place]) 08/29/2015  . NASH (nonalcoholic steatohepatitis) 08/26/2015  . Type 2 diabetes mellitus with hyperglycemia, with long-term current use of insulin ([place])   . Liver cirrhosis secondary to NASH ([place]) 07/31/2015  .  Diarrhea 12/30/2014  . Increased ammonia level 11/25/2014  . Diabetes mellitus type 2, controlled ([place]) 10/16/2014  . Superficial bruising 10/04/2014  . Encephalopathy, hepatic ([place]) 06/07/2014  . Knee pain, right 06/07/2014  . Peripheral edema 03/30/2014  . Sun-damaged skin 02/01/2014  . Anxiety and depression 02/01/2014  . Tobacco abuse disorder 02/01/2014  . Medicare annual wellness visit, subsequent 02/01/2014  . Pedal edema 12/10/2013  . Overactive bladder 12/10/2013  . Abdominal aortic aneurysm ([place]) 10/01/2013  . Arthritis of both knees 10/01/2013  . Benign paroxysmal positional vertigo 10/01/2013  . Neck pain 10/01/2013  . Esophageal reflux 10/01/2013  . Thrombocytopenia ([place]) 03/17/2012  . 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   . Hyperlipidemia, mixed   . Breast cancer [facility])     Lauralee Evener, SPT 09/10/2016, 5:18 PM  [facility] [address]  [place], [place], [address] Phone: [phone]   Fax:  [phone]  Name: Ashley Savage MRN: [id number] Date of Birth: 1940/10/22

## 2016-09-12 ENCOUNTER — Encounter: Payer: Self-pay | Admitting: Family Medicine

## 2016-09-14 ENCOUNTER — Ambulatory Visit: Payer: PPO | Admitting: Physical Therapy

## 2016-09-14 ENCOUNTER — Other Ambulatory Visit: Payer: Self-pay | Admitting: Family Medicine

## 2016-09-14 DIAGNOSIS — R2681 Unsteadiness on feet: Secondary | ICD-10-CM

## 2016-09-14 DIAGNOSIS — M79604 Pain in right leg: Secondary | ICD-10-CM | POA: Diagnosis not present

## 2016-09-14 DIAGNOSIS — M6281 Muscle weakness (generalized): Secondary | ICD-10-CM

## 2016-09-14 DIAGNOSIS — R262 Difficulty in walking, not elsewhere classified: Secondary | ICD-10-CM

## 2016-09-14 MED ORDER — ONETOUCH ULTRA 2 W/DEVICE KIT: PACK | 0 refills | Status: DC

## 2016-09-14 MED FILL — tiZANidine HCL 4 MG TABS: 4 | 10 days supply | Qty: 30 | Fill #1

## 2016-09-14 NOTE — Therapy (Signed)
[facility] [address]  [place], [place], [address] Phone: [phone]   Fax:  [phone]  Physical Therapy Treatment  Patient Details  Name: Ashley Savage MRN: [id number] Date of Birth: 09-10-1940 Referring Provider: Dr. Karlton Lemon  Encounter Date: 09/14/2016      PT End of Session - 09/14/16 1405    Visit Number 6   Number of Visits 16   Date for PT Re-Evaluation 10/23/16   Authorization Type [employer] VL: Follow Medicare Guidelines   PT Start Time 1400   PT Stop Time 1500   PT Time Calculation (min) 60 min   Activity Tolerance Patient limited by pain;Patient tolerated treatment well   Behavior During Therapy [facility] for tasks assessed/performed      Past Medical History:  Diagnosis Date  . Anxiety   . Arthritis of both knees 10/01/2013  . Benign paroxysmal positional vertigo 10/01/2013  . Cancer [facility]) breast ca  right  . COPD (chronic obstructive pulmonary disease) ([place]) 10/01/2013  . Depression   . Diabetes mellitus type 2  . Emphysema   . Encephalopathy, hepatic ([place]) 06/07/2014  . Esophageal reflux 10/01/2013  . Fall 07/30/2016  . Hyperlipidemia   . Hyperlipidemia, mixed   . Increased ammonia level 11/25/2014  . NASH (nonalcoholic steatohepatitis) 08/26/2015  . Neck pain 10/01/2013  . Neuropathy (HCC)    feet   . Overactive bladder 12/10/2013  . Panic attacks   . Pedal edema 12/10/2013  . Preventative health care 03/08/2016  . Tobacco abuse disorder 02/01/2014    Past Surgical History:  Procedure Laterality Date  . APPENDECTOMY  2007  . BREAST SURGERY  2009 right  . CATARACT EXTRACTION     x 2  . ESOPHAGOGASTRODUODENOSCOPY (EGD) WITH PROPOFOL N/A 08/14/2016   Procedure: ESOPHAGOGASTRODUODENOSCOPY (EGD) WITH PROPOFOL;  Surgeon: Mauri Pole, MD;  Location: [facility];  Service: Endoscopy;  Laterality: N/A;  . [place]  . KNEE SURGERY    . MANDIBLE FRACTURE SURGERY    .  PILONIDAL CYST EXCISION    . TONSILLECTOMY      There were no vitals filed for this visit.      Subjective Assessment - 09/14/16 1403    Subjective Pt reports she has been having some trouble sleeping due to her back pain. She states she felt & heard a few pops in that area.    Patient Stated Goals "want to know what is wrong and get it fixed"   Currently in Pain? No/denies   Pain Score 0-No pain   Pain Location Groin   Pain Orientation Right;Anterior;Medial                         [facility] Adult PT Treatment/Exercise - 09/14/16 0001      Ambulation/Gait   Ambulation/Gait Assistance 5: Supervision   Ambulation/Gait Assistance Details Cues for upright posture & to keep her steps within the walker   Ambulation Distance (Feet) 180 Feet   Assistive device Rolling walker   Gait Pattern Step-through pattern;Decreased stance time - right;Trunk flexed   Ambulation Surface Level;Indoor   Gait Comments Knee valgus noticed with R knee in stance     Knee/Hip Exercises: Aerobic   Nustep lvl 4 x 7'     Moist Heat Therapy   Number Minutes Moist Heat 10 Minutes   Moist Heat Location Lumbar Spine     Manual Therapy   Manual  Therapy Muscle Energy Technique;Soft tissue mobilization;Joint mobilization   Joint Mobilization R posterior inominate rotation of the pelvis; AP sacral mobs   Soft tissue mobilization STM & IASTM with green ball to R piriformis area.    Muscle Energy Technique Performed MET to correct apparent R anterior inominate rotation of pelvis on sacrum, followed by pelvic shotgun.                   PT Short Term Goals - 09/14/16 1454      PT SHORT TERM GOAL #1   Title Pt will be independent with basic HEP by 09/25/16.   Status Achieved     PT SHORT TERM GOAL #2   Title Pt will be able to demonstrate safe gait pattern with rolling walker by 09/25/16.   Status On-going           PT Long Term Goals - 08/31/16 1451      PT LONG TERM GOAL #1    Title pt will be independent with advanced HEP by 10/23/16.   Status On-going     PT LONG TERM GOAL #2   Title Pt will have increased R hip & knee strength to >/= 4-/5 to improve function by 10/23/16.   Status On-going     PT LONG TERM GOAL #3   Title pt will be able to ambulate community distances with cane (or least restricitive device) safely and independently by 10/23/16.    Status On-going     PT LONG TERM GOAL #4   Title pt will report at least 50% reduction in R leg and back pain with weight-bearing activities by 10/23/16.   Status On-going     PT LONG TERM GOAL #5   Title Pt will improve gait speed to at least 1.8 ft/sec to reduce fall risk by 10/23/16.   Status On-going               Plan - 09/14/16 1454    Clinical Impression Statement Pt is reporting she has had some difficulty sleeping due to pain in her low back near her R SIJ. She reports hearing and feeling some popping in that area. Pt appeared to have pelvis anteriorly rotated on sacrum on the R side. Following MET, this appeared to be partially corrected. STM & IASTM was begun to R piriformis area where patient has been having some increased pain. AP sacral mobs and posterior innominate rotation on the R side was tolerated well today. Pt reported some increased pain following STM & IASTM so a moist heat pack was applied to lumbar/SIJ. She stated her pain had decreased following the heat pack. Pt will be receiving an injection in her R knee tomorrow morning to hopefully reduce some of her knee pain. She has been limited in what we could do during therapy due to this R knee pain. She will continue to benefit from skilled therapy to improve overall function by reducing pain & increasing LE strength.    Rehab Potential Good   Clinical Impairments Affecting Rehab Potential COPD, DM, high fall risk, H/O BBPV, H/O R knee pain, Neuropathy in feet, inability to be mobile for 6 weeks   PT Treatment/Interventions Patient/family  education;ADLs/Self Care Home Management;Therapeutic exercise;Therapeutic activities;Balance training;Gait training;Stair training;Functional mobility training;Manual techniques;Moist Heat;Cryotherapy;Electrical Stimulation;Iontophoresis 1m/ml Dexamethasone;Neuromuscular re-education;DME Instruction;Taping;Dry needling   PT Next Visit Plan Reassess SIJ alignment PRN; gentle strengthening of the core & lower extremities; gait training to increase tolerance for weight bearing; manual therapy & modalities PRN.  Consulted and Agree with Plan of Care Patient      Patient will benefit from skilled therapeutic intervention in order to improve the following deficits and impairments:  Pain, Increased muscle spasms, Decreased strength, Decreased mobility, Decreased balance, Decreased activity tolerance, Decreased endurance, Difficulty walking, Postural dysfunction, Cardiopulmonary status limiting activity, Abnormal gait, Decreased knowledge of use of DME, Decreased safety awareness  Visit Diagnosis: Pain in right leg  Muscle weakness (generalized)  Difficulty in walking, not elsewhere classified  Unsteadiness on feet     Problem List Patient Active Problem List   Diagnosis Date Noted  . 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 Portal hypertensive gastropathy   . Lower back injury, initial encounter 08/05/2016  . Fall 07/30/2016  . Preventative health care 03/08/2016  . Muscle spasm 02/25/2016  . Chronic respiratory failure ([place]) 08/29/2015  . NASH (nonalcoholic steatohepatitis) 08/26/2015  . Type 2 diabetes mellitus with hyperglycemia, with long-term current use of insulin ([place])   . Liver cirrhosis secondary to NASH ([place]) 07/31/2015  . Diarrhea 12/30/2014  . Increased ammonia level 11/25/2014  . Diabetes mellitus type 2, controlled ([place]) 10/16/2014  . Superficial bruising 10/04/2014  . Encephalopathy, hepatic ([place]) 06/07/2014  . Knee pain, right 06/07/2014  . Peripheral edema  03/30/2014  . Sun-damaged skin 02/01/2014  . Anxiety and depression 02/01/2014  . Tobacco abuse disorder 02/01/2014  . Medicare annual wellness visit, subsequent 02/01/2014  . Pedal edema 12/10/2013  . Overactive bladder 12/10/2013  . Abdominal aortic aneurysm ([place]) 10/01/2013  . Arthritis of both knees 10/01/2013  . Benign paroxysmal positional vertigo 10/01/2013  . Neck pain 10/01/2013  . Esophageal reflux 10/01/2013  . Thrombocytopenia ([place]) 03/17/2012  . 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   . Hyperlipidemia, mixed   . Breast cancer [facility])     Lauralee Evener, SPT 09/14/2016, 3:15 PM   [facility] [address]  [place], [place], [address] Phone: [phone]   Fax:  [phone]  Name: IRAIS MOTTRAM MRN: [id number] Date of Birth: [birth year]

## 2016-09-15 ENCOUNTER — Encounter: Payer: Self-pay | Admitting: Family Medicine

## 2016-09-15 ENCOUNTER — Ambulatory Visit (INDEPENDENT_AMBULATORY_CARE_PROVIDER_SITE_OTHER): Payer: PPO | Admitting: Family Medicine

## 2016-09-15 ENCOUNTER — Ambulatory Visit: Payer: PPO | Admitting: Pulmonary Disease

## 2016-09-15 VITALS — BP 99/59 | HR 88 | Ht 67.0 in | Wt 150.0 lb

## 2016-09-15 DIAGNOSIS — M25561 Pain in right knee: Secondary | ICD-10-CM | POA: Diagnosis not present

## 2016-09-15 DIAGNOSIS — G8929 Other chronic pain: Secondary | ICD-10-CM | POA: Diagnosis not present

## 2016-09-15 MED ORDER — METHYLPREDNISOLONE ACETATE 40 MG/ML IJ SUSP
40.0000 mg | Freq: Once | INTRAMUSCULAR | Status: AC
  Administered 2016-09-15: 40 mg via INTRA_ARTICULAR

## 2016-09-15 NOTE — Patient Instructions (Signed)
Your knee pain is due to arthritis. These are the different classes of medicine you can take for this: Tylenol 500mg  1-2 tabs three times a day for pain. Aleve 1-2 tabs twice a day with food - minimize use of this as can irritate stomach, kidneys, heart. Capsaicin, aspercreme, or biofreeze topically up to four times a day may also help with pain. Cortisone injections are an option - you were given this today. It's important that you continue to stay active. Straight leg raises, knee extensions 3 sets of 10 once a day (add ankle weight if these become too easy). Consider physical therapy to strengthen muscles around the joint that hurts to take pressure off of the joint itself. Shoe inserts with good arch support may be helpful. Walker or cane if needed. Heat or ice 15 minutes at a time 3-4 times a day as needed to help with pain. Water aerobics and cycling with low resistance are the best two types of exercise for arthritis. Follow up with me in 1 month.

## 2016-09-16 NOTE — Assessment & Plan Note (Signed)
2/2 DJD.  Reviewed tylenol, topical medications.  Intraarticular injection given today.  Reviewed home exercises as well.  Heat/ice.  Continue use of walker.  F/u in 1 month.  After informed written consent, patient was seated on exam table. Right knee was prepped with alcohol swab and utilizing anterolateral approach, patient's right knee was injected intraarticularly with 3:1 bupivicaine: depomedrol. Patient tolerated the procedure well without immediate complications.

## 2016-09-16 NOTE — Progress Notes (Signed)
PCP: Penni Homans, MD  Subjective:   HPI: Patient is a 76 y.o. female here for right knee pain.  Patient reports she is overall doing well with PT for her hip. However, started to get pain over past few weeks in anterior right knee. Worse on lateral side. Has history of meniscectomy here remotely. Pain is 8/10 and sharp. Worse with walking. Using a walker. Tried flexogenix without benefit. Last cortisone shot by outside physician was early last year. No skin changes, numbness.  Past Medical History:  Diagnosis Date  . Anxiety   . Arthritis of both knees 10/01/2013  . Benign paroxysmal positional vertigo 10/01/2013  . Cancer [facility]) breast ca  right  . COPD (chronic obstructive pulmonary disease) ([place]) 10/01/2013  . Depression   . Diabetes mellitus type 2  . Emphysema   . Encephalopathy, hepatic ([place]) 06/07/2014  . Esophageal reflux 10/01/2013  . Fall 07/30/2016  . Hyperlipidemia   . Hyperlipidemia, mixed   . Increased ammonia level 11/25/2014  . NASH (nonalcoholic steatohepatitis) 08/26/2015  . Neck pain 10/01/2013  . Neuropathy (HCC)    feet   . Overactive bladder 12/10/2013  . Panic attacks   . Pedal edema 12/10/2013  . Preventative health care 03/08/2016  . Tobacco abuse disorder 02/01/2014    Current Outpatient Prescriptions on File Prior to Visit  Medication Sig Dispense Refill  . albuterol (ACCUNEB) 1.25 MG/3ML nebulizer solution Take 1 ampule by nebulization every 6 (six) hours as needed for wheezing.     Marland Kitchen albuterol (PROVENTIL HFA;VENTOLIN HFA) 108 (90 Base) MCG/ACT inhaler Inhale 2 puffs into the lungs every 6 (six) hours as needed for wheezing or shortness of breath. Only dispense Ventolin 1 Inhaler 3  . BD PEN NEEDLE NANO U/F 32G X 4 MM MISC USE AS DIRECTED WITH LEVEMIR FLEXPEN 100 each 0  . Blood Glucose Monitoring Suppl (ONE TOUCH ULTRA 2) w/Device KIT Use as directed once daily to check blood sugar.  DX E11.9 1 each 0  . cetirizine (ZYRTEC) 10 MG tablet Take 10 mg by mouth  daily as needed for allergies.     . Cholecalciferol (VITAMIN D3) 2000 units TABS Take 2,000 Units by mouth every morning.    . furosemide (LASIX) 20 MG tablet TAKE 1 TABLET BY MOUTH TWICE DAILY 60 tablet 0  . insulin aspart (NOVOLOG) 100 UNIT/ML injection 4 units SQ q lunch daily and prn Sliding scale:  BS <200 no units BS 201-250 use 2 units BS 251-300 use 4 units BS 301-350 use 6 units BS 351-400 use 8 units BS 401-450 use 10 units BS>451 call MD 10 mL 3  . Insulin Syringe-Needle U-100 31G X 5/16" 1 ML MISC To use w/ Novolog 100 each 12  . lactulose (CHRONULAC) 10 GM/15ML solution TAKE 45 MLS BY MOUTH TWICE DAILY AS NEEDED FOR MILD CONSTIPATION. (Patient taking differently: Take 90ms in the morning and take 60 mls in the afternoon) 1892 mL 6  . LEVEMIR FLEXTOUCH 100 UNIT/ML Pen INJECT 14 UNITS UNDER THE SKIN EVERY DAY AT 10PM 15 mL 0  . LIDOCAINE EX Apply 1 application topically at bedtime as needed (pain).    .Marland Kitchenomeprazole (PRILOSEC) 20 MG capsule TAKE 1 CAPSULE(20 MG) BY MOUTH DAILY 90 capsule 0  . ONE TOUCH ULTRA TEST test strip USE TWICE DAILY TO CHECK BLOOD SUGAR AS DIRECTED 100 each 4  . Polyvinyl Alcohol (LUBRICANT DROPS OP) Apply 1 drop to eye daily as needed (dry eyes).    . rifaximin (Doreene Nest  550 MG TABS tablet Take 1 tablet (550 mg total) by mouth 2 (two) times daily. 60 tablet 11  . sodium chloride (OCEAN) 0.65 % SOLN nasal spray Place 1 spray into both nostrils as needed for congestion. 30 mL 0  . SPIRIVA RESPIMAT 2.5 MCG/ACT AERS Inhale 2.5 mcg into the lungs daily as needed. (Patient taking differently: Inhale 2 puffs into the lungs daily. ) 3 Inhaler 3  . spironolactone (ALDACTONE) 50 MG tablet TAKE 2 TABLETS BY MOUTH DAILY FOR A TOTAL OF 100 MG 60 tablet 0  . tiZANidine (ZANAFLEX) 4 MG tablet Take 1 tablet (4 mg total) by mouth every 8 (eight) hours as needed for muscle spasms. (Patient taking differently: Take 2 mg by mouth every 8 (eight) hours as needed for muscle  spasms. ) 30 tablet 1  . traMADol (ULTRAM-ER) 100 MG 24 hr tablet Take 1 tablet (100 mg total) by mouth daily as needed for pain. 30 tablet 1   No current facility-administered medications on file prior to visit.     Past Surgical History:  Procedure Laterality Date  . APPENDECTOMY  2007  . BREAST SURGERY  2009 right  . CATARACT EXTRACTION     x 2  . ESOPHAGOGASTRODUODENOSCOPY (EGD) WITH PROPOFOL N/A 08/14/2016   Procedure: ESOPHAGOGASTRODUODENOSCOPY (EGD) WITH PROPOFOL;  Surgeon: Mauri Pole, MD;  Location: [facility];  Service: Endoscopy;  Laterality: N/A;  . [place]  . KNEE SURGERY    . MANDIBLE FRACTURE SURGERY    . PILONIDAL CYST EXCISION    . TONSILLECTOMY      Allergies  Allergen Reactions  . Citalopram     Irregular heart beat  . Erythromycin     Stomach cramps  . Glimepiride     Elevated ammonia levels  . Prednisone     Increased blood sugars too high  . Versed [Midazolam] Other (See Comments)    Patient stayed confusion stayed 4+days     Social History   Social History  . Marital status: Single    Spouse name: N/A  . Number of children: 0  . Years of education: N/A   Occupational History  . retired Retired   Social History Main Topics  . Smoking status: Current Some Day Smoker    Packs/day: 0.50    Years: 58.00    Types: Cigarettes    Start date: 07/27/1964  . Smokeless tobacco: Never Used     Comment: 1 pack per week, tobacco infor given 12/30/15  . Alcohol use No  . Drug use: No  . Sexual activity: No   Other Topics Concern  . Not on file   Social History Narrative   Lives alone, continues to smoke, no dietary restrictions    Family History  Problem Relation Age of Onset  . Heart failure Father   . COPD Father   . Arthritis Father 60  . Stroke Mother   . Arthritis Mother 20  . Hyperlipidemia Mother   . Hypertension Mother   . Diabetes Mother   . Diabetes Sister   . Breast cancer    . Breast cancer Maternal  Aunt   . Asthma Maternal Aunt   . Birth defects Maternal Aunt   . Alcohol abuse Maternal Uncle     BP (!) 99/59   Pulse 88   Ht _0  (1.702 m)   Wt 150 lb (68 kg)   BMI 23.49 kg/m   Review of Systems: See HPI above.  Objective:  Physical Exam:  Gen: NAD, comfortable in exam room  Right knee: No gross deformity, ecchymoses, effusion. TTP lateral joint line.  No other tenderness. FROM. Negative ant/post drawers. Negative valgus/varus testing. Negative lachmanns. Negative mcmurrays, apleys, patellar apprehension. NV intact distally.  Left knee; FROM without pain.  Assessment & Plan:  1. 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  Reviewed tylenol, topical medications.  Intraarticular injection given today.  Reviewed home exercises as well.  Heat/ice.  Continue use of walker.  F/u in 1 month.  After informed written consent, patient was seated on exam table. Right knee was prepped with alcohol swab and utilizing anterolateral approach, patient's right knee was injected intraarticularly with 3:1 bupivicaine: depomedrol. Patient tolerated the procedure well without immediate complications.

## 2016-09-17 ENCOUNTER — Ambulatory Visit: Payer: PPO | Admitting: Physical Therapy

## 2016-09-17 DIAGNOSIS — M79604 Pain in right leg: Secondary | ICD-10-CM | POA: Diagnosis not present

## 2016-09-17 DIAGNOSIS — R262 Difficulty in walking, not elsewhere classified: Secondary | ICD-10-CM

## 2016-09-17 DIAGNOSIS — R2681 Unsteadiness on feet: Secondary | ICD-10-CM

## 2016-09-17 DIAGNOSIS — M6281 Muscle weakness (generalized): Secondary | ICD-10-CM

## 2016-09-17 NOTE — Therapy (Signed)
[facility] [address]  [place], [place], [address] Phone: [phone]   Fax:  [phone]  Physical Therapy Treatment  Patient Details  Name: Ashley Savage MRN: [id number] Date of Birth: 1941/04/12 Referring Provider: Dr. Karlton Lemon  Encounter Date: 09/17/2016      PT End of Session - 09/17/16 1358    Visit Number 7   Number of Visits 16   Date for PT Re-Evaluation 10/23/16   Authorization Type [employer] VL: Follow Medicare Guidelines   PT Start Time 1358   PT Stop Time 1502   PT Time Calculation (min) 64 min   Activity Tolerance Patient limited by pain   Behavior During Therapy [facility] for tasks assessed/performed      Past Medical History:  Diagnosis Date  . Anxiety   . Arthritis of both knees 10/01/2013  . Benign paroxysmal positional vertigo 10/01/2013  . Cancer [facility]) breast ca  right  . COPD (chronic obstructive pulmonary disease) ([place]) 10/01/2013  . Depression   . Diabetes mellitus type 2  . Emphysema   . Encephalopathy, hepatic ([place]) 06/07/2014  . Esophageal reflux 10/01/2013  . Fall 07/30/2016  . Hyperlipidemia   . Hyperlipidemia, mixed   . Increased ammonia level 11/25/2014  . NASH (nonalcoholic steatohepatitis) 08/26/2015  . Neck pain 10/01/2013  . Neuropathy (HCC)    feet   . Overactive bladder 12/10/2013  . Panic attacks   . Pedal edema 12/10/2013  . Preventative health care 03/08/2016  . Tobacco abuse disorder 02/01/2014    Past Surgical History:  Procedure Laterality Date  . APPENDECTOMY  2007  . BREAST SURGERY  2009 right  . CATARACT EXTRACTION     x 2  . ESOPHAGOGASTRODUODENOSCOPY (EGD) WITH PROPOFOL N/A 08/14/2016   Procedure: ESOPHAGOGASTRODUODENOSCOPY (EGD) WITH PROPOFOL;  Surgeon: Mauri Pole, MD;  Location: [facility];  Service: Endoscopy;  Laterality: N/A;  . [place]  . KNEE SURGERY    . MANDIBLE FRACTURE SURGERY    . PILONIDAL CYST EXCISION    .  TONSILLECTOMY      There were no vitals filed for this visit.      Subjective Assessment - 09/17/16 1400    Subjective Pt reports pain relieved after last visit and had a few good days until she woke up this morning with pain significantly increased. Was even able to walk some w/o RW yesterday. Received a 3:1 bupivicaine: depomedrol injection in the R knee on Tuesday with some relief noted.   Patient Stated Goals "want to know what is wrong and get it fixed"   Currently in Pain? Yes   Pain Score 8    Pain Location Groin   Pain Orientation Right   Pain Descriptors / Indicators Tightness   Pain Type Acute pain   Pain Radiating Towards to R SIJ and down anterior/medial thigh to just above knee   Pain Onset More than a month ago   Pain Frequency Intermittent   Aggravating Factors  unknown   Pain Relieving Factors nothing                         [facility] Adult PT Treatment/Exercise - 09/17/16 1358      Knee/Hip Exercises: Stretches   Piriformis Stretch Right;30 seconds;2 reps   Piriformis Stretch Limitations figure 4 & KTOS (manual by PT)   Other Knee/Hip Stretches Prolonged gradual progression of static R hip adductor stretch in  hooklying with deep pressure to inhibit muscle spasms x10', followed by B hookyling adductor stretch 30"x2     Knee/Hip Exercises: Aerobic   Nustep lvl 4 x 6'     Modalities   Modalities Electrical Stimulation;Moist Heat     Moist Heat Therapy   Number Minutes Moist Heat 15 Minutes   Moist Heat Location Lumbar Spine;Hip  low back/scarum & hip adductors     Electrical Stimulation   Electrical Stimulation Location R low back/butock & R hip adductors, pt in hooklying   Electrical Stimulation Action Pre-mod   Electrical Stimulation Parameters intensity to pt tol   Electrical Stimulation Goals Pain     Manual Therapy   Manual Therapy Soft tissue mobilization;Myofascial release   Manual therapy comments pt in hooklying, attempted L  sidelying but unable to tolerate   Soft tissue mobilization strumming to R hip adductors   Myofascial Release MFR & TPR to R hip adductors with gradual progression of hip adductor stretch                  PT Short Term Goals - 09/14/16 1454      PT SHORT TERM GOAL #1   Title Pt will be independent with basic HEP by 09/25/16.   Status Achieved     PT SHORT TERM GOAL #2   Title Pt will be able to demonstrate safe gait pattern with rolling walker by 09/25/16.   Status On-going           PT Long Term Goals - 08/31/16 1451      PT LONG TERM GOAL #1   Title pt will be independent with advanced HEP by 10/23/16.   Status On-going     PT LONG TERM GOAL #2   Title Pt will have increased R hip & knee strength to >/= 4-/5 to improve function by 10/23/16.   Status On-going     PT LONG TERM GOAL #3   Title pt will be able to ambulate community distances with cane (or least restricitive device) safely and independently by 10/23/16.    Status On-going     PT LONG TERM GOAL #4   Title pt will report at least 50% reduction in R leg and back pain with weight-bearing activities by 10/23/16.   Status On-going     PT LONG TERM GOAL #5   Title Pt will improve gait speed to at least 1.8 ft/sec to reduce fall risk by 10/23/16.   Status On-going               Plan - 09/17/16 1407    Clinical Impression Statement Pt reporting good relief of pain, back to 75% of normal following last visit and lasting until yesterday, but pt woke up with severe R groin and SIJ/buttock pain and muscle spasms again this morning without identifiable trigger. SIJ alignment reassessed with alignment appearing WNL. Attempted manual stretch with STM/MFR to R hip adductors with gross spasm of the muscle observed requiring deep pressure to inhibit spasm with prolonged slow progression static stretch. When pt attempted to roll to L side to address R posterior hip musculature, severe spasm returned to R hip adductors  with pt unable to tolerate position. Treatment concluded with pre-mod estim and moist heat to R low back/buttock and R hip adductors. Pt reporting improvement in pain upon completion of visit. R knee injection seems to have decreased knee pain interference with therapy tolerance but difficult to determine full impact as pt with poor  tolerance to therapy today from hip adductor and low back/buttock pain.   Rehab Potential Good   Clinical Impairments Affecting Rehab Potential COPD, DM, high fall risk, H/O BBPV, H/O R knee pain, Neuropathy in feet, inability to be mobile for 6 weeks   PT Treatment/Interventions Patient/family education;ADLs/Self Care Home Management;Therapeutic exercise;Therapeutic activities;Balance training;Gait training;Stair training;Functional mobility training;Manual techniques;Moist Heat;Cryotherapy;Electrical Stimulation;Iontophoresis 4mg /ml Dexamethasone;Neuromuscular re-education;DME Instruction;Taping;Dry needling   PT Next Visit Plan Assess response to estim; Reassess SIJ alignment PRN; gentle strengthening of the core & lower extremities; gait training to increase tolerance for weight bearing; manual therapy & modalities PRN.   Consulted and Agree with Plan of Care Patient      Patient will benefit from skilled therapeutic intervention in order to improve the following deficits and impairments:  Pain, Increased muscle spasms, Decreased strength, Decreased mobility, Decreased balance, Decreased activity tolerance, Decreased endurance, Difficulty walking, Postural dysfunction, Cardiopulmonary status limiting activity, Abnormal gait, Decreased knowledge of use of DME, Decreased safety awareness  Visit Diagnosis: Pain in right leg  Muscle weakness (generalized)  Difficulty in walking, not elsewhere classified  Unsteadiness on feet     Problem List Patient Active Problem List   Diagnosis Date Noted  . 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 Portal hypertensive  gastropathy   . Lower back injury, initial encounter 08/05/2016  . Fall 07/30/2016  . Preventative health care 03/08/2016  . Muscle spasm 02/25/2016  . Chronic respiratory failure ([place]) 08/29/2015  . NASH (nonalcoholic steatohepatitis) 08/26/2015  . Type 2 diabetes mellitus with hyperglycemia, with long-term current use of insulin ([place])   . Liver cirrhosis secondary to NASH ([place]) 07/31/2015  . Diarrhea 12/30/2014  . Increased ammonia level 11/25/2014  . Diabetes mellitus type 2, controlled ([place]) 10/16/2014  . Superficial bruising 10/04/2014  . Encephalopathy, hepatic ([place]) 06/07/2014  . Right knee pain 06/07/2014  . Peripheral edema 03/30/2014  . Sun-damaged skin 02/01/2014  . Anxiety and depression 02/01/2014  . Tobacco abuse disorder 02/01/2014  . Medicare annual wellness visit, subsequent 02/01/2014  . Pedal edema 12/10/2013  . Overactive bladder 12/10/2013  . Abdominal aortic aneurysm ([place]) 10/01/2013  . Arthritis of both knees 10/01/2013  . Benign paroxysmal positional vertigo 10/01/2013  . Neck pain 10/01/2013  . Esophageal reflux 10/01/2013  . Thrombocytopenia ([place]) 03/17/2012  . 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   . Hyperlipidemia, mixed   . Breast cancer [facility])     Percival Spanish 09/17/2016, 3:27 PM  [facility] [address]  [place], [place], [address] Phone: [phone]   Fax:  [phone]  Name: FRANCILE PEKALA MRN: [id number] Date of Birth: 07-05-1941

## 2016-09-21 ENCOUNTER — Ambulatory Visit: Payer: PPO | Admitting: Physical Therapy

## 2016-09-21 DIAGNOSIS — M79604 Pain in right leg: Secondary | ICD-10-CM

## 2016-09-21 DIAGNOSIS — R262 Difficulty in walking, not elsewhere classified: Secondary | ICD-10-CM

## 2016-09-21 DIAGNOSIS — R2681 Unsteadiness on feet: Secondary | ICD-10-CM

## 2016-09-21 DIAGNOSIS — M6281 Muscle weakness (generalized): Secondary | ICD-10-CM

## 2016-09-21 NOTE — Therapy (Signed)
[facility] [address]  [place], [place], [address] Phone: [phone]   Fax:  [phone]  Physical Therapy Treatment  Patient Details  Name: Ashley Savage MRN: [id number] Date of Birth: 01/08/1941 Referring Provider: Dr. Karlton Lemon  Encounter Date: 09/21/2016      PT End of Session - 09/21/16 1409    Visit Number 8   Number of Visits 16   Date for PT Re-Evaluation 10/23/16   Authorization Type [employer] VL: Follow Medicare Guidelines   PT Start Time 1402   PT Stop Time 1459   PT Time Calculation (min) 57 min   Activity Tolerance Patient tolerated treatment well;Patient limited by pain   Behavior During Therapy [facility] for tasks assessed/performed      Past Medical History:  Diagnosis Date  . Anxiety   . Arthritis of both knees 10/01/2013  . Benign paroxysmal positional vertigo 10/01/2013  . Cancer [facility]) breast ca  right  . COPD (chronic obstructive pulmonary disease) ([place]) 10/01/2013  . Depression   . Diabetes mellitus type 2  . Emphysema   . Encephalopathy, hepatic ([place]) 06/07/2014  . Esophageal reflux 10/01/2013  . Fall 07/30/2016  . Hyperlipidemia   . Hyperlipidemia, mixed   . Increased ammonia level 11/25/2014  . NASH (nonalcoholic steatohepatitis) 08/26/2015  . Neck pain 10/01/2013  . Neuropathy (HCC)    feet   . Overactive bladder 12/10/2013  . Panic attacks   . Pedal edema 12/10/2013  . Preventative health care 03/08/2016  . Tobacco abuse disorder 02/01/2014    Past Surgical History:  Procedure Laterality Date  . APPENDECTOMY  2007  . BREAST SURGERY  2009 right  . CATARACT EXTRACTION     x 2  . ESOPHAGOGASTRODUODENOSCOPY (EGD) WITH PROPOFOL N/A 08/14/2016   Procedure: ESOPHAGOGASTRODUODENOSCOPY (EGD) WITH PROPOFOL;  Surgeon: Mauri Pole, MD;  [facility];  Service: Endoscopy;  Laterality: N/A;  . [place]  . KNEE SURGERY    . MANDIBLE FRACTURE SURGERY    .  PILONIDAL CYST EXCISION    . TONSILLECTOMY      There were no vitals filed for this visit.      Subjective Assessment - 09/21/16 1405    Subjective Pt reports pain still worst upon initiation of movement, particularly with sit to stand and first few steps, but eases off once moving.   Patient Stated Goals "want to know what is wrong and get it fixed"   Currently in Pain? Yes   Pain Score 5   up to 8/10 with initiation of movement   Pain Location Groin   Pain Orientation Right   Pain Descriptors / Indicators Tightness  "pulling"   Pain Type Acute pain   Pain Frequency Intermittent   Aggravating Factors  initiation of movement, esp sit to stand   Pain Relieving Factors sitting down, resting   Effect of Pain on Daily Activities difficulty with transitions                         [facility] Adult PT Treatment/Exercise - 09/21/16 1402      Ambulation/Gait   Ambulation/Gait Assistance 5: Supervision   Ambulation/Gait Assistance Details Cues for upright posture & to keep her steps within the walker   Ambulation Distance (Feet) 200 Feet   Assistive device Rolling walker   Gait Pattern Step-through pattern;Decreased stance time - right;Trunk flexed   Ambulation Surface Level;Indoor  Gait Comments Improved posture and gait pattern after RW adjusted 1 notch taller     Knee/Hip Exercises: Aerobic   Nustep lvl 5 x 6'     Knee/Hip Exercises: Supine   Bridges with Clamshell Both;10 reps  hip ABD isometric with belt   Knee Flexion Both;10 reps   Knee Flexion Limitations HS curls with heels on peanut ball   Other Supine Knee/Hip Exercises Hooklying alt hip ABD/ER with green TB 15 x3"   Other Supine Knee/Hip Exercises Straight leg bridge with heels on peanut ball 10x3"      Knee/Hip Exercises: Sidelying   Clams R clam in L sidelying (no resistance) 10x3"     Moist Heat Therapy   Number Minutes Moist Heat 10 Minutes   Moist Heat Location Lumbar Spine;Hip  low  back/scarum & hip adductors     Manual Therapy   Manual Therapy Soft tissue mobilization;Myofascial release;Muscle Energy Technique   Manual therapy comments pt in L sidelying with pillow btw knees   Soft tissue mobilization STM & IASTM with 4" ball to R piriformis   Myofascial Release R piriformis   Muscle Energy Technique Performed MET to correct apparent R anterior inominate rotation of pelvis on sacrum, followed by pelvic shotgun.                   PT Short Term Goals - 09/21/16 1423      PT SHORT TERM GOAL #1   Title Pt will be independent with basic HEP by 09/25/16.   Status Achieved     PT SHORT TERM GOAL #2   Title Pt will be able to demonstrate safe gait pattern with rolling walker by 09/25/16.   Status Achieved           PT Long Term Goals - 08/31/16 1451      PT LONG TERM GOAL #1   Title pt will be independent with advanced HEP by 10/23/16.   Status On-going     PT LONG TERM GOAL #2   Title Pt will have increased R hip & knee strength to >/= 4-/5 to improve function by 10/23/16.   Status On-going     PT LONG TERM GOAL #3   Title pt will be able to ambulate community distances with cane (or least restricitive device) safely and independently by 10/23/16.    Status On-going     PT LONG TERM GOAL #4   Title pt will report at least 50% reduction in R leg and back pain with weight-bearing activities by 10/23/16.   Status On-going     PT LONG TERM GOAL #5   Title Pt will improve gait speed to at least 1.8 ft/sec to reduce fall risk by 10/23/16.   Status On-going               Plan - 09/21/16 1423    Clinical Impression Statement Pt continues to report most intense pain in R buttock and groin upon initiation of movement with pain easing somewhat once she walks a few steps. Reviewed gait safety with RW with cues for proper hand placement during transfers and placement of step between rear leags of walker. Also adjusted RW 1 notch taller to promote  improved upright posture, with pt able to demonstrated improved gait pattern after adjustment. Pt able to tolerate more strengthening exercises today, but remains severely weak with hip ABD/ER (clam) against gravity in sidelying. Pt not noting much benefit from e-stim, therefore visit completed with just moist  heat. to promote further muscle relaxation after manual therapy to piriformis.   Rehab Potential Good   Clinical Impairments Affecting Rehab Potential COPD, DM, high fall risk, H/O BBPV, H/O R knee pain, Neuropathy in feet, inability to be mobile for 6 weeks   PT Treatment/Interventions Patient/family education;ADLs/Self Care Home Management;Therapeutic exercise;Therapeutic activities;Balance training;Gait training;Stair training;Functional mobility training;Manual techniques;Moist Heat;Cryotherapy;Electrical Stimulation;Iontophoresis 77m/ml Dexamethasone;Neuromuscular re-education;DME Instruction;Taping;Dry needling   PT Next Visit Plan Reassess SIJ alignment PRN; gentle strengthening of the core & lower extremities; gait training to increase tolerance for weight bearing; manual therapy & modalities PRN.   Consulted and Agree with Plan of Care Patient      Patient will benefit from skilled therapeutic intervention in order to improve the following deficits and impairments:  Pain, Increased muscle spasms, Decreased strength, Decreased mobility, Decreased balance, Decreased activity tolerance, Decreased endurance, Difficulty walking, Postural dysfunction, Cardiopulmonary status limiting activity, Abnormal gait, Decreased knowledge of use of DME, Decreased safety awareness  Visit Diagnosis: Pain in right leg  Muscle weakness (generalized)  Difficulty in walking, not elsewhere classified  Unsteadiness on feet     Problem List Patient Active Problem List   Diagnosis Date Noted  . 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 Portal hypertensive gastropathy   . Lower back injury, initial  encounter 08/05/2016  . Fall 07/30/2016  . Preventative health care 03/08/2016  . Muscle spasm 02/25/2016  . Chronic respiratory failure ([place] 08/29/2015  . NASH (nonalcoholic steatohepatitis) 08/26/2015  . Type 2 diabetes mellitus with hyperglycemia, with long-term current use of insulin ([place]   . Liver cirrhosis secondary to NASH ([place] 07/31/2015  . Diarrhea 12/30/2014  . Increased ammonia level 11/25/2014  . Diabetes mellitus type 2, controlled ([place] 10/16/2014  . Superficial bruising 10/04/2014  . Encephalopathy, hepatic ([place] 06/07/2014  . Right knee pain 06/07/2014  . Peripheral edema 03/30/2014  . Sun-damaged skin 02/01/2014  . Anxiety and depression 02/01/2014  . Tobacco abuse disorder 02/01/2014  . Medicare annual wellness visit, subsequent 02/01/2014  . Pedal edema 12/10/2013  . Overactive bladder 12/10/2013  . Abdominal aortic aneurysm ([place] 10/01/2013  . Arthritis of both knees 10/01/2013  . Benign paroxysmal positional vertigo 10/01/2013  . Neck pain 10/01/2013  . Esophageal reflux 10/01/2013  . Thrombocytopenia ([place] 03/17/2012  . 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   . Hyperlipidemia, mixed   . Breast cancer ([facility]     JPercival Spanish PT, MPT 09/21/2016, 3:21 PM  [facility] [place] [address]Phone: [phone]  Fax:  [phone] Name: Ashley WAITERSMRN: [id number] of Birth: 9Mar 25, 1942

## 2016-09-24 ENCOUNTER — Other Ambulatory Visit: Payer: PPO

## 2016-09-24 ENCOUNTER — Ambulatory Visit: Payer: PPO | Attending: Family Medicine | Admitting: Physical Therapy

## 2016-09-24 ENCOUNTER — Other Ambulatory Visit: Payer: Self-pay | Admitting: Gastroenterology

## 2016-09-24 ENCOUNTER — Ambulatory Visit: Payer: PPO | Admitting: Family

## 2016-09-24 DIAGNOSIS — M6281 Muscle weakness (generalized): Secondary | ICD-10-CM | POA: Diagnosis not present

## 2016-09-24 DIAGNOSIS — R262 Difficulty in walking, not elsewhere classified: Secondary | ICD-10-CM | POA: Insufficient documentation

## 2016-09-24 DIAGNOSIS — M79604 Pain in right leg: Secondary | ICD-10-CM | POA: Diagnosis not present

## 2016-09-24 DIAGNOSIS — R2681 Unsteadiness on feet: Secondary | ICD-10-CM

## 2016-09-24 NOTE — Therapy (Signed)
[facility] [address]  [place], [place], [address] Phone: [phone]   Fax:  [phone]  Physical Therapy Treatment  Patient Details  Name: Ashley Savage MRN: [id number] Date of Birth: [birth year] Referring Provider: Dr. Karlton Lemon  Encounter Date: 09/24/2016      PT End of Session - 09/24/16 1405    Visit Number 9   Number of Visits 16   Date for PT Re-Evaluation 10/23/16   Authorization Type [employer] VL: Follow Medicare Guidelines   PT Start Time 1405   PT Stop Time 1450   PT Time Calculation (min) 45 min   Activity Tolerance Patient tolerated treatment well   Behavior During Therapy [facility] for tasks assessed/performed      Past Medical History:  Diagnosis Date  . Anxiety   . Arthritis of both knees 10/01/2013  . Benign paroxysmal positional vertigo 10/01/2013  . Cancer [facility]) breast ca  right  . COPD (chronic obstructive pulmonary disease) ([place]) 10/01/2013  . Depression   . Diabetes mellitus type 2  . Emphysema   . Encephalopathy, hepatic ([place]) 06/07/2014  . Esophageal reflux 10/01/2013  . Fall 07/30/2016  . Hyperlipidemia   . Hyperlipidemia, mixed   . Increased ammonia level 11/25/2014  . NASH (nonalcoholic steatohepatitis) 08/26/2015  . Neck pain 10/01/2013  . Neuropathy (HCC)    feet   . Overactive bladder 12/10/2013  . Panic attacks   . Pedal edema 12/10/2013  . Preventative health care 03/08/2016  . Tobacco abuse disorder 02/01/2014    Past Surgical History:  Procedure Laterality Date  . APPENDECTOMY  2007  . BREAST SURGERY  2009 right  . CATARACT EXTRACTION     x 2  . ESOPHAGOGASTRODUODENOSCOPY (EGD) WITH PROPOFOL N/A 08/14/2016   Procedure: ESOPHAGOGASTRODUODENOSCOPY (EGD) WITH PROPOFOL;  Surgeon: Mauri Pole, MD;  Location: [facility];  Service: Endoscopy;  Laterality: N/A;  . [place]  . KNEE SURGERY    . MANDIBLE FRACTURE SURGERY    . PILONIDAL CYST EXCISION    .  TONSILLECTOMY      There were no vitals filed for this visit.      Subjective Assessment - 09/24/16 1408    Subjective Pt reporting pain is better today, but noting more discomfort in anterior thigh when she tries to lift the leg - thinks this is just weakness.   Patient Stated Goals "want to know what is wrong and get it fixed"   Currently in Pain? Yes   Pain Score 3    Pain Location Groin   Pain Orientation Right   Pain Descriptors / Indicators Tightness                         [facility] Adult PT Treatment/Exercise - 09/24/16 1405      Knee/Hip Exercises: Aerobic   Nustep lvl 5 x 6'     Knee/Hip Exercises: Standing   Heel Raises Both;15 reps;3 seconds   Heel Raises Limitations UE support on TM rail   Hip Flexion Both;15 reps;Knee bent   Hip Flexion Limitations marching with UE support on TM rail   Side Lunges Left;5 reps  10 sec   Side Lunges Limitations R adductor stretch   Hip Abduction Both;10 reps;Knee straight   Abduction Limitations UE support on TM rail   Hip Extension Both;10 reps;Knee straight   Extension Limitations UE support on TM rail     Knee/Hip  Exercises: Seated   Long Arc Quad Both;10 reps   Long Arc Quad Limitations LAQ with ball squeeze; increased pain upon attmept D/C   Ball Squeeze 10 x 5"    Clamshell with TheraBand Red  Unilateral clam shell 15 x 3" holds   Marching Both;15 reps   Marching Limitations Red TB   Hamstring Curl Right;10 reps   Hamstring Limitations Red TB                  PT Short Term Goals - 09/21/16 1423      PT SHORT TERM GOAL #1   Title Pt will be independent with basic HEP by 09/25/16.   Status Achieved     PT SHORT TERM GOAL #2   Title Pt will be able to demonstrate safe gait pattern with rolling walker by 09/25/16.   Status Achieved           PT Long Term Goals - 08/31/16 1451      PT LONG TERM GOAL #1   Title pt will be independent with advanced HEP by 10/23/16.   Status On-going      PT LONG TERM GOAL #2   Title Pt will have increased R hip & knee strength to >/= 4-/5 to improve function by 10/23/16.   Status On-going     PT LONG TERM GOAL #3   Title pt will be able to ambulate community distances with cane (or least restricitive device) safely and independently by 10/23/16.    Status On-going     PT LONG TERM GOAL #4   Title pt will report at least 50% reduction in R leg and back pain with weight-bearing activities by 10/23/16.   Status On-going     PT LONG TERM GOAL #5   Title Pt will improve gait speed to at least 1.8 ft/sec to reduce fall risk by 10/23/16.   Status On-going               Plan - 09/24/16 1410    Clinical Impression Statement Pt reporting decreased pain today and pt able to resume strengthening exercises w/o increased pain but does require frequent rest breaks due to fatigue.   Rehab Potential Good   Clinical Impairments Affecting Rehab Potential COPD, DM, high fall risk, H/O BBPV, H/O R knee pain, Neuropathy in feet, inability to be mobile for 6 weeks   PT Treatment/Interventions Patient/family education;ADLs/Self Care Home Management;Therapeutic exercise;Therapeutic activities;Balance training;Gait training;Stair training;Functional mobility training;Manual techniques;Moist Heat;Cryotherapy;Electrical Stimulation;Iontophoresis 4mg /ml Dexamethasone;Neuromuscular re-education;DME Instruction;Taping;Dry needling   PT Next Visit Plan 10 visit FOTO & G-code; Reassess SIJ alignment PRN; gentle strengthening of the core & lower extremities; gait training to increase tolerance for weight bearing; manual therapy & modalities PRN.   Consulted and Agree with Plan of Care Patient      Patient will benefit from skilled therapeutic intervention in order to improve the following deficits and impairments:  Pain, Increased muscle spasms, Decreased strength, Decreased mobility, Decreased balance, Decreased activity tolerance, Decreased endurance, Difficulty  walking, Postural dysfunction, Cardiopulmonary status limiting activity, Abnormal gait, Decreased knowledge of use of DME, Decreased safety awareness  Visit Diagnosis: Pain in right leg  Muscle weakness (generalized)  Difficulty in walking, not elsewhere classified  Unsteadiness on feet     Problem List Patient Active Problem List   Diagnosis Date Noted  . 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 Portal hypertensive gastropathy   . Lower back injury, initial encounter 08/05/2016  . Fall 07/30/2016  .  Preventative health care 03/08/2016  . Muscle spasm 02/25/2016  . Chronic respiratory failure ([place]) 08/29/2015  . NASH (nonalcoholic steatohepatitis) 08/26/2015  . Type 2 diabetes mellitus with hyperglycemia, with long-term current use of insulin ([place])   . Liver cirrhosis secondary to NASH ([place]) 07/31/2015  . Diarrhea 12/30/2014  . Increased ammonia level 11/25/2014  . Diabetes mellitus type 2, controlled ([place]) 10/16/2014  . Superficial bruising 10/04/2014  . Encephalopathy, hepatic ([place]) 06/07/2014  . Right knee pain 06/07/2014  . Peripheral edema 03/30/2014  . Sun-damaged skin 02/01/2014  . Anxiety and depression 02/01/2014  . Tobacco abuse disorder 02/01/2014  . Medicare annual wellness visit, subsequent 02/01/2014  . Pedal edema 12/10/2013  . Overactive bladder 12/10/2013  . Abdominal aortic aneurysm ([place]) 10/01/2013  . Arthritis of both knees 10/01/2013  . Benign paroxysmal positional vertigo 10/01/2013  . Neck pain 10/01/2013  . Esophageal reflux 10/01/2013  . Thrombocytopenia ([place]) 03/17/2012  . 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   . Hyperlipidemia, mixed   . Breast cancer [facility])     Percival Spanish, PT, MPT 09/24/2016, 3:01 PM  [facility] [address]  [place], [place], [address] Phone: [phone]   Fax:  [phone]  Name: Ashley Savage MRN:  [id number] Date of Birth: 06/13/1941

## 2016-09-28 ENCOUNTER — Ambulatory Visit (HOSPITAL_BASED_OUTPATIENT_CLINIC_OR_DEPARTMENT_OTHER): Payer: PPO | Admitting: Family

## 2016-09-28 ENCOUNTER — Ambulatory Visit: Payer: PPO | Admitting: Physical Therapy

## 2016-09-28 ENCOUNTER — Ambulatory Visit: Payer: PPO | Admitting: Hematology & Oncology

## 2016-09-28 ENCOUNTER — Other Ambulatory Visit (HOSPITAL_BASED_OUTPATIENT_CLINIC_OR_DEPARTMENT_OTHER): Payer: PPO

## 2016-09-28 VITALS — BP 128/46 | HR 82 | Temp 97.9°F | Wt 148.0 lb

## 2016-09-28 VITALS — BP 134/68 | HR 90

## 2016-09-28 DIAGNOSIS — C50911 Malignant neoplasm of unspecified site of right female breast: Secondary | ICD-10-CM | POA: Diagnosis not present

## 2016-09-28 DIAGNOSIS — M79604 Pain in right leg: Secondary | ICD-10-CM

## 2016-09-28 DIAGNOSIS — C50019 Malignant neoplasm of nipple and areola, unspecified female breast: Secondary | ICD-10-CM

## 2016-09-28 DIAGNOSIS — M6281 Muscle weakness (generalized): Secondary | ICD-10-CM

## 2016-09-28 DIAGNOSIS — K7290 Hepatic failure, unspecified without coma: Secondary | ICD-10-CM

## 2016-09-28 DIAGNOSIS — Z170 Estrogen receptor positive status [ER+]: Principal | ICD-10-CM

## 2016-09-28 DIAGNOSIS — R2681 Unsteadiness on feet: Secondary | ICD-10-CM

## 2016-09-28 DIAGNOSIS — Z853 Personal history of malignant neoplasm of breast: Secondary | ICD-10-CM

## 2016-09-28 DIAGNOSIS — K7682 Hepatic encephalopathy: Secondary | ICD-10-CM

## 2016-09-28 DIAGNOSIS — R262 Difficulty in walking, not elsewhere classified: Secondary | ICD-10-CM

## 2016-09-28 LAB — CBC WITH DIFFERENTIAL (CANCER CENTER ONLY)
BASO#: 0.1 10*3/uL (ref 0.0–0.2)
BASO%: 0.5 % (ref 0.0–2.0)
EOS%: 2.7 % (ref 0.0–7.0)
Eosinophils Absolute: 0.3 10*3/uL (ref 0.0–0.5)
HCT: 42.9 % (ref 34.8–46.6)
HEMOGLOBIN: 14.9 g/dL (ref 11.6–15.9)
LYMPH#: 1.8 10*3/uL (ref 0.9–3.3)
LYMPH%: 19.6 % (ref 14.0–48.0)
MCH: 33.3 pg (ref 26.0–34.0)
MCHC: 34.7 g/dL (ref 32.0–36.0)
MCV: 96 fL (ref 81–101)
MONO#: 1.4 10*3/uL — AB (ref 0.1–0.9)
MONO%: 15.2 % — ABNORMAL HIGH (ref 0.0–13.0)
NEUT%: 62 % (ref 39.6–80.0)
NEUTROS ABS: 5.8 10*3/uL (ref 1.5–6.5)
PLATELETS: 120 10*3/uL — AB (ref 145–400)
RBC: 4.48 10*6/uL (ref 3.70–5.32)
RDW: 13.8 % (ref 11.1–15.7)
WBC: 9.3 10*3/uL (ref 3.9–10.0)

## 2016-09-28 LAB — COMPREHENSIVE METABOLIC PANEL (CC13)
A/G RATIO: 1.2 (ref 1.2–2.2)
ALBUMIN: 2.9 g/dL — AB (ref 3.5–4.8)
ALT: 26 IU/L (ref 0–32)
AST (SGOT): 37 IU/L (ref 0–40)
Alkaline Phosphatase, S: 201 IU/L — ABNORMAL HIGH (ref 39–117)
BILIRUBIN TOTAL: 2.3 mg/dL — AB (ref 0.0–1.2)
BUN / CREAT RATIO: 21 (ref 12–28)
BUN: 17 mg/dL (ref 8–27)
CALCIUM: 9.9 mg/dL (ref 8.7–10.3)
CHLORIDE: 91 mmol/L — AB (ref 96–106)
Carbon Dioxide, Total: 28 mmol/L (ref 18–29)
Creatinine, Ser: 0.82 mg/dL (ref 0.57–1.00)
GFR, EST AFRICAN AMERICAN: 82 mL/min/{1.73_m2} (ref >59)
GFR, EST NON AFRICAN AMERICAN: 71 mL/min/{1.73_m2} (ref >59)
GLUCOSE: 90 mg/dL (ref 65–99)
Globulin, Total: 2.5 g/dL (ref 1.5–4.5)
Potassium, Ser: 5.1 mmol/L (ref 3.5–5.2)
Sodium: 135 mmol/L (ref 134–144)
Total Protein: 5.4 g/dL — ABNORMAL LOW (ref 6.0–8.5)

## 2016-09-28 NOTE — Therapy (Signed)
[facility] [address]  [place], [place], [address] Phone: [phone]   Fax:  [phone]  Physical Therapy Treatment  Patient Details  Name: Ashley Savage MRN: [id number] Date of Birth: [birth year] Referring Provider: Karlton Lemon, MD  Encounter Date: 09/28/2016      PT End of Session - 09/28/16 1501    Visit Number 10   Number of Visits 16   Date for PT Re-Evaluation 10/23/16   Authorization Type HT Ad[employer]age VL: Follow Medicare Guidelines   PT Start Time 1501   PT Stop Time 1552   PT Time Calculation (min) 51 min   Activity Tolerance Patient tolerated treatment well   Behavior During Therapy [facility] for tasks assessed/performed      Past Medical History:  Diagnosis Date  . Anxiety   . Arthritis of both knees 10/01/2013  . Benign paroxysmal positional vertigo 10/01/2013  . Cancer [facility]) breast ca  right  . COPD (chronic obstructive pulmonary disease) ([place]) 10/01/2013  . Depression   . Diabetes mellitus type 2  . Emphysema   . Encephalopathy, hepatic ([place]) 06/07/2014  . Esophageal reflux 10/01/2013  . Fall 07/30/2016  . Hyperlipidemia   . Hyperlipidemia, mixed   . Increased ammonia level 11/25/2014  . NASH (nonalcoholic steatohepatitis) 08/26/2015  . Neck pain 10/01/2013  . Neuropathy (HCC)    feet   . Overactive bladder 12/10/2013  . Panic attacks   . Pedal edema 12/10/2013  . Preventative health care 03/08/2016  . Tobacco abuse disorder 02/01/2014    Past Surgical History:  Procedure Laterality Date  . APPENDECTOMY  2007  . BREAST SURGERY  2009 right  . CATARACT EXTRACTION     x 2  . ESOPHAGOGASTRODUODENOSCOPY (EGD) WITH PROPOFOL N/A 08/14/2016   Procedure: ESOPHAGOGASTRODUODENOSCOPY (EGD) WITH PROPOFOL;  Surgeon: Mauri Pole, MD;  [facility];  Service: Endoscopy;  Laterality: N/A;  . [place]  . KNEE SURGERY    . MANDIBLE FRACTURE SURGERY    . PILONIDAL CYST EXCISION    .  TONSILLECTOMY      Vitals:   09/28/16 1506  BP: 134/68  Pulse: 90  SpO2: 95%        Subjective Assessment - 09/28/16 1506    Subjective Pt reports her DBP was low at her MD visit upstairs (oncology). Record review indicated BP of 128/46.   Patient Stated Goals "want to know what is wrong and get it fixed"   Currently in Pain? Yes   Pain Score 2    Pain Location Sacrum   Pain Orientation Right   Pain Descriptors / Indicators Pressure   Pain Type Acute pain   Aggravating Factors  getting in/out of car   Pain Relieving Factors rest   Effect of Pain on Daily Activities difficulty with transitions            [facility] PT Assessment - 09/28/16 1501      Assessment   Medical Diagnosis Lower Back Injury   Referring Provider Karlton Lemon, MD   Onset Date/Surgical Date 07/19/16   Next MD Visit 10/13/16     Observation/Other Assessments   Focus on Therapeutic Outcomes (FOTO)  Lumbar Spine 52% (48% limitation)      Strength   Overall Strength Comments tested in sitting   Right Hip Flexion 3+/5   Right Hip Extension 3-/5   Right Hip External Rotation  3+/5   Right Hip Internal Rotation 3+/5  Right Hip ABduction 3/5   Right Hip ADduction 3+/5   Left Hip Flexion 4/5   Left Hip Extension 3+/5   Left Hip External Rotation 4-/5   Left Hip Internal Rotation 4-/5   Left Hip ABduction 4-/5   Left Hip ADduction 4-/5   Right Knee Flexion 4-/5   Right Knee Extension 3+/5   Left Knee Flexion 4-/5  knee pain with resistance   Left Knee Extension 4/5     Ambulation/Gait   Ambulation/Gait Assistance 4: Min guard   Ambulation Distance (Feet) 120 Feet   Assistive device Straight cane   Gait Pattern Step-to pattern;Decreased weight shift to right;Decreased stance time - right;Decreased step length - left   Ambulation Surface Level;Indoor   Gait velocity 2.07 ft/sec with RW; 1.49 ft/sec     Standardized Balance Assessment   Standardized Balance Assessment 10 meter walk test;Timed  Up and Go Test   10 Meter Walk 15.84" with RW; 22.09" with SPC     Timed Up and Go Test   TUG Normal TUG   Normal TUG (seconds) 31.87  with RW; 32.03" with SPC                               PT Short Term Goals - 09/21/16 1423      PT SHORT TERM GOAL #1   Title Pt will be independent with basic HEP by 09/25/16.   Status Achieved     PT SHORT TERM GOAL #2   Title Pt will be able to demonstrate safe gait pattern with rolling walker by 09/25/16.   Status Achieved           PT Long Term Goals - 09/28/16 1525      PT LONG TERM GOAL #1   Title pt will be independent with advanced HEP by 10/23/16.   Status On-going     PT LONG TERM GOAL #2   Title Pt will have increased R hip & knee strength to >/= 4-/5 to improve function by 10/23/16.   Status On-going     PT LONG TERM GOAL #3   Title pt will be able to ambulate community distances with cane (or least restricitive device) safely and independently by 10/23/16.    Status On-going     PT LONG TERM GOAL #4   Title pt will report at least 50% reduction in R leg and back pain with weight-bearing activities by 10/23/16.   Status Achieved  60-70% improvement reported      PT LONG TERM GOAL #5   Title Pt will improve gait speed to at least 1.8 ft/sec to reduce fall risk by 10/23/16.   Status Partially Met  2.06 ft/sec with RW; 1.48 ft/sec with [facility]               Plan - 09/28/16 1559    Clinical Impression Statement Pt demonstrating good progress thus far with PT. Pain has become less intense and more intermittent. LE strength improving but still demonstrates significant weakness in hips and knees, R > L. Pt reporting she is starting to wean from RW at home, but gait assessment with SPC in place of RW demonstrates significantly reduced gait speed and increased unsteadiness, along with rapid fatigue, all indicating continued high risk for falls. Recommended continued use of RW currently. Pt continues to  demonstrate good potential to benefit from further PT for continued strengthening, balance, gait safety with weaning of  AD as appropriate.   Rehab Potential Good   Clinical Impairments Affecting Rehab Potential COPD, DM, high fall risk, H/O BBPV, H/O R knee pain, Neuropathy in feet, inability to be mobile for 6 weeks   PT Treatment/Interventions Patient/family education;ADLs/Self Care Home Management;Therapeutic exercise;Therapeutic activities;Balance training;Gait training;Stair training;Functional mobility training;Manual techniques;Moist Heat;Cryotherapy;Electrical Stimulation;Iontophoresis 43m/ml Dexamethasone;Neuromuscular re-education;DME Instruction;Taping;Dry needling   PT Next Visit Plan Reassess SIJ alignment PRN; gentle strengthening of the core & lower extremities; gait training to increase tolerance for weight bearing; manual therapy & modalities PRN.   Consulted and Agree with Plan of Care Patient      Patient will benefit from skilled therapeutic intervention in order to improve the following deficits and impairments:  Pain, Increased muscle spasms, Decreased strength, Decreased mobility, Decreased balance, Decreased activity tolerance, Decreased endurance, Difficulty walking, Postural dysfunction, Cardiopulmonary status limiting activity, Abnormal gait, Decreased knowledge of use of DME, Decreased safety awareness  Visit Diagnosis: Pain in right leg  Muscle weakness (generalized)  Difficulty in walking, not elsewhere classified  Unsteadiness on feet       G-Codes - 02018/03/231606    Functional Assessment Tool Used (Outpatient Only) Lumbar Spine FOTO = 52% (48% limitation)  + Clinical judgement   Functional Limitation Mobility: Walking and moving around   Mobility: Walking and Moving Around Current Status (([id number] At least 60 percent but less than 80 percent impaired, limited or restricted   Mobility: Walking and Moving Around Goal Status ([id number] At least 40 percent but less  than 60 percent impaired, limited or restricted      Problem List Patient Active Problem List   Diagnosis Date Noted  . 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 Portal hypertensive gastropathy   . Lower back injury, initial encounter 08/05/2016  . Fall 07/30/2016  . Preventative health care 03/08/2016  . Muscle spasm 02/25/2016  . Chronic respiratory failure ([place] 08/29/2015  . NASH (nonalcoholic steatohepatitis) 08/26/2015  . Type 2 diabetes mellitus with hyperglycemia, with long-term current use of insulin ([place]   . Liver cirrhosis secondary to NASH ([place] 07/31/2015  . Diarrhea 12/30/2014  . Increased ammonia level 11/25/2014  . Diabetes mellitus type 2, controlled ([place] 10/16/2014  . Superficial bruising 10/04/2014  . Encephalopathy, hepatic ([place] 06/07/2014  . Right knee pain 06/07/2014  . Peripheral edema 03/30/2014  . Sun-damaged skin 02/01/2014  . Anxiety and depression 02/01/2014  . Tobacco abuse disorder 02/01/2014  . Medicare annual wellness visit, subsequent 02/01/2014  . Pedal edema 12/10/2013  . Overactive bladder 12/10/2013  . Abdominal aortic aneurysm ([place] 10/01/2013  . Arthritis of both knees 10/01/2013  . Benign paroxysmal positional vertigo 10/01/2013  . Neck pain 10/01/2013  . Esophageal reflux 10/01/2013  . Thrombocytopenia ([place] 03/17/2012  . 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   . Hyperlipidemia, mixed   . Breast cancer ([facility]     JPercival Spanish PT, MPT 303/23/18 4:11 PM  [facility] [place] [address]Phone: [phone]  Fax:  [phone] Name: Ashley HARTGROVEMRN: [id number] of Birth: [birth year]

## 2016-09-28 NOTE — Progress Notes (Signed)
Hematology and Oncology Follow Up Visit  Ashley Savage [id number] 07-Dec-1940 76 y.o. 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 Unfortunately, she had a fall while carrying food right before Christmas and is still recuperating. She states that her spine was out of alignment after and she has been going to PT twice a week which is helping. She states that her pain is well controlled. She uses a walker when ambulating for support.  Breast exam today showed no mass, lesion rash or redness. She still has some thickening around the areola of the left breast. No change to right mastectomy site. No lymphadenopathy found on exam.  No episodes of bleeding, bruising or petechiae.  No fever, chills, n/v, cough, rash, dizziness, headache, vision changes, chest pain, palpitations, abdominal pain or changes in bowel or bladder habits.  She had significant swelling in her lower extremities and recently started lasix BID and aldactone. The swelling is improved but she still has pitting edema. Pedal pulses are +1. With the diuretics she has lost 11 lbs since her last visit. She states that she is supposed to stay around the 145 lb mark.  She has maintained a good appetite and is staying well hydrated.  She has cirrhosis and history of hepatic encephalopathy. Last ammonia level was 52. She is on Lactulose as well as Xifaxan.  She is still smoking and her SOB with over exertion is unchanged. She takes time to rest as needed.    Medications:  Allergies as of 09/28/2016      Reactions   Citalopram    Irregular heart beat   Erythromycin    Stomach cramps   Glimepiride    Elevated ammonia levels   Prednisone    Increased blood sugars too high   Versed [midazolam]  Other (See Comments)   Patient stayed confusion stayed 4+days       Medication List       Accurate as of 09/28/16  1:53 PM. Always use your most recent med list.          albuterol 1.25 MG/3ML nebulizer solution Commonly known as:  ACCUNEB Take 1 ampule by nebulization every 6 (six) hours as needed for wheezing.   albuterol 108 (90 Base) MCG/ACT inhaler Commonly known as:  PROVENTIL HFA;VENTOLIN HFA Inhale 2 puffs into the lungs every 6 (six) hours as needed for wheezing or shortness of breath. Only dispense Ventolin   BD PEN NEEDLE NANO U/F 32G X 4 MM Misc Generic drug:  Insulin Pen Needle USE AS DIRECTED WITH LEVEMIR FLEXPEN   cetirizine 10 MG tablet Commonly known as:  ZYRTEC Take 10 mg by mouth daily as needed for allergies.   furosemide 20 MG tablet Commonly known as:  LASIX TAKE 1 TABLET BY MOUTH TWICE DAILY   insulin aspart 100 UNIT/ML injection Commonly known as:  NOVOLOG 4 units SQ q lunch daily and prn Sliding scale:  BS <200 no units BS 201-250 use 2 units BS 251-300 use 4 units BS 301-350 use 6 units BS 351-400 use 8 units BS 401-450 use 10 units BS>451 call MD   insulin lispro 100 UNIT/ML injection Commonly known as:  HUMALOG Inject into the skin.   insulin lispro 100 UNIT/ML injection Commonly known as:  HUMALOG Inject  into the skin.   Insulin Syringe-Needle U-100 31G X 5/16" 1 ML Misc To use w/ Novolog   lactulose 10 GM/15ML solution Commonly known as:  CHRONULAC TAKE 45 MLS BY MOUTH TWICE DAILY AS NEEDED FOR MILD CONSTIPATION.   LEVEMIR FLEXTOUCH 100 UNIT/ML Pen Generic drug:  Insulin Detemir INJECT 14 UNITS UNDER THE SKIN EVERY DAY AT 10PM   LIDOCAINE EX Apply 1 application topically at bedtime as needed (pain).   LUBRICANT DROPS OP Apply 1 drop to eye daily as needed (dry eyes).   omeprazole 20 MG capsule Commonly known as:  PRILOSEC TAKE 1 CAPSULE(20 MG) BY MOUTH DAILY   ONE TOUCH ULTRA 2 w/Device Kit Use as directed once daily to  check blood sugar.  DX E11.9   ONE TOUCH ULTRA TEST test strip Generic drug:  glucose blood USE TWICE DAILY TO CHECK BLOOD SUGAR AS DIRECTED   rifaximin 550 MG Tabs tablet Commonly known as:  XIFAXAN Take 1 tablet (550 mg total) by mouth 2 (two) times daily.   sodium chloride 0.65 % Soln nasal spray Commonly known as:  OCEAN Place 1 spray into both nostrils as needed for congestion.   SPIRIVA RESPIMAT 2.5 MCG/ACT Aers Generic drug:  Tiotropium Bromide Monohydrate Inhale 2.5 mcg into the lungs daily as needed.   spironolactone 50 MG tablet Commonly known as:  ALDACTONE TAKE 2 TABLETS(100 MG) BY MOUTH DAILY   tiZANidine 4 MG tablet Commonly known as:  ZANAFLEX Take 1 tablet (4 mg total) by mouth every 8 (eight) hours as needed for muscle spasms.   traMADol 100 MG 24 hr tablet Commonly known as:  ULTRAM-ER Take 1 tablet (100 mg total) by mouth daily as needed for pain.   Vitamin D3 2000 units Tabs Take 2,000 Units by mouth every morning.       Allergies:  Allergies  Allergen Reactions  . Citalopram     Irregular heart beat  . Erythromycin     Stomach cramps  . Glimepiride     Elevated ammonia levels  . Prednisone     Increased blood sugars too high  . Versed [Midazolam] Other (See Comments)    Patient stayed confusion stayed 4+days     Past Medical History, Surgical history, Social history, and Family History were reviewed and updated.  Review of Systems: All other 10 point review of systems is negative.   Physical Exam:  vitals were not taken for this visit.  Wt Readings from Last 3 Encounters:  09/15/16 150 lb (68 kg)  09/06/16 156 lb (70.8 kg)  08/14/16 156 lb (70.8 kg)    Ocular: Sclerae unicteric, pupils equal, round and reactive to light Ear-nose-throat: Oropharynx clear, dentition fair Lymphatic: No cervical supraclavicular or axillary adenopathy Lungs clear throughout, no rales or rhonchi, good excursion bilaterally Heart regular rate and  rhythm, no murmur appreciated Abd soft, nontender, positive bowel sounds, no liver or spleen tip palpated on exam, no fluid wave MSK no focal spinal tenderness, no joint edema Neuro: non-focal, well-oriented, appropriate affect Breasts: She has thickening around the areola of the left breast. No erythema, mass or rash noted. No change with the right chest s/p mastectomy.    Lab Results  Component Value Date   WBC 9.3 09/28/2016   HGB 14.9 09/28/2016   HCT 42.9 09/28/2016   MCV 96 09/28/2016   PLT 120 (L) 09/28/2016   Lab Results  Component Value Date   FERRITIN 16 07/31/2015   IRON 54 07/31/2015   TIBC 344  07/31/2015   UIBC 290 07/31/2015   IRONPCTSAT 16 07/31/2015   Lab Results  Component Value Date   RBC 4.48 09/28/2016   No results found for: Nils Pyle, [facility] Lab Results  Component Value Date   IGGSERUM TEST NOT PERFORMED 01/24/2015   IGGSERUM 835 01/24/2015   No results found for: Odetta Pink, SPEI   Chemistry      Component Value Date/Time   NA 136 09/06/2016 2155   NA 139 06/29/2016 1128   K 4.6 09/06/2016 2155   K 5.0 06/29/2016 1128   CL 102 09/06/2016 2155   CL 104 03/28/2014 1008   CO2 26 09/06/2016 2155   CO2 24 06/29/2016 1128   BUN 22 (H) 09/06/2016 2155   BUN 15.9 06/29/2016 1128   CREATININE 0.83 09/06/2016 2155   CREATININE 1.1 06/29/2016 1128      Component Value Date/Time   CALCIUM 9.5 09/06/2016 2155   CALCIUM 9.0 06/29/2016 1128   ALKPHOS 249 (H) 09/06/2016 2155   ALKPHOS 129 06/29/2016 1128   AST 50 (H) 09/06/2016 2155   AST 36 (H) 06/29/2016 1128   ALT 26 09/06/2016 2155   ALT 24 06/29/2016 1128   BILITOT 3.0 (H) 09/06/2016 2155   BILITOT 1.78 (H) 06/29/2016 1128     Impression and Plan: She is a 76 yo white female with history of 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 Her tumor was ER positive and she completed 5 years of Femara in  February 2015. She has some thickening still around the left areaola but no longer has erythema and no pain.  She will get a 3D mammogram of the left breast this week to further assess. She missed her other mammogram appointments due to her fall.  She is doing well in PT and healing from her fall prior to Christmas.  We will go ahead and plan to see her back in 3 months for repeat lab work and follow-up.  She is in agreement with the plan and will contact us with any questions or concerns. We can certaily see her sooner if need be.      Eliezer Bottom, NP 3/5/20181:53 PM

## 2016-09-29 LAB — LACTATE DEHYDROGENASE: LDH: 257 U/L — AB (ref 125–245)

## 2016-10-01 ENCOUNTER — Ambulatory Visit: Payer: PPO | Admitting: Physical Therapy

## 2016-10-01 DIAGNOSIS — M79604 Pain in right leg: Secondary | ICD-10-CM | POA: Diagnosis not present

## 2016-10-01 DIAGNOSIS — R262 Difficulty in walking, not elsewhere classified: Secondary | ICD-10-CM

## 2016-10-01 DIAGNOSIS — M6281 Muscle weakness (generalized): Secondary | ICD-10-CM

## 2016-10-01 DIAGNOSIS — R2681 Unsteadiness on feet: Secondary | ICD-10-CM

## 2016-10-01 NOTE — Therapy (Signed)
[facility] [address]  [place], [place], [address] Phone: [phone]   Fax:  [phone]  Physical Therapy Treatment  Patient Details  Name: MELANIA KIRKS MRN: [id number] Date of Birth: [birth year] Referring Provider: Karlton Lemon, MD  Encounter Date: 10/01/2016      PT End of Session - 10/01/16 1359    Visit Number 11   Number of Visits 16   Date for PT Re-Evaluation 10/23/16   Authorization Type [employer] VL: Follow Medicare Guidelines   PT Start Time 1359   PT Stop Time 1449   PT Time Calculation (min) 50 min   Activity Tolerance Patient tolerated treatment well   Behavior During Therapy [facility] for tasks assessed/performed      Past Medical History:  Diagnosis Date  . Anxiety   . Arthritis of both knees 10/01/2013  . Benign paroxysmal positional vertigo 10/01/2013  . Cancer [facility]) breast ca  right  . COPD (chronic obstructive pulmonary disease) ([place]) 10/01/2013  . Depression   . Diabetes mellitus type 2  . Emphysema   . Encephalopathy, hepatic ([place]) 06/07/2014  . Esophageal reflux 10/01/2013  . Fall 07/30/2016  . Hyperlipidemia   . Hyperlipidemia, mixed   . Increased ammonia level 11/25/2014  . NASH (nonalcoholic steatohepatitis) 08/26/2015  . Neck pain 10/01/2013  . Neuropathy (HCC)    feet   . Overactive bladder 12/10/2013  . Panic attacks   . Pedal edema 12/10/2013  . Preventative health care 03/08/2016  . Tobacco abuse disorder 02/01/2014    Past Surgical History:  Procedure Laterality Date  . APPENDECTOMY  2007  . BREAST SURGERY  2009 right  . CATARACT EXTRACTION     x 2  . ESOPHAGOGASTRODUODENOSCOPY (EGD) WITH PROPOFOL N/A 08/14/2016   Procedure: ESOPHAGOGASTRODUODENOSCOPY (EGD) WITH PROPOFOL;  Surgeon: Mauri Pole, MD;  Location: [facility];  Service: Endoscopy;  Laterality: N/A;  . [place]  . KNEE SURGERY    . MANDIBLE FRACTURE SURGERY    . PILONIDAL CYST EXCISION    .  TONSILLECTOMY      There were no vitals filed for this visit.      Subjective Assessment - 10/01/16 1402    Subjective Pt reports the only trouble today is her knee. Still has "a bit of a twinge" in the groin when initiating movement. States she was able to get into the car today w/o having to manually lift her leg.   How long can you stand comfortably? 5 minutes with support   How long can you walk comfortably? with walker, notes more limitation from SOB than pain   Patient Stated Goals "want to know what is wrong and get it fixed"   Currently in Pain? Yes   Pain Score 2    Pain Location Groin   Pain Orientation Right                         [facility] Adult PT Treatment/Exercise - 10/01/16 1359      Ambulation/Gait   Ambulation/Gait Assistance 4: Min guard   Ambulation/Gait Assistance Details Cues for proper sequencing and placement of SPC.   Ambulation Distance (Feet) 400 Feet  1 seated rest break   Assistive device Straight cane   Gait Pattern Step-to pattern;Decreased weight shift to right;Decreased stance time - right;Decreased step length - left   Ambulation Surface Level;Indoor   Gait velocity --     Knee/Hip  Exercises: Aerobic   Nustep lvl 5 x 6'     Knee/Hip Exercises: Standing   Heel Raises Both;15 reps;3 seconds   Heel Raises Limitations UE support on counter    Hip Flexion Both;15 reps;Knee bent   Hip Flexion Limitations marching at counter   Hip Abduction Both;10 reps;Knee straight   Abduction Limitations UE support on counter    Hip Extension Both;10 reps;Knee straight   Extension Limitations UE support on counter              Balance Exercises - 10/01/16 1359      Balance Exercises: Standing   Standing Eyes Closed Narrow base of support (BOS);Solid surface;20 secs  rhomberg   Tandem Stance Eyes open;Intermittent upper extremity support;20 secs   Partial Tandem Stance Eyes closed;20 secs  rhomberg           PT Education -  10/01/16 1445    Education Details HEP update - standing exercises at counter   Person(s) Educated Patient   Methods Explanation;Demonstration;Handout   Comprehension Verbalized understanding;Returned demonstration;Need further instruction          PT Short Term Goals - 09/21/16 1423      PT SHORT TERM GOAL #1   Title Pt will be independent with basic HEP by 09/25/16.   Status Achieved     PT SHORT TERM GOAL #2   Title Pt will be able to demonstrate safe gait pattern with rolling walker by 09/25/16.   Status Achieved           PT Long Term Goals - 09/28/16 1525      PT LONG TERM GOAL #1   Title pt will be independent with advanced HEP by 10/23/16.   Status On-going     PT LONG TERM GOAL #2   Title Pt will have increased R hip & knee strength to >/= 4-/5 to improve function by 10/23/16.   Status On-going     PT LONG TERM GOAL #3   Title pt will be able to ambulate community distances with cane (or least restricitive device) safely and independently by 10/23/16.    Status On-going     PT LONG TERM GOAL #4   Title pt will report at least 50% reduction in R leg and back pain with weight-bearing activities by 10/23/16.   Status Achieved  60-70% improvement reported      PT LONG TERM GOAL #5   Title Pt will improve gait speed to at least 1.8 ft/sec to reduce fall risk by 10/23/16.   Status Partially Met  2.06 ft/sec with RW; 1.48 ft/sec with [facility]               Plan - 10/01/16 1449    Clinical Impression Statement Pain remains decreased today, with pt reporting improving functional mobility and increased ease of car transfers, no longer requiring UE assist to lift LE into car. Pt able to tolerate progression of standing exercises w/o increased pain, therefore provided update to HEP to include basic standing exercises. Pt continues to require periodic seated rest breaks during therapy due to fatigue and SOB from COPD.   Rehab Potential Good   Clinical Impairments Affecting  Rehab Potential COPD, DM, high fall risk, H/O BBPV, H/O R knee pain, Neuropathy in feet, inability to be mobile for 6 weeks   PT Treatment/Interventions Patient/family education;ADLs/Self Care Home Management;Therapeutic exercise;Therapeutic activities;Balance training;Gait training;Stair training;Functional mobility training;Manual techniques;Moist Heat;Cryotherapy;Electrical Stimulation;Iontophoresis 44m/ml Dexamethasone;Neuromuscular re-education;DME Instruction;Taping;Dry needling   PT Next Visit  Plan Review & assess response to HEP update; Reassess SIJ alignment PRN; gentle strengthening of the core & lower extremities; gait training to increase tolerance for weight bearing; manual therapy & modalities PRN.   Consulted and Agree with Plan of Care Patient      Patient will benefit from skilled therapeutic intervention in order to improve the following deficits and impairments:  Pain, Increased muscle spasms, Decreased strength, Decreased mobility, Decreased balance, Decreased activity tolerance, Decreased endurance, Difficulty walking, Postural dysfunction, Cardiopulmonary status limiting activity, Abnormal gait, Decreased knowledge of use of DME, Decreased safety awareness  Visit Diagnosis: Pain in right leg  Muscle weakness (generalized)  Difficulty in walking, not elsewhere classified  Unsteadiness on feet     Problem List Patient Active Problem List   Diagnosis Date Noted  . 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 Portal hypertensive gastropathy   . Lower back injury, initial encounter 08/05/2016  . Fall 07/30/2016  . Preventative health care 03/08/2016  . Muscle spasm 02/25/2016  . Chronic respiratory failure ([place]) 08/29/2015  . NASH (nonalcoholic steatohepatitis) 08/26/2015  . Type 2 diabetes mellitus with hyperglycemia, with long-term current use of insulin ([place])   . Liver cirrhosis secondary to NASH ([place]) 07/31/2015  . Diarrhea 12/30/2014  . Increased ammonia level  11/25/2014  . Diabetes mellitus type 2, controlled ([place]) 10/16/2014  . Superficial bruising 10/04/2014  . Encephalopathy, hepatic ([place]) 06/07/2014  . Right knee pain 06/07/2014  . Peripheral edema 03/30/2014  . Sun-damaged skin 02/01/2014  . Anxiety and depression 02/01/2014  . Tobacco abuse disorder 02/01/2014  . Medicare annual wellness visit, subsequent 02/01/2014  . Pedal edema 12/10/2013  . Overactive bladder 12/10/2013  . Abdominal aortic aneurysm ([place]) 10/01/2013  . Arthritis of both knees 10/01/2013  . Benign paroxysmal positional vertigo 10/01/2013  . Neck pain 10/01/2013  . Esophageal reflux 10/01/2013  . Thrombocytopenia ([place]) 03/17/2012  . 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   . Hyperlipidemia, mixed   . Breast cancer [facility])     Percival Spanish, PT, MPT 10/01/2016, 6:26 PM  [facility] [address]  [place], [place], [address] Phone: [phone]   Fax:  [phone]  Name: ALBERTIA CARVIN MRN: [id number] Date of Birth: 01-02-1941

## 2016-10-05 ENCOUNTER — Ambulatory Visit: Payer: PPO | Admitting: Physical Therapy

## 2016-10-05 DIAGNOSIS — R2681 Unsteadiness on feet: Secondary | ICD-10-CM

## 2016-10-05 DIAGNOSIS — M6281 Muscle weakness (generalized): Secondary | ICD-10-CM

## 2016-10-05 DIAGNOSIS — M79604 Pain in right leg: Secondary | ICD-10-CM | POA: Diagnosis not present

## 2016-10-05 DIAGNOSIS — R262 Difficulty in walking, not elsewhere classified: Secondary | ICD-10-CM

## 2016-10-05 NOTE — Therapy (Signed)
[facility] [address]  [place], [place], [address] Phone: [phone]   Fax:  [phone]  Physical Therapy Treatment  Patient Details  Name: Ashley Savage MRN: [id number] Date of Birth: 07/15/1941 Referring Provider: Karlton Lemon, MD  Encounter Date: 10/05/2016      PT End of Session - 10/05/16 1400    Visit Number 12   Number of Visits 16   Date for PT Re-Evaluation 10/23/16   Authorization Type [employer] VL: Follow Medicare Guidelines   PT Start Time 1400   PT Stop Time 1444   PT Time Calculation (min) 44 min   Activity Tolerance Patient tolerated treatment well   Behavior During Therapy [facility] for tasks assessed/performed      Past Medical History:  Diagnosis Date  . Anxiety   . Arthritis of both knees 10/01/2013  . Benign paroxysmal positional vertigo 10/01/2013  . Cancer [facility]) breast ca  right  . COPD (chronic obstructive pulmonary disease) ([place]) 10/01/2013  . Depression   . Diabetes mellitus type 2  . Emphysema   . Encephalopathy, hepatic ([place]) 06/07/2014  . Esophageal reflux 10/01/2013  . Fall 07/30/2016  . Hyperlipidemia   . Hyperlipidemia, mixed   . Increased ammonia level 11/25/2014  . NASH (nonalcoholic steatohepatitis) 08/26/2015  . Neck pain 10/01/2013  . Neuropathy (HCC)    feet   . Overactive bladder 12/10/2013  . Panic attacks   . Pedal edema 12/10/2013  . Preventative health care 03/08/2016  . Tobacco abuse disorder 02/01/2014    Past Surgical History:  Procedure Laterality Date  . APPENDECTOMY  2007  . BREAST SURGERY  2009 right  . CATARACT EXTRACTION     x 2  . ESOPHAGOGASTRODUODENOSCOPY (EGD) WITH PROPOFOL N/A 08/14/2016   Procedure: ESOPHAGOGASTRODUODENOSCOPY (EGD) WITH PROPOFOL;  Surgeon: Mauri Pole, MD;  Location: [facility];  Service: Endoscopy;  Laterality: N/A;  . [place]  . KNEE SURGERY    . MANDIBLE FRACTURE SURGERY    . PILONIDAL CYST EXCISION     . TONSILLECTOMY      There were no vitals filed for this visit.      Subjective Assessment - 10/05/16 1410    Subjective Pt states pain mostly just with starting motion at this point but otherwise not really hurting anymore.   Patient Stated Goals "want to know what is wrong and get it fixed"   Currently in Pain? Yes   Pain Score 1    Pain Location Hip   Pain Orientation Right   Pain Descriptors / Indicators Pressure   Pain Type Acute pain   Pain Radiating Towards n/a   Pain Onset More than a month ago   Pain Frequency Intermittent                         [facility] Adult PT Treatment/Exercise - 10/05/16 1400      Ambulation/Gait   Ambulation/Gait Assistance 5: Supervision   Ambulation Distance (Feet) 220 Feet   Assistive device Straight cane   Gait Pattern Step-through pattern;Decreased step length - left   Ambulation Surface Level;Indoor   Gait Comments Pt demonstrating improving sequencing with cane with more consistent step-through gait pattern, although still with shorter step length on L.     Knee/Hip Exercises: Standing   Heel Raises Both;20 reps   Hip Flexion Both;15 reps;Knee bent   Hip Flexion Limitations 2# cuff weights, marching at counter  Hip Abduction Both;15 reps;Knee straight   Abduction Limitations 2# - UE support on counter    Hip Extension Both;15 reps;Knee straight   Extension Limitations 2# - UE support on counter              Balance Exercises - 10/05/16 1400      OTAGO PROGRAM   Head Movements Sitting;5 reps   Neck Movements Sitting;5 reps   Back Extension Standing;5 reps   Trunk Movements Standing;5 reps   Ankle Movements Standing;10 reps   Knee Extensor 10 reps   Knee Flexor 10 reps   Hip ABductor --  2# x15   Ankle Plantorflexors 20 reps, support   Ankle Dorsiflexors 20 reps, support   Knee Bends 10 reps, support   Backwards Walking Support   Walking and Turning Around Assistive device  [facility]           PT  Education - 10/05/16 1458    Education provided Yes   Education Details Initiated OTAGO fall prevention program with current exercises to be performed at home highlighted in yellow   Person(s) Educated Patient   Methods Explanation;Demonstration;Handout   Comprehension Verbalized understanding;Returned demonstration;Need further instruction          PT Short Term Goals - 09/21/16 1423      PT SHORT TERM GOAL #1   Title Pt will be independent with basic HEP by 09/25/16.   Status Achieved     PT SHORT TERM GOAL #2   Title Pt will be able to demonstrate safe gait pattern with rolling walker by 09/25/16.   Status Achieved           PT Long Term Goals - 09/28/16 1525      PT LONG TERM GOAL #1   Title pt will be independent with advanced HEP by 10/23/16.   Status On-going     PT LONG TERM GOAL #2   Title Pt will have increased R hip & knee strength to >/= 4-/5 to improve function by 10/23/16.   Status On-going     PT LONG TERM GOAL #3   Title pt will be able to ambulate community distances with cane (or least restricitive device) safely and independently by 10/23/16.    Status On-going     PT LONG TERM GOAL #4   Title pt will report at least 50% reduction in R leg and back pain with weight-bearing activities by 10/23/16.   Status Achieved  60-70% improvement reported      PT LONG TERM GOAL #5   Title Pt will improve gait speed to at least 1.8 ft/sec to reduce fall risk by 10/23/16.   Status Partially Met  2.06 ft/sec with RW; 1.48 ft/sec with [facility]               Plan - 10/05/16 1415    Clinical Impression Statement Pt continuing to report lessenening pain with pain now most common upon initiation of transitional motion/movement. Denies any issues with standing HEP and able to tolerate addition of 2# weights today. Introduced [place] fall prevention program and instructed pt in ~first half of program, with tolerance limited only due to SOB & fatigue from COPD. Will plan to  complete training as able at next visit. Pt instructed to alternate days for prior HEP and OTAGO program when completing exercises/activities at home.   Rehab Potential Good   Clinical Impairments Affecting Rehab Potential COPD, DM, high fall risk, H/O BBPV, H/O R knee pain, Neuropathy in feet,  inability to be mobile for 6 weeks   PT Treatment/Interventions Patient/family education;ADLs/Self Care Home Management;Therapeutic exercise;Therapeutic activities;Balance training;Gait training;Stair training;Functional mobility training;Manual techniques;Moist Heat;Cryotherapy;Electrical Stimulation;Iontophoresis 26m/ml Dexamethasone;Neuromuscular re-education;DME Instruction;Taping;Dry needling   PT Next Visit Plan Review and complete training in OTAGO fall prevention program; Reassess SIJ alignment PRN; gentle strengthening of the core & lower extremities; gait training to increase tolerance for weight bearing; manual therapy & modalities PRN.   Consulted and Agree with Plan of Care Patient      Patient will benefit from skilled therapeutic intervention in order to improve the following deficits and impairments:  Pain, Increased muscle spasms, Decreased strength, Decreased mobility, Decreased balance, Decreased activity tolerance, Decreased endurance, Difficulty walking, Postural dysfunction, Cardiopulmonary status limiting activity, Abnormal gait, Decreased knowledge of use of DME, Decreased safety awareness  Visit Diagnosis: Pain in right leg  Muscle weakness (generalized)  Difficulty in walking, not elsewhere classified  Unsteadiness on feet     Problem List Patient Active Problem List   Diagnosis Date Noted  . 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 Portal hypertensive gastropathy   . Lower back injury, initial encounter 08/05/2016  . Fall 07/30/2016  . Preventative health care 03/08/2016  . Muscle spasm 02/25/2016  . Chronic respiratory failure ([place] 08/29/2015  . NASH (nonalcoholic  steatohepatitis) 08/26/2015  . Type 2 diabetes mellitus with hyperglycemia, with long-term current use of insulin ([place]   . Liver cirrhosis secondary to NASH ([place] 07/31/2015  . Diarrhea 12/30/2014  . Increased ammonia level 11/25/2014  . Diabetes mellitus type 2, controlled ([place] 10/16/2014  . Superficial bruising 10/04/2014  . Encephalopathy, hepatic ([place] 06/07/2014  . Right knee pain 06/07/2014  . Peripheral edema 03/30/2014  . Sun-damaged skin 02/01/2014  . Anxiety and depression 02/01/2014  . Tobacco abuse disorder 02/01/2014  . Medicare annual wellness visit, subsequent 02/01/2014  . Pedal edema 12/10/2013  . Overactive bladder 12/10/2013  . Abdominal aortic aneurysm ([place] 10/01/2013  . Arthritis of both knees 10/01/2013  . Benign paroxysmal positional vertigo 10/01/2013  . Neck pain 10/01/2013  . Esophageal reflux 10/01/2013  . Thrombocytopenia ([place] 03/17/2012  . 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   . Hyperlipidemia, mixed   . Breast cancer ([facility]     JPercival Spanish PT, MPT 10/05/2016, 5:57 PM  [facility] [place] [address]Phone: [phone]  Fax:  [phone] Name: Ashley MONGERMRN: [id number] of Birth: [birth year]

## 2016-10-08 ENCOUNTER — Ambulatory Visit: Payer: PPO | Admitting: Physical Therapy

## 2016-10-08 DIAGNOSIS — M6281 Muscle weakness (generalized): Secondary | ICD-10-CM

## 2016-10-08 DIAGNOSIS — M79604 Pain in right leg: Secondary | ICD-10-CM | POA: Diagnosis not present

## 2016-10-08 DIAGNOSIS — R2681 Unsteadiness on feet: Secondary | ICD-10-CM

## 2016-10-08 DIAGNOSIS — R262 Difficulty in walking, not elsewhere classified: Secondary | ICD-10-CM

## 2016-10-08 NOTE — Therapy (Signed)
Ashley Savage [address]  [place], [place], [address] Phone: [phone]   Fax:  [phone]  Physical Therapy Treatment  Patient Details  Name: AKOSUA CONSTANTINE MRN: [id number] Date of Birth: 05/31/1941 Referring Provider: Karlton Lemon, MD  Encounter Date: 10/08/2016      PT End of Session - 10/08/16 1400    Visit Number 13   Number of Visits 16   Date for PT Re-Evaluation 10/23/16   Authorization Type HT Advantage VL: Follow Medicare Guidelines   PT Start Time 1400   PT Stop Time 1446   PT Time Calculation (min) 46 min   Activity Tolerance Patient tolerated treatment well   Behavior During Therapy [facility] for tasks assessed/performed      Past Medical History:  Diagnosis Date  . Anxiety   . Arthritis of both knees 10/01/2013  . Benign paroxysmal positional vertigo 10/01/2013  . Cancer [facility]) breast ca  right  . COPD (chronic obstructive pulmonary disease) ([place]) 10/01/2013  . Depression   . Diabetes mellitus type 2  . Emphysema   . Encephalopathy, hepatic ([place]) 06/07/2014  . Esophageal reflux 10/01/2013  . Fall 07/30/2016  . Hyperlipidemia   . Hyperlipidemia, mixed   . Increased ammonia level 11/25/2014  . NASH (nonalcoholic steatohepatitis) 08/26/2015  . Neck pain 10/01/2013  . Neuropathy (HCC)    feet   . Overactive bladder 12/10/2013  . Panic attacks   . Pedal edema 12/10/2013  . Preventative health care 03/08/2016  . Tobacco abuse disorder 02/01/2014    Past Surgical History:  Procedure Laterality Date  . APPENDECTOMY  2007  . BREAST SURGERY  2009 right  . CATARACT EXTRACTION     x 2  . ESOPHAGOGASTRODUODENOSCOPY (EGD) WITH PROPOFOL N/A 08/14/2016   Procedure: ESOPHAGOGASTRODUODENOSCOPY (EGD) WITH PROPOFOL;  Surgeon: Mauri Pole, MD;  [facility];  Service: Endoscopy;  Laterality: N/A;  . [place]  . KNEE SURGERY    . MANDIBLE FRACTURE SURGERY    . PILONIDAL CYST EXCISION     . TONSILLECTOMY      There were no vitals filed for this visit.      Subjective Assessment - 10/08/16 1405    Subjective Pt reports she feels shakier today because her cirrohsis meds have not come in to the pharmacy. No pain today and reports she was able to shave her legs for the first time since November.   Patient Stated Goals "want to know what is wrong and get it fixed"   Currently in Pain? No/denies   Pain Onset More than a month ago                         [facility] Adult PT Treatment/Exercise - 10/08/16 1400      Knee/Hip Exercises: Aerobic   Nustep lvl 6 x 6'     Knee/Hip Exercises: Standing   Hip Flexion Both;15 reps;Knee straight   Hip Flexion Limitations yellow TB   Hip Abduction Both;15 reps;Knee straight   Abduction Limitations yellow TB   Hip Extension Both;15 reps   Extension Limitations yellow TB             Balance Exercises - 10/08/16 1400      OTAGO PROGRAM   Sideways Walking Assistive device   Tandem Stance 10 seconds, support   Tandem Walk Support   One Leg Stand 10 seconds, support   Heel Walking Support  Toe Walk Support   Heel Toe Walking Backward --  Level C = Support   Sit to Stand --  deferred             PT Short Term Goals - 09/21/16 1423      PT SHORT TERM GOAL #1   Title Pt will be independent with basic HEP by 09/25/16.   Status Achieved     PT SHORT TERM GOAL #2   Title Pt will be able to demonstrate safe gait pattern with rolling walker by 09/25/16.   Status Achieved           PT Long Term Goals - 09/28/16 1525      PT LONG TERM GOAL #1   Title pt will be independent with advanced HEP by 10/23/16.   Status On-going     PT LONG TERM GOAL #2   Title Pt will have increased R hip & knee strength to >/= 4-/5 to improve function by 10/23/16.   Status On-going     PT LONG TERM GOAL #3   Title pt will be able to ambulate community distances with cane (or least restricitive device) safely and  independently by 10/23/16.    Status On-going     PT LONG TERM GOAL #4   Title pt will report at least 50% reduction in R leg and back pain with weight-bearing activities by 10/23/16.   Status Achieved  60-70% improvement reported      PT LONG TERM GOAL #5   Title Pt will improve gait speed to at least 1.8 ft/sec to reduce fall risk by 10/23/16.   Status Partially Met  2.06 ft/sec with RW; 1.48 ft/sec with [facility]               Plan - 10/08/16 1400    Clinical Impression Statement Pt able to complete training in [place] fall prevention program, with exception of stairs deferred at this time. Pt denies any concerns with current HEP or OTAGO program, therefore continued strengthening progression with yellow TB resistance added to standing exercises. Pt wishing to work toward transitioning to HEP in order to conserve PT coverage in the event she is able to have her TKR later this year.   Rehab Potential Good   Clinical Impairments Affecting Rehab Potential COPD, DM, high fall risk, H/O BBPV, H/O R knee pain, Neuropathy in feet, inability to be mobile for 6 weeks   PT Treatment/Interventions Patient/family education;ADLs/Self Care Home Management;Therapeutic exercise;Therapeutic activities;Balance training;Gait training;Stair training;Functional mobility training;Manual techniques;Moist Heat;Cryotherapy;Electrical Stimulation;Iontophoresis 9m/ml Dexamethasone;Neuromuscular re-education;DME Instruction;Taping;Dry needling   PT Next Visit Plan Review and update HEPs in prep for anticipated discharge w/in next 1-2 visits; Reassess SIJ alignment PRN; gentle strengthening of the core & lower extremities; gait training to increase tolerance for weight bearing; manual therapy & modalities PRN.   Consulted and Agree with Plan of Care Patient      Patient will benefit from skilled therapeutic intervention in order to improve the following deficits and impairments:  Pain, Increased muscle spasms, Decreased  strength, Decreased mobility, Decreased balance, Decreased activity tolerance, Decreased endurance, Difficulty walking, Postural dysfunction, Cardiopulmonary status limiting activity, Abnormal gait, Decreased knowledge of use of DME, Decreased safety awareness  Visit Diagnosis: Pain in right leg  Muscle weakness (generalized)  Difficulty in walking, not elsewhere classified  Unsteadiness on feet     Problem List Patient Active Problem List   Diagnosis Date Noted  . 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 Portal hypertensive  gastropathy   . Lower back injury, initial encounter 08/05/2016  . Fall 07/30/2016  . Preventative health care 03/08/2016  . Muscle spasm 02/25/2016  . Chronic respiratory failure ([place]) 08/29/2015  . NASH (nonalcoholic steatohepatitis) 08/26/2015  . Type 2 diabetes mellitus with hyperglycemia, with long-term current use of insulin ([place])   . Liver cirrhosis secondary to NASH ([place]) 07/31/2015  . Diarrhea 12/30/2014  . Increased ammonia level 11/25/2014  . Diabetes mellitus type 2, controlled ([place]) 10/16/2014  . Superficial bruising 10/04/2014  . Encephalopathy, hepatic ([place]) 06/07/2014  . Right knee pain 06/07/2014  . Peripheral edema 03/30/2014  . Sun-damaged skin 02/01/2014  . Anxiety and depression 02/01/2014  . Tobacco abuse disorder 02/01/2014  . Medicare annual wellness visit, subsequent 02/01/2014  . Pedal edema 12/10/2013  . Overactive bladder 12/10/2013  . Abdominal aortic aneurysm ([place]) 10/01/2013  . Arthritis of both knees 10/01/2013  . Benign paroxysmal positional vertigo 10/01/2013  . Neck pain 10/01/2013  . Esophageal reflux 10/01/2013  . Thrombocytopenia ([place]) 03/17/2012  . 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   . Hyperlipidemia, mixed   . Breast cancer [facility])     Percival Spanish, PT, MPT 10/08/2016, 3:15 PM  [facility] [address]   [place], [place], [address] Phone: [phone]   Fax:  [phone]  Name: JADIN KAGEL MRN: [id number] Date of Birth: June 02, 1941

## 2016-10-12 ENCOUNTER — Ambulatory Visit: Payer: PPO | Admitting: Physical Therapy

## 2016-10-13 ENCOUNTER — Encounter: Payer: Self-pay | Admitting: Family Medicine

## 2016-10-13 ENCOUNTER — Ambulatory Visit (INDEPENDENT_AMBULATORY_CARE_PROVIDER_SITE_OTHER): Payer: PPO | Admitting: Family Medicine

## 2016-10-13 DIAGNOSIS — M25561 Pain in right knee: Secondary | ICD-10-CM | POA: Diagnosis not present

## 2016-10-13 DIAGNOSIS — G8929 Other chronic pain: Secondary | ICD-10-CM

## 2016-10-13 NOTE — Patient Instructions (Signed)
Continue the home exercises 3-4 times a week. Call me if you're struggling with either the knee or the hip. We can consider the gel shots in your knee in the future if knee gets worse again. Follow up with me as needed otherwise.

## 2016-10-14 ENCOUNTER — Ambulatory Visit: Payer: PPO | Admitting: Pulmonary Disease

## 2016-10-15 ENCOUNTER — Telehealth: Payer: Self-pay | Admitting: Family Medicine

## 2016-10-15 ENCOUNTER — Ambulatory Visit: Payer: PPO | Admitting: Physical Therapy

## 2016-10-15 DIAGNOSIS — M6281 Muscle weakness (generalized): Secondary | ICD-10-CM

## 2016-10-15 DIAGNOSIS — M79604 Pain in right leg: Secondary | ICD-10-CM

## 2016-10-15 DIAGNOSIS — R2681 Unsteadiness on feet: Secondary | ICD-10-CM

## 2016-10-15 DIAGNOSIS — R262 Difficulty in walking, not elsewhere classified: Secondary | ICD-10-CM

## 2016-10-15 NOTE — Progress Notes (Signed)
PCP: Penni Homans, MD  Subjective:   HPI: Patient is a 76 y.o. female here for right knee pain.  2/20: Patient reports she is overall doing well with PT for her hip. However, started to get pain over past few weeks in anterior right knee. Worse on lateral side. Has history of meniscectomy here remotely. Pain is 8/10 and sharp. Worse with walking. Using a walker. Tried flexogenix without benefit. Last cortisone shot by outside physician was early last year. No skin changes, numbness.  3/20: Patient reports she feels improved compared to last visit. Doing well with physical therapy and home exercises. Pain level is 4/10. Injection helped a lot first 3 weeks. No swelling. No skin changes, numbness.  Past Medical History:  Diagnosis Date  . Anxiety   . Arthritis of both knees 10/01/2013  . Benign paroxysmal positional vertigo 10/01/2013  . Cancer [facility]) breast ca  right  . COPD (chronic obstructive pulmonary disease) ([place]) 10/01/2013  . Depression   . Diabetes mellitus type 2  . Emphysema   . Encephalopathy, hepatic ([place]) 06/07/2014  . Esophageal reflux 10/01/2013  . Fall 07/30/2016  . Hyperlipidemia   . Hyperlipidemia, mixed   . Increased ammonia level 11/25/2014  . NASH (nonalcoholic steatohepatitis) 08/26/2015  . Neck pain 10/01/2013  . Neuropathy (HCC)    feet   . Overactive bladder 12/10/2013  . Panic attacks   . Pedal edema 12/10/2013  . Preventative health care 03/08/2016  . Tobacco abuse disorder 02/01/2014    Current Outpatient Prescriptions on File Prior to Visit  Medication Sig Dispense Refill  . albuterol (ACCUNEB) 1.25 MG/3ML nebulizer solution Take 1 ampule by nebulization every 6 (six) hours as needed for wheezing.     Marland Kitchen albuterol (PROVENTIL HFA;VENTOLIN HFA) 108 (90 Base) MCG/ACT inhaler Inhale 2 puffs into the lungs every 6 (six) hours as needed for wheezing or shortness of breath. Only dispense Ventolin 1 Inhaler 3  . BD PEN NEEDLE NANO U/F 32G X 4 MM MISC USE AS  DIRECTED WITH LEVEMIR FLEXPEN 100 each 0  . Blood Glucose Monitoring Suppl (ONE TOUCH ULTRA 2) w/Device KIT Use as directed once daily to check blood sugar.  DX E11.9 1 each 0  . cetirizine (ZYRTEC) 10 MG tablet Take 10 mg by mouth daily as needed for allergies.     . Cholecalciferol (VITAMIN D3) 2000 units TABS Take 2,000 Units by mouth every morning.    . furosemide (LASIX) 20 MG tablet TAKE 1 TABLET BY MOUTH TWICE DAILY 60 tablet 0  . insulin aspart (NOVOLOG) 100 UNIT/ML injection 4 units SQ q lunch daily and prn Sliding scale:  BS <200 no units BS 201-250 use 2 units BS 251-300 use 4 units BS 301-350 use 6 units BS 351-400 use 8 units BS 401-450 use 10 units BS>451 call MD 10 mL 3  . insulin lispro (HUMALOG) 100 UNIT/ML injection Inject into the skin.    Marland Kitchen insulin lispro (HUMALOG) 100 UNIT/ML injection Inject into the skin.    . Insulin Syringe-Needle U-100 31G X 5/16" 1 ML MISC To use w/ Novolog 100 each 12  . lactulose (CHRONULAC) 10 GM/15ML solution TAKE 45 MLS BY MOUTH TWICE DAILY AS NEEDED FOR MILD CONSTIPATION. (Patient taking differently: Take 61ms in the morning and take 60 mls in the afternoon) 1892 mL 6  . LEVEMIR FLEXTOUCH 100 UNIT/ML Pen INJECT 14 UNITS UNDER THE SKIN EVERY DAY AT 10PM 15 mL 0  . LIDOCAINE EX Apply 1 application topically at bedtime  as needed (pain).    Marland Kitchen omeprazole (PRILOSEC) 20 MG capsule TAKE 1 CAPSULE(20 MG) BY MOUTH DAILY 90 capsule 0  . ONE TOUCH ULTRA TEST test strip USE TWICE DAILY TO CHECK BLOOD SUGAR AS DIRECTED 100 each 4  . Polyvinyl Alcohol (LUBRICANT DROPS OP) Apply 1 drop to eye daily as needed (dry eyes).    . rifaximin (XIFAXAN) 550 MG TABS tablet Take 1 tablet (550 mg total) by mouth 2 (two) times daily. 60 tablet 11  . sodium chloride (OCEAN) 0.65 % SOLN nasal spray Place 1 spray into both nostrils as needed for congestion. 30 mL 0  . SPIRIVA RESPIMAT 2.5 MCG/ACT AERS Inhale 2.5 mcg into the lungs daily as needed. (Patient taking  differently: Inhale 2 puffs into the lungs daily. ) 3 Inhaler 3  . spironolactone (ALDACTONE) 50 MG tablet TAKE 2 TABLETS(100 MG) BY MOUTH DAILY 60 tablet 0  . tiZANidine (ZANAFLEX) 4 MG tablet Take 1 tablet (4 mg total) by mouth every 8 (eight) hours as needed for muscle spasms. (Patient taking differently: Take 2 mg by mouth every 8 (eight) hours as needed for muscle spasms. ) 30 tablet 1  . traMADol (ULTRAM-ER) 100 MG 24 hr tablet Take 1 tablet (100 mg total) by mouth daily as needed for pain. 30 tablet 1   No current facility-administered medications on file prior to visit.     Past Surgical History:  Procedure Laterality Date  . APPENDECTOMY  2007  . BREAST SURGERY  2009 right  . CATARACT EXTRACTION     x 2  . ESOPHAGOGASTRODUODENOSCOPY (EGD) WITH PROPOFOL N/A 08/14/2016   Procedure: ESOPHAGOGASTRODUODENOSCOPY (EGD) WITH PROPOFOL;  Surgeon: Mauri Pole, MD;  Location: [facility];  Service: Endoscopy;  Laterality: N/A;  . [place]  . KNEE SURGERY    . MANDIBLE FRACTURE SURGERY    . PILONIDAL CYST EXCISION    . TONSILLECTOMY      Allergies  Allergen Reactions  . Citalopram     Irregular heart beat  . Erythromycin     Stomach cramps  . Glimepiride     Elevated ammonia levels  . Prednisone     Increased blood sugars too high  . Versed [Midazolam] Other (See Comments)    Patient stayed confusion stayed 4+days     Social History   Social History  . Marital status: Single    Spouse name: N/A  . Number of children: 0  . Years of education: N/A   Occupational History  . retired Retired   Social History Main Topics  . Smoking status: Current Some Day Smoker    Packs/day: 0.50    Years: 58.00    Types: Cigarettes    Start date: 07/27/1964  . Smokeless tobacco: Never Used     Comment: 1 pack per week, tobacco infor given 12/30/15  . Alcohol use No  . Drug use: No  . Sexual activity: No   Other Topics Concern  . Not on file   Social History  Narrative   Lives alone, continues to smoke, no dietary restrictions    Family History  Problem Relation Age of Onset  . Heart failure Father   . COPD Father   . Arthritis Father 13  . Stroke Mother   . Arthritis Mother 20  . Hyperlipidemia Mother   . Hypertension Mother   . Diabetes Mother   . Diabetes Sister   . Breast cancer    . Breast cancer Maternal Aunt   .  Asthma Maternal Aunt   . Birth defects Maternal Aunt   . Alcohol abuse Maternal Uncle     BP (!) 101/53   Pulse 71   Ht _0  (1.702 m)   Wt 150 lb (68 kg)   BMI 23.49 kg/m   Review of Systems: See HPI above.     Objective:  Physical Exam:  Gen: NAD, comfortable in exam room  Right knee: No gross deformity, ecchymoses, effusion. Minimal TTP lateral joint line.  No other tenderness. FROM. Negative ant/post drawers. Negative valgus/varus testing. Negative lachmanns. Negative mcmurrays, apleys, patellar apprehension. NV intact distally.  Left knee; FROM without pain.   Assessment & Plan:  1. 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  Improving following injection,  Physical therapy.  She will consider viscosupplementation if not doing well in the future.  Reviewed tylenol, topical medications.  Home exercises.   Heat/ice.  Continue use of walker.  F/u prn.

## 2016-10-15 NOTE — Therapy (Addendum)
[facility] [address]  [place], [place], [address] Phone: [phone]   Fax:  [phone]  Physical Therapy Treatment  Patient Details  Name: Ashley Savage MRN: [id number] Date of Birth: 1941/04/13 Referring Provider: Karlton Lemon, MD  Encounter Date: 10/15/2016      PT End of Session - 10/15/16 1405    Visit Number 14   Number of Visits 16   Date for PT Re-Evaluation 10/23/16   Authorization Type [employer] VL: Follow Medicare Guidelines   PT Start Time 1405   PT Stop Time 1448   PT Time Calculation (min) 43 min   Activity Tolerance Patient tolerated treatment well   Behavior During Therapy [facility] for tasks assessed/performed      Past Medical History:  Diagnosis Date  . Anxiety   . Arthritis of both knees 10/01/2013  . Benign paroxysmal positional vertigo 10/01/2013  . Cancer [facility]) breast ca  right  . COPD (chronic obstructive pulmonary disease) ([place]) 10/01/2013  . Depression   . Diabetes mellitus type 2  . Emphysema   . Encephalopathy, hepatic ([place]) 06/07/2014  . Esophageal reflux 10/01/2013  . Fall 07/30/2016  . Hyperlipidemia   . Hyperlipidemia, mixed   . Increased ammonia level 11/25/2014  . NASH (nonalcoholic steatohepatitis) 08/26/2015  . Neck pain 10/01/2013  . Neuropathy (HCC)    feet   . Overactive bladder 12/10/2013  . Panic attacks   . Pedal edema 12/10/2013  . Preventative health care 03/08/2016  . Tobacco abuse disorder 02/01/2014    Past Surgical History:  Procedure Laterality Date  . APPENDECTOMY  2007  . BREAST SURGERY  2009 right  . CATARACT EXTRACTION     x 2  . ESOPHAGOGASTRODUODENOSCOPY (EGD) WITH PROPOFOL N/A 08/14/2016   Procedure: ESOPHAGOGASTRODUODENOSCOPY (EGD) WITH PROPOFOL;  Surgeon: Mauri Pole, MD;  Location: [facility];  Service: Endoscopy;  Laterality: N/A;  . [place]  . KNEE SURGERY    . MANDIBLE FRACTURE SURGERY    . PILONIDAL CYST EXCISION     . TONSILLECTOMY      There were no vitals filed for this visit.      Subjective Assessment - 10/15/16 1411    Subjective Pt missed her PT appt Mon due to family emergency. Reports she saw the MD on Tues who recommended she take a break from PT and get back to her normal routine to see how she does.   Patient Stated Goals "want to know what is wrong and get it fixed"   Currently in Pain? Yes   Pain Score 0-No pain   Multiple Pain Sites Yes   Pain Score 6   Pain Location Knee   Pain Orientation Right   Pain Descriptors / Indicators --  "grating/grinding"   Pain Type Chronic pain   Aggravating Factors  turned her knee yesterday when she went to get her newspaper; chronic OA   Pain Relieving Factors ice            [facility] PT Assessment - 10/15/16 1405      Assessment   Medical Diagnosis Lower Back Injury   Referring Provider Karlton Lemon, MD   Onset Date/Surgical Date 07/19/16     Observation/Other Assessments   Focus on Therapeutic Outcomes (FOTO)  Lumbar Spine 57% (43% limitation)      Strength   Overall Strength Comments tested in sitting   Right Hip Flexion 4-/5   Right Hip Extension 4-/5  Right Hip External Rotation  3+/5  limited d/t pain at knee with resistance   Right Hip Internal Rotation 4/5   Right Hip ABduction 4/5   Right Hip ADduction 4/5   Left Hip Flexion 4-/5   Left Hip Extension 4-/5   Left Hip External Rotation 4/5   Left Hip Internal Rotation 4/5   Left Hip ABduction 4/5   Left Hip ADduction 4/5   Right Knee Flexion 4/5   Right Knee Extension 4-/5  pain with resistance   Left Knee Flexion 4/5   Left Knee Extension 4+/5     Ambulation/Gait   Gait velocity 1.93 ft/sec with RW; 1.51 ft/sec; 1.60 ft/sec w/o AD     Standardized Balance Assessment   10 Meter Walk 17.03" with RW; 21.72" with SPC; 20.44" w/o AD                     [facility] Adult PT Treatment/Exercise - 10/15/16 1405      Ambulation/Gait   Ambulation/Gait  Assistance 6: Modified independent (Device/Increase time)   Assistive device Straight cane   Gait Pattern Step-to pattern   Ambulation Surface Level;Indoor     Self-Care   Self-Care Other Self-Care Comments   Other Self-Care Comments  Reviewed HEP & OTAGO program and instructed pt in recommended frequencies for each (alt days at 3x/wk for each). Educated pt on TB color progression as yellow TB becomes too easy.     Knee/Hip Exercises: Aerobic   Nustep lvl 6 x 6'             Balance Exercises - 10/15/16 1405      OTAGO PROGRAM   Overall OTAGO Comments Pt denies questions or concerns with OTAGO program and feels that this has been helping her. States hardest activities are still tandem activities.             PT Short Term Goals - 09/21/16 1423      PT SHORT TERM GOAL #1   Title Pt will be independent with basic HEP by 09/25/16.   Status Achieved     PT SHORT TERM GOAL #2   Title Pt will be able to demonstrate safe gait pattern with rolling walker by 09/25/16.   Status Achieved           PT Long Term Goals - 10/15/16 1411      PT LONG TERM GOAL #1   Title pt will be independent with advanced HEP by 10/23/16.   Status Achieved     PT LONG TERM GOAL #2   Title Pt will have increased R hip & knee strength to >/= 4-/5 to improve function by 10/23/16.   Status Partially Met  Met except R hip ER 3+/5 d/t pain at knee     PT LONG TERM GOAL #3   Title pt will be able to ambulate community distances with cane (or least restricitive device) safely and independently by 10/23/16.    Status Achieved     PT LONG TERM GOAL #4   Title pt will report at least 50% reduction in R leg and back pain with weight-bearing activities by 10/23/16.   Status Achieved     PT LONG TERM GOAL #5   Title Pt will improve gait speed to at least 1.8 ft/sec to reduce fall risk by 10/23/16.   Status Partially Met  1.93 ft/sec with RW; 1.51 ft/sec; 1.60 ft/sec w/o AD - slower speed today d/t R knee  pain -  had previously been met with RW               Plan - 17-Oct-2016 1415    Clinical Impression Statement Pt missed last visit due to family emergency, but still feeling ready to transition to HEP. Pt has demonstrated good progress with PT, with overall resolution of groin/hip/LBP. Continues to have R knee pain but this has been chronic from severe OA and only as temporary relief from injections. Pt noting considerable improvement with PT with decreasing need for AD's and improving ability to perform normal daily tasks around the home. B LE strength improved to >/= 4-/5 with only exception of R hip ER at 3+/5 (limited by knee pain with resistance). Walking speed remains somewhat decreased but pt feels that this is due to the pain in her knee. All goals met or at least partially met and pt feels confident with HEP, therefore will place pt on 30 day hold in the event that further issues arise.   Rehab Potential Good   Clinical Impairments Affecting Rehab Potential COPD, DM, high fall risk, H/O BBPV, H/O R knee pain, Neuropathy in feet, inability to be mobile for 6 weeks   PT Treatment/Interventions Patient/family education;ADLs/Self Care Home Management;Therapeutic exercise;Therapeutic activities;Balance training;Gait training;Stair training;Functional mobility training;Manual techniques;Moist Heat;Cryotherapy;Electrical Stimulation;Iontophoresis 12m/ml Dexamethasone;Neuromuscular re-education;DME Instruction;Taping;Dry needling   PT Next Visit Plan 30 day hold   Consulted and Agree with Plan of Care Patient      Patient will benefit from skilled therapeutic intervention in order to improve the following deficits and impairments:  Pain, Increased muscle spasms, Decreased strength, Decreased mobility, Decreased balance, Decreased activity tolerance, Decreased endurance, Difficulty walking, Postural dysfunction, Cardiopulmonary status limiting activity, Abnormal gait, Decreased knowledge of use of  DME, Decreased safety awareness  Visit Diagnosis: Pain in right leg  Muscle weakness (generalized)  Difficulty in walking, not elsewhere classified  Unsteadiness on feet       G-Codes - 0Mar 24, 20181506    Functional Assessment Tool Used (Outpatient Only) Lumbar Spine FOTO = 57% (43% limitation)    Functional Limitation Mobility: Walking and moving around   Mobility: Walking and Moving Around Goal Status [phone] At least 40 percent but less than 60 percent impaired, limited or restricted   Mobility: Walking and Moving Around Discharge Status [phone] At least 40 percent but less than 60 percent impaired, limited or restricted      Problem List Patient Active Problem List   Diagnosis Date Noted  . 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 Portal hypertensive gastropathy   . Lower back injury, initial encounter 08/05/2016  . Fall 07/30/2016  . Preventative health care 03/08/2016  . Muscle spasm 02/25/2016  . Chronic respiratory failure ([place] 08/29/2015  . NASH (nonalcoholic steatohepatitis) 08/26/2015  . Type 2 diabetes mellitus with hyperglycemia, with long-term current use of insulin ([place]   . Liver cirrhosis secondary to NASH ([facility] 07/31/2015  . Diarrhea 12/30/2014  . Increased ammonia level 11/25/2014  . Diabetes mellitus type 2, controlled ([facility] [place] 02016/03/24 . Superficial bruising 10/04/2014  . Encephalopathy, hepatic ([facility] 06/07/2014  . Right knee pain 06/07/2014  . Peripheral edema 03/30/2014  . Sun-damaged skin 02/01/2014  . Anxiety and depression 02/01/2014  . Tobacco abuse disorder 02/01/2014  . Medicare annual wellness visit, subsequent 02/01/2014  . Pedal edema 12/10/2013  . Overactive bladder 12/10/2013  . Abdominal aortic aneurysm ([place] 10/01/2013  . Arthritis of both knees 10/01/2013  . Benign paroxysmal positional vertigo 10/01/2013  . Neck pain 10/01/2013  .  Esophageal reflux 10/01/2013  . Thrombocytopenia ([place]) 03/17/2012  . Obstructive chronic  bronchitis without exacerbation COPD gold stage C.   . Hyperlipidemia, mixed   . Breast cancer ([place])     Percival Spanish, PT, MPT 10/15/2016, 3:15 PM  [facility] [address]  [place], [place], [address] Phone: [phone]   Fax:  [phone]  Name: Ashley Savage MRN: [id number] Date of Birth: 06/12/1941    PHYSICAL THERAPY DISCHARGE SUMMARY  Visits from Start of Care: 14   Current functional level related to goals / functional outcomes:   Refer to above clinical impression. Pt has not needed to return in >30 days, therefore will proceed with discharge from PT.   Remaining deficits:   As above.   Education / Equipment:   HEP  Plan: Patient agrees to discharge.  Patient goals were partially met. Patient is being discharged due to being pleased with the current functional level.  ?????     Percival Spanish, PT, MPT 12/07/16, 9:04 AM  [facility] [address]  [place], [place], [address] Phone: [phone]   Fax:  [phone]

## 2016-10-15 NOTE — Assessment & Plan Note (Signed)
2/2 DJD.  Improving following injection,  Physical therapy.  She will consider viscosupplementation if not doing well in the future.  Reviewed tylenol, topical medications.  Home exercises.   Heat/ice.  Continue use of walker.  F/u prn.

## 2016-10-15 NOTE — Telephone Encounter (Signed)
Left pt message asking to call Allison back directly at [phone] to schedule AWV. Thanks! °

## 2016-10-16 ENCOUNTER — Encounter: Payer: Self-pay | Admitting: Family Medicine

## 2016-10-16 ENCOUNTER — Other Ambulatory Visit: Payer: Self-pay | Admitting: Family Medicine

## 2016-10-16 MED ORDER — ALBUTEROL SULFATE 1.25 MG/3ML IN NEBU: 1.0000 | INHALATION_SOLUTION | Freq: Four times a day (QID) | RESPIRATORY_TRACT | 1 refills | Status: DC | PRN

## 2016-10-19 ENCOUNTER — Other Ambulatory Visit: Payer: Self-pay | Admitting: Gastroenterology

## 2016-10-20 ENCOUNTER — Other Ambulatory Visit: Payer: Self-pay | Admitting: Family

## 2016-10-20 ENCOUNTER — Ambulatory Visit
Admission: RE | Admit: 2016-10-20 | Discharge: 2016-10-20 | Disposition: A | Discharge status: Home / Self Care | Payer: PPO | Source: Ambulatory Visit | Attending: Family | Admitting: Family

## 2016-10-20 DIAGNOSIS — R921 Mammographic calcification found on diagnostic imaging of breast: Secondary | ICD-10-CM | POA: Diagnosis not present

## 2016-10-20 DIAGNOSIS — C50019 Malignant neoplasm of nipple and areola, unspecified female breast: Secondary | ICD-10-CM

## 2016-10-20 HISTORY — DX: Personal history of irradiation: Z92.3

## 2016-10-21 ENCOUNTER — Other Ambulatory Visit: Payer: Self-pay | Admitting: Family

## 2016-10-21 ENCOUNTER — Ambulatory Visit
Admission: RE | Admit: 2016-10-21 | Discharge: 2016-10-21 | Disposition: A | Discharge status: Home / Self Care | Payer: PPO | Source: Ambulatory Visit | Attending: Family | Admitting: Family

## 2016-10-21 DIAGNOSIS — N6012 Diffuse cystic mastopathy of left breast: Secondary | ICD-10-CM | POA: Diagnosis not present

## 2016-10-21 DIAGNOSIS — R921 Mammographic calcification found on diagnostic imaging of breast: Secondary | ICD-10-CM

## 2016-10-22 ENCOUNTER — Encounter: Payer: Self-pay | Admitting: *Deleted

## 2016-10-22 ENCOUNTER — Ambulatory Visit (INDEPENDENT_AMBULATORY_CARE_PROVIDER_SITE_OTHER): Payer: PPO | Admitting: *Deleted

## 2016-10-22 VITALS — BP 128/56 | HR 78 | Ht 67.0 in | Wt 148.2 lb

## 2016-10-22 DIAGNOSIS — Z780 Asymptomatic menopausal state: Secondary | ICD-10-CM

## 2016-10-22 DIAGNOSIS — Z0000 Encounter for general adult medical examination without abnormal findings: Secondary | ICD-10-CM | POA: Diagnosis not present

## 2016-10-22 NOTE — Progress Notes (Signed)
Subjective:   Ashley Savage is a 76 y.o. female who presents for Medicare Annual (Subsequent) preventive examination.  She continues to struggle with R knee pain and has been following w/ Dr. Barbaraann Barthel for this.   She is checking her CBG at home and has had approximately 6 episodes of hypoglycemia in the 60s-70s in the past month. These normally occur first thing in the morning and she will feel weak when she wakes up. She will then eat some cereal and drink OJ and sugar returns to normal. She is due for follow-up w/ PCP.   Review of Systems:  No ROS.  Medicare Wellness Visit.  Cardiac Risk Factors include: advanced age (>33mn, >>85women);diabetes mellitus;dyslipidemia;hypertension  Sleep patterns: no sleep issues   Home Safety/Smoke Alarms: Feels safe in home. Smoke alarms in place.She knows her neighbors well and they sometimes help her with chores like taking trash out, bringing groceries/bags of cat litter in, etc. Living environment; residence and FAdult nurse Lives alone in apartment, number of outside stairs: 0, equipment: CRadio producer Type: Narrow BConocoPhillipsand HOmnicom Type: Tub SSurveyor, quantity Seat Belt Safety/Bike Helmet: Wears seat belt.   Counseling:   Eye Exam- [place] name of provider.   Dental- Does not follow w/ dentist regularly. Dentures upper and lower. Brushes gums regularly.  Female:   Pap- Aged out       Mammo- s/p R breast mastectomy. L breast diagnostic MMG completed 10/20/16-BI-RADS CATEGORY 4: Suspicious. Biopsy and clip placement completed yesterday.        Dexa scan- last 06/10/12. Osteopenia.        CCS- last 05/02/09. > 10 polyps removed. 5 year recall. Follows w/ GI.     Objective:     Vitals: BP (!) 128/56   Pulse 78   Ht '5\' 7"'  (1.702 m)   Wt 148 lb 3.2 oz (67.2 kg)   SpO2 95%   BMI 23.21 kg/m   Body mass index is 23.21 kg/m.  Wt Readings from Last 3 Encounters:  10/22/16 148 lb 3.2 oz  (67.2 kg)  10/13/16 150 lb (68 kg)  09/28/16 148 lb (67.1 kg)   Tobacco History  Smoking Status  . Current Some Day Smoker  . Packs/day: 0.25  . Years: 58.00  . Types: Cigarettes  . Start date: 07/27/1964  Smokeless Tobacco  . Never Used    Comment: 1 pack per week, tobacco infor given 12/30/15     Ready to quit: Not Answered Counseling given: Not Answered   Past Medical History:  Diagnosis Date  . Anxiety   . Arthritis of both knees 10/01/2013  . Benign paroxysmal positional vertigo 10/01/2013  . Cancer ([facility] breast ca  right  . COPD (chronic obstructive pulmonary disease) ([place] 10/01/2013  . Depression   . Diabetes mellitus type 2  . Emphysema   . Encephalopathy, hepatic ([place] 06/07/2014  . Esophageal reflux 10/01/2013  . Fall 07/30/2016  . Hyperlipidemia   . Hyperlipidemia, mixed   . Increased ammonia level 11/25/2014  . NASH (nonalcoholic steatohepatitis) 08/26/2015  . Neck pain 10/01/2013  . Neuropathy (HCC)    feet   . Overactive bladder 12/10/2013  . Panic attacks   . Pedal edema 12/10/2013  . Personal history of radiation therapy   . Preventative health care 03/08/2016  . Tobacco abuse disorder 02/01/2014   Past Surgical History:  Procedure Laterality Date  . APPENDECTOMY  2007  . BREAST SURGERY  2009 right  . CATARACT EXTRACTION     x 2  . ESOPHAGOGASTRODUODENOSCOPY (EGD) WITH PROPOFOL N/A 08/14/2016   Procedure: ESOPHAGOGASTRODUODENOSCOPY (EGD) WITH PROPOFOL;  Surgeon: Mauri Pole, MD;  Location: [facility];  Service: Endoscopy;  Laterality: N/A;  . [place]  . KNEE SURGERY    . MANDIBLE FRACTURE SURGERY    . MASTECTOMY    . PILONIDAL CYST EXCISION    . TONSILLECTOMY     Family History  Problem Relation Age of Onset  . Heart failure Father   . COPD Father   . Arthritis Father 8  . Stroke Mother   . Arthritis Mother 35  . Hyperlipidemia Mother   . Hypertension Mother   . Diabetes Mother   . Diabetes Sister   . Breast cancer    .  Breast cancer Maternal Aunt   . Asthma Maternal Aunt   . Birth defects Maternal Aunt   . Alcohol abuse Maternal Uncle   . Breast cancer Maternal Aunt    History  Sexual Activity  . Sexual activity: No    Outpatient Encounter Prescriptions as of 10/22/2016  Medication Sig  . albuterol (ACCUNEB) 1.25 MG/3ML nebulizer solution Take 3 mLs (1.25 mg total) by nebulization every 6 (six) hours as needed for wheezing.  Marland Kitchen albuterol (PROVENTIL HFA;VENTOLIN HFA) 108 (90 Base) MCG/ACT inhaler Inhale 2 puffs into the lungs every 6 (six) hours as needed for wheezing or shortness of breath. Only dispense Ventolin  . BD PEN NEEDLE NANO U/F 32G X 4 MM MISC USE AS DIRECTED WITH LEVEMIR FLEXPEN  . Blood Glucose Monitoring Suppl (ONE TOUCH ULTRA 2) w/Device KIT Use as directed once daily to check blood sugar.  DX E11.9  . cetirizine (ZYRTEC) 10 MG tablet Take 10 mg by mouth daily as needed for allergies.   . Cholecalciferol (VITAMIN D3) 2000 units TABS Take 2,000 Units by mouth every morning.  . furosemide (LASIX) 20 MG tablet TAKE 1 TABLET BY MOUTH TWICE DAILY (Patient taking differently: TAKE 1 TABLET BY MOUTH DAILY)  . insulin aspart (NOVOLOG) 100 UNIT/ML injection 4 units SQ q lunch daily and prn Sliding scale:  BS <200 no units BS 201-250 use 2 units BS 251-300 use 4 units BS 301-350 use 6 units BS 351-400 use 8 units BS 401-450 use 10 units BS>451 call MD  . Insulin Syringe-Needle U-100 31G X 5/16" 1 ML MISC To use w/ Novolog  . lactulose (CHRONULAC) 10 GM/15ML solution TAKE 45 MLS BY MOUTH TWICE DAILY AS NEEDED FOR MILD CONSTIPATION. (Patient taking differently: TAKE 45 MLS BY MOUTH DAILY AS NEEDED FOR MILD CONSTIPATION.)  . LEVEMIR FLEXTOUCH 100 UNIT/ML Pen INJECT 14 UNITS UNDER THE SKIN EVERY DAY AT 10PM  . omeprazole (PRILOSEC) 20 MG capsule TAKE 1 CAPSULE(20 MG) BY MOUTH DAILY (Patient taking differently: TAKE 1 CAPSULE(20 MG) BY MOUTH EVERY OTHER DAY)  . ONE TOUCH ULTRA TEST test strip USE  TWICE DAILY TO CHECK BLOOD SUGAR AS DIRECTED  . Polyvinyl Alcohol (LUBRICANT DROPS OP) Apply 1 drop to eye daily as needed (dry eyes).  . rifaximin (XIFAXAN) 550 MG TABS tablet Take 1 tablet (550 mg total) by mouth 2 (two) times daily.  . sodium chloride (OCEAN) 0.65 % SOLN nasal spray Place 1 spray into both nostrils as needed for congestion.  Marland Kitchen SPIRIVA RESPIMAT 2.5 MCG/ACT AERS Inhale 2.5 mcg into the lungs daily as needed. (Patient taking differently: Inhale 2 puffs into the lungs daily. )  . spironolactone (  ALDACTONE) 50 MG tablet TAKE 2 TABLETS(100 MG) BY MOUTH DAILY  . tiZANidine (ZANAFLEX) 4 MG tablet Take 1 tablet (4 mg total) by mouth every 8 (eight) hours as needed for muscle spasms. (Patient taking differently: Take 2 mg by mouth at bedtime. )  . traMADol (ULTRAM-ER) 100 MG 24 hr tablet Take 1 tablet (100 mg total) by mouth daily as needed for pain. (Patient not taking: Reported on 10/22/2016)  . [DISCONTINUED] insulin lispro (HUMALOG) 100 UNIT/ML injection Inject into the skin.  . [DISCONTINUED] insulin lispro (HUMALOG) 100 UNIT/ML injection Inject into the skin.  . [DISCONTINUED] LIDOCAINE EX Apply 1 application topically at bedtime as needed (pain).   No facility-administered encounter medications on file as of 10/22/2016.     Activities of Daily Living In your present state of health, do you have any difficulty performing the following activities: 10/22/2016 02/25/2016  Hearing? N Y  Vision? N N  Difficulty concentrating or making decisions? N N  Walking or climbing stairs? N Y  Dressing or bathing? N N  Doing errands, shopping? N N  Preparing Food and eating ? N -  Using the Toilet? N -  In the past six months, have you accidently leaked urine? Y -  Do you have problems with loss of bowel control? N -  Managing your Medications? N -  Managing your Finances? N -  Housekeeping or managing your Housekeeping? N -  Some recent data might be hidden    Patient Care Team: Mosie Lukes, MD as PCP - General (Family Medicine) Dene Gentry, MD as Consulting Physician (Sports Medicine) Mauri Pole, MD as Consulting Physician (Gastroenterology) Rigoberto Noel, MD as Consulting Physician (Pulmonary Disease) Melvenia Needles, NP as Nurse Practitioner (Pulmonary Disease) Volanda Napoleon, MD as Consulting Physician (Oncology)    Assessment:    Physical assessment deferred to PCP.  Exercise Activities and Dietary recommendations Current Exercise Habits: Home exercise routine, Type of exercise: walking, Frequency (Times/Week): 7, Exercise limited by: respiratory conditions(s) (COPD)  Diet (meal preparation, eat out, water intake, caffeinated beverages, dairy products, fruits and vegetables): Tries to avoid concentrated/added sugars. Likes cereal-Cheerios, [employer], [employer]. Eats "a lot" of fruit-oranges, apples, bananas, and berries. Eats a PB sandwich everyday. On Fridays, has a "big splurge day" where she meets with friends at [facility] and eats a bacon and egg biscuit and coffee w/ sugar. She does not drink any coffee the rest of the week. Drinks unsweetened ice tea. Drinks water throughout the day. Combination of eating at home and eating out. Tries to eat a wide variety of foods and generally eats 3 meals daily.  Goals    . HEMOGLOBIN A1C < 7.0    . Increase physical activity          Increase activity slow and stop whenever you experience shortness of breath and/or pain       Fall Risk Fall Risk  10/22/2016 05/04/2016 02/25/2016 09/25/2015 02/08/2015  Falls in the past year? Yes Yes Yes No -  Number falls in past yr: 1 2 or more 1 - -  Injury with Fall? Yes No No - -  Risk for fall due to : Impaired balance/gait;History of fall(s) Impaired balance/gait - - Impaired balance/gait  Risk for fall due to (comments): - Has an arthritic right knee - - -  Follow up Education provided;Falls prevention discussed Falls prevention discussed - - -   Depression  Screen PHQ 2/9 Scores 10/22/2016 05/04/2016 02/25/2016 02/08/2015  PHQ - 2 Score 1 0 0 1     Cognitive Function MMSE - Mini Mental State Exam 10/22/2016 08/15/2014  Orientation to time 4 5  Orientation to Place 5 5  Registration 3 3  Attention/ Calculation 5 5  Recall 3 2  Language- name 2 objects 2 2  Language- repeat 1 1  Language- follow 3 step command 3 3  Language- read & follow direction 1 1  Write a sentence 1 1  Copy design 1 1  Total score 29 29        Immunization History  Administered Date(s) Administered  . Influenza Split 04/09/2011, 03/27/2012, 03/27/2013  . Influenza,inj,Quad PF,36+ Mos 03/26/2015  . Influenza-Unspecified 04/24/2014, 04/23/2016  . Pneumococcal Conjugate-13 02/01/2014  . Pneumococcal Polysaccharide-23 04/09/2011  . Td 07/27/2010  . Tdap 05/08/2014   Screening Tests Health Maintenance  Topic Date Due  . HEMOGLOBIN A1C  08/27/2016  . FOOT EXAM  11/24/2016  . URINE MICROALBUMIN  02/24/2017  . OPHTHALMOLOGY EXAM  05/06/2017  . MAMMOGRAM  01/24/2018  . COLONOSCOPY  07/27/2020  . TETANUS/TDAP  05/08/2024  . INFLUENZA VACCINE  Completed  . DEXA SCAN  Completed  . PNA vac Low Risk Adult  Completed      Plan:    Follow-up w/ PCP as scheduled. Follow-up sooner if continued hypoglycemia.  Bring a copy of your advance directives to your next office visit.  Continue to eat heart healthy diet (full of fruits, vegetables, whole grains, lean protein, water--limit salt, fat, and sugar intake) and increase physical activity as tolerated.  Orders placed for DEXA.  During the course of the visit the patient was educated and counseled about the following appropriate screening and preventive services:   Vaccines to include Pneumococcal, Influenza, Td, HCV  Cardiovascular Disease  Colorectal cancer screening  Bone density screening  Diabetes screening  Glaucoma screening  Mammography  Nutrition counseling   Patient Instructions (the written  plan) was given to the patient.   Dorrene German, RN  10/22/2016

## 2016-10-22 NOTE — Patient Instructions (Addendum)
  Ashley Savage , Thank you for taking time to come for your Medicare Wellness Visit. I appreciate your ongoing commitment to your health goals. Please review the following plan we discussed and let me know if I can assist you in the future.   Bring a copy of your advance directives to your next office visit.  These are the goals we discussed: Goals    . HEMOGLOBIN A1C < 7.0    . Increase physical activity          Increase activity slow and stop whenever you experience shortness of breath and/or pain        This is a list of the screening recommended for you and due dates:  Health Maintenance  Topic Date Due  . Hemoglobin A1C  08/27/2016  . Complete foot exam   11/24/2016  . Urine Protein Check  02/24/2017  . Eye exam for diabetics  05/06/2017  . Mammogram  01/24/2018  . Colon Cancer Screening  07/27/2020  . Tetanus Vaccine  05/08/2024  . Flu Shot  Completed  . DEXA scan (bone density measurement)  Completed  . Pneumonia vaccines  Completed

## 2016-10-22 NOTE — Progress Notes (Signed)
Pre visit review using our clinic review tool, if applicable. No additional management support is needed unless otherwise documented below in the visit note. 

## 2016-10-26 ENCOUNTER — Telehealth: Payer: Self-pay | Admitting: *Deleted

## 2016-10-26 ENCOUNTER — Encounter: Payer: Self-pay | Admitting: *Deleted

## 2016-10-26 DIAGNOSIS — G8929 Other chronic pain: Secondary | ICD-10-CM

## 2016-10-26 DIAGNOSIS — M25561 Pain in right knee: Secondary | ICD-10-CM

## 2016-10-26 DIAGNOSIS — I779 Disorder of arteries and arterioles, unspecified: Secondary | ICD-10-CM

## 2016-10-26 DIAGNOSIS — I739 Peripheral vascular disease, unspecified: Principal | ICD-10-CM

## 2016-10-26 NOTE — Telephone Encounter (Signed)
-----   Message from Mosie Lukes, MD sent at 10/22/2016  5:05 PM EDT ----- Thanks, order the carotid dopplers for carotid artery disease. Also can place ortho referral for right knee pain. For the sugar. Confirm what insulin and how much is she currently taking, Levemir 14 units? Cut that in 1/2 daily if she is using it and make sure she eats small frequent meals with lots of lean proteins. This helps hold the blood sugars. As long as her sugars improve she should be Ok to wait for me if she does not she should see someone. Dr B ----- Message ----- From: Dorrene German, RN Sent: 10/22/2016   3:14 PM To: Mosie Lukes, MD  Dr. Charlett Blake,   I saw this patient for AWV today.   She had carotid studies done in 2015. 1 year follow-up was recommended. Please advise if you would like me to arrange repeat imaging, and dx if so. These were originally done by cards, but she has not seen them since 2016.   She is following w/ Dr. Barbaraann Barthel, but is interested in ortho referral for chronic R knee pain/arthritis. Please advise.  Lastly, she has had about 6 episodes of hypoglycemia in the last month. They have responded to PO intake. See my note for further info. I scheduled her for 30 min office visit with you on 11/10/16, which is the soonest available. Please advise if you want me to try to get her in sooner with another provider.   Thanks, Hoyle Sauer

## 2016-10-26 NOTE — Telephone Encounter (Signed)
Patient has been taking 14 units of levemir as directed. MyChart message sent to patient with new instructions as below. Ortho referral placed. Patient had a previous order that is still active for carotid doppler. I discussed w/ referral coordinator, and our imaging scheduler will reach out to the patient to schedule.

## 2016-11-10 ENCOUNTER — Encounter: Payer: Self-pay | Admitting: Family Medicine

## 2016-11-10 ENCOUNTER — Ambulatory Visit (INDEPENDENT_AMBULATORY_CARE_PROVIDER_SITE_OTHER): Payer: PPO | Admitting: Family Medicine

## 2016-11-10 VITALS — BP 108/43 | HR 78 | Temp 97.7°F | Wt 148.0 lb

## 2016-11-10 DIAGNOSIS — Z720 Tobacco use: Secondary | ICD-10-CM | POA: Diagnosis not present

## 2016-11-10 DIAGNOSIS — K7581 Nonalcoholic steatohepatitis (NASH): Secondary | ICD-10-CM | POA: Diagnosis not present

## 2016-11-10 DIAGNOSIS — K7290 Hepatic failure, unspecified without coma: Secondary | ICD-10-CM

## 2016-11-10 DIAGNOSIS — T7840XD Allergy, unspecified, subsequent encounter: Secondary | ICD-10-CM

## 2016-11-10 DIAGNOSIS — K7682 Hepatic encephalopathy: Secondary | ICD-10-CM

## 2016-11-10 DIAGNOSIS — G8929 Other chronic pain: Secondary | ICD-10-CM

## 2016-11-10 DIAGNOSIS — T7840XA Allergy, unspecified, initial encounter: Secondary | ICD-10-CM | POA: Insufficient documentation

## 2016-11-10 DIAGNOSIS — J449 Chronic obstructive pulmonary disease, unspecified: Secondary | ICD-10-CM

## 2016-11-10 DIAGNOSIS — M25561 Pain in right knee: Secondary | ICD-10-CM | POA: Diagnosis not present

## 2016-11-10 DIAGNOSIS — Z794 Long term (current) use of insulin: Secondary | ICD-10-CM

## 2016-11-10 DIAGNOSIS — D696 Thrombocytopenia, unspecified: Secondary | ICD-10-CM | POA: Diagnosis not present

## 2016-11-10 DIAGNOSIS — E1165 Type 2 diabetes mellitus with hyperglycemia: Secondary | ICD-10-CM | POA: Diagnosis not present

## 2016-11-10 DIAGNOSIS — E782 Mixed hyperlipidemia: Secondary | ICD-10-CM | POA: Diagnosis not present

## 2016-11-10 HISTORY — DX: Allergy, unspecified, initial encounter: T78.40XA

## 2016-11-10 LAB — AMMONIA: AMMONIA: 115 umol/L — AB (ref 11–35)

## 2016-11-10 MED ORDER — MONTELUKAST SODIUM 10 MG PO TABS: 10.0000 mg | ORAL_TABLET | Freq: Every day | ORAL | 2 refills | Status: DC | PRN

## 2016-11-10 MED ORDER — FLUTICASONE PROPIONATE 50 MCG/ACT NA SUSP: 2.0000 | Freq: Every day | NASAL | 6 refills | Status: DC

## 2016-11-10 NOTE — Assessment & Plan Note (Signed)
asymptomatic

## 2016-11-10 NOTE — Assessment & Plan Note (Signed)
Spring usually flares her copd and she uses the nebulizer treatments and Zyrtec. Add flonase to her daily regiment and see if that helps. Will add some singulair as well due to confluence of symptoms

## 2016-11-10 NOTE — Assessment & Plan Note (Signed)
hgba1c acceptable, minimize simple carbs. Increase exercise as tolerated. Continue current meds 

## 2016-11-10 NOTE — Assessment & Plan Note (Signed)
Follows with pulmonology, Dr Elsworth Soho and is doing well on Spiriva. No changes

## 2016-11-10 NOTE — Assessment & Plan Note (Signed)
Encouraged complete cessation. Discussed need to quit as relates to risk of numerous cancers, cardiac and pulmonary disease as well as neurologic complications. Counseled for greater than 3 minutes 

## 2016-11-10 NOTE — Assessment & Plan Note (Addendum)
Very painful, patient not a surgical candidate. The brace is not helpful she got from [facility]. Had a series of shots from [facility] and [facility] in past with minimal improvement but will refer back to sports med for further treatment

## 2016-11-10 NOTE — Assessment & Plan Note (Signed)
Has done better since Xifaxin, was started, she gets assistance from company due to cost

## 2016-11-10 NOTE — Progress Notes (Signed)
[name], female   DOB: 1940-09-08, 76 y.o.   MRN: [id number]   Subjective:  I acted as a Education administrator for Ashley Savage, Ashley Savage, [place]   [name], female    DOB: 04-18-1941, 76 y.o.   MRN: [id number]  Chief Complaint  Patient presents with  . Diabetes  . COPD  . NASH    Diabetes  She presents for her follow-up diabetic visit. She has type 2 diabetes mellitus. Pertinent negatives for hypoglycemia include no headaches. Pertinent negatives for diabetes include no blurred vision and no chest pain.    Patient is in today for an Office Visit to discuss Diabetes, COPD, Ashley Savage. Patient has a Hx of Type II Diabetes, COPD, NASH, vertigo, pedal edema. Patient has no acute concerns noted at this time. She notes her allergies flare in spring and she coughs more has more COPD flares and sob. No fevers and chills. Denies CP/palp/HA/fevers/GI or GU c/o. Taking meds as prescribed. She denies any recent hospitalizatins or acute mental status concerns. Denies CP/palp/HA/congestion/fevers/GI or GU c/o. Taking meds as prescribed. C/o ongoing right knee pain but no acute injury or swelling.  Patient Care Team: Mosie Lukes, MD as PCP - General (Family Medicine) Dene Gentry, MD as Consulting Physician (Sports Medicine) Mauri Pole, MD as Consulting Physician (Gastroenterology) Rigoberto Noel, MD as Consulting Physician (Pulmonary Disease) Melvenia Needles, NP as Nurse Practitioner (Pulmonary Disease) Volanda Napoleon, MD as Consulting Physician (Oncology)   Past Medical History:  Diagnosis Date  . Allergic state 11/10/2016  . Anxiety   . Arthritis of both knees 10/01/2013  . Benign paroxysmal positional vertigo 10/01/2013  . Cancer [facility]) breast ca  right  . COPD (chronic obstructive pulmonary disease) ([place]) 10/01/2013  . Depression   . Diabetes mellitus type 2  . Emphysema   . Encephalopathy, hepatic ([place]) 06/07/2014  . Esophageal reflux 10/01/2013  . Fall 07/30/2016  .  Hyperlipidemia   . Hyperlipidemia, mixed   . Increased ammonia level 11/25/2014  . NASH (nonalcoholic steatohepatitis) 08/26/2015  . Neck pain 10/01/2013  . Neuropathy    feet   . Overactive bladder 12/10/2013  . Panic attacks   . Pedal edema 12/10/2013  . Personal history of radiation therapy   . Preventative health care 03/08/2016  . Tobacco abuse disorder 02/01/2014    Past Surgical History:  Procedure Laterality Date  . APPENDECTOMY  2007  . BREAST SURGERY  2009 right  . CATARACT EXTRACTION     x 2  . ESOPHAGOGASTRODUODENOSCOPY (EGD) WITH PROPOFOL N/A 08/14/2016   Procedure: ESOPHAGOGASTRODUODENOSCOPY (EGD) WITH PROPOFOL;  Surgeon: Mauri Pole, MD;  Location: [facility];  Service: Endoscopy;  Laterality: N/A;  . [place]  . KNEE SURGERY    . MANDIBLE FRACTURE SURGERY    . MASTECTOMY    . PILONIDAL CYST EXCISION    . TONSILLECTOMY      Family History  Problem Relation Age of Onset  . Heart failure Father   . COPD Father   . Arthritis Father 104  . Stroke Mother   . Arthritis Mother 75  . Hyperlipidemia Mother   . Hypertension Mother   . Diabetes Mother   . Diabetes Sister   . Breast cancer    . Breast cancer Maternal Aunt   . Asthma Maternal Aunt   . Birth defects Maternal Aunt   . Alcohol abuse Maternal Uncle   . Breast cancer Maternal Aunt  Social History   Social History  . Marital status: Single    Spouse name: N/A  . Number of children: 0  . Years of education: N/A   Occupational History  . retired Retired   Social History Main Topics  . Smoking status: Current Some Day Smoker    Packs/day: 0.25    Years: 58.00    Types: Cigarettes    Start date: 07/27/1964  . Smokeless tobacco: Never Used     Comment: 1 pack per week, tobacco infor given 12/30/15  . Alcohol use No  . Drug use: No  . Sexual activity: No   Other Topics Concern  . Not on file   Social History Narrative   Lives alone, continues to smoke, no dietary  restrictions    Outpatient Medications Prior to Visit  Medication Sig Dispense Refill  . albuterol (ACCUNEB) 1.25 MG/3ML nebulizer solution Take 3 mLs (1.25 mg total) by nebulization every 6 (six) hours as needed for wheezing. 75 mL 1  . albuterol (PROVENTIL HFA;VENTOLIN HFA) 108 (90 Base) MCG/ACT inhaler Inhale 2 puffs into the lungs every 6 (six) hours as needed for wheezing or shortness of breath. Only dispense Ventolin 1 Inhaler 3  . BD PEN NEEDLE NANO U/F 32G X 4 MM MISC USE AS DIRECTED WITH LEVEMIR FLEXPEN 100 each 0  . Blood Glucose Monitoring Suppl (ONE TOUCH ULTRA 2) w/Device KIT Use as directed once daily to check blood sugar.  DX E11.9 1 each 0  . cetirizine (ZYRTEC) 10 MG tablet Take 10 mg by mouth daily as needed for allergies.     . Cholecalciferol (VITAMIN D3) 2000 units TABS Take 2,000 Units by mouth every morning.    . furosemide (LASIX) 20 MG tablet TAKE 1 TABLET BY MOUTH TWICE DAILY (Patient taking differently: TAKE 1 TABLET BY MOUTH DAILY) 60 tablet 0  . insulin aspart (NOVOLOG) 100 UNIT/ML injection 4 units SQ q lunch daily and prn Sliding scale:  BS <200 no units BS 201-250 use 2 units BS 251-300 use 4 units BS 301-350 use 6 units BS 351-400 use 8 units BS 401-450 use 10 units BS>451 call MD 10 mL 3  . Insulin Syringe-Needle U-100 31G X 5/16" 1 ML MISC To use w/ Novolog 100 each 12  . lactulose (CHRONULAC) 10 GM/15ML solution TAKE 45 MLS BY MOUTH TWICE DAILY AS NEEDED FOR MILD CONSTIPATION. (Patient taking differently: TAKE 45 MLS BY MOUTH DAILY AS NEEDED FOR MILD CONSTIPATION.) 1892 mL 6  . LEVEMIR FLEXTOUCH 100 UNIT/ML Pen INJECT 14 UNITS UNDER THE SKIN EVERY DAY AT 10PM 15 mL 0  . omeprazole (PRILOSEC) 20 MG capsule TAKE 1 CAPSULE(20 MG) BY MOUTH DAILY (Patient taking differently: TAKE 1 CAPSULE(20 MG) BY MOUTH EVERY OTHER DAY) 90 capsule 0  . ONE TOUCH ULTRA TEST test strip USE TWICE DAILY TO CHECK BLOOD SUGAR AS DIRECTED 100 each 4  . Polyvinyl Alcohol (LUBRICANT  DROPS OP) Apply 1 drop to eye daily as needed (dry eyes).    . rifaximin (XIFAXAN) 550 MG TABS tablet Take 1 tablet (550 mg total) by mouth 2 (two) times daily. 60 tablet 11  . sodium chloride (OCEAN) 0.65 % SOLN nasal spray Place 1 spray into both nostrils as needed for congestion. 30 mL 0  . SPIRIVA RESPIMAT 2.5 MCG/ACT AERS Inhale 2.5 mcg into the lungs daily as needed. (Patient taking differently: Inhale 2 puffs into the lungs daily. ) 3 Inhaler 3  . spironolactone (ALDACTONE) 50 MG tablet TAKE 2  TABLETS(100 MG) BY MOUTH DAILY 60 tablet 0  . tiZANidine (ZANAFLEX) 4 MG tablet Take 1 tablet (4 mg total) by mouth every 8 (eight) hours as needed for muscle spasms. (Patient taking differently: Take 2 mg by mouth at bedtime. ) 30 tablet 1  . traMADol (ULTRAM-ER) 100 MG 24 hr tablet Take 1 tablet (100 mg total) by mouth daily as needed for pain. 30 tablet 1   No facility-administered medications prior to visit.     Allergies  Allergen Reactions  . Citalopram Palpitations    Irregular heart beat  . Erythromycin Other (See Comments)    Stomach cramps  . Glimepiride     Elevated ammonia levels  . Prednisone     Increased blood sugars too high  . Versed [Midazolam] Other (See Comments)    Patient stayed confusion stayed 4+days     Review of Systems  Constitutional: Positive for malaise/fatigue. Negative for fever.  HENT: Negative for congestion.   Eyes: Negative for blurred vision.  Respiratory: Positive for cough and shortness of breath.   Cardiovascular: Negative for chest pain, palpitations and leg swelling.  Gastrointestinal: Negative for vomiting.  Musculoskeletal: Positive for joint pain. Negative for back pain.  Skin: Negative for rash.  Neurological: Negative for loss of consciousness and headaches.       Objective:    Physical Exam  Constitutional: She is oriented to person, place, and time. She appears well-developed and well-nourished. No distress.  HENT:  Head:  Normocephalic and atraumatic.  Eyes: Conjunctivae are normal.  Neck: Normal range of motion. No thyromegaly present.  Cardiovascular: Normal rate and regular rhythm.   Pulmonary/Chest: Effort normal. She has wheezes.  rll exp wheezing  Abdominal: Soft. Bowel sounds are normal. There is no tenderness.  Musculoskeletal: She exhibits no edema or deformity.  Neurological: She is alert and oriented to person, place, and time.  Skin: Skin is warm and dry. She is not diaphoretic.  Psychiatric: She has a normal mood and affect.    BP (!) 108/43 (BP Location: Left Arm, Patient Position: Sitting, Cuff Size: Normal)   Pulse 78   Temp 97.7 F (36.5 C) (Oral)   Wt 148 lb (67.1 kg)   SpO2 97% Comment: RA  BMI 23.18 kg/m  Wt Readings from Last 3 Encounters:  11/10/16 148 lb (67.1 kg)  10/22/16 148 lb 3.2 oz (67.2 kg)  10/13/16 150 lb (68 kg)   BP Readings from Last 3 Encounters:  11/10/16 (!) 108/43  10/22/16 (!) 128/56  10/13/16 (!) 101/53     Immunization History  Administered Date(s) Administered  . Influenza Split 04/09/2011, 03/27/2012, 03/27/2013  . Influenza,inj,Quad PF,36+ Mos 03/26/2015  . Influenza-Unspecified 04/24/2014, 04/23/2016  . Pneumococcal Conjugate-13 02/01/2014  . Pneumococcal Polysaccharide-23 04/09/2011  . Td 07/27/2010  . Tdap 05/08/2014    Health Maintenance  Topic Date Due  . HEMOGLOBIN A1C  08/27/2016  . FOOT EXAM  11/24/2016  . INFLUENZA VACCINE  02/24/2017  . URINE MICROALBUMIN  02/24/2017  . OPHTHALMOLOGY EXAM  05/06/2017  . MAMMOGRAM  01/24/2018  . COLONOSCOPY  07/27/2020  . TETANUS/TDAP  05/08/2024  . DEXA SCAN  Completed  . PNA vac Low Risk Adult  Completed    Lab Results  Component Value Date   WBC 9.3 09/28/2016   HGB 14.9 09/28/2016   HCT 42.9 09/28/2016   PLT 120 (L) 09/28/2016   GLUCOSE 90 09/28/2016   CHOL 187 02/25/2016   TRIG 112.0 02/25/2016   HDL 55.80 02/25/2016  LDLCALC 108 (H) 02/25/2016   ALT 26 09/28/2016   AST 37  09/28/2016   NA 135 09/28/2016   K 5.1 09/28/2016   CL 91 (L) 09/28/2016   CREATININE 0.82 09/28/2016   BUN 17 09/28/2016   CO2 28 09/28/2016   TSH 1.39 02/25/2016   INR 1.2 (H) 12/11/2015   HGBA1C 6.3 02/25/2016   MICROALBUR <0.7 02/25/2016    Lab Results  Component Value Date   TSH 1.39 02/25/2016   Lab Results  Component Value Date   WBC 9.3 09/28/2016   HGB 14.9 09/28/2016   HCT 42.9 09/28/2016   MCV 96 09/28/2016   PLT 120 (L) 09/28/2016   Lab Results  Component Value Date   NA 135 09/28/2016   K 5.1 09/28/2016   CHLORIDE 104 06/29/2016   CO2 28 09/28/2016   GLUCOSE 90 09/28/2016   BUN 17 09/28/2016   CREATININE 0.82 09/28/2016   BILITOT 2.3 (H) 09/28/2016   ALKPHOS 201 (H) 09/28/2016   AST 37 09/28/2016   ALT 26 09/28/2016   PROT 5.4 (L) 09/28/2016   ALBUMIN 2.9 (L) 09/28/2016   CALCIUM 9.9 09/28/2016   ANIONGAP 8 09/06/2016   EGFR 50 (L) 06/29/2016   GFR 51.70 (L) 08/07/2016   Lab Results  Component Value Date   CHOL 187 02/25/2016   Lab Results  Component Value Date   HDL 55.80 02/25/2016   Lab Results  Component Value Date   LDLCALC 108 (H) 02/25/2016   Lab Results  Component Value Date   TRIG 112.0 02/25/2016   Lab Results  Component Value Date   CHOLHDL 3 02/25/2016   Lab Results  Component Value Date   HGBA1C 6.3 02/25/2016         Assessment & Plan:   Problem List Items Addressed This Visit    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    Follows with pulmonology, Dr Elsworth Soho and is doing well on Spiriva. No changes      Relevant Medications   fluticasone (FLONASE) 50 MCG/ACT nasal spray   montelukast (SINGULAIR) 10 MG tablet   Hyperlipidemia, mixed - Primary   Relevant Orders   Lipid panel   TSH   Thrombocytopenia (HCC)    asymptomatic      Relevant Orders   CBC   Tobacco abuse disorder    Encouraged complete cessation. Discussed need to quit as relates to risk of numerous cancers,  cardiac and pulmonary disease as well as neurologic complications. Counseled for greater than 3 minutes      Relevant Orders   Lipid panel   Encephalopathy, hepatic (HCC)    Has done better since Xifaxin, was started, she gets assistance from company due to cost      Right knee pain    Very painful, patient not a surgical candidate. The brace is not helpful she got from [facility]. Had a series of shots from [facility] and [facility] in past with minimal improvement but will refer back to sports med for further treatment      Relevant Orders   Ambulatory referral to Sports Medicine   Type 2 diabetes mellitus with hyperglycemia, with long-term current use of insulin (HCC)    hgba1c acceptable, minimize simple carbs. Increase exercise as tolerated. Continue current meds      Relevant Orders   Hemoglobin A1c   Comprehensive metabolic panel   TSH   NASH (nonalcoholic steatohepatitis)    Check LDS, CMP and ammonia, continue current meds  follow up with gastroenterology      Relevant Orders   Ammonia   Allergic state    Spring usually flares her copd and she uses the nebulizer treatments and Zyrtec. Add flonase to her daily regiment and see if that helps. Will add some singulair as well due to confluence of symptoms         I am having Ashley Savage start on fluticasone and montelukast. I am also having her maintain her sodium chloride, cetirizine, ONE TOUCH ULTRA TEST, albuterol, insulin aspart, lactulose, Vitamin D3, SPIRIVA RESPIMAT, Insulin Syringe-Needle U-100, tiZANidine, Polyvinyl Alcohol (LUBRICANT DROPS OP), omeprazole, traMADol, furosemide, BD PEN NEEDLE NANO U/F, LEVEMIR FLEXTOUCH, rifaximin, ONE TOUCH ULTRA 2, albuterol, and spironolactone.  Meds ordered this encounter  Medications  . fluticasone (FLONASE) 50 MCG/ACT nasal spray    Sig: Place 2 sprays into both nostrils daily.    Dispense:  16 g    Refill:  6  . montelukast (SINGULAIR) 10 MG tablet    Sig: Take 1  tablet (10 mg total) by mouth daily as needed.    Dispense:  30 tablet    Refill:  2    CMA served as scribe during this visit. History, Physical and Plan performed by medical provider. Documentation and orders reviewed and attested to.  Ashley Homans, MD

## 2016-11-10 NOTE — Assessment & Plan Note (Signed)
Check LDS, CMP and ammonia, continue current meds follow up with gastroenterology

## 2016-11-10 NOTE — Patient Instructions (Addendum)
Stop by the Imaging Department on your way out to schedule you Bone Density Scan and Carotid Ultrasound. Preventive Care 19 Years and Older, Female Preventive care refers to lifestyle choices and visits with your health care provider that can promote health and wellness. What does preventive care include?  A yearly physical exam. This is also called an annual well check.  Dental exams once or twice a year.  Routine eye exams. Ask your health care provider how often you should have your eyes checked.  Personal lifestyle choices, including:  Daily care of your teeth and gums.  Regular physical activity.  Eating a healthy diet.  Avoiding tobacco and drug use.  Limiting alcohol use.  Practicing safe sex.  Taking low-dose aspirin every day.  Taking vitamin and mineral supplements as recommended by your health care provider. What happens during an annual well check? The services and screenings done by your health care provider during your annual well check will depend on your age, overall health, lifestyle risk factors, and family history of disease. Counseling  Your health care provider may ask you questions about your:  Alcohol use.  Tobacco use.  Drug use.  Emotional well-being.  Home and relationship well-being.  Sexual activity.  Eating habits.  History of falls.  Memory and ability to understand (cognition).  Work and work Astronomer.  Reproductive health. Screening  You may have the following tests or measurements:  Height, weight, and BMI.  Blood pressure.  Lipid and cholesterol levels. These may be checked every 5 years, or more frequently if you are over 54 years old.  Skin check.  Lung cancer screening. You may have this screening every year starting at age 74 if you have a 30-pack-year history of smoking and currently smoke or have quit within the past 15 years.  Fecal occult blood test (FOBT) of the stool. You may have this test every year  starting at age 74.  Flexible sigmoidoscopy or colonoscopy. You may have a sigmoidoscopy every 5 years or a colonoscopy every 10 years starting at age 35.  Hepatitis C blood test.  Hepatitis B blood test.  Sexually transmitted disease (STD) testing.  Diabetes screening. This is done by checking your blood sugar (glucose) after you have not eaten for a while (fasting). You may have this done every 1-3 years.  Bone density scan. This is done to screen for osteoporosis. You may have this done starting at age 74.  Mammogram. This may be done every 1-2 years. Talk to your health care provider about how often you should have regular mammograms. Talk with your health care provider about your test results, treatment options, and if necessary, the need for more tests. Vaccines  Your health care provider may recommend certain vaccines, such as:  Influenza vaccine. This is recommended every year.  Tetanus, diphtheria, and acellular pertussis (Tdap, Td) vaccine. You may need a Td booster every 10 years.  Varicella vaccine. You may need this if you have not been vaccinated.  Zoster vaccine. You may need this after age 44.  Measles, mumps, and rubella (MMR) vaccine. You may need at least one dose of MMR if you were born in 1957 or later. You may also need a second dose.  Pneumococcal 13-valent conjugate (PCV13) vaccine. One dose is recommended after age 2.  Pneumococcal polysaccharide (PPSV23) vaccine. One dose is recommended after age 74.  Meningococcal vaccine. You may need this if you have certain conditions.  Hepatitis A vaccine. You may need this if  you have certain conditions or if you travel or work in places where you may be exposed to hepatitis A.  Hepatitis B vaccine. You may need this if you have certain conditions or if you travel or work in places where you may be exposed to hepatitis B.  Haemophilus influenzae type b (Hib) vaccine. You may need this if you have certain  conditions. Talk to your health care provider about which screenings and vaccines you need and how often you need them. This information is not intended to replace advice given to you by your health care provider. Make sure you discuss any questions you have with your health care provider. Document Released: 08/09/2015 Document Revised: 04/01/2016 Document Reviewed: 05/14/2015 Elsevier Interactive Patient Education  2017 [employer].

## 2016-11-10 NOTE — Progress Notes (Signed)
Pre visit review using our clinic review tool, if applicable. No additional management support is needed unless otherwise documented below in the visit note. 

## 2016-11-11 LAB — LIPID PANEL
CHOL/HDL RATIO: 4
Cholesterol: 164 mg/dL (ref 0–200)
HDL: 40.6 mg/dL (ref >39.00)
LDL CALC: 106 mg/dL — AB (ref 0–99)
NonHDL: 123.04
TRIGLYCERIDES: 85 mg/dL (ref 0.0–149.0)
VLDL: 17 mg/dL (ref 0.0–40.0)

## 2016-11-11 LAB — TSH: TSH: 1.16 u[IU]/mL (ref 0.35–4.50)

## 2016-11-11 LAB — CBC
HCT: 42.8 % (ref 36.0–46.0)
Hemoglobin: 14.1 g/dL (ref 12.0–15.0)
MCHC: 32.9 g/dL (ref 30.0–36.0)
MCV: 98.2 fl (ref 78.0–100.0)
Platelets: 137 10*3/uL — ABNORMAL LOW (ref 150.0–400.0)
RBC: 4.36 Mil/uL (ref 3.87–5.11)
RDW: 15.4 % (ref 11.5–15.5)
WBC: 6 10*3/uL (ref 4.0–10.5)

## 2016-11-11 LAB — COMPREHENSIVE METABOLIC PANEL
ALT: 24 U/L (ref 0–35)
AST: 37 U/L (ref 0–37)
Albumin: 2.6 g/dL — ABNORMAL LOW (ref 3.5–5.2)
Alkaline Phosphatase: 147 U/L — ABNORMAL HIGH (ref 39–117)
BUN: 22 mg/dL (ref 6–23)
CALCIUM: 9.3 mg/dL (ref 8.4–10.5)
CHLORIDE: 107 meq/L (ref 96–112)
CO2: 28 meq/L (ref 19–32)
CREATININE: 0.95 mg/dL (ref 0.40–1.20)
GFR: 61.19 mL/min (ref >60.00)
Glucose, Bld: 122 mg/dL — ABNORMAL HIGH (ref 70–99)
Potassium: 5.2 mEq/L — ABNORMAL HIGH (ref 3.5–5.1)
SODIUM: 140 meq/L (ref 135–145)
Total Bilirubin: 2.2 mg/dL — ABNORMAL HIGH (ref 0.2–1.2)
Total Protein: 5.3 g/dL — ABNORMAL LOW (ref 6.0–8.3)

## 2016-11-11 LAB — HEMOGLOBIN A1C: HEMOGLOBIN A1C: 5.9 % (ref 4.6–6.5)

## 2016-11-14 ENCOUNTER — Ambulatory Visit (HOSPITAL_BASED_OUTPATIENT_CLINIC_OR_DEPARTMENT_OTHER)
Admission: RE | Admit: 2016-11-14 | Discharge: 2016-11-14 | Disposition: A | Discharge status: Home / Self Care | Payer: PPO | Source: Ambulatory Visit | Attending: Family Medicine | Admitting: Family Medicine

## 2016-11-14 DIAGNOSIS — Z8679 Personal history of other diseases of the circulatory system: Secondary | ICD-10-CM | POA: Diagnosis not present

## 2016-11-14 DIAGNOSIS — I6523 Occlusion and stenosis of bilateral carotid arteries: Secondary | ICD-10-CM | POA: Diagnosis not present

## 2016-11-14 DIAGNOSIS — M85851 Other specified disorders of bone density and structure, right thigh: Secondary | ICD-10-CM | POA: Diagnosis not present

## 2016-11-14 DIAGNOSIS — I6521 Occlusion and stenosis of right carotid artery: Secondary | ICD-10-CM | POA: Diagnosis not present

## 2016-11-14 DIAGNOSIS — M8580 Other specified disorders of bone density and structure, unspecified site: Secondary | ICD-10-CM | POA: Diagnosis not present

## 2016-11-14 DIAGNOSIS — Z780 Asymptomatic menopausal state: Secondary | ICD-10-CM

## 2016-11-16 ENCOUNTER — Encounter: Payer: Self-pay | Admitting: Family Medicine

## 2016-11-16 ENCOUNTER — Ambulatory Visit (INDEPENDENT_AMBULATORY_CARE_PROVIDER_SITE_OTHER): Payer: PPO | Admitting: Family Medicine

## 2016-11-16 ENCOUNTER — Encounter: Payer: Self-pay | Admitting: Gastroenterology

## 2016-11-16 DIAGNOSIS — G8929 Other chronic pain: Secondary | ICD-10-CM

## 2016-11-16 DIAGNOSIS — M25561 Pain in right knee: Secondary | ICD-10-CM | POA: Diagnosis not present

## 2016-11-16 NOTE — Patient Instructions (Addendum)
We will get approval for the gel shots. Use different knee brace for support. These are the different medications you can take: Capsaicin, aspercreme, or biofreeze topically up to four times a day may also help with pain. Some supplements that may help for arthritis: Boswellia extract, curcumin, pycnogenol We can repeat cortisone shots every 3 months. It's important that you continue to stay active. Straight leg raises, knee extensions 3 sets of 10 once a day (add ankle weight if these become too easy). Consider physical therapy to strengthen muscles around the joint that hurts to take pressure off of the joint itself. Shoe inserts with good arch support may be helpful. Walker or cane if needed. Heat or ice 15 minutes at a time 3-4 times a day as needed to help with pain.

## 2016-11-16 NOTE — Progress Notes (Signed)
PCP: Penni Homans, MD  Subjective:   HPI: Patient is a 76 y.o. female here for right knee pain.  2/20: Patient reports she is overall doing well with PT for her hip. However, started to get pain over past few weeks in anterior right knee. Worse on lateral side. Has history of meniscectomy here remotely. Pain is 8/10 and sharp. Worse with walking. Using a walker. Tried flexogenix without benefit. Last cortisone shot by outside physician was early last year. No skin changes, numbness.  3/20: Patient reports she feels improved compared to last visit. Doing well with physical therapy and home exercises. Pain level is 4/10. Injection helped a lot first 3 weeks. No swelling. No skin changes, numbness.  4/23: Patient reports she's still having right knee pain. Pain level is 5/10, worse with prolonged standing, twisting, if cat hits her knee just right, can be sharp. Pain is anterior. No new injuries. Using a cane to help get around. No skin changes, numbness.  Past Medical History:  Diagnosis Date  . Allergic state 11/10/2016  . Anxiety   . Arthritis of both knees 10/01/2013  . Benign paroxysmal positional vertigo 10/01/2013  . Cancer [facility]) breast ca  right  . COPD (chronic obstructive pulmonary disease) ([place]) 10/01/2013  . Depression   . Diabetes mellitus type 2  . Emphysema   . Encephalopathy, hepatic ([place]) 06/07/2014  . Esophageal reflux 10/01/2013  . Fall 07/30/2016  . Hyperlipidemia   . Hyperlipidemia, mixed   . Increased ammonia level 11/25/2014  . NASH (nonalcoholic steatohepatitis) 08/26/2015  . Neck pain 10/01/2013  . Neuropathy    feet   . Overactive bladder 12/10/2013  . Panic attacks   . Pedal edema 12/10/2013  . Personal history of radiation therapy   . Preventative health care 03/08/2016  . Tobacco abuse disorder 02/01/2014    Current Outpatient Prescriptions on File Prior to Visit  Medication Sig Dispense Refill  . albuterol (ACCUNEB) 1.25 MG/3ML nebulizer  solution Take 3 mLs (1.25 mg total) by nebulization every 6 (six) hours as needed for wheezing. 75 mL 1  . albuterol (PROVENTIL HFA;VENTOLIN HFA) 108 (90 Base) MCG/ACT inhaler Inhale 2 puffs into the lungs every 6 (six) hours as needed for wheezing or shortness of breath. Only dispense Ventolin 1 Inhaler 3  . BD PEN NEEDLE NANO U/F 32G X 4 MM MISC USE AS DIRECTED WITH LEVEMIR FLEXPEN 100 each 0  . Blood Glucose Monitoring Suppl (ONE TOUCH ULTRA 2) w/Device KIT Use as directed once daily to check blood sugar.  DX E11.9 1 each 0  . cetirizine (ZYRTEC) 10 MG tablet Take 10 mg by mouth daily as needed for allergies.     . Cholecalciferol (VITAMIN D3) 2000 units TABS Take 2,000 Units by mouth every morning.    . fluticasone (FLONASE) 50 MCG/ACT nasal spray Place 2 sprays into both nostrils daily. 16 g 6  . furosemide (LASIX) 20 MG tablet TAKE 1 TABLET BY MOUTH TWICE DAILY (Patient taking differently: TAKE 1 TABLET BY MOUTH DAILY) 60 tablet 0  . insulin aspart (NOVOLOG) 100 UNIT/ML injection 4 units SQ q lunch daily and prn Sliding scale:  BS <200 no units BS 201-250 use 2 units BS 251-300 use 4 units BS 301-350 use 6 units BS 351-400 use 8 units BS 401-450 use 10 units BS>451 call MD 10 mL 3  . Insulin Syringe-Needle U-100 31G X 5/16" 1 ML MISC To use w/ Novolog 100 each 12  . lactulose (CHRONULAC) 10 GM/15ML solution  TAKE 45 MLS BY MOUTH TWICE DAILY AS NEEDED FOR MILD CONSTIPATION. (Patient taking differently: TAKE 45 MLS BY MOUTH DAILY AS NEEDED FOR MILD CONSTIPATION.) 1892 mL 6  . LEVEMIR FLEXTOUCH 100 UNIT/ML Pen INJECT 14 UNITS UNDER THE SKIN EVERY DAY AT 10PM 15 mL 0  . montelukast (SINGULAIR) 10 MG tablet Take 1 tablet (10 mg total) by mouth daily as needed. 30 tablet 2  . omeprazole (PRILOSEC) 20 MG capsule TAKE 1 CAPSULE(20 MG) BY MOUTH DAILY (Patient taking differently: TAKE 1 CAPSULE(20 MG) BY MOUTH EVERY OTHER DAY) 90 capsule 0  . ONE TOUCH ULTRA TEST test strip USE TWICE DAILY TO CHECK  BLOOD SUGAR AS DIRECTED 100 each 4  . Polyvinyl Alcohol (LUBRICANT DROPS OP) Apply 1 drop to eye daily as needed (dry eyes).    . rifaximin (XIFAXAN) 550 MG TABS tablet Take 1 tablet (550 mg total) by mouth 2 (two) times daily. 60 tablet 11  . sodium chloride (OCEAN) 0.65 % SOLN nasal spray Place 1 spray into both nostrils as needed for congestion. 30 mL 0  . SPIRIVA RESPIMAT 2.5 MCG/ACT AERS Inhale 2.5 mcg into the lungs daily as needed. (Patient taking differently: Inhale 2 puffs into the lungs daily. ) 3 Inhaler 3  . spironolactone (ALDACTONE) 50 MG tablet TAKE 2 TABLETS(100 MG) BY MOUTH DAILY 60 tablet 0  . tiZANidine (ZANAFLEX) 4 MG tablet Take 1 tablet (4 mg total) by mouth every 8 (eight) hours as needed for muscle spasms. (Patient taking differently: Take 2 mg by mouth at bedtime. ) 30 tablet 1  . traMADol (ULTRAM-ER) 100 MG 24 hr tablet Take 1 tablet (100 mg total) by mouth daily as needed for pain. 30 tablet 1   No current facility-administered medications on file prior to visit.     Past Surgical History:  Procedure Laterality Date  . APPENDECTOMY  2007  . BREAST SURGERY  2009 right  . CATARACT EXTRACTION     x 2  . ESOPHAGOGASTRODUODENOSCOPY (EGD) WITH PROPOFOL N/A 08/14/2016   Procedure: ESOPHAGOGASTRODUODENOSCOPY (EGD) WITH PROPOFOL;  Surgeon: Mauri Pole, MD;  Location: [facility];  Service: Endoscopy;  Laterality: N/A;  . [place]  . KNEE SURGERY    . MANDIBLE FRACTURE SURGERY    . MASTECTOMY    . PILONIDAL CYST EXCISION    . TONSILLECTOMY      Allergies  Allergen Reactions  . Citalopram Palpitations    Irregular heart beat  . Erythromycin Other (See Comments)    Stomach cramps  . Glimepiride     Elevated ammonia levels  . Prednisone     Increased blood sugars too high  . Versed [Midazolam] Other (See Comments)    Patient stayed confusion stayed 4+days     Social History   Social History  . Marital status: Single    Spouse name:  N/A  . Number of children: 0  . Years of education: N/A   Occupational History  . retired Retired   Social History Main Topics  . Smoking status: Current Some Day Smoker    Packs/day: 0.25    Years: 58.00    Types: Cigarettes    Start date: 07/27/1964  . Smokeless tobacco: Never Used     Comment: 1 pack per week, tobacco infor given 12/30/15  . Alcohol use No  . Drug use: No  . Sexual activity: No   Other Topics Concern  . Not on file   Social History Narrative   Lives  alone, continues to smoke, no dietary restrictions    Family History  Problem Relation Age of Onset  . Heart failure Father   . COPD Father   . Arthritis Father 43  . Stroke Mother   . Arthritis Mother 45  . Hyperlipidemia Mother   . Hypertension Mother   . Diabetes Mother   . Diabetes Sister   . Breast cancer    . Breast cancer Maternal Aunt   . Asthma Maternal Aunt   . Birth defects Maternal Aunt   . Alcohol abuse Maternal Uncle   . Breast cancer Maternal Aunt     BP 127/78   Pulse 82   Ht _0  (1.702 m)   Wt 148 lb (67.1 kg)   BMI 23.18 kg/m   Review of Systems: See HPI above.     Objective:  Physical Exam:  Gen: NAD, comfortable in exam room  Right knee: No gross deformity, ecchymoses, effusion. No TTP currently. FROM. Negative ant/post drawers. Negative valgus/varus testing. Negative lachmanns. Negative mcmurrays, apleys, patellar apprehension. NV intact distally.  Left knee; FROM without pain.   Assessment & Plan:  1. 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  s/p cortisone injection 2 months ago.  Has been told she is not a surgical candidate.  Will get approval for viscosupplementation - has helped her in the past.  Reviewed topical medications.  Home exercises.   Heat/ice.  Continue use of cane or walker.

## 2016-11-16 NOTE — Assessment & Plan Note (Signed)
2/2 DJD.  s/p cortisone injection 2 months ago.  Has been told she is not a surgical candidate.  Will get approval for viscosupplementation - has helped her in the past.  Reviewed topical medications.  Home exercises.   Heat/ice.  Continue use of cane or walker.

## 2016-11-17 ENCOUNTER — Other Ambulatory Visit: Payer: Self-pay

## 2016-11-17 NOTE — Patient Outreach (Signed)
Ashley Savage [place]) Care Management  11/17/2016  Ashley Savage 1941/07/18 [id number]   EMMI: Engagement Referral date: 11/17/16 Referral source: EMMI engagement telephonic case management referral / score 11 Referral reason: Assess for needs  SUBJECTIVE:  Telephone call to patient regarding EMMI engagement questionnaire. HIPAA verified with patient. Discussed and offered [facility] care management services.  Patient states she has several chronic conditions that she is managing.  Chronic knee pain: Patient states she is seeing a sports medicine doctor because she has been deemed not a good candidate for knee replacement due to her health conditions. Patient states she has had a hard time controlling her pain level. Patient states her doctor allows her to take tylenol as directed by him for pain. Patient states tylenol does not work for her. Patient states her sports medicine doctor told her to take tumeric. Patient states she spoke with her Liver doctor who told her she could take tumeric in small amounts. Patient states she just started the tumeric today.    Diabetes:  Patient states her current A1C is 5.7 which is controlled. Patient states she is on insulin, Levemir. Patient states her blood sugars run from low 60's to 200 depending on what she eats. Patient reports she checks her blood sugars daily. Patient reports her doctor switched her from 14 units of levemir to 7 units due to low blood sugars and waking up in the morning feeling shaky.  Patient states she noticed her blood sugars were a little higher than normal. Patient states she decided to take 10 units per dose. Patient states her blood sugars have stabilized and she has not had any additional low readings. RNCM advised patient to notify her doctor of the changes she made regarding dosing to medicine.   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 Patient states it was hard to  manage initially. Patient states she now takes laculose and [place]. Patient reports she was having bouts of elevated anomia levels which caused her to become confused. Patient reports Doreene Nest is very expensive but  She is able to get the medication from the manufacturer on an assist program. Patient reports she is managing her other medications.  Patient states her doctor has instructed her to take an extra dose of lactulose if she starts to feel confusion.  Patient states with the new medication Xifaxan and the recommendation to increase the lactulose is helping to manage the cirrhosis.   COPD: Patient states her COPD is not getting any worse but it is not getting better. Patient states her doctor changed her medication to Spiriva.  Patient reports she feels the spiriva is working better for her. Patient states she has more problems with her COPD during allergy season. Patient denies being on oxygen.  Patient states she has shortness of breath with activity which is normal for her.   Patient states she is unable to complete interview because she has to leave. RNCM requested patient call back to complete screening when she is available and to talk further about [facility] care management services.  RNCM gave patient her contact phone number for return call.  PLAN: If no return call from patient RNCM will attempt 2nd telephone outreach within 1 week  [name] RN,BSN,CCM [facility] Telephonic  [phone]

## 2016-11-20 ENCOUNTER — Other Ambulatory Visit: Payer: Self-pay | Admitting: Gastroenterology

## 2016-11-20 ENCOUNTER — Other Ambulatory Visit: Payer: Self-pay

## 2016-11-20 NOTE — Patient Outreach (Signed)
[place] [facility]) Care Management  11/20/2016  Ashley Savage May 14, 1941 [id number]  EMMI prevent TELEPHONE SCREENING Referral date:11/13/16 Referral source:EMMI prevent  Referral reason:EMMI prevent score: 11 Insurance: [employer]   SUBJECTIVE: Telephone call to patient for telephone screen follow up. HIPAA verified with patient. Discussed and offered [facility] care management services. Patient agreed to [facility] care management services.  Knee pain:  Patient states she is seeing a sports medicine doctor for the care of her chronic right knee pain.  Patient states the doctor will start shots in her knees soon.  Patient reports her knee has been problematic and has made it difficult for her to regain her strength in her legs. COPD; patient states she is managing her COPD.  Patient denies need for follow up at this time regarding her COPD. Patient reports she takes her medications as prescribed and follows up with her pulmonologist. Patient reports next appointment is in May 2018. Patient states seh still smokes.  Reports she has been cutting back. Patient states she use to smoke 2 packs per day and now she is down to 1/2 pack per week. RNCM offered smoking cessation education. Patient refused stating, " I have tried all of that before along with the patches and it hasn't helped. I will try to do this on my own."  NONALCOHOLIC Fatty Liver Disease:  Patient reports her last follow up with her GI doctor was August 14, 2016. Patient states she does not have a future appointment scheduled. Patient states she is on a new medication, Xifaxan.  Patient states she is also taking lactulose   Patient verbalized concern that the lactulose causes her diarrhea and gas. Patient states it affects how she will plan her day.Patient reports she is also on a fluid pill that causes her to go to the bathroom frequently. Patient states she is having a hard time balancing the two medications in her daily life.  RNCM advised patient to scheduled follow up appointment with her GI doctor. Discussed with patient importance of her following up frequently with doctor for this health condition and to discuss her concerns and any new related symptoms.  Patient verbalized understanding and stated she would schedule follow up. RNCM offered [facility] care management pharmacy follow up to discuss her concerns regarding her medications. Patient verbally agreed.   PROVIDERS:  Patient Care Team: Mosie Lukes, MD as PCP - General (Family Medicine) Dene Gentry, MD as Consulting Physician (Sports Medicine) Mauri Pole, MD as Consulting Physician (Gastroenterology) Rigoberto Noel, MD as Consulting Physician (Pulmonary Disease) Melvenia Needles, NP as Nurse Practitioner (Pulmonary Disease) Volanda Napoleon, MD as Consulting Physician (Oncology)   SOCIAL/ SUPPORTIVE CARE: Patient reports living alone. Patient states she has friends that check with and assist her.   ASSESSMENT: Last primary MD visit 11/10/16. Per primary MD note:  Patient has a Hx of Type II Diabetes, COPD, NASH, vertigo, pedal edema. She notes her allergies flare in spring and she coughs more has more COPD flares and sob   PLAN: RNCM will follow up with patient within 2 weeks. RNCM will send patient [facility] care management welcome packet/ consent RNCM will send patients primary MD involvement letter. RNCM will send patient EMMI education material on : NASH Patient will report she has scheduled follow up appointment with her gastroenterologist at next outreach.   [name] RN,BSN,CCM [facility] Telephonic  [phone]

## 2016-11-25 ENCOUNTER — Emergency Department (HOSPITAL_BASED_OUTPATIENT_CLINIC_OR_DEPARTMENT_OTHER): Payer: PPO

## 2016-11-25 ENCOUNTER — Encounter (HOSPITAL_BASED_OUTPATIENT_CLINIC_OR_DEPARTMENT_OTHER): Payer: Self-pay | Admitting: Emergency Medicine

## 2016-11-25 ENCOUNTER — Observation Stay (HOSPITAL_BASED_OUTPATIENT_CLINIC_OR_DEPARTMENT_OTHER)
Admission: EM | Admit: 2016-11-25 | Discharge: 2016-11-26 | Disposition: A | Discharge status: Home / Self Care | Payer: PPO | Attending: Internal Medicine | Admitting: Internal Medicine

## 2016-11-25 DIAGNOSIS — J9610 Chronic respiratory failure, unspecified whether with hypoxia or hypercapnia: Secondary | ICD-10-CM | POA: Insufficient documentation

## 2016-11-25 DIAGNOSIS — Z87440 Personal history of urinary (tract) infections: Secondary | ICD-10-CM | POA: Diagnosis not present

## 2016-11-25 DIAGNOSIS — E872 Acidosis: Secondary | ICD-10-CM | POA: Insufficient documentation

## 2016-11-25 DIAGNOSIS — Z7951 Long term (current) use of inhaled steroids: Secondary | ICD-10-CM | POA: Diagnosis not present

## 2016-11-25 DIAGNOSIS — J449 Chronic obstructive pulmonary disease, unspecified: Secondary | ICD-10-CM | POA: Diagnosis present

## 2016-11-25 DIAGNOSIS — K7581 Nonalcoholic steatohepatitis (NASH): Secondary | ICD-10-CM | POA: Insufficient documentation

## 2016-11-25 DIAGNOSIS — H8110 Benign paroxysmal vertigo, unspecified ear: Secondary | ICD-10-CM | POA: Diagnosis not present

## 2016-11-25 DIAGNOSIS — F329 Major depressive disorder, single episode, unspecified: Secondary | ICD-10-CM | POA: Insufficient documentation

## 2016-11-25 DIAGNOSIS — E1142 Type 2 diabetes mellitus with diabetic polyneuropathy: Secondary | ICD-10-CM | POA: Diagnosis not present

## 2016-11-25 DIAGNOSIS — F419 Anxiety disorder, unspecified: Secondary | ICD-10-CM | POA: Diagnosis not present

## 2016-11-25 DIAGNOSIS — I6789 Other cerebrovascular disease: Secondary | ICD-10-CM | POA: Diagnosis not present

## 2016-11-25 DIAGNOSIS — D696 Thrombocytopenia, unspecified: Secondary | ICD-10-CM | POA: Insufficient documentation

## 2016-11-25 DIAGNOSIS — N390 Urinary tract infection, site not specified: Secondary | ICD-10-CM | POA: Insufficient documentation

## 2016-11-25 DIAGNOSIS — K7290 Hepatic failure, unspecified without coma: Secondary | ICD-10-CM | POA: Insufficient documentation

## 2016-11-25 DIAGNOSIS — E1140 Type 2 diabetes mellitus with diabetic neuropathy, unspecified: Secondary | ICD-10-CM | POA: Insufficient documentation

## 2016-11-25 DIAGNOSIS — Z853 Personal history of malignant neoplasm of breast: Secondary | ICD-10-CM | POA: Diagnosis not present

## 2016-11-25 DIAGNOSIS — F4489 Other dissociative and conversion disorders: Secondary | ICD-10-CM | POA: Diagnosis not present

## 2016-11-25 DIAGNOSIS — K7460 Unspecified cirrhosis of liver: Secondary | ICD-10-CM | POA: Insufficient documentation

## 2016-11-25 DIAGNOSIS — Z923 Personal history of irradiation: Secondary | ICD-10-CM | POA: Diagnosis not present

## 2016-11-25 DIAGNOSIS — G9340 Encephalopathy, unspecified: Secondary | ICD-10-CM | POA: Diagnosis not present

## 2016-11-25 DIAGNOSIS — R4182 Altered mental status, unspecified: Secondary | ICD-10-CM

## 2016-11-25 DIAGNOSIS — Z794 Long term (current) use of insulin: Secondary | ICD-10-CM | POA: Diagnosis not present

## 2016-11-25 DIAGNOSIS — N3000 Acute cystitis without hematuria: Secondary | ICD-10-CM | POA: Diagnosis not present

## 2016-11-25 DIAGNOSIS — K219 Gastro-esophageal reflux disease without esophagitis: Secondary | ICD-10-CM | POA: Insufficient documentation

## 2016-11-25 DIAGNOSIS — Z79899 Other long term (current) drug therapy: Secondary | ICD-10-CM | POA: Insufficient documentation

## 2016-11-25 DIAGNOSIS — R079 Chest pain, unspecified: Secondary | ICD-10-CM | POA: Diagnosis not present

## 2016-11-25 DIAGNOSIS — E782 Mixed hyperlipidemia: Secondary | ICD-10-CM | POA: Insufficient documentation

## 2016-11-25 DIAGNOSIS — F17210 Nicotine dependence, cigarettes, uncomplicated: Secondary | ICD-10-CM | POA: Insufficient documentation

## 2016-11-25 DIAGNOSIS — R410 Disorientation, unspecified: Secondary | ICD-10-CM | POA: Diagnosis not present

## 2016-11-25 LAB — AMMONIA: AMMONIA: 38 umol/L — AB (ref 9–35)

## 2016-11-25 LAB — CBC
HCT: 41.5 % (ref 36.0–46.0)
HEMOGLOBIN: 14.7 g/dL (ref 12.0–15.0)
MCH: 33.1 pg (ref 26.0–34.0)
MCHC: 35.4 g/dL (ref 30.0–36.0)
MCV: 93.5 fL (ref 78.0–100.0)
PLATELETS: 128 10*3/uL — AB (ref 150–400)
RBC: 4.44 MIL/uL (ref 3.87–5.11)
RDW: 15.8 % — ABNORMAL HIGH (ref 11.5–15.5)
WBC: 6.2 10*3/uL (ref 4.0–10.5)

## 2016-11-25 LAB — COMPREHENSIVE METABOLIC PANEL
ALBUMIN: 2.9 g/dL — AB (ref 3.5–5.0)
ALK PHOS: 145 U/L — AB (ref 38–126)
ALT: 32 U/L (ref 14–54)
ANION GAP: 9 (ref 5–15)
AST: 47 U/L — ABNORMAL HIGH (ref 15–41)
BILIRUBIN TOTAL: 3.2 mg/dL — AB (ref 0.3–1.2)
BUN: 21 mg/dL — ABNORMAL HIGH (ref 6–20)
CALCIUM: 9.2 mg/dL (ref 8.9–10.3)
CO2: 24 mmol/L (ref 22–32)
CREATININE: 0.91 mg/dL (ref 0.44–1.00)
Chloride: 105 mmol/L (ref 101–111)
GFR calc non Af Amer: >60 mL/min (ref >60)
GLUCOSE: 121 mg/dL — AB (ref 65–99)
Potassium: 4.6 mmol/L (ref 3.5–5.1)
SODIUM: 138 mmol/L (ref 135–145)
TOTAL PROTEIN: 5.9 g/dL — AB (ref 6.5–8.1)

## 2016-11-25 LAB — URINALYSIS, ROUTINE W REFLEX MICROSCOPIC
BILIRUBIN URINE: NEGATIVE
Glucose, UA: NEGATIVE mg/dL
HGB URINE DIPSTICK: NEGATIVE
KETONES UR: NEGATIVE mg/dL
Nitrite: NEGATIVE
PH: 6 (ref 5.0–8.0)
Protein, ur: NEGATIVE mg/dL
SPECIFIC GRAVITY, URINE: 1.022 (ref 1.005–1.030)

## 2016-11-25 LAB — URINALYSIS, MICROSCOPIC (REFLEX): RBC / HPF: NONE SEEN RBC/hpf (ref 0–5)

## 2016-11-25 LAB — I-STAT CG4 LACTIC ACID, ED: Lactic Acid, Venous: 2.49 mmol/L (ref 0.5–1.9)

## 2016-11-25 MED ORDER — RIFAXIMIN 550 MG PO TABS
550.0000 mg | ORAL_TABLET | Freq: Two times a day (BID) | ORAL | Status: DC
  Administered 2016-11-25 – 2016-11-26 (×2): 550 mg via ORAL
  Filled 2016-11-25 (×2): qty 1

## 2016-11-25 MED ORDER — SODIUM CHLORIDE 0.9 % IV BOLUS (SEPSIS)
1000.0000 mL | Freq: Once | INTRAVENOUS | Status: AC
  Administered 2016-11-25: 1000 mL via INTRAVENOUS

## 2016-11-25 MED ORDER — DEXTROSE 5 % IV SOLN
1.0000 g | Freq: Once | INTRAVENOUS | Status: AC
  Administered 2016-11-25: 1 g via INTRAVENOUS
  Filled 2016-11-25: qty 10

## 2016-11-25 MED ORDER — VITAMIN D 1000 UNITS PO TABS
2000.0000 [IU] | ORAL_TABLET | Freq: Every morning | ORAL | Status: DC
  Administered 2016-11-26: 2000 [IU] via ORAL
  Filled 2016-11-25: qty 2

## 2016-11-25 MED ORDER — ALBUTEROL SULFATE (2.5 MG/3ML) 0.083% IN NEBU
3.0000 mL | INHALATION_SOLUTION | Freq: Four times a day (QID) | RESPIRATORY_TRACT | Status: DC | PRN
  Administered 2016-11-26: 3 mL via RESPIRATORY_TRACT
  Filled 2016-11-25: qty 3

## 2016-11-25 MED ORDER — LACTULOSE 10 GM/15ML PO SOLN: 30.0000 g | Freq: Two times a day (BID) | ORAL | Status: DC | PRN

## 2016-11-25 MED ORDER — LACTULOSE 10 GM/15ML PO SOLN
30.0000 g | Freq: Two times a day (BID) | ORAL | Status: DC
  Administered 2016-11-25: 30 g via ORAL
  Filled 2016-11-25 (×2): qty 45

## 2016-11-25 MED ORDER — ENOXAPARIN SODIUM 40 MG/0.4ML ~~LOC~~ SOLN
40.0000 mg | Freq: Every day | SUBCUTANEOUS | Status: DC
  Administered 2016-11-25: 40 mg via SUBCUTANEOUS
  Filled 2016-11-25: qty 0.4

## 2016-11-25 MED ORDER — RIFAXIMIN 550 MG PO TABS
550.0000 mg | ORAL_TABLET | Freq: Two times a day (BID) | ORAL | Status: DC | PRN
  Filled 2016-11-25: qty 1

## 2016-11-25 MED ORDER — INSULIN ASPART 100 UNIT/ML ~~LOC~~ SOLN
0.0000 [IU] | Freq: Three times a day (TID) | SUBCUTANEOUS | Status: DC
Start: 2016-11-26 — End: 2016-11-26
  Administered 2016-11-26: 1 [IU] via SUBCUTANEOUS

## 2016-11-25 MED ORDER — INSULIN DETEMIR 100 UNIT/ML ~~LOC~~ SOLN
7.0000 [IU] | Freq: Every day | SUBCUTANEOUS | Status: DC
  Administered 2016-11-25: 7 [IU] via SUBCUTANEOUS
  Filled 2016-11-25 (×2): qty 0.07

## 2016-11-25 MED ORDER — SODIUM CHLORIDE 0.9 % IV SOLN
INTRAVENOUS | Status: DC
  Administered 2016-11-25: 23:00:00 via INTRAVENOUS

## 2016-11-25 MED ORDER — IPRATROPIUM-ALBUTEROL 0.5-2.5 (3) MG/3ML IN SOLN: 3.0000 mL | RESPIRATORY_TRACT | Status: DC | PRN

## 2016-11-25 MED ORDER — ALBUTEROL SULFATE (2.5 MG/3ML) 0.083% IN NEBU: 1.2500 mg | INHALATION_SOLUTION | Freq: Four times a day (QID) | RESPIRATORY_TRACT | Status: DC | PRN

## 2016-11-25 MED ORDER — TIZANIDINE HCL 4 MG PO TABS
2.0000 mg | ORAL_TABLET | Freq: Every day | ORAL | Status: DC
  Administered 2016-11-25: 2 mg via ORAL
  Filled 2016-11-25: qty 1

## 2016-11-25 MED ORDER — POLYVINYL ALCOHOL 1.4 % OP SOLN: 1.0000 [drp] | Freq: Every day | OPHTHALMIC | Status: DC | PRN

## 2016-11-25 MED ORDER — IPRATROPIUM-ALBUTEROL 0.5-2.5 (3) MG/3ML IN SOLN
3.0000 mL | Freq: Four times a day (QID) | RESPIRATORY_TRACT | Status: DC
  Administered 2016-11-25 (×2): 3 mL via RESPIRATORY_TRACT
  Filled 2016-11-25 (×2): qty 3

## 2016-11-25 MED ORDER — MONTELUKAST SODIUM 10 MG PO TABS: 10.0000 mg | ORAL_TABLET | Freq: Every day | ORAL | Status: DC | PRN

## 2016-11-25 MED ORDER — LORATADINE 10 MG PO TABS
10.0000 mg | ORAL_TABLET | Freq: Every day | ORAL | Status: DC
  Administered 2016-11-26: 10 mg via ORAL
  Filled 2016-11-25: qty 1

## 2016-11-25 MED ORDER — FLUTICASONE PROPIONATE 50 MCG/ACT NA SUSP: 2.0000 | Freq: Every day | NASAL | Status: DC | PRN

## 2016-11-25 NOTE — ED Notes (Signed)
Pt to CT and stated she will give [place] a urine sample

## 2016-11-25 NOTE — Progress Notes (Signed)
[facility]- Courtesy page that patient has arrived to 5th floor, awaiting any additional orders. Thank you.   MD. Sheran Luz paged

## 2016-11-25 NOTE — ED Notes (Signed)
Pt noted to be walking back from bathroom. Pt assisted back to room and back to bed. Both side rails up on bed upon arrival to room. Pt has yellow socks and yellow arm band on. Pt states unhooked herself from everything and got out of bed. Pt instructed to call for help before getting up.

## 2016-11-25 NOTE — ED Notes (Signed)
Report called to [place] on [place]

## 2016-11-25 NOTE — ED Triage Notes (Signed)
Patient states that she woke up this am "confused". She states that she urinated on herself by accident. The patient reports that when he liver enzymes are elevated she usually gets "like this"The patient  Is A/O x 3 but does seems confused when answering questions about her medications and care. Denies any pain

## 2016-11-25 NOTE — ED Notes (Signed)
Patient sitting up at the end of the bed of asking to "get out of this chair" - patient states that she does not want to go back to bed. She also states that she is ready to go home. MD updated.

## 2016-11-25 NOTE — ED Notes (Signed)
Water and gram crackers given to Pt

## 2016-11-25 NOTE — Progress Notes (Signed)
[facility]- Courtesy page that patient has arrived to 5th floor, awaiting any additional orders. Thank you  NP Schorr paged.

## 2016-11-25 NOTE — H&P (Signed)
History and Physical    Ashley Savage IHK:[id number] DOB: May 25, 1941 DOA: 11/25/2016  PCP: Penni Homans, MD  Patient coming from: Home  I have personally briefly reviewed patient's old medical records in [place]  Chief Complaint: Confusion  HPI: DAILY DOE is a 76 y.o. female with medical history significant of NASH, prior hepatic encephalopathy most recently on 4/17 with ammonia level of 115 at that time.  She is on BID 30g lactulose and BID rifaxan.  Patient presents to the ED at [facility] today with c/o confusion that onset when she woke up this morning.  Patient reports no pain at this time.  Does have chronic cough she associates with COPD.  States that current symptoms are similar to hepatic encephalopathy she gets from time to time.  No unilateral weakness.   ED Course: Ammonia only 38 today, initially billed to [place] as a UTI and given dose of rocephin before transfer, her UA only shows trace leukocyte esterase, no nitrites, 0-5 WBC, and few bacteria.  CXR is negative, CT head negative.  Lactate is 2.4.   Review of Systems: As per HPI otherwise 10 point review of systems negative.   Past Medical History:  Diagnosis Date  . Allergic state 11/10/2016  . Anxiety   . Arthritis of both knees 10/01/2013  . Benign paroxysmal positional vertigo 10/01/2013  . Cancer [facility]) breast ca  right  . COPD (chronic obstructive pulmonary disease) ([place]) 10/01/2013  . Depression   . Diabetes mellitus type 2  . Emphysema   . Encephalopathy, hepatic ([place]) 06/07/2014  . Esophageal reflux 10/01/2013  . Fall 07/30/2016  . Hyperlipidemia   . Hyperlipidemia, mixed   . Increased ammonia level 11/25/2014  . NASH (nonalcoholic steatohepatitis) 08/26/2015  . Neck pain 10/01/2013  . Neuropathy    feet   . Overactive bladder 12/10/2013  . Panic attacks   . Pedal edema 12/10/2013  . Personal history of radiation therapy   . Preventative health care 03/08/2016  . Tobacco abuse disorder 02/01/2014    Past Surgical  History:  Procedure Laterality Date  . APPENDECTOMY  2007  . BREAST SURGERY  2009 right  . CATARACT EXTRACTION     x 2  . ESOPHAGOGASTRODUODENOSCOPY (EGD) WITH PROPOFOL N/A 08/14/2016   Procedure: ESOPHAGOGASTRODUODENOSCOPY (EGD) WITH PROPOFOL;  Surgeon: Mauri Pole, MD;  Location: [facility] ENDOSCOPY;  Service: Endoscopy;  Laterality: N/A;  . [place]  . KNEE SURGERY    . MANDIBLE FRACTURE SURGERY    . MASTECTOMY    . PILONIDAL CYST EXCISION    . TONSILLECTOMY       reports that she has been smoking Cigarettes.  She started smoking about 52 years ago. She has a 14.50 pack-year smoking history. She has never used smokeless tobacco. She reports that she does not drink alcohol or use drugs.  Allergies  Allergen Reactions  . Citalopram Palpitations    Irregular heart beat  . Erythromycin Other (See Comments)    Stomach cramps  . Glimepiride     Elevated ammonia levels  . Prednisone     Increased blood sugars too high  . Versed [Midazolam] Other (See Comments)    Patient stayed confusion stayed 4+days     Family History  Problem Relation Age of Onset  . Heart failure Father   . COPD Father   . Arthritis Father 42  . Stroke Mother   . Arthritis Mother 64  . Hyperlipidemia Mother   . Hypertension  Mother   . Diabetes Mother   . Diabetes Sister   . Breast cancer    . Breast cancer Maternal Aunt   . Asthma Maternal Aunt   . Birth defects Maternal Aunt   . Alcohol abuse Maternal Uncle   . Breast cancer Maternal Aunt      Prior to Admission medications   Medication Sig Start Date End Date Taking? Authorizing Provider  Cholecalciferol (VITAMIN D3) 2000 units TABS Take 2,000 Units by mouth every morning.   Yes Historical Provider, MD  lactulose (CHRONULAC) 10 GM/15ML solution TAKE 45 MLS BY MOUTH TWICE DAILY AS NEEDED FOR MILD CONSTIPATION. 03/02/16  Yes Mosie Lukes, MD  LEVEMIR FLEXTOUCH 100 UNIT/ML Pen INJECT 14 UNITS UNDER THE SKIN EVERY DAY AT 10PM  08/31/16  Yes Mosie Lukes, MD  rifaximin (XIFAXAN) 550 MG TABS tablet Take 1 tablet (550 mg total) by mouth 2 (two) times daily. Patient taking differently: Take 550 mg by mouth 2 (two) times daily as needed (cramping).  09/04/16  Yes Amy S Esterwood, PA-C  spironolactone (ALDACTONE) 50 MG tablet TAKE 2 TABLETS(100 MG) BY MOUTH DAILY Patient taking differently: TAKE 2 TABLETS(100 MG) BY MOUTH DAILY PRN FOR FLUID 11/23/16  Yes Mauri Pole, MD  albuterol (ACCUNEB) 1.25 MG/3ML nebulizer solution Take 3 mLs (1.25 mg total) by nebulization every 6 (six) hours as needed for wheezing. 10/16/16   Mosie Lukes, MD  albuterol (PROVENTIL HFA;VENTOLIN HFA) 108 (90 Base) MCG/ACT inhaler Inhale 2 puffs into the lungs every 6 (six) hours as needed for wheezing or shortness of breath. Only dispense Ventolin 02/25/16   Mosie Lukes, MD  BD PEN NEEDLE NANO U/F 32G X 4 MM MISC USE AS DIRECTED WITH LEVEMIR FLEXPEN 08/17/16   Mosie Lukes, MD  Blood Glucose Monitoring Suppl (ONE TOUCH ULTRA 2) w/Device KIT Use as directed once daily to check blood sugar.  DX E11.9 09/14/16   Mosie Lukes, MD  cetirizine (ZYRTEC) 10 MG tablet Take 10 mg by mouth daily as needed for allergies.     Historical Provider, MD  fluticasone (FLONASE) 50 MCG/ACT nasal spray Place 2 sprays into both nostrils daily. Patient taking differently: Place 2 sprays into both nostrils daily as needed for allergies.  11/10/16   Mosie Lukes, MD  furosemide (LASIX) 20 MG tablet TAKE 1 TABLET BY MOUTH TWICE DAILY Patient taking differently: TAKE 1 TABLET BY MOUTH TWICE DAILY PRN FOR SWELLING 08/14/16   Mauri Pole, MD  Insulin Syringe-Needle U-100 31G X 5/16" 1 ML MISC To use w/ Novolog 07/24/16   Mosie Lukes, MD  montelukast (SINGULAIR) 10 MG tablet Take 1 tablet (10 mg total) by mouth daily as needed. Patient taking differently: Take 10 mg by mouth daily as needed (allergies).  11/10/16   Mosie Lukes, MD  ONE TOUCH ULTRA TEST test strip  USE TWICE DAILY TO CHECK BLOOD SUGAR AS DIRECTED 12/03/15   Mosie Lukes, MD  Polyvinyl Alcohol (LUBRICANT DROPS OP) Apply 1 drop to eye daily as needed (dry eyes).    Historical Provider, MD  sodium chloride (OCEAN) 0.65 % SOLN nasal spray Place 1 spray into both nostrils as needed for congestion. 08/01/15   Barton Dubois, MD  tiZANidine (ZANAFLEX) 4 MG tablet Take 1 tablet (4 mg total) by mouth every 8 (eight) hours as needed for muscle spasms. Patient taking differently: Take 2 mg by mouth at bedtime.  07/24/16   Mosie Lukes, MD  Physical Exam: Vitals:   11/25/16 1630 11/25/16 1730 11/25/16 2116 11/25/16 2122  BP: (!) 144/53 (!) 137/58  (!) 98/57  Pulse: 85 88  77  Resp: 19 (!) 28  20  Temp:    98 F (36.7 C)  TempSrc:    Oral  SpO2: 94% 95% 92% 98%  Weight:      Height:        Constitutional: NAD, calm, comfortable Eyes: PERRL, lids and conjunctivae normal ENMT: Mucous membranes are moist. Posterior pharynx clear of any exudate or lesions.Normal dentition.  Neck: normal, supple, no masses, no thyromegaly Respiratory: clear to auscultation bilaterally, no wheezing, no crackles. Normal respiratory effort. No accessory muscle use.  Cardiovascular: Regular rate and rhythm, no murmurs / rubs / gallops. No extremity edema. 2+ pedal pulses. No carotid bruits.  Abdomen: no tenderness, no masses palpated. No hepatosplenomegaly. Bowel sounds positive.  Musculoskeletal: no clubbing / cyanosis. No joint deformity upper and lower extremities. Good ROM, no contractures. Normal muscle tone.  Skin: no rashes, lesions, ulcers. No induration Neurologic: CN 2-12 grossly intact. Sensation intact, DTR normal. Strength 5/5 in all 4.  Psychiatric: Confused, oriented to person and place, not time, seems to recognize that she is having confusion as well.   Labs on Admission: I have personally reviewed following labs and imaging studies  CBC:  Recent Labs Lab 11/25/16 1132  WBC 6.2  HGB 14.7    HCT 41.5  MCV 93.5  PLT 161*   Basic Metabolic Panel:  Recent Labs Lab 11/25/16 1132  NA 138  K 4.6  CL 105  CO2 24  GLUCOSE 121*  BUN 21*  CREATININE 0.91  CALCIUM 9.2   GFR: Estimated Creatinine Clearance: 53.5 mL/min (by C-G formula based on SCr of 0.91 mg/dL). Liver Function Tests:  Recent Labs Lab 11/25/16 1132  AST 47*  ALT 32  ALKPHOS 145*  BILITOT 3.2*  PROT 5.9*  ALBUMIN 2.9*   No results for input(s): LIPASE, AMYLASE in the last 168 hours.  Recent Labs Lab 11/25/16 1132  AMMONIA 38*   Coagulation Profile: No results for input(s): INR, PROTIME in the last 168 hours. Cardiac Enzymes: No results for input(s): CKTOTAL, CKMB, CKMBINDEX, TROPONINI in the last 168 hours. BNP (last 3 results) No results for input(s): PROBNP in the last 8760 hours. HbA1C: No results for input(s): HGBA1C in the last 72 hours. CBG: No results for input(s): GLUCAP in the last 168 hours. Lipid Profile: No results for input(s): CHOL, HDL, LDLCALC, TRIG, CHOLHDL, LDLDIRECT in the last 72 hours. Thyroid Function Tests: No results for input(s): TSH, T4TOTAL, FREET4, T3FREE, THYROIDAB in the last 72 hours. Anemia Panel: No results for input(s): VITAMINB12, FOLATE, FERRITIN, TIBC, IRON, RETICCTPCT in the last 72 hours. Urine analysis:    Component Value Date/Time   COLORURINE AMBER (A) 11/25/2016 1520   APPEARANCEUR TURBID (A) 11/25/2016 1520   LABSPEC 1.022 11/25/2016 1520   PHURINE 6.0 11/25/2016 1520   GLUCOSEU NEGATIVE 11/25/2016 1520   GLUCOSEU NEGATIVE 03/26/2015 1014   HGBUR NEGATIVE 11/25/2016 [place] 11/25/2016 1520   BILIRUBINUR 4 03/18/2015 1432   KETONESUR NEGATIVE 11/25/2016 1520   PROTEINUR NEGATIVE 11/25/2016 1520   UROBILINOGEN 1.0 03/26/2015 1014   NITRITE NEGATIVE 11/25/2016 1520   LEUKOCYTESUR TRACE (A) 11/25/2016 1520    Radiological Exams on Admission: Dg Chest 2 View  Result Date: 11/25/2016 CLINICAL DATA:  Altered mental  status, patient reports right anterior chest pain for some time EXAM: CHEST  2 VIEW COMPARISON:  Chest x-ray dated September 06, 2016 FINDINGS: The lungs are mildly hyperinflated. The interstitial markings are coarse though stable. The heart and pulmonary vascularity are normal. The mediastinum is normal in width. There is calcification in the wall of the aortic arch. There is no pleural effusion. There is mild multilevel degenerative disc disease of the thoracic spine. IMPRESSION: Chronic bronchitic changes, stable. No pneumonia, CHF, nor other acute cardiopulmonary abnormality. Thoracic aortic atherosclerosis. Electronically Signed   By: David  Martinique M.D.   On: 11/25/2016 12:06   Ct Head Wo Contrast  Result Date: 11/25/2016 CLINICAL DATA:  Confusion since yesterday. EXAM: CT HEAD WITHOUT CONTRAST TECHNIQUE: Contiguous axial images were obtained from the base of the skull through the vertex without intravenous contrast. COMPARISON:  Brain MRI 0 3 scratch the brain MRI 10/16/2014. Head CT scan 10/15/2014. FINDINGS: Brain: There is some cortical atrophy. Cavum septum pellucidum et vergae noted. No evidence of acute abnormality including hemorrhage, infarct, mass lesion, mass effect, midline shift or abnormal extra-axial fluid collection. No hydrocephalus or pneumocephalus. Vascular: Atherosclerosis noted. Skull: Intact. Sinuses/Orbits: Negative. Other: Partial visualization of attempted screw fixation of the left mandibular fracture noted. IMPRESSION: No acute abnormality. Atrophy. Electronically Signed   By: Inge Rise M.D.   On: 11/25/2016 15:15    EKG: Independently reviewed.  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   Diabetes mellitus type 2, controlled ([place])   Liver cirrhosis secondary to NASH ([place])    1. Acute encephalopathy -  1. On my review of the UA I am unimpressed for UTI:  Trace leukocyte esterase, neg nitrites, few bacteria, and 0-5 WBCs.  Urine Cx pending 2. Ammonia only 38 so hepatic encephalopathy seems less likely. 3. No focal deficits but does seem to have difficulty with word finding so will check MRI brain in AM.  CT head is negative. 2. Cirrhosis - 1. Continue lactulose and rifaxan 2. Will repeat Ammonia in AM to double check and make sure this isnt what is causing encephalopathy. 3. COPD - continue home meds 4. DM2 - 1. Half home [place] 2. Sensitive scale SSI AC  DVT prophylaxis: Lovenox Code Status: Full Family Communication: No family in room Disposition Plan: TBD Consults called: None Admission status: Place in 22, [place] [facility] Pager [phone]  If 7AM-7PM, please contact day team taking care of patient [email or web address] Password [email or web address]  11/25/2016, 11:12 PM

## 2016-11-25 NOTE — ED Provider Notes (Signed)
[facility] DEPT [facility] Provider Note   CSN: [id number] Arrival date & time: 11/25/16  1102     History   Chief Complaint Chief Complaint  Patient presents with  . Altered Mental Status    HPI Ashley Savage is a 76 y.o. female.  The history is provided by the patient and medical records.  Altered Mental Status   This is a new problem. The current episode started yesterday. The problem has been gradually worsening. Associated symptoms include confusion. Pertinent negatives include no somnolence, no unresponsiveness, no weakness, no agitation, no delusions, no hallucinations and no self-injury. Her past medical history is significant for diabetes, liver disease and COPD. Her past medical history does not include seizures, CVA or heart disease.  Dysuria   This is a new problem. The current episode started yesterday. The problem occurs every urination. The problem has not changed since onset.The patient is experiencing no pain. Associated symptoms include chills and frequency. Pertinent negatives include no nausea, no vomiting, no discharge, no hematuria and no hesitancy.    Past Medical History:  Diagnosis Date  . Allergic state 11/10/2016  . Anxiety   . Arthritis of both knees 10/01/2013  . Benign paroxysmal positional vertigo 10/01/2013  . Cancer [facility]) breast ca  right  . COPD (chronic obstructive pulmonary disease) ([place]) 10/01/2013  . Depression   . Diabetes mellitus type 2  . Emphysema   . Encephalopathy, hepatic ([place]) 06/07/2014  . Esophageal reflux 10/01/2013  . Fall 07/30/2016  . Hyperlipidemia   . Hyperlipidemia, mixed   . Increased ammonia level 11/25/2014  . NASH (nonalcoholic steatohepatitis) 08/26/2015  . Neck pain 10/01/2013  . Neuropathy    feet   . Overactive bladder 12/10/2013  . Panic attacks   . Pedal edema 12/10/2013  . Personal history of radiation therapy   . Preventative health care 03/08/2016  . Tobacco abuse disorder 02/01/2014    Patient Active Problem List   Diagnosis Date Noted  . Allergic state 11/10/2016  . 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 Portal hypertensive gastropathy ([place])   . Lower back injury, initial encounter 08/05/2016  . Fall 07/30/2016  . Preventative health care 03/08/2016  . Muscle spasm 02/25/2016  . Chronic respiratory failure ([place]) 08/29/2015  . NASH (nonalcoholic steatohepatitis) 08/26/2015  . Type 2 diabetes mellitus with hyperglycemia, with long-term current use of insulin ([place])   . 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 Diarrhea 12/30/2014  . Increased ammonia level 11/25/2014  . Diabetes mellitus type 2, controlled ([place]) 10/16/2014  . Superficial bruising 10/04/2014  . Encephalopathy, hepatic ([place]) 06/07/2014  . Right knee pain 06/07/2014  . Peripheral edema 03/30/2014  . Sun-damaged skin 02/01/2014  . Anxiety and depression 02/01/2014  . Tobacco abuse disorder 02/01/2014  . Medicare annual wellness visit, subsequent 02/01/2014  . Pedal edema 12/10/2013  . Overactive bladder 12/10/2013  . Abdominal aortic aneurysm ([place]) 10/01/2013  . Arthritis of both knees 10/01/2013  . Benign paroxysmal positional vertigo 10/01/2013  . Neck pain 10/01/2013  . Esophageal reflux 10/01/2013  . Thrombocytopenia ([place]) 03/17/2012  . 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   . Hyperlipidemia, mixed   . Breast cancer [facility])     Past Surgical History:  Procedure Laterality Date  . APPENDECTOMY  2007  . BREAST SURGERY  2009 right  . CATARACT EXTRACTION     x 2  . ESOPHAGOGASTRODUODENOSCOPY (EGD) WITH PROPOFOL N/A 08/14/2016   Procedure: ESOPHAGOGASTRODUODENOSCOPY (EGD) WITH PROPOFOL;  Surgeon: Mauri Pole, MD;  Location: [facility];  Service: Endoscopy;  Laterality: N/A;  . [place]  . KNEE SURGERY    . MANDIBLE FRACTURE SURGERY    . MASTECTOMY    . PILONIDAL CYST EXCISION    . TONSILLECTOMY      OB History    No data available        Home Medications    Prior to Admission medications   Medication Sig Start Date End Date Taking? Authorizing Provider  albuterol (ACCUNEB) 1.25 MG/3ML nebulizer solution Take 3 mLs (1.25 mg total) by nebulization every 6 (six) hours as needed for wheezing. 10/16/16   Mosie Lukes, MD  albuterol (PROVENTIL HFA;VENTOLIN HFA) 108 (90 Base) MCG/ACT inhaler Inhale 2 puffs into the lungs every 6 (six) hours as needed for wheezing or shortness of breath. Only dispense Ventolin 02/25/16   Mosie Lukes, MD  BD PEN NEEDLE NANO U/F 32G X 4 MM MISC USE AS DIRECTED WITH LEVEMIR FLEXPEN 08/17/16   Mosie Lukes, MD  Blood Glucose Monitoring Suppl (ONE TOUCH ULTRA 2) w/Device KIT Use as directed once daily to check blood sugar.  DX E11.9 09/14/16   Mosie Lukes, MD  cetirizine (ZYRTEC) 10 MG tablet Take 10 mg by mouth daily as needed for allergies.     Historical Provider, MD  Cholecalciferol (VITAMIN D3) 2000 units TABS Take 2,000 Units by mouth every morning.    Historical Provider, MD  fluticasone (FLONASE) 50 MCG/ACT nasal spray Place 2 sprays into both nostrils daily. 11/10/16   Mosie Lukes, MD  furosemide (LASIX) 20 MG tablet TAKE 1 TABLET BY MOUTH TWICE DAILY Patient taking differently: TAKE 1 TABLET BY MOUTH DAILY 08/14/16   Mauri Pole, MD  insulin aspart (NOVOLOG) 100 UNIT/ML injection 4 units SQ q lunch daily and prn Sliding scale:  BS <200 no units BS 201-250 use 2 units BS 251-300 use 4 units BS 301-350 use 6 units BS 351-400 use 8 units BS 401-450 use 10 units BS>451 call MD 02/25/16   Mosie Lukes, MD  Insulin Syringe-Needle U-100 31G X 5/16" 1 ML MISC To use w/ Novolog 07/24/16   Mosie Lukes, MD  lactulose (CHRONULAC) 10 GM/15ML solution TAKE 45 MLS BY MOUTH TWICE DAILY AS NEEDED FOR MILD CONSTIPATION. Patient taking differently: TAKE 45 MLS BY MOUTH DAILY AS NEEDED FOR MILD CONSTIPATION. 03/02/16   Mosie Lukes, MD  LEVEMIR FLEXTOUCH 100 UNIT/ML Pen INJECT 14 UNITS  UNDER THE SKIN EVERY DAY AT 10PM 08/31/16   Mosie Lukes, MD  montelukast (SINGULAIR) 10 MG tablet Take 1 tablet (10 mg total) by mouth daily as needed. 11/10/16   Mosie Lukes, MD  omeprazole (PRILOSEC) 20 MG capsule TAKE 1 CAPSULE(20 MG) BY MOUTH DAILY Patient taking differently: TAKE 1 CAPSULE(20 MG) BY MOUTH EVERY OTHER DAY 08/14/16   Mosie Lukes, MD  ONE TOUCH ULTRA TEST test strip USE TWICE DAILY TO CHECK BLOOD SUGAR AS DIRECTED 12/03/15   Mosie Lukes, MD  Polyvinyl Alcohol (LUBRICANT DROPS OP) Apply 1 drop to eye daily as needed (dry eyes).    Historical Provider, MD  rifaximin (XIFAXAN) 550 MG TABS tablet Take 1 tablet (550 mg total) by mouth 2 (two) times daily. 09/04/16   Amy S Ester[place], PA-C  sodium chloride (OCEAN) 0.65 % SOLN nasal spray Place 1 spray into both nostrils as needed for congestion. 08/01/15   Barton Dubois, MD  [place]  2.5 MCG/ACT AERS Inhale 2.5 mcg into the lungs daily as needed. Patient taking differently: Inhale 2 puffs into the lungs daily.  07/03/16   Rigoberto Noel, MD  spironolactone (ALDACTONE) 50 MG tablet TAKE 2 TABLETS(100 MG) BY MOUTH DAILY 11/23/16   Mauri Pole, MD  tiZANidine (ZANAFLEX) 4 MG tablet Take 1 tablet (4 mg total) by mouth every 8 (eight) hours as needed for muscle spasms. Patient taking differently: Take 2 mg by mouth at bedtime.  07/24/16   Mosie Lukes, MD  traMADol (ULTRAM-ER) 100 MG 24 hr tablet Take 1 tablet (100 mg total) by mouth daily as needed for pain. Patient not taking: Reported on 11/17/2016 08/14/16   Mauri Pole, MD    Family History Family History  Problem Relation Age of Onset  . Heart failure Father   . COPD Father   . Arthritis Father 65  . Stroke Mother   . Arthritis Mother 80  . Hyperlipidemia Mother   . Hypertension Mother   . Diabetes Mother   . Diabetes Sister   . Breast cancer    . Breast cancer Maternal Aunt   . Asthma Maternal Aunt   . Birth defects Maternal Aunt   . Alcohol abuse  Maternal Uncle   . Breast cancer Maternal Aunt     Social History Social History  Substance Use Topics  . Smoking status: Current Some Day Smoker    Packs/day: 0.25    Years: 58.00    Types: Cigarettes    Start date: 07/27/1964  . Smokeless tobacco: Never Used     Comment: 1 pack per week, tobacco infor given 12/30/15  . Alcohol use No     Allergies   Citalopram; Erythromycin; Glimepiride; Prednisone; and Versed [midazolam]   Review of Systems Review of Systems  Constitutional: Positive for chills. Negative for diaphoresis, fatigue and fever.  HENT: Negative for congestion and rhinorrhea.   Respiratory: Negative for chest tightness, shortness of breath, wheezing and stridor.   Cardiovascular: Negative for chest pain and palpitations.  Gastrointestinal: Negative for abdominal pain, constipation, diarrhea, nausea and vomiting.  Genitourinary: Positive for frequency. Negative for dysuria, hematuria and hesitancy.  Musculoskeletal: Negative for back pain and neck pain.  Skin: Negative for rash and wound.  Neurological: Negative for weakness, light-headedness and numbness.  Psychiatric/Behavioral: Positive for confusion. Negative for agitation, hallucinations and self-injury.  All other systems reviewed and are negative.    Physical Exam Updated Vital Signs BP 139/62 (BP Location: Right Arm)   Pulse 88   Temp 98.1 F (36.7 C) (Oral)   Resp 18   Ht '5\' 7"'  (1.702 m)   Wt 148 lb (67.1 kg)   SpO2 96%   BMI 23.18 kg/m   Physical Exam  Constitutional: She appears well-developed and well-nourished. No distress.  HENT:  Head: Normocephalic and atraumatic.  Right Ear: External ear normal.  Left Ear: External ear normal.  Nose: Nose normal.  Mouth/Throat: Oropharynx is clear and moist. No oropharyngeal exudate.  Eyes: Conjunctivae and EOM are normal. Pupils are equal, round, and reactive to light.  Neck: Normal range of motion. Neck supple.  Cardiovascular: Normal rate,  normal heart sounds and intact distal pulses.   No murmur heard. Pulmonary/Chest: Effort normal and breath sounds normal. No stridor. No respiratory distress. She has no wheezes. She exhibits no tenderness.  Abdominal: Soft. She exhibits no distension. There is no tenderness. There is no rebound.  Neurological: She is alert. She has normal reflexes. She  is disoriented. She displays no tremor. No cranial nerve deficit or sensory deficit. She exhibits normal muscle tone. Coordination normal.  Skin: Skin is warm. Capillary refill takes less than 2 seconds. No rash noted. She is not diaphoretic. No erythema.  Psychiatric: She has a normal mood and affect.  Nursing note and vitals reviewed.    ED Treatments / Results  Labs (all labs ordered are listed, but only abnormal results are displayed) Labs Reviewed  COMPREHENSIVE METABOLIC PANEL - Abnormal; Notable for the following:       Result Value   Glucose, Bld 121 (*)    BUN 21 (*)    Total Protein 5.9 (*)    Albumin 2.9 (*)    AST 47 (*)    Alkaline Phosphatase 145 (*)    Total Bilirubin 3.2 (*)    All other components within normal limits  CBC - Abnormal; Notable for the following:    RDW 15.8 (*)    Platelets 128 (*)    All other components within normal limits  AMMONIA - Abnormal; Notable for the following:    Ammonia 38 (*)    All other components within normal limits  URINALYSIS, ROUTINE W REFLEX MICROSCOPIC - Abnormal; Notable for the following:    Color, Urine AMBER (*)    APPearance TURBID (*)    Leukocytes, UA TRACE (*)    All other components within normal limits  URINALYSIS, MICROSCOPIC (REFLEX) - Abnormal; Notable for the following:    Bacteria, UA FEW (*)    Squamous Epithelial / LPF 6-30 (*)    All other components within normal limits  I-STAT CG4 LACTIC ACID, ED - Abnormal; Notable for the following:    Lactic Acid, Venous 2.49 (*)    All other components within normal limits  URINE CULTURE  I-STAT CG4 LACTIC  ACID, ED    EKG  EKG Interpretation  Date/Time:  Wednesday Nov 25 2016 11:57:00 EDT Ventricular Rate:  88 PR Interval:    QRS Duration: 91 QT Interval:  373 QTC Calculation: 452 R Axis:   78 Text Interpretation:  Sinus rhythm Biatrial enlargement When compared to prior, no significant changes seen,  No STEMI Confirmed by Sherry Ruffing MD, CHRISTOPHER [phone]) on 11/25/2016 4:56:01 PM       Radiology Dg Chest 2 View  Result Date: 11/25/2016 CLINICAL DATA:  Altered mental status, patient reports right anterior chest pain for some time EXAM: CHEST  2 VIEW COMPARISON:  Chest x-ray dated September 06, 2016 FINDINGS: The lungs are mildly hyperinflated. The interstitial markings are coarse though stable. The heart and pulmonary vascularity are normal. The mediastinum is normal in width. There is calcification in the wall of the aortic arch. There is no pleural effusion. There is mild multilevel degenerative disc disease of the thoracic spine. IMPRESSION: Chronic bronchitic changes, stable. No pneumonia, CHF, nor other acute cardiopulmonary abnormality. Thoracic aortic atherosclerosis. Electronically Signed   By: David  Martinique M.D.   On: 11/25/2016 12:06   Ct Head Wo Contrast  Result Date: 11/25/2016 CLINICAL DATA:  Confusion since yesterday. EXAM: CT HEAD WITHOUT CONTRAST TECHNIQUE: Contiguous axial images were obtained from the base of the skull through the vertex without intravenous contrast. COMPARISON:  Brain MRI 0 3 scratch the brain MRI 10/16/2014. Head CT scan 10/15/2014. FINDINGS: Brain: There is some cortical atrophy. Cavum septum pellucidum et vergae noted. No evidence of acute abnormality including hemorrhage, infarct, mass lesion, mass effect, midline shift or abnormal extra-axial fluid collection. No hydrocephalus or pneumocephalus.  Vascular: Atherosclerosis noted. Skull: Intact. Sinuses/Orbits: Negative. Other: Partial visualization of attempted screw fixation of the left mandibular fracture  noted. IMPRESSION: No acute abnormality. Atrophy. Electronically Signed   By: Inge Rise M.D.   On: 11/25/2016 15:15    Procedures Procedures (including critical care time)  Medications Ordered in ED Medications  ipratropium-albuterol (DUONEB) 0.5-2.5 (3) MG/3ML nebulizer solution 3 mL (3 mLs Nebulization Given 11/25/16 1202)  cefTRIAXone (ROCEPHIN) 1 g in dextrose 5 % 50 mL IVPB (0 g Intravenous Stopped 11/25/16 1646)  sodium chloride 0.9 % bolus 1,000 mL (1,000 mLs Intravenous New Bag/Given 11/25/16 1609)     Initial Impression / Assessment and Plan / ED Course  I have reviewed the triage vital signs and the nursing notes.  Pertinent labs & imaging results that were available during my care of the patient were reviewed by me and considered in my medical decision making (see chart for details).     Ashley Savage is a 76 y.o. female with a past medical history significant for diabetes, COPD, cancer, prior hepatic encephalopathy and cirrhosis who presents with confusion, frequency, and incontinence of urine. Patient  says that she was feeling somewhat confused yesterday and took some lactulose. He says that this morning, she was feeling even more confused and had a difficult time putting her shirt on. According to nursing, patient was wearing a T-shirt as pants on arrival. Patient says that she was incontinent of urine and this is new for her. She does report some urinary frequency but no burning. She denies any fevers but does report some chills. She denies any chest pain, shortness breath, cough, or abdominal pain. She denies chest pain. She denies any neurologic deficits.  Patient lives alone.   History and exam are seen above.  On exam, patient is alert and oriented 2. Patient has no abdominal tenderness or back tenderness. Patient's lungs were clear on exam. Patient had no focal neurologic deficits.  Patient had workup to look for hepatic encephalopathy or other causes of infection.  CT of the head was ordered and showed no acute abnormalities. Chest x-ray shows no pneumonia. Urinalysis significant for UTI.  Initially, discussion was held patient about going home however, due to worsened confusion, decision was made to give patient IV antibiotics and admit the patient for further management of urinary tract infection and associated altered mental status. Patient had no neck stiffness and neck pain, do not feel patient has meningitis. Patient's lactic acid was elevated similar to prior. Patient's ammonia was greatly improved from prior.  Hospitalist team called at [facility] and patient will be admitted for further management.       Final Clinical Impressions(s) / ED Diagnoses   Final diagnoses:  Acute cystitis without hematuria  Confusion  Altered mental status, unspecified altered mental status type    Clinical Impression: 1. Acute cystitis without hematuria   2. Confusion   3. Altered mental status, unspecified altered mental status type     Disposition: Admit to Hospitalist service      Courtney Paris, MD 11/25/16 [phone]

## 2016-11-25 NOTE — Progress Notes (Signed)
Report received from [facility] from [facility], awaiting patients arrival via [place].

## 2016-11-25 NOTE — Progress Notes (Signed)
Called by Dr. Gustavus Messing regarding Ashley Savage, with history of Ashley Savage, hyperlipidemia, thrombocytopenia, prior history of hepatic encephalopathy, who presented to [facility] with confusion.  It appears that she has a urinary tract infection, ammonia is not as elevated as it was in the past so unlikely hepatic encephalopathy.  She received IV antibiotics with ceftriaxone, however has persistent slight confusion and she was directed for admission.  CT scan of the head is normal.  No suspicion for stroke per EDP.  Accepted to [place]  RN, on patient arrival, please call [phone] and let patient placement RN know of the patient's arrival and a hospitalist will be assigned to admit the patient.   Costin M. Cruzita Lederer, MD [facility] [phone]  If 7PM-7AM, please contact night-coverage [email or web address] Password [facility]  09/04/2016, 5:37 AM

## 2016-11-25 NOTE — ED Notes (Signed)
MD to the bedside  - assessing patients mental status. Patient is now crying uncontrollably and wants to see her cat. Patient repeats frequently that she wants to go home.

## 2016-11-26 ENCOUNTER — Ambulatory Visit: Payer: PPO | Admitting: Family Medicine

## 2016-11-26 DIAGNOSIS — Z794 Long term (current) use of insulin: Secondary | ICD-10-CM | POA: Diagnosis not present

## 2016-11-26 DIAGNOSIS — N390 Urinary tract infection, site not specified: Secondary | ICD-10-CM | POA: Diagnosis not present

## 2016-11-26 DIAGNOSIS — K7581 Nonalcoholic steatohepatitis (NASH): Secondary | ICD-10-CM | POA: Diagnosis not present

## 2016-11-26 DIAGNOSIS — G9340 Encephalopathy, unspecified: Secondary | ICD-10-CM | POA: Diagnosis not present

## 2016-11-26 DIAGNOSIS — K7460 Unspecified cirrhosis of liver: Secondary | ICD-10-CM | POA: Diagnosis not present

## 2016-11-26 DIAGNOSIS — J449 Chronic obstructive pulmonary disease, unspecified: Secondary | ICD-10-CM

## 2016-11-26 DIAGNOSIS — D696 Thrombocytopenia, unspecified: Secondary | ICD-10-CM

## 2016-11-26 DIAGNOSIS — N3000 Acute cystitis without hematuria: Secondary | ICD-10-CM

## 2016-11-26 DIAGNOSIS — E1142 Type 2 diabetes mellitus with diabetic polyneuropathy: Secondary | ICD-10-CM

## 2016-11-26 LAB — CBC
HEMATOCRIT: 36.6 % (ref 36.0–46.0)
HEMOGLOBIN: 12.6 g/dL (ref 12.0–15.0)
MCH: 32.5 pg (ref 26.0–34.0)
MCHC: 34.4 g/dL (ref 30.0–36.0)
MCV: 94.3 fL (ref 78.0–100.0)
Platelets: 115 10*3/uL — ABNORMAL LOW (ref 150–400)
RBC: 3.88 MIL/uL (ref 3.87–5.11)
RDW: 16.2 % — ABNORMAL HIGH (ref 11.5–15.5)
WBC: 6.3 10*3/uL (ref 4.0–10.5)

## 2016-11-26 LAB — LACTIC ACID, PLASMA: Lactic Acid, Venous: 2.3 mmol/L (ref 0.5–1.9)

## 2016-11-26 LAB — URINE CULTURE

## 2016-11-26 LAB — GLUCOSE, CAPILLARY
GLUCOSE-CAPILLARY: 84 mg/dL (ref 65–99)
Glucose-Capillary: 135 mg/dL — ABNORMAL HIGH (ref 65–99)

## 2016-11-26 LAB — BASIC METABOLIC PANEL
Anion gap: 7 (ref 5–15)
BUN: 18 mg/dL (ref 6–20)
CO2: 22 mmol/L (ref 22–32)
CREATININE: 0.8 mg/dL (ref 0.44–1.00)
Calcium: 8.4 mg/dL — ABNORMAL LOW (ref 8.9–10.3)
Chloride: 111 mmol/L (ref 101–111)
GFR calc Af Amer: >60 mL/min (ref >60)
GFR calc non Af Amer: >60 mL/min (ref >60)
GLUCOSE: 109 mg/dL — AB (ref 65–99)
Potassium: 4.3 mmol/L (ref 3.5–5.1)
Sodium: 140 mmol/L (ref 135–145)

## 2016-11-26 LAB — AMMONIA: Ammonia: 92 umol/L — ABNORMAL HIGH (ref 9–35)

## 2016-11-26 MED ORDER — CEFPODOXIME PROXETIL 200 MG PO TABS: 200.0000 mg | ORAL_TABLET | Freq: Two times a day (BID) | ORAL | 0 refills | Status: DC

## 2016-11-26 MED ORDER — SODIUM CHLORIDE 0.9 % IV BOLUS (SEPSIS)
500.0000 mL | Freq: Once | INTRAVENOUS | Status: AC
  Administered 2016-11-26: 500 mL via INTRAVENOUS

## 2016-11-26 NOTE — Discharge Instructions (Signed)

## 2016-11-26 NOTE — Progress Notes (Signed)
CRITICAL VALUE ALERT  Critical value received:  Lactic acid 2.3  Date of notification:  11/26/2016   Time of notification:  0959  Critical value read back: yes  Nurse who received alert:  Maylon Cos, RN  MD notified (1st page):  Dr. Wynelle Cleveland   Time of first page:  Verbally informed MD @ 1005  MD notified (2nd page):  Time of second page:  Responding MD:    Time MD responded:

## 2016-11-26 NOTE — Progress Notes (Signed)
Nutrition Brief Note  Patient identified on the Malnutrition Screening Tool (MST) Report  Patient in room with desire to go home and check on her pets. Pt states she eats well and receives meals through [employer] [facility] [place]. Pt states she tries to eat what she is supposed to, she is aware of what protein and carbohydrate foods are. She has had nutrition education in the past with several other RDs. Pt expresses no interest in nutrition interventions at this time. PO intakes: 100% of meals  Wt Readings from Last 15 Encounters:  11/25/16 148 lb (67.1 kg)  11/16/16 148 lb (67.1 kg)  11/10/16 148 lb (67.1 kg)  10/22/16 148 lb 3.2 oz (67.2 kg)  10/13/16 150 lb (68 kg)  09/28/16 148 lb (67.1 kg)  09/15/16 150 lb (68 kg)  09/06/16 156 lb (70.8 kg)  08/14/16 156 lb (70.8 kg)  08/04/16 156 lb (70.8 kg)  07/24/16 159 lb 6.4 oz (72.3 kg)  07/16/16 160 lb 4.4 oz (72.7 kg)  07/08/16 156 lb 6 oz (70.9 kg)  07/07/16 154 lb 8.7 oz (70.1 kg)  07/02/16 157 lb 3 oz (71.3 kg)    Body mass index is 23.18 kg/m. Patient meets criteria for normal based on current BMI.   Current diet order is CHO modified, patient is consuming approximately 100% of meals at this time. Labs and medications reviewed.   No nutrition interventions warranted at this time. If nutrition issues arise, please consult RD.   Clayton Bibles, MS, RD, LDN Pager: [phone] After Hours Pager: [phone]

## 2016-11-26 NOTE — Discharge Summary (Signed)
Physician Discharge Summary  Ashley Savage XBD:[id number] DOB: [birth year] DOA: 11/25/2016  PCP: Penni Homans, MD  Admit date: 11/25/2016 Discharge date: 11/26/2016  Admitted From: home  Disposition:  home   Recommendations for Outpatient Follow-up:  1. 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   Thrombocytopenia ([place])   Diabetes mellitus type 2, controlled ([place])   Liver cirrhosis secondary to NASH [facility])    Subjective: Tells me she no longer if feeling confused or forgetful. Has a chronic cough and is wheezing on exam but not short of breath. No dysuria, urgency or fever.  Brief Summary: Ashley Savage is a 76 y.o. female with medical history significant of NASH, prior hepatic encephalopathy, COPD, DM 2 who came in to the ER because she was feeling confused and forgetful. She felt her Ammonia may be high and came into the ER.  In the ER she was wearing her shirt at pants. She has been taking her Lactulose appropriately and having at least 2 BMs a day. Ammonia level was not elevated but UA was mildly positive.   Hospital Course:  Acute encephalopathy - resolved today- she feels back to normal - CT head negative - she admits to being confused with a UTI in the past  - no other source for confusion found therefore, at this time will continue to treat for a UTI - she has received Rocephin- will d/c home on Vantin  Mild lactic acidosis - possibly due to cirrhosis- has not improved much despite fluid bolus and continued IVF overnight  - BP 109/42 today- she states her BP is always low- after 500 cc bolus today, BP has not improved - it is 110/ 50 - both were checked manually- she is mentating well today - she is urinating well and is so not dehydrated- OK to resume  Aldactone tomorrow and cont oupt f/u  NASH with Cirrhosis - cont home meds which she states she is compliant with  Thrombocytopenia chronic - likely from Cirrhosis  COPD - stable  DM2 - cont home doses of Insulin   Discharge Instructions  Discharge Instructions    Diet - low sodium heart healthy    Complete by:  As directed    Diet Carb Modified    Complete by:  As directed    Increase activity slowly    Complete by:  As directed      Allergies as of 11/26/2016      Reactions   Citalopram Palpitations   Irregular heart beat   Erythromycin Other (See Comments)   Stomach cramps   Glimepiride    Elevated ammonia levels   Prednisone    Increased blood sugars too high   Versed [midazolam] Other (See Comments)   Patient stayed confusion stayed 4+days       Medication List    TAKE these medications   albuterol 108 (90 Base) MCG/ACT inhaler Commonly known as:  PROVENTIL HFA;VENTOLIN HFA Inhale 2 puffs into the lungs every 6 (six) hours as needed for wheezing or shortness of breath. Only dispense Ventolin   albuterol 1.25 MG/3ML nebulizer solution Commonly known as:  ACCUNEB Take 3 mLs (1.25 mg total) by nebulization every 6 (six) hours as needed for wheezing.   BD PEN NEEDLE NANO U/F  32G X 4 MM Misc Generic drug:  Insulin Pen Needle USE AS DIRECTED WITH LEVEMIR FLEXPEN   cefpodoxime 200 MG tablet Commonly known as:  VANTIN Take 1 tablet (200 mg total) by mouth 2 (two) times daily.   cetirizine 10 MG tablet Commonly known as:  ZYRTEC Take 10 mg by mouth daily as needed for allergies.   fluticasone 50 MCG/ACT nasal spray Commonly known as:  FLONASE Place 2 sprays into both nostrils daily. What changed:  when to take this  reasons to take this   furosemide 20 MG tablet Commonly known as:  LASIX TAKE 1 TABLET BY MOUTH TWICE DAILY What changed:  See the new instructions.   Insulin Syringe-Needle U-100 31G X 5/16" 1 ML Misc To use w/ Novolog   lactulose  10 GM/15ML solution Commonly known as:  CHRONULAC TAKE 45 MLS BY MOUTH TWICE DAILY AS NEEDED FOR MILD CONSTIPATION.   LEVEMIR FLEXTOUCH 100 UNIT/ML Pen Generic drug:  Insulin Detemir INJECT 14 UNITS UNDER THE SKIN EVERY DAY AT 10PM   LUBRICANT DROPS OP Apply 1 drop to eye daily as needed (dry eyes).   montelukast 10 MG tablet Commonly known as:  SINGULAIR Take 1 tablet (10 mg total) by mouth daily as needed. What changed:  reasons to take this   ONE TOUCH ULTRA 2 w/Device Kit Use as directed once daily to check blood sugar.  DX E11.9   ONE TOUCH ULTRA TEST test strip Generic drug:  glucose blood USE TWICE DAILY TO CHECK BLOOD SUGAR AS DIRECTED   rifaximin 550 MG Tabs tablet Commonly known as:  XIFAXAN Take 1 tablet (550 mg total) by mouth 2 (two) times daily. What changed:  when to take this  reasons to take this   sodium chloride 0.65 % Soln nasal spray Commonly known as:  OCEAN Place 1 spray into both nostrils as needed for congestion.   spironolactone 50 MG tablet Commonly known as:  ALDACTONE TAKE 2 TABLETS(100 MG) BY MOUTH DAILY What changed:  See the new instructions.   tiZANidine 4 MG tablet Commonly known as:  ZANAFLEX Take 1 tablet (4 mg total) by mouth every 8 (eight) hours as needed for muscle spasms. What changed:  how much to take  when to take this   Vitamin D3 2000 units Tabs Take 2,000 Units by mouth every morning.      Follow-up Information    Saguier, Percell Miller, PA-C Follow up on 12/02/2016.   Specialties:  Internal Medicine, Family Medicine Why:  Appointment for 1:30 pm Contact information: [address] [place] [address] [phone]          Allergies  Allergen Reactions  . Citalopram Palpitations    Irregular heart beat  . Erythromycin Other (See Comments)    Stomach cramps  . Glimepiride     Elevated ammonia levels  . Prednisone     Increased blood sugars too high  . Versed [Midazolam] Other (See  Comments)    Patient stayed confusion stayed 4+days      Procedures/Studies:  Dg Chest 2 View  Result Date: 11/25/2016 CLINICAL DATA:  Altered mental status, patient reports right anterior chest pain for some time EXAM: CHEST  2 VIEW COMPARISON:  Chest x-ray dated September 06, 2016 FINDINGS: The lungs are mildly hyperinflated. The interstitial markings are coarse though stable. The heart and pulmonary vascularity are normal. The mediastinum is normal in width. There is calcification in the wall of the aortic arch. There is  no pleural effusion. There is mild multilevel degenerative disc disease of the thoracic spine. IMPRESSION: Chronic bronchitic changes, stable. No pneumonia, CHF, nor other acute cardiopulmonary abnormality. Thoracic aortic atherosclerosis. Electronically Signed   By: David  Martinique M.D.   On: 11/25/2016 12:06   Ct Head Wo Contrast  Result Date: 11/25/2016 CLINICAL DATA:  Confusion since yesterday. EXAM: CT HEAD WITHOUT CONTRAST TECHNIQUE: Contiguous axial images were obtained from the base of the skull through the vertex without intravenous contrast. COMPARISON:  Brain MRI 0 3 scratch the brain MRI 10/16/2014. Head CT scan 10/15/2014. FINDINGS: Brain: There is some cortical atrophy. Cavum septum pellucidum et vergae noted. No evidence of acute abnormality including hemorrhage, infarct, mass lesion, mass effect, midline shift or abnormal extra-axial fluid collection. No hydrocephalus or pneumocephalus. Vascular: Atherosclerosis noted. Skull: Intact. Sinuses/Orbits: Negative. Other: Partial visualization of attempted screw fixation of the left mandibular fracture noted. IMPRESSION: No acute abnormality. Atrophy. Electronically Signed   By: Inge Rise M.D.   On: 11/25/2016 15:15   US Carotid Duplex Bilateral  Result Date: 11/15/2016 CLINICAL DATA:  76 year old female with a history of carotid stenosis. Cardiovascular risk factors include hyperlipidemia, diabetes, tobacco use,  EXAM: BILATERAL CAROTID DUPLEX ULTRASOUND TECHNIQUE: Pearline Cables scale imaging, color Doppler and duplex ultrasound were performed of bilateral carotid and vertebral arteries in the neck. COMPARISON:  Report only from 06/25/2014 FINDINGS: Criteria: Quantification of carotid stenosis is based on velocity parameters that correlate the residual internal carotid diameter with NASCET-based stenosis levels, using the diameter of the distal internal carotid lumen as the denominator for stenosis measurement. The following velocity measurements were obtained: RIGHT ICA:  Systolic 937 cm/sec, Diastolic 31 cm/sec CCA:  87 cm/sec SYSTOLIC ICA/CCA RATIO:  2.0 ECA:  162 cm/sec LEFT ICA:  Systolic 902 cm/sec, Diastolic 28 cm/sec CCA:  95 cm/sec SYSTOLIC ICA/CCA RATIO:  4.09 ECA:  273 cm/sec Right Brachial SBP: Not acquired Left Brachial SBP: 121 RIGHT CAROTID ARTERY: No significant calcifications of the right common carotid artery. Intermediate waveform maintained. Heterogeneous and partially calcified plaque at the right carotid bifurcation. No significant lumen shadowing. Low resistance waveform of the right ICA. Mild tortuosity RIGHT VERTEBRAL ARTERY: Antegrade flow with low resistance waveform. LEFT CAROTID ARTERY: No significant calcifications of the left common carotid artery. Intermediate waveform maintained. Heterogeneous and partially calcified plaque at the left carotid bifurcation without significant lumen shadowing. Low resistance waveform of the left ICA. Mild tortuosity LEFT VERTEBRAL ARTERY:  Antegrade flow with low resistance waveform. IMPRESSION: Right: Heterogeneous and partially calcified plaque at the right carotid bifurcation contributes to 50%- 69% stenosis by established duplex criteria. Left: Heterogeneous and partially calcified plaque at the carotid bifurcation, with discordant results regarding degree of stenosis by established duplex criteria. Peak velocity suggests 50%- 69% stenosis, with the ICA/ CCA ratio  suggesting a lesser degree of stenosis. If establishing a more accurate degree of stenosis is required, cerebral angiogram should be considered, or as a second best test, CTA. Signed, Dulcy Fanny. Earleen Newport, DO [facility] Electronically Signed   By: Corrie Mckusick D.O.   On: 11/15/2016 08:44   Dg Bone Density (dxa)  Result Date: 11/14/2016 EXAM: DUAL X-RAY ABSORPTIOMETRY (DXA) FOR BONE MINERAL DENSITY IMPRESSION: Referring Physician:  Mosie Lukes PATIENT: Name: Amarachi, Kotz Patient ID: [id number] Birth Date: February 24, 1941 Height: 67.0 in. Sex: Female Measured: 11/14/2016 Weight: 148.0 lbs. Indications: Caucasian, Diabetic, Post Menopausal Fractures: None Treatments: Vitamin D ASSESSMENT: The BMD measured at Femur Neck Left is 0.814  g/cm2 with a T-score of -1.6. This patient is considered osteopenic according to [facility]) criteria. The BMD measured at Femur Neck Right is 0.816 g/cm2 with a T-score of -1.6. This patient is considered osteopenic according to [facility]) criteria. Site Region Measured Date Measured Age WHO YA BMD Classification T-score AP Spine L1-L4 11/14/[age] Normal -0.3 1.142 g/cm2 DualFemur Neck Left 13/14/[age] Osteopenia -1.6 0.814 g/cm2 World Health Organization [facility]) criteria for post-menopausal, Caucasian Women: Normal       T-score at or above -1 SD Osteopenia   T-score between -1 and -2.5 SD Osteoporosis T-score at or below -2.5 SD RECOMMENDATION: [place] recommends that FDA-approved medical therapies be considered in postmenopausal women and men age 47 or older with a: 1. Hip or vertebral (clinical or morphometric) fracture. 2. T-score of < -2.5 at the spine or hip. 3. Ten-year fracture probability by FRAX of 3% or greater for hip fracture or 20% or greater for major osteoporotic fracture. All treatment decisions require clinical judgment and consideration of  individual patient factors, including patient preferences, co-morbidities, previous drug use, risk factors not captured in the FRAX model (e.g. falls, vitamin D deficiency, increased bone turnover, interval significant decline in bone density) and possible under - or over-estimation of fracture risk by FRAX. All patients should ensure an adequate intake of dietary calcium (1200 mg/d) and vitamin D (800 IU daily) unless contraindicated. FOLLOW-UP: People with diagnosed cases of osteoporosis or at high risk for fracture should have regular bone mineral density tests. For patients eligible for Medicare, routine testing is allowed once every 2 years. The testing frequency can be increased to one year for patients who have rapidly progressing disease, those who are receiving or discontinuing medical therapy to restore bone mass, or have additional risk factors. I have reviewed this report and agree with the above findings. [facility] Radiology Electronically Signed   By: Lajean Manes M.D.   On: 11/14/2016 15:01       Discharge Exam: Vitals:   11/26/16 1333 11/26/16 1452  BP: (!) 110/50 100/66  Pulse:  72  Resp:  20  Temp:     Vitals:   11/26/16 1112 11/26/16 1114 11/26/16 1333 11/26/16 1452  BP: (!) 109/42  (!) 110/50 100/66  Pulse: 72   72  Resp:    20  Temp:      TempSrc:      SpO2: 99% 95%  97%  Weight:      Height:        General: Pt is alert, awake, not in acute distress Cardiovascular: RRR, S1/S2 +, no rubs, no gallops Respiratory:  Mild wheezing this morning- improve with Neb treatment (she takes a treatment every morning at home), no rhonchi Abdominal: Soft, NT, ND, bowel sounds + Extremities: no edema, no cyanosis    The results of significant diagnostics from this hospitalization (including imaging, microbiology, ancillary and laboratory) are listed below for reference.     Microbiology: Recent Results (from the past 240 hour(s))  Urine culture     Status: Abnormal    Collection Time: 11/25/16  3:20 PM  Result Value Ref Range Status   Specimen Description URINE, RANDOM  Final   Special Requests NONE  Final   Culture MULTIPLE SPECIES PRESENT, SUGGEST RECOLLECTION (A)  Final   Report Status 11/26/2016 FINAL  Final     Labs: BNP (last 3 results) No results for input(s): BNP in the last 8760 hours. Basic Metabolic  Panel:  Recent Labs Lab 11/25/16 1132 11/26/16 0635  NA 138 140  K 4.6 4.3  CL 105 111  CO2 24 22  GLUCOSE 121* 109*  BUN 21* 18  CREATININE 0.91 0.80  CALCIUM 9.2 8.4*   Liver Function Tests:  Recent Labs Lab 11/25/16 1132  AST 47*  ALT 32  ALKPHOS 145*  BILITOT 3.2*  PROT 5.9*  ALBUMIN 2.9*   No results for input(s): LIPASE, AMYLASE in the last 168 hours.  Recent Labs Lab 11/25/16 1132 11/26/16 0635  AMMONIA 38* 92*   CBC:  Recent Labs Lab 11/25/16 1132 11/26/16 0635  WBC 6.2 6.3  HGB 14.7 12.6  HCT 41.5 36.6  MCV 93.5 94.3  PLT 128* 115*   Cardiac Enzymes: No results for input(s): CKTOTAL, CKMB, CKMBINDEX, TROPONINI in the last 168 hours. BNP: Invalid input(s): POCBNP CBG:  Recent Labs Lab 11/26/16 0732 11/26/16 1155  GLUCAP 84 135*   D-Dimer No results for input(s): DDIMER in the last 72 hours. Hgb A1c No results for input(s): HGBA1C in the last 72 hours. Lipid Profile No results for input(s): CHOL, HDL, LDLCALC, TRIG, CHOLHDL, LDLDIRECT in the last 72 hours. Thyroid function studies No results for input(s): TSH, T4TOTAL, T3FREE, THYROIDAB in the last 72 hours.  Invalid input(s): FREET3 Anemia work up No results for input(s): VITAMINB12, FOLATE, FERRITIN, TIBC, IRON, RETICCTPCT in the last 72 hours. Urinalysis    Component Value Date/Time   COLORURINE AMBER (A) 11/25/2016 1520   APPEARANCEUR TURBID (A) 11/25/2016 1520   LABSPEC 1.022 11/25/2016 1520   PHURINE 6.0 11/25/2016 1520   GLUCOSEU NEGATIVE 11/25/2016 1520   GLUCOSEU NEGATIVE 03/26/2015 1014   HGBUR NEGATIVE 11/25/2016  [place] 11/25/2016 1520   BILIRUBINUR 4 03/18/2015 1432   KETONESUR NEGATIVE 11/25/2016 1520   PROTEINUR NEGATIVE 11/25/2016 1520   UROBILINOGEN 1.0 03/26/2015 1014   NITRITE NEGATIVE 11/25/2016 1520   LEUKOCYTESUR TRACE (A) 11/25/2016 1520   Sepsis Labs Invalid input(s): PROCALCITONIN,  WBC,  LACTICIDVEN Microbiology Recent Results (from the past 240 hour(s))  Urine culture     Status: Abnormal   Collection Time: 11/25/16  3:20 PM  Result Value Ref Range Status   Specimen Description URINE, RANDOM  Final   Special Requests NONE  Final   Culture MULTIPLE SPECIES PRESENT, SUGGEST RECOLLECTION (A)  Final   Report Status 11/26/2016 FINAL  Final     Time coordinating discharge: Over 30 minutes  SIGNED:   Debbe Odea, MD  [facility] 11/26/2016, 3:47 PM Pager   If 7PM-7AM, please contact night-coverage [email or web address] Password [email or web address]

## 2016-11-26 NOTE — Care Management Note (Signed)
Case Management Note  Patient Details  Name: LEYLANY NORED MRN: [id number] Date of Birth: 1941/05/30  Subjective/Objective:       ams/urine turbid/question uti             Action/Plan:  Lives alone no family Date:  Nov 26, 2016 Chart reviewed for concurrent status and case management needs. Will continue to follow patient progress. Discharge Planning: following for needs Expected discharge date: [id number] [name], BSN, [place], [place]  Expected Discharge Date:                  Expected Discharge Plan:  Home/Self Care  In-House Referral:     Discharge planning Services     Post Acute Care Choice:    Choice offered to:     DME Arranged:    DME Agency:     HH Arranged:    [place] Agency:     Status of Service:  In process, will continue to follow  If discussed at Long Length of Stay Meetings, dates discussed:    Additional Comments:  [name], RN 11/26/2016, 8:13 AM

## 2016-11-27 ENCOUNTER — Encounter: Payer: Self-pay | Admitting: Family Medicine

## 2016-11-27 ENCOUNTER — Telehealth: Payer: Self-pay | Admitting: Behavioral Health

## 2016-11-27 ENCOUNTER — Ambulatory Visit (INDEPENDENT_AMBULATORY_CARE_PROVIDER_SITE_OTHER): Payer: PPO | Admitting: Family Medicine

## 2016-11-27 VITALS — BP 134/71 | HR 84 | Ht 67.0 in | Wt 148.0 lb

## 2016-11-27 DIAGNOSIS — M1711 Unilateral primary osteoarthritis, right knee: Secondary | ICD-10-CM

## 2016-11-27 MED ORDER — SODIUM HYALURONATE (VISCOSUP) 25 MG/2.5ML IX SOSY
2.5000 mL | PREFILLED_SYRINGE | Freq: Once | INTRA_ARTICULAR | Status: AC
  Administered 2016-11-27: 2.5 mL via INTRA_ARTICULAR

## 2016-11-27 NOTE — Telephone Encounter (Signed)
Attempted to reach patient for TCM/Hospital Follow-up call. Left message for patient to return call when available.    

## 2016-11-29 NOTE — Assessment & Plan Note (Signed)
s/p cortisone injection.  Repeating viscosupplementation starting today.  Topical medications.  Home exercises.   Heat/ice.  Continue use of cane or walker.   After informed written consent, patient was seated on exam table. Right knee was prepped with alcohol swab and utilizing anteromedial approach, patient's right knee was injected intraarticularly with 2mL bupivicaine followed by supartz. Patient tolerated the procedure well without immediate complications.

## 2016-11-29 NOTE — Progress Notes (Signed)
PCP: Mosie Lukes, MD  Subjective:   HPI: Patient is a 76 y.o. female here for right knee pain.  2/20: Patient reports she is overall doing well with PT for her hip. However, started to get pain over past few weeks in anterior right knee. Worse on lateral side. Has history of meniscectomy here remotely. Pain is 8/10 and sharp. Worse with walking. Using a walker. Tried flexogenix without benefit. Last cortisone shot by outside physician was early last year. No skin changes, numbness.  3/20: Patient reports she feels improved compared to last visit. Doing well with physical therapy and home exercises. Pain level is 4/10. Injection helped a lot first 3 weeks. No swelling. No skin changes, numbness.  4/23: Patient reports she's still having right knee pain. Pain level is 5/10, worse with prolonged standing, twisting, if cat hits her knee just right, can be sharp. Pain is anterior. No new injuries. Using a cane to help get around. No skin changes, numbness.  5/4: Patient reports her pain is 5/10 level. No new skin changes, numbness.  Past Medical History:  Diagnosis Date  . Allergic state 11/10/2016  . Anxiety   . Arthritis of both knees 10/01/2013  . Benign paroxysmal positional vertigo 10/01/2013  . Cancer [facility]) breast ca  right  . COPD (chronic obstructive pulmonary disease) ([place]) 10/01/2013  . Depression   . Diabetes mellitus type 2  . Emphysema   . Encephalopathy, hepatic ([place]) 06/07/2014  . Esophageal reflux 10/01/2013  . Fall 07/30/2016  . Hyperlipidemia   . Hyperlipidemia, mixed   . Increased ammonia level 11/25/2014  . NASH (nonalcoholic steatohepatitis) 08/26/2015  . Neck pain 10/01/2013  . Neuropathy    feet   . Overactive bladder 12/10/2013  . Panic attacks   . Pedal edema 12/10/2013  . Personal history of radiation therapy   . Preventative health care 03/08/2016  . Tobacco abuse disorder 02/01/2014    Current Outpatient Prescriptions on File Prior to Visit   Medication Sig Dispense Refill  . albuterol (ACCUNEB) 1.25 MG/3ML nebulizer solution Take 3 mLs (1.25 mg total) by nebulization every 6 (six) hours as needed for wheezing. 75 mL 1  . albuterol (PROVENTIL HFA;VENTOLIN HFA) 108 (90 Base) MCG/ACT inhaler Inhale 2 puffs into the lungs every 6 (six) hours as needed for wheezing or shortness of breath. Only dispense Ventolin 1 Inhaler 3  . BD PEN NEEDLE NANO U/F 32G X 4 MM MISC USE AS DIRECTED WITH LEVEMIR FLEXPEN 100 each 0  . Blood Glucose Monitoring Suppl (ONE TOUCH ULTRA 2) w/Device KIT Use as directed once daily to check blood sugar.  DX E11.9 1 each 0  . cefpodoxime (VANTIN) 200 MG tablet Take 1 tablet (200 mg total) by mouth 2 (two) times daily. 14 tablet 0  . cetirizine (ZYRTEC) 10 MG tablet Take 10 mg by mouth daily as needed for allergies.     . Cholecalciferol (VITAMIN D3) 2000 units TABS Take 2,000 Units by mouth every morning.    . fluticasone (FLONASE) 50 MCG/ACT nasal spray Place 2 sprays into both nostrils daily. (Patient taking differently: Place 2 sprays into both nostrils daily as needed for allergies. ) 16 g 6  . furosemide (LASIX) 20 MG tablet TAKE 1 TABLET BY MOUTH TWICE DAILY (Patient taking differently: TAKE 1 TABLET BY MOUTH TWICE DAILY PRN FOR SWELLING) 60 tablet 0  . Insulin Syringe-Needle U-100 31G X 5/16" 1 ML MISC To use w/ Novolog 100 each 12  . lactulose (CHRONULAC) 10  GM/15ML solution TAKE 45 MLS BY MOUTH TWICE DAILY AS NEEDED FOR MILD CONSTIPATION. 1892 mL 6  . LEVEMIR FLEXTOUCH 100 UNIT/ML Pen INJECT 14 UNITS UNDER THE SKIN EVERY DAY AT 10PM 15 mL 0  . montelukast (SINGULAIR) 10 MG tablet Take 1 tablet (10 mg total) by mouth daily as needed. (Patient taking differently: Take 10 mg by mouth daily as needed (allergies). ) 30 tablet 2  . ONE TOUCH ULTRA TEST test strip USE TWICE DAILY TO CHECK BLOOD SUGAR AS DIRECTED 100 each 4  . Polyvinyl Alcohol (LUBRICANT DROPS OP) Apply 1 drop to eye daily as needed (dry eyes).    .  rifaximin (XIFAXAN) 550 MG TABS tablet Take 1 tablet (550 mg total) by mouth 2 (two) times daily. (Patient taking differently: Take 550 mg by mouth 2 (two) times daily as needed (cramping). ) 60 tablet 11  . sodium chloride (OCEAN) 0.65 % SOLN nasal spray Place 1 spray into both nostrils as needed for congestion. 30 mL 0  . spironolactone (ALDACTONE) 50 MG tablet TAKE 2 TABLETS(100 MG) BY MOUTH DAILY (Patient taking differently: TAKE 2 TABLETS(100 MG) BY MOUTH DAILY PRN FOR FLUID) 60 tablet 0  . tiZANidine (ZANAFLEX) 4 MG tablet Take 1 tablet (4 mg total) by mouth every 8 (eight) hours as needed for muscle spasms. (Patient taking differently: Take 2 mg by mouth at bedtime. ) 30 tablet 1   No current facility-administered medications on file prior to visit.     Past Surgical History:  Procedure Laterality Date  . APPENDECTOMY  2007  . BREAST SURGERY  2009 right  . CATARACT EXTRACTION     x 2  . ESOPHAGOGASTRODUODENOSCOPY (EGD) WITH PROPOFOL N/A 08/14/2016   Procedure: ESOPHAGOGASTRODUODENOSCOPY (EGD) WITH PROPOFOL;  Surgeon: Mauri Pole, MD;  Location: [facility];  Service: Endoscopy;  Laterality: N/A;  . [place]  . KNEE SURGERY    . MANDIBLE FRACTURE SURGERY    . MASTECTOMY    . PILONIDAL CYST EXCISION    . TONSILLECTOMY      Allergies  Allergen Reactions  . Citalopram Palpitations    Irregular heart beat  . Erythromycin Other (See Comments)    Stomach cramps  . Glimepiride     Elevated ammonia levels  . Prednisone     Increased blood sugars too high  . Versed [Midazolam] Other (See Comments)    Patient stayed confusion stayed 4+days     Social History   Social History  . Marital status: Single    Spouse name: N/A  . Number of children: 0  . Years of education: N/A   Occupational History  . retired Retired   Social History Main Topics  . Smoking status: Current Some Day Smoker    Packs/day: 0.25    Years: 58.00    Types: Cigarettes     Start date: 07/27/1964  . Smokeless tobacco: Never Used     Comment: 1 pack per week, tobacco infor given 12/30/15  . Alcohol use No  . Drug use: No  . Sexual activity: No   Other Topics Concern  . Not on file   Social History Narrative   Lives alone, continues to smoke, no dietary restrictions    Family History  Problem Relation Age of Onset  . Heart failure Father   . COPD Father   . Arthritis Father 27  . Stroke Mother   . Arthritis Mother 5  . Hyperlipidemia Mother   . Hypertension Mother   .  Diabetes Mother   . Diabetes Sister   . Breast cancer    . Breast cancer Maternal Aunt   . Asthma Maternal Aunt   . Birth defects Maternal Aunt   . Alcohol abuse Maternal Uncle   . Breast cancer Maternal Aunt     BP 134/71   Pulse 84   Ht _0  (1.702 m)   Wt 148 lb (67.1 kg)   BMI 23.18 kg/m   Review of Systems: See HPI above.     Objective:  Physical Exam:  Gen: NAD, comfortable in exam room  Right knee: Exam not repeated today. No gross deformity, ecchymoses, effusion. No TTP currently. FROM. Negative ant/post drawers. Negative valgus/varus testing. Negative lachmanns. Negative mcmurrays, apleys, patellar apprehension. NV intact distally.  Left knee; FROM without pain.   Assessment & Plan:  1. 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  s/p cortisone injection.  Repeating viscosupplementation starting today.  Topical medications.  Home exercises.   Heat/ice.  Continue use of cane or walker.   After informed written consent, patient was seated on exam table. Right knee was prepped with alcohol swab and utilizing anteromedial approach, patient's right knee was injected intraarticularly with 29m bupivicaine followed by supartz. Patient tolerated the procedure well without immediate complications.

## 2016-11-30 ENCOUNTER — Other Ambulatory Visit: Payer: Self-pay | Admitting: Pharmacist

## 2016-11-30 ENCOUNTER — Other Ambulatory Visit: Payer: Self-pay

## 2016-11-30 NOTE — Patient Outreach (Signed)
[place] [facility]) Care Management  11/30/2016  Ashley Savage 03/13/1941 [id number]  Patient was referred to [place] by [facility] RN Telephonic, Lamar Blinks, for questions regarding medications.   Unsuccessful phone outreach to patient, no answer, HIPAA compliant message left requesting return call from patient.   Plan:  If no return call, will make second phone outreach attempt in the next week.   Karrie Meres, PharmD, [place] [phone]

## 2016-11-30 NOTE — Patient Outreach (Signed)
[place] [facility]) Care Management  11/30/2016  Ashley Savage 11-11-1940 [id number]   Telephone call to patient regarding initial telephone assessment. Unable to reach. HIPAA compliant voice message left with call back phone number.   PLAN; RNCM will attempt follow up with patient within 1 week.  [name] RN,BSN,CCM [facility] Telephonic  [phone]

## 2016-12-02 ENCOUNTER — Encounter: Payer: Self-pay | Admitting: Medical

## 2016-12-02 ENCOUNTER — Ambulatory Visit (INDEPENDENT_AMBULATORY_CARE_PROVIDER_SITE_OTHER): Payer: PPO | Admitting: Medical

## 2016-12-02 ENCOUNTER — Other Ambulatory Visit: Payer: Self-pay | Admitting: Pharmacist

## 2016-12-02 ENCOUNTER — Other Ambulatory Visit: Payer: Self-pay

## 2016-12-02 VITALS — BP 131/46 | HR 85 | Temp 98.0°F | Resp 16 | Ht 67.0 in | Wt 145.0 lb

## 2016-12-02 DIAGNOSIS — R8271 Bacteriuria: Secondary | ICD-10-CM | POA: Diagnosis not present

## 2016-12-02 DIAGNOSIS — E1165 Type 2 diabetes mellitus with hyperglycemia: Secondary | ICD-10-CM

## 2016-12-02 DIAGNOSIS — K7682 Hepatic encephalopathy: Secondary | ICD-10-CM

## 2016-12-02 DIAGNOSIS — R5383 Other fatigue: Secondary | ICD-10-CM

## 2016-12-02 DIAGNOSIS — K7581 Nonalcoholic steatohepatitis (NASH): Secondary | ICD-10-CM

## 2016-12-02 DIAGNOSIS — K7290 Hepatic failure, unspecified without coma: Secondary | ICD-10-CM

## 2016-12-02 DIAGNOSIS — Z87440 Personal history of urinary (tract) infections: Secondary | ICD-10-CM

## 2016-12-02 DIAGNOSIS — Z794 Long term (current) use of insulin: Principal | ICD-10-CM

## 2016-12-02 LAB — POC URINALSYSI DIPSTICK (AUTOMATED)
Bilirubin, UA: NEGATIVE
Blood, UA: NEGATIVE
Glucose, UA: NEGATIVE
Ketones, UA: NEGATIVE
LEUKOCYTES UA: NEGATIVE
Nitrite, UA: NEGATIVE
PH UA: 6 (ref 5.0–8.0)
PROTEIN UA: NEGATIVE
UROBILINOGEN UA: 0.2 U/dL

## 2016-12-02 LAB — AMMONIA: Ammonia: 68 umol/L — ABNORMAL HIGH (ref 11–35)

## 2016-12-02 NOTE — Patient Outreach (Signed)
[place] [facility]) Care Management  12/02/2016  AMIEE WILEY 1941/03/19 [id number]  Second unsuccessful phone outreach to patient.  No answer, HIPAA compliant message left requesting return call.   Plan:  If no return call, will make third phone outreach attempt to patient next week.   Karrie Meres, PharmD, [place] [phone]

## 2016-12-02 NOTE — Progress Notes (Signed)
Subjective:    [name], female    DOB: Sep 21, 1940, 76 y.o.   MRN: [id number]  HPI   Pt in feeling faint mild fatigue on and off every other day. Varies since dc. Some days very energetic/a lot of energy.  Pt had some acute confusion day of admission. Pt had ams. She was confused and could not put her t-shirt on. Pt got impression she had uti that caused ams or hepatic encephalopathy. Pt states her ammonia levels were better in the ED compared to prior. Pt was also told dehydrated. Pt rehydrated and she states since dc from hospital she is hydrating more.  Pt had culture done that showed multiple species but recollection suggested  Pt ct for AMS showed no acute abnormality. Pt states has good mentally since left hospital. In fact she reports cleared up quickly with anitbiotic use and hydration.    Pt has history of bronchitis, diabetes and liver cirrhosis.(fatty liver). Pt doe drink alcohol.(nash)  Pt is on xifaxan by GI for ammonia level. Pt still on lactulose but lower dose.  Pt states for uti discharged on vantin.  Review of Systems  Constitutional: Negative for chills, fatigue, fever and unexpected weight change.       See hpi. On and off fatigue.  HENT: Negative for congestion.   Respiratory: Negative for cough, chest tightness, shortness of breath and wheezing.        No obvious recent bronchitis symptoms.  Cardiovascular: Negative for chest pain and palpitations.  Gastrointestinal: Negative for abdominal pain, anal bleeding, blood in stool, diarrhea, nausea and vomiting.  Genitourinary: Negative for decreased urine volume, dyspareunia, dysuria, enuresis, flank pain, genital sores, hematuria, menstrual problem, pelvic pain, urgency and vaginal pain.  Musculoskeletal: Negative for back pain.  Neurological: Negative for dizziness, speech difficulty, weakness, numbness and headaches.       No longer.  Hematological: Negative for adenopathy. Does not  bruise/bleed easily.  Psychiatric/Behavioral: Negative for agitation, behavioral problems, confusion, decreased concentration, dysphoric mood, hallucinations, sleep disturbance and suicidal ideas. The patient is not nervous/anxious.     Past Medical History:  Diagnosis Date  . Allergic state 11/10/2016  . Anxiety   . Arthritis of both knees 10/01/2013  . Benign paroxysmal positional vertigo 10/01/2013  . Cancer [facility]) breast ca  right  . COPD (chronic obstructive pulmonary disease) ([place]) 10/01/2013  . Depression   . Diabetes mellitus type 2  . Emphysema   . Encephalopathy, hepatic ([place]) 06/07/2014  . Esophageal reflux 10/01/2013  . Fall 07/30/2016  . Hyperlipidemia   . Hyperlipidemia, mixed   . Increased ammonia level 11/25/2014  . NASH (nonalcoholic steatohepatitis) 08/26/2015  . Neck pain 10/01/2013  . Neuropathy    feet   . Overactive bladder 12/10/2013  . Panic attacks   . Pedal edema 12/10/2013  . Personal history of radiation therapy   . Preventative health care 03/08/2016  . Tobacco abuse disorder 02/01/2014     Social History   Social History  . Marital status: Single    Spouse name: N/A  . Number of children: 0  . Years of education: N/A   Occupational History  . retired Retired   Social History Main Topics  . Smoking status: Current Some Day Smoker    Packs/day: 0.25    Years: 58.00    Types: Cigarettes    Start date: 07/27/1964  . Smokeless tobacco: Never Used     Comment: 1 pack per week, tobacco infor given 12/30/15  .  Alcohol use No  . Drug use: No  . Sexual activity: No   Other Topics Concern  . Not on file   Social History Narrative   Lives alone, continues to smoke, no dietary restrictions    Past Surgical History:  Procedure Laterality Date  . APPENDECTOMY  2007  . BREAST SURGERY  2009 right  . CATARACT EXTRACTION     x 2  . ESOPHAGOGASTRODUODENOSCOPY (EGD) WITH PROPOFOL N/A 08/14/2016   Procedure: ESOPHAGOGASTRODUODENOSCOPY (EGD) WITH PROPOFOL;  Surgeon:  Mauri Pole, MD;  Location: [facility];  Service: Endoscopy;  Laterality: N/A;  . [place]  . KNEE SURGERY    . MANDIBLE FRACTURE SURGERY    . MASTECTOMY    . PILONIDAL CYST EXCISION    . TONSILLECTOMY      Family History  Problem Relation Age of Onset  . Heart failure Father   . COPD Father   . Arthritis Father 29  . Stroke Mother   . Arthritis Mother 54  . Hyperlipidemia Mother   . Hypertension Mother   . Diabetes Mother   . Diabetes Sister   . Breast cancer    . Breast cancer Maternal Aunt   . Asthma Maternal Aunt   . Birth defects Maternal Aunt   . Alcohol abuse Maternal Uncle   . Breast cancer Maternal Aunt     Allergies  Allergen Reactions  . Citalopram Palpitations    Irregular heart beat  . Erythromycin Other (See Comments)    Stomach cramps  . Glimepiride     Elevated ammonia levels  . Prednisone     Increased blood sugars too high  . Versed [Midazolam] Other (See Comments)    Patient stayed confusion stayed 4+days     Current Outpatient Prescriptions on File Prior to Visit  Medication Sig Dispense Refill  . albuterol (ACCUNEB) 1.25 MG/3ML nebulizer solution Take 3 mLs (1.25 mg total) by nebulization every 6 (six) hours as needed for wheezing. 75 mL 1  . albuterol (PROVENTIL HFA;VENTOLIN HFA) 108 (90 Base) MCG/ACT inhaler Inhale 2 puffs into the lungs every 6 (six) hours as needed for wheezing or shortness of breath. Only dispense Ventolin 1 Inhaler 3  . BD PEN NEEDLE NANO U/F 32G X 4 MM MISC USE AS DIRECTED WITH LEVEMIR FLEXPEN 100 each 0  . Blood Glucose Monitoring Suppl (ONE TOUCH ULTRA 2) w/Device KIT Use as directed once daily to check blood sugar.  DX E11.9 1 each 0  . cefpodoxime (VANTIN) 200 MG tablet Take 1 tablet (200 mg total) by mouth 2 (two) times daily. 14 tablet 0  . cetirizine (ZYRTEC) 10 MG tablet Take 10 mg by mouth daily as needed for allergies.     . Cholecalciferol (VITAMIN D3) 2000 units TABS Take 2,000 Units  by mouth every morning.    . fluticasone (FLONASE) 50 MCG/ACT nasal spray Place 2 sprays into both nostrils daily. (Patient taking differently: Place 2 sprays into both nostrils daily as needed for allergies. ) 16 g 6  . furosemide (LASIX) 20 MG tablet TAKE 1 TABLET BY MOUTH TWICE DAILY (Patient taking differently: TAKE 1 TABLET BY MOUTH TWICE DAILY PRN FOR SWELLING) 60 tablet 0  . Insulin Syringe-Needle U-100 31G X 5/16" 1 ML MISC To use w/ Novolog 100 each 12  . lactulose (CHRONULAC) 10 GM/15ML solution TAKE 45 MLS BY MOUTH TWICE DAILY AS NEEDED FOR MILD CONSTIPATION. 1892 mL 6  . LEVEMIR FLEXTOUCH 100 UNIT/ML Pen INJECT 14 UNITS  UNDER THE SKIN EVERY DAY AT 10PM 15 mL 0  . montelukast (SINGULAIR) 10 MG tablet Take 1 tablet (10 mg total) by mouth daily as needed. (Patient taking differently: Take 10 mg by mouth daily as needed (allergies). ) 30 tablet 2  . ONE TOUCH ULTRA TEST test strip USE TWICE DAILY TO CHECK BLOOD SUGAR AS DIRECTED 100 each 4  . Polyvinyl Alcohol (LUBRICANT DROPS OP) Apply 1 drop to eye daily as needed (dry eyes).    . rifaximin (XIFAXAN) 550 MG TABS tablet Take 1 tablet (550 mg total) by mouth 2 (two) times daily. (Patient taking differently: Take 550 mg by mouth 2 (two) times daily as needed (cramping). ) 60 tablet 11  . sodium chloride (OCEAN) 0.65 % SOLN nasal spray Place 1 spray into both nostrils as needed for congestion. 30 mL 0  . spironolactone (ALDACTONE) 50 MG tablet TAKE 2 TABLETS(100 MG) BY MOUTH DAILY (Patient taking differently: TAKE 2 TABLETS(100 MG) BY MOUTH DAILY PRN FOR FLUID) 60 tablet 0  . tiZANidine (ZANAFLEX) 4 MG tablet Take 1 tablet (4 mg total) by mouth every 8 (eight) hours as needed for muscle spasms. (Patient taking differently: Take 2 mg by mouth at bedtime. ) 30 tablet 1  . traMADol (ULTRAM-ER) 100 MG 24 hr tablet      No current facility-administered medications on file prior to visit.     BP (!) 131/46 (BP Location: Left Arm, Patient Position:  Sitting, Cuff Size: Normal)   Pulse 85   Temp 98 F (36.7 C) (Oral)   Resp 16   Ht _0  (1.702 m)   Wt 145 lb (65.8 kg)   SpO2 96%   BMI 22.71 kg/m       Objective:   Physical Exam  General Mental Status- Alert. General Appearance- Not in acute distress.   Skin General: Color- Normal Color. Moisture- Normal Moisture.  Neck Carotid Arteries- Normal color. Moisture- Normal Moisture. No carotid bruits. No JVD.  Chest and Lung Exam Auscultation: Breath Sounds:-Normal.  Cardiovascular Auscultation:Rythm- Regular. Murmurs & Other Heart Sounds:Auscultation of the heart reveals- No Murmurs.  Abdomen Inspection:-Inspeection Normal. Palpation/Percussion:Note:No mass. Palpation and Percussion of the abdomen reveal- Non Tender, Non Distended + BS, no rebound or guarding.   Neurologic Cranial Nerve exam:- CN III-XII intact(No nystagmus), symmetric smile. Strength:- 5/5 equal and symmetric strength both upper and lower extremities.      Assessment & Plan:  You look good presently overall with good mental status.  For you recent illness, hx of uti and possible encephalopathy will get cbc, cmp, ammonia level and repeat urine culture.  Continue your current mediations as on discharge. We will follow labs and see if any uti presenty.   If you get recurrent altered mental status please let us know. If severe ams then ED evaluation.  Follow up in 3 weeks or as needed  Shylyn Younce, Percell Miller, [employer]

## 2016-12-02 NOTE — Telephone Encounter (Signed)
Dr. Charlett Blake,   Pt had carotid US done recently. Looks like maybe ordered by Dr. Nani Ravens. 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 Not yet at 75%. Pt wants to know what next step is. Do you want me to refer to vascular surgeon or just advise we will check again in one year.   I saw her for follow up hospitalization and she brought this up end of visit.  Thanks, Architect, Percell Miller, PA-C

## 2016-12-02 NOTE — Progress Notes (Signed)
Pre visit review using our clinic review tool, if applicable. No additional management support is needed unless otherwise documented below in the visit note. 

## 2016-12-02 NOTE — Patient Instructions (Signed)
You look good presently overall with good mental status.  For you recent illness, hx of uti and possible encephalopathy will get cbc, cmp, ammonia level and repeat urine culture.  Continue your current mediations as on discharge. We will follow labs and see if any uti presenty.   If you get recurrent altered mental status please let us know. If severe ams then ED evaluation.  Follow up in 3 weeks or as needed

## 2016-12-02 NOTE — Patient Outreach (Signed)
[place] [facility]) Care Management  12/02/2016  CHEMIKA NIGHTENGALE 1940-08-31 [id number]  Second telephone call to patient regarding initial telephone assessment. Unable to reach. HIPAA compliant voice message left with call back phone number.   PLAN; RNCM will attempt 3rd  follow up with patient within 1 week.  [name] RN,BSN,CCM [facility] Telephonic  [phone]

## 2016-12-03 ENCOUNTER — Encounter: Payer: Self-pay | Admitting: Family Medicine

## 2016-12-03 ENCOUNTER — Ambulatory Visit: Payer: PPO | Admitting: Pharmacist

## 2016-12-03 ENCOUNTER — Ambulatory Visit (INDEPENDENT_AMBULATORY_CARE_PROVIDER_SITE_OTHER): Payer: PPO | Admitting: Family Medicine

## 2016-12-03 VITALS — BP 118/74 | HR 81 | Ht 67.0 in | Wt 145.0 lb

## 2016-12-03 DIAGNOSIS — M1711 Unilateral primary osteoarthritis, right knee: Secondary | ICD-10-CM

## 2016-12-03 LAB — CBC WITH DIFFERENTIAL/PLATELET
Basophils Absolute: 0 10*3/uL (ref 0.0–0.1)
Basophils Relative: 0.4 % (ref 0.0–3.0)
EOS ABS: 0.2 10*3/uL (ref 0.0–0.7)
Eosinophils Relative: 3.2 % (ref 0.0–5.0)
HEMATOCRIT: 44 % (ref 36.0–46.0)
HEMOGLOBIN: 14.7 g/dL (ref 12.0–15.0)
LYMPHS PCT: 27.7 % (ref 12.0–46.0)
Lymphs Abs: 2 10*3/uL (ref 0.7–4.0)
MCHC: 33.5 g/dL (ref 30.0–36.0)
MCV: 98.5 fl (ref 78.0–100.0)
MONO ABS: 1 10*3/uL (ref 0.1–1.0)
Monocytes Relative: 13.5 % — ABNORMAL HIGH (ref 3.0–12.0)
Neutro Abs: 4 10*3/uL (ref 1.4–7.7)
Neutrophils Relative %: 55.2 % (ref 43.0–77.0)
PLATELETS: 143 10*3/uL — AB (ref 150.0–400.0)
RBC: 4.46 Mil/uL (ref 3.87–5.11)
RDW: 16.4 % — AB (ref 11.5–15.5)
WBC: 7.3 10*3/uL (ref 4.0–10.5)

## 2016-12-03 LAB — COMPREHENSIVE METABOLIC PANEL
ALT: 30 U/L (ref 0–35)
AST: 43 U/L — AB (ref 0–37)
Albumin: 3 g/dL — ABNORMAL LOW (ref 3.5–5.2)
Alkaline Phosphatase: 154 U/L — ABNORMAL HIGH (ref 39–117)
BILIRUBIN TOTAL: 1.9 mg/dL — AB (ref 0.2–1.2)
BUN: 23 mg/dL (ref 6–23)
CALCIUM: 9.6 mg/dL (ref 8.4–10.5)
CHLORIDE: 102 meq/L (ref 96–112)
CO2: 29 meq/L (ref 19–32)
Creatinine, Ser: 0.95 mg/dL (ref 0.40–1.20)
GFR: 61.18 mL/min (ref >60.00)
Glucose, Bld: 110 mg/dL — ABNORMAL HIGH (ref 70–99)
Potassium: 4.7 mEq/L (ref 3.5–5.1)
SODIUM: 138 meq/L (ref 135–145)
Total Protein: 5.9 g/dL — ABNORMAL LOW (ref 6.0–8.3)

## 2016-12-03 LAB — URINE CULTURE

## 2016-12-03 NOTE — Telephone Encounter (Signed)
Left pt message asking to call Allison back directly at [phone] to schedule AWV. Thanks! °

## 2016-12-04 ENCOUNTER — Ambulatory Visit: Payer: PPO | Admitting: Family Medicine

## 2016-12-04 MED ORDER — SODIUM HYALURONATE (VISCOSUP) 25 MG/2.5ML IX SOSY
2.5000 mL | PREFILLED_SYRINGE | Freq: Once | INTRA_ARTICULAR | Status: AC
  Administered 2016-12-04: 2.5 mL via INTRA_ARTICULAR

## 2016-12-06 NOTE — Progress Notes (Signed)
PCP: Mosie Lukes, MD  Subjective:   HPI: Patient is a 76 y.o. female here for right knee pain.  2/20: Patient reports she is overall doing well with PT for her hip. However, started to get pain over past few weeks in anterior right knee. Worse on lateral side. Has history of meniscectomy here remotely. Pain is 8/10 and sharp. Worse with walking. Using a walker. Tried flexogenix without benefit. Last cortisone shot by outside physician was early last year. No skin changes, numbness.  3/20: Patient reports she feels improved compared to last visit. Doing well with physical therapy and home exercises. Pain level is 4/10. Injection helped a lot first 3 weeks. No swelling. No skin changes, numbness.  4/23: Patient reports she's still having right knee pain. Pain level is 5/10, worse with prolonged standing, twisting, if cat hits her knee just right, can be sharp. Pain is anterior. No new injuries. Using a cane to help get around. No skin changes, numbness.  5/4: Patient reports her pain is 5/10 level. No new skin changes, numbness.  5/10: Patient reports right knee feels better than last visit. Pain level 3/10, anterior. No skin changes, numbness.  Past Medical History:  Diagnosis Date  . Allergic state 11/10/2016  . Anxiety   . Arthritis of both knees 10/01/2013  . Benign paroxysmal positional vertigo 10/01/2013  . Cancer [facility]) breast ca  right  . COPD (chronic obstructive pulmonary disease) ([place]) 10/01/2013  . Depression   . Diabetes mellitus type 2  . Emphysema   . Encephalopathy, hepatic ([place]) 06/07/2014  . Esophageal reflux 10/01/2013  . Fall 07/30/2016  . Hyperlipidemia   . Hyperlipidemia, mixed   . Increased ammonia level 11/25/2014  . NASH (nonalcoholic steatohepatitis) 08/26/2015  . Neck pain 10/01/2013  . Neuropathy    feet   . Overactive bladder 12/10/2013  . Panic attacks   . Pedal edema 12/10/2013  . Personal history of radiation therapy   . Preventative  health care 03/08/2016  . Tobacco abuse disorder 02/01/2014    Current Outpatient Prescriptions on File Prior to Visit  Medication Sig Dispense Refill  . albuterol (ACCUNEB) 1.25 MG/3ML nebulizer solution Take 3 mLs (1.25 mg total) by nebulization every 6 (six) hours as needed for wheezing. 75 mL 1  . albuterol (PROVENTIL HFA;VENTOLIN HFA) 108 (90 Base) MCG/ACT inhaler Inhale 2 puffs into the lungs every 6 (six) hours as needed for wheezing or shortness of breath. Only dispense Ventolin 1 Inhaler 3  . BD PEN NEEDLE NANO U/F 32G X 4 MM MISC USE AS DIRECTED WITH LEVEMIR FLEXPEN 100 each 0  . Blood Glucose Monitoring Suppl (ONE TOUCH ULTRA 2) w/Device KIT Use as directed once daily to check blood sugar.  DX E11.9 1 each 0  . cefpodoxime (VANTIN) 200 MG tablet Take 1 tablet (200 mg total) by mouth 2 (two) times daily. 14 tablet 0  . cetirizine (ZYRTEC) 10 MG tablet Take 10 mg by mouth daily as needed for allergies.     . Cholecalciferol (VITAMIN D3) 2000 units TABS Take 2,000 Units by mouth every morning.    . fluticasone (FLONASE) 50 MCG/ACT nasal spray Place 2 sprays into both nostrils daily. (Patient taking differently: Place 2 sprays into both nostrils daily as needed for allergies. ) 16 g 6  . furosemide (LASIX) 20 MG tablet TAKE 1 TABLET BY MOUTH TWICE DAILY (Patient taking differently: TAKE 1 TABLET BY MOUTH TWICE DAILY PRN FOR SWELLING) 60 tablet 0  . Insulin Syringe-Needle  U-100 31G X 5/16" 1 ML MISC To use w/ Novolog 100 each 12  . lactulose (CHRONULAC) 10 GM/15ML solution TAKE 45 MLS BY MOUTH TWICE DAILY AS NEEDED FOR MILD CONSTIPATION. 1892 mL 6  . LEVEMIR FLEXTOUCH 100 UNIT/ML Pen INJECT 14 UNITS UNDER THE SKIN EVERY DAY AT 10PM 15 mL 0  . montelukast (SINGULAIR) 10 MG tablet Take 1 tablet (10 mg total) by mouth daily as needed. (Patient taking differently: Take 10 mg by mouth daily as needed (allergies). ) 30 tablet 2  . ONE TOUCH ULTRA TEST test strip USE TWICE DAILY TO CHECK BLOOD SUGAR  AS DIRECTED 100 each 4  . Polyvinyl Alcohol (LUBRICANT DROPS OP) Apply 1 drop to eye daily as needed (dry eyes).    . rifaximin (XIFAXAN) 550 MG TABS tablet Take 1 tablet (550 mg total) by mouth 2 (two) times daily. (Patient taking differently: Take 550 mg by mouth 2 (two) times daily as needed (cramping). ) 60 tablet 11  . sodium chloride (OCEAN) 0.65 % SOLN nasal spray Place 1 spray into both nostrils as needed for congestion. 30 mL 0  . spironolactone (ALDACTONE) 50 MG tablet TAKE 2 TABLETS(100 MG) BY MOUTH DAILY (Patient taking differently: TAKE 2 TABLETS(100 MG) BY MOUTH DAILY PRN FOR FLUID) 60 tablet 0  . tiZANidine (ZANAFLEX) 4 MG tablet Take 1 tablet (4 mg total) by mouth every 8 (eight) hours as needed for muscle spasms. (Patient taking differently: Take 2 mg by mouth at bedtime. ) 30 tablet 1  . traMADol (ULTRAM-ER) 100 MG 24 hr tablet      No current facility-administered medications on file prior to visit.     Past Surgical History:  Procedure Laterality Date  . APPENDECTOMY  2007  . BREAST SURGERY  2009 right  . CATARACT EXTRACTION     x 2  . ESOPHAGOGASTRODUODENOSCOPY (EGD) WITH PROPOFOL N/A 08/14/2016   Procedure: ESOPHAGOGASTRODUODENOSCOPY (EGD) WITH PROPOFOL;  Surgeon: Mauri Pole, MD;  Location: [facility];  Service: Endoscopy;  Laterality: N/A;  . [place]  . KNEE SURGERY    . MANDIBLE FRACTURE SURGERY    . MASTECTOMY    . PILONIDAL CYST EXCISION    . TONSILLECTOMY      Allergies  Allergen Reactions  . Citalopram Palpitations    Irregular heart beat  . Erythromycin Other (See Comments)    Stomach cramps  . Glimepiride     Elevated ammonia levels  . Prednisone     Increased blood sugars too high  . Versed [Midazolam] Other (See Comments)    Patient stayed confusion stayed 4+days     Social History   Social History  . Marital status: Single    Spouse name: N/A  . Number of children: 0  . Years of education: N/A   Occupational  History  . retired Retired   Social History Main Topics  . Smoking status: Current Some Day Smoker    Packs/day: 0.25    Years: 58.00    Types: Cigarettes    Start date: 07/27/1964  . Smokeless tobacco: Never Used     Comment: 1 pack per week, tobacco infor given 12/30/15  . Alcohol use No  . Drug use: No  . Sexual activity: No   Other Topics Concern  . Not on file   Social History Narrative   Lives alone, continues to smoke, no dietary restrictions    Family History  Problem Relation Age of Onset  . Heart failure Father   .  COPD Father   . Arthritis Father 80  . Stroke Mother   . Arthritis Mother 11  . Hyperlipidemia Mother   . Hypertension Mother   . Diabetes Mother   . Diabetes Sister   . Breast cancer Unknown   . Breast cancer Maternal Aunt   . Asthma Maternal Aunt   . Birth defects Maternal Aunt   . Alcohol abuse Maternal Uncle   . Breast cancer Maternal Aunt     BP 118/74   Pulse 81   Ht '5\' 7"'  (1.702 m)   Wt 145 lb (65.8 kg)   BMI 22.71 kg/m   Review of Systems: See HPI above.     Objective:  Physical Exam:  Gen: NAD, comfortable in exam room  Right knee: Exam not repeated today. No gross deformity, ecchymoses, effusion. No TTP currently. FROM. Negative ant/post drawers. Negative valgus/varus testing. Negative lachmanns. Negative mcmurrays, apleys, patellar apprehension. NV intact distally.  Left knee; FROM without pain.   Assessment & Plan:  1. 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  s/p cortisone injection.  Second supartz given today - will return in a week for third injection.  Topical medications.  Home exercises.   Heat/ice.  Continue use of cane or walker.  Will return tomorrow with her knee brace to see if we can make this more comfortable for her.  After informed written consent, patient was seated on exam table. Right knee was prepped with alcohol swab and utilizing anteromedial approach, patient's right knee was injected intraarticularly with  40m bupivicaine followed by supartz. Patient tolerated the procedure well without immediate complications.

## 2016-12-06 NOTE — Assessment & Plan Note (Signed)
s/p cortisone injection.  Second supartz given today - will return in a week for third injection.  Topical medications.  Home exercises.   Heat/ice.  Continue use of cane or walker.  Will return tomorrow with her knee brace to see if we can make this more comfortable for her.  After informed written consent, patient was seated on exam table. Right knee was prepped with alcohol swab and utilizing anteromedial approach, patient's right knee was injected intraarticularly with 45mL bupivicaine followed by supartz. Patient tolerated the procedure well without immediate complications.

## 2016-12-07 ENCOUNTER — Ambulatory Visit: Payer: PPO

## 2016-12-07 ENCOUNTER — Other Ambulatory Visit: Payer: Self-pay | Admitting: Pharmacist

## 2016-12-07 ENCOUNTER — Other Ambulatory Visit: Payer: Self-pay

## 2016-12-07 NOTE — Patient Outreach (Addendum)
[place] [facility]) [facility] [facility]) Care Management  [place]  12/08/2016   Ashley Savage 1940/08/23 [id number]   Initial Assessment Referral date:11/13/16 Referral source:EMMI prevent  Referral reason:EMMI prevent score: 11 Insurance: [employer]  Providers:  Dr. Willette Alma: primary MD Dr. Karlton Lemon: sports medicine Dr. Kara Mead:  Pulmonary  Dr. Marin Olp: Hematology/ oncology Dr. Nicoletta Ba: Gastroenterology  SOCIAL: Patient lives alone. Patient is able to drive  Subjective: Telephone call to patient to complete initial telephone assessment. HIPAA verified with patient.  Patient confirmed last follow up with her primary MD was 12/02/16.  Patient states she has completed antibiotics for previous urinary tract infection.  Patient confirmed follow up appointment scheduled with Dr. Felipe Drone on 12/10/16, Dr. Elsworth Soho on 12/10/16 and Dr. Marin Olp on 01/05/17. Patient verbally agreed to continued follow up calls from [facility] for cirrhosis of the liver disease management/ education.  Patient reports she has her lab work checked frequently.  Patient states her ammonia levels are checked frequently because elevated levels can cause her to have mental confusion.  Patient denies any new onset of symptoms at this time.  Patient reports she does have COPD that she is managing.  Request follow up education for her COPD at a later time.  Patient confirms she has all of her medications, is able to afford them and is taking her medications as prescribed.  Patient states she is able to get to her doctors appointments. Still able to drive.  Patient aware to contact her doctor for new onset of symptoms.  Patient states she is aware of how to activate 911 for  Severe symptoms.    Objective: see assessment  Encounter Medications:  Outpatient Encounter Prescriptions as of 12/07/2016  Medication Sig  . albuterol (ACCUNEB) 1.25 MG/3ML nebulizer  solution Take 3 mLs (1.25 mg total) by nebulization every 6 (six) hours as needed for wheezing.  Marland Kitchen albuterol (PROVENTIL HFA;VENTOLIN HFA) 108 (90 Base) MCG/ACT inhaler Inhale 2 puffs into the lungs every 6 (six) hours as needed for wheezing or shortness of breath. Only dispense Ventolin  . BD PEN NEEDLE NANO U/F 32G X 4 MM MISC USE AS DIRECTED WITH LEVEMIR FLEXPEN  . Blood Glucose Monitoring Suppl (ONE TOUCH ULTRA 2) w/Device KIT Use as directed once daily to check blood sugar.  DX E11.9  . cefpodoxime (VANTIN) 200 MG tablet Take 1 tablet (200 mg total) by mouth 2 (two) times daily.  . cetirizine (ZYRTEC) 10 MG tablet Take 10 mg by mouth daily as needed for allergies.   . Cholecalciferol (VITAMIN D3) 2000 units TABS Take 2,000 Units by mouth every morning.  . fluticasone (FLONASE) 50 MCG/ACT nasal spray Place 2 sprays into both nostrils daily. (Patient taking differently: Place 2 sprays into both nostrils daily as needed for allergies. )  . furosemide (LASIX) 20 MG tablet TAKE 1 TABLET BY MOUTH TWICE DAILY (Patient taking differently: TAKE 1 TABLET BY MOUTH TWICE DAILY PRN FOR SWELLING)  . Insulin Syringe-Needle U-100 31G X 5/16" 1 ML MISC To use w/ Novolog  . lactulose (CHRONULAC) 10 GM/15ML solution TAKE 45 MLS BY MOUTH TWICE DAILY AS NEEDED FOR MILD CONSTIPATION.  Marland Kitchen LEVEMIR FLEXTOUCH 100 UNIT/ML Pen INJECT 14 UNITS UNDER THE SKIN EVERY DAY AT 10PM  . montelukast (SINGULAIR) 10 MG tablet Take 1 tablet (10 mg total) by mouth daily as needed. (Patient taking differently: Take 10 mg by mouth daily as needed (allergies). )  . ONE TOUCH ULTRA  TEST test strip USE TWICE DAILY TO CHECK BLOOD SUGAR AS DIRECTED  . Polyvinyl Alcohol (LUBRICANT DROPS OP) Apply 1 drop to eye daily as needed (dry eyes).  . rifaximin (XIFAXAN) 550 MG TABS tablet Take 1 tablet (550 mg total) by mouth 2 (two) times daily. (Patient taking differently: Take 550 mg by mouth 2 (two) times daily as needed (cramping). )  . sodium  chloride (OCEAN) 0.65 % SOLN nasal spray Place 1 spray into both nostrils as needed for congestion.  Marland Kitchen spironolactone (ALDACTONE) 50 MG tablet TAKE 2 TABLETS(100 MG) BY MOUTH DAILY (Patient taking differently: TAKE 2 TABLETS(100 MG) BY MOUTH DAILY PRN FOR FLUID)  . tiZANidine (ZANAFLEX) 4 MG tablet Take 1 tablet (4 mg total) by mouth every 8 (eight) hours as needed for muscle spasms. (Patient taking differently: Take 2 mg by mouth at bedtime. )  . traMADol (ULTRAM-ER) 100 MG 24 hr tablet    No facility-administered encounter medications on file as of 12/07/2016.     Functional Status:  In your present state of health, do you have any difficulty performing the following activities: 12/07/2016 11/25/2016  Hearing? Y N  Vision? N N  Difficulty concentrating or making decisions? N Y  Walking or climbing stairs? Y Y  Dressing or bathing? N Y  Doing errands, shopping? N Y  Conservation officer, nature and eating ? N -  Using the Toilet? N -  In the past six months, have you accidently leaked urine? Y -  Do you have problems with loss of bowel control? N -  Managing your Medications? N -  Managing your Finances? N -  Housekeeping or managing your Housekeeping? N -  Some recent data might be hidden    Fall/Depression Screening: Fall Risk  12/07/2016 10/22/2016 05/04/2016  Falls in the past year? Yes Yes Yes  Number falls in past yr: _0 or more  Injury with Fall? Yes Yes No  Risk Factor Category  High Fall Risk - -  Risk for fall due to : History of fall(s) Impaired balance/gait;History of fall(s) Impaired balance/gait  Risk for fall due to (comments): - - Has an arthritic right knee  Follow up Falls prevention discussed Education provided;Falls prevention discussed Falls prevention discussed   PHQ 2/9 Scores 11/20/2016 10/22/2016 05/04/2016 02/25/2016 02/08/2015 02/08/2015 12/26/2014  PHQ - 2 Score 0 1 0 0 1 0 0   THN CM Care Plan Problem One     Most Recent Value  Care Plan Problem One  Knowledge deficit  related to cirrhosis of the liver ( NASH)  Role Documenting the Problem One  Care Management Telephonic Coordinator  Care Plan for Problem One  Active  THN Long Term Goal (31-90 days)  Patient will states 3 ways she can manage her cirrhosis of the liver NASH within 60 days  THN Long Term Goal Start Date  12/07/16  Interventions for Problem One Long Term Goal  RNCM sent patient EMMI education material regarding NASH.   THN CM Short Term Goal #1 (0-30 days)  Patient will report she has kept follow up appointment with hematology / oncology doctor within 30 days  THN CM Short Term Goal #1 Start Date  12/07/16  Interventions for Short Term Goal #1  RNCM reviewed with patient follow up appointment schedule.  RNCM discussed with patient importance of keeping follow up appointment with doctor regarding her cirrhosis  THN CM Short Term Goal #2 (0-30 days)  Patient will identify 3 symptoms of NASH within  30 days.   THN CM Short Term Goal #2 Start Date  12/07/16  Interventions for Short Term Goal #2  RNCM sent patient EMMI education material on NASH to review.  RNCM discussed symptoms of NASH with patient and advised to report to doctor       Assessment: Patient will benefit from ongoing disease management/ education for cirrhosis of the liver (NASH)  Plan: RNCM will follow up with patient within 4 weeks. RNCM will send patients primary MD involvement letter.  RNCM will send patients primary initial telephone assessment RNCM will send patient [facility] welcome packet/ consent form.  RNCM will send patient EMMI education material regarding NASH/ liver cirrhosis.   [name] RN,BSN,CCM [facility] Telephonic  [phone]

## 2016-12-07 NOTE — Patient Outreach (Signed)
[place] [facility]) Care Management  12/07/2016  Ashley Savage [birth year] [id number]  Patient was referred to [place] by [facility] RN Telephonic, Lamar Blinks, for patient reported concerns about side effects from lactulose and furosemide.    Third outreach attempt to patient, HIPAA details verified with patient.  Patient reports she is no longer having issues with lactulose---she reports previously she had frequent diarrhea.  She reports not using lactulose as often now as she is on rifaximin.  She reports receiving rifaximin from [employer] patient assistance program.    Counseled program she will likely need to reapply for [employer] patient assistance each calendar year if she remains on the medication and counseled her to be sure to save her proof of income from [facility] when she receives that statement each year.    Patient denies concerns with taking furosemide.  She was counseled if she weighs herself first thing in the morning and records it, she may be able to determine when she is gaining excess fluid with weight increases and contact her prescriber with weight increases.   Patient denies other concerns with her medications at this time.  She denies other pharmacy related needs.    Plan:  Close pharmacy case.   Patient aware she can contact [facility] Pharmacist with other pharmacy related concerns.   Karrie Meres, PharmD, [place] [phone]

## 2016-12-10 ENCOUNTER — Encounter: Payer: Self-pay | Admitting: Family Medicine

## 2016-12-10 ENCOUNTER — Ambulatory Visit (INDEPENDENT_AMBULATORY_CARE_PROVIDER_SITE_OTHER): Payer: PPO | Admitting: Pulmonary Disease

## 2016-12-10 ENCOUNTER — Ambulatory Visit (INDEPENDENT_AMBULATORY_CARE_PROVIDER_SITE_OTHER): Payer: PPO | Admitting: Family Medicine

## 2016-12-10 ENCOUNTER — Encounter: Payer: Self-pay | Admitting: Pulmonary Disease

## 2016-12-10 VITALS — BP 112/61 | HR 73 | Ht 67.0 in | Wt 145.0 lb

## 2016-12-10 DIAGNOSIS — M1711 Unilateral primary osteoarthritis, right knee: Secondary | ICD-10-CM

## 2016-12-10 DIAGNOSIS — J449 Chronic obstructive pulmonary disease, unspecified: Secondary | ICD-10-CM

## 2016-12-10 DIAGNOSIS — Z720 Tobacco use: Secondary | ICD-10-CM | POA: Diagnosis not present

## 2016-12-10 MED ORDER — ALBUTEROL SULFATE 1.25 MG/3ML IN NEBU: 1.0000 | INHALATION_SOLUTION | Freq: Four times a day (QID) | RESPIRATORY_TRACT | 3 refills | Status: DC | PRN

## 2016-12-10 MED ORDER — SODIUM HYALURONATE (VISCOSUP) 25 MG/2.5ML IX SOSY
2.5000 mL | PREFILLED_SYRINGE | Freq: Once | INTRA_ARTICULAR | Status: AC
  Administered 2016-12-10: 2.5 mL via INTRA_ARTICULAR

## 2016-12-10 NOTE — Addendum Note (Signed)
Addended by: Valerie Salts on: 12/10/2016 03:59 PM   Modules accepted: Orders

## 2016-12-10 NOTE — Progress Notes (Signed)
   Subjective:    Patient ID: [name], female    DOB: January 16, 1941, 76 y.o.   MRN: [id number]  HPI  76 year old  smoker with gold C COPD NASH - (sees Dr Silverio Decamp) H/o breast CA - ennever, also thrombocytopenia  Chief Complaint  Patient presents with  . Follow-up    4 mo f/u for COPD. Pt states she has been wheezing a lot due to the weather changes.    7 month follow-up. She required oxygen transiently in 2017 and then came off. She continues to smoke-she is electronic cigarettes in between, pack lasts her 2 weeks. She was hospitalized 2 weeks ago for an episode of encephalopathy, ammonia level was normal and she was compliant with his lactulose dosing, it was felt that she may have a UTI and was discharged on antibiotics She reports increased wheezing and attributes it to the pollen season them or her dyspnea is at baseline. She had to stop pulmonary rehabilitation in 2017 when she developed hip pain after fall. She now has the pain & is in a brace  She gets Spiriva through an assistance program for the rest of the year from the company. She stopped using Advair since he does not like powder inhalers      Significant tests/ events reviewed   08/2015 ONO >> 2L [place] >> dc'd Spirometry July 2014 with FEV1 of 41%, ratio 45, FVC 68% Spirometry 10/2015 shows FEV1 53%, ratio 61   Review of Systems neg for any significant sore throat, dysphagia, itching, sneezing, nasal congestion or excess/ purulent secretions, fever, chills, sweats, unintended wt loss, pleuritic or exertional cp, hempoptysis, orthopnea pnd or change in chronic leg swelling. Also denies presyncope, palpitations, heartburn, abdominal pain, nausea, vomiting, diarrhea or change in bowel or urinary habits, dysuria,hematuria, rash, arthralgias, visual complaints, headache, numbness weakness or ataxia.     Objective:   Physical Exam   Gen. Pleasant, well-nourished, in no distress ENT - no thrush, no post nasal  drip Neck: No JVD, no thyromegaly, no carotid bruits Lungs: no use of accessory muscles, no dullness to percussion, clear without rales or rhonchi  Cardiovascular: Rhythm regular, heart sounds  normal, no murmurs or gallops, no peripheral edema Musculoskeletal: No deformities, no cyanosis or clubbing, uses cane to ambulate, rt knee brace         Assessment & Plan:

## 2016-12-10 NOTE — Assessment & Plan Note (Signed)
Smoking cessation was again discussed, and counseling provided She will use electronic cigarette

## 2016-12-10 NOTE — Patient Instructions (Signed)
Refills on albuterol nebs 

## 2016-12-10 NOTE — Assessment & Plan Note (Signed)
Refills on albuterol nebs She will continue on Spiriva once daily-she gets is 20 assistance program for the rest of the year. After that, we will change to lama/ laba combination such as stiolto

## 2016-12-10 NOTE — Progress Notes (Signed)
PCP: Mosie Lukes, MD  Subjective:   HPI: Patient is a 76 y.o. female here for right knee pain.  2/20: Patient reports she is overall doing well with PT for her hip. However, started to get pain over past few weeks in anterior right knee. Worse on lateral side. Has history of meniscectomy here remotely. Pain is 8/10 and sharp. Worse with walking. Using a walker. Tried flexogenix without benefit. Last cortisone shot by outside physician was early last year. No skin changes, numbness.  3/20: Patient reports she feels improved compared to last visit. Doing well with physical therapy and home exercises. Pain level is 4/10. Injection helped a lot first 3 weeks. No swelling. No skin changes, numbness.  4/23: Patient reports she's still having right knee pain. Pain level is 5/10, worse with prolonged standing, twisting, if cat hits her knee just right, can be sharp. Pain is anterior. No new injuries. Using a cane to help get around. No skin changes, numbness.  5/4: Patient reports her pain is 5/10 level. No new skin changes, numbness.  5/10: Patient reports right knee feels better than last visit. Pain level 3/10, anterior. No skin changes, numbness.  5/17: Patient returns for third supartz injection. Overall doing well with 3/10 level pain. Feels unsteady though. Her knee brace is difficult to put on. No skin changes, numbness.  Past Medical History:  Diagnosis Date  . Allergic state 11/10/2016  . Anxiety   . Arthritis of both knees 10/01/2013  . Benign paroxysmal positional vertigo 10/01/2013  . Cancer [facility]) breast ca  right  . COPD (chronic obstructive pulmonary disease) ([place]) 10/01/2013  . Depression   . Diabetes mellitus type 2  . Emphysema   . Encephalopathy, hepatic ([place]) 06/07/2014  . Esophageal reflux 10/01/2013  . Fall 07/30/2016  . Hyperlipidemia   . Hyperlipidemia, mixed   . Increased ammonia level 11/25/2014  . NASH (nonalcoholic steatohepatitis)  08/26/2015  . Neck pain 10/01/2013  . Neuropathy    feet   . Overactive bladder 12/10/2013  . Panic attacks   . Pedal edema 12/10/2013  . Personal history of radiation therapy   . Preventative health care 03/08/2016  . Tobacco abuse disorder 02/01/2014    Current Outpatient Prescriptions on File Prior to Visit  Medication Sig Dispense Refill  . albuterol (ACCUNEB) 1.25 MG/3ML nebulizer solution Take 3 mLs (1.25 mg total) by nebulization every 6 (six) hours as needed for wheezing. 75 mL 3  . albuterol (PROVENTIL HFA;VENTOLIN HFA) 108 (90 Base) MCG/ACT inhaler Inhale 2 puffs into the lungs every 6 (six) hours as needed for wheezing or shortness of breath. Only dispense Ventolin 1 Inhaler 3  . BD PEN NEEDLE NANO U/F 32G X 4 MM MISC USE AS DIRECTED WITH LEVEMIR FLEXPEN 100 each 0  . Blood Glucose Monitoring Suppl (ONE TOUCH ULTRA 2) w/Device KIT Use as directed once daily to check blood sugar.  DX E11.9 1 each 0  . cetirizine (ZYRTEC) 10 MG tablet Take 10 mg by mouth daily as needed for allergies.     . Cholecalciferol (VITAMIN D3) 2000 units TABS Take 2,000 Units by mouth every morning.    . fluticasone (FLONASE) 50 MCG/ACT nasal spray Place 2 sprays into both nostrils daily. (Patient taking differently: Place 2 sprays into both nostrils daily as needed for allergies. ) 16 g 6  . furosemide (LASIX) 20 MG tablet TAKE 1 TABLET BY MOUTH TWICE DAILY (Patient taking differently: TAKE 1 TABLET BY MOUTH TWICE DAILY PRN FOR  SWELLING) 60 tablet 0  . Insulin Syringe-Needle U-100 31G X 5/16" 1 ML MISC To use w/ Novolog 100 each 12  . lactulose (CHRONULAC) 10 GM/15ML solution TAKE 45 MLS BY MOUTH TWICE DAILY AS NEEDED FOR MILD CONSTIPATION. 1892 mL 6  . LEVEMIR FLEXTOUCH 100 UNIT/ML Pen INJECT 14 UNITS UNDER THE SKIN EVERY DAY AT 10PM 15 mL 0  . montelukast (SINGULAIR) 10 MG tablet Take 1 tablet (10 mg total) by mouth daily as needed. (Patient taking differently: Take 10 mg by mouth daily as needed (allergies). )  30 tablet 2  . ONE TOUCH ULTRA TEST test strip USE TWICE DAILY TO CHECK BLOOD SUGAR AS DIRECTED 100 each 4  . Polyvinyl Alcohol (LUBRICANT DROPS OP) Apply 1 drop to eye daily as needed (dry eyes).    . rifaximin (XIFAXAN) 550 MG TABS tablet Take 1 tablet (550 mg total) by mouth 2 (two) times daily. (Patient taking differently: Take 550 mg by mouth 2 (two) times daily as needed (cramping). ) 60 tablet 11  . sodium chloride (OCEAN) 0.65 % SOLN nasal spray Place 1 spray into both nostrils as needed for congestion. 30 mL 0  . spironolactone (ALDACTONE) 50 MG tablet TAKE 2 TABLETS(100 MG) BY MOUTH DAILY (Patient taking differently: TAKE 2 TABLETS(100 MG) BY MOUTH DAILY PRN FOR FLUID) 60 tablet 0  . tiZANidine (ZANAFLEX) 4 MG tablet Take 1 tablet (4 mg total) by mouth every 8 (eight) hours as needed for muscle spasms. (Patient taking differently: Take 2 mg by mouth at bedtime. ) 30 tablet 1  . traMADol (ULTRAM-ER) 100 MG 24 hr tablet      No current facility-administered medications on file prior to visit.     Past Surgical History:  Procedure Laterality Date  . APPENDECTOMY  2007  . BREAST SURGERY  2009 right  . CATARACT EXTRACTION     x 2  . ESOPHAGOGASTRODUODENOSCOPY (EGD) WITH PROPOFOL N/A 08/14/2016   Procedure: ESOPHAGOGASTRODUODENOSCOPY (EGD) WITH PROPOFOL;  Surgeon: Mauri Pole, MD;  Location: [facility] ENDOSCOPY;  Service: Endoscopy;  Laterality: N/A;  . [place]  . KNEE SURGERY    . MANDIBLE FRACTURE SURGERY    . MASTECTOMY    . PILONIDAL CYST EXCISION    . TONSILLECTOMY      Allergies  Allergen Reactions  . Citalopram Palpitations    Irregular heart beat  . Erythromycin Other (See Comments)    Stomach cramps  . Glimepiride     Elevated ammonia levels  . Prednisone     Increased blood sugars too high  . Versed [Midazolam] Other (See Comments)    Patient stayed confusion stayed 4+days     Social History   Social History  . Marital status: Single     Spouse name: N/A  . Number of children: 0  . Years of education: N/A   Occupational History  . retired Retired   Social History Main Topics  . Smoking status: Current Some Day Smoker    Packs/day: 0.25    Years: 58.00    Types: Cigarettes    Start date: 07/27/1964  . Smokeless tobacco: Never Used     Comment: 1 pack per week, tobacco infor given 12/30/15  . Alcohol use No  . Drug use: No  . Sexual activity: No   Other Topics Concern  . Not on file   Social History Narrative   Lives alone, continues to smoke, no dietary restrictions    Family History  Problem Relation  Age of Onset  . Heart failure Father   . COPD Father   . Arthritis Father 21  . Stroke Mother   . Arthritis Mother 46  . Hyperlipidemia Mother   . Hypertension Mother   . Diabetes Mother   . Diabetes Sister   . Breast cancer Unknown   . Breast cancer Maternal Aunt   . Asthma Maternal Aunt   . Birth defects Maternal Aunt   . Alcohol abuse Maternal Uncle   . Breast cancer Maternal Aunt     BP 112/61   Pulse 73   Ht '5\' 7"'  (1.702 m)   Wt 145 lb (65.8 kg)   BMI 22.71 kg/m   Review of Systems: See HPI above.     Objective:  Physical Exam:  Gen: NAD, comfortable in exam room  Right knee: Exam not repeated today. No gross deformity, ecchymoses, effusion. No TTP currently. FROM. Negative ant/post drawers. Negative valgus/varus testing. Negative lachmanns. Negative mcmurrays, apleys, patellar apprehension. NV intact distally.  Left knee; FROM without pain.   Assessment & Plan:  1. 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  s/p cortisone injection.  Third supartz given today - contact us in 1 month to let us know how she's doing - consider 4th, 5th injections.  Topical medications.  Home exercises.   Heat/ice.  Continue use of cane.    After informed written consent, patient was seated on exam table. Right knee was prepped with alcohol swab and utilizing anteromedial approach, patient's right knee was  injected intraarticularly with 57m bupivicaine followed by supartz. Patient tolerated the procedure well without immediate complications.

## 2016-12-10 NOTE — Assessment & Plan Note (Signed)
s/p cortisone injection.  Third supartz given today - contact us in 1 month to let us know how she's doing - consider 4th, 5th injections.  Topical medications.  Home exercises.   Heat/ice.  Continue use of cane.    After informed written consent, patient was seated on exam table. Right knee was prepped with alcohol swab and utilizing anteromedial approach, patient's right knee was injected intraarticularly with 16mL bupivicaine followed by supartz. Patient tolerated the procedure well without immediate complications.

## 2016-12-24 ENCOUNTER — Other Ambulatory Visit: Payer: Self-pay

## 2016-12-24 NOTE — Patient Outreach (Signed)
[facility]) Care Management  [place]  12/24/2016   Ashley Savage [birth year] [id number]   Telephone Assessment Subjective: Telephone call to patient for assessment follow up. HIPAA verified with patient. Patient confirmed receipt of [facility] care management welcome packet and EMMI education material . Patient stated she signed consent form and mailed back to [facility] care management.  Patient reports overall she is doing pretty good. Patient denies any new symptoms regarding her fatty liver disease . Patient states she continues to take her medication as prescribed. Patient state if she starts to notice signs of confusion she takes an extra dose of lactulose as advised by her gastroenterologist.  RNCM reviewed EMMI education material on NASH/ fatty liver disease with patient. Discussed signs/ symptoms to look for  And when to notify doctor, Patient aware to keep blood sugars under contol. Patient reports her A1c was in the 5 range. States her doctor decrease her insulin dose. Patient states she is taking her medications as directed.  Patient states she does not have a follow up appointment with Dr. Silverio Decamp.  States she sees him as needed. RNCM advised patient to call an report any symptoms as indicated. Patient verbalized understanding.   Patient states she saw her orthopedic doctor on 12/10/16.  Patient states she received the gel shots.  States she feels like the shots are helping some. Patient reports today's pain level at a 4. Patient states she is wearing the brace that was given to her by the orthopedic doctor.  Patient states she had a follow up visit with her pulmonologist, Dr. Elsworth Soho on 12/10/16.  Denies any change in her treatment plan. Patient continues to smoke.  Advised as per her pulmonary doctor to decrease cigarettes. Patient does use e-cigarettes as well. RNCM will send patient EMMI education material on smoking cessation. Patient states due to the humidity and pollen she  will have some trouble with her breathing. Patient states she takes her controller medication, Spiriva daily and uses her nebulizer as needed.  Patient reports she has a follow up appointment with her oncologist on 01/05/17.  Patient states she is 7 years post breast cancer.  Patient verbally agreed to next telephone outreach with [facility] .   Medication list reviewed with patient 12/24/16 Objective: n/a  Encounter Medications:  Outpatient Encounter Prescriptions as of 12/24/2016  Medication Sig Note  . albuterol (ACCUNEB) 1.25 MG/3ML nebulizer solution Take 3 mLs (1.25 mg total) by nebulization every 6 (six) hours as needed for wheezing.   Marland Kitchen albuterol (PROVENTIL HFA;VENTOLIN HFA) 108 (90 Base) MCG/ACT inhaler Inhale 2 puffs into the lungs every 6 (six) hours as needed for wheezing or shortness of breath. Only dispense Ventolin   . BD PEN NEEDLE NANO U/F 32G X 4 MM MISC USE AS DIRECTED WITH LEVEMIR FLEXPEN   . Blood Glucose Monitoring Suppl (ONE TOUCH ULTRA 2) w/Device KIT Use as directed once daily to check blood sugar.  DX E11.9   . Cholecalciferol (VITAMIN D3) 2000 units TABS Take 2,000 Units by mouth every morning.   . fluticasone (FLONASE) 50 MCG/ACT nasal spray Place 2 sprays into both nostrils daily. (Patient taking differently: Place 2 sprays into both nostrils daily as needed for allergies. )   . furosemide (LASIX) 20 MG tablet TAKE 1 TABLET BY MOUTH TWICE DAILY (Patient taking differently: TAKE 1 TABLET BY MOUTH TWICE DAILY PRN FOR SWELLING) 12/24/2016: Patient takes differently. Patient states she takes tablet 1 time per day.   . Insulin Syringe-Needle  U-100 31G X 5/16" 1 ML MISC To use w/ Novolog   . lactulose (CHRONULAC) 10 GM/15ML solution TAKE 45 MLS BY MOUTH TWICE DAILY AS NEEDED FOR MILD CONSTIPATION. 12/24/2016: Patient takes differently. Takes once daily as directed by her doctor  . LEVEMIR FLEXTOUCH 100 UNIT/ML Pen INJECT 14 UNITS UNDER THE SKIN EVERY DAY AT 10PM 12/24/2016: Patient  states she is taking 10 units per day as directed by her primary MD  . montelukast (SINGULAIR) 10 MG tablet Take 1 tablet (10 mg total) by mouth daily as needed. (Patient taking differently: Take 10 mg by mouth daily as needed (allergies). )   . ONE TOUCH ULTRA TEST test strip USE TWICE DAILY TO CHECK BLOOD SUGAR AS DIRECTED   . rifaximin (XIFAXAN) 550 MG TABS tablet Take 1 tablet (550 mg total) by mouth 2 (two) times daily.   Marland Kitchen spironolactone (ALDACTONE) 50 MG tablet TAKE 2 TABLETS(100 MG) BY MOUTH DAILY (Patient taking differently: TAKE 2 TABLETS(100 MG) BY MOUTH DAILY PRN FOR FLUID)   . cetirizine (ZYRTEC) 10 MG tablet Take 10 mg by mouth daily as needed for allergies.    . Polyvinyl Alcohol (LUBRICANT DROPS OP) Apply 1 drop to eye daily as needed (dry eyes).   . sodium chloride (OCEAN) 0.65 % SOLN nasal spray Place 1 spray into both nostrils as needed for congestion. (Patient not taking: Reported on 12/24/2016)   . tiZANidine (ZANAFLEX) 4 MG tablet Take 1 tablet (4 mg total) by mouth every 8 (eight) hours as needed for muscle spasms. (Patient not taking: Reported on 12/24/2016)   . traMADol (ULTRAM-ER) 100 MG 24 hr tablet     No facility-administered encounter medications on file as of 12/24/2016.     Functional Status:  In your present state of health, do you have any difficulty performing the following activities: 12/07/2016 11/25/2016  Hearing? Y N  Vision? N N  Difficulty concentrating or making decisions? N Y  Walking or climbing stairs? Y Y  Dressing or bathing? N Y  Doing errands, shopping? N Y  Conservation officer, nature and eating ? N -  Using the Toilet? N -  In the past six months, have you accidently leaked urine? Y -  Do you have problems with loss of bowel control? N -  Managing your Medications? N -  Managing your Finances? N -  Housekeeping or managing your Housekeeping? N -  Some recent data might be hidden    Fall/Depression Screening: Fall Risk  12/07/2016 10/22/2016 05/04/2016   Falls in the past year? Yes Yes Yes  Number falls in past yr: '1 1 2 ' or more  Injury with Fall? Yes Yes No  Risk Factor Category  High Fall Risk - -  Risk for fall due to : History of fall(s) Impaired balance/gait;History of fall(s) Impaired balance/gait  Risk for fall due to (comments): - - Has an arthritic right knee  Follow up Falls prevention discussed Education provided;Falls prevention discussed Falls prevention discussed   PHQ 2/9 Scores 11/20/2016 10/22/2016 05/04/2016 02/25/2016 02/08/2015 02/08/2015 12/26/2014  PHQ - 2 Score 0 1 0 0 1 0 0    Assessment:  Patient needs ongoing education regarding NASH/ fatty liver disease and smoking cessation. Pain management for right knee ongoing.   Plan: RNCM will follow up with patient within the month of June 2018. Patient will report receiving EMMI education material for smoking cessation.  Patient will report 3 ways of managing her fatty liver disease at next telephone outreach with [facility]  [name] RN,BSN,CCM [facility] Telephonic  [phone]

## 2016-12-25 ENCOUNTER — Other Ambulatory Visit: Payer: Self-pay | Admitting: Family Medicine

## 2016-12-25 ENCOUNTER — Other Ambulatory Visit: Payer: Self-pay | Admitting: Gastroenterology

## 2016-12-25 NOTE — Addendum Note (Signed)
Addendum  created 12/25/16 0945 by Rica Koyanagi, MD   Sign clinical note

## 2017-01-03 ENCOUNTER — Encounter (HOSPITAL_BASED_OUTPATIENT_CLINIC_OR_DEPARTMENT_OTHER): Payer: Self-pay | Admitting: Emergency Medicine

## 2017-01-03 ENCOUNTER — Emergency Department (HOSPITAL_BASED_OUTPATIENT_CLINIC_OR_DEPARTMENT_OTHER)
Admission: EM | Admit: 2017-01-03 | Discharge: 2017-01-03 | Disposition: A | Discharge status: Home / Self Care | Payer: PPO | Attending: Emergency Medicine | Admitting: Emergency Medicine

## 2017-01-03 ENCOUNTER — Encounter: Payer: Self-pay | Admitting: Family Medicine

## 2017-01-03 DIAGNOSIS — Y999 Unspecified external cause status: Secondary | ICD-10-CM | POA: Insufficient documentation

## 2017-01-03 DIAGNOSIS — R2241 Localized swelling, mass and lump, right lower limb: Secondary | ICD-10-CM | POA: Insufficient documentation

## 2017-01-03 DIAGNOSIS — R600 Localized edema: Secondary | ICD-10-CM

## 2017-01-03 DIAGNOSIS — Z794 Long term (current) use of insulin: Secondary | ICD-10-CM | POA: Insufficient documentation

## 2017-01-03 DIAGNOSIS — Y9301 Activity, walking, marching and hiking: Secondary | ICD-10-CM | POA: Diagnosis not present

## 2017-01-03 DIAGNOSIS — E119 Type 2 diabetes mellitus without complications: Secondary | ICD-10-CM | POA: Diagnosis not present

## 2017-01-03 DIAGNOSIS — S81801A Unspecified open wound, right lower leg, initial encounter: Secondary | ICD-10-CM | POA: Diagnosis not present

## 2017-01-03 DIAGNOSIS — Y929 Unspecified place or not applicable: Secondary | ICD-10-CM | POA: Insufficient documentation

## 2017-01-03 DIAGNOSIS — J449 Chronic obstructive pulmonary disease, unspecified: Secondary | ICD-10-CM | POA: Insufficient documentation

## 2017-01-03 DIAGNOSIS — X503XXA Overexertion from repetitive movements, initial encounter: Secondary | ICD-10-CM | POA: Diagnosis not present

## 2017-01-03 NOTE — ED Provider Notes (Signed)
[facility] DEPT [facility] Provider Note   CSN: [id number] Arrival date & time: 01/03/17  1258     History   Chief Complaint Chief Complaint  Patient presents with  . Wound Check    HPI Ashley Savage is a 76 y.o. female.  76 yo F with a chief complaints of right leg weeping. The patient has had this off and on for years. Usually related to fluid overload. Normally Lasix and spironolactone improve her symptoms. The patient has not been taking her Lasix as often as normal because she is having some urinary incontinence. It is made her quality of life worse. She tried to put on compression stockings today however she is concerned about the amount of weeping. Decided to come to the ED for a better dressing. Denies shortness breath.    The history is provided by the patient.  Illness  This is a new problem. The current episode started 2 days ago. The problem occurs constantly. The problem has been gradually worsening. Pertinent negatives include no chest pain, no headaches and no shortness of breath. Nothing aggravates the symptoms. Nothing relieves the symptoms. She has tried nothing for the symptoms. The treatment provided no relief.    Past Medical History:  Diagnosis Date  . Allergic state 11/10/2016  . Anxiety   . Arthritis of both knees 10/01/2013  . Benign paroxysmal positional vertigo 10/01/2013  . Cancer [facility]) breast ca  right  . COPD (chronic obstructive pulmonary disease) ([place]) 10/01/2013  . Depression   . Diabetes mellitus type 2  . Emphysema   . Encephalopathy, hepatic ([place]) 06/07/2014  . Esophageal reflux 10/01/2013  . Fall 07/30/2016  . Hyperlipidemia   . Hyperlipidemia, mixed   . Increased ammonia level 11/25/2014  . NASH (nonalcoholic steatohepatitis) 08/26/2015  . Neck pain 10/01/2013  . Neuropathy    feet   . Overactive bladder 12/10/2013  . Panic attacks   . Pedal edema 12/10/2013  . Personal history of radiation therapy   . Preventative health care 03/08/2016  . Tobacco  abuse disorder 02/01/2014    Patient Active Problem List   Diagnosis Date Noted  . Allergic state 11/10/2016  . 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 Portal hypertensive gastropathy ([place])   . Lower back injury, initial encounter 08/05/2016  . Fall 07/30/2016  . Preventative health care 03/08/2016  . Muscle spasm 02/25/2016  . Chronic respiratory failure ([place]) 08/29/2015  . NASH (nonalcoholic steatohepatitis) 08/26/2015  . Type 2 diabetes mellitus with hyperglycemia, with long-term current use of insulin ([place])   . Liver cirrhosis secondary to NASH ([place]) 07/31/2015  . Diarrhea 12/30/2014  . Increased ammonia level 11/25/2014  . Diabetes mellitus type 2, controlled ([place]) 10/16/2014  . Superficial bruising 10/04/2014  . Encephalopathy, hepatic ([place]) 06/07/2014  . Right knee pain 06/07/2014  . Peripheral edema 03/30/2014  . Sun-damaged skin 02/01/2014  . Anxiety and depression 02/01/2014  . Tobacco abuse disorder 02/01/2014  . Medicare annual wellness visit, subsequent 02/01/2014  . Pedal edema 12/10/2013  . Overactive bladder 12/10/2013  . Abdominal aortic aneurysm ([place]) 10/01/2013  . Arthritis of right knee 10/01/2013  . Benign paroxysmal positional vertigo 10/01/2013  . Neck pain 10/01/2013  . Esophageal reflux 10/01/2013  . Thrombocytopenia ([place]) 03/17/2012  . 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   . Hyperlipidemia, mixed   . Breast cancer [facility])     Past Surgical History:  Procedure Laterality Date  . APPENDECTOMY  2007  . BREAST  SURGERY  2009 right  . CATARACT EXTRACTION     x 2  . ESOPHAGOGASTRODUODENOSCOPY (EGD) WITH PROPOFOL N/A 08/14/2016   Procedure: ESOPHAGOGASTRODUODENOSCOPY (EGD) WITH PROPOFOL;  Surgeon: Mauri Pole, MD;  Location: [facility] ENDOSCOPY;  Service: Endoscopy;  Laterality: N/A;  . [place]  . KNEE SURGERY    . MANDIBLE FRACTURE SURGERY    . MASTECTOMY    . PILONIDAL CYST EXCISION    .  TONSILLECTOMY      OB History    No data available       Home Medications    Prior to Admission medications   Medication Sig Start Date End Date Taking? Authorizing Provider  albuterol (ACCUNEB) 1.25 MG/3ML nebulizer solution Take 3 mLs (1.25 mg total) by nebulization every 6 (six) hours as needed for wheezing. 12/10/16   Rigoberto Noel, MD  albuterol (PROVENTIL HFA;VENTOLIN HFA) 108 (90 Base) MCG/ACT inhaler Inhale 2 puffs into the lungs every 6 (six) hours as needed for wheezing or shortness of breath. Only dispense Ventolin 02/25/16   Mosie Lukes, MD  BD PEN NEEDLE NANO U/F 32G X 4 MM MISC USE AS DIRECTED WITH LEVEMIR FLEXPEN 08/17/16   Mosie Lukes, MD  Blood Glucose Monitoring Suppl (ONE TOUCH ULTRA 2) w/Device KIT Use as directed once daily to check blood sugar.  DX E11.9 09/14/16   Mosie Lukes, MD  cetirizine (ZYRTEC) 10 MG tablet Take 10 mg by mouth daily as needed for allergies.     [provider]  Cholecalciferol (VITAMIN D3) 2000 units TABS Take 2,000 Units by mouth every morning.    [provider]  fluticasone (FLONASE) 50 MCG/ACT nasal spray Place 2 sprays into both nostrils daily. Patient taking differently: Place 2 sprays into both nostrils daily as needed for allergies.  11/10/16   Mosie Lukes, MD  furosemide (LASIX) 20 MG tablet TAKE 1 TABLET BY MOUTH TWICE DAILY Patient taking differently: TAKE 1 TABLET BY MOUTH TWICE DAILY PRN FOR SWELLING 08/14/16   Mauri Pole, MD  Insulin Syringe-Needle U-100 31G X 5/16" 1 ML MISC To use w/ Novolog 07/24/16   Mosie Lukes, MD  lactulose (CHRONULAC) 10 GM/15ML solution TAKE 45 MLS BY MOUTH TWICE DAILY AS NEEDED FOR MILD CONSTIPATION. 03/02/16   Mosie Lukes, MD  LEVEMIR FLEXTOUCH 100 UNIT/ML Pen INJECT 14 UNITS UNDER THE SKIN EVERY DAY AT 10PM 12/25/16   Mosie Lukes, MD  montelukast (SINGULAIR) 10 MG tablet Take 1 tablet (10 mg total) by mouth daily as needed. Patient taking differently: Take  10 mg by mouth daily as needed (allergies).  11/10/16   Mosie Lukes, MD  ONE TOUCH ULTRA TEST test strip USE TWICE DAILY TO CHECK BLOOD SUGAR AS DIRECTED 12/03/15   Mosie Lukes, MD  Polyvinyl Alcohol (LUBRICANT DROPS OP) Apply 1 drop to eye daily as needed (dry eyes).    [provider]  rifaximin (XIFAXAN) 550 MG TABS tablet Take 1 tablet (550 mg total) by mouth 2 (two) times daily. 09/04/16   Esterwood, Amy S, PA-C  sodium chloride (OCEAN) 0.65 % SOLN nasal spray Place 1 spray into both nostrils as needed for congestion. Patient not taking: Reported on 12/24/2016 08/01/15   Barton Dubois, MD  spironolactone (ALDACTONE) 50 MG tablet TAKE 2 TABLETS(100 MG) BY MOUTH DAILY 12/25/16   Nandigam, Venia Minks, MD  tiZANidine (ZANAFLEX) 4 MG tablet Take 1 tablet (4 mg total) by mouth every 8 (eight) hours  as needed for muscle spasms. Patient not taking: Reported on 12/24/2016 07/24/16   Mosie Lukes, MD  traMADol Juliette Alcide) 100 MG 24 hr tablet  09/14/16   [provider]    Family History Family History  Problem Relation Age of Onset  . Heart failure Father   . COPD Father   . Arthritis Father 4  . Stroke Mother   . Arthritis Mother 29  . Hyperlipidemia Mother   . Hypertension Mother   . Diabetes Mother   . Diabetes Sister   . Breast cancer Unknown   . Breast cancer Maternal Aunt   . Asthma Maternal Aunt   . Birth defects Maternal Aunt   . Alcohol abuse Maternal Uncle   . Breast cancer Maternal Aunt     Social History Social History  Substance Use Topics  . Smoking status: Current Some Day Smoker    Packs/day: 0.25    Years: 58.00    Types: Cigarettes    Start date: 07/27/1964  . Smokeless tobacco: Never Used     Comment: 1 pack per week, tobacco infor given 12/30/15  . Alcohol use No     Allergies   Citalopram; Erythromycin; Glimepiride; Prednisone; and Versed [midazolam]   Review of Systems Review of Systems  Constitutional: Negative for chills and fever.    HENT: Negative for congestion and rhinorrhea.   Eyes: Negative for redness and visual disturbance.  Respiratory: Negative for shortness of breath and wheezing.   Cardiovascular: Positive for leg swelling. Negative for chest pain and palpitations.  Gastrointestinal: Negative for nausea and vomiting.  Genitourinary: Negative for dysuria and urgency.  Musculoskeletal: Negative for arthralgias and myalgias.  Skin: Positive for wound. Negative for pallor.  Neurological: Negative for dizziness and headaches.     Physical Exam Updated Vital Signs BP 115/61   Pulse 88   Temp 98.3 F (36.8 C) (Oral)   Resp (!) 24   Ht '5\' 7"'  (1.702 m)   Wt 65.8 kg (145 lb)   SpO2 94%   BMI 22.71 kg/m   Physical Exam  Constitutional: She is oriented to person, place, and time. She appears well-developed and well-nourished. No distress.  HENT:  Head: Normocephalic and atraumatic.  Eyes: EOM are normal. Pupils are equal, round, and reactive to light.  Neck: Normal range of motion. Neck supple.  Cardiovascular: Normal rate and regular rhythm.  Exam reveals no gallop and no friction rub.   No murmur heard. Pulmonary/Chest: Effort normal. She has wheezes (scattered with prolonged expiration). She has no rales.  Abdominal: Soft. She exhibits no distension and no mass. There is no tenderness. There is no guarding.  Musculoskeletal: She exhibits edema (bilateral, L >R.  Small weeping from posterior aspect of right leg). She exhibits no tenderness.  No warmth, non tender  Neurological: She is alert and oriented to person, place, and time.  Skin: Skin is warm and dry. She is not diaphoretic.  Psychiatric: She has a normal mood and affect. Her behavior is normal.  Nursing note and vitals reviewed.    ED Treatments / Results  Labs (all labs ordered are listed, but only abnormal results are displayed) Labs Reviewed - No data to display  EKG  EKG Interpretation None       Radiology No results  found.  Procedures Procedures (including critical care time)  Medications Ordered in ED Medications - No data to display   Initial Impression / Assessment and Plan / ED Course  I have reviewed the  triage vital signs and the nursing notes.  Pertinent labs & imaging results that were available during my care of the patient were reviewed by me and considered in my medical decision making (see chart for details).     77 yo F With a chief complaints of weeping from the punctate wound to the right lower extremity. I offered a laboratory evaluation to evaluate the reason for her Lasix not working however it sounds mostly like intolerance to therapy due to urinary incontinence. The patient is declining labs at this time stating she will have labs drawn in 2 days time. She is here solely for a new dressing. We'll place at bedside. PCP follow-up.  1:32 PM:  I have discussed the diagnosis/risks/treatment options with the patient and believe the pt to be eligible for discharge home to follow-up with PCP. We also discussed returning to the ED immediately if new or worsening sx occur. We discussed the sx which are most concerning (e.g., sudden worsening pain, fever, inability to tolerate by mouth) that necessitate immediate return. Medications administered to the patient during their visit and any new prescriptions provided to the patient are listed below.  Medications given during this visit Medications - No data to display   The patient appears reasonably screen and/or stabilized for discharge and I doubt any other medical condition or other [facility] requiring further screening, evaluation, or treatment in the ED at this time prior to discharge.    Final Clinical Impressions(s) / ED Diagnoses   Final diagnoses:  Lower extremity edema    New Prescriptions New Prescriptions   No medications on file     Deno Etienne, DO 01/03/17 1333

## 2017-01-03 NOTE — ED Triage Notes (Signed)
Pt reports an recurrent bump on the back of her right lower leg that "weeps on and off". Denies pain or fever. Hx of DM.

## 2017-01-04 ENCOUNTER — Other Ambulatory Visit: Payer: Self-pay

## 2017-01-05 ENCOUNTER — Ambulatory Visit (HOSPITAL_BASED_OUTPATIENT_CLINIC_OR_DEPARTMENT_OTHER): Payer: PPO | Admitting: Hematology & Oncology

## 2017-01-05 ENCOUNTER — Other Ambulatory Visit (HOSPITAL_BASED_OUTPATIENT_CLINIC_OR_DEPARTMENT_OTHER): Payer: PPO

## 2017-01-05 ENCOUNTER — Encounter: Payer: Self-pay | Admitting: Physician Assistant

## 2017-01-05 VITALS — BP 133/52 | HR 94 | Temp 97.6°F | Resp 19 | Wt 140.0 lb

## 2017-01-05 DIAGNOSIS — Z853 Personal history of malignant neoplasm of breast: Secondary | ICD-10-CM

## 2017-01-05 DIAGNOSIS — C50019 Malignant neoplasm of nipple and areola, unspecified female breast: Secondary | ICD-10-CM

## 2017-01-05 DIAGNOSIS — D696 Thrombocytopenia, unspecified: Secondary | ICD-10-CM

## 2017-01-05 DIAGNOSIS — K7460 Unspecified cirrhosis of liver: Secondary | ICD-10-CM

## 2017-01-05 DIAGNOSIS — K7581 Nonalcoholic steatohepatitis (NASH): Principal | ICD-10-CM

## 2017-01-05 LAB — CBC WITH DIFFERENTIAL (CANCER CENTER ONLY)
BASO#: 0.1 10*3/uL (ref 0.0–0.2)
BASO%: 1.3 % (ref 0.0–2.0)
EOS%: 2.3 % (ref 0.0–7.0)
Eosinophils Absolute: 0.1 10*3/uL (ref 0.0–0.5)
HEMATOCRIT: 46.1 % (ref 34.8–46.6)
HGB: 16.2 g/dL — ABNORMAL HIGH (ref 11.6–15.9)
LYMPH#: 1.5 10*3/uL (ref 0.9–3.3)
LYMPH%: 24.2 % (ref 14.0–48.0)
MCH: 33.5 pg (ref 26.0–34.0)
MCHC: 35.1 g/dL (ref 32.0–36.0)
MCV: 95 fL (ref 81–101)
MONO#: 1 10*3/uL — ABNORMAL HIGH (ref 0.1–0.9)
MONO%: 15.5 % — AB (ref 0.0–13.0)
NEUT#: 3.5 10*3/uL (ref 1.5–6.5)
NEUT%: 56.7 % (ref 39.6–80.0)
PLATELETS: 115 10*3/uL — AB (ref 145–400)
RBC: 4.83 10*6/uL (ref 3.70–5.32)
RDW: 13.9 % (ref 11.1–15.7)
WBC: 6.2 10*3/uL (ref 3.9–10.0)

## 2017-01-05 LAB — CMP (CANCER CENTER ONLY)
ALBUMIN: 3.3 g/dL (ref 3.3–5.5)
ALT(SGPT): 38 U/L (ref 10–47)
AST: 46 U/L — AB (ref 11–38)
Alkaline Phosphatase: 163 U/L — ABNORMAL HIGH (ref 26–84)
BILIRUBIN TOTAL: 3.1 mg/dL — AB (ref 0.20–1.60)
BUN: 16 mg/dL (ref 7–22)
CO2: 30 meq/L (ref 18–33)
Calcium: 9.8 mg/dL (ref 8.0–10.3)
Chloride: 100 mEq/L (ref 98–108)
Creat: 1 mg/dl (ref 0.6–1.2)
GLUCOSE: 159 mg/dL — AB (ref 73–118)
Potassium: 4.3 mEq/L (ref 3.3–4.7)
SODIUM: 137 meq/L (ref 128–145)
Total Protein: 6.1 g/dL — ABNORMAL LOW (ref 6.4–8.1)

## 2017-01-05 LAB — LACTATE DEHYDROGENASE: LDH: 285 U/L — AB (ref 125–245)

## 2017-01-05 NOTE — Progress Notes (Signed)
Hematology and Oncology Follow Up Visit  Ashley Savage [id number] 07-Jul-1941 76 y.o. 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 She mostly easily with cirrhosis now. She has NASH. She sees gastroenterology for this.  She has some edema in her legs. She has a bandage and gauze around the back of her right leg. She has a lot of oozing from this area.  Of note, there was some concern that there may be a malignancy with her left breast. She did undergo a biopsy back in late March. The pathology report ([id number]) only showed sclerosing adenosis. There is no evidence of malignancy. Some ductal hyperplasia was also appreciated.  She is still smoking.  Her weight continues to go down. I think this is clearly a reflection of her hepatic insufficiency.  She's had no obvious bleeding. She does have varices.  There's been no rashes.  Overall, her performance status is ECOG 3.  Medications:  Allergies as of 01/05/2017      Reactions   Citalopram Palpitations   Irregular heart beat   Erythromycin Other (See Comments)   Stomach cramps   Glimepiride    Elevated ammonia levels   Prednisone    Increased blood sugars too high   Versed [midazolam] Other (See Comments)   Patient stayed confusion stayed 4+days       Medication List       Accurate as of 01/05/17 10:52 AM. Always use your most recent med list.          albuterol 108 (90 Base) MCG/ACT inhaler Commonly known as:  PROVENTIL HFA;VENTOLIN HFA Inhale 2 puffs into the lungs every 6 (six) hours as needed for wheezing or shortness of breath. Only dispense Ventolin   albuterol 1.25 MG/3ML nebulizer solution Commonly known as:  ACCUNEB Take 3 mLs (1.25 mg total) by nebulization every  6 (six) hours as needed for wheezing.   BD PEN NEEDLE NANO U/F 32G X 4 MM Misc Generic drug:  Insulin Pen Needle USE AS DIRECTED WITH LEVEMIR FLEXPEN   cefpodoxime 200 MG tablet Commonly known as:  VANTIN   cetirizine 10 MG tablet Commonly known as:  ZYRTEC Take 10 mg by mouth daily as needed for allergies.   fluticasone 50 MCG/ACT nasal spray Commonly known as:  FLONASE Place 2 sprays into both nostrils daily.   furosemide 20 MG tablet Commonly known as:  LASIX TAKE 1 TABLET BY MOUTH TWICE DAILY   Insulin Syringe-Needle U-100 31G X 5/16" 1 ML Misc To use w/ Novolog   lactulose 10 GM/15ML solution Commonly known as:  CHRONULAC TAKE 45 MLS BY MOUTH TWICE DAILY AS NEEDED FOR MILD CONSTIPATION.   LEVEMIR FLEXTOUCH 100 UNIT/ML Pen Generic drug:  Insulin Detemir INJECT 14 UNITS UNDER THE SKIN EVERY DAY AT 10PM   LUBRICANT DROPS OP Apply 1 drop to eye daily as needed (dry eyes).   montelukast 10 MG tablet Commonly known as:  SINGULAIR Take 1 tablet (10 mg total) by mouth daily as needed.   ONE TOUCH ULTRA 2 w/Device Kit Use as directed once daily to check blood sugar.  DX E11.9   ONE TOUCH ULTRA TEST test strip Generic drug:  glucose blood USE TWICE DAILY TO CHECK BLOOD SUGAR AS DIRECTED   rifaximin 550 MG  Tabs tablet Commonly known as:  XIFAXAN Take 1 tablet (550 mg total) by mouth 2 (two) times daily.   sodium chloride 0.65 % Soln nasal spray Commonly known as:  OCEAN Place 1 spray into both nostrils as needed for congestion.   spironolactone 50 MG tablet Commonly known as:  ALDACTONE TAKE 2 TABLETS(100 MG) BY MOUTH DAILY   tiZANidine 4 MG tablet Commonly known as:  ZANAFLEX Take 1 tablet (4 mg total) by mouth every 8 (eight) hours as needed for muscle spasms.   traMADol 100 MG 24 hr tablet Commonly known as:  ULTRAM-ER   Vitamin D3 2000 units Tabs Take 2,000 Units by mouth every morning.       Allergies:  Allergies  Allergen Reactions  .  Citalopram Palpitations    Irregular heart beat  . Erythromycin Other (See Comments)    Stomach cramps  . Glimepiride     Elevated ammonia levels  . Prednisone     Increased blood sugars too high  . Versed [Midazolam] Other (See Comments)    Patient stayed confusion stayed 4+days     Past Medical History, Surgical history, Social history, and Family History were reviewed and updated.  Review of Systems: All other 10 point review of systems is negative.   Physical Exam:  weight is 140 lb (63.5 kg). Her oral temperature is 97.6 F (36.4 C). Her blood pressure is 133/52 (abnormal) and her pulse is 94. Her respiration is 19 and oxygen saturation is 96%.   Wt Readings from Last 3 Encounters:  01/05/17 140 lb (63.5 kg)  01/03/17 145 lb (65.8 kg)  12/10/16 151 lb (68.5 kg)    Ocular: Sclerae unicteric, pupils equal, round and reactive to light Ear-nose-throat: Oropharynx clear, dentition fair Lymphatic: No cervical supraclavicular or axillary adenopathy Lungs clear throughout, no rales or rhonchi, good excursion bilaterally Heart regular rate and rhythm, no murmur appreciated Abd soft, nontender, positive bowel sounds, no liver or spleen tip palpated on exam, no fluid wave MSK no focal spinal tenderness, no joint edema Neuro: non-focal, well-oriented, appropriate affect Breasts: She has thickening around the areola of the left breast. No erythema, mass or rash noted. No change with the right chest s/p mastectomy.    Lab Results  Component Value Date   WBC 6.2 01/05/2017   HGB 16.2 (H) 01/05/2017   HCT 46.1 01/05/2017   MCV 95 01/05/2017   PLT 115 (L) 01/05/2017   Lab Results  Component Value Date   FERRITIN 16 07/31/2015   IRON 54 07/31/2015   TIBC 344 07/31/2015   UIBC 290 07/31/2015   IRONPCTSAT 16 07/31/2015   Lab Results  Component Value Date   RBC 4.83 01/05/2017   No results found for: KPAFRELGTCHN, LAMBDASER, KAPLAMBRATIO Lab Results  Component Value Date    IGGSERUM TEST NOT PERFORMED 01/24/2015   IGGSERUM 835 01/24/2015   No results found for: Ronnald Ramp, A1GS, A2GS, Tillman Sers, SPEI   Chemistry      Component Value Date/Time   NA 137 01/05/2017 1031   NA 139 06/29/2016 1128   K 4.3 01/05/2017 1031   K 5.0 06/29/2016 1128   CL 100 01/05/2017 1031   CO2 30 01/05/2017 1031   CO2 24 06/29/2016 1128   BUN 16 01/05/2017 1031   BUN 15.9 06/29/2016 1128   CREATININE 1.0 01/05/2017 1031   CREATININE 1.1 06/29/2016 1128      Component Value Date/Time   CALCIUM 9.8 01/05/2017 1031   CALCIUM  9.0 06/29/2016 1128   ALKPHOS 163 (H) 01/05/2017 1031   ALKPHOS 129 06/29/2016 1128   AST 46 (H) 01/05/2017 1031   AST 36 (H) 06/29/2016 1128   ALT 38 01/05/2017 1031   ALT 24 06/29/2016 1128   BILITOT 3.10 (H) 01/05/2017 1031   BILITOT 1.78 (H) 06/29/2016 1128     Impression and Plan: She is a 76 yo white female with history of 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 Her tumor was ER positive and she completed 5 years of Femara in February 2015. She has some thickening still around the left areaola but no longer has erythema and no pain.   For right now, we'll plan to get her back in 6 months. I think this would be reasonable.   Her prognosis, at this point, is clearly based upon her liver function.       Volanda Napoleon, MD 6/12/201810:52 AM

## 2017-01-05 NOTE — Patient Outreach (Signed)
[facility]) Care Management  01/05/2017  Ashley Savage 1941-06-28 [id number]  Late entry 01/04/17 Nurse call line Nurse call line date; 01/03/17 REFERRAL REASON; weeping legs, lower extremity edema Nurse call line recommendation: see physician within 4 hours [facility] care management referral date: 01/05/17  SUBJECTIVE: Telephone call to patient regarding nurse call line referral. HIPAA verified with patient.  Patient states she went to the emergency room because her legs were weeping and swollen. Patient states she changed the wrapping today to a light weight wrap. Patient states her legs are not weeping like they were.  States the wrap is damp.  Patient states the swelling has decreased in her legs. RNCM advised patient to elevate legs as much as possible. Patients states she will also reapply her support hose. Patient advised to follow up with doctor for symptoms/ concerns. Patient instructed to call 911 for severe/ emergent symptoms.    PLAN: RNCM will follow up with patient at next scheduled outreach.   [name] RN,BSN,CCM [facility] Telephonic  [phone]

## 2017-01-05 NOTE — Telephone Encounter (Signed)
This encounter was created in error - please disregard.

## 2017-01-07 ENCOUNTER — Ambulatory Visit: Payer: PPO | Admitting: *Deleted

## 2017-01-07 ENCOUNTER — Ambulatory Visit (INDEPENDENT_AMBULATORY_CARE_PROVIDER_SITE_OTHER): Payer: PPO | Admitting: Family Medicine

## 2017-01-07 ENCOUNTER — Encounter: Payer: Self-pay | Admitting: Family Medicine

## 2017-01-07 VITALS — BP 102/58 | HR 89 | Temp 97.7°F | Resp 18 | Wt 141.8 lb

## 2017-01-07 DIAGNOSIS — K7581 Nonalcoholic steatohepatitis (NASH): Secondary | ICD-10-CM

## 2017-01-07 DIAGNOSIS — E1165 Type 2 diabetes mellitus with hyperglycemia: Secondary | ICD-10-CM

## 2017-01-07 DIAGNOSIS — Z720 Tobacco use: Secondary | ICD-10-CM

## 2017-01-07 DIAGNOSIS — R600 Localized edema: Secondary | ICD-10-CM

## 2017-01-07 DIAGNOSIS — R109 Unspecified abdominal pain: Secondary | ICD-10-CM | POA: Diagnosis not present

## 2017-01-07 DIAGNOSIS — N3281 Overactive bladder: Secondary | ICD-10-CM

## 2017-01-07 DIAGNOSIS — R32 Unspecified urinary incontinence: Secondary | ICD-10-CM | POA: Diagnosis not present

## 2017-01-07 DIAGNOSIS — Z794 Long term (current) use of insulin: Secondary | ICD-10-CM

## 2017-01-07 DIAGNOSIS — Z87440 Personal history of urinary (tract) infections: Secondary | ICD-10-CM | POA: Diagnosis not present

## 2017-01-07 MED ORDER — MUPIROCIN 2 % EX OINT: 1.0000 "application " | TOPICAL_OINTMENT | Freq: Two times a day (BID) | CUTANEOUS | 1 refills | Status: DC

## 2017-01-07 NOTE — Progress Notes (Signed)
Subjective:  I acted as a Education administrator for Dr. Charlett Blake. [name], [place]  [name], female    DOB: 1941-07-26, 76 y.o.   MRN: [id number]  Chief Complaint  Patient presents with  . Hospitalization Follow-up    lower extremity edema     HPI  Patient is in today for an ER follow up for lower extremity edema right leg. Patient states she is just tired.  She was seen in the ER for edema which while persistent has improved. She denies any trauma or injury. She denies any chest pain, palpitations or shortness of breath. She acknowledges she had some mild confusion at that time but notes it is improved. No recent febrile illness. She continues to take her medications as prescribed. Denies CP/palp/SOB/HA/congestion/fevers/GI. She is frustrated with urinary urgency and incontinence. No dysuria or hematuria.   Patient Care Team: Mosie Lukes, MD as PCP - General (Family Medicine) Dene Gentry, MD as Consulting Physician (Sports Medicine) Mauri Pole, MD as Consulting Physician (Gastroenterology) Rigoberto Noel, MD as Consulting Physician (Pulmonary Disease) Parrett, Fonnie Mu, NP as Nurse Practitioner (Pulmonary Disease) Volanda Napoleon, MD as Consulting Physician (Oncology) Dannielle Karvonen, RN as [place] [facility]   Past Medical History:  Diagnosis Date  . Allergic state 11/10/2016  . Anxiety   . Arthritis of both knees 10/01/2013  . Benign paroxysmal positional vertigo 10/01/2013  . Cancer [facility]) breast ca  right  . COPD (chronic obstructive pulmonary disease) ([place]) 10/01/2013  . Depression   . Diabetes mellitus type 2  . Emphysema   . Encephalopathy, hepatic ([place]) 06/07/2014  . Esophageal reflux 10/01/2013  . Fall 07/30/2016  . Hyperlipidemia   . Hyperlipidemia, mixed   . Increased ammonia level 11/25/2014  . NASH (nonalcoholic steatohepatitis) 08/26/2015  . Neck pain 10/01/2013  . Neuropathy    feet   . Overactive bladder 12/10/2013  . Panic attacks     . Pedal edema 12/10/2013  . Personal history of radiation therapy   . Preventative health care 03/08/2016  . Tobacco abuse disorder 02/01/2014    Past Surgical History:  Procedure Laterality Date  . APPENDECTOMY  2007  . BREAST SURGERY  2009 right  . CATARACT EXTRACTION     x 2  . ESOPHAGOGASTRODUODENOSCOPY (EGD) WITH PROPOFOL N/A 08/14/2016   Procedure: ESOPHAGOGASTRODUODENOSCOPY (EGD) WITH PROPOFOL;  Surgeon: Mauri Pole, MD;  Location: [facility] ENDOSCOPY;  Service: Endoscopy;  Laterality: N/A;  . [place]  . KNEE SURGERY    . MANDIBLE FRACTURE SURGERY    . MASTECTOMY    . PILONIDAL CYST EXCISION    . TONSILLECTOMY      Family History  Problem Relation Age of Onset  . Heart failure Father   . COPD Father   . Arthritis Father 87  . Stroke Mother   . Arthritis Mother 16  . Hyperlipidemia Mother   . Hypertension Mother   . Diabetes Mother   . Diabetes Sister   . Breast cancer Unknown   . Breast cancer Maternal Aunt   . Asthma Maternal Aunt   . Birth defects Maternal Aunt   . Alcohol abuse Maternal Uncle   . Breast cancer Maternal Aunt     Social History   Social History  . Marital status: Single    Spouse name: N/A  . Number of children: 0  . Years of education: N/A   Occupational History  . retired Retired  Social History Main Topics  . Smoking status: Current Some Day Smoker    Packs/day: 0.25    Years: 58.00    Types: Cigarettes    Start date: 07/27/1964  . Smokeless tobacco: Never Used     Comment: 1 pack per week, tobacco infor given 12/30/15  . Alcohol use No  . Drug use: No  . Sexual activity: No   Other Topics Concern  . Not on file   Social History Narrative   Lives alone, continues to smoke, no dietary restrictions    Outpatient Medications Prior to Visit  Medication Sig Dispense Refill  . albuterol (ACCUNEB) 1.25 MG/3ML nebulizer solution Take 3 mLs (1.25 mg total) by nebulization every 6 (six) hours as needed for  wheezing. 75 mL 3  . albuterol (PROVENTIL HFA;VENTOLIN HFA) 108 (90 Base) MCG/ACT inhaler Inhale 2 puffs into the lungs every 6 (six) hours as needed for wheezing or shortness of breath. Only dispense Ventolin 1 Inhaler 3  . BD PEN NEEDLE NANO U/F 32G X 4 MM MISC USE AS DIRECTED WITH LEVEMIR FLEXPEN 100 each 0  . Blood Glucose Monitoring Suppl (ONE TOUCH ULTRA 2) w/Device KIT Use as directed once daily to check blood sugar.  DX E11.9 1 each 0  . cefpodoxime (VANTIN) 200 MG tablet     . Cholecalciferol (VITAMIN D3) 2000 units TABS Take 2,000 Units by mouth every morning.    . fluticasone (FLONASE) 50 MCG/ACT nasal spray Place 2 sprays into both nostrils daily. (Patient taking differently: Place 2 sprays into both nostrils daily as needed for allergies. ) 16 g 6  . furosemide (LASIX) 20 MG tablet TAKE 1 TABLET BY MOUTH TWICE DAILY (Patient taking differently: TAKE 1 TABLET BY MOUTH TWICE DAILY PRN FOR SWELLING) 60 tablet 0  . lactulose (CHRONULAC) 10 GM/15ML solution TAKE 45 MLS BY MOUTH TWICE DAILY AS NEEDED FOR MILD CONSTIPATION. 1892 mL 6  . LEVEMIR FLEXTOUCH 100 UNIT/ML Pen INJECT 14 UNITS UNDER THE SKIN EVERY DAY AT 10PM 15 mL 0  . ONE TOUCH ULTRA TEST test strip USE TWICE DAILY TO CHECK BLOOD SUGAR AS DIRECTED 100 each 4  . Polyvinyl Alcohol (LUBRICANT DROPS OP) Apply 1 drop to eye daily as needed (dry eyes).    . rifaximin (XIFAXAN) 550 MG TABS tablet Take 1 tablet (550 mg total) by mouth 2 (two) times daily. 60 tablet 11  . spironolactone (ALDACTONE) 50 MG tablet TAKE 2 TABLETS(100 MG) BY MOUTH DAILY 60 tablet 0  . Insulin Syringe-Needle U-100 31G X 5/16" 1 ML MISC To use w/ Novolog 100 each 12  . cetirizine (ZYRTEC) 10 MG tablet Take 10 mg by mouth daily as needed for allergies.     . montelukast (SINGULAIR) 10 MG tablet Take 1 tablet (10 mg total) by mouth daily as needed. (Patient taking differently: Take 10 mg by mouth daily as needed (allergies). ) 30 tablet 2  . sodium chloride (OCEAN)  0.65 % SOLN nasal spray Place 1 spray into both nostrils as needed for congestion. (Patient not taking: Reported on 12/24/2016) 30 mL 0  . tiZANidine (ZANAFLEX) 4 MG tablet Take 1 tablet (4 mg total) by mouth every 8 (eight) hours as needed for muscle spasms. (Patient not taking: Reported on 12/24/2016) 30 tablet 1  . traMADol (ULTRAM-ER) 100 MG 24 hr tablet      No facility-administered medications prior to visit.     Allergies  Allergen Reactions  . Citalopram Palpitations    Irregular heart beat  .  Erythromycin Other (See Comments)    Stomach cramps  . Glimepiride     Elevated ammonia levels  . Prednisone     Increased blood sugars too high  . Versed [Midazolam] Other (See Comments)    Patient stayed confusion stayed 4+days     Review of Systems  Constitutional: Positive for malaise/fatigue. Negative for fever.  HENT: Negative for congestion.   Eyes: Negative for blurred vision.  Respiratory: Negative for cough and shortness of breath.   Cardiovascular: Negative for chest pain, palpitations and leg swelling.  Gastrointestinal: Negative for vomiting.  Genitourinary: Positive for frequency and urgency. Negative for dysuria.  Musculoskeletal: Negative for back pain.  Skin: Negative for rash.  Neurological: Negative for loss of consciousness and headaches.  Psychiatric/Behavioral: Negative for depression. The patient is nervous/anxious.        Objective:    Physical Exam  Constitutional: She is oriented to person, place, and time. She appears well-developed and well-nourished. No distress.  HENT:  Head: Normocephalic and atraumatic.  Eyes: Conjunctivae are normal.  Neck: Normal range of motion. No thyromegaly present.  Cardiovascular: Normal rate and regular rhythm.   Pulmonary/Chest: Effort normal and breath sounds normal. She has no wheezes.  Abdominal: Soft. Bowel sounds are normal. There is no tenderness.  Musculoskeletal: Normal range of motion. She exhibits no edema  or deformity.  Neurological: She is alert and oriented to person, place, and time.  Skin: Skin is warm and dry. She is not diaphoretic.  Psychiatric: She has a normal mood and affect.    BP (!) 102/58 (BP Location: Left Arm, Patient Position: Sitting, Cuff Size: Normal)   Pulse 89   Temp 97.7 F (36.5 C) (Oral)   Resp 18   Wt 141 lb 12.8 oz (64.3 kg)   SpO2 93%   BMI 22.21 kg/m  Wt Readings from Last 3 Encounters:  01/07/17 141 lb 12.8 oz (64.3 kg)  01/05/17 140 lb (63.5 kg)  01/03/17 145 lb (65.8 kg)   BP Readings from Last 3 Encounters:  01/07/17 (!) 102/58  01/05/17 (!) 133/52  01/03/17 115/61     Immunization History  Administered Date(s) Administered  . Influenza Split 04/09/2011, 03/27/2012, 03/27/2013  . Influenza,inj,Quad PF,36+ Mos 03/26/2015  . Influenza-Unspecified 04/24/2014, 04/23/2016  . Pneumococcal Conjugate-13 02/01/2014  . Pneumococcal Polysaccharide-23 04/09/2011  . Td 07/27/2010  . Tdap 05/08/2014    Health Maintenance  Topic Date Due  . FOOT EXAM  11/24/2016  . INFLUENZA VACCINE  02/24/2017  . URINE MICROALBUMIN  02/24/2017  . OPHTHALMOLOGY EXAM  05/06/2017  . HEMOGLOBIN A1C  05/12/2017  . MAMMOGRAM  01/24/2018  . COLONOSCOPY  07/27/2020  . TETANUS/TDAP  05/08/2024  . DEXA SCAN  Completed  . PNA vac Low Risk Adult  Completed    Lab Results  Component Value Date   WBC 6.2 01/05/2017   HGB 16.2 (H) 01/05/2017   HCT 46.1 01/05/2017   PLT 115 (L) 01/05/2017   GLUCOSE 159 (H) 01/05/2017   CHOL 164 11/10/2016   TRIG 85.0 11/10/2016   HDL 40.60 11/10/2016   LDLCALC 106 (H) 11/10/2016   ALT 38 01/05/2017   AST 46 (H) 01/05/2017   NA 137 01/05/2017   K 4.3 01/05/2017   CL 100 01/05/2017   CREATININE 1.0 01/05/2017   BUN 16 01/05/2017   CO2 30 01/05/2017   TSH 1.16 11/10/2016   INR 1.2 (H) 12/11/2015   HGBA1C 5.9 11/10/2016   MICROALBUR <0.7 02/25/2016    Lab Results  Component Value Date   TSH 1.16 11/10/2016   Lab Results   Component Value Date   WBC 6.2 01/05/2017   HGB 16.2 (H) 01/05/2017   HCT 46.1 01/05/2017   MCV 95 01/05/2017   PLT 115 (L) 01/05/2017   Lab Results  Component Value Date   NA 137 01/05/2017   K 4.3 01/05/2017   CHLORIDE 104 06/29/2016   CO2 30 01/05/2017   GLUCOSE 159 (H) 01/05/2017   BUN 16 01/05/2017   CREATININE 1.0 01/05/2017   BILITOT 3.10 (H) 01/05/2017   ALKPHOS 163 (H) 01/05/2017   AST 46 (H) 01/05/2017   ALT 38 01/05/2017   PROT 6.1 (L) 01/05/2017   ALBUMIN 3.3 01/05/2017   CALCIUM 9.8 01/05/2017   ANIONGAP 7 11/26/2016   EGFR 50 (L) 06/29/2016   GFR 61.18 12/02/2016   Lab Results  Component Value Date   CHOL 164 11/10/2016   Lab Results  Component Value Date   HDL 40.60 11/10/2016   Lab Results  Component Value Date   LDLCALC 106 (H) 11/10/2016   Lab Results  Component Value Date   TRIG 85.0 11/10/2016   Lab Results  Component Value Date   CHOLHDL 4 11/10/2016   Lab Results  Component Value Date   HGBA1C 5.9 11/10/2016         Assessment & Plan:   Problem List Items Addressed This Visit    Pedal edema    encouraged to minimize sodium, elevate feet above heat and try compression hose. May need to increase diuretics.       Overactive bladder    Urinalysis unremarkable. Discussed medications vs referral for now will limit fluid intake in evenings and report worsening symptoms      Tobacco abuse disorder    Encouraged complete cessation. Discussed need to quit as relates to risk of numerous cancers, cardiac and pulmonary disease as well as neurologic complications. Counseled for greater than 3 minutes      Type 2 diabetes mellitus with hyperglycemia, with long-term current use of insulin (HCC)     minimize simple carbs. Increase exercise as tolerated.      NASH (nonalcoholic steatohepatitis)    She continues to follow with gastroenterology and doing well.       Other Visit Diagnoses    Urinary incontinence, unspecified type    -   Primary   Relevant Orders   Ambulatory referral to Urology   Urine Culture (Completed)   Urinalysis (Completed)   History of frequent urinary tract infections       Relevant Orders   Ambulatory referral to Urology   Urine Culture (Completed)   Urinalysis (Completed)   Abdominal pain, unspecified abdominal location       Relevant Orders   CBC w/Diff   Comp Met (CMET)   Urinalysis (Completed)      I have discontinued Ms. Mongillo sodium chloride, cetirizine, Insulin Syringe-Needle U-100, tiZANidine, montelukast, and traMADol. I am also having her maintain her ONE TOUCH ULTRA TEST, albuterol, lactulose, Vitamin D3, Polyvinyl Alcohol (LUBRICANT DROPS OP), furosemide, BD PEN NEEDLE NANO U/F, rifaximin, ONE TOUCH ULTRA 2, fluticasone, albuterol, spironolactone, LEVEMIR FLEXTOUCH, and cefpodoxime.  Meds ordered this encounter  Medications  . DISCONTD: mupirocin ointment (BACTROBAN) 2 %    Sig: Place 1 application into the nose 2 (two) times daily.    Dispense:  22 g    Refill:  1    CMA served as scribe during this visit. History, Physical and Plan performed  by medical provider. Documentation and orders reviewed and attested to.  Penni Homans, MD

## 2017-01-07 NOTE — Patient Instructions (Signed)
Edema Edema is when you have too much fluid in your body or under your skin. Edema may make your legs, feet, and ankles swell up. Swelling is also common in looser tissues, like around your eyes. This is a common condition. It gets more common as you get older. There are many possible causes of edema. Eating too much salt (sodium) and being on your feet or sitting for a long time can cause edema in your legs, feet, and ankles. Hot weather may make edema worse. Edema is usually painless. Your skin may look swollen or shiny. Follow these instructions at home:  Keep the swollen body part raised (elevated) above the level of your heart when you are sitting or lying down.  Do not sit still or stand for a long time.  Do not wear tight clothes. Do not wear garters on your upper legs.  Exercise your legs. This can help the swelling go down.  Wear elastic bandages or support stockings as told by your doctor.  Eat a low-salt (low-sodium) diet to reduce fluid as told by your doctor.  Depending on the cause of your swelling, you may need to limit how much fluid you drink (fluid restriction).  Take over-the-counter and prescription medicines only as told by your doctor. Contact a doctor if:  Treatment is not working.  You have heart, liver, or kidney disease and have symptoms of edema.  You have sudden and unexplained weight gain. Get help right away if:  You have shortness of breath or chest pain.  You cannot breathe when you lie down.  You have pain, redness, or warmth in the swollen areas.  You have heart, liver, or kidney disease and get edema all of a sudden.  You have a fever and your symptoms get worse all of a sudden. Summary  Edema is when you have too much fluid in your body or under your skin.  Edema may make your legs, feet, and ankles swell up. Swelling is also common in looser tissues, like around your eyes.  Raise (elevate) the swollen body part above the level of your  heart when you are sitting or lying down.  Follow your doctor's instructions about diet and how much fluid you can drink (fluid restriction). This information is not intended to replace advice given to you by your health care provider. Make sure you discuss any questions you have with your health care provider. Document Released: 12/30/2007 Document Revised: 07/31/2016 Document Reviewed: 07/31/2016 Elsevier Interactive Patient Education  2017 [employer]vier Inc[employer]  

## 2017-01-08 ENCOUNTER — Telehealth: Payer: Self-pay | Admitting: Family Medicine

## 2017-01-08 LAB — URINALYSIS
Bilirubin Urine: NEGATIVE
Hgb urine dipstick: NEGATIVE
KETONES UR: NEGATIVE
Leukocytes, UA: NEGATIVE
Nitrite: NEGATIVE
PH: 5.5 (ref 5.0–8.0)
SPECIFIC GRAVITY, URINE: 1.02 (ref 1.000–1.030)
TOTAL PROTEIN, URINE-UPE24: NEGATIVE
URINE GLUCOSE: NEGATIVE
Urobilinogen, UA: 0.2 (ref 0.0–1.0)

## 2017-01-08 MED ORDER — MUPIROCIN 2 % EX OINT: TOPICAL_OINTMENT | CUTANEOUS | 0 refills | Status: DC

## 2017-01-08 NOTE — Telephone Encounter (Signed)
Caller name:Darrell Jewel Relationship to patient: Can be reached: [phone] Pharmacy:[facility]/ [address]  Reason for call:was seen yesterday for leg wound, was prescribed Mupirocin ointment, directions say to place in each nostril. Patient is concerned because the wound is on her leg, not her nose. Also the meds state to not place on open wound, she has a open wound.

## 2017-01-08 NOTE — Telephone Encounter (Signed)
New rx with new directions called in. TY and sorry for any confusion.

## 2017-01-08 NOTE — Telephone Encounter (Signed)
Patient informed. 

## 2017-01-09 LAB — URINE CULTURE: ORGANISM ID, BACTERIA: NO GROWTH

## 2017-01-10 NOTE — Assessment & Plan Note (Signed)
minimize simple carbs. Increase exercise as tolerated.  

## 2017-01-10 NOTE — Assessment & Plan Note (Signed)
Urinalysis unremarkable. Discussed medications vs referral for now will limit fluid intake in evenings and report worsening symptoms

## 2017-01-10 NOTE — Assessment & Plan Note (Signed)
She continues to follow with gastroenterology and doing well.

## 2017-01-10 NOTE — Assessment & Plan Note (Signed)
Encouraged complete cessation. Discussed need to quit as relates to risk of numerous cancers, cardiac and pulmonary disease as well as neurologic complications. Counseled for greater than 3 minutes 

## 2017-01-10 NOTE — Assessment & Plan Note (Signed)
encouraged to minimize sodium, elevate feet above heat and try compression hose. May need to increase diuretics.

## 2017-01-13 ENCOUNTER — Other Ambulatory Visit: Payer: Self-pay

## 2017-01-13 NOTE — Patient Outreach (Signed)
[facility]) Care Management  01/13/2017  Ashley Savage 10-21-1940 [id number]  Nurse call line Nurse call line date; 01/11/17 REFERRAL REASON;  Heat exposure Nurse call line recommendation: Reassurance: move to cool shady area, drink water and or sports drink, call back if needed. [facility] care management referral date: 01/12/17  SUBJECTIVE: Telephone call to patient regarding nurse call line follow up. HIPAA verified with patient. Patient states she is doing fine now. Patient states she was having chills and wasn't sure why. Patient states she had been out most of the day in and out of the heat and her car.  Patient thinks she had to much heat exposure. Patient states she drank a propel sports drink and felt much better after that. Denies any further needs at this time.  Patient verbally agreed to next follow up with RNCM.  Patient given 24 hour nurse advise line number. Patient instructed to call 911 for severe/ emergent symptoms.  Patient advised to follow up with doctor for other symptoms/ concerns.  PLAN:  RNCM will follow up with patient at next scheduled outreach.  [name] RN,BSN,CCM [facility] Telephonic  [phone]

## 2017-01-14 ENCOUNTER — Ambulatory Visit: Payer: Self-pay

## 2017-01-17 ENCOUNTER — Encounter (HOSPITAL_BASED_OUTPATIENT_CLINIC_OR_DEPARTMENT_OTHER): Payer: Self-pay | Admitting: Emergency Medicine

## 2017-01-17 ENCOUNTER — Emergency Department (HOSPITAL_BASED_OUTPATIENT_CLINIC_OR_DEPARTMENT_OTHER)
Admission: EM | Admit: 2017-01-17 | Discharge: 2017-01-17 | Disposition: A | Discharge status: Home / Self Care | Payer: PPO | Attending: Emergency Medicine | Admitting: Emergency Medicine

## 2017-01-17 ENCOUNTER — Emergency Department (HOSPITAL_BASED_OUTPATIENT_CLINIC_OR_DEPARTMENT_OTHER): Payer: PPO

## 2017-01-17 DIAGNOSIS — J439 Emphysema, unspecified: Secondary | ICD-10-CM | POA: Diagnosis not present

## 2017-01-17 DIAGNOSIS — E119 Type 2 diabetes mellitus without complications: Secondary | ICD-10-CM | POA: Diagnosis not present

## 2017-01-17 DIAGNOSIS — F17210 Nicotine dependence, cigarettes, uncomplicated: Secondary | ICD-10-CM | POA: Insufficient documentation

## 2017-01-17 DIAGNOSIS — Z794 Long term (current) use of insulin: Secondary | ICD-10-CM | POA: Insufficient documentation

## 2017-01-17 DIAGNOSIS — R7889 Finding of other specified substances, not normally found in blood: Secondary | ICD-10-CM | POA: Diagnosis not present

## 2017-01-17 DIAGNOSIS — J449 Chronic obstructive pulmonary disease, unspecified: Secondary | ICD-10-CM | POA: Insufficient documentation

## 2017-01-17 DIAGNOSIS — I700 Atherosclerosis of aorta: Secondary | ICD-10-CM | POA: Diagnosis not present

## 2017-01-17 DIAGNOSIS — Z79899 Other long term (current) drug therapy: Secondary | ICD-10-CM | POA: Insufficient documentation

## 2017-01-17 DIAGNOSIS — R4182 Altered mental status, unspecified: Secondary | ICD-10-CM | POA: Diagnosis not present

## 2017-01-17 LAB — URINALYSIS, ROUTINE W REFLEX MICROSCOPIC
Bilirubin Urine: NEGATIVE
Glucose, UA: NEGATIVE mg/dL
Hgb urine dipstick: NEGATIVE
KETONES UR: NEGATIVE mg/dL
Leukocytes, UA: NEGATIVE
NITRITE: NEGATIVE
PH: 7.5 (ref 5.0–8.0)
Protein, ur: NEGATIVE mg/dL
SPECIFIC GRAVITY, URINE: 1.019 (ref 1.005–1.030)

## 2017-01-17 LAB — COMPREHENSIVE METABOLIC PANEL
ALT: 31 U/L (ref 14–54)
ANION GAP: 9 (ref 5–15)
AST: 44 U/L — ABNORMAL HIGH (ref 15–41)
Albumin: 2.7 g/dL — ABNORMAL LOW (ref 3.5–5.0)
Alkaline Phosphatase: 131 U/L — ABNORMAL HIGH (ref 38–126)
BUN: 16 mg/dL (ref 6–20)
CHLORIDE: 103 mmol/L (ref 101–111)
CO2: 25 mmol/L (ref 22–32)
Calcium: 9.1 mg/dL (ref 8.9–10.3)
Creatinine, Ser: 0.82 mg/dL (ref 0.44–1.00)
GFR calc Af Amer: >60 mL/min (ref >60)
GFR calc non Af Amer: >60 mL/min (ref >60)
Glucose, Bld: 131 mg/dL — ABNORMAL HIGH (ref 65–99)
POTASSIUM: 4.5 mmol/L (ref 3.5–5.1)
SODIUM: 137 mmol/L (ref 135–145)
Total Bilirubin: 2.9 mg/dL — ABNORMAL HIGH (ref 0.3–1.2)
Total Protein: 5.5 g/dL — ABNORMAL LOW (ref 6.5–8.1)

## 2017-01-17 LAB — I-STAT VENOUS BLOOD GAS, ED
Acid-Base Excess: 3 mmol/L — ABNORMAL HIGH (ref 0.0–2.0)
Bicarbonate: 27.4 mmol/L (ref 20.0–28.0)
O2 SAT: 71 %
PCO2 VEN: 38.8 mmHg — AB (ref 44.0–60.0)
PH VEN: 7.455 — AB (ref 7.250–7.430)
Patient temperature: 97.7
TCO2: 29 mmol/L (ref 0–100)
pO2, Ven: 34 mmHg (ref 32.0–45.0)

## 2017-01-17 LAB — CBC WITH DIFFERENTIAL/PLATELET
BASOS PCT: 1 %
Basophils Absolute: 0 10*3/uL (ref 0.0–0.1)
EOS ABS: 0.2 10*3/uL (ref 0.0–0.7)
Eosinophils Relative: 3 %
HCT: 41.9 % (ref 36.0–46.0)
Hemoglobin: 15 g/dL (ref 12.0–15.0)
LYMPHS ABS: 1.8 10*3/uL (ref 0.7–4.0)
Lymphocytes Relative: 27 %
MCH: 33.6 pg (ref 26.0–34.0)
MCHC: 35.8 g/dL (ref 30.0–36.0)
MCV: 93.9 fL (ref 78.0–100.0)
MONOS PCT: 14 %
Monocytes Absolute: 0.9 10*3/uL (ref 0.1–1.0)
NEUTROS PCT: 55 %
Neutro Abs: 3.7 10*3/uL (ref 1.7–7.7)
PLATELETS: 114 10*3/uL — AB (ref 150–400)
RBC: 4.46 MIL/uL (ref 3.87–5.11)
RDW: 14.2 % (ref 11.5–15.5)
WBC: 6.7 10*3/uL (ref 4.0–10.5)

## 2017-01-17 LAB — AMMONIA: Ammonia: 92 umol/L — ABNORMAL HIGH (ref 9–35)

## 2017-01-17 MED ORDER — ONDANSETRON HCL 4 MG/2ML IJ SOLN: 4.0000 mg | Freq: Once | INTRAMUSCULAR | Status: DC

## 2017-01-17 MED ORDER — IPRATROPIUM-ALBUTEROL 0.5-2.5 (3) MG/3ML IN SOLN
RESPIRATORY_TRACT | Status: AC
  Administered 2017-01-17: 3 mL
  Filled 2017-01-17: qty 3

## 2017-01-17 MED ORDER — IPRATROPIUM-ALBUTEROL 0.5-2.5 (3) MG/3ML IN SOLN: 3.0000 mL | RESPIRATORY_TRACT | Status: DC | PRN

## 2017-01-17 NOTE — ED Triage Notes (Signed)
Patient states that she woke up this am "confused". She states that she urinated on herself by accident. The patient reports that when he liver enzymes are elevated she usually gets "like this"The patient  Is A/O x 3

## 2017-01-17 NOTE — ED Notes (Signed)
Back from xray

## 2017-01-17 NOTE — ED Notes (Signed)
Patient ambulatory to the restroom. The patient with this RN at side while walking but no assistance needs. Stedy gait

## 2017-01-17 NOTE — ED Notes (Signed)
Patient transported to X-ray 

## 2017-01-17 NOTE — Discharge Instructions (Signed)
Please take an extra dose of your lactulose today and follow up with your PCP for recheck.

## 2017-01-17 NOTE — ED Notes (Signed)
Meal given

## 2017-01-17 NOTE — ED Notes (Signed)
Pt able to ambulate x1 min assist. MD aware

## 2017-01-17 NOTE — ED Provider Notes (Signed)
[facility] DEPT [facility] Provider Note   CSN: [id number] Arrival date & time: 01/17/17  0902     History   Chief Complaint Chief Complaint  Patient presents with  . Altered Mental Status    HPI Ashley Savage is a 76 y.o. female.   Altered Mental Status   This is a recurrent problem. The current episode started 3 to 5 hours ago. The problem has been resolved. Associated symptoms include confusion. Risk factors include a recent illness. Her past medical history is significant for liver disease and COPD.      Past Medical History:  Diagnosis Date  . Allergic state 11/10/2016  . Anxiety   . Arthritis of both knees 10/01/2013  . Benign paroxysmal positional vertigo 10/01/2013  . Cancer [facility]) breast ca  right  . COPD (chronic obstructive pulmonary disease) ([place]) 10/01/2013  . Depression   . Diabetes mellitus type 2  . Emphysema   . Encephalopathy, hepatic ([place]) 06/07/2014  . Esophageal reflux 10/01/2013  . Fall 07/30/2016  . Hyperlipidemia   . Hyperlipidemia, mixed   . Increased ammonia level 11/25/2014  . NASH (nonalcoholic steatohepatitis) 08/26/2015  . Neck pain 10/01/2013  . Neuropathy    feet   . Overactive bladder 12/10/2013  . Panic attacks   . Pedal edema 12/10/2013  . Personal history of radiation therapy   . Preventative health care 03/08/2016  . Tobacco abuse disorder 02/01/2014    Patient Active Problem List   Diagnosis Date Noted  . Allergic state 11/10/2016  . 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 Portal hypertensive gastropathy ([place])   . Lower back injury, initial encounter 08/05/2016  . Fall 07/30/2016  . Preventative health care 03/08/2016  . Muscle spasm 02/25/2016  . Chronic respiratory failure ([place]) 08/29/2015  . NASH (nonalcoholic steatohepatitis) 08/26/2015  . Type 2 diabetes mellitus with hyperglycemia, with long-term current use of insulin ([place])   . 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 Diarrhea 12/30/2014  . Increased ammonia level  11/25/2014  . Diabetes mellitus type 2, controlled ([place]) 10/16/2014  . Superficial bruising 10/04/2014  . Encephalopathy, hepatic ([place]) 06/07/2014  . Right knee pain 06/07/2014  . Peripheral edema 03/30/2014  . Sun-damaged skin 02/01/2014  . Anxiety and depression 02/01/2014  . Tobacco abuse disorder 02/01/2014  . Medicare annual wellness visit, subsequent 02/01/2014  . Pedal edema 12/10/2013  . Overactive bladder 12/10/2013  . Abdominal aortic aneurysm ([place]) 10/01/2013  . Arthritis of right knee 10/01/2013  . Benign paroxysmal positional vertigo 10/01/2013  . Neck pain 10/01/2013  . Esophageal reflux 10/01/2013  . Thrombocytopenia ([place]) 03/17/2012  . 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   . Hyperlipidemia, mixed   . Breast cancer [facility])     Past Surgical History:  Procedure Laterality Date  . APPENDECTOMY  2007  . BREAST SURGERY  2009 right  . CATARACT EXTRACTION     x 2  . ESOPHAGOGASTRODUODENOSCOPY (EGD) WITH PROPOFOL N/A 08/14/2016   Procedure: ESOPHAGOGASTRODUODENOSCOPY (EGD) WITH PROPOFOL;  Surgeon: Mauri Pole, MD;  Location: [facility];  Service: Endoscopy;  Laterality: N/A;  . [place]  . KNEE SURGERY    . MANDIBLE FRACTURE SURGERY    . MASTECTOMY    . PILONIDAL CYST EXCISION    . TONSILLECTOMY      OB History    No data available       Home Medications    Prior to Admission medications  Medication Sig Start Date End Date Taking? Authorizing Provider  albuterol (ACCUNEB) 1.25 MG/3ML nebulizer solution Take 3 mLs (1.25 mg total) by nebulization every 6 (six) hours as needed for wheezing. 12/10/16   Rigoberto Noel, MD  albuterol (PROVENTIL HFA;VENTOLIN HFA) 108 (90 Base) MCG/ACT inhaler Inhale 2 puffs into the lungs every 6 (six) hours as needed for wheezing or shortness of breath. Only dispense Ventolin 02/25/16   Mosie Lukes, MD  BD PEN NEEDLE NANO U/F 32G X 4 MM MISC USE AS DIRECTED WITH LEVEMIR  FLEXPEN 08/17/16   Mosie Lukes, MD  Blood Glucose Monitoring Suppl (ONE TOUCH ULTRA 2) w/Device KIT Use as directed once daily to check blood sugar.  DX E11.9 09/14/16   Mosie Lukes, MD  cefpodoxime Bennie Pierini) 200 MG tablet  11/27/16   [provider]  Cholecalciferol (VITAMIN D3) 2000 units TABS Take 2,000 Units by mouth every morning.    [provider]  fluticasone (FLONASE) 50 MCG/ACT nasal spray Place 2 sprays into both nostrils daily. Patient taking differently: Place 2 sprays into both nostrils daily as needed for allergies.  11/10/16   Mosie Lukes, MD  furosemide (LASIX) 20 MG tablet TAKE 1 TABLET BY MOUTH TWICE DAILY Patient taking differently: TAKE 1 TABLET BY MOUTH TWICE DAILY PRN FOR SWELLING 08/14/16   Nandigam, Venia Minks, MD  lactulose (CHRONULAC) 10 GM/15ML solution TAKE 45 MLS BY MOUTH TWICE DAILY AS NEEDED FOR MILD CONSTIPATION. 03/02/16   Mosie Lukes, MD  LEVEMIR FLEXTOUCH 100 UNIT/ML Pen INJECT 14 UNITS UNDER THE SKIN EVERY DAY AT 10PM 12/25/16   Mosie Lukes, MD  mupirocin ointment (BACTROBAN) 2 % Place on application over lesion on leg twice daily. 01/08/17   Shelda Pal, DO  ONE TOUCH ULTRA TEST test strip USE TWICE DAILY TO CHECK BLOOD SUGAR AS DIRECTED 12/03/15   Mosie Lukes, MD  Polyvinyl Alcohol (LUBRICANT DROPS OP) Apply 1 drop to eye daily as needed (dry eyes).    [provider]  rifaximin (XIFAXAN) 550 MG TABS tablet Take 1 tablet (550 mg total) by mouth 2 (two) times daily. 09/04/16   Esterwood, Amy S, PA-C  spironolactone (ALDACTONE) 50 MG tablet TAKE 2 TABLETS(100 MG) BY MOUTH DAILY 12/25/16   Mauri Pole, MD    Family History Family History  Problem Relation Age of Onset  . Heart failure Father   . COPD Father   . Arthritis Father 35  . Stroke Mother   . Arthritis Mother 66  . Hyperlipidemia Mother   . Hypertension Mother   . Diabetes Mother   . Diabetes Sister   . Breast cancer Unknown   . Breast  cancer Maternal Aunt   . Asthma Maternal Aunt   . Birth defects Maternal Aunt   . Alcohol abuse Maternal Uncle   . Breast cancer Maternal Aunt     Social History Social History  Substance Use Topics  . Smoking status: Current Some Day Smoker    Packs/day: 0.25    Years: 58.00    Types: Cigarettes    Start date: 07/27/1964  . Smokeless tobacco: Never Used     Comment: 1 pack per week, tobacco infor given 12/30/15  . Alcohol use No     Allergies   Citalopram; Erythromycin; Glimepiride; Prednisone; and Versed [midazolam]   Review of Systems Review of Systems  Psychiatric/Behavioral: Positive for confusion.  All other systems reviewed and are negative.    Physical Exam Updated  Vital Signs BP (!) 122/53 (BP Location: Right Arm)   Pulse 83   Temp 97.7 F (36.5 C) (Oral)   Resp 20   Ht _0  (1.676 m)   Wt 63.5 kg (140 lb)   SpO2 93%   BMI 22.60 kg/m   Physical Exam  Constitutional: She is oriented to person, place, and time. She appears well-developed and well-nourished.  HENT:  Head: Normocephalic and atraumatic.  Eyes: Conjunctivae and EOM are normal.  Neck: Normal range of motion.  Cardiovascular: Normal rate and regular rhythm.   Pulmonary/Chest: No stridor. No respiratory distress. She has wheezes.  Abdominal: Soft. She exhibits no distension.  Neurological: She is alert and oriented to person, place, and time. She displays normal reflexes. No cranial nerve deficit. Coordination normal.  No altered mental status, able to give full seemingly accurate history.  Face is symmetric, EOM's intact, pupils equal and reactive, vision intact, tongue and uvula midline without deviation Upper and Lower extremity motor 5/5, intact pain perception in distal extremities, 2+ reflexes in biceps, patella and achilles tendons. Finger to nose normal, heel to shin normal. After treatment, walks without assistance or evident ataxia at baseline (with walker).    Skin: Skin is warm and  dry.  Nursing note and vitals reviewed.    ED Treatments / Results  Labs (all labs ordered are listed, but only abnormal results are displayed) Labs Reviewed  CBC WITH DIFFERENTIAL/PLATELET - Abnormal; Notable for the following:       Result Value   Platelets 114 (*)    All other components within normal limits  COMPREHENSIVE METABOLIC PANEL - Abnormal; Notable for the following:    Glucose, Bld 131 (*)    Total Protein 5.5 (*)    Albumin 2.7 (*)    AST 44 (*)    Alkaline Phosphatase 131 (*)    Total Bilirubin 2.9 (*)    All other components within normal limits  URINALYSIS, ROUTINE W REFLEX MICROSCOPIC - Abnormal; Notable for the following:    Color, Urine AMBER (*)    All other components within normal limits  AMMONIA - Abnormal; Notable for the following:    Ammonia 92 (*)    All other components within normal limits  I-STAT VENOUS BLOOD GAS, ED - Abnormal; Notable for the following:    pH, Ven 7.455 (*)    pCO2, Ven 38.8 (*)    Acid-Base Excess 3.0 (*)    All other components within normal limits  BLOOD GAS, VENOUS    EKG  EKG Interpretation None       Radiology Dg Chest 2 View  Result Date: 01/17/2017 CLINICAL DATA:  Evaluate for pneumonia.  Emphysema. EXAM: CHEST  2 VIEW COMPARISON:  11/25/2016 FINDINGS: Lungs are slightly hyperexpanded with flattening of the hemidiaphragms. There is no focal airspace consolidation or effusion. Mild emphysematous disease. Cardiomediastinal silhouette is within normal. Calcified plaque over the thoracic aorta. Minimal degenerate change of the spine. IMPRESSION: No acute cardiopulmonary disease. COPD. Aortic atherosclerosis. Electronically Signed   By: Marin Olp M.D.   On: 01/17/2017 09:57    Procedures Procedures (including critical care time)  Medications Ordered in ED Medications  ipratropium-albuterol (DUONEB) 0.5-2.5 (3) MG/3ML nebulizer solution 3 mL (not administered)  ipratropium-albuterol (DUONEB) 0.5-2.5 (3)  MG/3ML nebulizer solution (3 mLs  Given 01/17/17 0914)     Initial Impression / Assessment and Plan / ED Course  I have reviewed the triage vital signs and the nursing notes.  Pertinent labs &  imaging results that were available during my care of the patient were reviewed by me and considered in my medical decision making (see chart for details).     One brief episode of confusion this morning where she could not rememer someone's name. Resolved quickly and at base baseline after that. She is observed in the emergency department for multiple hours without recurrence of symptoms. She is at her baseline vital sign wise and ambulatory otherwise. She has no pneumonia, urinary tract infection or other metabolic causes for her symptoms. Her ammonia was 92 however once again she is alert and oriented without any evidence of hepatic encephalopathy. I think it is to be unlikely to have been the cause of her confusion earlier as it resolved quickly without any intervention. The patient is stable for discharge home at this time and can take 1 extra dose of lactulose and follow up with her primary doctor for further treatment and management.  Final Clinical Impressions(s) / ED Diagnoses   Final diagnoses:  Altered mental status, unspecified altered mental status type    New Prescriptions Discharge Medication List as of 01/17/2017  3:04 PM       Melchizedek Espinola, Corene Cornea, MD 01/17/17 [phone]

## 2017-01-18 ENCOUNTER — Other Ambulatory Visit: Payer: Self-pay

## 2017-01-18 NOTE — Patient Outreach (Signed)
[facility]) Care Management  01/18/2017  Ashley Savage October 21, 1940 [id number]  Emergency room follow up outreach. Altered mental status Emergency room visit date: 01/17/17  Telephone call to patient regarding emergency department follow up. Unable to reach patient. HIPAA compliant voice message left with call back phone number.   PLAN; RNCM will attempt 2nd telephone call to patient within 1 week.   [name] RN,BSN,CCM [facility] Telephonic  [phone]

## 2017-01-20 ENCOUNTER — Other Ambulatory Visit: Payer: Self-pay

## 2017-01-20 NOTE — Patient Outreach (Signed)
[facility]) Care Management  01/20/2017  Ashley Savage October 27, 1940 [id number]  Emergency Room follow up call Referral date:11/13/16 Insurance: [employer] Ad[employer]age  Providers:  Dr. Willette Alma: primary MD Dr. Karlton Lemon: sports medicine Dr. Kara Mead:  Pulmonary  Dr. Marin Olp: Hematology/ oncology Dr. Nicoletta Ba: Gastroenterology  SOCIAL: Patient lives alone. Patient is able to drive  SUBJECTIVE: Telephone call to patient regarding emergency room follow up.  HIPAA verified with patient. Patient denies having any symptoms of confusion at this time. Patient states she has taken her lactulose as prescribed. Patient states she is taking the lactulose 1 x per day as prescribed by her doctor.   Patient states she has not had a bowel movement since Monday 01/18/17.  Patient states she did take her lactulose today.  Patient reports today's weight is 140 lbs. Patient states she would like to gain a little more weight. Patient reports she drinks nutritional drinks with her meals. Advised to increase her caloric intake. Patient in agreement to monitor weight and report to [facility] at next outreach.  Patient states she has some swelling in her ankles/ legs. Patient states her doctor is aware. States she had an episode of weeping from her legs but this has since resolved. RNCM advised patient to wear TED hose and elevate legs as much as possible. Patient states she will purchase new hose shortly.  Patient states she has urinary incontinence often due to the fluid pills the doctor has her taking. Patient states she has had a history of urinary incontinence but taking the fluid pills makes it worse. Patient states she her primary MD is going to refer her to a urologist.  Patient states she has not seen her gastroenterologist since January 2018 when she had endoscopy done. Patient states unsure when she is suppose to see this doctor again. RNCM advised patient to call gastroenterologist  office and find out when the doctor would like to see her again. Advised patient to schedule appointment.  Patient verbally agreed to next telephone outreach  with [facility] RNCM advised patient to notify MD of any changes in condition prior to scheduled appointment. RNCM verified patient aware of 911 services for urgent/ emergent needs.  PLAN; RNCM will follow up with patient within the month of July 2018 Patient will contact her gastroenterologist office and set up follow up appointment. Patient will report her weight at next outreach.   [name] RN,BSN,CCM [facility] Telephonic  [phone]

## 2017-01-21 ENCOUNTER — Other Ambulatory Visit (INDEPENDENT_AMBULATORY_CARE_PROVIDER_SITE_OTHER): Payer: PPO

## 2017-01-21 DIAGNOSIS — R109 Unspecified abdominal pain: Secondary | ICD-10-CM | POA: Diagnosis not present

## 2017-01-21 LAB — CBC WITH DIFFERENTIAL/PLATELET
BASOS ABS: 0.1 10*3/uL (ref 0.0–0.1)
Basophils Relative: 1.2 % (ref 0.0–3.0)
Eosinophils Absolute: 0.2 10*3/uL (ref 0.0–0.7)
Eosinophils Relative: 3.2 % (ref 0.0–5.0)
HEMATOCRIT: 42.3 % (ref 36.0–46.0)
Hemoglobin: 14.3 g/dL (ref 12.0–15.0)
LYMPHS ABS: 1.5 10*3/uL (ref 0.7–4.0)
Lymphocytes Relative: 24.8 % (ref 12.0–46.0)
MCHC: 33.7 g/dL (ref 30.0–36.0)
MCV: 98 fl (ref 78.0–100.0)
MONOS PCT: 15.6 % — AB (ref 3.0–12.0)
Monocytes Absolute: 1 10*3/uL (ref 0.1–1.0)
Neutro Abs: 3.4 10*3/uL (ref 1.4–7.7)
Neutrophils Relative %: 55.2 % (ref 43.0–77.0)
PLATELETS: 126 10*3/uL — AB (ref 150.0–400.0)
RBC: 4.32 Mil/uL (ref 3.87–5.11)
RDW: 14.2 % (ref 11.5–15.5)
WBC: 6.1 10*3/uL (ref 4.0–10.5)

## 2017-01-21 LAB — COMPREHENSIVE METABOLIC PANEL
ALBUMIN: 2.6 g/dL — AB (ref 3.5–5.2)
ALT: 26 U/L (ref 0–35)
AST: 35 U/L (ref 0–37)
Alkaline Phosphatase: 121 U/L — ABNORMAL HIGH (ref 39–117)
BUN: 16 mg/dL (ref 6–23)
CALCIUM: 8.9 mg/dL (ref 8.4–10.5)
CHLORIDE: 104 meq/L (ref 96–112)
CO2: 28 mEq/L (ref 19–32)
CREATININE: 0.88 mg/dL (ref 0.40–1.20)
GFR: 66.8 mL/min (ref >60.00)
Glucose, Bld: 165 mg/dL — ABNORMAL HIGH (ref 70–99)
POTASSIUM: 4.2 meq/L (ref 3.5–5.1)
Sodium: 138 mEq/L (ref 135–145)
Total Bilirubin: 2.2 mg/dL — ABNORMAL HIGH (ref 0.2–1.2)
Total Protein: 5.1 g/dL — ABNORMAL LOW (ref 6.0–8.3)

## 2017-01-26 ENCOUNTER — Other Ambulatory Visit: Payer: Self-pay | Admitting: Gastroenterology

## 2017-01-26 ENCOUNTER — Other Ambulatory Visit: Payer: Self-pay | Admitting: Family Medicine

## 2017-01-26 ENCOUNTER — Encounter: Payer: Self-pay | Admitting: Physician Assistant

## 2017-01-29 ENCOUNTER — Other Ambulatory Visit: Payer: Self-pay

## 2017-01-29 NOTE — Patient Outreach (Signed)
[facility] [facility]) Care Management  01/29/2017  Ashley Savage 1941/02/26 [id number]   TELEPHONE SCREENING Referral date: 01/28/17 Referral source:  Nurse call center Referral reason: Frequent urination  Insurance: Health team advantage Attempt #1  Telephone call to patient regarding nurse call center referral. Unable to reach patient. HIPAA compliant voice message left with call back phone number.   PLAN: RNCM will attempt 2nd telephone outreach to patient within 1 week.   [name] RN,BSN,CCM [facility] Telephonic  [phone]

## 2017-02-02 ENCOUNTER — Ambulatory Visit: Payer: Self-pay

## 2017-02-03 ENCOUNTER — Other Ambulatory Visit: Payer: Self-pay

## 2017-02-03 NOTE — Patient Outreach (Signed)
[facility] [facility]) Care Management  02/03/2017  Ashley Savage Sep 07, 1940 [id number]   TELEPHONE SCREENING Referral date: 01/28/17 Referral source:  Nurse call center Referral reason: Frequent urination  Insurance: Health team advantage Attempt #2  Second telephone call to patient regarding nurse call center referral. Unable to reach patient. HIPAA compliant voice message left with call back phone number.   PLAN: RNCM will attempt 3rd telephone outreach to patient within 1 week.   [name] RN,BSN,CCM [facility] Telephonic  [phone]

## 2017-02-05 ENCOUNTER — Ambulatory Visit: Payer: Self-pay

## 2017-02-06 ENCOUNTER — Other Ambulatory Visit: Payer: Self-pay | Admitting: Family Medicine

## 2017-02-08 ENCOUNTER — Encounter: Payer: Self-pay | Admitting: Pulmonary Disease

## 2017-02-08 NOTE — Telephone Encounter (Signed)
RA - please advise. Thanks! 

## 2017-02-09 ENCOUNTER — Encounter: Payer: Self-pay | Admitting: Family Medicine

## 2017-02-09 ENCOUNTER — Ambulatory Visit (INDEPENDENT_AMBULATORY_CARE_PROVIDER_SITE_OTHER): Payer: PPO | Admitting: Family Medicine

## 2017-02-09 ENCOUNTER — Other Ambulatory Visit: Payer: Self-pay

## 2017-02-09 DIAGNOSIS — K7581 Nonalcoholic steatohepatitis (NASH): Secondary | ICD-10-CM

## 2017-02-09 DIAGNOSIS — K7460 Unspecified cirrhosis of liver: Secondary | ICD-10-CM

## 2017-02-09 DIAGNOSIS — E1142 Type 2 diabetes mellitus with diabetic polyneuropathy: Secondary | ICD-10-CM | POA: Diagnosis not present

## 2017-02-09 DIAGNOSIS — Z794 Long term (current) use of insulin: Secondary | ICD-10-CM

## 2017-02-09 DIAGNOSIS — R600 Localized edema: Secondary | ICD-10-CM | POA: Diagnosis not present

## 2017-02-09 DIAGNOSIS — J449 Chronic obstructive pulmonary disease, unspecified: Secondary | ICD-10-CM

## 2017-02-09 DIAGNOSIS — E782 Mixed hyperlipidemia: Secondary | ICD-10-CM | POA: Diagnosis not present

## 2017-02-09 DIAGNOSIS — Z720 Tobacco use: Secondary | ICD-10-CM

## 2017-02-09 LAB — CBC
HCT: 43.4 % (ref 36.0–46.0)
HEMOGLOBIN: 14.6 g/dL (ref 12.0–15.0)
MCHC: 33.6 g/dL (ref 30.0–36.0)
MCV: 97.9 fl (ref 78.0–100.0)
Platelets: 113 10*3/uL — ABNORMAL LOW (ref 150.0–400.0)
RBC: 4.44 Mil/uL (ref 3.87–5.11)
RDW: 14.6 % (ref 11.5–15.5)
WBC: 5.8 10*3/uL (ref 4.0–10.5)

## 2017-02-09 LAB — LIPID PANEL
CHOLESTEROL: 160 mg/dL (ref 0–200)
HDL: 48.4 mg/dL (ref >39.00)
LDL Cholesterol: 96 mg/dL (ref 0–99)
NONHDL: 111.37
TRIGLYCERIDES: 78 mg/dL (ref 0.0–149.0)
Total CHOL/HDL Ratio: 3
VLDL: 15.6 mg/dL (ref 0.0–40.0)

## 2017-02-09 LAB — COMPREHENSIVE METABOLIC PANEL
ALBUMIN: 2.7 g/dL — AB (ref 3.5–5.2)
ALK PHOS: 122 U/L — AB (ref 39–117)
ALT: 26 U/L (ref 0–35)
AST: 35 U/L (ref 0–37)
BILIRUBIN TOTAL: 2.6 mg/dL — AB (ref 0.2–1.2)
BUN: 14 mg/dL (ref 6–23)
CALCIUM: 8.8 mg/dL (ref 8.4–10.5)
CO2: 31 mEq/L (ref 19–32)
Chloride: 101 mEq/L (ref 96–112)
Creatinine, Ser: 0.91 mg/dL (ref 0.40–1.20)
GFR: 64.26 mL/min (ref >60.00)
Glucose, Bld: 149 mg/dL — ABNORMAL HIGH (ref 70–99)
Potassium: 4.2 mEq/L (ref 3.5–5.1)
Sodium: 137 mEq/L (ref 135–145)
TOTAL PROTEIN: 5.3 g/dL — AB (ref 6.0–8.3)

## 2017-02-09 LAB — HEMOGLOBIN A1C: HEMOGLOBIN A1C: 5.8 % (ref 4.6–6.5)

## 2017-02-09 MED ORDER — GLUCOSE BLOOD VI STRP: ORAL_STRIP | 11 refills | Status: DC

## 2017-02-09 NOTE — Telephone Encounter (Signed)
Trial of STIOLTO or ANORO , depending on which one is covered by insurance Ok to give sample x 1 before Rx

## 2017-02-09 NOTE — Patient Instructions (Addendum)
Call appointment with her Gastroenterologist Dr Silverio Decamp in next 3 months for follow up. Call and make appointment  Add yoigurt to smoothies and/or yellow split pea powder Cirrhosis Cirrhosis is long-term (chronic) liver injury. The liver is your largest internal organ, and it performs many functions. The liver converts food into energy, removes toxic material from your blood, makes important proteins, and absorbs necessary vitamins from your diet. If you have cirrhosis, it means many of your healthy liver cells have been replaced by scar tissue. This prevents blood from flowing through your liver, which makes it difficult for your liver to function. This scarring is not reversible, but treatment can prevent it from getting worse. What are the causes? Hepatitis C and long-term alcohol abuse are the most common causes of cirrhosis. Other causes include:  Nonalcoholic fatty liver disease.  Hepatitis B infection.  Autoimmune hepatitis.  Diseases that cause blockage of ducts inside the liver.  Inherited liver diseases.  Reactions to certain long-term medicines.  Parasitic infections.  Long-term exposure to certain toxins.  What increases the risk? You may have a higher risk of cirrhosis if you:  Have certain hepatitis viruses.  Abuse alcohol, especially if you are female.  Are overweight.  Share needles.  Have unprotected sex with someone who has hepatitis.  What are the signs or symptoms? You may not have any signs and symptoms at first. Symptoms may not develop until the damage to your liver starts to get worse. Signs and symptoms of cirrhosis may include:  Tenderness in the right-upper part of your abdomen.  Weakness and tiredness (fatigue).  Loss of appetite.  Nausea.  Weight loss and muscle loss.  Itchiness.  Yellow skin and eyes (jaundice).  Buildup of fluid in the abdomen (ascites).  Swelling of the feet and ankles (edema).  Appearance of tiny blood  vessels under the skin.  Mental confusion.  Easy bruising and bleeding.  How is this diagnosed? Your health care provider may suspect cirrhosis based on your symptoms and medical history, especially if you have other medical conditions or a history of alcohol abuse. Your health care provider will do a physical exam to feel your liver and check for signs of cirrhosis. Your health care provider may perform other tests, including:  Blood tests to check: ? Whether you have hepatitis B or C. ? Kidney function. ? Liver function.  Imaging tests such as: ? MRI or CT scan to look for changes seen in advanced cirrhosis. ? Ultrasound to see if normal liver tissue is being replaced by scar tissue.  A procedure using a long needle to take a sample of liver tissue (biopsy) for examination under a microscope. Liver biopsy can confirm the diagnosis of cirrhosis.  How is this treated? Treatment depends on how damaged your liver is and what caused the damage. Treatment may include treating cirrhosis symptoms or treating the underlying causes of the condition to try to slow the progression of the damage. Treatment may include:  Making lifestyle changes, such as: ? Eating a healthy diet. ? Restricting salt intake. ? Maintaining a healthy weight. ? Not abusing drugs or alcohol.  Taking medicines to: ? Treat liver infections or other infections. ? Control itching. ? Reduce fluid buildup. ? Reduce certain blood toxins. ? Reduce risk of bleeding from enlarged blood vessels in the stomach or esophagus (varices).  If varices are causing bleeding problems, you may need treatment with a procedure that ties up the vessels causing them to fall off (band ligation).  If cirrhosis is causing your liver to fail, your health care provider may recommend a liver transplant.  Other treatments may be recommended depending on any complications of cirrhosis, such as liver-related kidney failure (hepatorenal  syndrome).  Follow these instructions at home:  Take medicines only as directed by your health care provider. Do not use drugs that are toxic to your liver. Ask your health care provider before taking any new medicines, including over-the-counter medicines.  Rest as needed.  Eat a well-balanced diet. Ask your health care provider or dietitian for more information.  You may have to follow a low-salt diet or restrict your water intake as directed.  Do not drink alcohol. This is especially important if you are taking acetaminophen.  Keep all follow-up visits as directed by your health care provider. This is important. Contact a health care provider if:  You have fatigue or weakness that is getting worse.  You develop swelling of the hands, feet, legs, or face.  You have a fever.  You develop loss of appetite.  You have nausea or vomiting.  You develop jaundice.  You develop easy bruising or bleeding. Get help right away if:  You vomit bright red blood or a material that looks like coffee grounds.  You have blood in your stools.  Your stools appear black and tarry.  You become confused.  You have chest pain or trouble breathing. This information is not intended to replace advice given to you by your health care provider. Make sure you discuss any questions you have with your health care provider. Document Released: 07/13/2005 Document Revised: 11/21/2015 Document Reviewed: 03/21/2014 Elsevier Interactive Patient Education  [employer].

## 2017-02-09 NOTE — Assessment & Plan Note (Signed)
hgba1c acceptable, minimize simple carbs. Increase exercise as tolerated. Continue current meds 

## 2017-02-09 NOTE — Progress Notes (Signed)
Subjective:  I acted as a Education administrator for Dr. Charlett Blake. [name], [place]  [name], female    DOB: Sep 30, 1940, 76 y.o.   MRN: [id number]  Chief Complaint  Patient presents with  . Follow-up    HPI  Patient is in today for a 4 week follow up. Patient states she is doing well. She had to present to the ER for mental status changes in June but after a double dose of lactulose and a large bowel movement her confusion improved and she has not had a recurrence. Has not seen her pulmonologist or gastroenterologist. Continues to note pedal edema, congestion and cough. She is frustrated with her weight loss. Notes some anorexia but no nausea or vomiting. Denies CP/palp/SOB/HA/congestion/fevers. Taking meds as prescribed    Patient Care Team: Mosie Lukes, MD as PCP - General (Family Medicine) Dene Gentry, MD as Consulting Physician (Sports Medicine) Mauri Pole, MD as Consulting Physician (Gastroenterology) Rigoberto Noel, MD as Consulting Physician (Pulmonary Disease) Parrett, Fonnie Mu, NP as Nurse Practitioner (Pulmonary Disease) Volanda Napoleon, MD as Consulting Physician (Oncology) Dannielle Karvonen, RN as [facility]   Past Medical History:  Diagnosis Date  . Allergic state 11/10/2016  . Anxiety   . Arthritis of both knees 10/01/2013  . Benign paroxysmal positional vertigo 10/01/2013  . Cancer [facility]) breast ca  right  . COPD (chronic obstructive pulmonary disease) ([place]) 10/01/2013  . Depression   . Diabetes mellitus type 2  . Emphysema   . Encephalopathy, hepatic ([place]) 06/07/2014  . Esophageal reflux 10/01/2013  . Fall 07/30/2016  . Hyperlipidemia   . Hyperlipidemia, mixed   . Increased ammonia level 11/25/2014  . NASH (nonalcoholic steatohepatitis) 08/26/2015  . Neck pain 10/01/2013  . Neuropathy    feet   . Overactive bladder 12/10/2013  . Panic attacks   . Pedal edema 12/10/2013  . Personal history of radiation therapy   . Preventative  health care 03/08/2016  . Tobacco abuse disorder 02/01/2014    Past Surgical History:  Procedure Laterality Date  . APPENDECTOMY  2007  . BREAST SURGERY  2009 right  . CATARACT EXTRACTION     x 2  . ESOPHAGOGASTRODUODENOSCOPY (EGD) WITH PROPOFOL N/A 08/14/2016   Procedure: ESOPHAGOGASTRODUODENOSCOPY (EGD) WITH PROPOFOL;  Surgeon: Mauri Pole, MD;  Location: [facility];  Service: Endoscopy;  Laterality: N/A;  . Hydro  . KNEE SURGERY    . MANDIBLE FRACTURE SURGERY    . MASTECTOMY    . PILONIDAL CYST EXCISION    . TONSILLECTOMY      Family History  Problem Relation Age of Onset  . Heart failure Father   . COPD Father   . Arthritis Father 30  . Stroke Mother   . Arthritis Mother 28  . Hyperlipidemia Mother   . Hypertension Mother   . Diabetes Mother   . Diabetes Sister   . Breast cancer Unknown   . Breast cancer Maternal Aunt   . Asthma Maternal Aunt   . Birth defects Maternal Aunt   . Alcohol abuse Maternal Uncle   . Breast cancer Maternal Aunt     Social History   Social History  . Marital status: Single    Spouse name: N/A  . Number of children: 0  . Years of education: N/A   Occupational History  . retired Retired   Social History Main Topics  . Smoking status: Current Some Day Smoker  Packs/day: 0.25    Years: 58.00    Types: Cigarettes    Start date: 07/27/1964  . Smokeless tobacco: Never Used     Comment: 1 pack per week, tobacco infor given 12/30/15  . Alcohol use No  . Drug use: No  . Sexual activity: No   Other Topics Concern  . Not on file   Social History Narrative   Lives alone, continues to smoke, no dietary restrictions    Outpatient Medications Prior to Visit  Medication Sig Dispense Refill  . albuterol (ACCUNEB) 1.25 MG/3ML nebulizer solution Take 3 mLs (1.25 mg total) by nebulization every 6 (six) hours as needed for wheezing. 75 mL 3  . albuterol (PROVENTIL HFA;VENTOLIN HFA) 108 (90 Base) MCG/ACT inhaler  Inhale 2 puffs into the lungs every 6 (six) hours as needed for wheezing or shortness of breath. Only dispense Ventolin 1 Inhaler 3  . BD PEN NEEDLE NANO U/F 32G X 4 MM MISC USE AS DIRECTED WITH LEVEMIR FLEXPEN 100 each 0  . Blood Glucose Monitoring Suppl (ONE TOUCH ULTRA 2) w/Device KIT Use as directed once daily to check blood sugar.  DX E11.9 1 each 0  . Cholecalciferol (VITAMIN D3) 2000 units TABS Take 2,000 Units by mouth every morning.    . fluticasone (FLONASE) 50 MCG/ACT nasal spray Place 2 sprays into both nostrils daily. (Patient taking differently: Place 2 sprays into both nostrils daily as needed for allergies. ) 16 g 6  . furosemide (LASIX) 20 MG tablet TAKE 1 TABLET BY MOUTH TWICE DAILY (Patient taking differently: TAKE 1 TABLET BY MOUTH TWICE DAILY PRN FOR SWELLING) 60 tablet 0  . lactulose (CHRONULAC) 10 GM/15ML solution TAKE 45 MLS BY MOUTH TWICE DAILY AS NEEDED FOR MILD CONSTIPATION. 1892 mL 6  . LEVEMIR FLEXTOUCH 100 UNIT/ML Pen INJECT 14 UNITS UNDER THE SKIN EVERY DAY AT 10PM 15 mL 0  . mupirocin ointment (BACTROBAN) 2 % Place on application over lesion on leg twice daily. 22 g 0  . omeprazole (PRILOSEC) 20 MG capsule TAKE 1 CAPSULE(20 MG) BY MOUTH DAILY 90 capsule 0  . Polyvinyl Alcohol (LUBRICANT DROPS OP) Apply 1 drop to eye daily as needed (dry eyes).    . rifaximin (XIFAXAN) 550 MG TABS tablet Take 1 tablet (550 mg total) by mouth 2 (two) times daily. 60 tablet 11  . spironolactone (ALDACTONE) 50 MG tablet TAKE 2 TABLETS(100 MG) BY MOUTH DAILY 60 tablet 0  . cefpodoxime (VANTIN) 200 MG tablet     . ONE TOUCH ULTRA TEST test strip CHECK BLOOD SUGAR TWICE DAILY AS DIRECTED 100 each 11   No facility-administered medications prior to visit.     Allergies  Allergen Reactions  . Citalopram Palpitations    Irregular heart beat  . Erythromycin Other (See Comments)    Stomach cramps  . Glimepiride     Elevated ammonia levels  . Prednisone     Increased blood sugars too  high  . Versed [Midazolam] Other (See Comments)    Patient stayed confusion stayed 4+days     Review of Systems  Constitutional: Positive for malaise/fatigue. Negative for fever.  HENT: Negative for congestion.   Eyes: Negative for blurred vision.  Respiratory: Positive for cough and shortness of breath.   Cardiovascular: Positive for leg swelling. Negative for chest pain and palpitations.  Gastrointestinal: Negative for abdominal pain, blood in stool and nausea.  Genitourinary: Negative for dysuria and frequency.  Musculoskeletal: Negative for falls.  Skin: Negative for rash.  Neurological:  Negative for dizziness, loss of consciousness and headaches.  Endo/Heme/Allergies: Negative for environmental allergies.  Psychiatric/Behavioral: Negative for depression. The patient is not nervous/anxious.        Objective:    Physical Exam  Constitutional: She is oriented to person, place, and time. She appears well-developed and well-nourished. No distress.  HENT:  Head: Normocephalic and atraumatic.  Nose: Nose normal.  Eyes: Right eye exhibits no discharge. Left eye exhibits no discharge.  Neck: Normal range of motion. Neck supple.  Cardiovascular: Normal rate and regular rhythm.   Pulmonary/Chest: Effort normal and breath sounds normal.  Abdominal: Soft. Bowel sounds are normal. There is no tenderness.  Musculoskeletal: She exhibits edema.  1-2 plus pedal edema b/l  Neurological: She is alert and oriented to person, place, and time.  Skin: Skin is warm and dry.  Psychiatric: She has a normal mood and affect.  Nursing note and vitals reviewed.   BP (!) 118/58 (BP Location: Left Arm, Patient Position: Sitting, Cuff Size: Normal)   Pulse 83   Temp 98 F (36.7 C) (Oral)   Resp 18   Wt 145 lb (65.8 kg)   SpO2 96%   BMI 23.40 kg/m  Wt Readings from Last 3 Encounters:  02/09/17 145 lb (65.8 kg)  01/17/17 140 lb (63.5 kg)  01/07/17 141 lb 12.8 oz (64.3 kg)   BP Readings from  Last 3 Encounters:  02/09/17 (!) 118/58  01/17/17 (!) 122/53  01/07/17 (!) 102/58     Immunization History  Administered Date(s) Administered  . Influenza Split 04/09/2011, 03/27/2012, 03/27/2013  . Influenza,inj,Quad PF,36+ Mos 03/26/2015  . Influenza-Unspecified 04/24/2014, 04/23/2016  . Pneumococcal Conjugate-13 02/01/2014  . Pneumococcal Polysaccharide-23 04/09/2011  . Td 07/27/2010  . Tdap 05/08/2014    Health Maintenance  Topic Date Due  . FOOT EXAM  11/24/2016  . INFLUENZA VACCINE  02/24/2017  . URINE MICROALBUMIN  02/24/2017  . OPHTHALMOLOGY EXAM  05/06/2017  . HEMOGLOBIN A1C  05/12/2017  . MAMMOGRAM  01/24/2018  . COLONOSCOPY  07/27/2020  . TETANUS/TDAP  05/08/2024  . DEXA SCAN  Completed  . PNA vac Low Risk Adult  Completed    Lab Results  Component Value Date   WBC 6.1 01/21/2017   HGB 14.3 01/21/2017   HCT 42.3 01/21/2017   PLT 126.0 (L) 01/21/2017   GLUCOSE 165 (H) 01/21/2017   CHOL 164 11/10/2016   TRIG 85.0 11/10/2016   HDL 40.60 11/10/2016   LDLCALC 106 (H) 11/10/2016   ALT 26 01/21/2017   AST 35 01/21/2017   NA 138 01/21/2017   K 4.2 01/21/2017   CL 104 01/21/2017   CREATININE 0.88 01/21/2017   BUN 16 01/21/2017   CO2 28 01/21/2017   TSH 1.16 11/10/2016   INR 1.2 (H) 12/11/2015   HGBA1C 5.9 11/10/2016   MICROALBUR <0.7 02/25/2016    Lab Results  Component Value Date   TSH 1.16 11/10/2016   Lab Results  Component Value Date   WBC 6.1 01/21/2017   HGB 14.3 01/21/2017   HCT 42.3 01/21/2017   MCV 98.0 01/21/2017   PLT 126.0 (L) 01/21/2017   Lab Results  Component Value Date   NA 138 01/21/2017   K 4.2 01/21/2017   CHLORIDE 104 06/29/2016   CO2 28 01/21/2017   GLUCOSE 165 (H) 01/21/2017   BUN 16 01/21/2017   CREATININE 0.88 01/21/2017   BILITOT 2.2 (H) 01/21/2017   ALKPHOS 121 (H) 01/21/2017   AST 35 01/21/2017   ALT 26 01/21/2017  PROT 5.1 (L) 01/21/2017   ALBUMIN 2.6 (L) 01/21/2017   CALCIUM 8.9 01/21/2017   ANIONGAP 9  01/17/2017   EGFR 50 (L) 06/29/2016   GFR 66.80 01/21/2017   Lab Results  Component Value Date   CHOL 164 11/10/2016   Lab Results  Component Value Date   HDL 40.60 11/10/2016   Lab Results  Component Value Date   LDLCALC 106 (H) 11/10/2016   Lab Results  Component Value Date   TRIG 85.0 11/10/2016   Lab Results  Component Value Date   CHOLHDL 4 11/10/2016   Lab Results  Component Value Date   HGBA1C 5.9 11/10/2016         Assessment & Plan:   Problem List Items Addressed This Visit    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 (Chronic)    Is following with [facility] pulmonology, Dr Elsworth Soho and is struggling with the humidity this year. Is tolerating Spiriva but with her increased symptoms so they are going to try switching meds to a dual ingredient inhaler.       Relevant Medications   montelukast (SINGULAIR) 10 MG tablet   Diabetes mellitus type 2, controlled (HCC) (Chronic)    hgba1c acceptable, minimize simple carbs. Increase exercise as tolerated. Continue current meds      Relevant Orders   Hemoglobin A1c   Liver cirrhosis secondary to NASH ([place]) (Chronic)    Continues to follow with [facility], had a trip to ER in June of 2018 with AMS she was sent home, took a double dose of Lactulose and had significant bowel movement and her mental status improved. She feels well today, Tolerating Xifaxan has been improved with this.       Relevant Orders   CBC   Comprehensive metabolic panel   Hyperlipidemia, mixed    Encouraged heart healthy diet, increase exercise, avoid trans fats, consider a krill oil cap daily      Relevant Orders   Lipid panel   Pedal edema    Encouraged to put feet up, minimize sodium intake. Continue current meds and compression hose daily      Tobacco abuse disorder    Encouraged complete cessation. Discussed need to quit as relates to risk of numerous cancers, cardiac and pulmonary disease as well as neurologic  complications. Counseled for greater than 3 minutes         I have discontinued Ms. Scroggs cefpodoxime. I have also changed her ONE TOUCH ULTRA TEST to glucose blood. Additionally, I am having her maintain her albuterol, lactulose, Vitamin D3, Polyvinyl Alcohol (LUBRICANT DROPS OP), furosemide, BD PEN NEEDLE NANO U/F, rifaximin, ONE TOUCH ULTRA 2, fluticasone, albuterol, LEVEMIR FLEXTOUCH, mupirocin ointment, omeprazole, spironolactone, and montelukast.  Meds ordered this encounter  Medications  . montelukast (SINGULAIR) 10 MG tablet    Sig: Take 1 tablet by mouth daily.  Marland Kitchen glucose blood (ONE TOUCH ULTRA TEST) test strip    Sig: CHECK BLOOD SUGAR TWICE DAILY AS DIRECTED    Dispense:  100 each    Refill:  11    CMA served as scribe during this visit. History, Physical and Plan performed by medical provider. Documentation and orders reviewed and attested to.  Penni Homans, MD

## 2017-02-09 NOTE — Patient Outreach (Signed)
[facility]) Care Management  02/09/2017  Ashley Savage 10-14-1940 [id number]   Received return voice mail message from patient.  RNCM attempted telephone outreach to patient. Unable to reach. HIPAA compliant voice message left with call back phone number.   PLAN:  RNCM will send patient outreach letter to attempt contact.   [name] RN,BSN,CCM [facility] Telephonic  [phone]

## 2017-02-09 NOTE — Assessment & Plan Note (Signed)
Encouraged complete cessation. Discussed need to quit as relates to risk of numerous cancers, cardiac and pulmonary disease as well as neurologic complications. Counseled for greater than 3 minutes 

## 2017-02-09 NOTE — Assessment & Plan Note (Signed)
Encouraged to put feet up, minimize sodium intake. Continue current meds and compression hose daily

## 2017-02-09 NOTE — Assessment & Plan Note (Signed)
Continues to follow with [facility], had a trip to ER in June of 2018 with AMS she was sent home, took a double dose of Lactulose and had significant bowel movement and her mental status improved. She feels well today, Tolerating Xifaxan has been improved with this.

## 2017-02-09 NOTE — Assessment & Plan Note (Signed)
Is following with [facility] pulmonology, Dr Elsworth Soho and is struggling with the humidity this year. Is tolerating Spiriva but with her increased symptoms so they are going to try switching meds to a dual ingredient inhaler.

## 2017-02-09 NOTE — Assessment & Plan Note (Signed)
Encouraged heart healthy diet, increase exercise, avoid trans fats, consider a krill oil cap daily 

## 2017-02-10 ENCOUNTER — Other Ambulatory Visit: Payer: Self-pay

## 2017-02-10 NOTE — Patient Outreach (Signed)
[facility]) Care Management  [place]  02/10/2017   Ashley Savage 11-15-1940 [id number]  Subjective: Telephone call to patient regarding nurse call line follow up and monthly assessment. HIPAA verified with patient. Patient states she saw her primary MD on yesterday.  Patient denies having urinary frequency symptoms at this time. Patient states her doctor checked her urine and blood work. Patient reports her results were negative.  Patient states her primary MD requested she start taking a calcium supplement.  Patient states she is scheduled to see a urologist in August 2018 to address her urinary incontinence issue. Patient states she has not seen her gastroenterologist since January 2018. Patient states she would like to know if she is managing the cirrhosis of her liver or if the disease is progressing. Patient states she has attempted to schedule and appointment but has been told Dr. Silverio Decamp does not have any appointment.  RNCM offered to contact Dr. Woodward Ku office for patient and arrange appointment. Patient verbally agreed.  Patient denies having any new symptoms at this time. Patient able to state symptoms that would need to be reported to her doctor if she was having problems. Patient states, constipation, shaky and confusion.   Patient states her action plan is to take an extra dose of lactulose if she starts having symptoms.  Patient states she spoke with her pulmonologist office on Monday 02/08/17 to request another medication to help her COPD during the summer. Patient states she has a little more shortness of breath during the warmer times of year. Patient states she is waiting to hear back from the pulmonologist office.  Patient states she made an appointment with a dermatologist because she has a lot of problem with dry skin.  States appointment is in August 2018.  Patient states she is trying to gain weight.  States she is drinking high protein low sugar  supplement drinks.   RNCM called Dr. Woodward Ku office and spoke with Altha Harm. Appointment scheduled for patient to see Dr. Woodward Ku physician assistant on 02/19/17 at 28: [place] called patient to inform her of her appointment with Dr. Woodward Ku office. Patient voiced appreciation for scheduling appointment. Patient verbally agreed to next telephone outreach with [facility].   Objective: n/a  Encounter Medications:  Outpatient Encounter Prescriptions as of 02/10/2017  Medication Sig Note  . albuterol (ACCUNEB) 1.25 MG/3ML nebulizer solution Take 3 mLs (1.25 mg total) by nebulization every 6 (six) hours as needed for wheezing.   Marland Kitchen albuterol (PROVENTIL HFA;VENTOLIN HFA) 108 (90 Base) MCG/ACT inhaler Inhale 2 puffs into the lungs every 6 (six) hours as needed for wheezing or shortness of breath. Only dispense Ventolin   . BD PEN NEEDLE NANO U/F 32G X 4 MM MISC USE AS DIRECTED WITH LEVEMIR FLEXPEN   . Blood Glucose Monitoring Suppl (ONE TOUCH ULTRA 2) w/Device KIT Use as directed once daily to check blood sugar.  DX E11.9   . Cholecalciferol (VITAMIN D3) 2000 units TABS Take 2,000 Units by mouth every morning.   . fluticasone (FLONASE) 50 MCG/ACT nasal spray Place 2 sprays into both nostrils daily. (Patient taking differently: Place 2 sprays into both nostrils daily as needed for allergies. )   . furosemide (LASIX) 20 MG tablet TAKE 1 TABLET BY MOUTH TWICE DAILY (Patient taking differently: TAKE 1 TABLET BY MOUTH TWICE DAILY PRN FOR SWELLING) 02/10/2017: Patient states she takes as needed.    Marland Kitchen glucose blood (ONE TOUCH ULTRA TEST) test strip CHECK BLOOD SUGAR TWICE  DAILY AS DIRECTED   . lactulose (CHRONULAC) 10 GM/15ML solution TAKE 45 MLS BY MOUTH TWICE DAILY AS NEEDED FOR MILD CONSTIPATION. 12/24/2016: Patient takes differently. Takes once daily as directed by her doctor  . LEVEMIR FLEXTOUCH 100 UNIT/ML Pen INJECT 14 UNITS UNDER THE SKIN EVERY DAY AT 10PM   . mupirocin ointment (BACTROBAN) 2 %  Place on application over lesion on leg twice daily. 02/10/2017: Patient states she uses as needed   . omeprazole (PRILOSEC) 20 MG capsule TAKE 1 CAPSULE(20 MG) BY MOUTH DAILY 02/10/2017: Patient states she takes as needed.   . Polyvinyl Alcohol (LUBRICANT DROPS OP) Apply 1 drop to eye daily as needed (dry eyes).   . rifaximin (XIFAXAN) 550 MG TABS tablet Take 1 tablet (550 mg total) by mouth 2 (two) times daily.   Marland Kitchen spironolactone (ALDACTONE) 50 MG tablet TAKE 2 TABLETS(100 MG) BY MOUTH DAILY   . montelukast (SINGULAIR) 10 MG tablet Take 1 tablet by mouth daily.    No facility-administered encounter medications on file as of 02/10/2017.     Functional Status:  In your present state of health, do you have any difficulty performing the following activities: 12/07/2016 11/25/2016  Hearing? Y N  Vision? N N  Difficulty concentrating or making decisions? N Y  Walking or climbing stairs? Y Y  Dressing or bathing? N Y  Doing errands, shopping? N Y  Conservation officer, nature and eating ? N -  Using the Toilet? N -  In the past six months, have you accidently leaked urine? Y -  Do you have problems with loss of bowel control? N -  Managing your Medications? N -  Managing your Finances? N -  Housekeeping or managing your Housekeeping? N -  Some recent data might be hidden    Fall/Depression Screening: Fall Risk  02/10/2017 12/07/2016 10/22/2016  Falls in the past year? No Yes Yes  Number falls in past yr: - 1 1  Injury with Fall? - Yes Yes  Risk Factor Category  High Fall Risk High Fall Risk -  Risk for fall due to : History of fall(s) History of fall(s) Impaired balance/gait;History of fall(s)  Risk for fall due to (comments): - - -  Follow up Falls prevention discussed Falls prevention discussed Education provided;Falls prevention discussed   PHQ 2/9 Scores 11/20/2016 10/22/2016 05/04/2016 02/25/2016 02/08/2015 02/08/2015 12/26/2014  PHQ - 2 Score 0 1 0 0 1 0 0    Assessment:  Ongoing education / management  for cirrhosis of liver  Plan: RNCM will follow up with patient within in the month of August 2018. RNCM will send patient EMMI education material on high protein diet.   [name] RN,BSN,CCM [facility] Telephonic  [phone]

## 2017-02-10 NOTE — Telephone Encounter (Signed)
[facility]. Anoro is not covered but Stiolto is. Went pt a message asking what pharmacy she wanted to use so I can call and ask how much the medication will cost. Will have samples in [place] for her next Thursday.

## 2017-02-15 ENCOUNTER — Other Ambulatory Visit: Payer: Self-pay

## 2017-02-15 MED ORDER — TIOTROPIUM BROMIDE-OLODATEROL 2.5-2.5 MCG/ACT IN AERS: 2.0000 | INHALATION_SPRAY | Freq: Every day | RESPIRATORY_TRACT | 0 refills | Status: DC

## 2017-02-19 ENCOUNTER — Other Ambulatory Visit (INDEPENDENT_AMBULATORY_CARE_PROVIDER_SITE_OTHER): Payer: PPO

## 2017-02-19 ENCOUNTER — Other Ambulatory Visit: Payer: PPO

## 2017-02-19 ENCOUNTER — Encounter: Payer: Self-pay | Admitting: Gastroenterology

## 2017-02-19 ENCOUNTER — Ambulatory Visit (INDEPENDENT_AMBULATORY_CARE_PROVIDER_SITE_OTHER): Payer: PPO | Admitting: Gastroenterology

## 2017-02-19 VITALS — BP 114/48 | HR 80 | Ht 64.5 in | Wt 148.0 lb

## 2017-02-19 DIAGNOSIS — K7460 Unspecified cirrhosis of liver: Secondary | ICD-10-CM

## 2017-02-19 DIAGNOSIS — K7682 Hepatic encephalopathy: Secondary | ICD-10-CM

## 2017-02-19 DIAGNOSIS — K7290 Hepatic failure, unspecified without coma: Secondary | ICD-10-CM | POA: Diagnosis not present

## 2017-02-19 LAB — PROTIME-INR
INR: 1.3 ratio — ABNORMAL HIGH (ref 0.8–1.0)
Prothrombin Time: 14.2 s — ABNORMAL HIGH (ref 9.6–13.1)

## 2017-02-19 LAB — BASIC METABOLIC PANEL
BUN: 14 mg/dL (ref 6–23)
CALCIUM: 8.8 mg/dL (ref 8.4–10.5)
CO2: 29 meq/L (ref 19–32)
Chloride: 106 mEq/L (ref 96–112)
Creatinine, Ser: 1.02 mg/dL (ref 0.40–1.20)
GFR: 56.32 mL/min — AB (ref >60.00)
GLUCOSE: 166 mg/dL — AB (ref 70–99)
Potassium: 4.3 mEq/L (ref 3.5–5.1)
Sodium: 139 mEq/L (ref 135–145)

## 2017-02-19 NOTE — Progress Notes (Signed)
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  She has not been seen in our office since December 2017 and had an EGD in January 2018.  She is currently taking lactulose 45 mL daily with extra doses, only rarely if needed, and Xifaxan 550 mg twice a day for her encephalopathy. In regards to her lower extremity swelling she had briefly been on Lasix 20 g twice a day and spironolactone 50 mg twice a day, but about 2 weeks ago she discontinued the Lasix and is only using that on rare occasion if needed. She continues the spironolactone and has been wearing compression stockings throughout the day. Overall she feels well. She has not had too many issues with increased confusion. Denies any abdominal pain, dark or bloody stools. Recent CBC shows slightly low platelets at 113. CMP with stable renal function, lactulose, and hepatic function.  EGD in January 2018 showed the following: -Grade I esophageal varices. -Non-bleeding esophageal ulcer. -5 cm hiatal hernia -Portal hypertensive gastropathy. -Multiple non-bleeding duodenal ulcers.  Is on daily PPI.   Past Medical History:  Diagnosis Date  . Allergic state 11/10/2016  . Anxiety   . Arthritis of both knees 10/01/2013  . Benign paroxysmal positional vertigo 10/01/2013  . Cancer [facility]) breast ca  right  . COPD (chronic obstructive pulmonary disease) ([place]) 10/01/2013  . Depression   . Diabetes mellitus type 2  . Emphysema   . Encephalopathy, hepatic ([place]) 06/07/2014  . Esophageal reflux 10/01/2013  . Fall 07/30/2016  . Hyperlipidemia   . Hyperlipidemia, mixed   . Increased ammonia level 11/25/2014  . NASH (nonalcoholic steatohepatitis) 08/26/2015  . Neck pain 10/01/2013  . Neuropathy    feet   . Overactive bladder  12/10/2013  . Panic attacks   . Pedal edema 12/10/2013  . Personal history of radiation therapy   . Preventative health care 03/08/2016  . Tobacco abuse disorder 02/01/2014   Past Surgical History:  Procedure Laterality Date  . APPENDECTOMY  2007  . BREAST SURGERY  2009 right  . CATARACT EXTRACTION     x 2  . ESOPHAGOGASTRODUODENOSCOPY (EGD) WITH PROPOFOL N/A 08/14/2016   Procedure: ESOPHAGOGASTRODUODENOSCOPY (EGD) WITH PROPOFOL;  Surgeon: Mauri Pole, MD;  Location: [facility];  Service: Endoscopy;  Laterality: N/A;  . [place]  . KNEE SURGERY    . MANDIBLE FRACTURE SURGERY    . MASTECTOMY    . PILONIDAL CYST EXCISION    . TONSILLECTOMY      reports that she has been smoking Cigarettes.  She started smoking about 52 years ago. She has a 14.50 pack-year smoking history. She has never used smokeless tobacco. She reports that she does not drink alcohol or use drugs. family history includes Alcohol abuse in her maternal uncle; Arthritis (age of onset: 36) in her father and mother; Asthma in her maternal aunt; Birth defects in her maternal aunt; Breast cancer in her maternal aunt, maternal aunt, and unknown relative; COPD in her father; Diabetes in her mother and sister; Heart failure in her father; Hyperlipidemia in her mother; Hypertension in her mother; Stroke in her mother. Allergies  Allergen Reactions  . Citalopram Palpitations    Irregular heart beat  . Erythromycin Other (See Comments)    Stomach cramps  .  Glimepiride     Elevated ammonia levels  . Prednisone     Increased blood sugars too high  . Versed [Midazolam] Other (See Comments)    Patient stayed confusion stayed 4+days       Outpatient Encounter Prescriptions as of 02/19/2017  Medication Sig  . albuterol (ACCUNEB) 1.25 MG/3ML nebulizer solution Take 3 mLs (1.25 mg total) by nebulization every 6 (six) hours as needed for wheezing.  Marland Kitchen albuterol (PROVENTIL HFA;VENTOLIN HFA) 108 (90 Base) MCG/ACT  inhaler Inhale 2 puffs into the lungs every 6 (six) hours as needed for wheezing or shortness of breath. Only dispense Ventolin  . BD PEN NEEDLE NANO U/F 32G X 4 MM MISC USE AS DIRECTED WITH LEVEMIR FLEXPEN  . Blood Glucose Monitoring Suppl (ONE TOUCH ULTRA 2) w/Device KIT Use as directed once daily to check blood sugar.  DX E11.9  . Cholecalciferol (VITAMIN D3) 2000 units TABS Take 2,000 Units by mouth every morning.  . fluticasone (FLONASE) 50 MCG/ACT nasal spray Place 2 sprays into both nostrils daily. (Patient taking differently: Place 2 sprays into both nostrils daily as needed for allergies. )  . furosemide (LASIX) 20 MG tablet TAKE 1 TABLET BY MOUTH TWICE DAILY (Patient taking differently: TAKE 1 TABLET BY MOUTH TWICE DAILY PRN FOR SWELLING)  . glucose blood (ONE TOUCH ULTRA TEST) test strip CHECK BLOOD SUGAR TWICE DAILY AS DIRECTED  . lactulose (CHRONULAC) 10 GM/15ML solution TAKE 45 MLS BY MOUTH TWICE DAILY AS NEEDED FOR MILD CONSTIPATION.  Marland Kitchen LEVEMIR FLEXTOUCH 100 UNIT/ML Pen INJECT 14 UNITS UNDER THE SKIN EVERY DAY AT 10PM  . mupirocin ointment (BACTROBAN) 2 % Place on application over lesion on leg twice daily.  Marland Kitchen omeprazole (PRILOSEC) 20 MG capsule TAKE 1 CAPSULE(20 MG) BY MOUTH DAILY  . Polyvinyl Alcohol (LUBRICANT DROPS OP) Apply 1 drop to eye daily as needed (dry eyes).  . rifaximin (XIFAXAN) 550 MG TABS tablet Take 1 tablet (550 mg total) by mouth 2 (two) times daily.  Marland Kitchen spironolactone (ALDACTONE) 50 MG tablet TAKE 2 TABLETS(100 MG) BY MOUTH DAILY  . Tiotropium Bromide-Olodaterol (STIOLTO RESPIMAT) 2.5-2.5 MCG/ACT AERS Inhale 2 puffs into the lungs daily.  . [DISCONTINUED] montelukast (SINGULAIR) 10 MG tablet Take 1 tablet by mouth daily.   No facility-administered encounter medications on file as of 02/19/2017.      REVIEW OF SYSTEMS  : All other systems reviewed and negative except where noted in the History of Present Illness.   PHYSICAL EXAM: BP (!) 114/48 (BP Location:  Left Arm, Patient Position: Sitting, Cuff Size: Normal)   Pulse 80   Ht 5' 4.5" (1.638 m) Comment: height measured without shoes  Wt 148 lb (67.1 kg)   BMI 25.01 kg/m  General: Well developed white female in no acute distress Head: Normocephalic and atraumatic Eyes:  Sclerae anicteric, conjunctiva pink. Ears: Normal auditory acuity Lungs: Wheezing noted B/L Heart: Regular rate and rhythm; no M/R/G. Abdomen: Soft, non-distended.  BS present.  Non-tender. Musculoskeletal: Symmetrical with no gross deformities  Skin: No lesions on visible extremities Extremities: No edema  Neurological: Alert oriented x 4, grossly non-focal.  No asterixis noted. Psychological:  Alert and cooperative. 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  Overall things seem to be stable. She does need up-to-date screening for [facility] so we will schedule an ultrasound and check an  AFP. She will continue her lactulose and Xifaxan at her current doses for her encephalopathy. In regards to her diuretics, she has changed those on her own and is currently only using Lasix as needed, spironolactone 50 mg twice a day and is wearing compression stockings throughout the day. We have advised her that she should be monitoring her weight on a daily basis. She continues to be compliant with low-sodium diet.  We will check a BMP and PT/INR today as well. She will follow up in 3 months with dr. Silverio Decamp.   CC:  Mosie Lukes, MD

## 2017-02-19 NOTE — Patient Instructions (Signed)
You have been scheduled for an abdominal ultrasound at [facility] on 02-20-17 at 9:30am. Please arrive 15 minutes prior to your appointment for registration. Make certain not to have anything to eat or drink 6 hours prior to your appointment. Should you need to reschedule your appointment, please contact radiology at [phone]. This test typically takes about 30 minutes to perform.  Your physician has requested that you go to the basement for lab work before leaving today.

## 2017-02-20 ENCOUNTER — Ambulatory Visit (HOSPITAL_BASED_OUTPATIENT_CLINIC_OR_DEPARTMENT_OTHER)
Admission: RE | Admit: 2017-02-20 | Discharge: 2017-02-20 | Disposition: A | Discharge status: Home / Self Care | Payer: PPO | Source: Ambulatory Visit | Attending: Gastroenterology | Admitting: Gastroenterology

## 2017-02-20 DIAGNOSIS — K7460 Unspecified cirrhosis of liver: Secondary | ICD-10-CM | POA: Diagnosis not present

## 2017-02-20 DIAGNOSIS — I714 Abdominal aortic aneurysm, without rupture: Secondary | ICD-10-CM | POA: Insufficient documentation

## 2017-02-22 LAB — AFP TUMOR MARKER: AFP-Tumor Marker: 3.4 ng/mL (ref <6.1)

## 2017-02-26 NOTE — Progress Notes (Signed)
Reviewed and agree with documentation and assessment and plan. K. Veena Jillian Pianka , MD   

## 2017-02-28 ENCOUNTER — Encounter: Payer: Self-pay | Admitting: Pulmonary Disease

## 2017-03-01 ENCOUNTER — Encounter: Payer: Self-pay | Admitting: Gastroenterology

## 2017-03-02 DIAGNOSIS — L218 Other seborrheic dermatitis: Secondary | ICD-10-CM | POA: Diagnosis not present

## 2017-03-02 DIAGNOSIS — L853 Xerosis cutis: Secondary | ICD-10-CM | POA: Diagnosis not present

## 2017-03-03 ENCOUNTER — Ambulatory Visit: Payer: Self-pay

## 2017-03-10 ENCOUNTER — Ambulatory Visit: Payer: Self-pay

## 2017-03-11 ENCOUNTER — Encounter (HOSPITAL_BASED_OUTPATIENT_CLINIC_OR_DEPARTMENT_OTHER): Payer: Self-pay | Admitting: *Deleted

## 2017-03-11 ENCOUNTER — Emergency Department (HOSPITAL_BASED_OUTPATIENT_CLINIC_OR_DEPARTMENT_OTHER)
Admission: EM | Admit: 2017-03-11 | Discharge: 2017-03-11 | Disposition: A | Discharge status: Home / Self Care | Payer: PPO | Attending: Emergency Medicine | Admitting: Emergency Medicine

## 2017-03-11 DIAGNOSIS — F17210 Nicotine dependence, cigarettes, uncomplicated: Secondary | ICD-10-CM | POA: Diagnosis not present

## 2017-03-11 DIAGNOSIS — Z79899 Other long term (current) drug therapy: Secondary | ICD-10-CM | POA: Diagnosis not present

## 2017-03-11 DIAGNOSIS — R4182 Altered mental status, unspecified: Secondary | ICD-10-CM | POA: Diagnosis present

## 2017-03-11 DIAGNOSIS — R410 Disorientation, unspecified: Secondary | ICD-10-CM | POA: Diagnosis not present

## 2017-03-11 DIAGNOSIS — J449 Chronic obstructive pulmonary disease, unspecified: Secondary | ICD-10-CM | POA: Diagnosis not present

## 2017-03-11 DIAGNOSIS — R3915 Urgency of urination: Secondary | ICD-10-CM | POA: Diagnosis not present

## 2017-03-11 DIAGNOSIS — R069 Unspecified abnormalities of breathing: Secondary | ICD-10-CM | POA: Diagnosis not present

## 2017-03-11 DIAGNOSIS — E119 Type 2 diabetes mellitus without complications: Secondary | ICD-10-CM | POA: Insufficient documentation

## 2017-03-11 LAB — CBC WITH DIFFERENTIAL/PLATELET
BASOS PCT: 1 %
Basophils Absolute: 0.1 10*3/uL (ref 0.0–0.1)
EOS PCT: 3 %
Eosinophils Absolute: 0.2 10*3/uL (ref 0.0–0.7)
HEMATOCRIT: 41.8 % (ref 36.0–46.0)
Hemoglobin: 14.5 g/dL (ref 12.0–15.0)
LYMPHS ABS: 1.4 10*3/uL (ref 0.7–4.0)
LYMPHS PCT: 20 %
MCH: 32.1 pg (ref 26.0–34.0)
MCHC: 34.7 g/dL (ref 30.0–36.0)
MCV: 92.5 fL (ref 78.0–100.0)
MONOS PCT: 13 %
Monocytes Absolute: 0.9 10*3/uL (ref 0.1–1.0)
NEUTROS ABS: 4.4 10*3/uL (ref 1.7–7.7)
Neutrophils Relative %: 63 %
Platelets: 111 10*3/uL — ABNORMAL LOW (ref 150–400)
RBC: 4.52 MIL/uL (ref 3.87–5.11)
RDW: 15.6 % — AB (ref 11.5–15.5)
WBC: 7 10*3/uL (ref 4.0–10.5)

## 2017-03-11 LAB — COMPREHENSIVE METABOLIC PANEL
ALBUMIN: 3 g/dL — AB (ref 3.5–5.0)
ALT: 35 U/L (ref 14–54)
AST: 54 U/L — AB (ref 15–41)
Alkaline Phosphatase: 153 U/L — ABNORMAL HIGH (ref 38–126)
Anion gap: 10 (ref 5–15)
BILIRUBIN TOTAL: 3.7 mg/dL — AB (ref 0.3–1.2)
BUN: 20 mg/dL (ref 6–20)
CHLORIDE: 101 mmol/L (ref 101–111)
CO2: 27 mmol/L (ref 22–32)
Calcium: 9.3 mg/dL (ref 8.9–10.3)
Creatinine, Ser: 0.98 mg/dL (ref 0.44–1.00)
GFR calc Af Amer: >60 mL/min (ref >60)
GFR calc non Af Amer: 56 mL/min — ABNORMAL LOW (ref >60)
GLUCOSE: 149 mg/dL — AB (ref 65–99)
POTASSIUM: 4.8 mmol/L (ref 3.5–5.1)
SODIUM: 138 mmol/L (ref 135–145)
Total Protein: 6 g/dL — ABNORMAL LOW (ref 6.5–8.1)

## 2017-03-11 LAB — URINALYSIS, COMPLETE (UACMP) WITH MICROSCOPIC
GLUCOSE, UA: NEGATIVE mg/dL
Hgb urine dipstick: NEGATIVE
KETONES UR: 15 mg/dL — AB
LEUKOCYTES UA: NEGATIVE
NITRITE: NEGATIVE
PH: 5.5 (ref 5.0–8.0)
PROTEIN: NEGATIVE mg/dL
Specific Gravity, Urine: 1.023 (ref 1.005–1.030)

## 2017-03-11 LAB — AMMONIA: Ammonia: 65 umol/L — ABNORMAL HIGH (ref 9–35)

## 2017-03-11 LAB — CBG MONITORING, ED: GLUCOSE-CAPILLARY: 144 mg/dL — AB (ref 65–99)

## 2017-03-11 MED ORDER — SODIUM CHLORIDE 0.9 % IV BOLUS (SEPSIS)
500.0000 mL | Freq: Once | INTRAVENOUS | Status: AC
  Administered 2017-03-11: 500 mL via INTRAVENOUS

## 2017-03-11 MED ORDER — SODIUM CHLORIDE 0.9 % IV BOLUS (SEPSIS): 1000.0000 mL | Freq: Once | INTRAVENOUS | Status: DC

## 2017-03-11 MED ORDER — SODIUM CHLORIDE 0.9 % IV SOLN
INTRAVENOUS | Status: DC
  Administered 2017-03-11: 12:00:00 via INTRAVENOUS

## 2017-03-11 NOTE — ED Notes (Signed)
Pt states she feels her confusion is gone; pt's speech has also improved.

## 2017-03-11 NOTE — ED Notes (Signed)
ED Provider at bedside. 

## 2017-03-11 NOTE — ED Provider Notes (Signed)
[place] DEPT [facility] Provider Note   CSN: [id number] Arrival date & time: 03/11/17  1008     History   Chief Complaint Chief Complaint  Patient presents with  . Altered Mental Status    HPI Ashley Savage is a 76 y.o. female.  The history is provided by the patient.  Altered Mental Status   This is a recurrent problem. The current episode started yesterday. The problem has been gradually improving. Associated symptoms include confusion. Pertinent negatives include no unresponsiveness, no delusions, no hallucinations, no self-injury and no violence. Risk factors: h/o liver dz. Her past medical history is significant for liver disease.   Improving after taking lactulose and having BMs.   Past Medical History:  Diagnosis Date  . Allergic state 11/10/2016  . Anxiety   . Arthritis of both knees 10/01/2013  . Benign paroxysmal positional vertigo 10/01/2013  . Cancer [facility]) breast ca  right  . COPD (chronic obstructive pulmonary disease) ([place]) 10/01/2013  . Depression   . Diabetes mellitus type 2  . Emphysema   . Encephalopathy, hepatic ([place]) 06/07/2014  . Esophageal reflux 10/01/2013  . Fall 07/30/2016  . Hyperlipidemia   . Hyperlipidemia, mixed   . Increased ammonia level 11/25/2014  . NASH (nonalcoholic steatohepatitis) 08/26/2015  . Neck pain 10/01/2013  . Neuropathy    feet   . Overactive bladder 12/10/2013  . Panic attacks   . Pedal edema 12/10/2013  . Personal history of radiation therapy   . Preventative health care 03/08/2016  . Tobacco abuse disorder 02/01/2014    Patient Active Problem List   Diagnosis Date Noted  . Allergic state 11/10/2016  . 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 Portal hypertensive gastropathy ([place])   . Lower back injury, initial encounter 08/05/2016  . Fall 07/30/2016  . Preventative health care 03/08/2016  . Muscle spasm 02/25/2016  . Chronic respiratory failure ([place]) 08/29/2015  . NASH (nonalcoholic steatohepatitis) 08/26/2015  . Type 2  diabetes mellitus with hyperglycemia, with long-term current use of insulin ([place])   . Cirrhosis of liver without ascites ([place]) 07/31/2015  . Diarrhea 12/30/2014  . Increased ammonia level 11/25/2014  . Diabetes mellitus type 2, controlled ([place]) 10/16/2014  . Superficial bruising 10/04/2014  . Encephalopathy, hepatic ([place]) 06/07/2014  . Right knee pain 06/07/2014  . Peripheral edema 03/30/2014  . Sun-damaged skin 02/01/2014  . Anxiety and depression 02/01/2014  . Tobacco abuse disorder 02/01/2014  . Medicare annual wellness visit, subsequent 02/01/2014  . Pedal edema 12/10/2013  . Overactive bladder 12/10/2013  . Abdominal aortic aneurysm ([place]) 10/01/2013  . Arthritis of right knee 10/01/2013  . Benign paroxysmal positional vertigo 10/01/2013  . Neck pain 10/01/2013  . Esophageal reflux 10/01/2013  . Thrombocytopenia ([place]) 03/17/2012  . 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   . Hyperlipidemia, mixed   . Breast cancer [facility])     Past Surgical History:  Procedure Laterality Date  . APPENDECTOMY  2007  . BREAST SURGERY  2009 right  . CATARACT EXTRACTION     x 2  . ESOPHAGOGASTRODUODENOSCOPY (EGD) WITH PROPOFOL N/A 08/14/2016   Procedure: ESOPHAGOGASTRODUODENOSCOPY (EGD) WITH PROPOFOL;  Surgeon: Mauri Pole, MD;  Location[facility];  Service: Endoscopy;  Laterality: N/A;  . [place]  . KNEE SURGERY    . MANDIBLE FRACTURE SURGERY    . MASTECTOMY    . PILONIDAL CYST EXCISION    . TONSILLECTOMY      OB History  No data available       Home Medications    Prior to Admission medications   Medication Sig Start Date End Date Taking? Authorizing Provider  albuterol (ACCUNEB) 1.25 MG/3ML nebulizer solution Take 3 mLs (1.25 mg total) by nebulization every 6 (six) hours as needed for wheezing. 12/10/16   Rigoberto Noel, MD  albuterol (PROVENTIL HFA;VENTOLIN HFA) 108 (90 Base) MCG/ACT inhaler Inhale 2 puffs into the lungs  every 6 (six) hours as needed for wheezing or shortness of breath. Only dispense Ventolin 02/25/16   Mosie Lukes, MD  BD PEN NEEDLE NANO U/F 32G X 4 MM MISC USE AS DIRECTED WITH LEVEMIR FLEXPEN 08/17/16   Mosie Lukes, MD  Blood Glucose Monitoring Suppl (ONE TOUCH ULTRA 2) w/Device KIT Use as directed once daily to check blood sugar.  DX E11.9 09/14/16   Mosie Lukes, MD  Cholecalciferol (VITAMIN D3) 2000 units TABS Take 2,000 Units by mouth every morning.    [provider]  fluticasone (FLONASE) 50 MCG/ACT nasal spray Place 2 sprays into both nostrils daily. Patient taking differently: Place 2 sprays into both nostrils daily as needed for allergies.  11/10/16   Mosie Lukes, MD  furosemide (LASIX) 20 MG tablet TAKE 1 TABLET BY MOUTH TWICE DAILY Patient taking differently: TAKE 1 TABLET BY MOUTH TWICE DAILY PRN FOR SWELLING 08/14/16   Nandigam, Venia Minks, MD  glucose blood (ONE TOUCH ULTRA TEST) test strip CHECK BLOOD SUGAR TWICE DAILY AS DIRECTED 02/09/17   Mosie Lukes, MD  lactulose (CHRONULAC) 10 GM/15ML solution TAKE 45 MLS BY MOUTH TWICE DAILY AS NEEDED FOR MILD CONSTIPATION. 03/02/16   Mosie Lukes, MD  LEVEMIR FLEXTOUCH 100 UNIT/ML Pen INJECT 14 UNITS UNDER THE SKIN EVERY DAY AT 10PM 12/25/16   Mosie Lukes, MD  mupirocin ointment (BACTROBAN) 2 % Place on application over lesion on leg twice daily. 01/08/17   Shelda Pal, DO  omeprazole (PRILOSEC) 20 MG capsule TAKE 1 CAPSULE(20 MG) BY MOUTH DAILY 01/26/17   Mosie Lukes, MD  Polyvinyl Alcohol (LUBRICANT DROPS OP) Apply 1 drop to eye daily as needed (dry eyes).    [provider]  rifaximin (XIFAXAN) 550 MG TABS tablet Take 1 tablet (550 mg total) by mouth 2 (two) times daily. 09/04/16   Esterwood, Amy S, PA-C  spironolactone (ALDACTONE) 50 MG tablet TAKE 2 TABLETS(100 MG) BY MOUTH DAILY 01/26/17   Nandigam, Venia Minks, MD  Tiotropium Bromide-Olodaterol (STIOLTO RESPIMAT) 2.5-2.5 MCG/ACT AERS Inhale 2  puffs into the lungs daily. 02/15/17   Rigoberto Noel, MD    Family History Family History  Problem Relation Age of Onset  . Heart failure Father   . COPD Father   . Arthritis Father 29  . Stroke Mother   . Arthritis Mother 45  . Hyperlipidemia Mother   . Hypertension Mother   . Diabetes Mother   . Diabetes Sister   . Breast cancer Unknown   . Breast cancer Maternal Aunt   . Asthma Maternal Aunt   . Birth defects Maternal Aunt   . Alcohol abuse Maternal Uncle   . Breast cancer Maternal Aunt     Social History Social History  Substance Use Topics  . Smoking status: Current Some Day Smoker    Packs/day: 0.25    Years: 58.00    Types: Cigarettes    Start date: 07/27/1964  . Smokeless tobacco: Never Used     Comment: 1 pack per week, tobacco  infor given 12/30/15  . Alcohol use No     Allergies   Citalopram; Erythromycin; Glimepiride; Prednisone; and Versed [midazolam]   Review of Systems Review of Systems  Constitutional: Negative for chills, diaphoresis, fatigue and fever.  Genitourinary: Positive for urgency. Negative for dysuria.  Psychiatric/Behavioral: Positive for confusion. Negative for hallucinations and self-injury.  All other systems are reviewed and are negative for acute change except as noted in the HPI    Physical Exam Updated Vital Signs BP (!) 119/49 (BP Location: Right Arm)   Pulse 83   Temp 97.8 F (36.6 C) (Oral)   Resp 18   Ht 5' 4.5" (1.638 m)   Wt 67.1 kg (148 lb)   SpO2 100%   BMI 25.01 kg/m   Physical Exam  Constitutional: She is oriented to person, place, and time. She appears well-developed and well-nourished. No distress.  HENT:  Head: Normocephalic and atraumatic.  Nose: Nose normal.  Eyes: Pupils are equal, round, and reactive to light. Conjunctivae and EOM are normal. Right eye exhibits no discharge. Left eye exhibits no discharge. No scleral icterus.  Neck: Normal range of motion. Neck supple.  Cardiovascular: Normal rate and  regular rhythm.  Exam reveals no gallop and no friction rub.   No murmur heard. Pulmonary/Chest: Effort normal and breath sounds normal. No stridor. No respiratory distress. She has no rales.  Abdominal: Soft. She exhibits no distension. There is no tenderness.  Musculoskeletal: She exhibits no edema or tenderness.  Neurological: She is alert and oriented to person, place, and time.  Skin: Skin is warm and dry. No rash noted. She is not diaphoretic. No erythema.  Psychiatric: She has a normal mood and affect.  Vitals reviewed.    ED Treatments / Results  Labs (all labs ordered are listed, but only abnormal results are displayed) Labs Reviewed  COMPREHENSIVE METABOLIC PANEL - Abnormal; Notable for the following:       Result Value   Glucose, Bld 149 (*)    Total Protein 6.0 (*)    Albumin 3.0 (*)    AST 54 (*)    Alkaline Phosphatase 153 (*)    Total Bilirubin 3.7 (*)    GFR calc non Af Amer 56 (*)    All other components within normal limits  CBC WITH DIFFERENTIAL/PLATELET - Abnormal; Notable for the following:    RDW 15.6 (*)    Platelets 111 (*)    All other components within normal limits  URINALYSIS, COMPLETE (UACMP) WITH MICROSCOPIC - Abnormal; Notable for the following:    Color, Urine AMBER (*)    Bilirubin Urine SMALL (*)    Ketones, ur 15 (*)    Squamous Epithelial / LPF 0-5 (*)    Bacteria, UA FEW (*)    All other components within normal limits  AMMONIA - Abnormal; Notable for the following:    Ammonia 65 (*)    All other components within normal limits  CBG MONITORING, ED - Abnormal; Notable for the following:    Glucose-Capillary 144 (*)    All other components within normal limits    EKG  EKG Interpretation None       Radiology No results found.  Procedures Procedures (including critical care time)  Medications Ordered in ED Medications  0.9 %  sodium chloride infusion ( Intravenous New Bag/Given 03/11/17 1223)  sodium chloride 0.9 % bolus 500  mL (0 mLs Intravenous Stopped 03/11/17 1223)     Initial Impression / Assessment and Plan / ED  Course  I have reviewed the triage vital signs and the nursing notes.  Pertinent labs & imaging results that were available during my care of the patient were reviewed by me and considered in my medical decision making (see chart for details).     Complete resolution of patient's symptomatology, per the patient and friend. This is likely secondary to hepatic encephalopathy from elevated ammonia. Low suspicion for CVA. No electrolyte derangements or evidence of infectious process.  Patient requests discharge given her complete resolution of her symptoms. The friend stated that she would be able to check on the patient frequently every day and ensure that if she noted any additional confusion that she return to the emergency department for further evaluation. Appropriate and patient friend were given strict return precautions. She was instructed to continue using daily lactulose, and to follow up closely with her gastroenterologist or primary care provider.    Final Clinical Impressions(s) / ED Diagnoses   Final diagnoses:  Confusion   Disposition: Discharge  Condition: Good  I have discussed the results, Dx and Tx plan with the patient who expressed understanding and agree(s) with the plan. Discharge instructions discussed at great length. The patient was given strict return precautions who verbalized understanding of the instructions. No further questions at time of discharge.    New Prescriptions   No medications on file    Follow Up: Mosie Lukes, [place] [address] [phone]  Schedule an appointment as soon as possible for a visit  As needed      Card[place], Grayce Sessions, MD 03/11/17 1325

## 2017-03-11 NOTE — Discharge Instructions (Signed)
Take a dose of lactulose as soon as you get home. She isn't sure you take at least one dose of lactulose per day and follow-up with your gastroenterologist or primary care provider.

## 2017-03-11 NOTE — ED Notes (Signed)
CBG 144. Annie Paras RN notified.

## 2017-03-11 NOTE — ED Triage Notes (Signed)
Per [facility]: pt called EMS d/t memory trouble since yesterday. Pt currently receiving breathing tx by EMS d/t wheezing (pt has hx of COPD). Pt denies pain, dizziness, numbness/tingling, vision changes. Pt has tremors and states this is her norm. Pt's speech is slurred.

## 2017-03-12 ENCOUNTER — Other Ambulatory Visit: Payer: Self-pay | Admitting: Gastroenterology

## 2017-03-15 ENCOUNTER — Encounter: Payer: Self-pay | Admitting: Pulmonary Disease

## 2017-03-15 ENCOUNTER — Other Ambulatory Visit: Payer: Self-pay

## 2017-03-15 MED ORDER — TIOTROPIUM BROMIDE-OLODATEROL 2.5-2.5 MCG/ACT IN AERS: 2.0000 | INHALATION_SPRAY | Freq: Every day | RESPIRATORY_TRACT | 3 refills | Status: DC

## 2017-03-15 NOTE — Patient Outreach (Signed)
[facility]) Care Management  03/15/2017  KINYA MEINE [birth year] [phone]   Telephone call to patient for telephone assessment follow up. Unable to reach patient. HIPAA compliant voice message left with call back phone number.  PLAN:  RNCM will attempt 2nd telephone call to patient within 2 weeks.   [name] RN,BSN,CCM [facility] Telephonic  [phone]

## 2017-03-15 NOTE — Telephone Encounter (Signed)
RX has been printed and sent to [facility] for patient.

## 2017-03-16 ENCOUNTER — Telehealth: Payer: Self-pay | Admitting: Pulmonary Disease

## 2017-03-16 ENCOUNTER — Other Ambulatory Visit: Payer: Self-pay

## 2017-03-16 MED ORDER — TIOTROPIUM BROMIDE-OLODATEROL 2.5-2.5 MCG/ACT IN AERS: 2.0000 | INHALATION_SPRAY | Freq: Every day | RESPIRATORY_TRACT | 0 refills | Status: AC

## 2017-03-16 NOTE — Telephone Encounter (Signed)
Spoke with a rep at [facility]. They stated that they needed a RX with RA's signature. Advised that one had already sent in (yesterday). Gave a verbal over the phone to state it was ok to process patient through program. Nothing else was needed at time of call.

## 2017-03-18 ENCOUNTER — Other Ambulatory Visit: Payer: Self-pay | Admitting: Family Medicine

## 2017-03-19 NOTE — Telephone Encounter (Signed)
Faxed 30d Lactulose till OV/thx dmf

## 2017-03-21 ENCOUNTER — Other Ambulatory Visit: Payer: Self-pay | Admitting: Pulmonary Disease

## 2017-03-22 DIAGNOSIS — N3944 Nocturnal enuresis: Secondary | ICD-10-CM | POA: Diagnosis not present

## 2017-03-22 DIAGNOSIS — R350 Frequency of micturition: Secondary | ICD-10-CM | POA: Diagnosis not present

## 2017-03-22 DIAGNOSIS — N3942 Incontinence without sensory awareness: Secondary | ICD-10-CM | POA: Diagnosis not present

## 2017-03-22 DIAGNOSIS — R351 Nocturia: Secondary | ICD-10-CM | POA: Diagnosis not present

## 2017-03-23 ENCOUNTER — Ambulatory Visit: Payer: Self-pay

## 2017-03-24 ENCOUNTER — Other Ambulatory Visit: Payer: Self-pay

## 2017-03-24 NOTE — Patient Outreach (Signed)
[name] Care Management  03/24/2017  Ashley Savage 1940/11/25 [id number]    Nurse call line and Monthly telephone assessment follow up:  Telephone call to patient. HIPAA verified with patient. Patient states she is doing ok today. Patient states she saw the urologist on 03/22/17 and had a procedure done in the office for the urinary incontinence. Patient states she was given an antibiotic Cipro to take. Patient states she stopped taking the medication because it was causing some stomach upset and dizziness. Patient states she also read that you shouldn't take it if you have liver problems. Patient states she has not notified her doctors office that she discontinued the medication. RNCM advised patient to notify the office that she stopped taking her medication. Patient states she goes back to her doctor in 1 week on 03/30/17. Patient states she called the nurse call line because she had question concerning the antibiotic.  Patient states she had a confusion spell a few weeks ago related to her liver condition. Patient states she went to the med center to be checked out. Patient states she goes to the med center when the confusion happens because she want to make sure she is not having a urinary tract infection and its only related to her liver condition. Patient denies any new symptoms at this time related to her liver condition.  Patient was able to states 3 symptoms of NASH. Patient aware to call doctor for symptoms and call 911 for severe symptoms.  Patient states she continues to be active with the [employer] center.  Patient states she started a 6 week class called, Living a healthy life with chronic condition. Patient states she is on week 3 of her class.   Patient reports she saw her pulmonologist last in June for her COPD. Patient states she continues to use her control medication and uses her nebulizer as needed. Patient states she is still smoking. Patient states she  feels she is smoking less than she was.  States some days she is able to go without a cigarette and some days she smokes more. RNCM discussed smoking strategies with patient.  Advised patient of the importance to quit smoking due to her health conditions.  Patient verbally agreed to have follow up with [facility] for COPD education /management. Patient states she is still having pain in her knee. Patient states she saw her liver doctor and was told she can use over the counter pain medication such as ibuprofen sparingly for pain control.     ASSESSMENT: Patient self managing NASH condition. Knows when to call doctor and when to implement action plan.  Patient agreeable to COPD education and management follow up.  PLAN; RNCM will follow up with patient in the month of September 2018 RNCM will send patient COPD education material.  [name] RN,BSN,CCM [facility] Telephonic  [phone]

## 2017-03-30 ENCOUNTER — Ambulatory Visit (INDEPENDENT_AMBULATORY_CARE_PROVIDER_SITE_OTHER): Payer: PPO | Admitting: Family Medicine

## 2017-03-30 ENCOUNTER — Encounter: Payer: Self-pay | Admitting: Family Medicine

## 2017-03-30 VITALS — BP 116/58 | HR 75 | Temp 98.1°F | Ht 66.0 in | Wt 154.8 lb

## 2017-03-30 DIAGNOSIS — N3281 Overactive bladder: Secondary | ICD-10-CM | POA: Diagnosis not present

## 2017-03-30 DIAGNOSIS — F419 Anxiety disorder, unspecified: Secondary | ICD-10-CM

## 2017-03-30 DIAGNOSIS — K7290 Hepatic failure, unspecified without coma: Secondary | ICD-10-CM

## 2017-03-30 DIAGNOSIS — E1142 Type 2 diabetes mellitus with diabetic polyneuropathy: Secondary | ICD-10-CM

## 2017-03-30 DIAGNOSIS — F32A Depression, unspecified: Secondary | ICD-10-CM

## 2017-03-30 DIAGNOSIS — R197 Diarrhea, unspecified: Secondary | ICD-10-CM | POA: Diagnosis not present

## 2017-03-30 DIAGNOSIS — Z794 Long term (current) use of insulin: Secondary | ICD-10-CM

## 2017-03-30 DIAGNOSIS — K7581 Nonalcoholic steatohepatitis (NASH): Secondary | ICD-10-CM

## 2017-03-30 DIAGNOSIS — F329 Major depressive disorder, single episode, unspecified: Secondary | ICD-10-CM

## 2017-03-30 DIAGNOSIS — L309 Dermatitis, unspecified: Secondary | ICD-10-CM | POA: Diagnosis not present

## 2017-03-30 DIAGNOSIS — Z0000 Encounter for general adult medical examination without abnormal findings: Secondary | ICD-10-CM

## 2017-03-30 DIAGNOSIS — Z23 Encounter for immunization: Secondary | ICD-10-CM

## 2017-03-30 DIAGNOSIS — E782 Mixed hyperlipidemia: Secondary | ICD-10-CM

## 2017-03-30 DIAGNOSIS — Z720 Tobacco use: Secondary | ICD-10-CM | POA: Diagnosis not present

## 2017-03-30 DIAGNOSIS — M8580 Other specified disorders of bone density and structure, unspecified site: Secondary | ICD-10-CM | POA: Diagnosis not present

## 2017-03-30 DIAGNOSIS — K7682 Hepatic encephalopathy: Secondary | ICD-10-CM

## 2017-03-30 HISTORY — DX: Other specified disorders of bone density and structure, unspecified site: M85.80

## 2017-03-30 HISTORY — DX: Dermatitis, unspecified: L30.9

## 2017-03-30 MED ORDER — MOMETASONE FUROATE 50 MCG/ACT NA SUSP: 2.0000 | Freq: Every day | NASAL | 12 refills | Status: DC

## 2017-03-30 NOTE — Assessment & Plan Note (Signed)
Doing well and is minimizing simple carbs. Continue lactulose prn

## 2017-03-30 NOTE — Progress Notes (Signed)
Subjective:  I acted as a Education administrator for Dr. Charlett Blake. [name], [place]  [name], female    DOB: [birth year], 76 y.o.   MRN: [id number]  Chief Complaint  Patient presents with  . Annual Exam    Patient is here today for her CPE.     HPI  Patient is in today for an annual exam. She has no acute concerns. She feels well today. She has had some episodes of mental status changes and when she upped the lactulose and her diarrhea started but her mental status cleared. Last visit to ER was mid august. No recent febrile illness. She is eating well and staying as active as she can. She uses a cane to ambulate. She manages her ADLs well most days. When confusion starts she has very helpful neighbors who check in on her. Denies CP/palp/HA/congestion/fevers/GI or GU c/o. Taking meds as prescribed. She is following closely with pulmonology and they have just tried her on Tiotropium and Olodaterol inhaler 2 puffs a day with good results she is now waiting for refills thru the mail so her breathing is a little flared back on the Spiriva. She has had patent assistance agree to help with the new med for a year. Has seen dermatology for her dry skin and has responded well to tinactin for rash round mouth. Alternates head and shoulders with Nizoral shampoo which has helped the dandruff.    Patient Care Team: Mosie Lukes, MD as PCP - General (Family Medicine) Dene Gentry, MD as Consulting Physician (Sports Medicine) Mauri Pole, MD as Consulting Physician (Gastroenterology) Rigoberto Noel, MD as Consulting Physician (Pulmonary Disease) Parrett, Fonnie Mu, NP as Nurse Practitioner (Pulmonary Disease) Volanda Napoleon, MD as Consulting Physician (Oncology) Dannielle Karvonen, RN as [place] Management   Past Medical History:  Diagnosis Date  . Allergic state 11/10/2016  . Anxiety   . Arthritis of both knees 10/01/2013  . Benign paroxysmal positional vertigo 10/01/2013  .  Cancer [facility]) breast ca  right  . COPD (chronic obstructive pulmonary disease) ([place]) 10/01/2013  . Depression   . Diabetes mellitus type 2  . Emphysema   . Encephalopathy, hepatic ([place]) 06/07/2014  . Esophageal reflux 10/01/2013  . Fall 07/30/2016  . Hyperlipidemia   . Hyperlipidemia, mixed   . Increased ammonia level 11/25/2014  . NASH (nonalcoholic steatohepatitis) 08/26/2015  . Neck pain 10/01/2013  . Neuropathy    feet   . Overactive bladder 12/10/2013  . Panic attacks   . Pedal edema 12/10/2013  . Personal history of radiation therapy   . Preventative health care 03/08/2016  . Tobacco abuse disorder 02/01/2014    Past Surgical History:  Procedure Laterality Date  . APPENDECTOMY  2007  . BREAST SURGERY  2009 right  . CATARACT EXTRACTION     x 2  . ESOPHAGOGASTRODUODENOSCOPY (EGD) WITH PROPOFOL N/A 08/14/2016   Procedure: ESOPHAGOGASTRODUODENOSCOPY (EGD) WITH PROPOFOL;  Surgeon: Mauri Pole, MD;  Location: [facility] ENDOSCOPY;  Service: Endoscopy;  Laterality: N/A;  . [place]  . KNEE SURGERY    . MANDIBLE FRACTURE SURGERY    . MASTECTOMY    . PILONIDAL CYST EXCISION    . TONSILLECTOMY      Family History  Problem Relation Age of Onset  . Heart failure Father   . COPD Father   . Arthritis Father 8  . Stroke Mother   . Arthritis Mother 61  . Hyperlipidemia Mother   .  Hypertension Mother   . Diabetes Mother   . Diabetes Sister   . Breast cancer Unknown   . Breast cancer Maternal Aunt   . Asthma Maternal Aunt   . Birth defects Maternal Aunt   . Alcohol abuse Maternal Uncle   . Breast cancer Maternal Aunt     Social History   Social History  . Marital status: Single    Spouse name: N/A  . Number of children: 0  . Years of education: N/A   Occupational History  . retired Retired   Social History Main Topics  . Smoking status: Current Some Day Smoker    Packs/day: 0.25    Years: 58.00    Types: Cigarettes    Start date: 07/27/1964  . Smokeless  tobacco: Never Used     Comment: 1 pack per week, tobacco infor given 12/30/15  . Alcohol use No  . Drug use: No  . Sexual activity: No   Other Topics Concern  . Not on file   Social History Narrative   Lives alone, continues to smoke, no dietary restrictions    Outpatient Medications Prior to Visit  Medication Sig Dispense Refill  . albuterol (ACCUNEB) 1.25 MG/3ML nebulizer solution USE 1 VIAL VIA NEBULIZER EVERY 6 HOURS AS NEEDED FOR WHEEZING 75 mL 2  . albuterol (PROVENTIL HFA;VENTOLIN HFA) 108 (90 Base) MCG/ACT inhaler Inhale 2 puffs into the lungs every 6 (six) hours as needed for wheezing or shortness of breath. Only dispense Ventolin 1 Inhaler 3  . BD PEN NEEDLE NANO U/F 32G X 4 MM MISC USE AS DIRECTED WITH LEVEMIR FLEXPEN 100 each 0  . Blood Glucose Monitoring Suppl (ONE TOUCH ULTRA 2) w/Device KIT Use as directed once daily to check blood sugar.  DX E11.9 1 each 0  . Cholecalciferol (VITAMIN D3) 2000 units TABS Take 2,000 Units by mouth every morning.    . fluticasone (FLONASE) 50 MCG/ACT nasal spray Place 2 sprays into both nostrils daily. (Patient taking differently: Place 2 sprays into both nostrils daily as needed for allergies. ) 16 g 6  . furosemide (LASIX) 20 MG tablet TAKE 1 TABLET BY MOUTH TWICE DAILY (Patient taking differently: TAKE 1 TABLET BY MOUTH TWICE DAILY PRN FOR SWELLING) 60 tablet 0  . glucose blood (ONE TOUCH ULTRA TEST) test strip CHECK BLOOD SUGAR TWICE DAILY AS DIRECTED 100 each 11  . ibuprofen (ADVIL,MOTRIN) 200 MG tablet Take 200 mg by mouth every 6 (six) hours as needed for mild pain.    Marland Kitchen lactulose (CHRONULAC) 10 GM/15ML solution TAKE 45ML BY MOUTH TWICE DAILY AS NEEDED FOR MILD CONSTIPATION 1892 mL 0  . LEVEMIR FLEXTOUCH 100 UNIT/ML Pen INJECT 14 UNITS UNDER THE SKIN EVERY DAY AT 10PM 15 mL 0  . mupirocin ointment (BACTROBAN) 2 % Place on application over lesion on leg twice daily. 22 g 0  . omeprazole (PRILOSEC) 20 MG capsule TAKE 1 CAPSULE(20 MG) BY  MOUTH DAILY 90 capsule 0  . Polyvinyl Alcohol (LUBRICANT DROPS OP) Apply 1 drop to eye daily as needed (dry eyes).    . rifaximin (XIFAXAN) 550 MG TABS tablet Take 1 tablet (550 mg total) by mouth 2 (two) times daily. 60 tablet 11  . spironolactone (ALDACTONE) 50 MG tablet TAKE 2 TABLETS(100 MG) BY MOUTH DAILY 60 tablet 0  . Tiotropium Bromide-Olodaterol (STIOLTO RESPIMAT) 2.5-2.5 MCG/ACT AERS Inhale 2 puffs into the lungs daily. 3 Inhaler 3   No facility-administered medications prior to visit.     Allergies  Allergen Reactions  . Citalopram Palpitations    Irregular heart beat  . Erythromycin Other (See Comments)    Stomach cramps  . Glimepiride     Elevated ammonia levels  . Prednisone     Increased blood sugars too high  . Versed [Midazolam] Other (See Comments)    Patient stayed confusion stayed 4+days     Review of Systems  Constitutional: Negative for chills, fever and malaise/fatigue.  HENT: Negative for congestion and hearing loss.   Eyes: Negative for discharge.  Respiratory: Negative for cough, sputum production and shortness of breath.   Cardiovascular: Negative for chest pain, palpitations and leg swelling.  Gastrointestinal: Positive for diarrhea. Negative for abdominal pain, blood in stool, constipation, heartburn, nausea and vomiting.  Genitourinary: Negative for dysuria, frequency, hematuria and urgency.  Musculoskeletal: Negative for back pain, falls and myalgias.  Skin: Negative for rash.  Neurological: Negative for dizziness, sensory change, loss of consciousness, weakness and headaches.  Endo/Heme/Allergies: Negative for environmental allergies. Does not bruise/bleed easily.  Psychiatric/Behavioral: Negative for depression, hallucinations and suicidal ideas. The patient is nervous/anxious. The patient does not have insomnia.        Objective:    Physical Exam  Constitutional: She is oriented to person, place, and time. She appears well-developed and  well-nourished. No distress.  HENT:  Head: Normocephalic and atraumatic.  Eyes: Conjunctivae are normal.  Neck: Neck supple. No thyromegaly present.  Cardiovascular: Normal rate, regular rhythm and normal heart sounds.   No murmur heard. Pulmonary/Chest: Effort normal and breath sounds normal. No respiratory distress.  Abdominal: Soft. Bowel sounds are normal. She exhibits no distension and no mass. There is no tenderness.  Musculoskeletal: She exhibits no edema.  Lymphadenopathy:    She has no cervical adenopathy.  Neurological: She is alert and oriented to person, place, and time.  Skin: Skin is warm and dry.  Psychiatric: She has a normal mood and affect. Her behavior is normal.    BP (!) 116/58 (BP Location: Left Arm, Patient Position: Sitting, Cuff Size: Normal)   Pulse 75   Temp 98.1 F (36.7 C) (Oral)   Ht _0  (1.676 m)   Wt 154 lb 12.8 oz (70.2 kg)   SpO2 96%   BMI 24.99 kg/m  Wt Readings from Last 3 Encounters:  03/30/17 154 lb 12.8 oz (70.2 kg)  03/24/17 144 lb (65.3 kg)  03/11/17 148 lb (67.1 kg)   BP Readings from Last 3 Encounters:  03/30/17 (!) 116/58  03/11/17 128/61  02/19/17 (!) 114/48     Immunization History  Administered Date(s) Administered  . Influenza Split 04/09/2011, 03/27/2012, 03/27/2013  . Influenza,inj,Quad PF,6+ Mos 03/26/2015  . Influenza-Unspecified 04/24/2014, 04/23/2016  . Pneumococcal Conjugate-13 02/01/2014  . Pneumococcal Polysaccharide-23 04/09/2011  . Td 07/27/2010  . Tdap 05/08/2014    Health Maintenance  Topic Date Due  . FOOT EXAM  11/24/2016  . INFLUENZA VACCINE  02/24/2017  . URINE MICROALBUMIN  02/24/2017  . OPHTHALMOLOGY EXAM  05/06/2017  . HEMOGLOBIN A1C  08/12/2017  . MAMMOGRAM  01/24/2018  . COLONOSCOPY  07/27/2020  . TETANUS/TDAP  05/08/2024  . DEXA SCAN  Completed  . PNA vac Low Risk Adult  Completed    Lab Results  Component Value Date   WBC 7.0 03/11/2017   HGB 14.5 03/11/2017   HCT 41.8 03/11/2017    PLT 111 (L) 03/11/2017   GLUCOSE 149 (H) 03/11/2017   CHOL 160 02/09/2017   TRIG 78.0 02/09/2017   HDL 48.40 02/09/2017  LDLCALC 96 02/09/2017   ALT 35 03/11/2017   AST 54 (H) 03/11/2017   NA 138 03/11/2017   K 4.8 03/11/2017   CL 101 03/11/2017   CREATININE 0.98 03/11/2017   BUN 20 03/11/2017   CO2 27 03/11/2017   TSH 1.16 11/10/2016   INR 1.3 (H) 02/19/2017   HGBA1C 5.8 02/09/2017   MICROALBUR <0.7 02/25/2016    Lab Results  Component Value Date   TSH 1.16 11/10/2016   Lab Results  Component Value Date   WBC 7.0 03/11/2017   HGB 14.5 03/11/2017   HCT 41.8 03/11/2017   MCV 92.5 03/11/2017   PLT 111 (L) 03/11/2017   Lab Results  Component Value Date   NA 138 03/11/2017   K 4.8 03/11/2017   CHLORIDE 104 06/29/2016   CO2 27 03/11/2017   GLUCOSE 149 (H) 03/11/2017   BUN 20 03/11/2017   CREATININE 0.98 03/11/2017   BILITOT 3.7 (H) 03/11/2017   ALKPHOS 153 (H) 03/11/2017   AST 54 (H) 03/11/2017   ALT 35 03/11/2017   PROT 6.0 (L) 03/11/2017   ALBUMIN 3.0 (L) 03/11/2017   CALCIUM 9.3 03/11/2017   ANIONGAP 10 03/11/2017   EGFR 50 (L) 06/29/2016   GFR 56.32 (L) 02/19/2017   Lab Results  Component Value Date   CHOL 160 02/09/2017   Lab Results  Component Value Date   HDL 48.40 02/09/2017   Lab Results  Component Value Date   LDLCALC 96 02/09/2017   Lab Results  Component Value Date   TRIG 78.0 02/09/2017   Lab Results  Component Value Date   CHOLHDL 3 02/09/2017   Lab Results  Component Value Date   HGBA1C 5.8 02/09/2017         Assessment & Plan:   Problem List Items Addressed This Visit    Diabetes mellitus type 2, controlled ([place]) (Chronic)    hgba1c acceptable, minimize simple carbs. Increase exercise as tolerated.      Hyperlipidemia, mixed    Encouraged heart healthy diet, increase exercise, avoid trans fats, consider a krill oil cap daily      Anxiety and depression    Lives alone but has good neighbors who check on her and  they have a key to her house. He is a retired EMT with wivual impairment and that is helpful      RESOLVED: Tobacco abuse disorder    Encouraged complete cessation. Discussed need to quit as relates to risk of numerous cancers, cardiac and pulmonary disease as well as neurologic complications. Counseled for greater than 3 minutes      Encephalopathy, hepatic (HCC)    Has had a couple of episodes of increased confusion in the past few months. She has taken to upping her dose of Lactulose and then the diarrhea starts and her confusion improves. 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       Diarrhea    Episodes when she has to take extra lactulose but she manages well      NASH (nonalcoholic steatohepatitis)    Doing well and is minimizing simple carbs. Continue lactulose prn      Preventative health care    Patient encouraged to maintain heart healthy diet, regular exercise, adequate sleep. Consider daily probiotics. Take medications as prescribed. Labs reviewed. Given high dose flu shot today. She will go pharmacy to get flu shot.         I am  having Ms. Lupinacci maintain her albuterol, Vitamin D3, Polyvinyl Alcohol (LUBRICANT DROPS OP), furosemide, BD PEN NEEDLE NANO U/F, rifaximin, ONE TOUCH ULTRA 2, fluticasone, LEVEMIR FLEXTOUCH, mupirocin ointment, omeprazole, glucose blood, spironolactone, Tiotropium Bromide-Olodaterol, lactulose, albuterol, and ibuprofen.  No orders of the defined types were placed in this encounter.   CMA served as Education administrator during this visit. History, Physical and Plan performed by medical provider. Documentation and orders reviewed and attested to.  Penni Homans, MD

## 2017-03-30 NOTE — Assessment & Plan Note (Signed)
Has been seen by dermatology and they have added several OTC creams and Tinactin and that has been helpful. She has switched to mild soaps

## 2017-03-30 NOTE — Assessment & Plan Note (Addendum)
Patient encouraged to maintain heart healthy diet, regular exercise, adequate sleep. Consider daily probiotics. Take medications as prescribed. Labs reviewed. Given high dose flu shot today. She will go pharmacy to get flu shot.

## 2017-03-30 NOTE — Assessment & Plan Note (Signed)
Encouraged heart healthy diet, increase exercise, avoid trans fats, consider a krill oil cap daily 

## 2017-03-30 NOTE — Assessment & Plan Note (Signed)
Lives alone but has good neighbors who check on her and they have a key to her house. He is a retired EMT with wivual impairment and that is helpful

## 2017-03-30 NOTE — Assessment & Plan Note (Signed)
hgba1c acceptable, minimize simple carbs. Increase exercise as tolerated.  

## 2017-03-30 NOTE — Assessment & Plan Note (Signed)
Has been following with urology and she was recently started on Ciprofloxacin for a procedure but this made her confusion worse. Has an appointment soon for a cystoscopy. Has had some persistnet urinary incontinence. Has a follow up appointment

## 2017-03-30 NOTE — Assessment & Plan Note (Signed)
Has had a couple of episodes of increased confusion in the past few months. She has taken to upping her dose of Lactulose and then the diarrhea starts and her confusion improves. 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

## 2017-03-30 NOTE — Patient Instructions (Signed)
Go to pharmacy to get the new shingles shot Preventive Care 65 Years and Older, Female Preventive care refers to lifestyle choices and visits with your health care provider that can promote health and wellness. What does preventive care include?  A yearly physical exam. This is also called an annual well check.  Dental exams once or twice a year.  Routine eye exams. Ask your health care provider how often you should have your eyes checked.  Personal lifestyle choices, including: ? Daily care of your teeth and gums. ? Regular physical activity. ? Eating a healthy diet. ? Avoiding tobacco and drug use. ? Limiting alcohol use. ? Practicing safe sex. ? Taking low-dose aspirin every day. ? Taking vitamin and mineral supplements as recommended by your health care provider. What happens during an annual well check? The services and screenings done by your health care provider during your annual well check will depend on your age, overall health, lifestyle risk factors, and family history of disease. Counseling Your health care provider may ask you questions about your:  Alcohol use.  Tobacco use.  Drug use.  Emotional well-being.  Home and relationship well-being.  Sexual activity.  Eating habits.  History of falls.  Memory and ability to understand (cognition).  Work and work Statistician.  Reproductive health.  Screening You may have the following tests or measurements:  Height, weight, and BMI.  Blood pressure.  Lipid and cholesterol levels. These may be checked every 5 years, or more frequently if you are over 20 years old.  Skin check.  Lung cancer screening. You may have this screening every year starting at age 78 if you have a 30-pack-year history of smoking and currently smoke or have quit within the past 15 years.  Fecal occult blood test (FOBT) of the stool. You may have this test every year starting at age 74.  Flexible sigmoidoscopy or colonoscopy.  You may have a sigmoidoscopy every 5 years or a colonoscopy every 10 years starting at age 89.  Hepatitis C blood test.  Hepatitis B blood test.  Sexually transmitted disease (STD) testing.  Diabetes screening. This is done by checking your blood sugar (glucose) after you have not eaten for a while (fasting). You may have this done every 1-3 years.  Bone density scan. This is done to screen for osteoporosis. You may have this done starting at age 88.  Mammogram. This may be done every 1-2 years. Talk to your health care provider about how often you should have regular mammograms.  Talk with your health care provider about your test results, treatment options, and if necessary, the need for more tests. Vaccines Your health care provider may recommend certain vaccines, such as:  Influenza vaccine. This is recommended every year.  Tetanus, diphtheria, and acellular pertussis (Tdap, Td) vaccine. You may need a Td booster every 10 years.  Varicella vaccine. You may need this if you have not been vaccinated.  Zoster vaccine. You may need this after age 21.  Measles, mumps, and rubella (MMR) vaccine. You may need at least one dose of MMR if you were born in 1957 or later. You may also need a second dose.  Pneumococcal 13-valent conjugate (PCV13) vaccine. One dose is recommended after age 11.  Pneumococcal polysaccharide (PPSV23) vaccine. One dose is recommended after age 75.  Meningococcal vaccine. You may need this if you have certain conditions.  Hepatitis A vaccine. You may need this if you have certain conditions or if you travel or work  in places where you may be exposed to hepatitis A.  Hepatitis B vaccine. You may need this if you have certain conditions or if you travel or work in places where you may be exposed to hepatitis B.  Haemophilus influenzae type b (Hib) vaccine. You may need this if you have certain conditions.  Talk to your health care provider about which  screenings and vaccines you need and how often you need them. This information is not intended to replace advice given to you by your health care provider. Make sure you discuss any questions you have with your health care provider. Document Released: 08/09/2015 Document Revised: 04/01/2016 Document Reviewed: 05/14/2015 Elsevier Interactive Patient Education  2017 [employer].

## 2017-03-30 NOTE — Assessment & Plan Note (Signed)
Episodes when she has to take extra lactulose but she manages well

## 2017-03-30 NOTE — Assessment & Plan Note (Signed)
Encouraged to get adequate exercise, calcium and vitamin d intake 

## 2017-03-30 NOTE — Assessment & Plan Note (Signed)
Encouraged complete cessation. Discussed need to quit as relates to risk of numerous cancers, cardiac and pulmonary disease as well as neurologic complications. Counseled for greater than 3 minutes 

## 2017-03-31 DIAGNOSIS — R351 Nocturia: Secondary | ICD-10-CM | POA: Diagnosis not present

## 2017-03-31 DIAGNOSIS — N3942 Incontinence without sensory awareness: Secondary | ICD-10-CM | POA: Diagnosis not present

## 2017-03-31 DIAGNOSIS — R350 Frequency of micturition: Secondary | ICD-10-CM | POA: Diagnosis not present

## 2017-04-07 ENCOUNTER — Other Ambulatory Visit: Payer: Self-pay | Admitting: Family Medicine

## 2017-04-08 NOTE — Telephone Encounter (Signed)
Faxed 63mL of Levemir/due for OV in Dec/thx dmf

## 2017-04-13 ENCOUNTER — Encounter: Payer: Self-pay | Admitting: Family Medicine

## 2017-04-13 ENCOUNTER — Ambulatory Visit (INDEPENDENT_AMBULATORY_CARE_PROVIDER_SITE_OTHER): Payer: PPO | Admitting: Family Medicine

## 2017-04-13 VITALS — BP 118/56 | HR 86 | Temp 98.1°F | Resp 18 | Wt 150.8 lb

## 2017-04-13 DIAGNOSIS — E1142 Type 2 diabetes mellitus with diabetic polyneuropathy: Secondary | ICD-10-CM

## 2017-04-13 DIAGNOSIS — K7581 Nonalcoholic steatohepatitis (NASH): Secondary | ICD-10-CM

## 2017-04-13 DIAGNOSIS — Z794 Long term (current) use of insulin: Secondary | ICD-10-CM

## 2017-04-13 DIAGNOSIS — E1165 Type 2 diabetes mellitus with hyperglycemia: Secondary | ICD-10-CM

## 2017-04-13 DIAGNOSIS — R350 Frequency of micturition: Secondary | ICD-10-CM | POA: Diagnosis not present

## 2017-04-13 DIAGNOSIS — Z720 Tobacco use: Secondary | ICD-10-CM | POA: Diagnosis not present

## 2017-04-13 DIAGNOSIS — J449 Chronic obstructive pulmonary disease, unspecified: Secondary | ICD-10-CM | POA: Diagnosis not present

## 2017-04-13 DIAGNOSIS — E782 Mixed hyperlipidemia: Secondary | ICD-10-CM

## 2017-04-13 HISTORY — DX: Frequency of micturition: R35.0

## 2017-04-13 MED ORDER — AMOXICILLIN-POT CLAVULANATE 875-125 MG PO TABS: 1.0000 | ORAL_TABLET | Freq: Two times a day (BID) | ORAL | 0 refills | Status: DC

## 2017-04-13 NOTE — Progress Notes (Signed)
Subjective:  I acted as a Education administrator for Dr. Charlett Blake. [name], [place]  [name], female    DOB: 01-Nov-1940, 76 y.o.   MRN: [id number]  No chief complaint on file.   HPI  Patient is in today for a possible UTI. She is noting dysuria, urinary frequency and urgency. No fevers or chills. She is struggling with some malaise and myalgias. She continues to struggle with cough and congestion. Unfortunately she continues to smoke. Denies CP/palp/SOB/HA/fevers/GI or c/o. Taking meds as prescribed  Patient Care Team: Mosie Lukes, MD as PCP - General (Family Medicine) Dene Gentry, MD as Consulting Physician (Sports Medicine) Mauri Pole, MD as Consulting Physician (Gastroenterology) Rigoberto Noel, MD as Consulting Physician (Pulmonary Disease) Parrett, Fonnie Mu, NP as Nurse Practitioner (Pulmonary Disease) Volanda Napoleon, MD as Consulting Physician (Oncology) Dannielle Karvonen, RN as [place] [facility]   Past Medical History:  Diagnosis Date  . Allergic state 11/10/2016  . Anxiety   . Arthritis of both knees 10/01/2013  . Benign paroxysmal positional vertigo 10/01/2013  . Cancer [facility]) breast ca  right  . COPD (chronic obstructive pulmonary disease) ([place]) 10/01/2013  . Depression   . Dermatitis 03/30/2017  . Diabetes mellitus type 2  . Emphysema   . Encephalopathy, hepatic ([place]) 06/07/2014  . Esophageal reflux 10/01/2013  . Fall 07/30/2016  . Hyperlipidemia   . Hyperlipidemia, mixed   . Increased ammonia level 11/25/2014  . NASH (nonalcoholic steatohepatitis) 08/26/2015  . Neck pain 10/01/2013  . Neuropathy    feet   . Osteopenia 03/30/2017  . Overactive bladder 12/10/2013  . Panic attacks   . Pedal edema 12/10/2013  . Personal history of radiation therapy   . Preventative health care 03/08/2016  . Tobacco abuse disorder 02/01/2014    Past Surgical History:  Procedure Laterality Date  . APPENDECTOMY  2007  . BREAST SURGERY  2009 right  . CATARACT  EXTRACTION     x 2  . ESOPHAGOGASTRODUODENOSCOPY (EGD) WITH PROPOFOL N/A 08/14/2016   Procedure: ESOPHAGOGASTRODUODENOSCOPY (EGD) WITH PROPOFOL;  Surgeon: Mauri Pole, MD;  Location: [facility];  Service: Endoscopy;  Laterality: N/A;  . [place]  . KNEE SURGERY    . MANDIBLE FRACTURE SURGERY    . MASTECTOMY    . PILONIDAL CYST EXCISION    . TONSILLECTOMY      Family History  Problem Relation Age of Onset  . Heart failure Father   . COPD Father   . Arthritis Father 35  . Stroke Mother   . Arthritis Mother 3  . Hyperlipidemia Mother   . Hypertension Mother   . Diabetes Mother   . Diabetes Sister   . Breast cancer Unknown   . Breast cancer Maternal Aunt   . Asthma Maternal Aunt   . Birth defects Maternal Aunt   . Alcohol abuse Maternal Uncle   . Breast cancer Maternal Aunt     Social History   Social History  . Marital status: Single    Spouse name: N/A  . Number of children: 0  . Years of education: N/A   Occupational History  . retired Retired   Social History Main Topics  . Smoking status: Current Some Day Smoker    Packs/day: 0.25    Years: 58.00    Types: Cigarettes    Start date: 07/27/1964  . Smokeless tobacco: Never Used     Comment: 1 pack per week, tobacco infor  given 12/30/15  . Alcohol use No  . Drug use: No  . Sexual activity: No   Other Topics Concern  . Not on file   Social History Narrative   Lives alone, continues to smoke, no dietary restrictions    Outpatient Medications Prior to Visit  Medication Sig Dispense Refill  . albuterol (ACCUNEB) 1.25 MG/3ML nebulizer solution USE 1 VIAL VIA NEBULIZER EVERY 6 HOURS AS NEEDED FOR WHEEZING 75 mL 2  . albuterol (PROVENTIL HFA;VENTOLIN HFA) 108 (90 Base) MCG/ACT inhaler Inhale 2 puffs into the lungs every 6 (six) hours as needed for wheezing or shortness of breath. Only dispense Ventolin 1 Inhaler 3  . BD PEN NEEDLE NANO U/F 32G X 4 MM MISC USE AS DIRECTED WITH LEVEMIR  FLEXPEN 100 each 0  . Blood Glucose Monitoring Suppl (ONE TOUCH ULTRA 2) w/Device KIT Use as directed once daily to check blood sugar.  DX E11.9 1 each 0  . Cholecalciferol (VITAMIN D3) 2000 units TABS Take 2,000 Units by mouth every morning.    Marland Kitchen glucose blood (ONE TOUCH ULTRA TEST) test strip CHECK BLOOD SUGAR TWICE DAILY AS DIRECTED 100 each 11  . ibuprofen (ADVIL,MOTRIN) 200 MG tablet Take 200 mg by mouth every 6 (six) hours as needed for mild pain.    Marland Kitchen lactulose (CHRONULAC) 10 GM/15ML solution TAKE 45ML BY MOUTH TWICE DAILY AS NEEDED FOR MILD CONSTIPATION 1892 mL 0  . LEVEMIR FLEXTOUCH 100 UNIT/ML Pen INJECT 14 UNITS UNDER THE SKIN EVERY DAY AT 10PM 15 mL 0  . mometasone (NASONEX) 50 MCG/ACT nasal spray Place 2 sprays into the nose daily. 17 g 12  . mupirocin ointment (BACTROBAN) 2 % Place on application over lesion on leg twice daily. 22 g 0  . omeprazole (PRILOSEC) 20 MG capsule TAKE 1 CAPSULE(20 MG) BY MOUTH DAILY 90 capsule 0  . Polyvinyl Alcohol (LUBRICANT DROPS OP) Apply 1 drop to eye daily as needed (dry eyes).    . rifaximin (XIFAXAN) 550 MG TABS tablet Take 1 tablet (550 mg total) by mouth 2 (two) times daily. 60 tablet 11  . spironolactone (ALDACTONE) 50 MG tablet TAKE 2 TABLETS(100 MG) BY MOUTH DAILY 60 tablet 0  . Tiotropium Bromide-Olodaterol (STIOLTO RESPIMAT) 2.5-2.5 MCG/ACT AERS Inhale 2 puffs into the lungs daily. 3 Inhaler 3  . furosemide (LASIX) 20 MG tablet TAKE 1 TABLET BY MOUTH TWICE DAILY (Patient taking differently: TAKE 1 TABLET BY MOUTH TWICE DAILY PRN FOR SWELLING) 60 tablet 0   No facility-administered medications prior to visit.     Allergies  Allergen Reactions  . Citalopram Palpitations    Irregular heart beat  . Ciprofloxacin     Mental status change  . Erythromycin Other (See Comments)    Stomach cramps  . Glimepiride     Elevated ammonia levels  . Prednisone     Increased blood sugars too high  . Versed [Midazolam] Other (See Comments)     Patient stayed confusion stayed 4+days     Review of Systems  Constitutional: Positive for malaise/fatigue. Negative for chills and fever.  HENT: Positive for congestion. Negative for hearing loss.   Eyes: Negative for discharge.  Respiratory: Positive for cough. Negative for sputum production and shortness of breath.   Cardiovascular: Negative for chest pain, palpitations and leg swelling.  Gastrointestinal: Negative for abdominal pain, blood in stool, constipation, diarrhea, heartburn, nausea and vomiting.  Genitourinary: Positive for dysuria, frequency and urgency. Negative for flank pain and hematuria.  Musculoskeletal: Negative for back  pain, falls and myalgias.  Skin: Negative for rash.  Neurological: Negative for dizziness, sensory change, loss of consciousness, weakness and headaches.  Endo/Heme/Allergies: Negative for environmental allergies. Does not bruise/bleed easily.  Psychiatric/Behavioral: Negative for depression and suicidal ideas. The patient is not nervous/anxious and does not have insomnia.        Objective:    Physical Exam  Constitutional:  coughing  Abdominal: Soft. Bowel sounds are normal. She exhibits no distension and no mass. There is tenderness. There is no rebound and no guarding.    BP (!) 118/56 (BP Location: Left Arm, Patient Position: Sitting, Cuff Size: Normal)   Pulse 86   Temp 98.1 F (36.7 C) (Oral)   Resp 18   Wt 150 lb 12.8 oz (68.4 kg)   SpO2 93%   BMI 24.34 kg/m  Wt Readings from Last 3 Encounters:  04/13/17 150 lb 12.8 oz (68.4 kg)  03/30/17 154 lb 12.8 oz (70.2 kg)  03/24/17 144 lb (65.3 kg)   BP Readings from Last 3 Encounters:  04/13/17 (!) 118/56  03/30/17 (!) 116/58  03/11/17 128/61     Immunization History  Administered Date(s) Administered  . Influenza Split 04/09/2011, 03/27/2012, 03/27/2013  . Influenza, High Dose Seasonal PF 03/30/2017  . Influenza,inj,Quad PF,6+ Mos 03/26/2015  . Influenza-Unspecified  04/24/2014, 04/23/2016  . Pneumococcal Conjugate-13 02/01/2014  . Pneumococcal Polysaccharide-23 04/09/2011  . Td 07/27/2010  . Tdap 05/08/2014    Health Maintenance  Topic Date Due  . FOOT EXAM  11/24/2016  . URINE MICROALBUMIN  02/24/2017  . OPHTHALMOLOGY EXAM  05/06/2017  . HEMOGLOBIN A1C  08/12/2017  . MAMMOGRAM  01/24/2018  . COLONOSCOPY  07/27/2020  . TETANUS/TDAP  05/08/2024  . INFLUENZA VACCINE  Completed  . DEXA SCAN  Completed  . PNA vac Low Risk Adult  Completed    Lab Results  Component Value Date   WBC 6.4 04/13/2017   HGB 14.1 04/13/2017   HCT 43.6 04/13/2017   PLT 132.0 (L) 04/13/2017   GLUCOSE 134 (H) 04/13/2017   CHOL 160 02/09/2017   TRIG 78.0 02/09/2017   HDL 48.40 02/09/2017   LDLCALC 96 02/09/2017   ALT 26 04/13/2017   AST 40 (H) 04/13/2017   NA 137 04/13/2017   K 4.7 04/13/2017   CL 104 04/13/2017   CREATININE 0.96 04/13/2017   BUN 20 04/13/2017   CO2 30 04/13/2017   TSH 1.16 11/10/2016   INR 1.3 (H) 02/19/2017   HGBA1C 5.8 02/09/2017   MICROALBUR <0.7 02/25/2016    Lab Results  Component Value Date   TSH 1.16 11/10/2016   Lab Results  Component Value Date   WBC 6.4 04/13/2017   HGB 14.1 04/13/2017   HCT 43.6 04/13/2017   MCV 99.4 04/13/2017   PLT 132.0 (L) 04/13/2017   Lab Results  Component Value Date   NA 137 04/13/2017   K 4.7 04/13/2017   CHLORIDE 104 06/29/2016   CO2 30 04/13/2017   GLUCOSE 134 (H) 04/13/2017   BUN 20 04/13/2017   CREATININE 0.96 04/13/2017   BILITOT 2.8 (H) 04/13/2017   ALKPHOS 120 (H) 04/13/2017   AST 40 (H) 04/13/2017   ALT 26 04/13/2017   PROT 5.4 (L) 04/13/2017   ALBUMIN 2.6 (L) 04/13/2017   CALCIUM 9.1 04/13/2017   ANIONGAP 10 03/11/2017   EGFR 50 (L) 06/29/2016   GFR 60.38 04/13/2017   Lab Results  Component Value Date   CHOL 160 02/09/2017   Lab Results  Component Value Date  HDL 48.40 02/09/2017   Lab Results  Component Value Date   LDLCALC 96 02/09/2017   Lab Results    Component Value Date   TRIG 78.0 02/09/2017   Lab Results  Component Value Date   CHOLHDL 3 02/09/2017   Lab Results  Component Value Date   HGBA1C 5.8 02/09/2017         Assessment & Plan:   Problem List Items Addressed This Visit    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 (Chronic)    Coughing significantly today      Relevant Orders   CBC with Differential/Platelet (Completed)   Diabetes mellitus type 2, controlled ([place]) (Chronic)    minimize simple carbs. Increase exercise as tolerated. Continue current meds      Relevant Orders   CBC with Differential/Platelet (Completed)   Hyperlipidemia, mixed    Encouraged heart healthy diet, increase exercise, avoid trans fats, consider a krill oil cap daily      Tobacco abuse    Encouraged complete cessation. Discussed need to quit as relates to risk of numerous cancers, cardiac and pulmonary disease as well as neurologic complications. Counseled for greater than 3 minutes      Type 2 diabetes mellitus with hyperglycemia, with long-term current use of insulin (HCC)    hgba1c acceptable, minimize simple carbs. Increase exercise as tolerated. Last A1C 5.9      NASH (nonalcoholic steatohepatitis)    Has been using her lactulose and spironolactone as prescribed has appt with gastroenterology soon.      Urine frequency - Primary    Urinalysis suspicious for UTI, will start on antibiotics.       Relevant Orders   Comprehensive metabolic panel (Completed)   CBC with Differential/Platelet (Completed)   Urinalysis   Urine Culture      I am having Ashley Savage start on amoxicillin-clavulanate. I am also having her maintain her albuterol, Vitamin D3, Polyvinyl Alcohol (LUBRICANT DROPS OP), BD PEN NEEDLE NANO U/F, rifaximin, ONE TOUCH ULTRA 2, mupirocin ointment, omeprazole, glucose blood, spironolactone, Tiotropium Bromide-Olodaterol, lactulose, albuterol, ibuprofen, mometasone, and LEVEMIR  FLEXTOUCH.  Meds ordered this encounter  Medications  . amoxicillin-clavulanate (AUGMENTIN) 875-125 MG tablet    Sig: Take 1 tablet by mouth 2 (two) times daily.    Dispense:  20 tablet    Refill:  0    CMA served as scribe during this visit. History, Physical and Plan performed by medical provider. Documentation and orders reviewed and attested to.  Penni Homans, MD

## 2017-04-13 NOTE — Assessment & Plan Note (Signed)
Has been using her lactulose and spironolactone as prescribed has appt with gastroenterology soon.

## 2017-04-13 NOTE — Assessment & Plan Note (Signed)
Coughing significantly today

## 2017-04-13 NOTE — Patient Instructions (Addendum)
Encouraged increased hydration and fiber in diet. Daily probiotics. If bowels not moving can use MOM 2 tbls po in 4 oz of warm prune juice by mouth every 2-3 days. If no results then repeat in 4 hours with  Dulcolax suppository pr, may repeat again in 4 more hours as needed. Seek care if symptoms worsen. Consider daily Miralax and/or Dulcolax if symptoms persist.  Can add Benefiber to the Miralax if need be  Lasix 1-2 tabs as needed for Weight gain> 3#/24 hours and/or increased edema    Urinary Frequency, Adult Urinary frequency means urinating more often than usual. People with urinary frequency urinate at least 8 times in 24 hours, even if they drink a normal amount of fluid. Although they urinate more often than normal, the total amount of urine produced in a day may be normal. Urinary frequency is also called pollakiuria. What are the causes? This condition may be caused by:  A urinary tract infection.  Obesity.  Bladder problems, such as bladder stones.  Caffeine or alcohol.  Eating food or drinking fluids that irritate the bladder. These include coffee, tea, soda, artificial sweeteners, citrus, tomato-based foods, and chocolate.  Certain medicines, such as medicines that help the body get rid of extra fluid (diuretics).  Muscle or nerve weakness.  Overactive bladder.  Chronic diabetes.  Interstitial cystitis.  In men, problems with the prostate, such as an enlarged prostate.  In women, pregnancy.  In some cases, the cause may not be known. What increases the risk? This condition is more likely to develop in:  Women who have gone through menopause.  Men with prostate problems.  People with a disease or injury that affects the nerves or spinal cord.  People who have or have had a condition that affects the brain, such as a stroke.  What are the signs or symptoms? Symptoms of this condition include:  Feeling an urgent need to urinate often. The stress and anxiety  of needing to find a bathroom quickly can make this urge worse.  Urinating 8 or more times in 24 hours.  Urinating as often as every 1 to 2 hours.  How is this diagnosed? This condition is diagnosed based on your symptoms, your medical history, and a physical exam. You may have tests, such as:  Blood tests.  Urine tests.  Imaging tests, such as X-rays or ultrasounds.  A bladder test.  A test of your neurological system. This is the body system that senses the need to urinate.  A test to check for problems in the urethra and bladder called cystoscopy.  You may also be asked to keep a bladder diary. A bladder diary is a record of what you eat and drink, how often you urinate, and how much you urinate. You may need to see a health care provider who specializes in conditions of the urinary tract (urologist) or kidneys (nephrologist). How is this treated? Treatment for this condition depends on the cause. Sometimes the condition goes away on its own and treatment is not necessary. If treatment is needed, it may include:  Taking medicine.  Learning exercises that strengthen the muscles that help control urination.  Following a bladder training program. This may include: ? Learning to delay going to the bathroom. ? Double urinating (voiding). This helps if you are not completely emptying your bladder. ? Scheduled voiding.  Making diet changes, such as: ? Avoiding caffeine. ? Drinking fewer fluids, especially alcohol. ? Not drinking in the evening. ? Not having  foods or drinks that may irritate the bladder. ? Eating foods that help prevent or ease constipation. Constipation can make this condition worse.  Having the nerves in your bladder stimulated. There are two options for stimulating the nerves to your bladder: ? Outpatient electrical nerve stimulation. This is done by your health care provider. ? Surgery to implant a bladder pacemaker. The pacemaker helps to control the urge  to urinate.  Follow these instructions at home:  Keep a bladder diary if told to by your health care provider.  Take over-the-counter and prescription medicines only as told by your health care provider.  Do any exercises as told by your health care provider.  Follow a bladder training program as told by your health care provider.  Make any recommended diet changes.  Keep all follow-up visits as told by your health care provider. This is important. Contact a health care provider if:  You start urinating more often.  You feel pain or irritation when you urinate.  You notice blood in your urine.  Your urine looks cloudy.  You develop a fever.  You begin vomiting. Get help right away if:  You are unable to urinate. This information is not intended to replace advice given to you by your health care provider. Make sure you discuss any questions you have with your health care provider. Document Released: 05/09/2009 Document Revised: 08/14/2015 Document Reviewed: 02/06/2015 Elsevier Interactive Patient Education  [employer].

## 2017-04-13 NOTE — Assessment & Plan Note (Signed)
hgba1c acceptable, minimize simple carbs. Increase exercise as tolerated. Last A1C 5.9

## 2017-04-13 NOTE — Assessment & Plan Note (Signed)
Encouraged complete cessation. Discussed need to quit as relates to risk of numerous cancers, cardiac and pulmonary disease as well as neurologic complications. Counseled for greater than 3 minutes 

## 2017-04-13 NOTE — Assessment & Plan Note (Signed)
minimize simple carbs. Increase exercise as tolerated. Continue current meds  

## 2017-04-14 ENCOUNTER — Other Ambulatory Visit: Payer: Self-pay

## 2017-04-14 DIAGNOSIS — R350 Frequency of micturition: Secondary | ICD-10-CM | POA: Diagnosis not present

## 2017-04-14 LAB — CBC WITH DIFFERENTIAL/PLATELET
BASOS ABS: 0 10*3/uL (ref 0.0–0.1)
BASOS PCT: 0.6 % (ref 0.0–3.0)
EOS ABS: 0.2 10*3/uL (ref 0.0–0.7)
Eosinophils Relative: 2.5 % (ref 0.0–5.0)
HEMATOCRIT: 43.6 % (ref 36.0–46.0)
Hemoglobin: 14.1 g/dL (ref 12.0–15.0)
LYMPHS PCT: 25.1 % (ref 12.0–46.0)
Lymphs Abs: 1.6 10*3/uL (ref 0.7–4.0)
MCHC: 32.4 g/dL (ref 30.0–36.0)
MCV: 99.4 fl (ref 78.0–100.0)
MONO ABS: 1.1 10*3/uL — AB (ref 0.1–1.0)
Monocytes Relative: 17.3 % — ABNORMAL HIGH (ref 3.0–12.0)
Neutro Abs: 3.5 10*3/uL (ref 1.4–7.7)
Neutrophils Relative %: 54.5 % (ref 43.0–77.0)
Platelets: 132 10*3/uL — ABNORMAL LOW (ref 150.0–400.0)
RBC: 4.39 Mil/uL (ref 3.87–5.11)
RDW: 15.1 % (ref 11.5–15.5)
WBC: 6.4 10*3/uL (ref 4.0–10.5)

## 2017-04-14 LAB — URINALYSIS, ROUTINE W REFLEX MICROSCOPIC
BILIRUBIN URINE: NEGATIVE
Hgb urine dipstick: NEGATIVE
LEUKOCYTES UA: NEGATIVE
Nitrite: POSITIVE — AB
PH: 5.5 (ref 5.0–8.0)
Total Protein, Urine: NEGATIVE
URINE GLUCOSE: 250 — AB
UROBILINOGEN UA: 0.2 (ref 0.0–1.0)

## 2017-04-14 LAB — COMPREHENSIVE METABOLIC PANEL
ALK PHOS: 120 U/L — AB (ref 39–117)
ALT: 26 U/L (ref 0–35)
AST: 40 U/L — AB (ref 0–37)
Albumin: 2.6 g/dL — ABNORMAL LOW (ref 3.5–5.2)
BILIRUBIN TOTAL: 2.8 mg/dL — AB (ref 0.2–1.2)
BUN: 20 mg/dL (ref 6–23)
CHLORIDE: 104 meq/L (ref 96–112)
CO2: 30 mEq/L (ref 19–32)
CREATININE: 0.96 mg/dL (ref 0.40–1.20)
Calcium: 9.1 mg/dL (ref 8.4–10.5)
GFR: 60.38 mL/min (ref >60.00)
Glucose, Bld: 134 mg/dL — ABNORMAL HIGH (ref 70–99)
Potassium: 4.7 mEq/L (ref 3.5–5.1)
SODIUM: 137 meq/L (ref 135–145)
TOTAL PROTEIN: 5.4 g/dL — AB (ref 6.0–8.3)

## 2017-04-14 NOTE — Patient Outreach (Signed)
[facility]) Care Management  04/14/2017  PRAJNA VANDERPOOL [birth year] [id number]  Nurse call line follow up.  Telephone call to patient for assessment.  HIPAA verified.  Patient states she contacted the nurse call line due to having frequent urination symptoms and some nausea. Patient states she was seen by her doctor on yesterday. Patient reports she was tested for possible urinary infection. Patient states she doctor gave her a prescription but she will not take it until she hears back from her doctors office with the results of the urine test.  Patient reports her symptoms are not as bad today.  States he is not urinating as frequently. Patient states her legs are not swelling today.  PLAN: Patient will report back to [facility] results of her urine testing and / or if she started antibiotics within 1 week.   [name] RN,BSN,CCM [facility] Telephonic  [phone]

## 2017-04-15 ENCOUNTER — Other Ambulatory Visit: Payer: Self-pay | Admitting: Gastroenterology

## 2017-04-16 ENCOUNTER — Other Ambulatory Visit: Payer: Self-pay

## 2017-04-16 NOTE — Patient Outreach (Signed)
[facility]) Care Management  04/16/2017  Ashley Savage [birth year] [id number]   Telephone call to patient for assessment follow up.  Unable to reach patient. HIPAA compliant voice message left with call back phone numbe.r   PLAN; RNCM will attempt 2nd telephone call to patient within 1 week.   [name] RN,BSN,CCM [facility] Telephonic  [phone]

## 2017-04-17 LAB — URINE CULTURE
MICRO NUMBER:: 81035685
SPECIMEN QUALITY:: ADEQUATE

## 2017-04-18 NOTE — Assessment & Plan Note (Signed)
Urinalysis suspicious for UTI, will start on antibiotics.

## 2017-04-18 NOTE — Assessment & Plan Note (Signed)
Encouraged heart healthy diet, increase exercise, avoid trans fats, consider a krill oil cap daily 

## 2017-04-19 ENCOUNTER — Encounter: Payer: Self-pay | Admitting: Family Medicine

## 2017-04-19 ENCOUNTER — Other Ambulatory Visit: Payer: Self-pay

## 2017-04-19 NOTE — Patient Outreach (Signed)
[facility]) Care Management  04/19/2017  Ashley Savage October 30, 1940 [id number]  Telephone assessment follow up: Call to patient for telephone assessment. HIPAA verified with patient. Patient states she has not heard from the doctor's office regarding her urine test. Patient states she started taking the antibiotic that was prescribed for her.  RNCM advised patient to call her primary MD office regarding her urine test results and whether she should continue taking her antibiotics.  Patient states she continues to take her new COPD medication, Stiolto-respimate. Patient states she has noticed some improvement in her breathing with the new medication.  RNCM reviewed COPD action plan/ zones with patient.  Patient state she will get the shingles vaccine this year. Patient reports she had her flu shot and annual physical on 03/30/17.  Patient reports she has weeping from one of her legs due to a scratch. Patient states she is wrapping her leg one time per day. Patient states she is also applying a prescription ointment that her doctor gave from a previous time she had a scratch and weeping. Patient states she has edema in her legs, ankles and feet do to her liver problems. Patient reports she continues to wear compression hose as needed. Patient states she has a follow up with Dr. Silverio Decamp in November 2018.  Patient states she is not able to exercise like she would like to due to having a painful knee. Patient states her doctor said she was not a good candidate for a knee replacement at this time. Patient states she would like to exercise more to help improve her overall health.  RNCM discussed with patient doing chair exercises at home. Patient states she is doing this.   PLAN: RNCM will follow up with patient within the month of October 2018.   [name] RN,BSN,CCM [facility] Telephonic  [phone]

## 2017-04-23 ENCOUNTER — Telehealth: Payer: Self-pay

## 2017-04-23 NOTE — Telephone Encounter (Signed)
Called Pt as advised and she stated that she's "actually feeling much better" at this time. I advised Pt that if her symptoms returned or got worse to feel free to come in and follow up for additional or repeat labs as needed. Pt stated compliance.

## 2017-04-23 NOTE — Telephone Encounter (Signed)
-----   Message from Mosie Lukes, MD sent at 04/19/2017  4:04 PM EDT ----- Let her know her urine is infected but they did not check the Augmentin she took for sensitivities. If she is feeling better would recheck a urine and culture at her discretion to confirm clearing if she is not would change to Bactrim DS 1 tab po bid x 3 days

## 2017-05-03 ENCOUNTER — Encounter: Payer: Self-pay | Admitting: Pulmonary Disease

## 2017-05-03 ENCOUNTER — Telehealth: Payer: Self-pay | Admitting: Pulmonary Disease

## 2017-05-03 NOTE — Telephone Encounter (Signed)
May 03, 2017  Me  to [name]       3:48 PM  Hello Ms. Hentges. I will call you now to schedule an appointment for this Thursday since we are not allowed to schedule appointments via [place].    This MyChart message has not been read.  [name]  to Rigoberto Noel, MD       3:41 PM  Can I see someone at the [facility] on Thursday.I am having a hard time breathing with lite activity.    Left message for patient to call back to get scheduled to see TP in [place] this Thursday.

## 2017-05-04 ENCOUNTER — Other Ambulatory Visit: Payer: Self-pay | Admitting: Gastroenterology

## 2017-05-04 NOTE — Telephone Encounter (Signed)
Patient has been scheduled to see TP this Thursday at [employer]. Will close this message.

## 2017-05-05 ENCOUNTER — Inpatient Hospital Stay (HOSPITAL_BASED_OUTPATIENT_CLINIC_OR_DEPARTMENT_OTHER)
Admission: EM | Admit: 2017-05-05 | Discharge: 2017-05-12 | DRG: 190 | Disposition: A | Discharge status: Skilled Nursing Facility | Payer: PPO | Attending: Internal Medicine | Admitting: Internal Medicine

## 2017-05-05 ENCOUNTER — Encounter: Payer: Self-pay | Admitting: Family Medicine

## 2017-05-05 ENCOUNTER — Encounter (HOSPITAL_BASED_OUTPATIENT_CLINIC_OR_DEPARTMENT_OTHER): Payer: Self-pay | Admitting: Radiology

## 2017-05-05 ENCOUNTER — Emergency Department (HOSPITAL_BASED_OUTPATIENT_CLINIC_OR_DEPARTMENT_OTHER): Payer: PPO

## 2017-05-05 DIAGNOSIS — F17210 Nicotine dependence, cigarettes, uncomplicated: Secondary | ICD-10-CM | POA: Diagnosis present

## 2017-05-05 DIAGNOSIS — Z823 Family history of stroke: Secondary | ICD-10-CM

## 2017-05-05 DIAGNOSIS — Z794 Long term (current) use of insulin: Secondary | ICD-10-CM

## 2017-05-05 DIAGNOSIS — K219 Gastro-esophageal reflux disease without esophagitis: Secondary | ICD-10-CM | POA: Diagnosis present

## 2017-05-05 DIAGNOSIS — F410 Panic disorder [episodic paroxysmal anxiety] without agoraphobia: Secondary | ICD-10-CM | POA: Diagnosis not present

## 2017-05-05 DIAGNOSIS — J9601 Acute respiratory failure with hypoxia: Secondary | ICD-10-CM | POA: Diagnosis present

## 2017-05-05 DIAGNOSIS — Z853 Personal history of malignant neoplasm of breast: Secondary | ICD-10-CM | POA: Diagnosis not present

## 2017-05-05 DIAGNOSIS — Z9181 History of falling: Secondary | ICD-10-CM | POA: Diagnosis not present

## 2017-05-05 DIAGNOSIS — E782 Mixed hyperlipidemia: Secondary | ICD-10-CM | POA: Diagnosis not present

## 2017-05-05 DIAGNOSIS — K7200 Acute and subacute hepatic failure without coma: Secondary | ICD-10-CM | POA: Diagnosis not present

## 2017-05-05 DIAGNOSIS — D696 Thrombocytopenia, unspecified: Secondary | ICD-10-CM

## 2017-05-05 DIAGNOSIS — J441 Chronic obstructive pulmonary disease with (acute) exacerbation: Principal | ICD-10-CM | POA: Diagnosis present

## 2017-05-05 DIAGNOSIS — Z8249 Family history of ischemic heart disease and other diseases of the circulatory system: Secondary | ICD-10-CM | POA: Diagnosis not present

## 2017-05-05 DIAGNOSIS — Z833 Family history of diabetes mellitus: Secondary | ICD-10-CM

## 2017-05-05 DIAGNOSIS — Z720 Tobacco use: Secondary | ICD-10-CM | POA: Diagnosis present

## 2017-05-05 DIAGNOSIS — E1142 Type 2 diabetes mellitus with diabetic polyneuropathy: Secondary | ICD-10-CM

## 2017-05-05 DIAGNOSIS — R262 Difficulty in walking, not elsewhere classified: Secondary | ICD-10-CM | POA: Diagnosis not present

## 2017-05-05 DIAGNOSIS — K7460 Unspecified cirrhosis of liver: Secondary | ICD-10-CM | POA: Diagnosis not present

## 2017-05-05 DIAGNOSIS — E119 Type 2 diabetes mellitus without complications: Secondary | ICD-10-CM | POA: Diagnosis not present

## 2017-05-05 DIAGNOSIS — J9621 Acute and chronic respiratory failure with hypoxia: Secondary | ICD-10-CM | POA: Diagnosis not present

## 2017-05-05 DIAGNOSIS — F419 Anxiety disorder, unspecified: Secondary | ICD-10-CM

## 2017-05-05 DIAGNOSIS — I340 Nonrheumatic mitral (valve) insufficiency: Secondary | ICD-10-CM | POA: Diagnosis not present

## 2017-05-05 DIAGNOSIS — F329 Major depressive disorder, single episode, unspecified: Secondary | ICD-10-CM | POA: Diagnosis present

## 2017-05-05 DIAGNOSIS — E785 Hyperlipidemia, unspecified: Secondary | ICD-10-CM | POA: Diagnosis not present

## 2017-05-05 DIAGNOSIS — Z923 Personal history of irradiation: Secondary | ICD-10-CM | POA: Diagnosis not present

## 2017-05-05 DIAGNOSIS — N179 Acute kidney failure, unspecified: Secondary | ICD-10-CM | POA: Diagnosis present

## 2017-05-05 DIAGNOSIS — M6281 Muscle weakness (generalized): Secondary | ICD-10-CM | POA: Diagnosis not present

## 2017-05-05 DIAGNOSIS — K7581 Nonalcoholic steatohepatitis (NASH): Secondary | ICD-10-CM | POA: Diagnosis present

## 2017-05-05 DIAGNOSIS — R488 Other symbolic dysfunctions: Secondary | ICD-10-CM | POA: Diagnosis not present

## 2017-05-05 DIAGNOSIS — J449 Chronic obstructive pulmonary disease, unspecified: Secondary | ICD-10-CM | POA: Diagnosis not present

## 2017-05-05 DIAGNOSIS — R0602 Shortness of breath: Secondary | ICD-10-CM | POA: Diagnosis not present

## 2017-05-05 DIAGNOSIS — E875 Hyperkalemia: Secondary | ICD-10-CM | POA: Diagnosis present

## 2017-05-05 DIAGNOSIS — J80 Acute respiratory distress syndrome: Secondary | ICD-10-CM | POA: Diagnosis not present

## 2017-05-05 DIAGNOSIS — R2681 Unsteadiness on feet: Secondary | ICD-10-CM | POA: Diagnosis not present

## 2017-05-05 DIAGNOSIS — E1365 Other specified diabetes mellitus with hyperglycemia: Secondary | ICD-10-CM | POA: Diagnosis not present

## 2017-05-05 HISTORY — DX: Chronic obstructive pulmonary disease with (acute) exacerbation: J44.1

## 2017-05-05 LAB — CBC WITH DIFFERENTIAL/PLATELET
Basophils Absolute: 0 10*3/uL (ref 0.0–0.1)
Basophils Relative: 1 %
EOS ABS: 0.2 10*3/uL (ref 0.0–0.7)
EOS PCT: 3 %
HCT: 41.6 % (ref 36.0–46.0)
Hemoglobin: 14.4 g/dL (ref 12.0–15.0)
LYMPHS ABS: 1.8 10*3/uL (ref 0.7–4.0)
LYMPHS PCT: 28 %
MCH: 32.4 pg (ref 26.0–34.0)
MCHC: 34.6 g/dL (ref 30.0–36.0)
MCV: 93.7 fL (ref 78.0–100.0)
MONO ABS: 1.1 10*3/uL — AB (ref 0.1–1.0)
MONOS PCT: 16 %
Neutro Abs: 3.3 10*3/uL (ref 1.7–7.7)
Neutrophils Relative %: 52 %
PLATELETS: 110 10*3/uL — AB (ref 150–400)
RBC: 4.44 MIL/uL (ref 3.87–5.11)
RDW: 15.2 % (ref 11.5–15.5)
WBC: 6.4 10*3/uL (ref 4.0–10.5)

## 2017-05-05 LAB — COMPREHENSIVE METABOLIC PANEL
ALBUMIN: 2.8 g/dL — AB (ref 3.5–5.0)
ALT: 33 U/L (ref 14–54)
AST: 57 U/L — AB (ref 15–41)
Alkaline Phosphatase: 157 U/L — ABNORMAL HIGH (ref 38–126)
Anion gap: 6 (ref 5–15)
BUN: 18 mg/dL (ref 6–20)
CHLORIDE: 102 mmol/L (ref 101–111)
CO2: 29 mmol/L (ref 22–32)
CREATININE: 0.95 mg/dL (ref 0.44–1.00)
Calcium: 9.2 mg/dL (ref 8.9–10.3)
GFR calc Af Amer: >60 mL/min (ref >60)
GFR, EST NON AFRICAN AMERICAN: 58 mL/min — AB (ref >60)
GLUCOSE: 181 mg/dL — AB (ref 65–99)
POTASSIUM: 4.9 mmol/L (ref 3.5–5.1)
SODIUM: 137 mmol/L (ref 135–145)
Total Bilirubin: 1.9 mg/dL — ABNORMAL HIGH (ref 0.3–1.2)
Total Protein: 5.9 g/dL — ABNORMAL LOW (ref 6.5–8.1)

## 2017-05-05 LAB — AMMONIA: Ammonia: 47 umol/L — ABNORMAL HIGH (ref 9–35)

## 2017-05-05 LAB — TROPONIN I

## 2017-05-05 MED ORDER — ALBUTEROL SULFATE (2.5 MG/3ML) 0.083% IN NEBU
2.5000 mg | INHALATION_SOLUTION | Freq: Once | RESPIRATORY_TRACT | Status: AC
  Administered 2017-05-05: 2.5 mg via RESPIRATORY_TRACT

## 2017-05-05 MED ORDER — IPRATROPIUM-ALBUTEROL 0.5-2.5 (3) MG/3ML IN SOLN
RESPIRATORY_TRACT | Status: AC
  Administered 2017-05-05: 3 mL
  Filled 2017-05-05: qty 3

## 2017-05-05 MED ORDER — ALBUTEROL (5 MG/ML) CONTINUOUS INHALATION SOLN
10.0000 mg/h | INHALATION_SOLUTION | Freq: Once | RESPIRATORY_TRACT | Status: AC
  Administered 2017-05-05: 10 mg/h via RESPIRATORY_TRACT
  Filled 2017-05-05: qty 20

## 2017-05-05 MED ORDER — METHYLPREDNISOLONE SODIUM SUCC 125 MG IJ SOLR
125.0000 mg | Freq: Once | INTRAMUSCULAR | Status: AC
  Administered 2017-05-05: 125 mg via INTRAVENOUS
  Filled 2017-05-05: qty 2

## 2017-05-05 MED ORDER — IPRATROPIUM-ALBUTEROL 0.5-2.5 (3) MG/3ML IN SOLN
3.0000 mL | Freq: Once | RESPIRATORY_TRACT | Status: AC
  Administered 2017-05-05: 3 mL via RESPIRATORY_TRACT

## 2017-05-05 MED ORDER — ALBUTEROL (5 MG/ML) CONTINUOUS INHALATION SOLN
10.0000 mg/h | INHALATION_SOLUTION | RESPIRATORY_TRACT | Status: AC
  Administered 2017-05-05: 10 mg/h via RESPIRATORY_TRACT

## 2017-05-05 MED ORDER — ALBUTEROL SULFATE (2.5 MG/3ML) 0.083% IN NEBU
INHALATION_SOLUTION | RESPIRATORY_TRACT | Status: AC
  Administered 2017-05-05: 2.5 mg
  Filled 2017-05-05: qty 3

## 2017-05-05 NOTE — ED Notes (Signed)
Pt claimed that from time to time it leaks and all she does is wrapped her BLE.  She is already taking Lasix and Spironolactone.

## 2017-05-05 NOTE — ED Notes (Signed)
Patient is not on home O2 but still smoke 5 cigarettes a day.

## 2017-05-05 NOTE — ED Provider Notes (Signed)
[facility] DEPT [facility] Provider Note   CSN: [id number] Arrival date & time: 05/05/17  1832     History   Chief Complaint Chief Complaint  Patient presents with  . Shortness of Breath    HPI Ashley Savage is a 76 y.o. female.  Pt presents to the ED today with sob.  Sx have been going on for a few days.  Pt has a hx of COPD and continues to smoke, although she is trying to cut back. She said she's been using her home meds w/o help.  She did her pulmonologist for an appt, but that is not until tomorrow.  Pt can't wait any more.  Pt is not on home O2.      Past Medical History:  Diagnosis Date  . Allergic state 11/10/2016  . Anxiety   . Arthritis of both knees 10/01/2013  . Benign paroxysmal positional vertigo 10/01/2013  . Cancer [facility]) breast ca  right  . COPD (chronic obstructive pulmonary disease) ([place]) 10/01/2013  . Depression   . Dermatitis 03/30/2017  . Diabetes mellitus type 2  . Emphysema   . Encephalopathy, hepatic ([place]) 06/07/2014  . Esophageal reflux 10/01/2013  . Fall 07/30/2016  . Hyperlipidemia   . Hyperlipidemia, mixed   . Increased ammonia level 11/25/2014  . NASH (nonalcoholic steatohepatitis) 08/26/2015  . Neck pain 10/01/2013  . Neuropathy    feet   . Osteopenia 03/30/2017  . Overactive bladder 12/10/2013  . Panic attacks   . Pedal edema 12/10/2013  . Personal history of radiation therapy   . Preventative health care 03/08/2016  . Tobacco abuse disorder 02/01/2014    Patient Active Problem List   Diagnosis Date Noted  . COPD with acute exacerbation ([place]) 05/05/2017  . Urine frequency 04/13/2017  . Osteopenia 03/30/2017  . Dermatitis 03/30/2017  . Allergic state 11/10/2016  . 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 Portal hypertensive gastropathy ([place])   . Lower back injury, initial encounter 08/05/2016  . Fall 07/30/2016  . Preventative health care 03/08/2016  . Muscle spasm 02/25/2016  . Chronic respiratory failure ([place]) 08/29/2015  . NASH  (nonalcoholic steatohepatitis) 08/26/2015  . Type 2 diabetes mellitus with hyperglycemia, with long-term current use of insulin ([place])   . Cirrhosis of liver without ascites ([place]) 07/31/2015  . Diarrhea 12/30/2014  . Increased ammonia level 11/25/2014  . Diabetes mellitus type 2, controlled ([place]) 10/16/2014  . Encephalopathy, hepatic ([place]) 06/07/2014  . Right knee pain 06/07/2014  . Sun-damaged skin 02/01/2014  . Anxiety and depression 02/01/2014  . Tobacco abuse 02/01/2014  . Medicare annual wellness visit, subsequent 02/01/2014  . Pedal edema 12/10/2013  . Overactive bladder 12/10/2013  . Abdominal aortic aneurysm ([place]) 10/01/2013  . Arthritis of right knee 10/01/2013  . Benign paroxysmal positional vertigo 10/01/2013  . Neck pain 10/01/2013  . Esophageal reflux 10/01/2013  . Thrombocytopenia ([place]) 03/17/2012  . 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   . Hyperlipidemia, mixed   . Breast cancer [facility])     Past Surgical History:  Procedure Laterality Date  . APPENDECTOMY  2007  . BREAST SURGERY  2009 right  . CATARACT EXTRACTION     x 2  . ESOPHAGOGASTRODUODENOSCOPY (EGD) WITH PROPOFOL N/A 08/14/2016   Procedure: ESOPHAGOGASTRODUODENOSCOPY (EGD) WITH PROPOFOL;  Surgeon: Mauri Pole, MD;  Location: [facility] ENDOSCOPY;  Service: Endoscopy;  Laterality: N/A;  . [place]  . KNEE SURGERY    . MANDIBLE FRACTURE SURGERY    .  MASTECTOMY    . PILONIDAL CYST EXCISION    . TONSILLECTOMY      OB History    No data available       Home Medications    Prior to Admission medications   Medication Sig Start Date End Date Taking? Authorizing Provider  albuterol (ACCUNEB) 1.25 MG/3ML nebulizer solution USE 1 VIAL VIA NEBULIZER EVERY 6 HOURS AS NEEDED FOR WHEEZING 03/22/17   Rigoberto Noel, MD  albuterol (PROVENTIL HFA;VENTOLIN HFA) 108 (90 Base) MCG/ACT inhaler Inhale 2 puffs into the lungs every 6 (six) hours as needed for wheezing or  shortness of breath. Only dispense Ventolin 02/25/16   Mosie Lukes, MD  amoxicillin-clavulanate (AUGMENTIN) 875-125 MG tablet Take 1 tablet by mouth 2 (two) times daily. 04/13/17   Mosie Lukes, MD  BD PEN NEEDLE NANO U/F 32G X 4 MM MISC USE AS DIRECTED WITH LEVEMIR FLEXPEN 08/17/16   Mosie Lukes, MD  Blood Glucose Monitoring Suppl (ONE TOUCH ULTRA 2) w/Device KIT Use as directed once daily to check blood sugar.  DX E11.9 09/14/16   Mosie Lukes, MD  Cholecalciferol (VITAMIN D3) 2000 units TABS Take 2,000 Units by mouth every morning.    [provider]  furosemide (LASIX) 20 MG tablet TAKE 1 TABLET BY MOUTH TWICE DAILY 04/15/17   Nandigam, Karleen Hampshire V, MD  glucose blood (ONE TOUCH ULTRA TEST) test strip CHECK BLOOD SUGAR TWICE DAILY AS DIRECTED 02/09/17   Mosie Lukes, MD  ibuprofen (ADVIL,MOTRIN) 200 MG tablet Take 200 mg by mouth every 6 (six) hours as needed for mild pain.    [provider]  lactulose (CHRONULAC) 10 GM/15ML solution TAKE 45ML BY MOUTH TWICE DAILY AS NEEDED FOR MILD CONSTIPATION 03/19/17   Mosie Lukes, MD  LEVEMIR FLEXTOUCH 100 UNIT/ML Pen INJECT 14 UNITS UNDER THE SKIN EVERY DAY AT 10PM 04/08/17   Mosie Lukes, MD  mometasone (NASONEX) 50 MCG/ACT nasal spray Place 2 sprays into the nose daily. 03/30/17   Mosie Lukes, MD  mupirocin ointment (BACTROBAN) 2 % Place on application over lesion on leg twice daily. 01/08/17   Shelda Pal, DO  omeprazole (PRILOSEC) 20 MG capsule TAKE 1 CAPSULE(20 MG) BY MOUTH DAILY 01/26/17   Mosie Lukes, MD  Polyvinyl Alcohol (LUBRICANT DROPS OP) Apply 1 drop to eye daily as needed (dry eyes).    [provider]  rifaximin (XIFAXAN) 550 MG TABS tablet Take 1 tablet (550 mg total) by mouth 2 (two) times daily. 09/04/16   [place], Amy S, PA-C  spironolactone (ALDACTONE) 50 MG tablet TAKE 2 TABLETS(100 MG) BY MOUTH DAILY 05/05/17   Nandigam, Venia Minks, MD  Tiotropium Bromide-Olodaterol (STIOLTO  RESPIMAT) 2.5-2.5 MCG/ACT AERS Inhale 2 puffs into the lungs daily. 03/15/17   Rigoberto Noel, MD    Family History Family History  Problem Relation Age of Onset  . Heart failure Father   . COPD Father   . Arthritis Father 49  . Stroke Mother   . Arthritis Mother 17  . Hyperlipidemia Mother   . Hypertension Mother   . Diabetes Mother   . Diabetes Sister   . Breast cancer Unknown   . Breast cancer Maternal Aunt   . Asthma Maternal Aunt   . Birth defects Maternal Aunt   . Alcohol abuse Maternal Uncle   . Breast cancer Maternal Aunt     Social History Social History  Substance Use Topics  . Smoking status: Current Some Day  Smoker    Packs/day: 0.25    Years: 58.00    Types: Cigarettes    Start date: 07/27/1964  . Smokeless tobacco: Never Used  . Alcohol use No     Allergies   Citalopram; Ciprofloxacin; Erythromycin; Glimepiride; Prednisone; and Versed [midazolam]   Review of Systems Review of Systems  Respiratory: Positive for cough, shortness of breath and wheezing.   All other systems reviewed and are negative.    Physical Exam Updated Vital Signs BP (!) 133/54   Pulse 95   Temp (!) 97.1 F (36.2 C) (Oral)   Resp 19   Ht '5\' 6"'  (1.676 m)   Wt 65.8 kg (145 lb)   SpO2 100%   BMI 23.40 kg/m   Physical Exam  Constitutional: She is oriented to person, place, and time. She appears well-developed and well-nourished. She appears distressed.  HENT:  Head: Normocephalic and atraumatic.  Right Ear: External ear normal.  Left Ear: External ear normal.  Nose: Nose normal.  Mouth/Throat: Oropharynx is clear and moist.  Eyes: Pupils are equal, round, and reactive to light. Conjunctivae and EOM are normal.  Neck: Normal range of motion. Neck supple.  Cardiovascular: Normal rate, regular rhythm, normal heart sounds and intact distal pulses.   Pulmonary/Chest: She is in respiratory distress. She has wheezes.  Abdominal: Soft. Bowel sounds are normal.    Musculoskeletal: She exhibits edema.  Neurological: She is alert and oriented to person, place, and time.  Skin: Skin is warm.  Psychiatric: She has a normal mood and affect. Her behavior is normal. Thought content normal.  Nursing note and vitals reviewed.    ED Treatments / Results  Labs (all labs ordered are listed, but only abnormal results are displayed) Labs Reviewed  COMPREHENSIVE METABOLIC PANEL - Abnormal; Notable for the following:       Result Value   Glucose, Bld 181 (*)    Total Protein 5.9 (*)    Albumin 2.8 (*)    AST 57 (*)    Alkaline Phosphatase 157 (*)    Total Bilirubin 1.9 (*)    GFR calc non Af Amer 58 (*)    All other components within normal limits  CBC WITH DIFFERENTIAL/PLATELET - Abnormal; Notable for the following:    Platelets 110 (*)    Monocytes Absolute 1.1 (*)    All other components within normal limits  AMMONIA - Abnormal; Notable for the following:    Ammonia 47 (*)    All other components within normal limits  TROPONIN I  URINALYSIS, ROUTINE W REFLEX MICROSCOPIC    EKG  EKG Interpretation  Date/Time:  Wednesday May 05 2017 18:56:58 EDT Ventricular Rate:  88 PR Interval:    QRS Duration: 97 QT Interval:  377 QTC Calculation: 457 R Axis:   62 Text Interpretation:  Sinus rhythm Atrial premature complex Right atrial enlargement Borderline T abnormalities, lateral leads Minimal ST elevation, anterior leads Confirmed by Isla Pence [phone]) on 05/05/2017 7:00:41 PM       Radiology Dg Chest Port 1 View  Result Date: 05/05/2017 CLINICAL DATA:  Shortness of Breath EXAM: PORTABLE CHEST 1 VIEW COMPARISON:  01/17/2017 FINDINGS: There is hyperinflation of the lungs compatible with COPD. Heart and mediastinal contours are within normal limits. No focal opacities or effusions. No acute bony abnormality. IMPRESSION: COPD.  No active disease. Electronically Signed   By: Rolm Baptise M.D.   On: 05/05/2017 19:41     Procedures Procedures (including critical care time)  Medications Ordered  in ED Medications  albuterol (PROVENTIL,VENTOLIN) solution continuous neb (10 mg/hr Nebulization New Bag/Given 05/05/17 2042)  ipratropium-albuterol (DUONEB) 0.5-2.5 (3) MG/3ML nebulizer solution 3 mL (3 mLs Nebulization Given 05/05/17 1845)  albuterol (PROVENTIL) (2.5 MG/3ML) 0.083% nebulizer solution 2.5 mg (2.5 mg Nebulization Given 05/05/17 1845)  albuterol (PROVENTIL) (2.5 MG/3ML) 0.083% nebulizer solution (2.5 mg  Given 05/05/17 1846)  ipratropium-albuterol (DUONEB) 0.5-2.5 (3) MG/3ML nebulizer solution (3 mLs  Given 05/05/17 1846)  albuterol (PROVENTIL,VENTOLIN) solution continuous neb (10 mg/hr Nebulization Given 05/05/17 1908)  methylPREDNISolone sodium succinate (SOLU-MEDROL) 125 mg/2 mL injection 125 mg (125 mg Intravenous Given 05/05/17 1911)     Initial Impression / Assessment and Plan / ED Course  I have reviewed the triage vital signs and the nursing notes.  Pertinent labs & imaging results that were available during my care of the patient were reviewed by me and considered in my medical decision making (see chart for details).    CRITICAL CARE Performed by: Isla Pence   Total critical care time: 30 minutes  Critical care time was exclusive of separately billable procedures and treating other patients.  Critical care was necessary to treat or prevent imminent or life-threatening deterioration.  Critical care was time spent personally by me on the following activities: development of treatment plan with patient and/or surrogate as well as nursing, discussions with consultants, evaluation of patient's response to treatment, examination of patient, obtaining history from patient or surrogate, ordering and performing treatments and interventions, ordering and review of laboratory studies, ordering and review of radiographic studies, pulse oximetry and re-evaluation of patient's  condition.  Pt placed on bipap shortly after arrival due to resp distress.  This has helped her quite a bit.  She was d/w Dr. Myna Hidalgo (hospitalist) at [facility] for admission.  Final Clinical Impressions(s) / ED Diagnoses   Final diagnoses:  COPD exacerbation ([place])  Acute on chronic respiratory failure with hypoxia ([place])  Tobacco abuse    New Prescriptions New Prescriptions   No medications on file     Isla Pence, MD 05/05/17 2106

## 2017-05-05 NOTE — ED Notes (Signed)
Attempted to call for report. 

## 2017-05-05 NOTE — ED Notes (Signed)
ED Provider at bedside. 

## 2017-05-05 NOTE — ED Notes (Signed)
Patient removed from [place] and placed on 5 liters [place]. Patient maintaining an oxygen saturation of 90-94%. Patient is tolerating and wishes not to be placed back on Bipap. Patient explained that if her respiratory condition changes she will need to be placed back on Bipap. Patient understands and is OK with that explanation. Will continue to monitor patient until transport arrives.

## 2017-05-05 NOTE — ED Triage Notes (Signed)
C/o SOB x 4 days-presents to triage in w/c-RT in for assessment

## 2017-05-05 NOTE — ED Notes (Signed)
BiPap is currently off and an O2 [place] was placed at 5 Lpm, O2 sats 94%.  Pt is alert and oriented and verbalized that she felt better.

## 2017-05-06 ENCOUNTER — Encounter (HOSPITAL_COMMUNITY): Payer: Self-pay | Admitting: Family Medicine

## 2017-05-06 ENCOUNTER — Encounter: Payer: Self-pay | Admitting: Adult Health

## 2017-05-06 ENCOUNTER — Ambulatory Visit: Payer: PPO | Admitting: Adult Health

## 2017-05-06 DIAGNOSIS — J9621 Acute and chronic respiratory failure with hypoxia: Secondary | ICD-10-CM

## 2017-05-06 DIAGNOSIS — J441 Chronic obstructive pulmonary disease with (acute) exacerbation: Principal | ICD-10-CM

## 2017-05-06 LAB — GLUCOSE, CAPILLARY
GLUCOSE-CAPILLARY: 249 mg/dL — AB (ref 65–99)
Glucose-Capillary: 267 mg/dL — ABNORMAL HIGH (ref 65–99)
Glucose-Capillary: 310 mg/dL — ABNORMAL HIGH (ref 65–99)
Glucose-Capillary: 284 mg/dL — ABNORMAL HIGH (ref 65–99)

## 2017-05-06 LAB — EXPECTORATED SPUTUM ASSESSMENT W GRAM STAIN, RFLX TO RESP C

## 2017-05-06 LAB — COMPREHENSIVE METABOLIC PANEL
ALBUMIN: 2.4 g/dL — AB (ref 3.5–5.0)
ALT: 30 U/L (ref 14–54)
AST: 52 U/L — AB (ref 15–41)
Alkaline Phosphatase: 149 U/L — ABNORMAL HIGH (ref 38–126)
Anion gap: 12 (ref 5–15)
BILIRUBIN TOTAL: 1.7 mg/dL — AB (ref 0.3–1.2)
BUN: 24 mg/dL — AB (ref 6–20)
CHLORIDE: 101 mmol/L (ref 101–111)
CO2: 22 mmol/L (ref 22–32)
Calcium: 8.6 mg/dL — ABNORMAL LOW (ref 8.9–10.3)
Creatinine, Ser: 1.23 mg/dL — ABNORMAL HIGH (ref 0.44–1.00)
GFR calc Af Amer: 49 mL/min — ABNORMAL LOW (ref >60)
GFR calc non Af Amer: 42 mL/min — ABNORMAL LOW (ref >60)
GLUCOSE: 307 mg/dL — AB (ref 65–99)
POTASSIUM: 4.9 mmol/L (ref 3.5–5.1)
Sodium: 135 mmol/L (ref 135–145)
TOTAL PROTEIN: 5.4 g/dL — AB (ref 6.5–8.1)

## 2017-05-06 LAB — CBC WITH DIFFERENTIAL/PLATELET
Basophils Absolute: 0 10*3/uL (ref 0.0–0.1)
Basophils Relative: 0 %
EOS PCT: 0 %
Eosinophils Absolute: 0 10*3/uL (ref 0.0–0.7)
HCT: 39.4 % (ref 36.0–46.0)
Hemoglobin: 13.5 g/dL (ref 12.0–15.0)
LYMPHS ABS: 0.6 10*3/uL — AB (ref 0.7–4.0)
LYMPHS PCT: 11 %
MCH: 32.6 pg (ref 26.0–34.0)
MCHC: 34.3 g/dL (ref 30.0–36.0)
MCV: 95.2 fL (ref 78.0–100.0)
MONO ABS: 0 10*3/uL — AB (ref 0.1–1.0)
Monocytes Relative: 1 %
Neutro Abs: 4.8 10*3/uL (ref 1.7–7.7)
Neutrophils Relative %: 88 %
Platelets: 104 10*3/uL — ABNORMAL LOW (ref 150–400)
RBC: 4.14 MIL/uL (ref 3.87–5.11)
RDW: 15.2 % (ref 11.5–15.5)
WBC: 5.5 10*3/uL (ref 4.0–10.5)

## 2017-05-06 LAB — EXPECTORATED SPUTUM ASSESSMENT W REFEX TO RESP CULTURE

## 2017-05-06 LAB — MRSA PCR SCREENING: MRSA by PCR: NEGATIVE

## 2017-05-06 MED ORDER — INSULIN ASPART 100 UNIT/ML ~~LOC~~ SOLN
0.0000 [IU] | Freq: Every day | SUBCUTANEOUS | Status: DC
  Administered 2017-05-06: 3 [IU] via SUBCUTANEOUS
  Administered 2017-05-06 – 2017-05-07 (×2): 2 [IU] via SUBCUTANEOUS
  Administered 2017-05-09: 3 [IU] via SUBCUTANEOUS
  Administered 2017-05-11: 2 [IU] via SUBCUTANEOUS

## 2017-05-06 MED ORDER — ENOXAPARIN SODIUM 40 MG/0.4ML ~~LOC~~ SOLN
40.0000 mg | Freq: Every day | SUBCUTANEOUS | Status: DC
  Administered 2017-05-06 – 2017-05-11 (×7): 40 mg via SUBCUTANEOUS
  Filled 2017-05-06 (×7): qty 0.4

## 2017-05-06 MED ORDER — SPIRONOLACTONE 25 MG PO TABS
100.0000 mg | ORAL_TABLET | Freq: Every day | ORAL | Status: DC
  Administered 2017-05-06: 100 mg via ORAL
  Filled 2017-05-06: qty 4

## 2017-05-06 MED ORDER — ACETAMINOPHEN 325 MG PO TABS: 650.0000 mg | ORAL_TABLET | Freq: Four times a day (QID) | ORAL | Status: DC | PRN

## 2017-05-06 MED ORDER — HYDROCODONE-ACETAMINOPHEN 5-325 MG PO TABS: 1.0000 | ORAL_TABLET | ORAL | Status: DC | PRN

## 2017-05-06 MED ORDER — SALINE SPRAY 0.65 % NA SOLN
1.0000 | NASAL | Status: DC | PRN
  Administered 2017-05-10: 1 via NASAL
  Filled 2017-05-06: qty 44

## 2017-05-06 MED ORDER — INSULIN ASPART 100 UNIT/ML ~~LOC~~ SOLN
0.0000 [IU] | Freq: Three times a day (TID) | SUBCUTANEOUS | Status: DC
  Administered 2017-05-06: 11 [IU] via SUBCUTANEOUS
  Administered 2017-05-07: 4 [IU] via SUBCUTANEOUS
  Administered 2017-05-07: 7 [IU] via SUBCUTANEOUS
  Administered 2017-05-07 – 2017-05-08 (×2): 4 [IU] via SUBCUTANEOUS
  Administered 2017-05-08 – 2017-05-09 (×3): 7 [IU] via SUBCUTANEOUS
  Administered 2017-05-09: 3 [IU] via SUBCUTANEOUS
  Administered 2017-05-09 – 2017-05-10 (×2): 4 [IU] via SUBCUTANEOUS
  Administered 2017-05-10: 7 [IU] via SUBCUTANEOUS
  Administered 2017-05-10: 4 [IU] via SUBCUTANEOUS
  Administered 2017-05-11 – 2017-05-12 (×3): 3 [IU] via SUBCUTANEOUS
  Administered 2017-05-12: 11 [IU] via SUBCUTANEOUS

## 2017-05-06 MED ORDER — SENNOSIDES-DOCUSATE SODIUM 8.6-50 MG PO TABS: 1.0000 | ORAL_TABLET | Freq: Every evening | ORAL | Status: DC | PRN

## 2017-05-06 MED ORDER — SODIUM CHLORIDE 0.9% FLUSH: 3.0000 mL | INTRAVENOUS | Status: DC | PRN

## 2017-05-06 MED ORDER — RIFAXIMIN 550 MG PO TABS
550.0000 mg | ORAL_TABLET | Freq: Two times a day (BID) | ORAL | Status: DC
  Administered 2017-05-06 – 2017-05-12 (×13): 550 mg via ORAL
  Filled 2017-05-06 (×15): qty 1

## 2017-05-06 MED ORDER — POLYVINYL ALCOHOL 1.4 % OP SOLN
1.0000 [drp] | OPHTHALMIC | Status: DC | PRN
  Filled 2017-05-06: qty 15

## 2017-05-06 MED ORDER — UMECLIDINIUM BROMIDE 62.5 MCG/INH IN AEPB
1.0000 | INHALATION_SPRAY | Freq: Every day | RESPIRATORY_TRACT | Status: DC
  Administered 2017-05-06 – 2017-05-12 (×7): 1 via RESPIRATORY_TRACT
  Filled 2017-05-06: qty 7

## 2017-05-06 MED ORDER — ALBUTEROL SULFATE (2.5 MG/3ML) 0.083% IN NEBU: 2.5000 mg | INHALATION_SOLUTION | RESPIRATORY_TRACT | Status: DC | PRN

## 2017-05-06 MED ORDER — SODIUM CHLORIDE 0.9% FLUSH
3.0000 mL | Freq: Two times a day (BID) | INTRAVENOUS | Status: DC
  Administered 2017-05-06 – 2017-05-10 (×9): 3 mL via INTRAVENOUS

## 2017-05-06 MED ORDER — ONDANSETRON HCL 4 MG PO TABS: 4.0000 mg | ORAL_TABLET | Freq: Four times a day (QID) | ORAL | Status: DC | PRN

## 2017-05-06 MED ORDER — BISACODYL 5 MG PO TBEC: 5.0000 mg | DELAYED_RELEASE_TABLET | Freq: Every day | ORAL | Status: DC | PRN

## 2017-05-06 MED ORDER — ONDANSETRON HCL 4 MG/2ML IJ SOLN: 4.0000 mg | Freq: Four times a day (QID) | INTRAMUSCULAR | Status: DC | PRN

## 2017-05-06 MED ORDER — SODIUM CHLORIDE 0.9 % IV SOLN
INTRAVENOUS | Status: DC
  Administered 2017-05-06: 14:00:00 via INTRAVENOUS

## 2017-05-06 MED ORDER — LACTULOSE 10 GM/15ML PO SOLN
10.0000 g | Freq: Two times a day (BID) | ORAL | Status: DC
  Administered 2017-05-06 – 2017-05-07 (×2): 10 g via ORAL
  Filled 2017-05-06 (×3): qty 15

## 2017-05-06 MED ORDER — ORAL CARE MOUTH RINSE
15.0000 mL | Freq: Two times a day (BID) | OROMUCOSAL | Status: DC
  Administered 2017-05-07 – 2017-05-12 (×10): 15 mL via OROMUCOSAL

## 2017-05-06 MED ORDER — SODIUM CHLORIDE 0.9 % IV SOLN: 250.0000 mL | INTRAVENOUS | Status: DC | PRN

## 2017-05-06 MED ORDER — INSULIN ASPART 100 UNIT/ML ~~LOC~~ SOLN
0.0000 [IU] | Freq: Three times a day (TID) | SUBCUTANEOUS | Status: DC
  Administered 2017-05-06: 5 [IU] via SUBCUTANEOUS
  Administered 2017-05-06: 11 [IU] via SUBCUTANEOUS

## 2017-05-06 MED ORDER — DEXTROSE 5 % IV SOLN
500.0000 mg | Freq: Every day | INTRAVENOUS | Status: DC
  Administered 2017-05-06 – 2017-05-07 (×3): 500 mg via INTRAVENOUS
  Filled 2017-05-06 (×4): qty 500

## 2017-05-06 MED ORDER — METHYLPREDNISOLONE SODIUM SUCC 125 MG IJ SOLR
60.0000 mg | Freq: Four times a day (QID) | INTRAMUSCULAR | Status: DC
  Administered 2017-05-06 (×5): 60 mg via INTRAVENOUS
  Administered 2017-05-07: 12:00:00 via INTRAVENOUS
  Administered 2017-05-07: 60 mg via INTRAVENOUS
  Filled 2017-05-06 (×7): qty 2

## 2017-05-06 MED ORDER — ACETAMINOPHEN 650 MG RE SUPP: 650.0000 mg | Freq: Four times a day (QID) | RECTAL | Status: DC | PRN

## 2017-05-06 MED ORDER — INSULIN ASPART 100 UNIT/ML ~~LOC~~ SOLN
3.0000 [IU] | Freq: Three times a day (TID) | SUBCUTANEOUS | Status: DC
  Administered 2017-05-06 – 2017-05-07 (×3): 3 [IU] via SUBCUTANEOUS

## 2017-05-06 MED ORDER — VITAMIN D 1000 UNITS PO TABS
2000.0000 [IU] | ORAL_TABLET | Freq: Every morning | ORAL | Status: DC
  Administered 2017-05-06 – 2017-05-12 (×7): 2000 [IU] via ORAL
  Filled 2017-05-06 (×7): qty 2

## 2017-05-06 MED ORDER — FUROSEMIDE 20 MG PO TABS
20.0000 mg | ORAL_TABLET | Freq: Two times a day (BID) | ORAL | Status: DC
  Administered 2017-05-06: 20 mg via ORAL
  Filled 2017-05-06: qty 1

## 2017-05-06 MED ORDER — SODIUM CHLORIDE 0.9% FLUSH
3.0000 mL | Freq: Two times a day (BID) | INTRAVENOUS | Status: DC
  Administered 2017-05-06 – 2017-05-11 (×5): 3 mL via INTRAVENOUS

## 2017-05-06 MED ORDER — ARFORMOTEROL TARTRATE 15 MCG/2ML IN NEBU
15.0000 ug | INHALATION_SOLUTION | Freq: Two times a day (BID) | RESPIRATORY_TRACT | Status: DC
  Administered 2017-05-06 – 2017-05-12 (×13): 15 ug via RESPIRATORY_TRACT
  Filled 2017-05-06 (×12): qty 2

## 2017-05-06 MED ORDER — INSULIN GLARGINE 100 UNIT/ML ~~LOC~~ SOLN
14.0000 [IU] | Freq: Every day | SUBCUTANEOUS | Status: DC
  Administered 2017-05-06 – 2017-05-11 (×7): 14 [IU] via SUBCUTANEOUS
  Filled 2017-05-06 (×8): qty 0.14

## 2017-05-06 NOTE — Evaluation (Signed)
Occupational Therapy Evaluation Patient Details Name: Ashley Savage MRN: [id number] DOB: November 20, 1940 Today's Date: 05/06/2017    History of Present Illness 76 year old female was admitted for COPD exacerbation.  PMH:  DM, cirrhosis, breast CA, and chronic leg edema   Clinical Impression   Pt was admitted for the above.  She needs min guard to ambulate and up to mod A for LB adls due to desaturation/increased RR.  Pt needed extended time to recover and 02 was bumped up.  Will follow in acute setting with supervision level goals    Follow Up Recommendations  SNF    Equipment Recommendations   (possibly 3:1 commode)    Recommendations for Other Services       Precautions / Restrictions Precautions Precautions: Fall Restrictions Weight Bearing Restrictions: No      Mobility Bed Mobility Overal bed mobility: Modified Independent                Transfers Overall transfer level: Needs assistance Equipment used: Rolling walker (2 wheeled) Transfers: Sit to/from Stand Sit to Stand: Min guard         General transfer comment: for safety. Cues for UE placement    Balance                                           ADL either performed or assessed with clinical judgement   ADL Overall ADL's : Needs assistance/impaired Eating/Feeding: Independent   Grooming: Set up;Sitting   Upper Body Bathing: Set up;Sitting   Lower Body Bathing: Minimal assistance;Sit to/from stand   Upper Body Dressing : Set up;Sitting   Lower Body Dressing: Moderate assistance;Sit to/from stand   Toilet Transfer: Min guard;Ambulation;BSC;RW   Toileting- Water quality scientist and Hygiene: Min guard;Sit to/from stand         General ADL Comments: Pt desats quickly affecting adls: needs time to recover and cues for pursed lip breathing     Vision         Perception     Praxis      Pertinent Vitals/Pain       Hand Dominance Right   Extremity/Trunk  Assessment Upper Extremity Assessment Upper Extremity Assessment: Overall WFL for tasks assessed           Communication Communication Communication: No difficulties   Cognition Arousal/Alertness: Awake/alert Behavior During Therapy: WFL for tasks assessed/performed Overall Cognitive Status: Within Functional Limits for tasks assessed                                     General Comments   on 4 liters, sats 94%. 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        OT Problem List: Decreased strength;Decreased activity tolerance;Impaired balance (sitting and/or standing);Pain;Decreased knowledge of use of DME or AE;Cardiopulmonary status limiting activity      OT Treatment/Interventions: Self-care/ADL training;DME and/or AE instruction;Patient/family education;Balance training;Therapeutic activities    OT Goals(Current goals can be found in the care plan section) Acute Rehab OT Goals Patient Stated Goal: home OT Goal Formulation: With patient Time For Goal Achievement: 05/13/17 Potential to Achieve Goals: Good ADL Goals Pt Will Transfer to Toilet: with supervision;bedside commode;ambulating;regular height toilet (vs) Pt Will Perform Toileting - Clothing Manipulation and hygiene: with supervision;sitting/lateral leans Additional ADL Goal #1: pt will perform adl  with set up/supervison sit to stand Additional ADL Goal #2: pt will initiate at least one rest break for energy conservation without cues  OT Frequency: Min 2X/week   Barriers to D/C:            Co-evaluation PT/OT/SLP Co-Evaluation/Treatment: Yes Reason for Co-Treatment: For patient/therapist safety PT goals addressed during session: Mobility/safety with mobility OT goals addressed during session: ADL's and self-care      AM-PAC PT "6 Clicks" Daily Activity     Outcome Measure Help from another person eating meals?: None Help from another person taking care of personal grooming?: A Little Help from another person toileting, which includes using toliet, bedpan, or urinal?: A Little Help from another person bathing (including washing, rinsing, drying)?: A Little Help from another person to put on and taking off regular upper body clothing?: A Little Help from another person to put on and taking off regular lower body clothing?: A Lot 6 Click Score: 18   End of Session    Activity Tolerance: Patient limited by fatigue Patient left: in bed;with call bell/phone within reach;with bed alarm set  OT Visit Diagnosis: Unsteadiness on feet (R26.81);Muscle weakness (generalized) (M62.81)                Time: 8882-8003 OT Time Calculation (min): 36 min Charges:  OT General Charges $OT Visit: 1 Visit OT Evaluation $OT Eval Low Complexity: 1 Low G-Codes:     [place], OTR/L [phone] 05/06/2017  Ashley Savage 05/06/2017, 2:16 PM

## 2017-05-06 NOTE — Progress Notes (Signed)
Nutrition Brief Note  Patient identified for consult per COPD Gold Protocol.   Wt Readings from Last 15 Encounters:  05/05/17 145 lb (65.8 kg)  04/13/17 150 lb 12.8 oz (68.4 kg)  03/30/17 154 lb 12.8 oz (70.2 kg)  03/24/17 144 lb (65.3 kg)  03/11/17 148 lb (67.1 kg)  02/19/17 148 lb (67.1 kg)  02/09/17 145 lb (65.8 kg)  01/17/17 140 lb (63.5 kg)  01/07/17 141 lb 12.8 oz (64.3 kg)  01/05/17 140 lb (63.5 kg)  01/03/17 145 lb (65.8 kg)  12/10/16 151 lb (68.5 kg)  12/10/16 145 lb (65.8 kg)  12/07/16 145 lb (65.8 kg)  12/03/16 145 lb (65.8 kg)    Body mass index is 23.4 kg/m. Patient meets criteria for normal weight based on current BMI. Skin WDL.   Pt with hx of cirrhosis 2/2 NASH, COPD with ongoing tobacco abuse, DM. Pt admitted for COPD with acute exacerbation. Current diet order is Heart Healthy/Carb Modified with 1.2 L fluid restriction.  Medications reviewed; 2000 units vitamin D/day, 20 mg oral Lasix BID, sliding scale Novolog, 14 units Lantus/day, 10 mg lactulose BID, 125 mg Solu-medrol x1 dose yesterday, 60 mg Solu-medrol QID, 100 mg Aldactone/day.  Labs reviewed; BUN: 24 mg/dL, creatinine: 1.23 mg/dL, Ca: 8.6 mg/dL, alk Phos elevated but trending down, AST elevated, GFR: 42 mL/min. HgbA1c: 5.8% in July 2018.     No nutrition interventions warranted at this time. If nutrition issues arise, please consult RD.     Jarome Matin, MS, RD, LDN, [facility] In[facility] Clinical Dietitian Pager # [phone] After hours/weekend pager # [phone]

## 2017-05-06 NOTE — Evaluation (Signed)
Physical Therapy Evaluation Patient Details Name: Ashley Savage MRN: [id number] DOB: October 11, 1940 Today's Date: 05/06/2017   History of Present Illness  76 year old female was admitted for COPD exacerbation.  PMH:  DM, cirrhosis, breast CA, and chronic leg edema  Clinical Impression  On eval, pt was Min guard assist for mobility. She walked ~55 feet with a RW. O2 sat 93% on 4L [place] O2 at rest, 87% on 6L [place] O2 during ambulation. Multiple rest breaks taken during session. Discussed d/c plan-pt prefers to d/c home. Will follow and progress activity as tolerated.     Follow Up Recommendations SNF (if pt is agreeable. If pt returns home then HHPT and 24 hour supervision/assist)    Equipment Recommendations  None recommended by PT    Recommendations for Other Services       Precautions / Restrictions Precautions Precautions: Fall Precaution Comments: monitor O2 sat Restrictions Weight Bearing Restrictions: No      Mobility  Bed Mobility Overal bed mobility: Modified Independent                Transfers Overall transfer level: Needs assistance Equipment used: Rolling walker (2 wheeled) Transfers: Sit to/from Stand Sit to Stand: Min guard         General transfer comment: for safety. Cues for UE placement.   Ambulation/Gait Ambulation/Gait assistance: Min guard Ambulation Distance (Feet): 55 Feet Assistive device: Rolling walker (2 wheeled) Gait Pattern/deviations: Step-through pattern;Decreased stride length     General Gait Details: Close guard for safety. Ambulated on 6L [place] O2-87% SPO2. 1 standing rest break. Cues for pursed lip breathing/deep breathing.   Stairs            Wheelchair Mobility    Modified Rankin (Stroke Patients Only)       Balance                                             Pertinent Vitals/Pain Pain Assessment: No/denies pain    Home Living Family/patient expects to be discharged to:: Private  residence Living Arrangements: Alone Available Help at Discharge: Friend(s) Type of Home: House Home Access: Stairs to enter     Home Layout: One level Home Equipment: Shower seat;Cane - single point;Walker - 2 wheels;Walker - 4 wheels Additional Comments: counter next to toilet    Prior Function Level of Independence: Independent         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     Hand Dominance   Dominant Hand: Right    Extremity/Trunk Assessment   Upper Extremity Assessment Upper Extremity Assessment: Defer to OT evaluation    Lower Extremity Assessment Lower Extremity Assessment: Generalized weakness    Cervical / Trunk Assessment Cervical / Trunk Assessment: Normal  Communication   Communication: No difficulties  Cognition Arousal/Alertness: Awake/alert Behavior During Therapy: WFL for tasks assessed/performed Overall Cognitive Status: Within Functional Limits for tasks assessed                                        General Comments      Exercises     Assessment/Plan    PT Assessment Patient needs continued PT services  PT Problem List Decreased strength;Decreased mobility;Decreased activity tolerance;Decreased balance;Decreased knowledge of use of DME;Cardiopulmonary status limiting activity  PT Treatment Interventions DME instruction;Gait training;Therapeutic activities;Therapeutic exercise;Patient/family education;Functional mobility training;Balance training    PT Goals (Current goals can be found in the Care Plan section)  Acute Rehab PT Goals Patient Stated Goal: home PT Goal Formulation: With patient Time For Goal Achievement: 05/20/17 Potential to Achieve Goals: Good    Frequency Min 3X/week   Barriers to discharge        Co-evaluation   Reason for Co-Treatment: For patient/therapist safety PT goals addressed during session: Mobility/safety with mobility OT goals addressed during session: ADL's and self-care        AM-PAC PT "6 Clicks" Daily Activity  Outcome Measure Difficulty turning over in bed (including adjusting bedclothes, sheets and blankets)?: A Little Difficulty moving from lying on back to sitting on the side of the bed? : A Little Difficulty sitting down on and standing up from a chair with arms (e.g., wheelchair, bedside commode, etc,.)?: A Little Help needed moving to and from a bed to chair (including a wheelchair)?: A Little Help needed walking in hospital room?: A Little Help needed climbing 3-5 steps with a railing? : A Little 6 Click Score: 18    End of Session Equipment Utilized During Treatment: Oxygen Activity Tolerance:  (Limited by dyspnea, low O2 sats) Patient left: in bed;with call bell/phone within reach;with bed alarm set   PT Visit Diagnosis: Muscle weakness (generalized) (M62.81);Difficulty in walking, not elsewhere classified (R26.2)    Time: 6967-8938 PT Time Calculation (min) (ACUTE ONLY): 39 min   Charges:   PT Evaluation $PT Eval Low Complexity: 1 Low     PT G Codes:          Weston Anna, MPT Pager: [phone]

## 2017-05-06 NOTE — H&P (Signed)
History and Physical    Ashley Savage LKG:[id number] DOB: 06-03-1941 DOA: 05/05/2017  PCP: Mosie Lukes, MD   Patient coming from: Home, by way of [facility]  Chief Complaint: DOE   HPI: Ashley Savage is a 76 y.o. female with medical history significant for cirrhosis secondary to NASH, COPD with ongoing tobacco abuse,and insulin-dependent diabetes mellitus, now presenting to the emergency department for evaluation of dyspnea with minimal exertion. Patient reported that the symptoms developed approximately 4 days earlier and have continued to worsen despite her use of inhalers at home. She called her pulmonologist clinic and was given a short follow-up, but the appointment was not until tomorrow and she did not feel as though she can make a, and decided to present to the emergency department tonight. Patient believes this may been precipitated by seasonal allergies, explaining that she usually starts Zyrtec this time of year, but had not started taking yet and had spent the day outdoors just prior to onset of symptoms. She reports trying to cut back on her smoking. She does not use oxygen at home.  [facility] ED Course: Upon arrival to the ED, patient is found to be febrile, saturating 87% on room air, tachypneic in the high 20s, and with vitals otherwise stable. EKG features a sinus rhythm with PAC and chest x-ray is notable for COPD, but no acute Pulmonary disease. Chemstry panel reveals glucose of 181 and a mild elevation in LFTs. CBC is notable for a stable thrombocytopenia with platelets 110,000. Ammonia is slightly elevated to 47. atient was started on BiPAP, treated with multiple nebs, and given 125 mg of IV Solu-Medrol in the ED. She improved somewhat, but continued to be in acute distress and will be admitted to the stepdown unit at [facility] for ongoing valuation and managementdaily exacerbation and COPD with acute hypoxic respiratory failure.  Review of Systems:  All other  systems reviewed and apart from HPI, are negative.  Past Medical History:  Diagnosis Date  . Allergic state 11/10/2016  . Anxiety   . Arthritis of both knees 10/01/2013  . Benign paroxysmal positional vertigo 10/01/2013  . Cancer [facility]) breast ca  right  . COPD (chronic obstructive pulmonary disease) ([place]) 10/01/2013  . Depression   . Dermatitis 03/30/2017  . Diabetes mellitus type 2  . Emphysema   . Encephalopathy, hepatic ([place]) 06/07/2014  . Esophageal reflux 10/01/2013  . Fall 07/30/2016  . Hyperlipidemia   . Hyperlipidemia, mixed   . Increased ammonia level 11/25/2014  . NASH (nonalcoholic steatohepatitis) 08/26/2015  . Neck pain 10/01/2013  . Neuropathy    feet   . Osteopenia 03/30/2017  . Overactive bladder 12/10/2013  . Panic attacks   . Pedal edema 12/10/2013  . Personal history of radiation therapy   . Preventative health care 03/08/2016  . Tobacco abuse disorder 02/01/2014    Past Surgical History:  Procedure Laterality Date  . APPENDECTOMY  2007  . BREAST SURGERY  2009 right  . CATARACT EXTRACTION     x 2  . ESOPHAGOGASTRODUODENOSCOPY (EGD) WITH PROPOFOL N/A 08/14/2016   Procedure: ESOPHAGOGASTRODUODENOSCOPY (EGD) WITH PROPOFOL;  Surgeon: Mauri Pole, MD;  Location: [facility] ENDOSCOPY;  Service: Endoscopy;  Laterality: N/A;  . [place]  . KNEE SURGERY    . MANDIBLE FRACTURE SURGERY    . MASTECTOMY    . PILONIDAL CYST EXCISION    . TONSILLECTOMY       reports that she has been smoking Cigarettes.  She started smoking about 52 years ago. She has a 14.50 pack-year smoking history. She has never used smokeless tobacco. She reports that she does not drink alcohol or use drugs.  Allergies  Allergen Reactions  . Citalopram Palpitations    Irregular heart beat  . Ciprofloxacin     Mental status change  . Erythromycin Other (See Comments)    Stomach cramps  . Glimepiride     Elevated ammonia levels  . Prednisone     Increased blood sugars too high  . Versed  [Midazolam] Other (See Comments)    Patient stayed confusion stayed 4+days     Family History  Problem Relation Age of Onset  . Heart failure Father   . COPD Father   . Arthritis Father 32  . Stroke Mother   . Arthritis Mother 34  . Hyperlipidemia Mother   . Hypertension Mother   . Diabetes Mother   . Diabetes Sister   . Breast cancer Unknown   . Breast cancer Maternal Aunt   . Asthma Maternal Aunt   . Birth defects Maternal Aunt   . Alcohol abuse Maternal Uncle   . Breast cancer Maternal Aunt      Prior to Admission medications   Medication Sig Start Date End Date Taking? Authorizing Provider  albuterol (ACCUNEB) 1.25 MG/3ML nebulizer solution USE 1 VIAL VIA NEBULIZER EVERY 6 HOURS AS NEEDED FOR WHEEZING 03/22/17   Rigoberto Noel, MD  albuterol (PROVENTIL HFA;VENTOLIN HFA) 108 (90 Base) MCG/ACT inhaler Inhale 2 puffs into the lungs every 6 (six) hours as needed for wheezing or shortness of breath. Only dispense Ventolin 02/25/16   Mosie Lukes, MD  amoxicillin-clavulanate (AUGMENTIN) 875-125 MG tablet Take 1 tablet by mouth 2 (two) times daily. 04/13/17   Mosie Lukes, MD  BD PEN NEEDLE NANO U/F 32G X 4 MM MISC USE AS DIRECTED WITH LEVEMIR FLEXPEN 08/17/16   Mosie Lukes, MD  Blood Glucose Monitoring Suppl (ONE TOUCH ULTRA 2) w/Device KIT Use as directed once daily to check blood sugar.  DX E11.9 09/14/16   Mosie Lukes, MD  Cholecalciferol (VITAMIN D3) 2000 units TABS Take 2,000 Units by mouth every morning.    [provider]  furosemide (LASIX) 20 MG tablet TAKE 1 TABLET BY MOUTH TWICE DAILY 04/15/17   Nandigam, Karleen Hampshire V, MD  glucose blood (ONE TOUCH ULTRA TEST) test strip CHECK BLOOD SUGAR TWICE DAILY AS DIRECTED 02/09/17   Mosie Lukes, MD  ibuprofen (ADVIL,MOTRIN) 200 MG tablet Take 200 mg by mouth every 6 (six) hours as needed for mild pain.    [provider]  lactulose (CHRONULAC) 10 GM/15ML solution TAKE 45ML BY MOUTH TWICE DAILY AS NEEDED FOR  MILD CONSTIPATION 03/19/17   Mosie Lukes, MD  LEVEMIR FLEXTOUCH 100 UNIT/ML Pen INJECT 14 UNITS UNDER THE SKIN EVERY DAY AT 10PM 04/08/17   Mosie Lukes, MD  mometasone (NASONEX) 50 MCG/ACT nasal spray Place 2 sprays into the nose daily. 03/30/17   Mosie Lukes, MD  mupirocin ointment (BACTROBAN) 2 % Place on application over lesion on leg twice daily. 01/08/17   Shelda Pal, DO  omeprazole (PRILOSEC) 20 MG capsule TAKE 1 CAPSULE(20 MG) BY MOUTH DAILY 01/26/17   Mosie Lukes, MD  Polyvinyl Alcohol (LUBRICANT DROPS OP) Apply 1 drop to eye daily as needed (dry eyes).    [provider]  rifaximin (XIFAXAN) 550 MG TABS tablet Take 1 tablet (550 mg total) by mouth 2 (  two) times daily. 09/04/16   Esterwood, Amy S, PA-C  spironolactone (ALDACTONE) 50 MG tablet TAKE 2 TABLETS(100 MG) BY MOUTH DAILY 05/05/17   Nandigam, Venia Minks, MD  Tiotropium Bromide-Olodaterol (STIOLTO RESPIMAT) 2.5-2.5 MCG/ACT AERS Inhale 2 puffs into the lungs daily. 03/15/17   Rigoberto Noel, MD    Physical Exam: Vitals:   05/05/17 2318 05/05/17 2346 05/05/17 2352 05/06/17 0055  BP:  (!) 130/51  (!) 112/44  Pulse: 97 100  97  Resp: (!) 22 (!) 21    Temp:   (!) 97.5 F (36.4 C)   TempSrc:   Oral   SpO2: 93% 92%    Weight:      Height:          Constitutional: Dyspneic with speech, calm Eyes: PERTLA, lids and conjunctivae normal ENMT: Mucous membranes are moist. Posterior pharynx clear of any exudate or lesions.   Neck: normal, supple, no masses, no thyromegaly Respiratory: Diminished bilaterally with expiratory wheezes and prolonged expiratory phase. Dyspneic with speech, mild tachypnea. No accessory muscle use.  Cardiovascular: S1 & S2 heard, regular rate and rhythm. 2+ pretibial edema bilaterally. No significant JVD. Abdomen: No distension, no tenderness, no masses palpated. Bowel sounds normal.  Musculoskeletal: no clubbing / cyanosis. No joint deformity upper and lower extremities.      Skin: no significant rashes, lesions, ulcers. Warm, dry, well-perfused. Neurologic: CN 2-12 grossly intact. Sensation intact. Strength 5/5 in all 4 limbs.  Psychiatric: Alert and oriented x 3. Calm, cooperative.     Labs on Admission: I have personally reviewed following labs and imaging studies  CBC:  Recent Labs Lab 05/05/17 1853  WBC 6.4  NEUTROABS 3.3  HGB 14.4  HCT 41.6  MCV 93.7  PLT 643*   Basic Metabolic Panel:  Recent Labs Lab 05/05/17 1853  NA 137  K 4.9  CL 102  CO2 29  GLUCOSE 181*  BUN 18  CREATININE 0.95  CALCIUM 9.2   GFR: Estimated Creatinine Clearance: 48.6 mL/min (by C-G formula based on SCr of 0.95 mg/dL). Liver Function Tests:  Recent Labs Lab 05/05/17 1853  AST 57*  ALT 33  ALKPHOS 157*  BILITOT 1.9*  PROT 5.9*  ALBUMIN 2.8*   No results for input(s): LIPASE, AMYLASE in the last 168 hours.  Recent Labs Lab 05/05/17 1855  AMMONIA 47*   Coagulation Profile: No results for input(s): INR, PROTIME in the last 168 hours. Cardiac Enzymes:  Recent Labs Lab 05/05/17 1853  TROPONINI <0.03   BNP (last 3 results) No results for input(s): PROBNP in the last 8760 hours. HbA1C: No results for input(s): HGBA1C in the last 72 hours. CBG: No results for input(s): GLUCAP in the last 168 hours. Lipid Profile: No results for input(s): CHOL, HDL, LDLCALC, TRIG, CHOLHDL, LDLDIRECT in the last 72 hours. Thyroid Function Tests: No results for input(s): TSH, T4TOTAL, FREET4, T3FREE, THYROIDAB in the last 72 hours. Anemia Panel: No results for input(s): VITAMINB12, FOLATE, FERRITIN, TIBC, IRON, RETICCTPCT in the last 72 hours. Urine analysis:    Component Value Date/Time   COLORURINE YELLOW 04/14/2017 1108   APPEARANCEUR CLEAR 04/14/2017 1108   LABSPEC >=1.030 (A) 04/14/2017 1108   PHURINE 5.5 04/14/2017 1108   GLUCOSEU 250 (A) 04/14/2017 1108   HGBUR NEGATIVE 04/14/2017 1108   BILIRUBINUR NEGATIVE 04/14/2017 1108   BILIRUBINUR neg  12/02/2016 1508   KETONESUR TRACE (A) 04/14/2017 1108   PROTEINUR NEGATIVE 03/11/2017 1250   UROBILINOGEN 0.2 04/14/2017 1108   NITRITE POSITIVE (A) 04/14/2017  [place] 04/14/2017 1108   Sepsis Labs: '@LABRCNTIP' (procalcitonin:4,lacticidven:4) )No results found for this or any previous visit (from the past 240 hour(s)).   Radiological Exams on Admission: Dg Chest Port 1 View  Result Date: 05/05/2017 CLINICAL DATA:  Shortness of Breath EXAM: PORTABLE CHEST 1 VIEW COMPARISON:  01/17/2017 FINDINGS: There is hyperinflation of the lungs compatible with COPD. Heart and mediastinal contours are within normal limits. No focal opacities or effusions. No acute bony abnormality. IMPRESSION: COPD.  No active disease. Electronically Signed   By: Rolm Baptise M.D.   On: 05/05/2017 19:41    EKG: Independently reviewed. Sinus rhythm, PAC.   Assessment/Plan  1. COPD exacerbation, acute hypoxic respiratory failure  - Pt presents with dyspnea and is found to be hypoxic and in acute distress  - She improved in ED with multiple nebs, 125 mg IV Solu-Medrol, and BiPAP  - Plan to continue BiPAP prn, check sputum culture and continue systemic steroid, continue scheduled inhalers and prn albuterol nebs, start azithromycin    2. Cirrhosis  - Attributed to NASH  - Continue diuretics, lactulose, and rifaximin   3. Insulin-dependent DM  - A1c was 5.8% in July '18  - She will be on systemic steroid as above  - Managed at home with Levemir 14 units qHS  - Check CBG with meals and qHS  - Continue 14 units basal insulin qHS with a moderate-intensity SSI    4. Thrombocytopenia  - Platelets 111k on admission - Stable relative to priors, likely secondary to cirrhosis, and with no bleeding     DVT prophylaxis: Lovenox Code Status: Full  Family Communication: Discussed with patient Disposition Plan: Admit to SDU Consults called: None Admission status: Inpatient    Vianne Bulls,  MD [facility] Pager [phone]  If 7PM-7AM, please contact night-coverage [email or web address] Password [email or web address]  05/06/2017, 12:58 AM

## 2017-05-06 NOTE — Progress Notes (Signed)
PROGRESS NOTE    Ashley Savage  ERX:[id number] DOB: September 13, 1940 DOA: 05/05/2017 PCP: Mosie Lukes, MD    Brief Narrative: Ashley Savage is a 76 y.o. female with medical history significant for cirrhosis secondary to NASH, COPD with ongoing tobacco abuse,and insulin-dependent diabetes mellitus, now presenting to the emergency department for evaluation of dyspnea with minimal exertion. She was admitted for COPD exacerbation.   Assessment & Plan:   Principal Problem:   COPD with acute exacerbation ([place]) Active Problems:   Thrombocytopenia (H[facility])   Anxiety and depression   Tobacco abuse   Diabetes mellitus type 2, controlled ([place])   Cirrhosis of liver without ascites (HCC)   Acute respiratory failure with hypoxia (H[facility])   NASH (nonalcoholic steatohepatitis)   Acute on chronic respiratory failure with hypoxia:  Secondary to acute copd exacerbation.  She initially required BIPAP, IV solumedrol , duonebs and azithromycin.  She is transitioned to [place] oxygen, and keep sats around 90%.     Cirrhosis secondary to NASH.    Insulin dependent DM:  - CBG (last 3)   Recent Labs  05/06/17 0322 05/06/17 0759 05/06/17 1138  GLUCAP 267* 310* 249*    Add novolog 3 units TIDAC, change to resistant scale SSI.  Resume 14 units of Lantus.     Thrombocytopenia:  Possibly from cirrhosis. No signs of bleeding.    Acute kidney injury:  Suspect from dehydration.  Gentle hydration.        DVT prophylaxis:lovenox.  Code Status: full code.  Family Communication: none at bedside.  Disposition Plan: pending resolution of respiratory issues.   Consultants:   None.    Procedures: none.   Antimicrobials: azithromycin. Since admission.   Subjective: Breathing slightly better than yesterday.   Objective: Vitals:   05/06/17 0600 05/06/17 0800 05/06/17 1050 05/06/17 1155  BP: (!) 129/45 (!) 104/43    Pulse: 88 87    Resp: 20 (!) 27    Temp:  97.7 F (36.5 C)  97.9 F  (36.6 C)  TempSrc:  Oral  Oral  SpO2: 100% 95% 94%   Weight:      Height:        Intake/Output Summary (Last 24 hours) at 05/06/17 1305 Last data filed at 05/06/17 0600  Gross per 24 hour  Intake              313 ml  Output              200 ml  Net              113 ml   Filed Weights   05/05/17 1838  Weight: 65.8 kg (145 lb)    Examination:  General exam: Appears calm and comfortable  Respiratory system:DIFFUSE wheezing bilateral. Fair entry bilateral.  Cardiovascular system: S1 & S2 heard, RRR. No JVD, murmurs, rubs, gallops or clicks. No pedal edema. Gastrointestinal system: Abdomen is nondistended, soft and nontender. No organomegaly or masses felt. Normal bowel sounds heard. Central nervous system: Alert and oriented. No focal neurological deficits. Extremities: Symmetric 5 x 5 power. Skin: No rashes, lesions or ulcers Psychiatry: Judgement and insight appear normal. Mood & affect appropriate.     Data Reviewed: I have personally reviewed following labs and imaging studies  CBC:  Recent Labs Lab 05/05/17 1853 05/06/17 0332  WBC 6.4 5.5  NEUTROABS 3.3 4.8  HGB 14.4 13.5  HCT 41.6 39.4  MCV 93.7 95.2  PLT 110* 950*   Basic Metabolic Panel:  Recent  Labs Lab 05/05/17 1853 05/06/17 0332  NA 137 135  K 4.9 4.9  CL 102 101  CO2 29 22  GLUCOSE 181* 307*  BUN 18 24*  CREATININE 0.95 1.23*  CALCIUM 9.2 8.6*   GFR: Estimated Creatinine Clearance: 37.6 mL/min (A) (by C-G formula based on SCr of 1.23 mg/dL (H)). Liver Function Tests:  Recent Labs Lab 05/05/17 1853 05/06/17 0332  AST 57* 52*  ALT 33 30  ALKPHOS 157* 149*  BILITOT 1.9* 1.7*  PROT 5.9* 5.4*  ALBUMIN 2.8* 2.4*   No results for input(s): LIPASE, AMYLASE in the last 168 hours.  Recent Labs Lab 05/05/17 1855  AMMONIA 47*   Coagulation Profile: No results for input(s): INR, PROTIME in the last 168 hours. Cardiac Enzymes:  Recent Labs Lab 05/05/17 1853  TROPONINI <0.03   BNP  (last 3 results) No results for input(s): PROBNP in the last 8760 hours. HbA1C: No results for input(s): HGBA1C in the last 72 hours. CBG:  Recent Labs Lab 05/06/17 0322 05/06/17 0759 05/06/17 1138  GLUCAP 267* 310* 249*   Lipid Profile: No results for input(s): CHOL, HDL, LDLCALC, TRIG, CHOLHDL, LDLDIRECT in the last 72 hours. Thyroid Function Tests: No results for input(s): TSH, T4TOTAL, FREET4, T3FREE, THYROIDAB in the last 72 hours. Anemia Panel: No results for input(s): VITAMINB12, FOLATE, FERRITIN, TIBC, IRON, RETICCTPCT in the last 72 hours. Sepsis Labs: No results for input(s): PROCALCITON, LATICACIDVEN in the last 168 hours.  Recent Results (from the past 240 hour(s))  MRSA PCR Screening     Status: None   Collection Time: 05/06/17 12:40 AM  Result Value Ref Range Status   MRSA by PCR NEGATIVE NEGATIVE Final    Comment:        The GeneXpert MRSA Assay (FDA approved for NASAL specimens only), is one component of a comprehensive MRSA colonization surveillance program. It is not intended to diagnose MRSA infection nor to guide or monitor treatment for MRSA infections.   Culture, sputum-assessment     Status: None   Collection Time: 05/06/17 10:19 AM  Result Value Ref Range Status   Specimen Description EXPECTORATED SPUTUM  Final   Special Requests NONE  Final   Sputum evaluation THIS SPECIMEN IS ACCEPTABLE FOR SPUTUM CULTURE  Final   Report Status 05/06/2017 FINAL  Final         Radiology Studies: Dg Chest Port 1 View  Result Date: 05/05/2017 CLINICAL DATA:  Shortness of Breath EXAM: PORTABLE CHEST 1 VIEW COMPARISON:  01/17/2017 FINDINGS: There is hyperinflation of the lungs compatible with COPD. Heart and mediastinal contours are within normal limits. No focal opacities or effusions. No acute bony abnormality. IMPRESSION: COPD.  No active disease. Electronically Signed   By: Rolm Baptise M.D.   On: 05/05/2017 19:41        Scheduled Meds: .  arformoterol  15 mcg Nebulization BID  . cholecalciferol  2,000 Units Oral q morning - 10a  . enoxaparin (LOVENOX) injection  40 mg Subcutaneous QHS  . furosemide  20 mg Oral BID  . insulin aspart  0-15 Units Subcutaneous TID WC  . insulin aspart  0-5 Units Subcutaneous QHS  . insulin glargine  14 Units Subcutaneous QHS  . lactulose  10 g Oral BID  . methylPREDNISolone (SOLU-MEDROL) injection  60 mg Intravenous Q6H  . rifaximin  550 mg Oral BID  . sodium chloride flush  3 mL Intravenous Q12H  . sodium chloride flush  3 mL Intravenous Q12H  . spironolactone  100 mg Oral Daily  . umeclidinium bromide  1 puff Inhalation Daily   Continuous Infusions: . sodium chloride    . azithromycin Stopped (05/06/17 0529)     LOS: 1 day    Time spent: [place], MD [facility] Pager [phone]  If 7PM-7AM, please contact night-coverage [email or web address] Password [email or web address] 05/06/2017, 1:05 PM

## 2017-05-06 NOTE — Care Management Note (Signed)
Case Management Note  Patient Details  Name: Ashley Savage MRN: [id number] Date of Birth: 09/29/1940  Subjective/Objective:                  Cirrhosis and hypoxia  Action/Plan: Date:  May 06, 2017 Chart reviewed for concurrent status and case management needs.  Will continue to follow patient progress.  Discharge Planning: following for needs  Expected discharge date: [id number]  [name], BSN, [name], [name]   Expected Discharge Date:                  Expected Discharge Plan:  Home/Self Care  In-House Referral:     Discharge planning Services  CM Consult  Post Acute Care Choice:    Choice offered to:     DME Arranged:    DME Agency:     HH Arranged:    HH Agency:     Status of Service:  In process, will continue to follow  If discussed at Long Length of Stay Meetings, dates discussed:    Additional Comments:  [name], RN 05/06/2017, 8:54 AM

## 2017-05-07 ENCOUNTER — Other Ambulatory Visit: Payer: Self-pay | Admitting: *Deleted

## 2017-05-07 DIAGNOSIS — K7460 Unspecified cirrhosis of liver: Secondary | ICD-10-CM

## 2017-05-07 LAB — GLUCOSE, CAPILLARY
GLUCOSE-CAPILLARY: 215 mg/dL — AB (ref 65–99)
Glucose-Capillary: 181 mg/dL — ABNORMAL HIGH (ref 65–99)
Glucose-Capillary: 212 mg/dL — ABNORMAL HIGH (ref 65–99)
Glucose-Capillary: 159 mg/dL — ABNORMAL HIGH (ref 65–99)
Glucose-Capillary: 212 mg/dL — ABNORMAL HIGH (ref 65–99)

## 2017-05-07 LAB — BASIC METABOLIC PANEL
Anion gap: 8 (ref 5–15)
BUN: 42 mg/dL — ABNORMAL HIGH (ref 6–20)
CHLORIDE: 101 mmol/L (ref 101–111)
CO2: 25 mmol/L (ref 22–32)
Calcium: 8.3 mg/dL — ABNORMAL LOW (ref 8.9–10.3)
Creatinine, Ser: 1.3 mg/dL — ABNORMAL HIGH (ref 0.44–1.00)
GFR calc Af Amer: 46 mL/min — ABNORMAL LOW (ref >60)
GFR calc non Af Amer: 39 mL/min — ABNORMAL LOW (ref >60)
GLUCOSE: 316 mg/dL — AB (ref 65–99)
POTASSIUM: 5.5 mmol/L — AB (ref 3.5–5.1)
Sodium: 134 mmol/L — ABNORMAL LOW (ref 135–145)

## 2017-05-07 MED ORDER — LACTULOSE 10 GM/15ML PO SOLN
30.0000 g | Freq: Three times a day (TID) | ORAL | Status: DC
  Administered 2017-05-07 – 2017-05-12 (×10): 30 g via ORAL
  Filled 2017-05-07 (×2): qty 60
  Filled 2017-05-07: qty 45
  Filled 2017-05-07 (×11): qty 60

## 2017-05-07 MED ORDER — SODIUM POLYSTYRENE SULFONATE 15 GM/60ML PO SUSP
30.0000 g | Freq: Once | ORAL | Status: AC
  Administered 2017-05-07: 30 g via ORAL
  Filled 2017-05-07: qty 120

## 2017-05-07 MED ORDER — INSULIN ASPART 100 UNIT/ML ~~LOC~~ SOLN
5.0000 [IU] | Freq: Three times a day (TID) | SUBCUTANEOUS | Status: DC
  Administered 2017-05-07 – 2017-05-11 (×12): 5 [IU] via SUBCUTANEOUS

## 2017-05-07 MED ORDER — METHYLPREDNISOLONE SODIUM SUCC 40 MG IJ SOLR
40.0000 mg | Freq: Three times a day (TID) | INTRAMUSCULAR | Status: DC
  Administered 2017-05-07 – 2017-05-08 (×3): 40 mg via INTRAVENOUS
  Filled 2017-05-07 (×3): qty 1

## 2017-05-07 NOTE — Progress Notes (Signed)
CSW met with the patient at bedside, explain role and reason for visist- to discuss discharge plan/PT recommends SNF rehab. Patient reports, " I do not want to go back to a rehab facility. Last time I went there and stayed three weeks they did not help me." Patient reports she has three neighbors she relies on to help with her household chores and activies of daily living.  Patient is open to home health if [facility] will approve. She states Nanci Pina will need referral paperwork completer prior to approving the Home Health services.  CSW signing off at this time. Please re-consult if CSW needs arise.   Kathrin Greathouse, Latanya Presser, MSW Clinical Social Worker  [phone] 05/07/2017  12:54 PM

## 2017-05-07 NOTE — Plan of Care (Signed)
Problem: Safety: Goal: Ability to remain free from injury will improve Outcome: Completed/Met Date Met: 05/07/17 Bed alarm in place, pt is aware to call for assistance when needing to get put of bed. Call bell placed within reach

## 2017-05-07 NOTE — Progress Notes (Signed)
PROGRESS NOTE    Ashley Savage  TTS:[id number] DOB: January 23, 1941 DOA: 05/05/2017 PCP: Mosie Lukes, MD    Brief Narrative: Ashley Savage is a 76 y.o. female with medical history significant for cirrhosis secondary to NASH, COPD with ongoing tobacco abuse,and insulin-dependent diabetes mellitus, now presenting to the emergency department for evaluation of dyspnea with minimal exertion. She was admitted for COPD exacerbation.   Assessment & Plan:   Principal Problem:   COPD with acute exacerbation ([facility]) Active Problems:   Thrombocytopenia (H[facility])   Anxiety and depression   Tobacco abuse   Diabetes mellitus type 2, controlled ([place])   Cirrhosis of liver without ascites ([facility])   Acute respiratory failure with hypoxia ([facility])   NASH (nonalcoholic steatohepatitis)   Acute on chronic respiratory failure with hypoxia:  Secondary to acute copd exacerbation.  She initially required BIPAP, IV solumedrol , duonebs and azithromycin.  She is transitioned to [place] oxygen, and keep sats around 90%.  She appears to be improving, [place] oxygen down to 2 lit  And her wheezing has improved.  Plan to transition to oral steroids by tomorrow.     Cirrhosis secondary to NASH.  Continue with rifaximin and lactulose. increse the dose of lactulose.    Insulin dependent DM:  - CBG (last 3)   Recent Labs  05/06/17 1138 05/06/17 1638 05/07/17 0757  GLUCAP 249* 284* 181*    increase novolog to 5 units units TIDAC, change to resistant scale SSI.  Resume 14 units of Lantus.     Thrombocytopenia:  Possibly from cirrhosis. No signs of bleeding.    Acute kidney injury:  Suspect from dehydration.  Gentle hydration.    Hyperkalemia: Unclear etiology. Added kayexalte.  Get EKG.     DVT prophylaxis:lovenox.  Code Status: full code.  Family Communication: none at bedside.  Disposition Plan: pending resolution of respiratory issues.   Consultants:   None.    Procedures: none.    Antimicrobials: azithromycin. Since admission.   Subjective: Breathing is better.   Objective: Vitals:   05/07/17 0500 05/07/17 0600 05/07/17 0800 05/07/17 1000  BP:  (!) 122/31 (!) 87/55   Pulse: 76 73 71 76  Resp: (!) 27 (!) 22 (!) 23 (!) 21  Temp:   (!) 97.5 F (36.4 C)   TempSrc:   Oral   SpO2: 95% 100% 91% 90%  Weight:      Height:        Intake/Output Summary (Last 24 hours) at 05/07/17 1150 Last data filed at 05/07/17 0600  Gross per 24 hour  Intake           2197.5 ml  Output              300 ml  Net           1897.5 ml   Filed Weights   05/05/17 1838  Weight: 65.8 kg (145 lb)    Examination:  General exam: Appears calm and comfortable , on 2l it of [place] oxygen.  Respiratory system:wheezing has improved. Air entry fair.  Cardiovascular system: S1 & S2 heard, RRR. No JVD, murmurs, rubs, gallops or clicks. No pedal edema. Gastrointestinal system: Abdomen is nondistended, soft and nontender. No organomegaly or masses felt. Normal bowel sounds heard. Central nervous system: Alert and oriented.  Extremities: Symmetric 5 x 5 power. No cyanosis or clubbing.  Skin: No rashes, lesions or ulcers Psychiatry: Judgement and insight appear normal. Mood & affect appropriate.     Data Reviewed:  I have personally reviewed following labs and imaging studies  CBC:  Recent Labs Lab 05/05/17 1853 05/06/17 0332  WBC 6.4 5.5  NEUTROABS 3.3 4.8  HGB 14.4 13.5  HCT 41.6 39.4  MCV 93.7 95.2  PLT 110* 557*   Basic Metabolic Panel:  Recent Labs Lab 05/05/17 1853 05/06/17 0332 05/07/17 0318  NA 137 135 134*  K 4.9 4.9 5.5*  CL 102 101 101  CO2 29 22 25   GLUCOSE 181* 307* 316*  BUN 18 24* 42*  CREATININE 0.95 1.23* 1.30*  CALCIUM 9.2 8.6* 8.3*   GFR: Estimated Creatinine Clearance: 35.5 mL/min (A) (by C-G formula based on SCr of 1.3 mg/dL (H)). Liver Function Tests:  Recent Labs Lab 05/05/17 1853 05/06/17 0332  AST 57* 52*  ALT 33 30  ALKPHOS 157* 149*   BILITOT 1.9* 1.7*  PROT 5.9* 5.4*  ALBUMIN 2.8* 2.4*   No results for input(s): LIPASE, AMYLASE in the last 168 hours.  Recent Labs Lab 05/05/17 1855  AMMONIA 47*   Coagulation Profile: No results for input(s): INR, PROTIME in the last 168 hours. Cardiac Enzymes:  Recent Labs Lab 05/05/17 1853  TROPONINI <0.03   BNP (last 3 results) No results for input(s): PROBNP in the last 8760 hours. HbA1C: No results for input(s): HGBA1C in the last 72 hours. CBG:  Recent Labs Lab 05/06/17 0322 05/06/17 0759 05/06/17 1138 05/06/17 1638 05/07/17 0757  GLUCAP 267* 310* 249* 284* 181*   Lipid Profile: No results for input(s): CHOL, HDL, LDLCALC, TRIG, CHOLHDL, LDLDIRECT in the last 72 hours. Thyroid Function Tests: No results for input(s): TSH, T4TOTAL, FREET4, T3FREE, THYROIDAB in the last 72 hours. Anemia Panel: No results for input(s): VITAMINB12, FOLATE, FERRITIN, TIBC, IRON, RETICCTPCT in the last 72 hours. Sepsis Labs: No results for input(s): PROCALCITON, LATICACIDVEN in the last 168 hours.  Recent Results (from the past 240 hour(s))  MRSA PCR Screening     Status: None   Collection Time: 05/06/17 12:40 AM  Result Value Ref Range Status   MRSA by PCR NEGATIVE NEGATIVE Final    Comment:        The GeneXpert MRSA Assay (FDA approved for NASAL specimens only), is one component of a comprehensive MRSA colonization surveillance program. It is not intended to diagnose MRSA infection nor to guide or monitor treatment for MRSA infections.   Culture, sputum-assessment     Status: None   Collection Time: 05/06/17 10:19 AM  Result Value Ref Range Status   Specimen Description EXPECTORATED SPUTUM  Final   Special Requests NONE  Final   Sputum evaluation THIS SPECIMEN IS ACCEPTABLE FOR SPUTUM CULTURE  Final   Report Status 05/06/2017 FINAL  Final  Culture, respiratory (NON-Expectorated)     Status: None (Preliminary result)   Collection Time: 05/06/17 10:19 AM   Result Value Ref Range Status   Specimen Description EXPECTORATED SPUTUM  Final   Special Requests NONE Reflexed from [id number]  Final   Gram Stain   Final    RARE WBC PRESENT, PREDOMINANTLY PMN FEW GRAM NEGATIVE RODS Performed at [facility], [place] [address] [place], [place] [address]    Culture MODERATE GRAM NEGATIVE RODS  Final   Report Status PENDING  Incomplete         Radiology Studies: Dg Chest Port 1 View  Result Date: 05/05/2017 CLINICAL DATA:  Shortness of Breath EXAM: PORTABLE CHEST 1 VIEW COMPARISON:  01/17/2017 FINDINGS: There is hyperinflation of the lungs compatible with COPD. Heart and  mediastinal contours are within normal limits. No focal opacities or effusions. No acute bony abnormality. IMPRESSION: COPD.  No active disease. Electronically Signed   By: Rolm Baptise M.D.   On: 05/05/2017 19:41        Scheduled Meds: . arformoterol  15 mcg Nebulization BID  . cholecalciferol  2,000 Units Oral q morning - 10a  . enoxaparin (LOVENOX) injection  40 mg Subcutaneous QHS  . insulin aspart  0-20 Units Subcutaneous TID WC  . insulin aspart  0-5 Units Subcutaneous QHS  . insulin aspart  3 Units Subcutaneous TID WC  . insulin glargine  14 Units Subcutaneous QHS  . lactulose  10 g Oral BID  . mouth rinse  15 mL Mouth Rinse BID  . methylPREDNISolone (SOLU-MEDROL) injection  60 mg Intravenous Q6H  . rifaximin  550 mg Oral BID  . sodium chloride flush  3 mL Intravenous Q12H  . sodium chloride flush  3 mL Intravenous Q12H  . sodium polystyrene  30 g Oral Once  . umeclidinium bromide  1 puff Inhalation Daily   Continuous Infusions: . sodium chloride    . sodium chloride 75 mL/hr at 05/07/17 0600  . azithromycin Stopped (05/06/17 2242)     LOS: 2 days    Time spent: [place], MD [facility] Pager [phone]  If 7PM-7AM, please contact night-coverage [email or web address] Password [email or web address] 05/07/2017, 11:50 AM

## 2017-05-07 NOTE — Consult Note (Signed)
   [facility] Inpatient Consult   05/07/2017  Ashley Savage 09-26-1940 [id number]     Made aware of hospitalization by (covering) [place].  [name] is active with [facility] program. She has been followed by Telephonic RNCM.   Please see patient outreach details under chart review tab then encounters.  [name] is currently in the stepdown unit at [facility]  Spoke with inpatient RNCM to make aware [facility] is following. Discussed that it appears [name] is declining SNF placement. Discussed that writer will make referral for [facility] follow up.  Will make referral for [facility] for transition of care.   [name], MSN-Ed, RN,BSN [facility] Liaison [phone]

## 2017-05-07 NOTE — Progress Notes (Signed)
Per report, patient originally admitted for COPD exacerbation. Initial plan was to place pt on Bipap but was not needed. Pt arrived to unit A&Ox4 out bed 1 assist. See assessment flow sheet.

## 2017-05-07 NOTE — Patient Outreach (Signed)
[facility]) Care Management  05/07/2017  Ashley Savage 1940-11-06 [id number]   Received notification of patient's emergency room visit.  Upon chart review patient admitted to Hospital on 05/05/2017 for COPD exacerbation requiring BIPAP initially.  Patient currently still hospitalized.  Plan: RN Health Coach will send notification of hospitalization to [place] to follow up with patient and discharge plans.  [place] [phone] [email or web address]

## 2017-05-08 ENCOUNTER — Inpatient Hospital Stay (HOSPITAL_COMMUNITY): Payer: PPO

## 2017-05-08 DIAGNOSIS — I340 Nonrheumatic mitral (valve) insufficiency: Secondary | ICD-10-CM

## 2017-05-08 DIAGNOSIS — J9601 Acute respiratory failure with hypoxia: Secondary | ICD-10-CM

## 2017-05-08 LAB — BASIC METABOLIC PANEL
ANION GAP: 9 (ref 5–15)
BUN: 46 mg/dL — AB (ref 6–20)
CALCIUM: 8.4 mg/dL — AB (ref 8.9–10.3)
CO2: 25 mmol/L (ref 22–32)
Chloride: 103 mmol/L (ref 101–111)
Creatinine, Ser: 1.03 mg/dL — ABNORMAL HIGH (ref 0.44–1.00)
GFR calc Af Amer: >60 mL/min (ref >60)
GFR calc non Af Amer: 52 mL/min — ABNORMAL LOW (ref >60)
GLUCOSE: 185 mg/dL — AB (ref 65–99)
Potassium: 4.2 mmol/L (ref 3.5–5.1)
Sodium: 137 mmol/L (ref 135–145)

## 2017-05-08 LAB — ECHOCARDIOGRAM COMPLETE
CHL CUP DOP CALC LVOT VTI: 38.2 cm
CHL CUP TV REG PEAK VELOCITY: 311 cm/s
E decel time: 257 msec
E/e' ratio: 15.78
FS: 32 % (ref 28–44)
HEIGHTINCHES: 66 in
IVS/LV PW RATIO, ED: 0.96
LA ID, A-P, ES: 40 mm
LA diam index: 2.3 cm/m2
LA vol index: 32.8 mL/m2
LA vol: 57.1 mL
LAVOLA4C: 47.1 mL
LDCA: 2.54 cm2
LEFT ATRIUM END SYS DIAM: 40 mm
LV E/e'average: 15.78
LV PW d: 9.82 mm — AB (ref 0.6–1.1)
LV TDI E'LATERAL: 8.05
LV e' LATERAL: 8.05 cm/s
LVEEMED: 15.78
LVOT diameter: 18 mm
LVOT peak grad rest: 12 mmHg
LVOT peak vel: 172 cm/s
LVOTSV: 97 mL
MV Dec: 257
MV pk A vel: 138 m/s
MV pk E vel: 127 m/s
MVPG: 6 mmHg
RV LATERAL S' VELOCITY: 22.4 cm/s
RV sys press: 42 mmHg
TAPSE: 31.9 mm
TDI e' medial: 7.51
TR max vel: 311 cm/s
WEIGHTICAEL: 2320 [oz_av]

## 2017-05-08 LAB — CULTURE, RESPIRATORY

## 2017-05-08 LAB — MAGNESIUM: Magnesium: 2 mg/dL (ref 1.7–2.4)

## 2017-05-08 LAB — GLUCOSE, CAPILLARY
GLUCOSE-CAPILLARY: 228 mg/dL — AB (ref 65–99)
Glucose-Capillary: 154 mg/dL — ABNORMAL HIGH (ref 65–99)
Glucose-Capillary: 164 mg/dL — ABNORMAL HIGH (ref 65–99)

## 2017-05-08 LAB — CULTURE, RESPIRATORY W GRAM STAIN

## 2017-05-08 MED ORDER — FUROSEMIDE 10 MG/ML IJ SOLN
20.0000 mg | Freq: Once | INTRAMUSCULAR | Status: AC
  Administered 2017-05-08: 20 mg via INTRAVENOUS
  Filled 2017-05-08: qty 2

## 2017-05-08 MED ORDER — ALUM & MAG HYDROXIDE-SIMETH 200-200-20 MG/5ML PO SUSP
30.0000 mL | ORAL | Status: DC | PRN
  Administered 2017-05-08: 30 mL via ORAL
  Filled 2017-05-08: qty 30

## 2017-05-08 MED ORDER — LEVOFLOXACIN 750 MG PO TABS
750.0000 mg | ORAL_TABLET | ORAL | Status: DC
  Administered 2017-05-08: 750 mg via ORAL
  Filled 2017-05-08: qty 1

## 2017-05-08 MED ORDER — PREDNISONE 50 MG PO TABS
60.0000 mg | ORAL_TABLET | Freq: Every day | ORAL | Status: DC
  Administered 2017-05-09: 09:00:00 60 mg via ORAL
  Filled 2017-05-08: qty 1

## 2017-05-08 MED ORDER — PANTOPRAZOLE SODIUM 40 MG PO TBEC
40.0000 mg | DELAYED_RELEASE_TABLET | Freq: Every day | ORAL | Status: DC
  Administered 2017-05-08 – 2017-05-12 (×5): 40 mg via ORAL
  Filled 2017-05-08 (×5): qty 1

## 2017-05-08 MED ORDER — BENZONATATE 100 MG PO CAPS: 200.0000 mg | ORAL_CAPSULE | Freq: Three times a day (TID) | ORAL | Status: DC | PRN

## 2017-05-08 MED ORDER — LEVOFLOXACIN 750 MG PO TABS: 750.0000 mg | ORAL_TABLET | Freq: Every day | ORAL | Status: DC

## 2017-05-08 NOTE — Progress Notes (Signed)
PROGRESS NOTE    Ashley Savage  RKY:[id number] DOB: [birth year] DOA: 05/05/2017 PCP: Mosie Lukes, MD    Brief Narrative: Ashley Savage is a 76 y.o. female with medical history significant for cirrhosis secondary to NASH, COPD with ongoing tobacco abuse,and insulin-dependent diabetes mellitus, now presenting to the emergency department for evaluation of dyspnea with minimal exertion. She was admitted for COPD exacerbation.   Assessment & Plan:   Principal Problem:   COPD with acute exacerbation ([place]) Active Problems:   Thrombocytopenia (HCC)   Anxiety and depression   Tobacco abuse   Diabetes mellitus type 2, controlled ([place])   Cirrhosis of liver without ascites (HCC)   Acute respiratory failure with hypoxia (HCC)   NASH (nonalcoholic steatohepatitis)   Acute on chronic respiratory failure with hypoxia:  Secondary to acute copd exacerbation.  She initially required BIPAP, IV solumedrol , duonebs and azithromycin.  She is transitioned to [place] oxygen, and keep sats around 90%.  She appears to be improving, [place] oxygen down to 2 lit  And her wheezing has improved.  Transitioned to oral prednisone today.     Cirrhosis secondary to NASH.  Continue with rifaximin and lactulose. increse the dose of lactulose.    Insulin dependent DM:  - CBG (last 3)   Recent Labs  05/07/17 2219 05/08/17 0752 05/08/17 1123  GLUCAP 212* 154* 228*    increase novolog to 5 units units TIDAC, change to resistant scale SSI.  Resume 14 units of Lantus.     Thrombocytopenia:  Possibly from cirrhosis. No signs of bleeding.    Acute kidney injury:  Suspect from dehydration.  Gentle hydration. Repeat renal parameters show improvement.    Hyperkalemia: Unclear etiology. Added kayexalte.  Repeat K is normal.    14 beat V TACH, Pt asymptomatic. Mag level ordered  And echocardiogram ordered showed moderate pulmonary hypertension.   Sputum cultures growing STENOTROPHOMONAS MALTOPHILIA    Changed antibiotic azithromycin to levaquin to complete the course.     DVT prophylaxis:lovenox.  Code Status: full code.  Family Communication: none at bedside.  Disposition Plan: pending resolution of respiratory issues.   Consultants:   None.    Procedures: none.   Antimicrobials: azithromycin. Since admission.   Subjective: Breathing is better. Cough is worse.   Objective: Vitals:   05/08/17 0607 05/08/17 0757 05/08/17 1026 05/08/17 1341  BP: (!) 116/49 (!) 124/54  (!) 133/50  Pulse: 85 86  84  Resp: 20   (!) 22  Temp: 98.1 F (36.7 C)   97.6 F (36.4 C)  TempSrc: Oral   Oral  SpO2: 94% 91% 92% 97%  Weight:      Height:        Intake/Output Summary (Last 24 hours) at 05/08/17 1631 Last data filed at 05/08/17 0836  Gross per 24 hour  Intake              640 ml  Output                0 ml  Net              640 ml   Filed Weights   05/05/17 1838  Weight: 65.8 kg (145 lb)    Examination:  General exam: Appears anxious and restless from coughing.  , on 2l it of [place] oxygen.  Respiratory system:wheezing has improved, posteiorly heard. . Air entry fair.  Cardiovascular system: S1 & S2 heard, RRR. No JVD, murmurs, rubs, gallops or clicks.  Gastrointestinal system: Abdomen is nondistended, soft and nontender. No organomegaly or masses felt. Normal bowel sounds heard. Central nervous system: Alert and oriented.  Extremities: Symmetric 5 x 5 power. No cyanosis or clubbing.  Skin: No rashes, lesions or ulcers Psychiatry: Judgement and insight appear normal. Mood & affect appropriate.     Data Reviewed: I have personally reviewed following labs and imaging studies  CBC:  Recent Labs Lab 05/05/17 1853 05/06/17 0332  WBC 6.4 5.5  NEUTROABS 3.3 4.8  HGB 14.4 13.5  HCT 41.6 39.4  MCV 93.7 95.2  PLT 110* 517*   Basic Metabolic Panel:  Recent Labs Lab 05/05/17 1853 05/06/17 0332 05/07/17 0318 05/08/17 0421  NA 137 135 134* 137  K 4.9 4.9 5.5* 4.2   CL 102 101 101 103  CO2 29 22 25 25   GLUCOSE 181* 307* 316* 185*  BUN 18 24* 42* 46*  CREATININE 0.95 1.23* 1.30* 1.03*  CALCIUM 9.2 8.6* 8.3* 8.4*  MG  --   --   --  2.0   GFR: Estimated Creatinine Clearance: 44.9 mL/min (A) (by C-G formula based on SCr of 1.03 mg/dL (H)). Liver Function Tests:  Recent Labs Lab 05/05/17 1853 05/06/17 0332  AST 57* 52*  ALT 33 30  ALKPHOS 157* 149*  BILITOT 1.9* 1.7*  PROT 5.9* 5.4*  ALBUMIN 2.8* 2.4*   No results for input(s): LIPASE, AMYLASE in the last 168 hours.  Recent Labs Lab 05/05/17 1855  AMMONIA 47*   Coagulation Profile: No results for input(s): INR, PROTIME in the last 168 hours. Cardiac Enzymes:  Recent Labs Lab 05/05/17 1853  TROPONINI <0.03   BNP (last 3 results) No results for input(s): PROBNP in the last 8760 hours. HbA1C: No results for input(s): HGBA1C in the last 72 hours. CBG:  Recent Labs Lab 05/07/17 1236 05/07/17 1745 05/07/17 2219 05/08/17 0752 05/08/17 1123  GLUCAP 212* 159* 212* 154* 228*   Lipid Profile: No results for input(s): CHOL, HDL, LDLCALC, TRIG, CHOLHDL, LDLDIRECT in the last 72 hours. Thyroid Function Tests: No results for input(s): TSH, T4TOTAL, FREET4, T3FREE, THYROIDAB in the last 72 hours. Anemia Panel: No results for input(s): VITAMINB12, FOLATE, FERRITIN, TIBC, IRON, RETICCTPCT in the last 72 hours. Sepsis Labs: No results for input(s): PROCALCITON, LATICACIDVEN in the last 168 hours.  Recent Results (from the past 240 hour(s))  MRSA PCR Screening     Status: None   Collection Time: 05/06/17 12:40 AM  Result Value Ref Range Status   MRSA by PCR NEGATIVE NEGATIVE Final    Comment:        The GeneXpert MRSA Assay (FDA approved for NASAL specimens only), is one component of a comprehensive MRSA colonization surveillance program. It is not intended to diagnose MRSA infection nor to guide or monitor treatment for MRSA infections.   Culture, sputum-assessment      Status: None   Collection Time: 05/06/17 10:19 AM  Result Value Ref Range Status   Specimen Description EXPECTORATED SPUTUM  Final   Special Requests NONE  Final   Sputum evaluation THIS SPECIMEN IS ACCEPTABLE FOR SPUTUM CULTURE  Final   Report Status 05/06/2017 FINAL  Final  Culture, respiratory (NON-Expectorated)     Status: None   Collection Time: 05/06/17 10:19 AM  Result Value Ref Range Status   Specimen Description EXPECTORATED SPUTUM  Final   Special Requests NONE Reflexed from [id number]  Final   Gram Stain   Final    RARE WBC PRESENT, PREDOMINANTLY PMN FEW GRAM  NEGATIVE RODS Performed at [facility], [place] [address], [place], [place] [address]    Culture MODERATE STENOTROPHOMONAS MALTOPHILIA  Final   Report Status 05/08/2017 FINAL  Final   Organism ID, Bacteria STENOTROPHOMONAS MALTOPHILIA  Final      Susceptibility   Stenotrophomonas maltophilia - MIC*    LEVOFLOXACIN 0.5 SENSITIVE Sensitive     TRIMETH/SULFA <=20 SENSITIVE Sensitive     * MODERATE STENOTROPHOMONAS MALTOPHILIA         Radiology Studies: No results found.      Scheduled Meds: . arformoterol  15 mcg Nebulization BID  . cholecalciferol  2,000 Units Oral q morning - 10a  . enoxaparin (LOVENOX) injection  40 mg Subcutaneous QHS  . insulin aspart  0-20 Units Subcutaneous TID WC  . insulin aspart  0-5 Units Subcutaneous QHS  . insulin aspart  5 Units Subcutaneous TID WC  . insulin glargine  14 Units Subcutaneous QHS  . lactulose  30 g Oral TID  . levofloxacin  750 mg Oral Q48H  . mouth rinse  15 mL Mouth Rinse BID  . methylPREDNISolone (SOLU-MEDROL) injection  40 mg Intravenous Q8H  . pantoprazole  40 mg Oral Daily  . rifaximin  550 mg Oral BID  . sodium chloride flush  3 mL Intravenous Q12H  . sodium chloride flush  3 mL Intravenous Q12H  . umeclidinium bromide  1 puff Inhalation Daily   Continuous Infusions: . sodium chloride       LOS: 3 days    Time spent: [place], MD [facility] Pager [phone]  If 7PM-7AM, please contact night-coverage [email or web address] Password [email or web address] 05/08/2017, 4:31 PM

## 2017-05-08 NOTE — Progress Notes (Signed)
Patient had 18 beats of Vtach. RN assessed patient. She was eating breakfast. Denied chest pain or dizziness. BP 124/54, HR 86. MD made aware. Will continue to monitor patient.   Ashley Savage [facility] 05/08/2017  8:02 AM

## 2017-05-08 NOTE — Progress Notes (Signed)
  Echocardiogram 2D Echocardiogram has been performed.  Johny Chess 05/08/2017, 11:21 AM

## 2017-05-09 DIAGNOSIS — F329 Major depressive disorder, single episode, unspecified: Secondary | ICD-10-CM

## 2017-05-09 DIAGNOSIS — F419 Anxiety disorder, unspecified: Secondary | ICD-10-CM

## 2017-05-09 LAB — GLUCOSE, CAPILLARY
GLUCOSE-CAPILLARY: 242 mg/dL — AB (ref 65–99)
GLUCOSE-CAPILLARY: 146 mg/dL — AB (ref 65–99)
GLUCOSE-CAPILLARY: 203 mg/dL — AB (ref 65–99)
Glucose-Capillary: 169 mg/dL — ABNORMAL HIGH (ref 65–99)
Glucose-Capillary: 269 mg/dL — ABNORMAL HIGH (ref 65–99)

## 2017-05-09 MED ORDER — GUAIFENESIN ER 600 MG PO TB12
1200.0000 mg | ORAL_TABLET | Freq: Two times a day (BID) | ORAL | Status: DC | PRN
  Administered 2017-05-09: 1200 mg via ORAL
  Filled 2017-05-09: qty 2

## 2017-05-09 MED ORDER — METHYLPREDNISOLONE SODIUM SUCC 40 MG IJ SOLR
40.0000 mg | Freq: Three times a day (TID) | INTRAMUSCULAR | Status: DC
  Administered 2017-05-09 – 2017-05-10 (×4): 40 mg via INTRAVENOUS
  Filled 2017-05-09 (×4): qty 1

## 2017-05-09 NOTE — Progress Notes (Signed)
PRN Mucinex ordered because pt was having trouble coughing up mucus.

## 2017-05-09 NOTE — Progress Notes (Signed)
PROGRESS NOTE    Ashley Savage  YCX:[id number] DOB: 1941-02-24 DOA: 05/05/2017 PCP: Mosie Lukes, MD    Brief Narrative: Ashley Savage is a 76 y.o. female with medical history significant for cirrhosis secondary to NASH, COPD with ongoing tobacco abuse,and insulin-dependent diabetes mellitus, now presenting to the emergency department for evaluation of dyspnea with minimal exertion. She was admitted for COPD exacerbation.   Assessment & Plan:   Principal Problem:   COPD with acute exacerbation ([place]) Active Problems:   Thrombocytopenia (HCC)   Anxiety and depression   Tobacco abuse   Diabetes mellitus type 2, controlled ([place])   Cirrhosis of liver without ascites (HCC)   Acute respiratory failure with hypoxia (HCC)   NASH (nonalcoholic steatohepatitis)   Acute on chronic respiratory failure with hypoxia:  Secondary to acute copd exacerbation.  She initially required BIPAP, IV solumedrol , duonebs and azithromycin.  She is transitioned to [place] oxygen, and keep sats around 90%.  This morning, she was found to be diffusely wheezing and reports worsening breathing and cough  Added mucinex and IV solumedrol.  Will reevaluate in am and if her breathing improves will discharge her home in am.     Cirrhosis secondary to NASH.  Continue with rifaximin and lactulose. increase the dose of lactulose.    Insulin dependent DM:  - CBG (last 3)   Recent Labs  05/09/17 0813 05/09/17 1130 05/09/17 1637  GLUCAP 146* 203* 169*    increase novolog to 5 units units TIDAC, change to resistant scale SSI.  Resume 14 units of Lantus.  cbgs well controlled  No change in meds.     Thrombocytopenia:  Possibly from cirrhosis. No signs of bleeding.    Acute kidney injury:  Suspect from dehydration.  Gentle hydration. Repeat renal parameters show improvement. Repeat BMP in am.    Hyperkalemia: Unclear etiology. Added kayexalte.  Repeat K is normal.    14 beat V TACH, Pt  asymptomatic. Mag level ordered  And echocardiogram ordered showed moderate pulmonary hypertension.  No mre episodes of V TACH.   Sputum cultures growing STENOTROPHOMONAS MALTOPHILIA  Changed antibiotic azithromycin to levaquin to complete the course.      DVT prophylaxis:lovenox.  Code Status: full code.  Family Communication: none at bedside.  Disposition Plan: pending resolution of respiratory issues.   Consultants:   None.    Procedures: none.   Antimicrobials: levaquin.   Subjective: Worsening cough today.   Objective: Vitals:   05/09/17 1251 05/09/17 1400 05/09/17 1500 05/09/17 1501  BP:  119/63    Pulse:  86    Resp:  18    Temp:  98.3 F (36.8 C)    TempSrc:  Oral    SpO2: 90% 93% 91% 94%  Weight:      Height:        Intake/Output Summary (Last 24 hours) at 05/09/17 1750 Last data filed at 05/09/17 1300  Gross per 24 hour  Intake              521 ml  Output                0 ml  Net              521 ml   Filed Weights   05/05/17 1838  Weight: 65.8 kg (145 lb)    Examination:  General exam: Appears anxious and restless from coughing.  , on 2l it of [place] oxygen.  Respiratory system: bilateral posterior  and anterior wheezing heard.  Cardiovascular system: S1 & S2 heard, tachycardic RRR. No JVD.  Gastrointestinal system: Abdomen is SOFT non tender non distended bowel sounds heard.  Central nervous system: Alert and oriented.  Extremities: Symmetric 5 x 5 power. No cyanosis or clubbing.  Skin: No rashes, lesions or ulcers Psychiatry: Mood & affect appropriate.     Data Reviewed: I have personally reviewed following labs and imaging studies  CBC:  Recent Labs Lab 05/05/17 1853 05/06/17 0332  WBC 6.4 5.5  NEUTROABS 3.3 4.8  HGB 14.4 13.5  HCT 41.6 39.4  MCV 93.7 95.2  PLT 110* 650*   Basic Metabolic Panel:  Recent Labs Lab 05/05/17 1853 05/06/17 0332 05/07/17 0318 05/08/17 0421  NA 137 135 134* 137  K 4.9 4.9 5.5* 4.2  CL 102  101 101 103  CO2 29 22 25 25   GLUCOSE 181* 307* 316* 185*  BUN 18 24* 42* 46*  CREATININE 0.95 1.23* 1.30* 1.03*  CALCIUM 9.2 8.6* 8.3* 8.4*  MG  --   --   --  2.0   GFR: Estimated Creatinine Clearance: 44.9 mL/min (A) (by C-G formula based on SCr of 1.03 mg/dL (H)). Liver Function Tests:  Recent Labs Lab 05/05/17 1853 05/06/17 0332  AST 57* 52*  ALT 33 30  ALKPHOS 157* 149*  BILITOT 1.9* 1.7*  PROT 5.9* 5.4*  ALBUMIN 2.8* 2.4*   No results for input(s): LIPASE, AMYLASE in the last 168 hours.  Recent Labs Lab 05/05/17 1855  AMMONIA 47*   Coagulation Profile: No results for input(s): INR, PROTIME in the last 168 hours. Cardiac Enzymes:  Recent Labs Lab 05/05/17 1853  TROPONINI <0.03   BNP (last 3 results) No results for input(s): PROBNP in the last 8760 hours. HbA1C: No results for input(s): HGBA1C in the last 72 hours. CBG:  Recent Labs Lab 05/08/17 1637 05/08/17 2225 05/09/17 0813 05/09/17 1130 05/09/17 1637  GLUCAP 242* 164* 146* 203* 169*   Lipid Profile: No results for input(s): CHOL, HDL, LDLCALC, TRIG, CHOLHDL, LDLDIRECT in the last 72 hours. Thyroid Function Tests: No results for input(s): TSH, T4TOTAL, FREET4, T3FREE, THYROIDAB in the last 72 hours. Anemia Panel: No results for input(s): VITAMINB12, FOLATE, FERRITIN, TIBC, IRON, RETICCTPCT in the last 72 hours. Sepsis Labs: No results for input(s): PROCALCITON, LATICACIDVEN in the last 168 hours.  Recent Results (from the past 240 hour(s))  MRSA PCR Screening     Status: None   Collection Time: 05/06/17 12:40 AM  Result Value Ref Range Status   MRSA by PCR NEGATIVE NEGATIVE Final    Comment:        The GeneXpert MRSA Assay (FDA approved for NASAL specimens only), is one component of a comprehensive MRSA colonization surveillance program. It is not intended to diagnose MRSA infection nor to guide or monitor treatment for MRSA infections.   Culture, sputum-assessment     Status:  None   Collection Time: 05/06/17 10:19 AM  Result Value Ref Range Status   Specimen Description EXPECTORATED SPUTUM  Final   Special Requests NONE  Final   Sputum evaluation THIS SPECIMEN IS ACCEPTABLE FOR SPUTUM CULTURE  Final   Report Status 05/06/2017 FINAL  Final  Culture, respiratory (NON-Expectorated)     Status: None   Collection Time: 05/06/17 10:19 AM  Result Value Ref Range Status   Specimen Description EXPECTORATED SPUTUM  Final   Special Requests NONE Reflexed from [id number]  Final   Gram Stain   Final  RARE WBC PRESENT, PREDOMINANTLY PMN FEW GRAM NEGATIVE RODS Performed at [facility], [place] [address] [place], [place] [address]    Culture MODERATE STENOTROPHOMONAS MALTOPHILIA  Final   Report Status 05/08/2017 FINAL  Final   Organism ID, Bacteria STENOTROPHOMONAS MALTOPHILIA  Final      Susceptibility   Stenotrophomonas maltophilia - MIC*    LEVOFLOXACIN 0.5 SENSITIVE Sensitive     TRIMETH/SULFA <=20 SENSITIVE Sensitive     * MODERATE STENOTROPHOMONAS MALTOPHILIA         Radiology Studies: No results found.      Scheduled Meds: . arformoterol  15 mcg Nebulization BID  . cholecalciferol  2,000 Units Oral q morning - 10a  . enoxaparin (LOVENOX) injection  40 mg Subcutaneous QHS  . insulin aspart  0-20 Units Subcutaneous TID WC  . insulin aspart  0-5 Units Subcutaneous QHS  . insulin aspart  5 Units Subcutaneous TID WC  . insulin glargine  14 Units Subcutaneous QHS  . lactulose  30 g Oral TID  . levofloxacin  750 mg Oral Q48H  . mouth rinse  15 mL Mouth Rinse BID  . methylPREDNISolone (SOLU-MEDROL) injection  40 mg Intravenous Q8H  . pantoprazole  40 mg Oral Daily  . rifaximin  550 mg Oral BID  . sodium chloride flush  3 mL Intravenous Q12H  . sodium chloride flush  3 mL Intravenous Q12H  . umeclidinium bromide  1 puff Inhalation Daily   Continuous Infusions: . sodium chloride       LOS: 4 days    Time spent: [place],  MD [facility] Pager [phone]  If 7PM-7AM, please contact night-coverage [email or web address] Password [email or web address] 05/09/2017, 5:50 PM

## 2017-05-10 ENCOUNTER — Encounter: Payer: Self-pay | Admitting: *Deleted

## 2017-05-10 LAB — BLOOD GAS, ARTERIAL
Acid-Base Excess: 3.3 mmol/L — ABNORMAL HIGH (ref 0.0–2.0)
Bicarbonate: 27.7 mmol/L (ref 20.0–28.0)
Drawn by: 441261
FIO2: 21
O2 Saturation: 87.6 %
PCO2 ART: 43.3 mmHg (ref 32.0–48.0)
PH ART: 7.423 (ref 7.350–7.450)
Patient temperature: 98.6
pO2, Arterial: 57.4 mmHg — ABNORMAL LOW (ref 83.0–108.0)

## 2017-05-10 LAB — CBC
HEMATOCRIT: 39.5 % (ref 36.0–46.0)
Hemoglobin: 13.6 g/dL (ref 12.0–15.0)
MCH: 32.3 pg (ref 26.0–34.0)
MCHC: 34.4 g/dL (ref 30.0–36.0)
MCV: 93.8 fL (ref 78.0–100.0)
Platelets: 98 10*3/uL — ABNORMAL LOW (ref 150–400)
RBC: 4.21 MIL/uL (ref 3.87–5.11)
RDW: 15.3 % (ref 11.5–15.5)
WBC: 9 10*3/uL (ref 4.0–10.5)

## 2017-05-10 LAB — GLUCOSE, CAPILLARY
GLUCOSE-CAPILLARY: 250 mg/dL — AB (ref 65–99)
GLUCOSE-CAPILLARY: 155 mg/dL — AB (ref 65–99)
GLUCOSE-CAPILLARY: 96 mg/dL (ref 65–99)
Glucose-Capillary: 193 mg/dL — ABNORMAL HIGH (ref 65–99)

## 2017-05-10 LAB — BASIC METABOLIC PANEL
Anion gap: 7 (ref 5–15)
BUN: 39 mg/dL — AB (ref 6–20)
CALCIUM: 8.7 mg/dL — AB (ref 8.9–10.3)
CO2: 28 mmol/L (ref 22–32)
CREATININE: 0.86 mg/dL (ref 0.44–1.00)
Chloride: 102 mmol/L (ref 101–111)
GFR calc non Af Amer: >60 mL/min (ref >60)
GLUCOSE: 228 mg/dL — AB (ref 65–99)
Potassium: 4.7 mmol/L (ref 3.5–5.1)
Sodium: 137 mmol/L (ref 135–145)

## 2017-05-10 LAB — AMMONIA: Ammonia: 48 umol/L — ABNORMAL HIGH (ref 9–35)

## 2017-05-10 MED ORDER — METHYLPREDNISOLONE SODIUM SUCC 40 MG IJ SOLR
40.0000 mg | INTRAMUSCULAR | Status: DC
  Filled 2017-05-10: qty 1

## 2017-05-10 MED ORDER — LEVOFLOXACIN 750 MG PO TABS
750.0000 mg | ORAL_TABLET | Freq: Every day | ORAL | Status: DC
  Administered 2017-05-10 – 2017-05-12 (×3): 750 mg via ORAL
  Filled 2017-05-10 (×3): qty 1

## 2017-05-10 NOTE — Progress Notes (Signed)
Physical Therapy Treatment Patient Details Name: Ashley Savage MRN: [id number] DOB: [birth year] Today's Date: 05/10/2017    History of Present Illness 76 year old female was admitted for COPD exacerbation.  PMH:  DM, cirrhosis, breast CA, and chronic leg edema    PT Comments    Pt assisted with ambulating in hallway and requires supplemental oxygen. SATURATION QUALIFICATIONS: (This note is used to comply with regulatory documentation for home oxygen)  Patient Saturations on Room Air at Rest = 90%  Patient Saturations on Room Air while Ambulating = 83%  Patient Saturations on 4 Liters of oxygen while Ambulating = 90%  Please briefly explain why patient needs home oxygen: to improve oxygen saturations with physical activity such as ambulation   Follow Up Recommendations  SNF (would benefit from SNF, pt prefers home, HHPT and 24/7 assist recommended)     Equipment Recommendations  None recommended by PT    Recommendations for Other Services       Precautions / Restrictions Precautions Precautions: Fall Precaution Comments: monitor O2 sat Restrictions Weight Bearing Restrictions: No    Mobility  Bed Mobility Overal bed mobility: Modified Independent                Transfers Overall transfer level: Needs assistance Equipment used: Rolling walker (2 wheeled) Transfers: Sit to/from Stand Sit to Stand: Min guard         General transfer comment: verbal cues for safe technique  Ambulation/Gait Ambulation/Gait assistance: Min guard Ambulation Distance (Feet): 60 Feet Assistive device: Rolling walker (2 wheeled) Gait Pattern/deviations: Step-through pattern;Decreased stride length     General Gait Details: verbal cues for safe use of RW, started on room air however SPO2 dropped to 83% so reapplied 4L O2 , one standing rest break with cues for pursed lip breathing   Stairs            Wheelchair Mobility    Modified Rankin (Stroke Patients Only)       Balance                                            Cognition Arousal/Alertness: Awake/alert Behavior During Therapy: WFL for tasks assessed/performed Overall Cognitive Status: Within Functional Limits for tasks assessed                                        Exercises      General Comments        Pertinent Vitals/Pain Pain Assessment: No/denies pain    Home Living                      Prior Function            PT Goals (current goals can now be found in the care plan section) Progress towards PT goals: Progressing toward goals    Frequency    Min 3X/week      PT Plan Current plan remains appropriate    Co-evaluation              AM-PAC PT "6 Clicks" Daily Activity  Outcome Measure  Difficulty turning over in bed (including adjusting bedclothes, sheets and blankets)?: A Little Difficulty moving from lying on back to sitting on the side of the bed? : A Little Difficulty  sitting down on and standing up from a chair with arms (Savage.g., wheelchair, bedside commode, etc,.)?: A Little Help needed moving to and from a bed to chair (including a wheelchair)?: A Little Help needed walking in hospital room?: A Little Help needed climbing 3-5 steps with a railing? : A Lot 6 Click Score: 17    End of Session Equipment Utilized During Treatment: Oxygen;Gait belt Activity Tolerance: Patient limited by fatigue Patient left: in chair;with chair alarm set;with call bell/phone within reach Nurse Communication: Mobility status PT Visit Diagnosis: Difficulty in walking, not elsewhere classified (R26.2);Muscle weakness (generalized) (M62.81)     Time: 0017-4944 PT Time Calculation (min) (ACUTE ONLY): 16 min  Charges:  $Gait Training: 8-22 mins                    G Codes:      Ashley Savage, PT, DPT 05/10/2017 Pager: [phone]   Ashley Savage 05/10/2017, 1:30 PM

## 2017-05-10 NOTE — Progress Notes (Signed)
PROGRESS NOTE    Ashley Savage  HER:[id number] DOB: 28-Apr-1941 DOA: 05/05/2017 PCP: Mosie Lukes, MD    Brief Narrative: Ashley Savage is a 76 y.o. female with medical history significant for cirrhosis secondary to NASH, COPD with ongoing tobacco abuse,and insulin-dependent diabetes mellitus, now presenting to the emergency department for evaluation of dyspnea with minimal exertion. She was admitted for COPD exacerbation.   Assessment & Plan:   Principal Problem:   COPD with acute exacerbation ([place]) Active Problems:   Thrombocytopenia (HCC)   Anxiety and depression   Tobacco abuse   Diabetes mellitus type 2, controlled ([place])   Cirrhosis of liver without ascites (HCC)   Acute respiratory failure with hypoxia (HCC)   NASH (nonalcoholic steatohepatitis)   Acute on chronic respiratory failure with hypoxia:  Secondary to acute copd exacerbation.  She initially required BIPAP, IV solumedrol , duonebs and azithromycin.  She is transitioned to [place] oxygen, and keep sats around 90%.  She appears slightly better than yesterday.     Cirrhosis secondary to NASH.  Continue with rifaximin and lactulose. increase the dose of lactulose.    Insulin dependent DM:  - CBG (last 3)   Recent Labs  05/09/17 2141 05/10/17 0741 05/10/17 1203  GLUCAP 269* 193* 250*    increase novolog to 5 units units TIDAC, change to resistant scale SSI.  Resume 14 units of Lantus.  cbgs well controlled  No change in meds.     Thrombocytopenia:  Possibly from cirrhosis. No signs of bleeding.    Acute kidney injury:  Suspect from dehydration.  Gentle hydration. Repeat renal parameters show improvement. Repeat BMP in am.    Hyperkalemia: Unclear etiology. Added kayexalte.  Repeat K is normal.    14 beat V TACH, Pt asymptomatic. Mag level ordered  And echocardiogram ordered showed moderate pulmonary hypertension.  No more episodes of V TACH.   Sputum cultures growing STENOTROPHOMONAS  MALTOPHILIA  Changed antibiotic azithromycin to levaquin to complete the course.    Acute hepatic encephalopathy;  Get ammonia level, it appears she refused to take lactulose and became confused earlier today.  Encourage to take lactulose.  Discussed with the patient and RN.   DVT prophylaxis:lovenox.  Code Status: full code.  Family Communication: none at bedside.  Disposition Plan: pending resolution of respiratory issues.   Consultants:   None.    Procedures: none.   Antimicrobials: levaquin.   Subjective: Cough is better, with mucinex added. Confused and teary .  RN reports bowel movements on the floor.   Objective: Vitals:   05/09/17 2143 05/10/17 0548 05/10/17 0752 05/10/17 1351  BP: 128/61 (!) 122/58  140/64  Pulse: 87 69 68 79  Resp: 18 18 20 18   Temp: 98.7 F (37.1 C) 97.8 F (36.6 C)  98.1 F (36.7 C)  TempSrc: Oral Oral  Oral  SpO2: 95% 95% 94% 98%  Weight:      Height:        Intake/Output Summary (Last 24 hours) at 05/10/17 1640 Last data filed at 05/10/17 1400  Gross per 24 hour  Intake              486 ml  Output                0 ml  Net              486 ml   Filed Weights   05/05/17 1838  Weight: 65.8 kg (145 lb)    Examination:  General exam: anxious, teary on 4l it of [place] oxygen.  Respiratory system: wheezing better.  Cardiovascular system: S1 & S2 heard, tachycardic RRR. No JVD.  Gastrointestinal system: Abdomen is  Soft non tender non distended bowel sounds heard.  Central nervous system: confused , but non focal.  Extremities: cyanosis and clubbing.  Skin: No rashes, lesions or ulcers Psychiatry: anxious.      Data Reviewed: I have personally reviewed following labs and imaging studies  CBC:  Recent Labs Lab 05/05/17 1853 05/06/17 0332 05/10/17 0505  WBC 6.4 5.5 9.0  NEUTROABS 3.3 4.8  --   HGB 14.4 13.5 13.6  HCT 41.6 39.4 39.5  MCV 93.7 95.2 93.8  PLT 110* 104* 98*   Basic Metabolic Panel:  Recent Labs Lab  05/05/17 1853 05/06/17 0332 05/07/17 0318 05/08/17 0421 05/10/17 0505  NA 137 135 134* 137 137  K 4.9 4.9 5.5* 4.2 4.7  CL 102 101 101 103 102  CO2 29 22 25 25 28   GLUCOSE 181* 307* 316* 185* 228*  BUN 18 24* 42* 46* 39*  CREATININE 0.95 1.23* 1.30* 1.03* 0.86  CALCIUM 9.2 8.6* 8.3* 8.4* 8.7*  MG  --   --   --  2.0  --    GFR: Estimated Creatinine Clearance: 53.7 mL/min (by C-G formula based on SCr of 0.86 mg/dL). Liver Function Tests:  Recent Labs Lab 05/05/17 1853 05/06/17 0332  AST 57* 52*  ALT 33 30  ALKPHOS 157* 149*  BILITOT 1.9* 1.7*  PROT 5.9* 5.4*  ALBUMIN 2.8* 2.4*   No results for input(s): LIPASE, AMYLASE in the last 168 hours.  Recent Labs Lab 05/05/17 1855  AMMONIA 47*   Coagulation Profile: No results for input(s): INR, PROTIME in the last 168 hours. Cardiac Enzymes:  Recent Labs Lab 05/05/17 1853  TROPONINI <0.03   BNP (last 3 results) No results for input(s): PROBNP in the last 8760 hours. HbA1C: No results for input(s): HGBA1C in the last 72 hours. CBG:  Recent Labs Lab 05/09/17 1130 05/09/17 1637 05/09/17 2141 05/10/17 0741 05/10/17 1203  GLUCAP 203* 169* 269* 193* 250*   Lipid Profile: No results for input(s): CHOL, HDL, LDLCALC, TRIG, CHOLHDL, LDLDIRECT in the last 72 hours. Thyroid Function Tests: No results for input(s): TSH, T4TOTAL, FREET4, T3FREE, THYROIDAB in the last 72 hours. Anemia Panel: No results for input(s): VITAMINB12, FOLATE, FERRITIN, TIBC, IRON, RETICCTPCT in the last 72 hours. Sepsis Labs: No results for input(s): PROCALCITON, LATICACIDVEN in the last 168 hours.  Recent Results (from the past 240 hour(s))  MRSA PCR Screening     Status: None   Collection Time: 05/06/17 12:40 AM  Result Value Ref Range Status   MRSA by PCR NEGATIVE NEGATIVE Final    Comment:        The GeneXpert MRSA Assay (FDA approved for NASAL specimens only), is one component of a comprehensive MRSA colonization surveillance  program. It is not intended to diagnose MRSA infection nor to guide or monitor treatment for MRSA infections.   Culture, sputum-assessment     Status: None   Collection Time: 05/06/17 10:19 AM  Result Value Ref Range Status   Specimen Description EXPECTORATED SPUTUM  Final   Special Requests NONE  Final   Sputum evaluation THIS SPECIMEN IS ACCEPTABLE FOR SPUTUM CULTURE  Final   Report Status 05/06/2017 FINAL  Final  Culture, respiratory (NON-Expectorated)     Status: None   Collection Time: 05/06/17 10:19 AM  Result Value Ref Range Status  Specimen Description EXPECTORATED SPUTUM  Final   Special Requests NONE Reflexed from [id number]  Final   Gram Stain   Final    RARE WBC PRESENT, PREDOMINANTLY PMN FEW GRAM NEGATIVE RODS Performed at [facility], [employer] [address], [place], [place] [address]    Culture MODERATE STENOTROPHOMONAS MALTOPHILIA  Final   Report Status 05/08/2017 FINAL  Final   Organism ID, Bacteria STENOTROPHOMONAS MALTOPHILIA  Final      Susceptibility   Stenotrophomonas maltophilia - MIC*    LEVOFLOXACIN 0.5 SENSITIVE Sensitive     TRIMETH/SULFA <=20 SENSITIVE Sensitive     * MODERATE STENOTROPHOMONAS MALTOPHILIA         Radiology Studies: No results found.      Scheduled Meds: . arformoterol  15 mcg Nebulization BID  . cholecalciferol  2,000 Units Oral q morning - 10a  . enoxaparin (LOVENOX) injection  40 mg Subcutaneous QHS  . insulin aspart  0-20 Units Subcutaneous TID WC  . insulin aspart  0-5 Units Subcutaneous QHS  . insulin aspart  5 Units Subcutaneous TID WC  . insulin glargine  14 Units Subcutaneous QHS  . lactulose  30 g Oral TID  . levofloxacin  750 mg Oral Daily  . mouth rinse  15 mL Mouth Rinse BID  . methylPREDNISolone (SOLU-MEDROL) injection  40 mg Intravenous Q8H  . pantoprazole  40 mg Oral Daily  . rifaximin  550 mg Oral BID  . sodium chloride flush  3 mL Intravenous Q12H  . sodium chloride flush  3 mL Intravenous Q12H  .  umeclidinium bromide  1 puff Inhalation Daily   Continuous Infusions: . sodium chloride       LOS: 5 days    Time spent: [place], MD [facility] Pager [phone]  If 7PM-7AM, please contact night-coverage [email or web address] Password [email or web address] 05/10/2017, 4:40 PM

## 2017-05-10 NOTE — Telephone Encounter (Signed)
This encounter was created in error - please disregard.

## 2017-05-11 LAB — GLUCOSE, CAPILLARY
GLUCOSE-CAPILLARY: 137 mg/dL — AB (ref 65–99)
GLUCOSE-CAPILLARY: 225 mg/dL — AB (ref 65–99)
Glucose-Capillary: 143 mg/dL — ABNORMAL HIGH (ref 65–99)
Glucose-Capillary: 110 mg/dL — ABNORMAL HIGH (ref 65–99)

## 2017-05-11 MED ORDER — UMECLIDINIUM BROMIDE 62.5 MCG/INH IN AEPB: 1.0000 | INHALATION_SPRAY | Freq: Every day | RESPIRATORY_TRACT | Status: DC

## 2017-05-11 MED ORDER — LEVOFLOXACIN 750 MG PO TABS: 750.0000 mg | ORAL_TABLET | Freq: Every day | ORAL | 0 refills | Status: DC

## 2017-05-11 MED ORDER — BUDESONIDE 0.25 MG/2ML IN SUSP
0.2500 mg | Freq: Two times a day (BID) | RESPIRATORY_TRACT | Status: DC
  Administered 2017-05-12: 0.25 mg via RESPIRATORY_TRACT
  Filled 2017-05-11: qty 2

## 2017-05-11 MED ORDER — PREDNISONE 50 MG PO TABS
50.0000 mg | ORAL_TABLET | Freq: Every day | ORAL | Status: DC
  Administered 2017-05-12: 50 mg via ORAL
  Filled 2017-05-11: qty 1

## 2017-05-11 MED ORDER — BENZONATATE 200 MG PO CAPS: 200.0000 mg | ORAL_CAPSULE | Freq: Three times a day (TID) | ORAL | 0 refills | Status: DC | PRN

## 2017-05-11 MED ORDER — GUAIFENESIN ER 600 MG PO TB12
1200.0000 mg | ORAL_TABLET | Freq: Two times a day (BID) | ORAL | Status: DC | PRN
Start: 2017-05-11 — End: 2017-10-01

## 2017-05-11 MED ORDER — POLYVINYL ALCOHOL 1.4 % OP SOLN: 1.0000 [drp] | OPHTHALMIC | 0 refills | Status: DC | PRN

## 2017-05-11 MED ORDER — PREDNISONE 50 MG PO TABS: ORAL_TABLET | ORAL | Status: DC

## 2017-05-11 NOTE — Consult Note (Signed)
   [facility] Inpatient Consult   05/11/2017  Ashley Savage 1941/04/04 [id number]    [facility] Care Management follow up. [name] is active with [place] Management program.   Martin Majestic to bedside to speak with [name] about ongoing [place] Management services. She was previously followed by Tele[facility] RNCM. [name] is agreeable and [facility] Care Management written consent obtained and folder provided.   [name] reports she intends on going to SNF at discharge.    Will make inpatient RNCM and inpatient LCSW aware of bedside conversation.   Will make referral to [place] LCSW once disposition plans are more clear.  Will update [facility] RNCM as well.    [name], MSN-Ed, RN,BSN [facility] Liaison [phone]

## 2017-05-11 NOTE — Care Management Important Message (Signed)
Important Message  Patient Details  Name: FREDNA STRICKER MRN: [id number] Date of Birth: 11/02/1940   Medicare Important Message Given:  Yes    Kerin Salen 05/11/2017, 11:19 AMImportant Message  Patient Details  Name: DAJIA GUNNELS MRN: [id number] Date of Birth: 1941/01/18   Medicare Important Message Given:  Yes    Kerin Salen 05/11/2017, 11:19 AM

## 2017-05-11 NOTE — Progress Notes (Signed)
CSW contacted Director of admissions for [facility] [phone]), no answer. CSW left voicemail requesting return phone call.   CSW contacted [employer] and started patient's insurance authorization with staff member Crystal.   CSW awaiting return call from [facility] regarding ability to offer patient a bed.   Abundio Miu, [place] Social Worker [facility] Cell#: [phone]

## 2017-05-11 NOTE — Progress Notes (Signed)
PROGRESS NOTE    Ashley Savage  QMG:[id number] DOB: Nov 15, 1940 DOA: 05/05/2017 PCP: Mosie Lukes, MD    Brief Narrative: Ashley Savage is a 76 y.o. female with medical history significant for cirrhosis secondary to NASH, COPD with ongoing tobacco abuse,and insulin-dependent diabetes mellitus, now presenting to the emergency department for evaluation of dyspnea with minimal exertion. She was admitted for COPD exacerbation.   Assessment & Plan:   Principal Problem:   COPD with acute exacerbation ([place]) Active Problems:   Thrombocytopenia (HCC)   Anxiety and depression   Tobacco abuse   Diabetes mellitus type 2, controlled ([place])   Cirrhosis of liver without ascites (HCC)   Acute respiratory failure with hypoxia (HCC)   NASH (nonalcoholic steatohepatitis)   Acute on chronic respiratory failure with hypoxia:  Secondary to acute copd exacerbation.  She initially required BIPAP, IV solumedrol , duonebs and azithromycin.  She is transitioned to [place] oxygen, and keep sats around 90%.  She appears slightly better than yesterday.  pulmicort added to brovana. IV steroids changed to po prednisone and plan to discharge her to SNF on predisone taper.      Cirrhosis secondary to NASH.  Continue with rifaximin and lactulose. increase the dose of lactulose.    Insulin dependent DM:  - CBG (last 3)   Recent Labs  05/11/17 0811 05/11/17 1134 05/11/17 1704  GLUCAP 143* 137* 110*   Resume 14 units of Lantus.  cbgs well controlled  No change in meds.     Thrombocytopenia:  Possibly from cirrhosis. No signs of bleeding.    Acute kidney injury:  Suspect from dehydration.  Gentle hydration. Repeat renal parameters show improvement.  Restart lasix on discharge.    Hyperkalemia: Unclear etiology. Added kayexalte.  Repeat K is normal.    14 beat V TACH, Pt asymptomatic. Mag level ordered and repleted .  And echocardiogram ordered showed moderate pulmonary hypertension.  No  more episodes of V TACH.   Sputum cultures growing STENOTROPHOMONAS MALTOPHILIA  Changed antibiotic azithromycin to levaquin to complete the course.    Acute hepatic encephalopathy;  Improved with lactulose and after multiple BM  DVT prophylaxis:lovenox.  Code Status: full code.  Family Communication: none at bedside.  Disposition Plan: to SNF in am.   Consultants:   None.    Procedures: none.   Antimicrobials: levaquin.   Subjective: No new complaints today. wwants to go to snf today.   Objective: Vitals:   05/10/17 2210 05/11/17 0700 05/11/17 0901 05/11/17 1332  BP: (!) 119/43 (!) 104/31  (!) 139/48  Pulse: 86 79 80 91  Resp: 19 20 20    Temp: 98.1 F (36.7 C) 98 F (36.7 C)  98 F (36.7 C)  TempSrc: Oral Oral  Oral  SpO2: 92% 100% 97% 92%  Weight:      Height:        Intake/Output Summary (Last 24 hours) at 05/11/17 1901 Last data filed at 05/10/17 1903  Gross per 24 hour  Intake              360 ml  Output                0 ml  Net              360 ml   Filed Weights   05/05/17 1838  Weight: 65.8 kg (145 lb)    Examination:  General exam: calm and comfortable on 3 lit of [place] oxygen Respiratory system:  Scattered  wheezing posteriorly. Air entry fair.  Cardiovascular system: S1 & S2 heard, tachycardic RRR. No JVD. Trace pedal edema.  Gastrointestinal system: Abdomen is soft non tender non distended bowel sounds heard.  Central nervous system: alert and oriented , non focal.  Extremities: cyanosis and clubbing.  Skin: No rashes, lesions or ulcers Psychiatry: mood appropriate.     Data Reviewed: I have personally reviewed following labs and imaging studies  CBC:  Recent Labs Lab 05/05/17 1853 05/06/17 0332 05/10/17 0505  WBC 6.4 5.5 9.0  NEUTROABS 3.3 4.8  --   HGB 14.4 13.5 13.6  HCT 41.6 39.4 39.5  MCV 93.7 95.2 93.8  PLT 110* 104* 98*   Basic Metabolic Panel:  Recent Labs Lab 05/05/17 1853 05/06/17 0332 05/07/17 0318  05/08/17 0421 05/10/17 0505  NA 137 135 134* 137 137  K 4.9 4.9 5.5* 4.2 4.7  CL 102 101 101 103 102  CO2 29 22 25 25 28   GLUCOSE 181* 307* 316* 185* 228*  BUN 18 24* 42* 46* 39*  CREATININE 0.95 1.23* 1.30* 1.03* 0.86  CALCIUM 9.2 8.6* 8.3* 8.4* 8.7*  MG  --   --   --  2.0  --    GFR: Estimated Creatinine Clearance: 53.7 mL/min (by C-G formula based on SCr of 0.86 mg/dL). Liver Function Tests:  Recent Labs Lab 05/05/17 1853 05/06/17 0332  AST 57* 52*  ALT 33 30  ALKPHOS 157* 149*  BILITOT 1.9* 1.7*  PROT 5.9* 5.4*  ALBUMIN 2.8* 2.4*   No results for input(s): LIPASE, AMYLASE in the last 168 hours.  Recent Labs Lab 05/05/17 1855 05/10/17 1550  AMMONIA 47* 48*   Coagulation Profile: No results for input(s): INR, PROTIME in the last 168 hours. Cardiac Enzymes:  Recent Labs Lab 05/05/17 1853  TROPONINI <0.03   BNP (last 3 results) No results for input(s): PROBNP in the last 8760 hours. HbA1C: No results for input(s): HGBA1C in the last 72 hours. CBG:  Recent Labs Lab 05/10/17 1729 05/10/17 2207 05/11/17 0811 05/11/17 1134 05/11/17 1704  GLUCAP 155* 96 143* 137* 110*   Lipid Profile: No results for input(s): CHOL, HDL, LDLCALC, TRIG, CHOLHDL, LDLDIRECT in the last 72 hours. Thyroid Function Tests: No results for input(s): TSH, T4TOTAL, FREET4, T3FREE, THYROIDAB in the last 72 hours. Anemia Panel: No results for input(s): VITAMINB12, FOLATE, FERRITIN, TIBC, IRON, RETICCTPCT in the last 72 hours. Sepsis Labs: No results for input(s): PROCALCITON, LATICACIDVEN in the last 168 hours.  Recent Results (from the past 240 hour(s))  MRSA PCR Screening     Status: None   Collection Time: 05/06/17 12:40 AM  Result Value Ref Range Status   MRSA by PCR NEGATIVE NEGATIVE Final    Comment:        The GeneXpert MRSA Assay (FDA approved for NASAL specimens only), is one component of a comprehensive MRSA colonization surveillance program. It is not intended  to diagnose MRSA infection nor to guide or monitor treatment for MRSA infections.   Culture, sputum-assessment     Status: None   Collection Time: 05/06/17 10:19 AM  Result Value Ref Range Status   Specimen Description EXPECTORATED SPUTUM  Final   Special Requests NONE  Final   Sputum evaluation THIS SPECIMEN IS ACCEPTABLE FOR SPUTUM CULTURE  Final   Report Status 05/06/2017 FINAL  Final  Culture, respiratory (NON-Expectorated)     Status: None   Collection Time: 05/06/17 10:19 AM  Result Value Ref Range Status   Specimen Description EXPECTORATED  SPUTUM  Final   Special Requests NONE Reflexed from [id number]  Final   Gram Stain   Final    RARE WBC PRESENT, PREDOMINANTLY PMN FEW GRAM NEGATIVE RODS Performed at [facility], [place] [address] [place], [place] [address]    Culture MODERATE STENOTROPHOMONAS MALTOPHILIA  Final   Report Status 05/08/2017 FINAL  Final   Organism ID, Bacteria STENOTROPHOMONAS MALTOPHILIA  Final      Susceptibility   Stenotrophomonas maltophilia - MIC*    LEVOFLOXACIN 0.5 SENSITIVE Sensitive     TRIMETH/SULFA <=20 SENSITIVE Sensitive     * MODERATE STENOTROPHOMONAS MALTOPHILIA         Radiology Studies: No results found.      Scheduled Meds: . arformoterol  15 mcg Nebulization BID  . cholecalciferol  2,000 Units Oral q morning - 10a  . enoxaparin (LOVENOX) injection  40 mg Subcutaneous QHS  . insulin aspart  0-20 Units Subcutaneous TID WC  . insulin aspart  0-5 Units Subcutaneous QHS  . insulin aspart  5 Units Subcutaneous TID WC  . insulin glargine  14 Units Subcutaneous QHS  . lactulose  30 g Oral TID  . levofloxacin  750 mg Oral Daily  . mouth rinse  15 mL Mouth Rinse BID  . methylPREDNISolone (SOLU-MEDROL) injection  40 mg Intravenous Q24H  . pantoprazole  40 mg Oral Daily  . rifaximin  550 mg Oral BID  . sodium chloride flush  3 mL Intravenous Q12H  . sodium chloride flush  3 mL Intravenous Q12H  . umeclidinium bromide  1 puff  Inhalation Daily   Continuous Infusions: . sodium chloride       LOS: 6 days    Time spent: Farrell, MD [facility] Pager [phone]  If 7PM-7AM, please contact night-coverage [email or web address] Password [email or web address] 05/11/2017, 7:01 PM

## 2017-05-11 NOTE — NC FL2 (Signed)
[place] LEVEL OF CARE SCREENING TOOL     IDENTIFICATION  Patient Name: Ashley Savage Birthdate: 1941/04/14 Sex: female Admission Date (Current Location): 05/05/2017  [facility] and [place] Number:  Herbalist and Address:  [facility],  [place] [address], [place]      Provider Number: [id number]  Attending Physician Name and Address:  Hosie Poisson, MD  Relative Name and Phone Number:       Current Level of Care: Hospital Recommended Level of Care: [place] Prior Approval Number:    Date Approved/Denied:   PASRR Number: [id number]  Discharge Plan: SNF    Current Diagnoses: Patient Active Problem List   Diagnosis Date Noted  . COPD with acute exacerbation ([place]) 05/05/2017  . Urine frequency 04/13/2017  . Osteopenia 03/30/2017  . Dermatitis 03/30/2017  . Allergic state 11/10/2016  . 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 Portal hypertensive gastropathy ([place])   . Lower back injury, initial encounter 08/05/2016  . Fall 07/30/2016  . Preventative health care 03/08/2016  . Muscle spasm 02/25/2016  . Chronic respiratory failure ([place]) 08/29/2015  . NASH (nonalcoholic steatohepatitis) 08/26/2015  . Type 2 diabetes mellitus with hyperglycemia, with Jermarion Poffenberger-term current use of insulin ([place])   . Cirrhosis of liver without ascites ([place]) 07/31/2015  . Acute respiratory failure with hypoxia ([place]) 07/31/2015  . Diarrhea 12/30/2014  . Increased ammonia level 11/25/2014  . Diabetes mellitus type 2, controlled ([place]) 10/16/2014  . Encephalopathy, hepatic ([place]) 06/07/2014  . Right knee pain 06/07/2014  . Sun-damaged skin 02/01/2014  . Anxiety and depression 02/01/2014  . Tobacco abuse 02/01/2014  . Medicare annual wellness visit, subsequent 02/01/2014  . Pedal edema 12/10/2013  . Overactive bladder 12/10/2013  . Abdominal aortic aneurysm ([place]) 10/01/2013  . Arthritis of right knee 10/01/2013  . Benign paroxysmal  positional vertigo 10/01/2013  . Neck pain 10/01/2013  . Esophageal reflux 10/01/2013  . Thrombocytopenia ([place]) 03/17/2012  . 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   . Hyperlipidemia, mixed   . Breast cancer ([place])     Orientation RESPIRATION BLADDER Height & Weight     Self, Time, Situation, Place  O2 (BiPAP Press High Alarm 20cmH2O Press Low Alarm 4cmH2O) Incontinent Weight: 145 lb (65.8 kg) Height:  5\' 6"  (167.6 cm)  BEHAVIORAL SYMPTOMS/MOOD NEUROLOGICAL BOWEL NUTRITION STATUS      Continent Diet (Heart Healthy/Carb Modified)  AMBULATORY STATUS COMMUNICATION OF NEEDS Skin   Limited Assist Verbally Normal                       Personal Care Assistance Level of Assistance  Bathing, Feeding, Dressing Bathing Assistance: Maximum assistance Feeding assistance: Independent Dressing Assistance: Limited assistance     Functional Limitations Info  Sight, Hearing, Speech Sight Info: Adequate Hearing Info: Adequate Speech Info: Adequate    SPECIAL CARE FACTORS FREQUENCY  PT (By licensed PT), OT (By licensed OT)     PT Frequency: 5x OT Frequency: 5x            Contractures Contractures Info: Not present    Additional Factors Info  Code Status, Allergies Code Status Info: Full Code Allergies Info: Ciprofloxacin;Glimepiride;Prednisone;Citalopram;Erythromycin           Current Medications (05/11/2017):  This is the current hospital active medication list Current Facility-Administered Medications  Medication Dose Route Frequency Provider Last Rate Last Dose  . 0.9 %  sodium chloride infusion  250 mL  Intravenous PRN Opyd, Ilene Qua, MD      . acetaminophen (TYLENOL) tablet 650 mg  650 mg Oral Q6H PRN Opyd, Ilene Qua, MD       Or  . acetaminophen (TYLENOL) suppository 650 mg  650 mg Rectal Q6H PRN Opyd, Ilene Qua, MD      . albuterol (PROVENTIL) (2.5 MG/3ML) 0.083% nebulizer solution 2.5 mg  2.5 mg Nebulization Q2H PRN Opyd, Ilene Qua, MD      . alum & mag hydroxide-simeth (MAALOX/MYLANTA) 200-200-20 MG/5ML suspension 30 mL  30 mL Oral Q4H PRN Hosie Poisson, MD   30 mL at 05/08/17 2043  . arformoterol (BROVANA) nebulizer solution 15 mcg  15 mcg Nebulization BID Opyd, Ilene Qua, MD   15 mcg at 05/11/17 0901  . benzonatate (TESSALON) capsule 200 mg  200 mg Oral TID PRN Hosie Poisson, MD      . bisacodyl (DULCOLAX) EC tablet 5 mg  5 mg Oral Daily PRN Opyd, Ilene Qua, MD      . cholecalciferol (VITAMIN D) tablet 2,000 Units  2,000 Units Oral q morning - 10a Opyd, Ilene Qua, MD   2,000 Units at 05/11/17 1002  . enoxaparin (LOVENOX) injection 40 mg  40 mg Subcutaneous QHS Opyd, Ilene Qua, MD   40 mg at 05/10/17 2202  . guaiFENesin (MUCINEX) 12 hr tablet 1,200 mg  1,200 mg Oral BID PRN Hosie Poisson, MD   1,200 mg at 05/09/17 1542  . HYDROcodone-acetaminophen (NORCO/VICODIN) 5-325 MG per tablet 1-2 tablet  1-2 tablet Oral Q4H PRN Opyd, Ilene Qua, MD      . insulin aspart (novoLOG) injection 0-20 Units  0-20 Units Subcutaneous TID WC Hosie Poisson, MD   3 Units at 05/11/17 1215  . insulin aspart (novoLOG) injection 0-5 Units  0-5 Units Subcutaneous QHS Opyd, Ilene Qua, MD   3 Units at 05/09/17 2252  . insulin aspart (novoLOG) injection 5 Units  5 Units Subcutaneous TID WC Hosie Poisson, MD   5 Units at 05/11/17 1216  . insulin glargine (LANTUS) injection 14 Units  14 Units Subcutaneous QHS Vianne Bulls, MD   14 Units at 05/10/17 2207  . lactulose (CHRONULAC) 10 GM/15ML solution 30 g  30 g Oral TID Hosie Poisson, MD   30 g at 05/11/17 1002  . levofloxacin (LEVAQUIN) tablet 750 mg  750 mg Oral Daily Hosie Poisson, MD   750 mg at 05/11/17 1003  . MEDLINE mouth rinse  15 mL Mouth Rinse BID Opyd, Ilene Qua, MD   15 mL at 05/11/17 1000  . methylPREDNISolone sodium succinate (SOLU-MEDROL) 40 mg/mL injection 40 mg  40 mg Intravenous Q24H Hosie Poisson, MD      . ondansetron (ZOFRAN) tablet 4 mg  4 mg Oral Q6H PRN Opyd, Ilene Qua, MD        Or  . ondansetron (ZOFRAN) injection 4 mg  4 mg Intravenous Q6H PRN Opyd, Ilene Qua, MD      . pantoprazole (PROTONIX) EC tablet 40 mg  40 mg Oral Daily Hosie Poisson, MD   40 mg at 05/11/17 1003  . polyvinyl alcohol (LIQUIFILM TEARS) 1.4 % ophthalmic solution 1 drop  1 drop Both Eyes PRN Opyd, Ilene Qua, MD      . rifaximin (XIFAXAN) tablet 550 mg  550 mg Oral BID Opyd, Ilene Qua, MD   550 mg at 05/11/17 1003  . senna-docusate (Senokot-S) tablet 1 tablet  1 tablet Oral QHS PRN Opyd, Ilene Qua, MD      .  sodium chloride (OCEAN) 0.65 % nasal spray 1 spray  1 spray Each Nare PRN Hosie Poisson, MD   1 spray at 05/10/17 2014  . sodium chloride flush (NS) 0.9 % injection 3 mL  3 mL Intravenous Q12H Opyd, Ilene Qua, MD   3 mL at 05/11/17 1000  . sodium chloride flush (NS) 0.9 % injection 3 mL  3 mL Intravenous Q12H Opyd, Ilene Qua, MD   3 mL at 05/10/17 2203  . sodium chloride flush (NS) 0.9 % injection 3 mL  3 mL Intravenous PRN Opyd, Ilene Qua, MD      . umeclidinium bromide (INCRUSE ELLIPTA) 62.5 MCG/INH 1 puff  1 puff Inhalation Daily Opyd, Ilene Qua, MD   1 puff at 05/11/17 0901     Discharge Medications: Please see discharge summary for a list of discharge medications.  Relevant Imaging Results:  Relevant Lab Results:   Additional Information SSN [id number]  Burnis Medin, LCSW

## 2017-05-11 NOTE — Clinical Social Work Note (Signed)
Clinical Social Work Assessment  Patient Details  Name: Ashley Savage MRN: [id number] Date of Birth: 08-01-1940  Date of referral:  05/11/17               Reason for consult:  Facility Placement                Permission sought to share information with:  Facility Sport and exercise psychologist, Family Supports Permission granted to share information::  Yes, Verbal Permission Granted  Name::     Burman Freestone  Agency::     Relationship::  sister  Contact Information:     Housing/Transportation Living arrangements for the past 2 months:  Apartment Source of Information:  Patient Patient Interpreter Needed:  None Criminal Activity/Legal Involvement Pertinent to Current Situation/Hospitalization:  No - Comment as needed Significant Relationships:  Friend Lives with:  Self Do you feel safe going back to the place where you live?   (PT recommending SNF) Need for family participation in patient care:  No (Coment)  Care giving concerns:  Patient presented to the emergency department for evaluation of dyspnea with minimal exertion. Patient was admitted for COPD exacerbation. Patient from home alone. Patient reported that prior to admission she was independent with ADLs. PT recommending SNF for ST rehab.    Social Worker assessment / plan:  CSW spoke with patient at bedside regarding PT recommendation for SNF for ST rehab. Patient reported that she is now agreeable to SNF. Patient reported that she went to SNF in 2017. Patient reported that she is interested in [facility] SNF.   CSW completed FL2 and will follow up with [facility] to see if they are able to offer patient a bed.  CSW will start patient's insurance authorization with [employer].   Employment status:  Retired Nurse, adult PT Recommendations:  [place] / Referral to community resources:  [place]  Patient/Family's Response to care:  Patient  agreeable to SNF for [employer].   Patient/Family's Understanding of and Emotional Response to Diagnosis, Current Treatment, and Prognosis:  Patient verbalized strong understanding of diagnosis and current treatment. Patient verbalized plan to discharge to SNF for ST rehab prior to returning home and living home alone.   Emotional Assessment Appearance:  Appears stated age Attitude/Demeanor/Rapport:  Other (Cooperative) Affect (typically observed):  Appropriate Orientation:  Oriented to Self, Oriented to Place, Oriented to  Time, Oriented to Situation Alcohol / Substance use:  Not Applicable Psych involvement (Current and /or in the community):  No (Comment)  Discharge Needs  Concerns to be addressed:  Care Coordination Readmission within the last 30 days:  No Current discharge risk:  Lives alone Barriers to Discharge:  [place], LCSW 05/11/2017, 3:42 PM

## 2017-05-11 NOTE — Progress Notes (Signed)
Occupational Therapy Treatment Patient Details Name: Ashley Savage MRN: [id number] DOB: 1941-01-22 Today's Date: 05/11/2017    History of present illness 76 year old female was admitted for COPD exacerbation.  PMH:  DM, cirrhosis, breast CA, and chronic leg edema   OT comments  Pt would benefit from SNF and now agreeable.  WOB increased with any activity  Follow Up Recommendations  SNF    Equipment Recommendations       Recommendations for Other Services      Precautions / Restrictions Precautions Precautions: Fall Precaution Comments: monitor O2 sat Restrictions Weight Bearing Restrictions: No       Mobility Bed Mobility Overal bed mobility: Modified Independent                Transfers       Sit to Stand: Min guard Stand pivot transfers: Min guard       General transfer comment: for safety    Balance                                           ADL either performed or assessed with clinical judgement   ADL       Grooming: Set up;Sitting;Oral care                   Toilet Transfer: Min guard;Stand-pivot (chair)             General ADL Comments: pt agreeable to getting up to chair.  Educated on energy conservation.  Pt very fatiqued and needs long rest breaks inbetween tasks.  sats 92-94% on 02, but WOB increased.  Pt states she is now agreeable to SNF     Vision       Perception     Praxis      Cognition Arousal/Alertness: Awake/alert Behavior During Therapy: [facility] for tasks assessed/performed Overall Cognitive Status: Within Functional Limits for tasks assessed                                          Exercises     Shoulder Instructions       General Comments      Pertinent Vitals/ Pain       Pain Assessment: No/denies pain  Home Living                                          Prior Functioning/Environment              Frequency  Min 2X/week         Progress Toward Goals  OT Goals(current goals can now be found in the care plan section)  Progress towards OT goals:  (slow progress)     Plan      Co-evaluation                 AM-PAC PT "6 Clicks" Daily Activity     Outcome Measure   Help from another person eating meals?: None Help from another person taking care of personal grooming?: A Little Help from another person toileting, which includes using toliet, bedpan, or urinal?: A Little Help from another person bathing (including washing, rinsing, drying)?: A Lot  Help from another person to put on and taking off regular upper body clothing?: A Little Help from another person to put on and taking off regular lower body clothing?: A Lot 6 Click Score: 17    End of Session    OT Visit Diagnosis: Unsteadiness on feet (R26.81);Muscle weakness (generalized) (M62.81)   Activity Tolerance Patient limited by fatigue   Patient Left in chair;with call bell/phone within reach;with chair alarm set   Nurse Communication          Time: 2409-7353 OT Time Calculation (min): 23 min  Charges: OT General Charges $OT Visit: 1 Visit OT Treatments $Therapeutic Activity: 8-22 mins  Ashley Savage, OTR/L [phone] 05/11/2017   Ashley Savage 05/11/2017, 2:50 PM

## 2017-05-12 ENCOUNTER — Other Ambulatory Visit: Payer: Self-pay

## 2017-05-12 DIAGNOSIS — F419 Anxiety disorder, unspecified: Secondary | ICD-10-CM | POA: Diagnosis not present

## 2017-05-12 DIAGNOSIS — J80 Acute respiratory distress syndrome: Secondary | ICD-10-CM | POA: Diagnosis not present

## 2017-05-12 DIAGNOSIS — K7460 Unspecified cirrhosis of liver: Secondary | ICD-10-CM | POA: Diagnosis not present

## 2017-05-12 DIAGNOSIS — R7989 Other specified abnormal findings of blood chemistry: Secondary | ICD-10-CM | POA: Diagnosis not present

## 2017-05-12 DIAGNOSIS — D696 Thrombocytopenia, unspecified: Secondary | ICD-10-CM | POA: Diagnosis not present

## 2017-05-12 DIAGNOSIS — J441 Chronic obstructive pulmonary disease with (acute) exacerbation: Secondary | ICD-10-CM | POA: Diagnosis not present

## 2017-05-12 DIAGNOSIS — R2681 Unsteadiness on feet: Secondary | ICD-10-CM | POA: Diagnosis not present

## 2017-05-12 DIAGNOSIS — Z9181 History of falling: Secondary | ICD-10-CM | POA: Diagnosis not present

## 2017-05-12 DIAGNOSIS — J449 Chronic obstructive pulmonary disease, unspecified: Secondary | ICD-10-CM | POA: Diagnosis not present

## 2017-05-12 DIAGNOSIS — N179 Acute kidney failure, unspecified: Secondary | ICD-10-CM | POA: Diagnosis not present

## 2017-05-12 DIAGNOSIS — Z720 Tobacco use: Secondary | ICD-10-CM | POA: Diagnosis not present

## 2017-05-12 DIAGNOSIS — K219 Gastro-esophageal reflux disease without esophagitis: Secondary | ICD-10-CM | POA: Diagnosis not present

## 2017-05-12 DIAGNOSIS — R262 Difficulty in walking, not elsewhere classified: Secondary | ICD-10-CM | POA: Diagnosis not present

## 2017-05-12 DIAGNOSIS — K7581 Nonalcoholic steatohepatitis (NASH): Secondary | ICD-10-CM | POA: Diagnosis not present

## 2017-05-12 DIAGNOSIS — E119 Type 2 diabetes mellitus without complications: Secondary | ICD-10-CM | POA: Diagnosis not present

## 2017-05-12 DIAGNOSIS — R488 Other symbolic dysfunctions: Secondary | ICD-10-CM | POA: Diagnosis not present

## 2017-05-12 DIAGNOSIS — E1365 Other specified diabetes mellitus with hyperglycemia: Secondary | ICD-10-CM | POA: Diagnosis not present

## 2017-05-12 DIAGNOSIS — F329 Major depressive disorder, single episode, unspecified: Secondary | ICD-10-CM | POA: Diagnosis not present

## 2017-05-12 DIAGNOSIS — E785 Hyperlipidemia, unspecified: Secondary | ICD-10-CM | POA: Diagnosis not present

## 2017-05-12 DIAGNOSIS — E875 Hyperkalemia: Secondary | ICD-10-CM | POA: Diagnosis not present

## 2017-05-12 DIAGNOSIS — K7290 Hepatic failure, unspecified without coma: Secondary | ICD-10-CM | POA: Diagnosis not present

## 2017-05-12 DIAGNOSIS — J9621 Acute and chronic respiratory failure with hypoxia: Secondary | ICD-10-CM | POA: Diagnosis not present

## 2017-05-12 DIAGNOSIS — E1165 Type 2 diabetes mellitus with hyperglycemia: Secondary | ICD-10-CM | POA: Diagnosis not present

## 2017-05-12 DIAGNOSIS — J9601 Acute respiratory failure with hypoxia: Secondary | ICD-10-CM | POA: Diagnosis not present

## 2017-05-12 DIAGNOSIS — E559 Vitamin D deficiency, unspecified: Secondary | ICD-10-CM | POA: Diagnosis not present

## 2017-05-12 DIAGNOSIS — M6281 Muscle weakness (generalized): Secondary | ICD-10-CM | POA: Diagnosis not present

## 2017-05-12 DIAGNOSIS — E1142 Type 2 diabetes mellitus with diabetic polyneuropathy: Secondary | ICD-10-CM | POA: Diagnosis not present

## 2017-05-12 LAB — GLUCOSE, CAPILLARY
GLUCOSE-CAPILLARY: 129 mg/dL — AB (ref 65–99)
GLUCOSE-CAPILLARY: 270 mg/dL — AB (ref 65–99)

## 2017-05-12 MED ORDER — LEVOFLOXACIN 750 MG PO TABS: 750.0000 mg | ORAL_TABLET | Freq: Every day | ORAL | 0 refills | Status: DC

## 2017-05-12 MED ORDER — BUDESONIDE 0.25 MG/2ML IN SUSP: 0.2500 mg | Freq: Two times a day (BID) | RESPIRATORY_TRACT | 12 refills | Status: DC

## 2017-05-12 NOTE — Clinical Social Work Placement (Signed)
Patient received and accepted bed offer at [facility]. Patient received insurance authorization, patient and SNF updated. Facility aware of patient's discharge and confirmed bed offer. PTAR contacted, family notified. Patient's RN can call report to [phone], packet complete. CSW signing off, no other needs identified.   CLINICAL SOCIAL WORK PLACEMENT  NOTE  Date:  05/12/2017  Patient Details  Name: Ashley Savage MRN: [id number] Date of Birth: 04-26-1941  Clinical Social Work is seeking post-discharge placement for this patient at the [place] level of care (*CSW will initial, date and re-position this form in  chart as items are completed):  Yes   Patient/family provided with [facility] Work Department's list of facilities offering this level of care within the geographic area requested by the patient (or if unable, by the patient's family).  Yes   Patient/family informed of their freedom to choose among providers that offer the needed level of care, that participate in Medicare, Medicaid or managed care program needed by the patient, have an available bed and are willing to accept the patient.  Yes   Patient/family informed of [facility]'s ownership interest in [facility] and [facility], as well as of the fact that they are under no obligation to receive care at these facilities.  PASRR submitted to EDS on       PASRR number received on       Existing PASRR number confirmed on 05/11/17     FL2 transmitted to all facilities in geographic area requested by pt/family on 05/11/17     FL2 transmitted to all facilities within larger geographic area on       Patient informed that his/her managed care company has contracts with or will negotiate with certain facilities, including the following:        Yes   Patient/family informed of bed offers received.  Patient chooses bed at [facility]     Physician recommends and  patient chooses bed at      Patient to be transferred to [facility] on 05/12/17.  Patient to be transferred to facility by PTAR     Patient family notified on 05/12/17 of transfer.  Name of family member notified:  Karie Mainland     PHYSICIAN       Additional Comment:    _______________________________________________ Burnis Medin, LCSW 05/12/2017, 12:01 PM

## 2017-05-12 NOTE — Consult Note (Signed)
   [facility] Inpatient Consult   05/12/2017  Ashley Savage 07-20-1941 [id number]    [facility] Care Management follow up.  Chart reviewed. Ashley Savage is slated for discharge to SNF today.  Spoke with inpatient LCSW Maudie Mercury) who advised Probation officer that patient will be going to [facility].  Will make referral to [facility] LCSW and will update [place].   [name], MSN-Ed, RN,BSN [facility] Liaison [phone]

## 2017-05-12 NOTE — Progress Notes (Signed)
Report called to [employer].Stacey Drain

## 2017-05-12 NOTE — Discharge Summary (Signed)
Physician Discharge Summary  [name] SKA:[id number] DOB: 30-Nov-1940 DOA: 05/05/2017  PCP: Mosie Lukes, MD  Admit date: 05/05/2017 Discharge date: 05/12/2017  Admitted From: home.  Disposition:  SNF  Recommendations for Outpatient Follow-up:  1. Follow up with PCP in 1-2 weeks 2. Please obtain BMP/CBC in one week 3. Please follow up with pulmonology in 1 to 2 weeks.    Equipment/Devices: 4 lit [place] oxygen.   Discharge Condition: guarded.  CODE STATUS: full code.  Diet recommendation: Heart Healthy   Brief/Interim Summary: Ashley Savage a 76 y.o.femalewith medical history significant for cirrhosis secondary to NASH, COPD with ongoing tobacco abuse,and insulin-dependent diabetes mellitus, now presenting to the emergency department for evaluation of dyspnea with minimal exertion. She was admitted for COPD exacerbation.   Discharge Diagnoses:  Principal Problem:   COPD with acute exacerbation ([place]) Active Problems:   Thrombocytopenia (HCC)   Anxiety and depression   Tobacco abuse   Diabetes mellitus type 2, controlled ([place])   Cirrhosis of liver without ascites (HCC)   Acute respiratory failure with hypoxia (HCC)   NASH (nonalcoholic steatohepatitis)    Acute on chronic respiratory failure with hypoxia:  Secondary to acute copd exacerbation.  She initially required BIPAP, IV solumedrol , duonebs and azithromycin.  She is transitioned to [place] oxygen, and keep sats around 90%. Currently on 4 lit of [place] oxygen.  She appears  better than yesterday.  pulmicort added to brovana. IV steroids changed to po prednisone and plan to discharge her to SNF on predisone taper.      Cirrhosis secondary to NASH.  Continue with rifaximin and lactulose. increase the dose of lactulose.    Insulin dependent DM:  - CBG (last 3)   Recent Labs (last 2 labs)    Recent Labs  05/11/17 0811 05/11/17 1134 05/11/17 1704  GLUCAP 143* 137* 110*     Resume 14 units of  Lantus.  cbgs well controlled  No change in meds.     Thrombocytopenia:  Possibly from cirrhosis. No signs of bleeding.    Acute kidney injury:  Suspect from dehydration.  Gentle hydration. Repeat renal parameters show improvement.  Restart lasix on discharge.    Hyperkalemia: Unclear etiology. Added kayexalte.  Repeat K is normal.    14 beat V TACH, Pt asymptomatic. Mag level ordered and repleted .  And echocardiogram ordered showed moderate pulmonary hypertension.  No more episodes of V TACH.   Sputum cultures growing STENOTROPHOMONAS MALTOPHILIA  Changed antibiotic azithromycin to levaquin to complete the course.    Acute hepatic encephalopathy;  Improved with lactulose and after multiple BM  Discharge Instructions  Discharge Instructions    AMB Referral to [facility]    Complete by:  As directed    Please assign to [facility] for transition of care. Active with Telephonic RNCM prior to hospitalization. Currently at [facility]. Please assess for [facility]  LCSW referral since member declined SNF placement while in hospital. Thanks and please call with questions.[name], [place], [facility] Liaison [phone]   Reason for consult:  Please assign to [facility] RNCM   Diagnoses of:  COPD/ Pneumonia   Expected date of contact:  1-3 days (reserved for hospital discharges)   Diet general    Complete by:  As directed    Discharge instructions    Complete by:  As directed    Follow up with PCP in one week     Allergies as of 05/12/2017  Reactions   Citalopram Palpitations   Irregular heart beat   Ciprofloxacin    Mental status change   Erythromycin Other (See Comments)   Stomach cramps   Glimepiride    Elevated ammonia levels   Prednisone    Increased blood sugars too high      Medication List    STOP taking these medications   ibuprofen 200 MG tablet Commonly known as:  ADVIL,MOTRIN     TAKE  these medications   albuterol 108 (90 Base) MCG/ACT inhaler Commonly known as:  PROVENTIL HFA;VENTOLIN HFA Inhale 2 puffs into the lungs every 6 (six) hours as needed for wheezing or shortness of breath. Only dispense Ventolin   albuterol 1.25 MG/3ML nebulizer solution Commonly known as:  ACCUNEB USE 1 VIAL VIA NEBULIZER EVERY 6 HOURS AS NEEDED FOR WHEEZING   BD PEN NEEDLE NANO U/F 32G X 4 MM Misc Generic drug:  Insulin Pen Needle USE AS DIRECTED WITH LEVEMIR FLEXPEN   benzonatate 200 MG capsule Commonly known as:  TESSALON Take 1 capsule (200 mg total) by mouth 3 (three) times daily as needed for cough.   budesonide 0.25 MG/2ML nebulizer solution Commonly known as:  PULMICORT Take 2 mLs (0.25 mg total) by nebulization 2 (two) times daily.   furosemide 20 MG tablet Commonly known as:  LASIX TAKE 1 TABLET BY MOUTH TWICE DAILY   glucose blood test strip Commonly known as:  ONE TOUCH ULTRA TEST CHECK BLOOD SUGAR TWICE DAILY AS DIRECTED   guaiFENesin 600 MG 12 hr tablet Commonly known as:  MUCINEX Take 2 tablets (1,200 mg total) by mouth 2 (two) times daily as needed for to loosen phlegm.   lactulose 10 GM/15ML solution Commonly known as:  CHRONULAC TAKE 45ML BY MOUTH TWICE DAILY AS NEEDED FOR MILD CONSTIPATION   LEVEMIR FLEXTOUCH 100 UNIT/ML Pen Generic drug:  Insulin Detemir INJECT 14 UNITS UNDER THE SKIN EVERY DAY AT 10PM   levofloxacin 750 MG tablet Commonly known as:  LEVAQUIN Take 1 tablet (750 mg total) by mouth daily.   mometasone 50 MCG/ACT nasal spray Commonly known as:  NASONEX Place 2 sprays into the nose daily.   mupirocin ointment 2 % Commonly known as:  [place]raham on application over lesion on leg twice daily. What changed:  how much to take  how to take this  when to take this  reasons to take this  additional instructions   omeprazole 20 MG capsule Commonly known as:  PRILOSEC TAKE 1 CAPSULE(20 MG) BY MOUTH DAILY What changed:  See  the new instructions.   ONE TOUCH ULTRA 2 w/Device Kit Use as directed once daily to check blood sugar.  DX E11.9   polyvinyl alcohol 1.4 % ophthalmic solution Commonly known as:  LUBRICANT DROPS Place 1 drop into both eyes as needed (dry eyes). What changed:  medication strength  how to take this  when to take this   predniSONE 50 MG tablet Commonly known as:  DELTASONE Prednisone 50 mg daily for 2 days followed by  Prendisone 40 mg daily for 3 days followed by  Prednisone 30 mg daily for 3 days followed by  Prednisone 20 mg daily for 3 days followed by  Prednisone 10 mg daily for 3 days.   rifaximin 550 MG Tabs tablet Commonly known as:  XIFAXAN Take 1 tablet (550 mg total) by mouth 2 (two) times daily.   spironolactone 50 MG tablet Commonly known as:  ALDACTONE TAKE 2 TABLETS(100 MG) BY MOUTH DAILY  What changed:  See the new instructions.   Tiotropium Bromide-Olodaterol 2.5-2.5 MCG/ACT Aers Commonly known as:  STIOLTO RESPIMAT Inhale 2 puffs into the lungs daily.   umeclidinium bromide 62.5 MCG/INH Aepb Commonly known as:  INCRUSE ELLIPTA Inhale 1 puff into the lungs daily.   vitamin C 500 MG tablet Commonly known as:  ASCORBIC ACID Take 1,000 mg by mouth 2 (two) times daily.   Vitamin D3 2000 units Tabs Take 2,000 Units by mouth every morning.            Durable Medical Equipment        Start     Ordered   05/10/17 1429  For home use only DME oxygen  Once    Question Answer Comment  Mode or (Route) Nasal cannula   Liters per Minute 4   Frequency Continuous (stationary and portable oxygen unit needed)   Oxygen conserving device Yes   Oxygen delivery system Gas      05/10/17 1429     Follow-up Information    Mosie Lukes, MD. Schedule an appointment as soon as possible for a visit in 1 week(s).   Specialty:  Family Medicine Contact information: [address] [place] [address] [phone]          Allergies   Allergen Reactions  . Citalopram Palpitations    Irregular heart beat  . Ciprofloxacin     Mental status change  . Erythromycin Other (See Comments)    Stomach cramps  . Glimepiride     Elevated ammonia levels  . Prednisone     Increased blood sugars too high    Consultations:  None.    Procedures/Studies: Dg Chest Port 1 View  Result Date: 05/05/2017 CLINICAL DATA:  Shortness of Breath EXAM: PORTABLE CHEST 1 VIEW COMPARISON:  01/17/2017 FINDINGS: There is hyperinflation of the lungs compatible with COPD. Heart and mediastinal contours are within normal limits. No focal opacities or effusions. No acute bony abnormality. IMPRESSION: COPD.  No active disease. Electronically Signed   By: Rolm Baptise M.D.   On: 05/05/2017 19:41       Subjective: No new complaints.   Discharge Exam: Vitals:   05/12/17 0533 05/12/17 0743  BP: 133/61   Pulse: 81   Resp: 17   Temp: 98.4 F (36.9 C)   SpO2: 91% 90%   Vitals:   05/11/17 2028 05/11/17 2210 05/12/17 0533 05/12/17 0743  BP:  135/61 133/61   Pulse:  84 81   Resp:  18 17   Temp:  98.2 F (36.8 C) 98.4 F (36.9 C)   TempSrc:  Oral Oral   SpO2: 95% 95% 91% 90%  Weight:      Height:        General: Pt is alert, awake, not in acute distress Cardiovascular: RRR, S1/S2 +, no rubs, no gallops Respiratory: scattered wheezing, no rhonchi. Air entry fair.  Abdominal: Soft, NT, ND, bowel sounds + Extremities: no edema, no cyanosis    The results of significant diagnostics from this hospitalization (including imaging, microbiology, ancillary and laboratory) are listed below for reference.     Microbiology: Recent Results (from the past 240 hour(s))  MRSA PCR Screening     Status: None   Collection Time: 05/06/17 12:40 AM  Result Value Ref Range Status   MRSA by PCR NEGATIVE NEGATIVE Final    Comment:        The GeneXpert MRSA Assay (FDA approved for NASAL specimens only),  is one component of a comprehensive MRSA  colonization surveillance program. It is not intended to diagnose MRSA infection nor to guide or monitor treatment for MRSA infections.   Culture, sputum-assessment     Status: None   Collection Time: 05/06/17 10:19 AM  Result Value Ref Range Status   Specimen Description EXPECTORATED SPUTUM  Final   Special Requests NONE  Final   Sputum evaluation THIS SPECIMEN IS ACCEPTABLE FOR SPUTUM CULTURE  Final   Report Status 05/06/2017 FINAL  Final  Culture, respiratory (NON-Expectorated)     Status: None   Collection Time: 05/06/17 10:19 AM  Result Value Ref Range Status   Specimen Description EXPECTORATED SPUTUM  Final   Special Requests NONE Reflexed from [id number]  Final   Gram Stain   Final    RARE WBC PRESENT, PREDOMINANTLY PMN FEW GRAM NEGATIVE RODS Performed at [facility], [place] [address], [place], [place] [address]    Culture MODERATE STENOTROPHOMONAS MALTOPHILIA  Final   Report Status 05/08/2017 FINAL  Final   Organism ID, Bacteria STENOTROPHOMONAS MALTOPHILIA  Final      Susceptibility   Stenotrophomonas maltophilia - MIC*    LEVOFLOXACIN 0.5 SENSITIVE Sensitive     TRIMETH/SULFA <=20 SENSITIVE Sensitive     * MODERATE STENOTROPHOMONAS MALTOPHILIA     Labs: BNP (last 3 results) No results for input(s): BNP in the last 8760 hours. Basic Metabolic Panel:  Recent Labs Lab 05/05/17 1853 05/06/17 0332 05/07/17 0318 05/08/17 0421 05/10/17 0505  NA 137 135 134* 137 137  K 4.9 4.9 5.5* 4.2 4.7  CL 102 101 101 103 102  CO2 _0 GLUCOSE 181* 307* 316* 185* 228*  BUN 18 24* 42* 46* 39*  CREATININE 0.95 1.23* 1.30* 1.03* 0.86  CALCIUM 9.2 8.6* 8.3* 8.4* 8.7*  MG  --   --   --  2.0  --    Liver Function Tests:  Recent Labs Lab 05/05/17 1853 05/06/17 0332  AST 57* 52*  ALT 33 30  ALKPHOS 157* 149*  BILITOT 1.9* 1.7*  PROT 5.9* 5.4*  ALBUMIN 2.8* 2.4*   No results for input(s): LIPASE, AMYLASE in the last 168 hours.  Recent Labs Lab  05/05/17 1855 05/10/17 1550  AMMONIA 47* 48*   CBC:  Recent Labs Lab 05/05/17 1853 05/06/17 0332 05/10/17 0505  WBC 6.4 5.5 9.0  NEUTROABS 3.3 4.8  --   HGB 14.4 13.5 13.6  HCT 41.6 39.4 39.5  MCV 93.7 95.2 93.8  PLT 110* 104* 98*   Cardiac Enzymes:  Recent Labs Lab 05/05/17 1853  TROPONINI <0.03   BNP: Invalid input(s): POCBNP CBG:  Recent Labs Lab 05/11/17 0811 05/11/17 1134 05/11/17 1704 05/11/17 2208 05/12/17 0806  GLUCAP 143* 137* 110* 225* 129*   D-Dimer No results for input(s): DDIMER in the last 72 hours. Hgb A1c No results for input(s): HGBA1C in the last 72 hours. Lipid Profile No results for input(s): CHOL, HDL, LDLCALC, TRIG, CHOLHDL, LDLDIRECT in the last 72 hours. Thyroid function studies No results for input(s): TSH, T4TOTAL, T3FREE, THYROIDAB in the last 72 hours.  Invalid input(s): FREET3 Anemia work up No results for input(s): VITAMINB12, FOLATE, FERRITIN, TIBC, IRON, RETICCTPCT in the last 72 hours. Urinalysis    Component Value Date/Time   COLORURINE YELLOW 04/14/2017 1108   APPEARANCEUR CLEAR 04/14/2017 1108   LABSPEC >=1.030 (A) 04/14/2017 1108   PHURINE 5.5 04/14/2017 1108   GLUCOSEU 250 (A) 04/14/2017 1108   HGBUR NEGATIVE  04/14/2017 1108   [place] 04/14/2017 1108   BILIRUBINUR neg 12/02/2016 1508   KETONESUR TRACE (A) 04/14/2017 1108   PROTEINUR NEGATIVE 03/11/2017 1250   UROBILINOGEN 0.2 04/14/2017 1108   NITRITE POSITIVE (A) 04/14/2017 1108   LEUKOCYTESUR NEGATIVE 04/14/2017 1108   Sepsis Labs Invalid input(s): PROCALCITONIN,  WBC,  LACTICIDVEN Microbiology Recent Results (from the past 240 hour(s))  MRSA PCR Screening     Status: None   Collection Time: 05/06/17 12:40 AM  Result Value Ref Range Status   MRSA by PCR NEGATIVE NEGATIVE Final    Comment:        The GeneXpert MRSA Assay (FDA approved for NASAL specimens only), is one component of a comprehensive MRSA colonization surveillance program.  It is not intended to diagnose MRSA infection nor to guide or monitor treatment for MRSA infections.   Culture, sputum-assessment     Status: None   Collection Time: 05/06/17 10:19 AM  Result Value Ref Range Status   Specimen Description EXPECTORATED SPUTUM  Final   Special Requests NONE  Final   Sputum evaluation THIS SPECIMEN IS ACCEPTABLE FOR SPUTUM CULTURE  Final   Report Status 05/06/2017 FINAL  Final  Culture, respiratory (NON-Expectorated)     Status: None   Collection Time: 05/06/17 10:19 AM  Result Value Ref Range Status   Specimen Description EXPECTORATED SPUTUM  Final   Special Requests NONE Reflexed from [id number]  Final   Gram Stain   Final    RARE WBC PRESENT, PREDOMINANTLY PMN FEW GRAM NEGATIVE RODS Performed at [facility], [place] [address] [place], [place] [address]    Culture MODERATE STENOTROPHOMONAS MALTOPHILIA  Final   Report Status 05/08/2017 FINAL  Final   Organism ID, Bacteria STENOTROPHOMONAS MALTOPHILIA  Final      Susceptibility   Stenotrophomonas maltophilia - MIC*    LEVOFLOXACIN 0.5 SENSITIVE Sensitive     TRIMETH/SULFA <=20 SENSITIVE Sensitive     * MODERATE STENOTROPHOMONAS MALTOPHILIA     Time coordinating discharge: Over 30 minutes  SIGNED:   Hosie Poisson, MD  [facility] 05/12/2017, 10:22 AM Pager   If 7PM-7AM, please contact night-coverage [email or web address] Password [email or web address]

## 2017-05-12 NOTE — Patient Outreach (Signed)
[facility]) Care Management  05/12/2017  Ashley Savage 05-04-1941 [id number]   Notification received from [facility] liaison that patient will be discharged from the hospital to a skilled nursing facility.  PLAN: No additional follow up needed at this time.   [name] RN,BSN,CCM [facility] Telephonic  [phone]

## 2017-05-13 ENCOUNTER — Encounter: Payer: Self-pay | Admitting: *Deleted

## 2017-05-13 ENCOUNTER — Ambulatory Visit: Payer: Self-pay | Admitting: *Deleted

## 2017-05-14 ENCOUNTER — Non-Acute Institutional Stay (SKILLED_NURSING_FACILITY): Payer: PPO | Admitting: Internal Medicine

## 2017-05-14 DIAGNOSIS — K7682 Hepatic encephalopathy: Secondary | ICD-10-CM

## 2017-05-14 DIAGNOSIS — D696 Thrombocytopenia, unspecified: Secondary | ICD-10-CM | POA: Diagnosis not present

## 2017-05-14 DIAGNOSIS — E559 Vitamin D deficiency, unspecified: Secondary | ICD-10-CM

## 2017-05-14 DIAGNOSIS — Z720 Tobacco use: Secondary | ICD-10-CM | POA: Diagnosis not present

## 2017-05-14 DIAGNOSIS — J441 Chronic obstructive pulmonary disease with (acute) exacerbation: Secondary | ICD-10-CM | POA: Diagnosis not present

## 2017-05-14 DIAGNOSIS — Z794 Long term (current) use of insulin: Secondary | ICD-10-CM

## 2017-05-14 DIAGNOSIS — K7581 Nonalcoholic steatohepatitis (NASH): Secondary | ICD-10-CM

## 2017-05-14 DIAGNOSIS — E1142 Type 2 diabetes mellitus with diabetic polyneuropathy: Secondary | ICD-10-CM

## 2017-05-14 DIAGNOSIS — J9601 Acute respiratory failure with hypoxia: Secondary | ICD-10-CM

## 2017-05-14 DIAGNOSIS — K7460 Unspecified cirrhosis of liver: Secondary | ICD-10-CM

## 2017-05-14 DIAGNOSIS — K7290 Hepatic failure, unspecified without coma: Secondary | ICD-10-CM

## 2017-05-14 DIAGNOSIS — E875 Hyperkalemia: Secondary | ICD-10-CM | POA: Diagnosis not present

## 2017-05-14 DIAGNOSIS — R7989 Other specified abnormal findings of blood chemistry: Secondary | ICD-10-CM

## 2017-05-14 DIAGNOSIS — K219 Gastro-esophageal reflux disease without esophagitis: Secondary | ICD-10-CM

## 2017-05-14 DIAGNOSIS — N179 Acute kidney failure, unspecified: Secondary | ICD-10-CM

## 2017-05-14 NOTE — Progress Notes (Signed)
: Provider:  Noah Delaine. Sheppard Coil, MD Location:  [facility] Room Number: [phone] Place of Service:  SNF ([phone])  PCP: Mosie Lukes, MD Patient Care Team: Mosie Lukes, MD as PCP - General (Family Medicine) Dene Gentry, MD as Consulting Physician (Sports Medicine) Mauri Pole, MD as Consulting Physician (Gastroenterology) Rigoberto Noel, MD as Consulting Physician (Pulmonary Disease) Parrett, Fonnie Mu, NP as Nurse Practitioner (Pulmonary Disease) Volanda Napoleon, MD as Consulting Physician (Oncology) Dannielle Karvonen, RN as [place] Management Tobi Bastos, RN as [place] Management Saporito, Maree Erie, LCSW as [place] Management  Extended Emergency Contact Information Primary Emergency Contact: Crawford Givens          [place] Phone: [phone] Mobile Phone: [phone] Relation: Friend Secondary Emergency Contact: Kathryne Hitch Address: [address]          [place],  [address] [place] Phone: [phone] Mobile Phone: [phone] Relation: Friend     Allergies: Citalopram; Ciprofloxacin; Erythromycin; Glimepiride; and Prednisone  Chief Complaint  Patient presents with  . New Admit To SNF    following hospitalization 05/05/17 to 05/12/17 COPD with acute exacerbation    HPI: Patient is 76 y.o. female with cirrhosis secondary to NASH, COPD with ongoing tobacco abuse, diabetes mellitus, who presented to ED with dyspnea with minimal exertion that had developed approximately 4 days prior and have continued to worsen despite her use of inhalers at home. Patient is believes this may have been precipitated by seasonal allergies explained that she normally starts Zyrtec at this time of the year but had not and had been outdoors all day just prior to the onset of symptoms. Admits she is trying to cut back on her smoking.  Admits she does not use oxygen at home. Patient was found to be afebrile, O2 sat 87% on room air tachypnea take in the high 20s unrevealing chest x-ray. Patient was started on BiPAP and treated with multiple nebs given IV Solu-Medrol with some improvement with still significant distress. Patient was admitted to [facility] from 10/10-17 for an acute COPD exacerbation along with acute hypoxic respiratory failure. Patient was treated with BiPAP Solu-Medrol, duo nebs and Zithromax. Levaquin with some improvement and was able to be transitioned to 4 L nasal cannula with sats around 90%. Hospital course was, K by mildly elevated ammonia secondary to cirrhosis for which her dose of lactulose was increased and 14 beat run of V. tach for which patient was asymptomatic, mag level was ordered and repleted with no recurrence. Patient is admitted to skilled nursing facility for generalized weakness for OT/PT. While at skilled nursing facility patient will be followed for GERD treated with omeprazole, vitamin D deficiency treated with replacement, and diabetes mellitus treated with Levemir.  Past Medical History:  Diagnosis Date  . Abdominal aortic aneurysm ([place]) 10/01/2013   Fall of 2014 3.3 per patient, follows with Vascular surgeon.   . Acute respiratory failure with hypoxia ([place]) 07/31/2015  . Allergic state 11/10/2016  . Anxiety   . Anxiety and depression 02/01/2014  . Arthritis of both knees 10/01/2013  . Arthritis of right knee 10/01/2013   Follows with Dr Mayer Camel   . Benign paroxysmal positional vertigo 10/01/2013  . Breast cancer ([place])    No disease activity On Femara Follows with Dr Marin Olp Right mastectomy performed by Dr Autumn Messing   . Cancer (  [place]) breast ca  right  . Chronic respiratory failure ([place]) 08/29/2015  . Cirrhosis of liver without ascites ([place]) 07/31/2015  . COPD (chronic obstructive pulmonary disease) ([place]) 10/01/2013  . COPD with acute exacerbation ([place]) 05/05/2017  . Depression   . Dermatitis  03/30/2017  . Diabetes mellitus type 2  . Diabetes mellitus type 2, controlled ([place]) 10/16/2014  . Emphysema   . Encephalopathy, hepatic ([place]) 06/07/2014  . Esophageal reflux 10/01/2013  . Fall 07/30/2016  . Hyperlipidemia   . Hyperlipidemia, mixed   . Increased ammonia level 11/25/2014  . NASH (nonalcoholic steatohepatitis) 08/26/2015  . Neck pain 10/01/2013  . Neuropathy    feet   . Osteopenia 03/30/2017  . Overactive bladder 12/10/2013  . Panic attacks   . Pedal edema 12/10/2013  . Personal history of radiation therapy   . Preventative health care 03/08/2016  . Thrombocytopenia ([place]) 03/17/2012  . Tobacco abuse disorder 02/01/2014  . Type 2 diabetes mellitus with hyperglycemia, with long-term current use of insulin ([place])   . Urine frequency 04/13/2017    Past Surgical History:  Procedure Laterality Date  . APPENDECTOMY  2007  . BREAST SURGERY  2009 right  . CATARACT EXTRACTION     x 2  . ESOPHAGOGASTRODUODENOSCOPY (EGD) WITH PROPOFOL N/A 08/14/2016   Procedure: ESOPHAGOGASTRODUODENOSCOPY (EGD) WITH PROPOFOL;  Surgeon: Mauri Pole, MD;  Location: [facility];  Service: Endoscopy;  Laterality: N/A;  . [place]  . KNEE SURGERY    . MANDIBLE FRACTURE SURGERY    . MASTECTOMY    . PILONIDAL CYST EXCISION    . TONSILLECTOMY      Allergies as of 05/14/2017      Reactions   Citalopram Palpitations   Irregular heart beat   Ciprofloxacin    Mental status change   Erythromycin Other (See Comments)   Stomach cramps   Glimepiride    Elevated ammonia levels   Prednisone    Increased blood sugars too high      Medication List       Accurate as of 05/14/17 10:19 AM. Always use your most recent med list.          albuterol 108 (90 Base) MCG/ACT inhaler Commonly known as:  PROVENTIL HFA;VENTOLIN HFA Inhale 2 puffs into the lungs every 6 (six) hours as needed for wheezing or shortness of breath. Only dispense Ventolin   albuterol 1.25 MG/3ML nebulizer  solution Commonly known as:  ACCUNEB USE 1 VIAL VIA NEBULIZER EVERY 6 HOURS AS NEEDED FOR WHEEZING   BD PEN NEEDLE NANO U/F 32G X 4 MM Misc Generic drug:  Insulin Pen Needle USE AS DIRECTED WITH LEVEMIR FLEXPEN   benzonatate 200 MG capsule Commonly known as:  TESSALON Take 1 capsule (200 mg total) by mouth 3 (three) times daily as needed for cough.   budesonide 0.25 MG/2ML nebulizer solution Commonly known as:  PULMICORT Take 2 mLs (0.25 mg total) by nebulization 2 (two) times daily.   furosemide 20 MG tablet Commonly known as:  LASIX TAKE 1 TABLET BY MOUTH TWICE DAILY   glucose blood test strip Commonly known as:  ONE TOUCH ULTRA TEST CHECK BLOOD SUGAR TWICE DAILY AS DIRECTED   guaiFENesin 600 MG 12 hr tablet Commonly known as:  MUCINEX Take 2 tablets (1,200 mg total) by mouth 2 (two) times daily as needed for to loosen phlegm.   lactulose 10 GM/15ML solution Commonly known as:  CHRONULAC TAKE 45ML BY MOUTH TWICE DAILY AS NEEDED FOR MILD  CONSTIPATION   LEVEMIR FLEXTOUCH 100 UNIT/ML Pen Generic drug:  Insulin Detemir INJECT 14 UNITS UNDER THE SKIN EVERY DAY AT 10PM   levofloxacin 750 MG tablet Commonly known as:  LEVAQUIN Take 1 tablet (750 mg total) by mouth daily.   mometasone 50 MCG/ACT nasal spray Commonly known as:  NASONEX Place 2 sprays into the nose daily.   mupirocin ointment 2 % Commonly known as:  [place] on application over lesion on leg twice daily.   omeprazole 20 MG capsule Commonly known as:  PRILOSEC TAKE 1 CAPSULE(20 MG) BY MOUTH DAILY   ONE TOUCH ULTRA 2 w/Device Kit Use as directed once daily to check blood sugar.  DX E11.9   polyvinyl alcohol 1.4 % ophthalmic solution Commonly known as:  LUBRICANT DROPS Place 1 drop into both eyes as needed (dry eyes).   predniSONE 50 MG tablet Commonly known as:  DELTASONE Prednisone 50 mg daily for 2 days followed by  Prendisone 40 mg daily for 3 days followed by  Prednisone 30 mg daily for 3  days followed by  Prednisone 20 mg daily for 3 days followed by  Prednisone 10 mg daily for 3 days.   rifaximin 550 MG Tabs tablet Commonly known as:  XIFAXAN Take 1 tablet (550 mg total) by mouth 2 (two) times daily.   spironolactone 50 MG tablet Commonly known as:  ALDACTONE TAKE 2 TABLETS(100 MG) BY MOUTH DAILY   Tiotropium Bromide-Olodaterol 2.5-2.5 MCG/ACT Aers Commonly known as:  STIOLTO RESPIMAT Inhale 2 puffs into the lungs daily.   umeclidinium bromide 62.5 MCG/INH Aepb Commonly known as:  INCRUSE ELLIPTA Inhale 1 puff into the lungs daily.   vitamin C 500 MG tablet Commonly known as:  ASCORBIC ACID Take 1,000 mg by mouth 2 (two) times daily.   Vitamin D3 2000 units Tabs Take 2,000 Units by mouth every morning.       No orders of the defined types were placed in this encounter.   Immunization History  Administered Date(s) Administered  . Influenza Split 04/09/2011, 03/27/2012, 03/27/2013  . Influenza, High Dose Seasonal PF 03/30/2017  . Influenza,inj,Quad PF,6+ Mos 03/26/2015  . Influenza-Unspecified 04/24/2014, 04/23/2016  . Pneumococcal Conjugate-13 02/01/2014  . Pneumococcal Polysaccharide-23 04/09/2011  . Td 07/27/2010  . Tdap 05/08/2014    Social History  Substance Use Topics  . Smoking status: Current Some Day Smoker    Packs/day: 0.25    Years: 58.00    Types: Cigarettes    Start date: 07/27/1964  . Smokeless tobacco: Never Used  . Alcohol use No    Family history is   Family History  Problem Relation Age of Onset  . Heart failure Father   . COPD Father   . Arthritis Father 31  . Stroke Mother   . Arthritis Mother 52  . Hyperlipidemia Mother   . Hypertension Mother   . Diabetes Mother   . Diabetes Sister   . Breast cancer Unknown   . Breast cancer Maternal Aunt   . Asthma Maternal Aunt   . Birth defects Maternal Aunt   . Alcohol abuse Maternal Uncle   . Breast cancer Maternal Aunt       Review of Systems  DATA OBTAINED: from  patient, nurse GENERAL:  no fevers, fatigue, appetite changes SKIN: No itching, or rash EYES: No eye pain, redness, discharge EARS: No earache, tinnitus, change in hearing NOSE: No congestion, drainage or bleeding  MOUTH/THROAT: No mouth or tooth pain, No sore throat RESPIRATORY: No cough,  wheezing, SOB CARDIAC: No chest pain, palpitations, lower extremity edema  GI: No abdominal pain, No N/V/D or constipation, No heartburn or reflux  GU: No dysuria, frequency or urgency, or incontinence  MUSCULOSKELETAL: No unrelieved bone/joint pain NEUROLOGIC: No headache, dizziness or focal weakness PSYCHIATRIC: No c/o anxiety or sadness   Vitals:   05/14/17 1000  BP: (!) 136/55  Pulse: 78  Resp: 18  Temp: (!) 97.3 F (36.3 C)  SpO2: 99%    SpO2 Readings from Last 1 Encounters:  05/14/17 99%   Body mass index is 23.4 kg/m.     Physical Exam  GENERAL APPEARANCE: Alert, conversant,  No acute distress.  SKIN: No diaphoresis rash HEAD: Normocephalic, atraumatic  EYES: Conjunctiva/lids clear. Pupils round, reactive. EOMs intact.  EARS: External exam WNL, canals clear. Hearing grossly normal.  NOSE: No deformity or discharge.  MOUTH/THROAT: Lips w/o lesions  RESPIRATORY: Breathing is even, unlabored. Lung sounds are clear   CARDIOVASCULAR: Heart RRR 3/6 murmur, no rubs or gallops. No peripheral edema.   GASTROINTESTINAL: Abdomen is soft, non-tender, not distended w/ normal bowel sounds. GENITOURINARY: Bladder non tender, not distended  MUSCULOSKELETAL: No abnormal joints or musculature NEUROLOGIC:  Cranial nerves 2-12 grossly intact. Moves all extremities; essential tremor  PSYCHIATRIC: Mood and affect appropriate to situation, no behavioral issues  Patient Active Problem List   Diagnosis Date Noted  . COPD with acute exacerbation ([place]) 05/05/2017  . Urine frequency 04/13/2017  . Osteopenia 03/30/2017  . Dermatitis 03/30/2017  . Allergic state 11/10/2016  . Esophageal varices in  cirrhosis ([facility])   . Portal hypertensive gastropathy ([place])   . Lower back injury, initial encounter 08/05/2016  . Fall 07/30/2016  . Preventative health care 03/08/2016  . Muscle spasm 02/25/2016  . Chronic respiratory failure ([place]) 08/29/2015  . NASH (nonalcoholic steatohepatitis) 08/26/2015  . Type 2 diabetes mellitus with hyperglycemia, with long-term current use of insulin ([place])   . Cirrhosis of liver without ascites ([place]) 07/31/2015  . Acute respiratory failure with hypoxia ([place]) 07/31/2015  . Diarrhea 12/30/2014  . Increased ammonia level 11/25/2014  . Diabetes mellitus type 2, controlled ([place]) 10/16/2014  . Encephalopathy, hepatic ([place]) 06/07/2014  . Right knee pain 06/07/2014  . Sun-damaged skin 02/01/2014  . Anxiety and depression 02/01/2014  . Tobacco abuse 02/01/2014  . Medicare annual wellness visit, subsequent 02/01/2014  . Pedal edema 12/10/2013  . Overactive bladder 12/10/2013  . Abdominal aortic aneurysm ([place]) 10/01/2013  . Arthritis of right knee 10/01/2013  . Benign paroxysmal positional vertigo 10/01/2013  . Neck pain 10/01/2013  . Esophageal reflux 10/01/2013  . Thrombocytopenia ([place]) 03/17/2012  . 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   . Hyperlipidemia, mixed   . Breast cancer [facility])       Labs reviewed: Basic Metabolic Panel:    Component Value Date/Time   NA 137 05/10/2017 0505   NA 137 01/05/2017 1031   NA 139 06/29/2016 1128   K 4.7 05/10/2017 0505   K 4.3 01/05/2017 1031   K 5.0 06/29/2016 1128   CL 102 05/10/2017 0505   CL 100 01/05/2017 1031   CO2 28 05/10/2017 0505   CO2 30 01/05/2017 1031   CO2 24 06/29/2016 1128   GLUCOSE 228 (H) 05/10/2017 0505   GLUCOSE 159 (H) 01/05/2017 1031   BUN 39 (H) 05/10/2017 0505   BUN 16 01/05/2017 1031   BUN 15.9 06/29/2016 1128   CREATININE 0.86 05/10/2017 0505   CREATININE 1.0 01/05/2017 1031   CREATININE  1.1 06/29/2016 1128   CALCIUM 8.7 (L) 05/10/2017 0505    CALCIUM 9.8 01/05/2017 1031   CALCIUM 9.0 06/29/2016 1128   PROT 5.4 (L) 05/06/2017 0332   PROT 6.1 (L) 01/05/2017 1031   PROT 5.9 (L) 06/29/2016 1128   ALBUMIN 2.4 (L) 05/06/2017 0332   ALBUMIN 3.3 01/05/2017 1031   ALBUMIN 2.9 (L) 09/28/2016 1324   ALBUMIN 2.7 (L) 06/29/2016 1128   AST 52 (H) 05/06/2017 0332   AST 46 (H) 01/05/2017 1031   AST 36 (H) 06/29/2016 1128   ALT 30 05/06/2017 0332   ALT 38 01/05/2017 1031   ALT 24 06/29/2016 1128   ALKPHOS 149 (H) 05/06/2017 0332   ALKPHOS 163 (H) 01/05/2017 1031   ALKPHOS 129 06/29/2016 1128   BILITOT 1.7 (H) 05/06/2017 0332   BILITOT 3.10 (H) 01/05/2017 1031   BILITOT 1.78 (H) 06/29/2016 1128   GFRNONAA >60 05/10/2017 0505   GFRAA >60 05/10/2017 0505     Recent Labs  05/07/17 0318 05/08/17 0421 05/10/17 0505  NA 134* 137 137  K 5.5* 4.2 4.7  CL 101 103 102  CO2 '25 25 28  ' GLUCOSE 316* 185* 228*  BUN 42* 46* 39*  CREATININE 1.30* 1.03* 0.86  CALCIUM 8.3* 8.4* 8.7*  MG  --  2.0  --    Liver Function Tests:  Recent Labs  04/13/17 1604 05/05/17 1853 05/06/17 0332  AST 40* 57* 52*  ALT 26 33 30  ALKPHOS 120* 157* 149*  BILITOT 2.8* 1.9* 1.7*  PROT 5.4* 5.9* 5.4*  ALBUMIN 2.6* 2.8* 2.4*   No results for input(s): LIPASE, AMYLASE in the last 8760 hours.  Recent Labs  03/11/17 1100 05/05/17 1855 05/10/17 1550  AMMONIA 65* 47* 48*   CBC:  Recent Labs  04/13/17 1604 05/05/17 1853 05/06/17 0332 05/10/17 0505  WBC 6.4 6.4 5.5 9.0  NEUTROABS 3.5 3.3 4.8  --   HGB 14.1 14.4 13.5 13.6  HCT 43.6 41.6 39.4 39.5  MCV 99.4 93.7 95.2 93.8  PLT 132.0* 110* 104* 98*   Lipid  Recent Labs  11/10/16 1439 02/09/17 1222  CHOL 164 160  HDL 40.60 48.40  LDLCALC 106* 96  TRIG 85.0 78.0    Cardiac Enzymes:  Recent Labs  05/05/17 1853  TROPONINI <0.03   BNP: No results for input(s): BNP in the last 8760 hours. Lab Results  Component Value Date   MICROALBUR <0.7 02/25/2016   Lab Results  Component  Value Date   HGBA1C 5.8 02/09/2017   Lab Results  Component Value Date   TSH 1.16 11/10/2016   Lab Results  Component Value Date   IOEVOJJK09 381 10/16/2014   Lab Results  Component Value Date   FOLATE >20.0 10/16/2014   Lab Results  Component Value Date   IRON 54 07/31/2015   TIBC 344 07/31/2015   FERRITIN 16 07/31/2015    Imaging and Procedures obtained prior to SNF admission: Dg Chest Port 1 View  Result Date: 05/05/2017 CLINICAL DATA:  Shortness of Breath EXAM: PORTABLE CHEST 1 VIEW COMPARISON:  01/17/2017 FINDINGS: There is hyperinflation of the lungs compatible with COPD. Heart and mediastinal contours are within normal limits. No focal opacities or effusions. No acute bony abnormality. IMPRESSION: COPD.  No active disease. Electronically Signed   By: Rolm Baptise M.D.   On: 05/05/2017 19:41     Not all labs, radiology exams or other studies done during hospitalization come through on my EPIC note; however they are reviewed by me.  Assessment and Plan  ACUTE RESPIRATORY FAILURE WITH HYPOXIA/ ACUTE COPD EXACERBATION-treated with BiPAP, IV Solu-Medrol, duo nebs and Zithromax. Sputum cultures grew out stenotrophomonas maltophilia and patient was changed to Levaquin; she was transitioned to nasal cannula oxygen and sats around 90% with 4 L; IV steroids were changed to by mouth prednisone was discharged with a prednisone taper and her usual inhalers SNF - admitted for OT/PT; plan to continue Levaquin for 5 more days 750 mg daily; continue Pulmicort by neb twice a day, Mucinex 1200 mg twice a day, Inderal when necessary Tessalon Perles 200 mg when necessary Stiolto Respimat 2 puffs daily and Incruse Ellipta 62.5 g 1 puff daily; continue prednisone taper starting at 50 for 2 days, 40 mg for 3 days, 30 mg for 3 days, 20 mg for 3 days, 10 mg for 3 days then stop  CIRRHOSIS SECONDARY TO NASH/ACUTE HEPATIC ENCEPHALOPATHY-ammonia was elevated to 47 extra lactulose was added to  patient's regimen SNF - continue Afaxin and 550 mg twice a day, spironolactone 100 mg by mouth daily and lactulose 45 mmol twice a day  Diabetes mellitus type 2 SNF -A1c 3 months ago 5.8 which was stable from prior; plan to continue Levemir 14 units daily at bedtime and CBGs twice a day  THROMBOCYTOPENIA SNF - stable around 110 will follow-up CBC  AKI/HYPERKALEMIA-gentle hydration with improvement; ratio and Lasix on discharge; unclear about her potassium, Kayexalate was added SNF - will follow-up BMP  V TACH-14 beat run, asymptomatic; magnesium level was ordered and repleted with no further episodes of V. Tach SNF - will monitor potassium  GERD SNF - not stated as uncontrolled; plan to continue omeprazole 20 mg by mouth daily  VITAMIN D DEFICIENCY SNF - stable; plan to continue replacement at 2000 units daily   Time spent greater than 45 minutes;> 50% of time with patient was spent reviewing records, labs, tests and studies, counseling and developing plan of care  Webb Silversmith D. Sheppard Coil, MD

## 2017-05-17 ENCOUNTER — Encounter: Payer: Self-pay | Admitting: Internal Medicine

## 2017-05-17 ENCOUNTER — Non-Acute Institutional Stay (SKILLED_NURSING_FACILITY): Payer: PPO | Admitting: Internal Medicine

## 2017-05-17 DIAGNOSIS — E559 Vitamin D deficiency, unspecified: Secondary | ICD-10-CM | POA: Insufficient documentation

## 2017-05-17 DIAGNOSIS — E875 Hyperkalemia: Secondary | ICD-10-CM | POA: Insufficient documentation

## 2017-05-17 DIAGNOSIS — N179 Acute kidney failure, unspecified: Secondary | ICD-10-CM | POA: Insufficient documentation

## 2017-05-17 DIAGNOSIS — E1165 Type 2 diabetes mellitus with hyperglycemia: Secondary | ICD-10-CM | POA: Diagnosis not present

## 2017-05-17 NOTE — Progress Notes (Signed)
Location:  [facility] Room Number: [place]:  SNF [phone])  Provider: Noah Delaine. Sheppard Coil, MD  Mosie Lukes, MD  Patient Care Team: Mosie Lukes, MD as PCP - General (Family Medicine) Dene Gentry, MD as Consulting Physician (Sports Medicine) Mauri Pole, MD as Consulting Physician (Gastroenterology) Rigoberto Noel, MD as Consulting Physician (Pulmonary Disease) Parrett, Fonnie Mu, NP as Nurse Practitioner (Pulmonary Disease) Volanda Napoleon, MD as Consulting Physician (Oncology) Dannielle Karvonen, RN as [facility] Tobi Bastos, RN as [facility] Saporito, Maree Erie, LCSW as [facility] [facility]  Extended Emergency Contact Information Primary Emergency Contact: Crawford Givens          [place] Phone: [phone] Mobile Phone: [phone] Relation: Friend Secondary Emergency Contact: Kathryne Hitch Address: [address]          [place], [place] [address] Johnnette Litter of [place] Phone: [phone] Mobile Phone: [phone] Relation: Friend    Allergies: Citalopram; Ciprofloxacin; Erythromycin; Glimepiride; and Prednisone  Chief Complaint  Patient presents with  . Acute Visit    elvated blood sugar due to high dose of Prednisone    HPI: Patient is 76 y.o. female who nursing asked me to see because her blood sugars are now running in the 300s. Patient has been skilled nursing facility for 4 days onlyon day one blood sugars were good in the low 100s but have climbed every day since. Patient has no complaint of symptoms with this. Patient is on prednisone taper for 2  More days only.She was discharged from the hospital to [place] on Lantus 14 units subcutaneous daily only.  Past Medical History:  Diagnosis Date  . Abdominal aortic aneurysm ([place]) 10/01/2013   Fall of 2014 3.3 per patient, follows with Vascular  surgeon.   . Acute respiratory failure with hypoxia ([place]) 07/31/2015  . Allergic state 11/10/2016  . Anxiety   . Anxiety and depression 02/01/2014  . Arthritis of both knees 10/01/2013  . Arthritis of right knee 10/01/2013   Follows with Dr Mayer Camel   . Benign paroxysmal positional vertigo 10/01/2013  . Breast cancer ([place])    No disease activity On Femara Follows with Dr Marin Olp Right mastectomy performed by Dr Autumn Messing   . Cancer [facility]) breast ca  right  . Chronic respiratory failure ([place]) 08/29/2015  . Cirrhosis of liver without ascites ([place]) 07/31/2015  . COPD (chronic obstructive pulmonary disease) ([place]) 10/01/2013  . COPD with acute exacerbation ([place]) 05/05/2017  . Depression   . Dermatitis 03/30/2017  . Diabetes mellitus type 2  . Diabetes mellitus type 2, controlled ([place]) 10/16/2014  . Emphysema   . Encephalopathy, hepatic ([place]) 06/07/2014  . Esophageal reflux 10/01/2013  . Fall 07/30/2016  . Hyperlipidemia   . Hyperlipidemia, mixed   . Increased ammonia level 11/25/2014  . NASH (nonalcoholic steatohepatitis) 08/26/2015  . Neck pain 10/01/2013  . Neuropathy    feet   . Osteopenia 03/30/2017  . Overactive bladder 12/10/2013  . Panic attacks   . Pedal edema 12/10/2013  . Personal history of radiation therapy   . Preventative health care 03/08/2016  . Thrombocytopenia ([place]) 03/17/2012  . Tobacco abuse disorder 02/01/2014  . Type 2 diabetes mellitus with hyperglycemia, with long-term current use of insulin ([place])   . Urine frequency 04/13/2017    Past Surgical History:  Procedure Laterality Date  . APPENDECTOMY  2007  .  BREAST SURGERY  2009 right  . CATARACT EXTRACTION     x 2  . ESOPHAGOGASTRODUODENOSCOPY (EGD) WITH PROPOFOL N/A 08/14/2016   Procedure: ESOPHAGOGASTRODUODENOSCOPY (EGD) WITH PROPOFOL;  Surgeon: Mauri Pole, MD;  Location: [facility];  Service: Endoscopy;  Laterality: N/A;  . [place]  . KNEE SURGERY    . MANDIBLE FRACTURE SURGERY    . MASTECTOMY    . PILONIDAL  CYST EXCISION    . TONSILLECTOMY      Allergies as of 05/17/2017      Reactions   Citalopram Palpitations   Irregular heart beat   Ciprofloxacin    Mental status change   Erythromycin Other (See Comments)   Stomach cramps   Glimepiride    Elevated ammonia levels   Prednisone    Increased blood sugars too high      Medication List       Accurate as of 05/17/17  4:24 PM. Always use your most recent med list.          albuterol 108 (90 Base) MCG/ACT inhaler Commonly known as:  PROVENTIL HFA;VENTOLIN HFA Inhale 2 puffs into the lungs every 6 (six) hours as needed for wheezing or shortness of breath. Only dispense Ventolin   albuterol 1.25 MG/3ML nebulizer solution Commonly known as:  ACCUNEB USE 1 VIAL VIA NEBULIZER EVERY 6 HOURS AS NEEDED FOR WHEEZING   BD PEN NEEDLE NANO U/F 32G X 4 MM Misc Generic drug:  Insulin Pen Needle USE AS DIRECTED WITH LEVEMIR FLEXPEN   benzonatate 200 MG capsule Commonly known as:  TESSALON Take 1 capsule (200 mg total) by mouth 3 (three) times daily as needed for cough.   budesonide 0.25 MG/2ML nebulizer solution Commonly known as:  PULMICORT Take 2 mLs (0.25 mg total) by nebulization 2 (two) times daily.   furosemide 20 MG tablet Commonly known as:  LASIX TAKE 1 TABLET BY MOUTH TWICE DAILY   glucose blood test strip Commonly known as:  ONE TOUCH ULTRA TEST CHECK BLOOD SUGAR TWICE DAILY AS DIRECTED   guaiFENesin 600 MG 12 hr tablet Commonly known as:  MUCINEX Take 2 tablets (1,200 mg total) by mouth 2 (two) times daily as needed for to loosen phlegm.   lactulose 10 GM/15ML solution Commonly known as:  CHRONULAC TAKE 45ML BY MOUTH TWICE DAILY AS NEEDED FOR MILD CONSTIPATION   LEVEMIR FLEXTOUCH 100 UNIT/ML Pen Generic drug:  Insulin Detemir INJECT 14 UNITS UNDER THE SKIN EVERY DAY AT 10PM   levofloxacin 750 MG tablet Commonly known as:  LEVAQUIN Take 1 tablet (750 mg total) by mouth daily.   mometasone 50 MCG/ACT nasal  spray Commonly known as:  NASONEX Place 2 sprays into the nose daily.   omeprazole 20 MG capsule Commonly known as:  PRILOSEC TAKE 1 CAPSULE(20 MG) BY MOUTH DAILY   ONE TOUCH ULTRA 2 w/Device Kit Use as directed once daily to check blood sugar.  DX E11.9   polyvinyl alcohol 1.4 % ophthalmic solution Commonly known as:  LUBRICANT DROPS Place 1 drop into both eyes as needed (dry eyes).   predniSONE 50 MG tablet Commonly known as:  DELTASONE Prednisone 50 mg daily for 2 days followed by  Prendisone 40 mg daily for 3 days followed by  Prednisone 30 mg daily for 3 days followed by  Prednisone 20 mg daily for 3 days followed by  Prednisone 10 mg daily for 3 days.   rifaximin 550 MG Tabs tablet Commonly known as:  XIFAXAN Take  1 tablet (550 mg total) by mouth 2 (two) times daily.   Tiotropium Bromide-Olodaterol 2.5-2.5 MCG/ACT Aers Commonly known as:  STIOLTO RESPIMAT Inhale 2 puffs into the lungs daily.   vitamin C 500 MG tablet Commonly known as:  ASCORBIC ACID Take 1,000 mg by mouth 2 (two) times daily.   Vitamin D3 2000 units Tabs Take 2,000 Units by mouth every morning.       No orders of the defined types were placed in this encounter.   Immunization History  Administered Date(s) Administered  . Influenza Split 04/09/2011, 03/27/2012, 03/27/2013  . Influenza, High Dose Seasonal PF 03/30/2017  . Influenza,inj,Quad PF,6+ Mos 03/26/2015  . Influenza-Unspecified 04/24/2014, 04/23/2016  . Pneumococcal Conjugate-13 02/01/2014  . Pneumococcal Polysaccharide-23 04/09/2011  . Td 07/27/2010  . Tdap 05/08/2014    Social History  Substance Use Topics  . Smoking status: Current Some Day Smoker    Packs/day: 0.25    Years: 58.00    Types: Cigarettes    Start date: 07/27/1964  . Smokeless tobacco: Never Used  . Alcohol use No    Review of Systems  DATA OBTAINED: from patient, nurse GENERAL:  no fevers, fatigue, appetite changes SKIN: No itching, rash HEENT: No  complaint RESPIRATORY: No cough, wheezing, SOB CARDIAC: No chest pain, palpitations, lower extremity edema  GI: No abdominal pain, No N/V/D or constipation, No heartburn or reflux  GU: No dysuria, frequency or urgency, or incontinence  MUSCULOSKELETAL: No unrelieved bone/joint pain NEUROLOGIC: No headache, dizziness  PSYCHIATRIC: No overt anxiety or sadness  Vitals:   05/17/17 1612  BP: 110/62  Pulse: 68  Resp: 18  Temp: 98.3 F (36.8 C)  SpO2: 94%   Body mass index is 25.66 kg/m. Physical Exam  GENERAL APPEARANCE: Alert, conversant, No acute distress  SKIN: No diaphoresis rash HEENT: Unremarkable RESPIRATORY: Breathing is even, unlabored. Lung sounds are clear   CARDIOVASCULAR: Heart RRR3/6 systolic murmur no rubs or gallops. No peripheral edema  GASTROINTESTINAL: Abdomen is soft, non-tender, not distended w/ normal bowel sounds.  GENITOURINARY: Bladder non tender, not distended  MUSCULOSKELETAL: No abnormal joints or musculature NEUROLOGIC: Cranial nerves 2-12 grossly intact. Moves all extremities PSYCHIATRIC: Mood and affect appropriate to situation, no behavioral issues  Patient Active Problem List   Diagnosis Date Noted  . COPD with acute exacerbation (HCC) 05/05/2017  . Urine frequency 04/13/2017  . Osteopenia 03/30/2017  . Dermatitis 03/30/2017  . Allergic state 11/10/2016  . 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 Portal hypertensive gastropathy (HCC)   . Lower back injury, initial encounter 08/05/2016  . Fall 07/30/2016  . Preventative health care 03/08/2016  . Muscle spasm 02/25/2016  . Chronic respiratory failure (HCC) 08/29/2015  . NASH (nonalcoholic steatohepatitis) 08/26/2015  . Type 2 diabetes mellitus with hyperglycemia, with long-term current use of insulin (HCC)   . Cirrhosis of liver without ascites (HCC) 07/31/2015  . Acute respiratory failure with hypoxia (HCC) 07/31/2015  . Diarrhea 12/30/2014  . Increased ammonia level 11/25/2014  .  Diabetes mellitus type 2, controlled (HCC) 10/16/2014  . Encephalopathy, hepatic (HCC) 06/07/2014  . Right knee pain 06/07/2014  . Sun-damaged skin 02/01/2014  . Anxiety and depression 02/01/2014  . Tobacco abuse 02/01/2014  . Medicare annual wellness visit, subsequent 02/01/2014  . Pedal edema 12/10/2013  . Overactive bladder 12/10/2013  . Abdominal aortic aneurysm (HCC) 10/01/2013  . Arthritis of right knee 10/01/2013  . Benign paroxysmal positional vertigo 10/01/2013  . Neck pain 10/01/2013  . Esophageal reflux 10/01/2013  .   Thrombocytopenia (HCC) 03/17/2012  . 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   . Hyperlipidemia, mixed   . Breast cancer (HCC)     CMP     Component Value Date/Time   NA 137 05/10/2017 0505   NA 137 01/05/2017 1031   NA 139 06/29/2016 1128   K 4.7 05/10/2017 0505   K 4.3 01/05/2017 1031   K 5.0 06/29/2016 1128   CL 102 05/10/2017 0505   CL 100 01/05/2017 1031   CO2 28 05/10/2017 0505   CO2 30 01/05/2017 1031   CO2 24 06/29/2016 1128   GLUCOSE 228 (H) 05/10/2017 0505   GLUCOSE 159 (H) 01/05/2017 1031   BUN 39 (H) 05/10/2017 0505   BUN 16 01/05/2017 1031   BUN 15.9 06/29/2016 1128   CREATININE 0.86 05/10/2017 0505   CREATININE 1.0 01/05/2017 1031   CREATININE 1.1 06/29/2016 1128   CALCIUM 8.7 (L) 05/10/2017 0505   CALCIUM 9.8 01/05/2017 1031   CALCIUM 9.0 06/29/2016 1128   PROT 5.4 (L) 05/06/2017 0332   PROT 6.1 (L) 01/05/2017 1031   PROT 5.9 (L) 06/29/2016 1128   ALBUMIN 2.4 (L) 05/06/2017 0332   ALBUMIN 3.3 01/05/2017 1031   ALBUMIN 2.9 (L) 09/28/2016 1324   ALBUMIN 2.7 (L) 06/29/2016 1128   AST 52 (H) 05/06/2017 0332   AST 46 (H) 01/05/2017 1031   AST 36 (H) 06/29/2016 1128   ALT 30 05/06/2017 0332   ALT 38 01/05/2017 1031   ALT 24 06/29/2016 1128   ALKPHOS 149 (H) 05/06/2017 0332   ALKPHOS 163 (H) 01/05/2017 1031   ALKPHOS 129 06/29/2016 1128   BILITOT 1.7 (H) 05/06/2017 0332   BILITOT 3.10 (H)  01/05/2017 1031   BILITOT 1.78 (H) 06/29/2016 1128   GFRNONAA >60 05/10/2017 0505   GFRAA >60 05/10/2017 0505    Recent Labs  05/07/17 0318 05/08/17 0421 05/10/17 0505  NA 134* 137 137  K 5.5* 4.2 4.7  CL 101 103 102  CO2 25 25 28  GLUCOSE 316* 185* 228*  BUN 42* 46* 39*  CREATININE 1.30* 1.03* 0.86  CALCIUM 8.3* 8.4* 8.7*  MG  --  2.0  --     Recent Labs  04/13/17 1604 05/05/17 1853 05/06/17 0332  AST 40* 57* 52*  ALT 26 33 30  ALKPHOS 120* 157* 149*  BILITOT 2.8* 1.9* 1.7*  PROT 5.4* 5.9* 5.4*  ALBUMIN 2.6* 2.8* 2.4*    Recent Labs  04/13/17 1604 05/05/17 1853 05/06/17 0332 05/10/17 0505  WBC 6.4 6.4 5.5 9.0  NEUTROABS 3.5 3.3 4.8  --   HGB 14.1 14.4 13.5 13.6  HCT 43.6 41.6 39.4 39.5  MCV 99.4 93.7 95.2 93.8  PLT 132.0* 110* 104* 98*    Recent Labs  11/10/16 1439 02/09/17 1222  CHOL 164 160  LDLCALC 106* 96  TRIG 85.0 78.0   Lab Results  Component Value Date   MICROALBUR <0.7 02/25/2016   Lab Results  Component Value Date   TSH 1.16 11/10/2016   Lab Results  Component Value Date   HGBA1C 5.8 02/09/2017   Lab Results  Component Value Date   CHOL 160 02/09/2017   HDL 48.40 02/09/2017   LDLCALC 96 02/09/2017   TRIG 78.0 02/09/2017   CHOLHDL 3 02/09/2017    Significant Diagnostic Results in last 30 days:  Dg Chest Port 1 View  Result Date: 05/05/2017 CLINICAL DATA:  Shortness of Breath EXAM: PORTABLE CHEST 1 VIEW COMPARISON:  01/17/2017 FINDINGS: There is   hyperinflation of the lungs compatible with COPD. Heart and mediastinal contours are within normal limits. No focal opacities or effusions. No acute bony abnormality. IMPRESSION: COPD.  No active disease. Electronically Signed   By: Kevin  Dover M.D.   On: 05/05/2017 19:41    Assessment and Plan  Diabetes mellitus type 2 with hyperglycemia- continue patient's record the patient's blood sugar runs high with prednisone; patient is normally well controlled with A1c being 5.8 3 months  ago; will plan to cover her with sliding scale insulin  Until  After she is off prednisone; she will probably be able to drop the sliding scale insulin after that; we'll monitor with meals     D. , MD   

## 2017-05-18 ENCOUNTER — Encounter: Payer: Self-pay | Admitting: *Deleted

## 2017-05-18 ENCOUNTER — Other Ambulatory Visit: Payer: Self-pay | Admitting: *Deleted

## 2017-05-18 NOTE — Patient Outreach (Signed)
[facility]) Care Management  05/18/2017  Ashley Savage July 02, 1941 [id number]   CSW was able to make contact with patient today at [facility], [place] where patient currently resides to receive short-term rehabilitative services, to perform the initial assessment, as well as assess and assist with social work needs and services.  CSW introduced self, explained role and types of services provided through [facility] ([facility]).  CSW further explained to patient that CSW works with [name], [facility] and co-worker making the referral for social work services, also with [facility].  CSW then explained the reason for the visit, indicating that [name] thought that patient would benefit from social work services and resources to assist with discharge planning needs and services from the skilled nursing facility.  CSW obtained two HIPAA compliant identifiers from patient, which included patient's name and date of birth. Patient reports living at home alone with limited support.  However, patient indicated that her current plan of care is to return home to live at time of discharge from [facility].  Patient is agreeable to having home health services and durable medical equipment arranged, through an agency of her choice.  Patient reported that she "cooperating with therapies", both physical and occupational, even though she admits to being very fatigued, requiring her to take several breaks with each therapy session.  Patient also gets short-of-breath, for which she has portable oxygen.  CSW agreed to follow-up with patient in two weeks to assess and assist with discharge planning needs and services. Nat Christen, BSW, MSW, LCSW  Licensed Education officer, [facility]  Mailing [place] [address] [place], [place]  [address] Physical Address-[address], [place], [place] [address] Toll Free Main # [phone] Fax # [phone] Cell # [phone]  Office # [phone] [email or web address]

## 2017-05-19 NOTE — Telephone Encounter (Signed)
Relation to [id number]:[id number] Call back number: [phone] (H)   Reason for call:  Patient checking on the status of message below, please fax to [phone], patient would like confirmation thru my chart when completed.

## 2017-05-20 ENCOUNTER — Encounter: Payer: Self-pay | Admitting: Internal Medicine

## 2017-05-20 ENCOUNTER — Non-Acute Institutional Stay (SKILLED_NURSING_FACILITY): Payer: PPO | Admitting: Internal Medicine

## 2017-05-20 DIAGNOSIS — K219 Gastro-esophageal reflux disease without esophagitis: Secondary | ICD-10-CM

## 2017-05-20 DIAGNOSIS — I472 Ventricular tachycardia, unspecified: Secondary | ICD-10-CM

## 2017-05-20 DIAGNOSIS — J9601 Acute respiratory failure with hypoxia: Secondary | ICD-10-CM | POA: Diagnosis not present

## 2017-05-20 DIAGNOSIS — E1142 Type 2 diabetes mellitus with diabetic polyneuropathy: Secondary | ICD-10-CM | POA: Diagnosis not present

## 2017-05-20 DIAGNOSIS — Z794 Long term (current) use of insulin: Secondary | ICD-10-CM

## 2017-05-20 DIAGNOSIS — N179 Acute kidney failure, unspecified: Secondary | ICD-10-CM

## 2017-05-20 DIAGNOSIS — E875 Hyperkalemia: Secondary | ICD-10-CM | POA: Diagnosis not present

## 2017-05-20 DIAGNOSIS — K7581 Nonalcoholic steatohepatitis (NASH): Secondary | ICD-10-CM | POA: Diagnosis not present

## 2017-05-20 DIAGNOSIS — K7290 Hepatic failure, unspecified without coma: Secondary | ICD-10-CM

## 2017-05-20 DIAGNOSIS — D696 Thrombocytopenia, unspecified: Secondary | ICD-10-CM

## 2017-05-20 DIAGNOSIS — J441 Chronic obstructive pulmonary disease with (acute) exacerbation: Secondary | ICD-10-CM

## 2017-05-20 DIAGNOSIS — Z720 Tobacco use: Secondary | ICD-10-CM

## 2017-05-20 DIAGNOSIS — R7989 Other specified abnormal findings of blood chemistry: Secondary | ICD-10-CM | POA: Diagnosis not present

## 2017-05-20 DIAGNOSIS — E559 Vitamin D deficiency, unspecified: Secondary | ICD-10-CM

## 2017-05-20 DIAGNOSIS — K7460 Unspecified cirrhosis of liver: Secondary | ICD-10-CM

## 2017-05-20 DIAGNOSIS — K7682 Hepatic encephalopathy: Secondary | ICD-10-CM

## 2017-05-20 NOTE — Progress Notes (Signed)
Location:  [address] Room Number: [place]:  SNF [phone])  Provider: Noah Delaine. Sheppard Coil, MD  PCP: Mosie Lukes, MD Patient Care Team: Mosie Lukes, MD as PCP - General (Family Medicine) Dene Gentry, MD as Consulting Physician (Sports Medicine) Mauri Pole, MD as Consulting Physician (Gastroenterology) Rigoberto Noel, MD as Consulting Physician (Pulmonary Disease) Parrett, Fonnie Mu, NP as Nurse Practitioner (Pulmonary Disease) Volanda Napoleon, MD as Consulting Physician (Oncology) Dannielle Karvonen, RN as [place] Management Tobi Bastos, RN as [place] Management Saporito, Maree Erie, LCSW as [place] Management  Extended Emergency Contact Information Primary Emergency Contact: [place], [place] Phone: [phone] Mobile Phone: [phone] Relation: Friend Secondary Emergency Contact: Kathryne Hitch Address: [address]          [place], [place] [address] Johnnette Litter of [place] Phone: [phone] Mobile Phone: [phone] Relation: Friend  Allergies  Allergen Reactions  . Citalopram Palpitations    Irregular heart beat  . Ciprofloxacin     Mental status change  . Erythromycin Other (See Comments)    Stomach cramps  . Glimepiride     Elevated ammonia levels  . Prednisone     Increased blood sugars too high    Chief Complaint  Patient presents with  . Discharge Note    discharge from SNF to home    HPI:  76 y.o. female with cirrhosis secondary to [facility], COPD with ongoing tobacco abuse, diabetes mellitus, who presented to the emergency department with dyspnea with minimal exertion but had developed approximately 4 days prior and had continued to be worse despite her use of inhalers at home. Patient was admitted with a long hospital from 10/2-17 for an acute COPD exacerbation along with acute  hypoxic respiratory failure. Patient was treated with BiPAP, Solu-Medrol, duo nebs and Zithromax and Levaquin with some improvement and was able to transition to 4 L nasal cannula with sats around 90%. Hospital course was compensated by mildly elevated ammonia secondary to cirrhosis for which her dose of lactulose was increased and a 14 beat run of V. tach for which patient was asymptomatic, mag level was repleted and there was no recurrence. Patient was admitted to skilled nursing facility for generalized weakness for OT/PT and is now ready to be discharged to home.    Past Medical History:  Diagnosis Date  . Abdominal aortic aneurysm ([place]) 10/01/2013   Fall of 2014 3.3 per patient, follows with Vascular surgeon.   . Acute respiratory failure with hypoxia ([place]) 07/31/2015  . Allergic state 11/10/2016  . Anxiety   . Anxiety and depression 02/01/2014  . Arthritis of both knees 10/01/2013  . Arthritis of right knee 10/01/2013   Follows with Dr Mayer Camel   . Benign paroxysmal positional vertigo 10/01/2013  . Breast cancer ([place])    No disease activity On Femara Follows with Dr Marin Olp Right mastectomy performed by Dr Autumn Messing   . Cancer [facility]) breast ca  right  . Chronic respiratory failure ([place]) 08/29/2015  . Cirrhosis of liver without ascites ([place]) 07/31/2015  . COPD (chronic obstructive pulmonary disease) ([place]) 10/01/2013  . COPD with acute exacerbation ([place]) 05/05/2017  . Depression   . Dermatitis 03/30/2017  . Diabetes mellitus type 2  . Diabetes mellitus type 2, controlled ([place]) 10/16/2014  . Emphysema   . Encephalopathy,  hepatic ([place]) 06/07/2014  . Esophageal reflux 10/01/2013  . Fall 07/30/2016  . Hyperlipidemia   . Hyperlipidemia, mixed   . Increased ammonia level 11/25/2014  . NASH (nonalcoholic steatohepatitis) 08/26/2015  . Neck pain 10/01/2013  . Neuropathy    feet   . Osteopenia 03/30/2017  . Overactive bladder 12/10/2013  . Panic attacks   . Pedal edema 12/10/2013  . Personal history of radiation  therapy   . Preventative health care 03/08/2016  . Thrombocytopenia ([place]) 03/17/2012  . Tobacco abuse disorder 02/01/2014  . Type 2 diabetes mellitus with hyperglycemia, with long-term current use of insulin ([place])   . Urine frequency 04/13/2017    Past Surgical History:  Procedure Laterality Date  . APPENDECTOMY  2007  . BREAST SURGERY  2009 right  . CATARACT EXTRACTION     x 2  . ESOPHAGOGASTRODUODENOSCOPY (EGD) WITH PROPOFOL N/A 08/14/2016   Procedure: ESOPHAGOGASTRODUODENOSCOPY (EGD) WITH PROPOFOL;  Surgeon: Mauri Pole, MD;  Location: [facility];  Service: Endoscopy;  Laterality: N/A;  . [place]  . KNEE SURGERY    . MANDIBLE FRACTURE SURGERY    . MASTECTOMY    . PILONIDAL CYST EXCISION    . TONSILLECTOMY       reports that she has been smoking Cigarettes.  She started smoking about 52 years ago. She has a 14.50 pack-year smoking history. She has never used smokeless tobacco. She reports that she does not drink alcohol or use drugs. Social History   Social History  . Marital status: Single    Spouse name: N/A  . Number of children: 0  . Years of education: N/A   Occupational History  . retired Retired   Social History Main Topics  . Smoking status: Current Some Day Smoker    Packs/day: 0.25    Years: 58.00    Types: Cigarettes    Start date: 07/27/1964  . Smokeless tobacco: Never Used  . Alcohol use No  . Drug use: No  . Sexual activity: Not on file   Other Topics Concern  . Not on file   Social History Narrative   Lives alone, continues to smoke, no dietary restrictions    Pertinent  Health Maintenance Due  Topic Date Due  . FOOT EXAM  11/24/2016  . URINE MICROALBUMIN  02/24/2017  . OPHTHALMOLOGY EXAM  05/06/2017  . HEMOGLOBIN A1C  08/12/2017  . MAMMOGRAM  01/24/2018  . COLONOSCOPY  07/27/2020  . INFLUENZA VACCINE  Completed  . DEXA SCAN  Completed  . PNA vac Low Risk Adult  Completed    Medications: Allergies as of 05/20/2017       Reactions   Citalopram Palpitations   Irregular heart beat   Ciprofloxacin    Mental status change   Erythromycin Other (See Comments)   Stomach cramps   Glimepiride    Elevated ammonia levels   Prednisone    Increased blood sugars too high      Medication List       Accurate as of 05/20/17 11:59 PM. Always use your most recent med list.          furosemide 20 MG tablet Commonly known as:  LASIX TAKE 1 TABLET BY MOUTH TWICE DAILY   guaiFENesin 600 MG 12 hr tablet Commonly known as:  MUCINEX Take 2 tablets (1,200 mg total) by mouth 2 (two) times daily as needed for to loosen phlegm.   lactulose 10 GM/15ML solution Commonly known as:  CHRONULAC TAKE 45ML BY  MOUTH TWICE DAILY AS NEEDED FOR MILD CONSTIPATION   LEVEMIR FLEXTOUCH 100 UNIT/ML Pen Generic drug:  Insulin Detemir INJECT 14 UNITS UNDER THE SKIN EVERY DAY AT 10PM   mometasone 50 MCG/ACT nasal spray Commonly known as:  NASONEX Place 2 sprays into the nose daily.   NOVOLOG FLEXPEN 100 UNIT/ML FlexPen Generic drug:  insulin aspart Inject into the skin. Sliding scale 121-150 - 2 units; 151-200 - 3 units; 201-250 -5 units; 251-300 - 8 units; 301-350 - 11units; 351-400 - 15 unitsc351-400 -15 units; greater than 400 -15 units and call MD.   omeprazole 20 MG capsule Commonly known as:  PRILOSEC TAKE 1 CAPSULE(20 MG) BY MOUTH DAILY   polyvinyl alcohol 1.4 % ophthalmic solution Commonly known as:  LUBRICANT DROPS Place 1 drop into both eyes as needed (dry eyes).   predniSONE 10 MG tablet Commonly known as:  DELTASONE Take 10 mg by mouth daily with breakfast. 2 po daily for 3 days then 1 po daily for 3 days then stop   rifaximin 550 MG Tabs tablet Commonly known as:  XIFAXAN Take 1 tablet (550 mg total) by mouth 2 (two) times daily.   Tiotropium Bromide-Olodaterol 2.5-2.5 MCG/ACT Aers Commonly known as:  STIOLTO RESPIMAT Inhale 2 puffs into the lungs daily.   vitamin C 500 MG tablet Commonly known as:   ASCORBIC ACID Take 1,000 mg by mouth 2 (two) times daily.   Vitamin D3 2000 units Tabs Take 2,000 Units by mouth every morning.        Vitals:   05/20/17 1035  BP: 122/72  Pulse: 66  Resp: 20  Temp: 98.3 F (36.8 C)  SpO2: 95%  Weight: 159 lb (72.1 kg)  Height: 5\' 6"  (1.676 m)   Body mass index is 25.66 kg/m.  Physical Exam  GENERAL APPEARANCE: Alert, conversant. No acute distress.  HEENT: Unremarkable. RESPIRATORY: Breathing is even, unlabored. Lung sounds are clear   CARDIOVASCULAR: Heart 3/6 systolic  Murmur, no rubs or gallops. No peripheral edema.  GASTROINTESTINAL: Abdomen is soft, non-tender, not distended w/ normal bowel sounds.  NEUROLOGIC: Cranial nerves 2-12 grossly intact. Moves all extremities; essential tremor   Labs reviewed: Basic Metabolic Panel:  Recent Labs  05/07/17 0318 05/08/17 0421 05/10/17 0505  NA 134* 137 137  K 5.5* 4.2 4.7  CL 101 103 102  CO2 25 25 28   GLUCOSE 316* 185* 228*  BUN 42* 46* 39*  CREATININE 1.30* 1.03* 0.86  CALCIUM 8.3* 8.4* 8.7*  MG  --  2.0  --    Lab Results  Component Value Date   MICROALBUR <0.7 02/25/2016   Liver Function Tests:  Recent Labs  04/13/17 1604 05/05/17 1853 05/06/17 0332  AST 40* 57* 52*  ALT 26 33 30  ALKPHOS 120* 157* 149*  BILITOT 2.8* 1.9* 1.7*  PROT 5.4* 5.9* 5.4*  ALBUMIN 2.6* 2.8* 2.4*   No results for input(s): LIPASE, AMYLASE in the last 8760 hours.  Recent Labs  03/11/17 1100 05/05/17 1855 05/10/17 1550  AMMONIA 65* 47* 48*   CBC:  Recent Labs  04/13/17 1604 05/05/17 1853 05/06/17 0332 05/10/17 0505  WBC 6.4 6.4 5.5 9.0  NEUTROABS 3.5 3.3 4.8  --   HGB 14.1 14.4 13.5 13.6  HCT 43.6 41.6 39.4 39.5  MCV 99.4 93.7 95.2 93.8  PLT 132.0* 110* 104* 98*   Lipid  Recent Labs  11/10/16 1439 02/09/17 1222  CHOL 164 160  HDL 40.60 48.40  LDLCALC 106* 96  TRIG 85.0 78.0  Cardiac Enzymes:  Recent Labs  05/05/17 1853  TROPONINI <0.03   BNP: No  results for input(s): BNP in the last 8760 hours. CBG:  Recent Labs  05/11/17 2208 05/12/17 0806 05/12/17 1236  GLUCAP 225* 129* 270*    Procedures and Imaging Studies During Stay: Dg Chest Port 1 View  Result Date: 05/05/2017 CLINICAL DATA:  Shortness of Breath EXAM: PORTABLE CHEST 1 VIEW COMPARISON:  01/17/2017 FINDINGS: There is hyperinflation of the lungs compatible with COPD. Heart and mediastinal contours are within normal limits. No focal opacities or effusions. No acute bony abnormality. IMPRESSION: COPD.  No active disease. Electronically Signed   By: Rolm Baptise M.D.   On: 05/05/2017 19:41    Assessment/Plan:   No diagnosis found.   Patient is being discharged with the following home health services:  OT/PT/nursing  Patient is being discharged with the following durable medical equipment:  None  Patient has been advised to f/u with their PCP in 1-2 weeks to bring them up to date on their rehab stay.  Social services at facility was responsible for arranging this appointment.  Pt was provided with a 30 day supply of prescriptions for medications and refills must be obtained from their PCP.  For controlled substances, a more limited supply may be provided adequate until PCP appointment only.  Medications have been reconciled.  Time spent greater than 30 minutes;> 50% of time with patient was spent reviewing records, labs, tests and studies, counseling and developing plan of care  Noah Delaine. Sheppard Coil, MD

## 2017-05-22 ENCOUNTER — Emergency Department (HOSPITAL_BASED_OUTPATIENT_CLINIC_OR_DEPARTMENT_OTHER)
Admission: EM | Admit: 2017-05-22 | Discharge: 2017-05-22 | Disposition: A | Discharge status: Home / Self Care | Payer: PPO | Attending: Emergency Medicine | Admitting: Emergency Medicine

## 2017-05-22 ENCOUNTER — Emergency Department (HOSPITAL_BASED_OUTPATIENT_CLINIC_OR_DEPARTMENT_OTHER): Payer: PPO

## 2017-05-22 ENCOUNTER — Encounter (HOSPITAL_BASED_OUTPATIENT_CLINIC_OR_DEPARTMENT_OTHER): Payer: Self-pay | Admitting: Emergency Medicine

## 2017-05-22 DIAGNOSIS — F17210 Nicotine dependence, cigarettes, uncomplicated: Secondary | ICD-10-CM | POA: Diagnosis not present

## 2017-05-22 DIAGNOSIS — Z79899 Other long term (current) drug therapy: Secondary | ICD-10-CM | POA: Insufficient documentation

## 2017-05-22 DIAGNOSIS — R0602 Shortness of breath: Secondary | ICD-10-CM | POA: Diagnosis not present

## 2017-05-22 DIAGNOSIS — R2240 Localized swelling, mass and lump, unspecified lower limb: Secondary | ICD-10-CM | POA: Insufficient documentation

## 2017-05-22 DIAGNOSIS — E119 Type 2 diabetes mellitus without complications: Secondary | ICD-10-CM | POA: Diagnosis not present

## 2017-05-22 DIAGNOSIS — I472 Ventricular tachycardia, unspecified: Secondary | ICD-10-CM | POA: Insufficient documentation

## 2017-05-22 DIAGNOSIS — J449 Chronic obstructive pulmonary disease, unspecified: Secondary | ICD-10-CM | POA: Diagnosis not present

## 2017-05-22 DIAGNOSIS — E785 Hyperlipidemia, unspecified: Secondary | ICD-10-CM | POA: Insufficient documentation

## 2017-05-22 DIAGNOSIS — Z794 Long term (current) use of insulin: Secondary | ICD-10-CM | POA: Insufficient documentation

## 2017-05-22 DIAGNOSIS — M7989 Other specified soft tissue disorders: Secondary | ICD-10-CM

## 2017-05-22 DIAGNOSIS — R2243 Localized swelling, mass and lump, lower limb, bilateral: Secondary | ICD-10-CM | POA: Diagnosis not present

## 2017-05-22 LAB — CBC
HEMATOCRIT: 36.2 % (ref 36.0–46.0)
HEMOGLOBIN: 12.9 g/dL (ref 12.0–15.0)
MCH: 32.3 pg (ref 26.0–34.0)
MCHC: 35.6 g/dL (ref 30.0–36.0)
MCV: 90.5 fL (ref 78.0–100.0)
Platelets: 68 10*3/uL — ABNORMAL LOW (ref 150–400)
RBC: 4 MIL/uL (ref 3.87–5.11)
RDW: 17.5 % — ABNORMAL HIGH (ref 11.5–15.5)
WBC: 12 10*3/uL — ABNORMAL HIGH (ref 4.0–10.5)

## 2017-05-22 LAB — BASIC METABOLIC PANEL
Anion gap: 8 (ref 5–15)
BUN: 32 mg/dL — AB (ref 6–20)
CHLORIDE: 97 mmol/L — AB (ref 101–111)
CO2: 29 mmol/L (ref 22–32)
CREATININE: 1 mg/dL (ref 0.44–1.00)
Calcium: 8.5 mg/dL — ABNORMAL LOW (ref 8.9–10.3)
GFR calc non Af Amer: 54 mL/min — ABNORMAL LOW (ref >60)
GLUCOSE: 182 mg/dL — AB (ref 65–99)
Potassium: 4 mmol/L (ref 3.5–5.1)
Sodium: 134 mmol/L — ABNORMAL LOW (ref 135–145)

## 2017-05-22 NOTE — ED Notes (Signed)
Pt on automatic VS and continuous pulse ox.

## 2017-05-22 NOTE — ED Notes (Signed)
Pt given microwave meal to eat.

## 2017-05-22 NOTE — ED Provider Notes (Signed)
[facility] EMERGENCY DEPARTMENT Provider Note   CSN: [id number] Arrival date & time: 05/22/17  1544     History   Chief Complaint Chief Complaint  Patient presents with  . Shortness of Breath    HPI Ashley Savage is a 76 y.o. female.  HPI   Ashley Savage is a 76 year old female with a history of cirrhosis, COPD, insulin-dependent diabetes mellitus, AAA, right knee arthritis, tobacco use, overactive bladder who presents to the emergency department for evaluation of bilateral lower extremity swelling. Patient states that she takes Lasix and spironolactone for her lower extremity swelling chronically, these medications are managed by her gastroenterologist. She states that despite taking this medication she has had increased bilateral leg swelling over the past 4 days. Reports that she has not been wearing her compression stockings which could be contributing to the problem. Thinks the swelling may be spreading to her abdomen. States that her legs feel "tight and full of water." She has a follow up appointment with her gastroenterologist in two days. Denies lower extremity pain, abdominal pain, nausea/vomiting/diarrhea, fever, shortness of breath, cough, chest pain, numbness, weakness. She is able to ambulate independently with a cane.   Past Medical History:  Diagnosis Date  . Abdominal aortic aneurysm ([place]) 10/01/2013   Fall of 2014 3.3 per patient, follows with Vascular surgeon.   . Acute respiratory failure with hypoxia ([place]) 07/31/2015  . Allergic state 11/10/2016  . Anxiety   . Anxiety and depression 02/01/2014  . Arthritis of both knees 10/01/2013  . Arthritis of right knee 10/01/2013   Follows with Dr Mayer Camel   . Benign paroxysmal positional vertigo 10/01/2013  . Breast cancer ([place])    No disease activity On Femara Follows with Dr Marin Olp Right mastectomy performed by Dr Autumn Messing   . Cancer [facility]) breast ca  right  . Chronic respiratory failure ([place]) 08/29/2015  . Cirrhosis of  liver without ascites ([place]) 07/31/2015  . COPD (chronic obstructive pulmonary disease) ([place]) 10/01/2013  . COPD with acute exacerbation ([place]) 05/05/2017  . Depression   . Dermatitis 03/30/2017  . Diabetes mellitus type 2  . Diabetes mellitus type 2, controlled ([place]) 10/16/2014  . Emphysema   . Encephalopathy, hepatic ([place]) 06/07/2014  . Esophageal reflux 10/01/2013  . Fall 07/30/2016  . Hyperlipidemia   . Hyperlipidemia, mixed   . Increased ammonia level 11/25/2014  . NASH (nonalcoholic steatohepatitis) 08/26/2015  . Neck pain 10/01/2013  . Neuropathy    feet   . Osteopenia 03/30/2017  . Overactive bladder 12/10/2013  . Panic attacks   . Pedal edema 12/10/2013  . Personal history of radiation therapy   . Preventative health care 03/08/2016  . Thrombocytopenia ([place]) 03/17/2012  . Tobacco abuse disorder 02/01/2014  . Type 2 diabetes mellitus with hyperglycemia, with long-term current use of insulin ([place])   . Urine frequency 04/13/2017    Patient Active Problem List   Diagnosis Date Noted  . Ventricular tachycardia ([place]) 05/22/2017  . Acute kidney injury ([place]) 05/17/2017  . Hyperkalemia 05/17/2017  . Vitamin D deficiency 05/17/2017  . COPD with acute exacerbation ([place]) 05/05/2017  . Urine frequency 04/13/2017  . Osteopenia 03/30/2017  . Dermatitis 03/30/2017  . Allergic state 11/10/2016  . 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 Portal hypertensive gastropathy ([place])   . Lower back injury, initial encounter 08/05/2016  . Fall 07/30/2016  . Preventative health care 03/08/2016  . Muscle spasm 02/25/2016  . Chronic respiratory failure ([place]) 08/29/2015  . NASH (  nonalcoholic steatohepatitis) 08/26/2015  . Type 2 diabetes mellitus with hyperglycemia, with long-term current use of insulin ([place])   . Cirrhosis of liver without ascites ([place]) 07/31/2015  . Acute respiratory failure with hypoxia ([place]) 07/31/2015  . Diarrhea 12/30/2014  . Increased ammonia level 11/25/2014  . Diabetes mellitus type 2,  controlled ([place]) 10/16/2014  . Encephalopathy, hepatic ([place]) 06/07/2014  . Right knee pain 06/07/2014  . Sun-damaged skin 02/01/2014  . Anxiety and depression 02/01/2014  . Tobacco abuse 02/01/2014  . Medicare annual wellness visit, subsequent 02/01/2014  . Pedal edema 12/10/2013  . Overactive bladder 12/10/2013  . Abdominal aortic aneurysm ([place]) 10/01/2013  . Arthritis of right knee 10/01/2013  . Benign paroxysmal positional vertigo 10/01/2013  . Neck pain 10/01/2013  . Esophageal reflux 10/01/2013  . Thrombocytopenia ([place]) 03/17/2012  . 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   . Hyperlipidemia, mixed   . Breast cancer [facility])     Past Surgical History:  Procedure Laterality Date  . APPENDECTOMY  2007  . BREAST SURGERY  2009 right  . CATARACT EXTRACTION     x 2  . ESOPHAGOGASTRODUODENOSCOPY (EGD) WITH PROPOFOL N/A 08/14/2016   Procedure: ESOPHAGOGASTRODUODENOSCOPY (EGD) WITH PROPOFOL;  Surgeon: Mauri Pole, MD;  Location: [facility] ENDOSCOPY;  Service: Endoscopy;  Laterality: N/A;  . [place]  . KNEE SURGERY    . MANDIBLE FRACTURE SURGERY    . MASTECTOMY    . PILONIDAL CYST EXCISION    . TONSILLECTOMY      OB History    No data available       Home Medications    Prior to Admission medications   Medication Sig Start Date End Date Taking? Authorizing Provider  Cholecalciferol (VITAMIN D3) 2000 units TABS Take 2,000 Units by mouth every morning.    [provider]  furosemide (LASIX) 20 MG tablet TAKE 1 TABLET BY MOUTH TWICE DAILY 04/15/17   Nandigam, Venia Minks, MD  guaiFENesin (MUCINEX) 600 MG 12 hr tablet Take 2 tablets (1,200 mg total) by mouth 2 (two) times daily as needed for to loosen phlegm. 05/11/17   Hosie Poisson, MD  insulin aspart (NOVOLOG FLEXPEN) 100 UNIT/ML FlexPen Inject into the skin. Sliding scale 121-150 - 2 units; 151-200 - 3 units; 201-250 -5 units; 251-300 - 8 units; 301-350 - 11units; 351-400  - 15 unitsc351-400 -15 units; greater than 400 -15 units and call MD.    [provider]  lactulose (CHRONULAC) 10 GM/15ML solution TAKE 45ML BY MOUTH TWICE DAILY AS NEEDED FOR MILD CONSTIPATION 03/19/17   Mosie Lukes, MD  LEVEMIR FLEXTOUCH 100 UNIT/ML Pen INJECT 14 UNITS UNDER THE SKIN EVERY DAY AT 10PM 04/08/17   Mosie Lukes, MD  mometasone (NASONEX) 50 MCG/ACT nasal spray Place 2 sprays into the nose daily. 03/30/17   Mosie Lukes, MD  omeprazole (PRILOSEC) 20 MG capsule TAKE 1 CAPSULE(20 MG) BY MOUTH DAILY 01/26/17   Mosie Lukes, MD  polyvinyl alcohol (LUBRICANT DROPS) 1.4 % ophthalmic solution Place 1 drop into both eyes as needed (dry eyes). 05/11/17   Hosie Poisson, MD  predniSONE (DELTASONE) 10 MG tablet Take 10 mg by mouth daily with breakfast. 2 po daily for 3 days then 1 po daily for 3 days then stop    [provider]  rifaximin (XIFAXAN) 550 MG TABS tablet Take 1 tablet (550 mg total) by mouth 2 (two) times daily. 09/04/16   Esterwood, Amy S, PA-C  Tiotropium Bromide-Olodaterol (  STIOLTO RESPIMAT) 2.5-2.5 MCG/ACT AERS Inhale 2 puffs into the lungs daily. 03/15/17   Rigoberto Noel, MD  vitamin C (ASCORBIC ACID) 500 MG tablet Take 1,000 mg by mouth 2 (two) times daily.    [provider]    Family History Family History  Problem Relation Age of Onset  . Heart failure Father   . COPD Father   . Arthritis Father 80  . Stroke Mother   . Arthritis Mother 63  . Hyperlipidemia Mother   . Hypertension Mother   . Diabetes Mother   . Diabetes Sister   . Breast cancer Unknown   . Breast cancer Maternal Aunt   . Asthma Maternal Aunt   . Birth defects Maternal Aunt   . Alcohol abuse Maternal Uncle   . Breast cancer Maternal Aunt     Social History Social History  Substance Use Topics  . Smoking status: Current Some Day Smoker    Packs/day: 0.25    Years: 58.00    Types: Cigarettes    Start date: 07/27/1964  . Smokeless tobacco: Never Used  .  Alcohol use No     Allergies   Citalopram; Ciprofloxacin; Erythromycin; Glimepiride; and Prednisone   Review of Systems Review of Systems  Constitutional: Negative for chills, fatigue and fever.  HENT: Negative for congestion.   Eyes: Negative for visual disturbance.  Respiratory: Negative for cough and shortness of breath.   Cardiovascular: Positive for leg swelling (bilateral). Negative for chest pain.  Gastrointestinal: Negative for abdominal pain, diarrhea, nausea and vomiting.  Genitourinary: Positive for frequency (she states this is from lasix). Negative for difficulty urinating, dysuria, flank pain, hematuria and urgency.  Musculoskeletal: Negative for back pain.  Skin: Negative for rash and wound.  Neurological: Negative for weakness, numbness and headaches.  Psychiatric/Behavioral: Negative for agitation.     Physical Exam Updated Vital Signs BP (!) 119/52 (BP Location: Right Arm)   Pulse 87   Temp 97.8 F (36.6 C) (Oral)   Resp 14   Ht 5\' 6"  (1.676 m)   Wt 72.1 kg (159 lb)   LMP  (LMP Unknown)   SpO2 93%   BMI 25.66 kg/m   Physical Exam  Constitutional: She is oriented to person, place, and time. She appears well-developed and well-nourished.  Relaxed, calm at bedside. No acute distress.   HENT:  Head: Normocephalic and atraumatic.  Mouth/Throat: Oropharynx is clear and moist. No oropharyngeal exudate.  Eyes: Pupils are equal, round, and reactive to light. Conjunctivae are normal. Right eye exhibits no discharge. Left eye exhibits no discharge.  Neck: Normal range of motion. Neck supple.  Cardiovascular: Normal rate, regular rhythm and intact distal pulses.  Exam reveals no friction rub.   No murmur heard. Heart sounds distant  Pulmonary/Chest: Effort normal and breath sounds normal. No respiratory distress. She has no wheezes. She has no rales.  Pulse ox 98% on room air  Abdominal: Soft. Bowel sounds are normal. There is no tenderness. There is no  guarding.  Tympanic to percussion in all four quadrants. No fluid wave. No tenderness to palpation, guarding or rigidity.   Musculoskeletal: Normal range of motion. She exhibits deformity.  Bilateral 2+ pitting edema up to the knee joint. Ruddy color of the anterior lower legs bilaterally, no open wound. No calf or thigh tenderness. DP pulses 2+ bilaterally.   Lymphadenopathy:    She has no cervical adenopathy.  Neurological: She is alert and oriented to person, place, and time. Coordination normal.  Distal  sensation to light touch intact in bilateral LE. Gait normal, good balance. Patient uses walker for assistance with ambulation.   Skin: Skin is warm and dry. Capillary refill takes less than 2 seconds. She is not diaphoretic.  Psychiatric: She has a normal mood and affect. Her behavior is normal.  Nursing note and vitals reviewed.    ED Treatments / Results  Labs (all labs ordered are listed, but only abnormal results are displayed) Labs Reviewed  CBC - Abnormal; Notable for the following:       Result Value   WBC 12.0 (*)    RDW 17.5 (*)    Platelets 68 (*)    All other components within normal limits  BASIC METABOLIC PANEL - Abnormal; Notable for the following:    Sodium 134 (*)    Chloride 97 (*)    Glucose, Bld 182 (*)    BUN 32 (*)    Calcium 8.5 (*)    GFR calc non Af Amer 54 (*)    All other components within normal limits    EKG  EKG Interpretation None       Radiology Dg Chest 2 View  Result Date: 05/22/2017 CLINICAL DATA:  Shortness of breath and extremity swelling. EXAM: CHEST  2 VIEW COMPARISON:  Chest x-ray dated May 05, 2017. FINDINGS: The cardiomediastinal silhouette is normal in size. Normal pulmonary vascularity. The lungs remain hyperinflated with slightly coarsened interstitial markings. No focal consolidation, pleural effusion, or pneumothorax. No acute osseous abnormality. IMPRESSION: COPD.  No active cardiopulmonary disease. Electronically  Signed   By: Titus Dubin M.D.   On: 05/22/2017 18:23    Procedures Procedures (including critical care time)  Medications Ordered in ED Medications - No data to display   Initial Impression / Assessment and Plan / ED Course  I have reviewed the triage vital signs and the nursing notes.  Pertinent labs & imaging results that were available during my care of the patient were reviewed by me and considered in my medical decision making (see chart for details).  Clinical Course as of May 22 2152  Sat May 22, 2017  2149 Patient clothed in hallway stating she's ready to leave now.   [ES]    Clinical Course User Index [ES] Glyn Ade, PA-C    Patient presents with increased bilateral leg swelling over the past four days. Of note she is on lasix and spironolactone for fluid overload for her chronic cirrhosis. Do not suspect DVT given no calf or thigh tenderness and bilateral symmetric swelling. Abdomen non-distended without fluid wave. Suspect this her symptoms are related to the fact that she stopped wearing her compression stockings over the past several days.   Basic labs reveal mild leukocytosis (WBC 12.0). Do not suspect infectious process given exam and vitals. Her platelet count is low (68), although this appears to be chronic. Have informed patient and counseled her to follow up on this with her GI doctor. BMP unremarkable, patient has good kidney function (Cr 100). Her glucose is elevated, have counseled her to have this managed at her primary care office and patient agrees.   Given patient's kidney function good and no electrolyte deficiencies, will increase patient's lasix dose for the next two days until she is seen by her gastroenterologist in two days for further management. Have also counseled her to resume wearing her compression stockings. Discussed return precautions. Patient agrees and voices understanding to the plan. Discussed this patient with Dr. Wilson Singer who also  agrees  with plan to discharge.    Final Clinical Impressions(s) / ED Diagnoses   Final diagnoses:  Leg swelling    New Prescriptions New Prescriptions   No medications on file     Bernarda Caffey 05/24/17 1057    Virgel Manifold, MD 05/25/17 1444

## 2017-05-22 NOTE — ED Notes (Signed)
Patient started to get irritated and wanting to go home.  Offered microwaveable meals and was very happy to accept the offer.

## 2017-05-22 NOTE — Discharge Instructions (Signed)
Please double your lasix today and tomorrow to help with your lower leg swelling. Please wear your compression stockings when you get home.   Please go to your appointment with the GI doctor Monday to discuss your lasix dose.   Your labs today showed that you have low platelets, likely from your ongoing Cirrhosis. Please follow up with your GI doctor Monday regarding this.  Return to the ER if you develop shortness of breath, chest pain or any other new or worsening symptoms.

## 2017-05-22 NOTE — ED Triage Notes (Addendum)
Patient states that she just left rehab yesterday and d/cd home. The patient has a pile of Rx that were not filled yesterday and has not started to take her medications yet. The patient states that she sat out too long yesterday and now she is SOB - the patient states that her o2 level is in the 80's - patient in triage is 95 - 98 % and patient is talking without difficulty  - pateint reports that she is having swelling to her bilateral lower extremities and hand - she reports she has swelling everywhere " even my boobies"

## 2017-05-22 NOTE — ED Notes (Signed)
ED Provider at bedside. 

## 2017-05-24 ENCOUNTER — Other Ambulatory Visit: Payer: Self-pay | Admitting: *Deleted

## 2017-05-24 ENCOUNTER — Ambulatory Visit (INDEPENDENT_AMBULATORY_CARE_PROVIDER_SITE_OTHER): Payer: PPO | Admitting: Gastroenterology

## 2017-05-24 ENCOUNTER — Other Ambulatory Visit: Payer: Self-pay

## 2017-05-24 ENCOUNTER — Other Ambulatory Visit (INDEPENDENT_AMBULATORY_CARE_PROVIDER_SITE_OTHER): Payer: PPO

## 2017-05-24 ENCOUNTER — Encounter: Payer: Self-pay | Admitting: Gastroenterology

## 2017-05-24 ENCOUNTER — Telehealth: Payer: Self-pay | Admitting: Gastroenterology

## 2017-05-24 VITALS — BP 120/60 | HR 76 | Ht 66.0 in | Wt 172.0 lb

## 2017-05-24 DIAGNOSIS — K7581 Nonalcoholic steatohepatitis (NASH): Secondary | ICD-10-CM

## 2017-05-24 DIAGNOSIS — R188 Other ascites: Secondary | ICD-10-CM

## 2017-05-24 DIAGNOSIS — R601 Generalized edema: Secondary | ICD-10-CM

## 2017-05-24 DIAGNOSIS — I272 Pulmonary hypertension, unspecified: Secondary | ICD-10-CM

## 2017-05-24 DIAGNOSIS — K7469 Other cirrhosis of liver: Secondary | ICD-10-CM

## 2017-05-24 DIAGNOSIS — K7290 Hepatic failure, unspecified without coma: Secondary | ICD-10-CM

## 2017-05-24 DIAGNOSIS — K7682 Hepatic encephalopathy: Secondary | ICD-10-CM

## 2017-05-24 LAB — COMPREHENSIVE METABOLIC PANEL
ALBUMIN: 2.7 g/dL — AB (ref 3.5–5.2)
ALT: 35 U/L (ref 0–35)
AST: 43 U/L — AB (ref 0–37)
Alkaline Phosphatase: 108 U/L (ref 39–117)
BILIRUBIN TOTAL: 4.3 mg/dL — AB (ref 0.2–1.2)
BUN: 30 mg/dL — ABNORMAL HIGH (ref 6–23)
CALCIUM: 9 mg/dL (ref 8.4–10.5)
CO2: 32 mEq/L (ref 19–32)
CREATININE: 0.98 mg/dL (ref 0.40–1.20)
Chloride: 100 mEq/L (ref 96–112)
GFR: 58.94 mL/min — ABNORMAL LOW (ref >60.00)
Glucose, Bld: 162 mg/dL — ABNORMAL HIGH (ref 70–99)
Potassium: 4.3 mEq/L (ref 3.5–5.1)
Sodium: 140 mEq/L (ref 135–145)
TOTAL PROTEIN: 5.3 g/dL — AB (ref 6.0–8.3)

## 2017-05-24 LAB — PROTIME-INR
INR: 1.2 ratio — AB (ref 0.8–1.0)
PROTHROMBIN TIME: 12.8 s (ref 9.6–13.1)

## 2017-05-24 MED ORDER — SPIRONOLACTONE 100 MG PO TABS: 100.0000 mg | ORAL_TABLET | Freq: Every day | ORAL | 3 refills | Status: DC

## 2017-05-24 MED ORDER — FUROSEMIDE 40 MG PO TABS: 40.0000 mg | ORAL_TABLET | Freq: Every day | ORAL | 3 refills | Status: DC

## 2017-05-24 NOTE — Progress Notes (Addendum)
Ashley Savage    [id number]    [birth year]  Primary Care Physician:Blyth, Bonnita Levan, MD  Referring Physician: Mosie Lukes, MD [address] [place],  [address]  Chief complaint: Lower extremity edema, shortness of breath HPI: 76 year old female with history of [name] cirrhosis, insulin-dependent diabetes, COPD status post recent hospitalization May 05, 2017 to May 12, 2017 with COPD exacerbation, was discharged to short-term rehab on prednisone taper.  She presented to the ER May 22, 2017 with worsening swelling of both legs and shortness of breath.  Patient thinks during hospitalization spironolactone was dropped from her medication list and she was not getting it at the rehab either.  She was only taking Lasix 20 mg twice daily , was advised by ER on October 27 to increase Lasix dose to 40 mg twice daily.  Labs were stable.  She is not sure if she was getting low-salt diet at rehab.  Denies any abdominal pain, abdominal distention, nausea, vomiting, melena or blood per rectum.  She will complete her prednisone taper later this week. Her weight has increased from 145 pounds on discharge from hospital May 12 2017 to 172 pounds today  Echocardiogram May 08, 2017 showed normal LV function with EF 65-70% with, pseudo-normal left ventricular filling pattern with abnormal relaxation and increased filling pressure grade 2 diastolic dysfunction.  Pulmonary artery peak pressure 47 mmHg.  Right ventricular systolic pressure increased consistent with moderate pulmonary hypertension.      Outpatient Encounter Prescriptions as of 05/24/2017  Medication Sig  . albuterol (ACCUNEB) 1.25 MG/3ML nebulizer solution as directed.  . budesonide (PULMICORT) 0.25 MG/2ML nebulizer solution as directed.  . Cholecalciferol (VITAMIN D3) 2000 units TABS Take 2,000 Units by mouth every morning.  . furosemide (LASIX) 20 MG tablet TAKE 1 TABLET BY MOUTH TWICE DAILY    . guaiFENesin (MUCINEX) 600 MG 12 hr tablet Take 2 tablets (1,200 mg total) by mouth 2 (two) times daily as needed for to loosen phlegm.  . insulin aspart (NOVOLOG FLEXPEN) 100 UNIT/ML FlexPen Inject into the skin. Sliding scale 121-150 - 2 units; 151-200 - 3 units; 201-250 -5 units; 251-300 - 8 units; 301-350 - 11units; 351-400 - 15 unitsc351-400 -15 units; greater than 400 -15 units and call MD.  . lactulose (CHRONULAC) 10 GM/15ML solution TAKE 45ML BY MOUTH TWICE DAILY AS NEEDED FOR MILD CONSTIPATION  . LEVEMIR FLEXTOUCH 100 UNIT/ML Pen INJECT 14 UNITS UNDER THE SKIN EVERY DAY AT 10PM  . mometasone (NASONEX) 50 MCG/ACT nasal spray Place 2 sprays into the nose daily.  Marland Kitchen omeprazole (PRILOSEC) 20 MG capsule TAKE 1 CAPSULE(20 MG) BY MOUTH DAILY  . polyvinyl alcohol (LUBRICANT DROPS) 1.4 % ophthalmic solution Place 1 drop into both eyes as needed (dry eyes).  . predniSONE (DELTASONE) 10 MG tablet Take 10 mg by mouth daily with breakfast. 2 po daily for 3 days then 1 po daily for 3 days then stop  . rifaximin (XIFAXAN) 550 MG TABS tablet Take 1 tablet (550 mg total) by mouth 2 (two) times daily.  . Tiotropium Bromide-Olodaterol (STIOLTO RESPIMAT) 2.5-2.5 MCG/ACT AERS Inhale 2 puffs into the lungs daily.  . [DISCONTINUED] vitamin C (ASCORBIC ACID) 500 MG tablet Take 1,000 mg by mouth 2 (two) times daily.   No facility-administered encounter medications on file as of 05/24/2017.     Allergies as of 05/24/2017 - Review Complete 05/24/2017  Allergen Reaction Noted  . Citalopram Palpitations 04/22/2012  .  Ciprofloxacin  03/30/2017  . Erythromycin Other (See Comments) 02/26/2011  . Glimepiride  02/03/2015  . Prednisone  02/27/2016    Past Medical History:  Diagnosis Date  . Abdominal aortic aneurysm ([place]) 10/01/2013   Fall of 2014 3.3 per patient, follows with Vascular surgeon.   . Acute respiratory failure with hypoxia ([place]) 07/31/2015  . Allergic state 11/10/2016  . Anxiety   . Anxiety and  depression 02/01/2014  . Arthritis of both knees 10/01/2013  . Arthritis of right knee 10/01/2013   Follows with Dr Mayer Camel   . Benign paroxysmal positional vertigo 10/01/2013  . Breast cancer ([place])    No disease activity On Femara Follows with Dr Marin Olp Right mastectomy performed by Dr Autumn Messing   . Cancer [facility]) breast ca  right  . Chronic respiratory failure ([place]) 08/29/2015  . Cirrhosis of liver without ascites ([place]) 07/31/2015  . COPD (chronic obstructive pulmonary disease) ([place]) 10/01/2013  . COPD with acute exacerbation ([place]) 05/05/2017  . Depression   . Dermatitis 03/30/2017  . Diabetes mellitus type 2  . Diabetes mellitus type 2, controlled ([place]) 10/16/2014  . Emphysema   . Encephalopathy, hepatic ([place]) 06/07/2014  . Esophageal reflux 10/01/2013  . Fall 07/30/2016  . Hyperlipidemia   . Hyperlipidemia, mixed   . Increased ammonia level 11/25/2014  . NASH (nonalcoholic steatohepatitis) 08/26/2015  . Neck pain 10/01/2013  . Neuropathy    feet   . Osteopenia 03/30/2017  . Overactive bladder 12/10/2013  . Panic attacks   . Pedal edema 12/10/2013  . Personal history of radiation therapy   . Preventative health care 03/08/2016  . Thrombocytopenia ([place]) 03/17/2012  . Tobacco abuse disorder 02/01/2014  . Type 2 diabetes mellitus with hyperglycemia, with long-term current use of insulin ([place])   . Urine frequency 04/13/2017    Past Surgical History:  Procedure Laterality Date  . APPENDECTOMY  2007  . BREAST SURGERY  2009 right  . CATARACT EXTRACTION     x 2  . ESOPHAGOGASTRODUODENOSCOPY (EGD) WITH PROPOFOL N/A 08/14/2016   Procedure: ESOPHAGOGASTRODUODENOSCOPY (EGD) WITH PROPOFOL;  Surgeon: Mauri Pole, MD;  Location: [facility];  Service: Endoscopy;  Laterality: N/A;  . [place]  . KNEE SURGERY    . MANDIBLE FRACTURE SURGERY    . MASTECTOMY    . PILONIDAL CYST EXCISION    . TONSILLECTOMY      Family History  Problem Relation Age of Onset  . Heart failure Father   . COPD  Father   . Arthritis Father 57  . Stroke Mother   . Arthritis Mother 26  . Hyperlipidemia Mother   . Hypertension Mother   . Diabetes Mother   . Diabetes Sister   . Breast cancer Unknown   . Breast cancer Maternal Aunt   . Asthma Maternal Aunt   . Birth defects Maternal Aunt   . Alcohol abuse Maternal Uncle   . Breast cancer Maternal Aunt     Social History   Social History  . Marital status: Single    Spouse name: N/A  . Number of children: 0  . Years of education: N/A   Occupational History  . retired Retired   Social History Main Topics  . Smoking status: Current Some Day Smoker    Packs/day: 0.25    Years: 58.00    Types: Cigarettes    Start date: 07/27/1964  . Smokeless tobacco: Never Used  . Alcohol use No  . Drug use: No  . Sexual  activity: Not on file   Other Topics Concern  . Not on file   Social History Narrative   Lives alone, continues to smoke, no dietary restrictions      Review of systems: Review of Systems  Constitutional: Negative for fever and chills.  HENT: Positive for nosebleeds and difficulty with hearing   Eyes: Negative for blurred vision.  Respiratory: Negative for cough, shortness of breath and wheezing.   Cardiovascular: Negative for chest pain and palpitations.  Gastrointestinal: as per HPI Genitourinary: Negative for dysuria, urgency, frequency and hematuria.  Musculoskeletal: Positive for myalgias, back pain and joint pain.  Skin: Negative for itching and rash.  Neurological: Negative for dizziness, tremors, focal weakness, seizures and loss of consciousness.  Endo/Heme/Allergies: Positive for seasonal allergies.  Psychiatric/Behavioral: Negative for depression, suicidal ideas and hallucinations.  All other systems reviewed and are negative.   Physical Exam: Vitals:   05/24/17 1005  BP: 120/60  Pulse: 76   Body mass index is 27.76 kg/m. Gen:      No acute distress HEENT:  EOMI, sclera anicteric Neck:     No masses;  no thyromegaly Lungs:    Clear to auscultation bilaterally; normal respiratory effort CV:         Regular rate and rhythm; no murmurs Abd:      + bowel sounds; soft, non-tender; no palpable masses, no distension, no fluid shift Ext:    ++edema with anasarca of all extremities and even abdominal wall and groin area; adequate peripheral perfusion Skin:      Warm and dry; no rash Neuro: alert and oriented x 3, no asterixis Psych: normal mood and affect  Data Reviewed:  Reviewed labs, radiology imaging, old records and pertinent past GI work up   Assessment and Plan/Recommendations:  76 year old female with history of [place] cirrhosis decompensated by hepatic encephalopathy here for follow-up visit after recent hospitalization with COPD exacerbation, discharged on prednisone taper with worsening volume status and anasarca No significant ascites based on abdominal exam Patient did have moderate pulmonary hypertension based on echo, could be contributing to her worsening shortness of breath and peripheral edema.  She has a follow-up appointment with Dr. Elsworth Soho on October 31 Will request diagnostic paracentesis, if has a pocket of fluid that can be sampled, check total protein, albumin and cell count to see if it is related to cardiac ascites or cirrhosis Advised patient to take furosemide 40 mg daily and Spironolactone 100 mg daily Check CMP, PT/INR and AFP today and recheck BMP in 5 days   Hepatic encephalopathy: Stable Continue rifaximin and lactulose with goal 2-3 bowel movements daily.  Advised patient to hold lactulose if she is having more than 3 bowel movements in a day.  Esophageal varices screening: Small based on EGD January 2018 Due surveillance 2020  Return in 4 weeks or sooner if needed  Greater than 50% of the time used for counseling as well as treatment plan and follow-up. She had multiple questions which were answered to her satisfaction  K. Denzil Magnuson , MD [phone]  Mon-Fri 8a-5p [phone] after 5p, weekends, holidays  CC: Mosie Lukes, MD

## 2017-05-24 NOTE — Patient Instructions (Signed)
You have been scheduled for an abdominal paracentesis at [facility] Radiology     radiology (1st floor of hospital) on 05/27/2017 at 12:30pm. Please arrive at least 15 minutes prior to your appointment time for registration. Should you need to reschedule this appointment for any reason, please call our office at    You have been scheduled for an abdominal ultrasound at [facility] Radiology  (1st floor of hospital) on 05/27/2017 at 12:30pm. Please arrive 15 minutes prior to your appointment for registration. Make certain not to have anything to eat or drink 8 hours prior to your appointment. Should you need to reschedule your appointment, please contact radiology at [phone]. This test typically takes about 30 minutes to perform.  Go to the basement for labs today  You will come back to our lab on Friday to have a BMET drawn

## 2017-05-24 NOTE — Patient Outreach (Addendum)
[facility]) Care Management  05/24/2017  ORLENA GARMON 1941/03/28 [id number]   Initial Transition of care contact post SNF discharged on 10/26  RN able to speak with pt today and introduced the [facility] services and purpose for today's call. RN verified pt identifiers and further discussed possible needs. Pt explained her medical condition most related to chronic pain however states she is managing her COPD and reports she is in the GREEN zone today. Pt states she is aware that her recent hospitalization relates to her "COPD flare up" but feels she is able to manage this condition much better at this time. States she was suppose to have [facility] discharging with home O2 but she improved and did not have to have home O2 so she may or may not received [facility]. RN states she continues to drive so she has transportation to and from all her appointments with no delays.Attempted to review all medications however pt states she needs to find the discharge sheet and agree to a cal back for tomorrow morning to discuss and verify further tomorrow. Will follow up tomorrow morning as requested to further review medications. Also review the COPD action zone and again verified pt's awareness of who to contact if acute issues arise. Transition of care template completed however will review the medications tomorrow as requested. RN also discussed [facility] services related to available pharmacy and social workers if needed for community resources. Also offered community home visit from this RN to further engage in her ongoing recovery from her recent discharge. Pt apprehensive but receptive to the ongoing weekly transition of care calls. RN will continue to offer this assistance throughout the continuum of care with ongoing transition of care calls. Will scheduled call for tomorrow as discussed today.  Pt had an ED visit on 10/27 as RN inquired and pt reports she had some swelling issues but this related to her  liver issues (history). Pt states she was treated and released home and followed up with her provider today. No further issues related to this incident.  [name], RN Care Management Coordinator [place] Office [phone]

## 2017-05-24 NOTE — Patient Outreach (Signed)
[facility]) Care Management  05/24/2017  LACOLE KOMOROWSKI [birth year] [id number]   CSW made an initial attempt to try and contact patient by phone today, as CSW is aware that patient was discharged from [place] where patient was residing to receive short-term rehabilitative services; however, patient was unavailable.  A HIPAA compliant message was left for patient on voicemail and CSW is currently awaiting a return call.  CSW will make a second outreach attempt within the next 24 hours, if CSW does not receive a return call from patient in the meantime. Nat Christen, BSW, MSW, LCSW  Licensed Education officer, environmental Health System  Mailing [place] [address], [place], [place] [address] Physical Address-[address], [place], [place] [address] Toll Free Main # [phone] Fax # [phone] Cell # [phone]  Office # [phone] [email or web address]

## 2017-05-25 ENCOUNTER — Other Ambulatory Visit: Payer: Self-pay | Admitting: *Deleted

## 2017-05-25 LAB — AFP TUMOR MARKER: AFP-Tumor Marker: 3 ng/mL

## 2017-05-25 NOTE — Patient Outreach (Signed)
[facility]) Care Management  05/25/2017  Ashley Savage 1941/03/15 [id number]   Transition of care with review of medications  RN spoke with pt today and verified identifiers prior to completing the transition of care process with review of medications. Pt states she continue to do well however lots of questions concerning medications. Pt states she is pending an appointment with her primary care provider Dr. Charlett Blake for tomorrow and will review all her medications at that time. RN strongly encouraged pt to take only the review medications today and if there are any questions on her other medications to please verify and confirm with her provider tomorrow (pt with clear understanding). Will continue to offer home visits for a more one-on-one consult however pt only receptive to ongoing transition of care calls.  Pt verified she will not receive [facility] services with no needs at this time. RN offered to continue to follow pt with another call next week however if needed sooner to contact this RN directly.  Will scheduled accordingly and follow up next week as plan of care and goals discussed accordingly.  Patient was recently discharged from hospital and all medications have been reviewed.  [name], RN Care Management Coordinator [place] Office [phone]

## 2017-05-25 NOTE — Telephone Encounter (Signed)
Orders done

## 2017-05-25 NOTE — Patient Outreach (Signed)
[facility]) Care Management  05/25/2017  Ashley Savage 12-16-1940 [id number]   CSW was able to make contact with patient today to follow-up regarding patient's recent discharge from [place] where patient was residing to receive short-term rehabilitative services.  Patient admits she is doing well, "getting back into the routine of things at home".  Patient denied needing any home health services and/or durable medical equipment for home use.  Patient was unable to identify any additional social work needs at present. CSW will perform a case closure on patient, as all goals of treatment have been met from social work standpoint.  CSW will notify patient's RNCM with [facility] [facility], Ashley Savage of CSW's plans to close patient's case.  CSW will fax an update to patient's Primary Care Physician, Dr. Penni Homans to ensure that they are aware of CSW's involvement with patient's plan of care.  CSW will submit a case closure request to Ashley Savage, Care Management Assistant with [place] [facility], in the form of an [employer].  CSW will ensure that Mrs. Ashley Savage is aware of Ashley Savage, RNCM with [place] [facility], continued involvement with patient's care. Ashley Savage, Ashley Savage, Ashley Savage, Ashley Savage  Licensed Education officer, environmental Health System  Mailing [place] [address], [place], [place] [address] Physical Address-[address], [place], [place] [address] Toll Free Main # [phone] Fax # [phone] Cell # [phone]  Office # [phone] [email or web address]

## 2017-05-26 ENCOUNTER — Ambulatory Visit (INDEPENDENT_AMBULATORY_CARE_PROVIDER_SITE_OTHER): Payer: PPO | Admitting: Medical

## 2017-05-26 VITALS — BP 120/60 | HR 78 | Temp 97.4°F | Resp 16 | Ht 66.0 in | Wt 167.6 lb

## 2017-05-26 DIAGNOSIS — K7581 Nonalcoholic steatohepatitis (NASH): Secondary | ICD-10-CM | POA: Diagnosis not present

## 2017-05-26 DIAGNOSIS — R0600 Dyspnea, unspecified: Secondary | ICD-10-CM

## 2017-05-26 DIAGNOSIS — J449 Chronic obstructive pulmonary disease, unspecified: Secondary | ICD-10-CM

## 2017-05-26 DIAGNOSIS — R635 Abnormal weight gain: Secondary | ICD-10-CM

## 2017-05-26 DIAGNOSIS — R600 Localized edema: Secondary | ICD-10-CM

## 2017-05-26 DIAGNOSIS — E876 Hypokalemia: Secondary | ICD-10-CM

## 2017-05-26 DIAGNOSIS — K7460 Unspecified cirrhosis of liver: Secondary | ICD-10-CM

## 2017-05-26 NOTE — Progress Notes (Signed)
Subjective:    [name], female    DOB: 10/22/1940, 75 y.o.   MRN: [id number]  HPI  Pt in for follow up from the ED. cxr the other day showed copd but no chf. She went to ED for swelling of legs with some diffuse swelling all over body per pt.  Labs showed decrease gfr, mild ast elevation and sugar elevation. Her inr was stable. AFP marker was 3.0.  CBC was 12.0 and platelets were 98.  The ED clinical course reads  "Patient presents with increased bilateral leg swelling over the past four days. Of note she is on lasix and spironolactone for fluid overload for her chronic cirrhosis. Do not suspect DVT given no calf or thigh tenderness and bilateral symmetric swelling. Abdomen non-distended without fluid wave. Suspect this her symptoms are related to the fact that she stopped wearing her compression stockings over the past several days.   Basic labs reveal mild leukocytosis (WBC 12.0). Do not suspect infectious process given exam and vitals. Her platelet count is low (68), although this appears to be chronic. Have informed patient and counseled her to follow up on this with her GI doctor. BMP unremarkable, patient has good kidney function (Cr 100). Her glucose is elevated, have counseled her to have this managed at her primary care office and patient agrees.   Given patient's kidney function good and no electrolyte deficiencies, will increase patient's lasix dose for the next two days until she is seen by her gastroenterologist in two days for further management. Have also counseled her to resume wearing her compression stockings. Discussed return precautions. Patient agrees and voices understanding to the plan. Discussed this patient with Dr. Wilson Singer who also agrees with plan to discharge."    She states does feel little better today. Pt weight is 167 and on Oct 29th was 172 lb.  ED note state weight other day was 159 lb?  Pt has hx of nash, tobacco abuse, low platelets,   respiratory failure, copd,  hepatic encephalopathy, cirrhosis, diabetes and recent low potassium.  She is in for follow up. Still has swelling of legs. No calf cramping and she states her breathing is stable. No leg cramps. She wants her k checked.     Review of Systems  Constitutional: Positive for fatigue. Negative for chills and fever.       Chronic  HENT: Negative for congestion, ear pain, postnasal drip and sinus pain.   Respiratory: Positive for shortness of breath. Negative for cough, chest tightness and wheezing.        Chronic and related copd.   Cardiovascular: Negative for chest pain and palpitations.  Gastrointestinal: Negative for abdominal pain, blood in stool, constipation, diarrhea and vomiting.  Genitourinary: Negative for dysuria, flank pain and frequency.  Musculoskeletal: Negative for back pain, joint swelling and neck pain.       Pedal edema.  Skin: Negative for rash.  Neurological: Negative for dizziness, weakness and headaches.  Hematological: Negative for adenopathy. Does not bruise/bleed easily.  Psychiatric/Behavioral: Negative for behavioral problems, confusion, dysphoric mood and suicidal ideas. The patient is not nervous/anxious and is not hyperactive.    Past Medical History:  Diagnosis Date  . Abdominal aortic aneurysm ([place]) 10/01/2013   Fall of 2014 3.3 per patient, follows with Vascular surgeon.   . Acute respiratory failure with hypoxia ([place]) 07/31/2015  . Allergic state 11/10/2016  . Anxiety   . Anxiety and depression 02/01/2014  . Arthritis of both knees  10/01/2013  . Arthritis of right knee 10/01/2013   Follows with Dr Mayer Camel   . Benign paroxysmal positional vertigo 10/01/2013  . Breast cancer ([place])    No disease activity On Femara Follows with Dr Marin Olp Right mastectomy performed by Dr Autumn Messing   . Cancer [facility]) breast ca  right  . Chronic respiratory failure ([place]) 08/29/2015  . Cirrhosis of liver without ascites ([place]) 07/31/2015  . COPD (chronic  obstructive pulmonary disease) ([place]) 10/01/2013  . COPD with acute exacerbation ([place]) 05/05/2017  . Depression   . Dermatitis 03/30/2017  . Diabetes mellitus type 2  . Diabetes mellitus type 2, controlled ([place]) 10/16/2014  . Emphysema   . Encephalopathy, hepatic ([place]) 06/07/2014  . Esophageal reflux 10/01/2013  . Fall 07/30/2016  . Hyperlipidemia   . Hyperlipidemia, mixed   . Increased ammonia level 11/25/2014  . NASH (nonalcoholic steatohepatitis) 08/26/2015  . Neck pain 10/01/2013  . Neuropathy    feet   . Osteopenia 03/30/2017  . Overactive bladder 12/10/2013  . Panic attacks   . Pedal edema 12/10/2013  . Personal history of radiation therapy   . Preventative health care 03/08/2016  . Thrombocytopenia ([place]) 03/17/2012  . Tobacco abuse disorder 02/01/2014  . Type 2 diabetes mellitus with hyperglycemia, with long-term current use of insulin ([place])   . Urine frequency 04/13/2017     Social History   Social History  . Marital status: Single    Spouse name: N/A  . Number of children: 0  . Years of education: N/A   Occupational History  . retired Retired   Social History Main Topics  . Smoking status: Current Some Day Smoker    Packs/day: 0.25    Years: 58.00    Types: Cigarettes    Start date: 07/27/1964  . Smokeless tobacco: Never Used  . Alcohol use No  . Drug use: No  . Sexual activity: Not on file   Other Topics Concern  . Not on file   Social History Narrative   Lives alone, continues to smoke, no dietary restrictions    Past Surgical History:  Procedure Laterality Date  . APPENDECTOMY  2007  . BREAST SURGERY  2009 right  . CATARACT EXTRACTION     x 2  . ESOPHAGOGASTRODUODENOSCOPY (EGD) WITH PROPOFOL N/A 08/14/2016   Procedure: ESOPHAGOGASTRODUODENOSCOPY (EGD) WITH PROPOFOL;  Surgeon: Mauri Pole, MD;  Location: [facility];  Service: Endoscopy;  Laterality: N/A;  . [place]  . KNEE SURGERY    . MANDIBLE FRACTURE SURGERY    . MASTECTOMY    .  PILONIDAL CYST EXCISION    . TONSILLECTOMY      Family History  Problem Relation Age of Onset  . Heart failure Father   . COPD Father   . Arthritis Father 9  . Stroke Mother   . Arthritis Mother 74  . Hyperlipidemia Mother   . Hypertension Mother   . Diabetes Mother   . Diabetes Sister   . Breast cancer Unknown   . Breast cancer Maternal Aunt   . Asthma Maternal Aunt   . Birth defects Maternal Aunt   . Alcohol abuse Maternal Uncle   . Breast cancer Maternal Aunt     Allergies  Allergen Reactions  . Citalopram Palpitations    Irregular heart beat  . Ciprofloxacin     Mental status change  . Erythromycin Other (See Comments)    Stomach cramps  . Glimepiride     Elevated ammonia levels  .  Prednisone     Increased blood sugars too high    Current Outpatient Prescriptions on File Prior to Visit  Medication Sig Dispense Refill  . albuterol (ACCUNEB) 1.25 MG/3ML nebulizer solution as directed.    . budesonide (PULMICORT) 0.25 MG/2ML nebulizer solution as directed.    . Cholecalciferol (VITAMIN D3) 2000 units TABS Take 2,000 Units by mouth every morning.    . furosemide (LASIX) 40 MG tablet Take 1 tablet (40 mg total) by mouth daily. 30 tablet 3  . guaiFENesin (MUCINEX) 600 MG 12 hr tablet Take 2 tablets (1,200 mg total) by mouth 2 (two) times daily as needed for to loosen phlegm.    . insulin aspart (NOVOLOG FLEXPEN) 100 UNIT/ML FlexPen Inject into the skin. Sliding scale 121-150 - 2 units; 151-200 - 3 units; 201-250 -5 units; 251-300 - 8 units; 301-350 - 11units; 351-400 - 15 unitsc351-400 -15 units; greater than 400 -15 units and call MD.    . lactulose (CHRONULAC) 10 GM/15ML solution TAKE 45ML BY MOUTH TWICE DAILY AS NEEDED FOR MILD CONSTIPATION 1892 mL 0  . LEVEMIR FLEXTOUCH 100 UNIT/ML Pen INJECT 14 UNITS UNDER THE SKIN EVERY DAY AT 10PM 15 mL 0  . mometasone (NASONEX) 50 MCG/ACT nasal spray Place 2 sprays into the nose daily. 17 g 12  . omeprazole (PRILOSEC) 20 MG  capsule TAKE 1 CAPSULE(20 MG) BY MOUTH DAILY 90 capsule 0  . polyvinyl alcohol (LUBRICANT DROPS) 1.4 % ophthalmic solution Place 1 drop into both eyes as needed (dry eyes). 15 mL 0  . predniSONE (DELTASONE) 10 MG tablet Take 10 mg by mouth daily with breakfast. 2 po daily for 3 days then 1 po daily for 3 days then stop    . rifaximin (XIFAXAN) 550 MG TABS tablet Take 1 tablet (550 mg total) by mouth 2 (two) times daily. 60 tablet 11  . spironolactone (ALDACTONE) 100 MG tablet Take 1 tablet (100 mg total) by mouth daily. 30 tablet 3  . Tiotropium Bromide-Olodaterol (STIOLTO RESPIMAT) 2.5-2.5 MCG/ACT AERS Inhale 2 puffs into the lungs daily. 3 Inhaler 3   No current facility-administered medications on file prior to visit.     Pulse 78   Temp (!) 97.4 F (36.3 C) (Oral)   Resp 16   Ht 5\' 6"  (1.676 m)   Wt 167 lb 9.6 oz (76 kg)   LMP  (LMP Unknown)   SpO2 97%   BMI 27.05 kg/m      Objective:   Physical Exam  General  Mental Status - Alert. General Appearance - Well groomed. Not in acute distress.  Skin Rashes- No Rashes.  HEENT Head- Normal. Ear Auditory Canal - Left- Normal. Right - Normal.Tympanic Membrane- Left- Normal. Right- Normal. Eye Sclera/Conjunctiva- Left- Normal. Right- Normal. Nose & Sinuses Nasal Mucosa- Left-  Boggy and Congested. Right-  Boggy and  Congested.Bilateral  No maxillary and  No  frontal sinus pressure. Mouth & Throat Lips: Upper Lip- Normal: no dryness, cracking, pallor, cyanosis, or vesicular eruption. Lower Lip-Normal: no dryness, cracking, pallor, cyanosis or vesicular eruption. Buccal Mucosa- Bilateral- No Aphthous ulcers. Oropharynx- No Discharge or Erythema. Tonsils: Characteristics- Bilateral- No Erythema or Congestion. Size/Enlargement- Bilateral- No enlargement. Discharge- bilateral-None.  Neck Neck- Supple. No Masses.   Chest and Lung Exam Auscultation: Breath Sounds:-Clear even and unlabored.  Cardiovascular Auscultation:Rythm-  Regular, rate and rhythm. Murmurs & Other Heart Sounds:Ausculatation of the heart reveal- No Murmurs.  Lymphatic Head & Neck General Head & Neck Lymphatics: Bilateral: Description- No Localized lymphadenopathy.  Lower ext- 1-2 +pedal edema. Negative homans signs.   Upper ext- no upper ext edema appreciated.       Assessment & Plan:  For your recent moderate pedal edema, weight gain and ED evaluations, I am going to get metabolic panel, BNP and chest x-ray today.  For your COPD, would recommend that she do use the Pulmicort Nebules, Respimat inhaler and albuterol if needed.  Will make sure your potassium is still in normal limits.  Continue your diuretics.  Follow-up with your specialist for the paracentesis.  It appears that they did the alpha-fetoprotein tumor study the other day and INR.  Overall you look stable today and we will follow your results and let you know if there are any abnormalities.  Follow-up with your regular PCP in 1 month or as needed before then.  If PCP not available I would be happy to see you.  Rasheda Ledger, Percell Miller, PA-C

## 2017-05-26 NOTE — Patient Instructions (Addendum)
For your recent moderate pedal edema, weight gain and ED evaluations, I am going to get metabolic panel, BNP and chest x-ray today.  For your COPD, would recommend that she do use the Pulmicort Nebules, Respimat inhaler and albuterol if needed.  Will make sure your potassium is still in normal limits.  Continue your diuretics.  Follow-up with your specialist for the paracentesis.  It appears that they did the alpha-fetoprotein tumor study the other day and INR.  Overall you look stable today and we will follow your results and let you know if there are any abnormalities.  Follow-up with your regular PCP in 1 month or as needed before then.  If PCP not available I would be happy to see you.

## 2017-05-27 ENCOUNTER — Ambulatory Visit (HOSPITAL_COMMUNITY)
Admission: RE | Admit: 2017-05-27 | Discharge: 2017-05-27 | Disposition: A | Discharge status: Home / Self Care | Payer: PPO | Source: Ambulatory Visit | Attending: Gastroenterology | Admitting: Gastroenterology

## 2017-05-27 ENCOUNTER — Encounter (HOSPITAL_COMMUNITY): Payer: Self-pay

## 2017-05-27 ENCOUNTER — Other Ambulatory Visit: Payer: Self-pay | Admitting: Gastroenterology

## 2017-05-27 DIAGNOSIS — R188 Other ascites: Secondary | ICD-10-CM

## 2017-05-27 DIAGNOSIS — K7469 Other cirrhosis of liver: Secondary | ICD-10-CM

## 2017-05-27 DIAGNOSIS — K7581 Nonalcoholic steatohepatitis (NASH): Secondary | ICD-10-CM

## 2017-05-27 DIAGNOSIS — K7460 Unspecified cirrhosis of liver: Secondary | ICD-10-CM | POA: Diagnosis not present

## 2017-05-27 LAB — CBC WITH DIFFERENTIAL/PLATELET
BASOS PCT: 3.1 % — AB (ref 0.0–3.0)
Basophils Absolute: 0.3 10*3/uL — ABNORMAL HIGH (ref 0.0–0.1)
EOS ABS: 0.4 10*3/uL (ref 0.0–0.7)
EOS PCT: 3.6 % (ref 0.0–5.0)
HEMATOCRIT: 40.3 % (ref 36.0–46.0)
HEMOGLOBIN: 13.3 g/dL (ref 12.0–15.0)
LYMPHS PCT: 5.9 % — AB (ref 12.0–46.0)
Lymphs Abs: 0.6 10*3/uL — ABNORMAL LOW (ref 0.7–4.0)
MCHC: 33 g/dL (ref 30.0–36.0)
MCV: 100.1 fl — AB (ref 78.0–100.0)
MONOS PCT: 16.5 % — AB (ref 3.0–12.0)
Monocytes Absolute: 1.6 10*3/uL — ABNORMAL HIGH (ref 0.1–1.0)
NEUTROS ABS: 7 10*3/uL (ref 1.4–7.7)
Neutrophils Relative %: 70.9 % (ref 43.0–77.0)
PLATELETS: 69 10*3/uL — AB (ref 150.0–400.0)
RBC: 4.03 Mil/uL (ref 3.87–5.11)
RDW: 17.5 % — AB (ref 11.5–15.5)
WBC: 9.9 10*3/uL (ref 4.0–10.5)

## 2017-05-27 LAB — BRAIN NATRIURETIC PEPTIDE: PRO B NATRI PEPTIDE: 231 pg/mL — AB (ref 0.0–100.0)

## 2017-05-27 LAB — COMPREHENSIVE METABOLIC PANEL
ALBUMIN: 2.6 g/dL — AB (ref 3.5–5.2)
ALT: 31 U/L (ref 0–35)
AST: 36 U/L (ref 0–37)
Alkaline Phosphatase: 100 U/L (ref 39–117)
BILIRUBIN TOTAL: 3.8 mg/dL — AB (ref 0.2–1.2)
BUN: 33 mg/dL — AB (ref 6–23)
CALCIUM: 9.3 mg/dL (ref 8.4–10.5)
CO2: 35 mEq/L — ABNORMAL HIGH (ref 19–32)
CREATININE: 1 mg/dL (ref 0.40–1.20)
Chloride: 101 mEq/L (ref 96–112)
GFR: 57.58 mL/min — ABNORMAL LOW (ref >60.00)
Glucose, Bld: 158 mg/dL — ABNORMAL HIGH (ref 70–99)
Potassium: 4.5 mEq/L (ref 3.5–5.1)
SODIUM: 141 meq/L (ref 135–145)
TOTAL PROTEIN: 5.5 g/dL — AB (ref 6.0–8.3)

## 2017-05-28 ENCOUNTER — Encounter: Payer: Self-pay | Admitting: Gastroenterology

## 2017-05-28 ENCOUNTER — Encounter: Payer: Self-pay | Admitting: Pulmonary Disease

## 2017-05-28 ENCOUNTER — Encounter: Payer: Self-pay | Admitting: Medical

## 2017-05-31 ENCOUNTER — Telehealth: Payer: Self-pay | Admitting: Family Medicine

## 2017-05-31 NOTE — Telephone Encounter (Signed)
Please arrange office visit with NP for hospital follow-up She can go back on home medications prior-according to our  records this should be Pulmicort and Stiolto

## 2017-05-31 NOTE — Telephone Encounter (Signed)
Marita [place] from [place] called and stts they are unable to local patient and would like Dr. Charlett Blake to be informed since she referral her.   Caren Hazy- Clinical Manager @[phone].

## 2017-06-01 ENCOUNTER — Telehealth: Payer: Self-pay | Admitting: Family Medicine

## 2017-06-01 NOTE — Telephone Encounter (Signed)
Called patient and left message for patient to call the office back

## 2017-06-01 NOTE — Telephone Encounter (Signed)
Please reach out and see if she needs an appointment

## 2017-06-01 NOTE — Telephone Encounter (Signed)
Patient is calling about information that was sent to [facility]. Patient stated that the date of the physical was not listed on paperwork that was sent over. Patient is requesting that date of physical (03/30/17) be completed on paperwork and faxed to [phone]. Patient stated this is the 3rd time that this request has been made. Please advise and contact patient when it has been completed and faxed.

## 2017-06-01 NOTE — Telephone Encounter (Signed)
Claudia Desanctis M   1:30 PM  Note    Relation to [facility] Call back number: [phone] (H)   Reason for call:  Patient checking on the status of message below, please fax to [phone], patient would like confirmation thru my chart when completed.     May 12, 2017  Mosie Lukes, MD  to Magdalene Molly, [place]   9:06 PM  Please rewrite her letter with a date on it and send to patient    3:21 PM  Damita Dunnings, [place] routed this conversation to Mosie Lukes, MD  May 05, 2017  [name] "Manuela Schwartz"  to Mosie Lukes, MD    2:22 PM  When I had the physical on 03/30/17 you gave me a note I could send [facility] saying the annual physical was done. The note did not give a date when the physical was done. Can you send a fax to them showing that the annual physical was done on 03/30/17 so I can claim my benefits. Their fax number is:[phone].  Thanks

## 2017-06-01 NOTE — Telephone Encounter (Signed)
Ashley Savage has been unable to locate the patient. Just an [place]

## 2017-06-01 NOTE — Telephone Encounter (Signed)
Letter printed and faxed to number provided.

## 2017-06-02 ENCOUNTER — Other Ambulatory Visit: Payer: Self-pay | Admitting: *Deleted

## 2017-06-02 NOTE — Patient Outreach (Addendum)
[facility] [facility]) Care Management  06/02/2017  Ashley Savage [birth year] [id number]  Transition of care contact  RN spoke with pt today concerning update transition of care. Pt reports she has had a primary visit and her medications were updated by her pulmonary provider. Pt reports she is taken all her medications as prescribed and recommended and attending all medical appointments with no delays or missed appointments. RN verified pt remains in the GREEN zone with no reported symptoms since RN last conversation with pt. RN able to review and discuss pt's plan of care and goals. RN will continue to offer community resources were needed and verified pt's understanding on how to continue to manager her care. RN continue to offer one-on-one home visit to further engage in pt's care however pt continue to decline but receptive to ongoing transition of care contacts. RN will continue to encourage pt to comply and adhere to the plan of care. RN will schedule the next home visit for next week according to pt's schedule.  [name], RN Care Management Coordinator [place] Office [phone]

## 2017-06-05 ENCOUNTER — Encounter: Payer: Self-pay | Admitting: Internal Medicine

## 2017-06-08 ENCOUNTER — Other Ambulatory Visit: Payer: Self-pay | Admitting: *Deleted

## 2017-06-08 NOTE — Patient Outreach (Signed)
[facility]) Care Management  06/08/2017  DANILA EDDIE 1941/05/03 [id number]  RN spoke with pt today and inquired on update on her ongoing management of care. Pt states she continue to take all her medications as prescribed and attend all medical appointments with no missed appointments. Denies any home O2 and reports her breathing remains good with no distress. After reiterating on the COPD zones pt confirmed she remains in the GREEN zone with no flare ups on her COPD. Pt states she can easily recover with her breathing is she gets SOB with her activities. Reports she is currently on Ventolin but will need to change to ProAir based upon the new guidelines with [facility] coverage (provider aware).  Reported her recent ED visit due to leg swelling is much reduces and she continue to see her providers. No major issues at this time as RN continues to offer one-on-one home visit if needed for her ongoing needs.  Plan of care discussed and updated accordingly with noted changes. Will continue to complete ongoing transition of care calls and follow up next week according to pt's scheduling.   [name], RN Care Management Coordinator [place] Office [phone]

## 2017-06-14 ENCOUNTER — Other Ambulatory Visit: Payer: Self-pay | Admitting: *Deleted

## 2017-06-14 NOTE — Patient Outreach (Signed)
[facility]) Care Management  06/14/2017  KANYIA HEASLIP 1941/02/12 [id number]    Transition of care contact  RN attempted outreach call to pt today however only able to leave a HIPAA approved voice message requesting a call back. Will rescheduled ongoing contacts accordingly.  [name], RN Care Management Coordinator [place] Office [phone]

## 2017-06-16 ENCOUNTER — Other Ambulatory Visit: Payer: Self-pay | Admitting: Family Medicine

## 2017-06-23 ENCOUNTER — Other Ambulatory Visit: Payer: Self-pay | Admitting: *Deleted

## 2017-06-23 NOTE — Patient Outreach (Signed)
[facility]) Care Management  06/23/2017  Ashley Savage 1940/10/08 [id number]    Transition of care  RN attempted outreach today however unsuccessful. RN able to leave a HIPAA approved voice message requesting a call back. Will further inquire on pt's ongoing progress and needs at that time. Will scheduled the next outreach and continue transition of care contacts accordingly.  [name], RN Care Management Coordinator [place] Office [phone]

## 2017-06-24 ENCOUNTER — Encounter: Payer: Self-pay | Admitting: Gastroenterology

## 2017-06-24 ENCOUNTER — Ambulatory Visit: Payer: PPO | Admitting: Gastroenterology

## 2017-06-24 ENCOUNTER — Other Ambulatory Visit: Payer: Self-pay | Admitting: Gastroenterology

## 2017-06-24 ENCOUNTER — Other Ambulatory Visit (INDEPENDENT_AMBULATORY_CARE_PROVIDER_SITE_OTHER): Payer: PPO

## 2017-06-24 ENCOUNTER — Telehealth: Payer: Self-pay

## 2017-06-24 VITALS — BP 110/42 | HR 93 | Ht 65.0 in | Wt 157.2 lb

## 2017-06-24 DIAGNOSIS — R05 Cough: Secondary | ICD-10-CM | POA: Diagnosis not present

## 2017-06-24 DIAGNOSIS — R0602 Shortness of breath: Secondary | ICD-10-CM | POA: Diagnosis not present

## 2017-06-24 DIAGNOSIS — R600 Localized edema: Secondary | ICD-10-CM | POA: Diagnosis not present

## 2017-06-24 DIAGNOSIS — K7290 Hepatic failure, unspecified without coma: Secondary | ICD-10-CM | POA: Diagnosis not present

## 2017-06-24 DIAGNOSIS — R0902 Hypoxemia: Secondary | ICD-10-CM | POA: Diagnosis not present

## 2017-06-24 DIAGNOSIS — K7469 Other cirrhosis of liver: Secondary | ICD-10-CM | POA: Diagnosis not present

## 2017-06-24 DIAGNOSIS — K7581 Nonalcoholic steatohepatitis (NASH): Secondary | ICD-10-CM

## 2017-06-24 DIAGNOSIS — K7682 Hepatic encephalopathy: Secondary | ICD-10-CM

## 2017-06-24 DIAGNOSIS — R059 Cough, unspecified: Secondary | ICD-10-CM

## 2017-06-24 LAB — COMPREHENSIVE METABOLIC PANEL
ALK PHOS: 137 U/L — AB (ref 39–117)
ALT: 27 U/L (ref 0–35)
AST: 35 U/L (ref 0–37)
Albumin: 2.7 g/dL — ABNORMAL LOW (ref 3.5–5.2)
BUN: 19 mg/dL (ref 6–23)
CO2: 25 meq/L (ref 19–32)
Calcium: 9.3 mg/dL (ref 8.4–10.5)
Chloride: 103 mEq/L (ref 96–112)
Creatinine, Ser: 1 mg/dL (ref 0.40–1.20)
GFR: 57.57 mL/min — ABNORMAL LOW (ref >60.00)
GLUCOSE: 234 mg/dL — AB (ref 70–99)
POTASSIUM: 5.5 meq/L — AB (ref 3.5–5.1)
SODIUM: 135 meq/L (ref 135–145)
TOTAL PROTEIN: 5.9 g/dL — AB (ref 6.0–8.3)
Total Bilirubin: 4.9 mg/dL — ABNORMAL HIGH (ref 0.2–1.2)

## 2017-06-24 LAB — CBC WITH DIFFERENTIAL/PLATELET
Basophils Absolute: 0.2 10*3/uL — ABNORMAL HIGH (ref 0.0–0.1)
Basophils Relative: 0.8 % (ref 0.0–3.0)
EOS PCT: 0.2 % (ref 0.0–5.0)
Eosinophils Absolute: 0 10*3/uL (ref 0.0–0.7)
HCT: 42.4 % (ref 36.0–46.0)
Hemoglobin: 13.9 g/dL (ref 12.0–15.0)
LYMPHS ABS: 1.2 10*3/uL (ref 0.7–4.0)
Lymphocytes Relative: 6.1 % — ABNORMAL LOW (ref 12.0–46.0)
MCHC: 32.7 g/dL (ref 30.0–36.0)
MCV: 100.6 fl — ABNORMAL HIGH (ref 78.0–100.0)
MONO ABS: 2 10*3/uL — AB (ref 0.1–1.0)
MONOS PCT: 9.8 % (ref 3.0–12.0)
NEUTROS ABS: 16.7 10*3/uL — AB (ref 1.4–7.7)
NEUTROS PCT: 83.1 % — AB (ref 43.0–77.0)
PLATELETS: 141 10*3/uL — AB (ref 150.0–400.0)
RBC: 4.21 Mil/uL (ref 3.87–5.11)
RDW: 16.1 % — AB (ref 11.5–15.5)
WBC: 20.1 10*3/uL (ref 4.0–10.5)

## 2017-06-24 LAB — PROTIME-INR
INR: 1.4 ratio — AB (ref 0.8–1.0)
PROTHROMBIN TIME: 15.4 s — AB (ref 9.6–13.1)

## 2017-06-24 MED ORDER — SPIRONOLACTONE 100 MG PO TABS: 150.0000 mg | ORAL_TABLET | Freq: Every day | ORAL | 3 refills | Status: DC

## 2017-06-24 MED ORDER — FUROSEMIDE 40 MG PO TABS: 60.0000 mg | ORAL_TABLET | Freq: Every day | ORAL | 3 refills | Status: DC

## 2017-06-24 MED ORDER — LACTULOSE 10 GM/15ML PO SOLN: ORAL | 2 refills | Status: DC

## 2017-06-24 NOTE — Patient Instructions (Signed)
Go to the basement today for labs today  Follow up BMET in 1 week at [facility]  Increase Lasix to 60 mg and Aldactone 150 mg daily   We will refill lactulose and xifaxian today

## 2017-06-24 NOTE — Telephone Encounter (Signed)
Elevated WBC. Called to the patient and left a message to call back asap about her labs. Per Dr Silverio Decamp she is to go to the ED.

## 2017-06-24 NOTE — Telephone Encounter (Signed)
Left a message to call back.

## 2017-06-24 NOTE — Progress Notes (Signed)
Ashley Savage    [id number]    1941/03/16  Primary Care Physician:Blyth, Bonnita Levan, MD  Referring Physician: Mosie Lukes, MD [address] [place], [place] [address]  Chief complaint:  Cirrhosis, confusion, cough  HPI:  76 year old female with history of COPD, diabetes, [name], cirrhosis decompensated by hepatic encephalopathy and volume overload here for follow-up visit.  Patient was recently hospitalized in October with COPD exacerbation and she was discharged to short-term rehab.  Patient said her breathing improved in the rehab but it is worse again in the past few weeks, she is coughing thick mucus which looks like pus per patient.  She is also getting more short of breath, thinks it is due to the cold weather.  No fever or chills.  She had an episode of urinary incontinence last night in bed and feels she is getting more confused.  She is taking rifaximin and lactulose.  Patient was tearful during this visit and worried about her health.  She currently lives alone, has friends and neighbors check on her.  She does not have any home health aide or visiting nurse.    Outpatient Encounter Medications as of 06/24/2017  Medication Sig  . albuterol (ACCUNEB) 1.25 MG/3ML nebulizer solution as directed.  . Cholecalciferol (VITAMIN D3) 2000 units TABS Take 2,000 Units by mouth every morning.  . furosemide (LASIX) 40 MG tablet Take 1.5 tablets (60 mg total) by mouth daily.  Marland Kitchen guaiFENesin (MUCINEX) 600 MG 12 hr tablet Take 2 tablets (1,200 mg total) by mouth 2 (two) times daily as needed for to loosen phlegm.  . insulin aspart (NOVOLOG FLEXPEN) 100 UNIT/ML FlexPen Inject into the skin. Sliding scale 121-150 - 2 units; 151-200 - 3 units; 201-250 -5 units; 251-300 - 8 units; 301-350 - 11units; 351-400 - 15 unitsc351-400 -15 units; greater than 400 -15 units and call MD.  . lactulose (CHRONULAC) 10 GM/15ML solution TAKE 45ML BY MOUTH TWICE DAILY AS NEEDED FOR MILD  CONSTIPATION  . LEVEMIR FLEXTOUCH 100 UNIT/ML Pen INJECT 14 UNITS UNDER THE SKIN EVERY DAY AT 10PM  . mometasone (NASONEX) 50 MCG/ACT nasal spray Place 2 sprays into the nose daily.  Marland Kitchen omeprazole (PRILOSEC) 20 MG capsule TAKE 1 CAPSULE(20 MG) BY MOUTH DAILY  . polyvinyl alcohol (LUBRICANT DROPS) 1.4 % ophthalmic solution Place 1 drop into both eyes as needed (dry eyes).  . rifaximin (XIFAXAN) 550 MG TABS tablet Take 1 tablet (550 mg total) by mouth 2 (two) times daily.  Marland Kitchen spironolactone (ALDACTONE) 100 MG tablet Take 1.5 tablets (150 mg total) by mouth daily.  . Tiotropium Bromide-Olodaterol (STIOLTO RESPIMAT) 2.5-2.5 MCG/ACT AERS Inhale 2 puffs into the lungs daily.  . [DISCONTINUED] budesonide (PULMICORT) 0.25 MG/2ML nebulizer solution as directed.  . [DISCONTINUED] furosemide (LASIX) 40 MG tablet Take 1 tablet (40 mg total) by mouth daily.  . [DISCONTINUED] lactulose (CHRONULAC) 10 GM/15ML solution TAKE 45ML BY MOUTH TWICE DAILY AS NEEDED FOR MILD CONSTIPATION  . [DISCONTINUED] spironolactone (ALDACTONE) 100 MG tablet Take 1 tablet (100 mg total) by mouth daily.  . [DISCONTINUED] predniSONE (DELTASONE) 10 MG tablet Take 10 mg by mouth daily with breakfast. 2 po daily for 3 days then 1 po daily for 3 days then stop   No facility-administered encounter medications on file as of 06/24/2017.     Allergies as of 06/24/2017 - Review Complete 06/24/2017  Allergen Reaction Noted  . Citalopram Palpitations 04/22/2012  . Ciprofloxacin  03/30/2017  . Erythromycin Other (See Comments) 02/26/2011  . Glimepiride  02/03/2015  . Prednisone  02/27/2016    Past Medical History:  Diagnosis Date  . Abdominal aortic aneurysm ([place]) 10/01/2013   Fall of 2014 3.3 per patient, follows with Vascular surgeon.   . Acute respiratory failure with hypoxia ([place]) 07/31/2015  . Allergic state 11/10/2016  . Anxiety   . Anxiety and depression 02/01/2014  . Arthritis of both knees 10/01/2013  . Arthritis of right knee  10/01/2013   Follows with Dr Mayer Camel   . Benign paroxysmal positional vertigo 10/01/2013  . Breast cancer ([place])    No disease activity On Femara Follows with Dr Marin Olp Right mastectomy performed by Dr Autumn Messing   . Cancer [facility]) breast ca  right  . Chronic respiratory failure ([place]) 08/29/2015  . Cirrhosis of liver without ascites ([place]) 07/31/2015  . COPD (chronic obstructive pulmonary disease) ([place]) 10/01/2013  . COPD with acute exacerbation ([place]) 05/05/2017  . Depression   . Dermatitis 03/30/2017  . Diabetes mellitus type 2  . Diabetes mellitus type 2, controlled ([place]) 10/16/2014  . Emphysema   . Encephalopathy, hepatic ([place]) 06/07/2014  . Esophageal reflux 10/01/2013  . Fall 07/30/2016  . Hyperlipidemia   . Hyperlipidemia, mixed   . Increased ammonia level 11/25/2014  . NASH (nonalcoholic steatohepatitis) 08/26/2015  . Neck pain 10/01/2013  . Neuropathy    feet   . Osteopenia 03/30/2017  . Overactive bladder 12/10/2013  . Panic attacks   . Pedal edema 12/10/2013  . Personal history of radiation therapy   . Preventative health care 03/08/2016  . Thrombocytopenia ([place]) 03/17/2012  . Tobacco abuse disorder 02/01/2014  . Type 2 diabetes mellitus with hyperglycemia, with long-term current use of insulin ([place])   . Urine frequency 04/13/2017    Past Surgical History:  Procedure Laterality Date  . APPENDECTOMY  2007  . BREAST SURGERY  2009 right  . CATARACT EXTRACTION     x 2  . ESOPHAGOGASTRODUODENOSCOPY (EGD) WITH PROPOFOL N/A 08/14/2016   Procedure: ESOPHAGOGASTRODUODENOSCOPY (EGD) WITH PROPOFOL;  Surgeon: Mauri Pole, MD;  Location: [facility];  Service: Endoscopy;  Laterality: N/A;  . [place]  . KNEE SURGERY    . MANDIBLE FRACTURE SURGERY    . MASTECTOMY    . PILONIDAL CYST EXCISION    . TONSILLECTOMY      Family History  Problem Relation Age of Onset  . Heart failure Father   . COPD Father   . Arthritis Father 32  . Stroke Mother   . Arthritis Mother 91  .  Hyperlipidemia Mother   . Hypertension Mother   . Diabetes Mother   . Diabetes Sister   . Breast cancer Unknown   . Breast cancer Maternal Aunt   . Asthma Maternal Aunt   . Birth defects Maternal Aunt   . Alcohol abuse Maternal Uncle   . Breast cancer Maternal Aunt   . Stomach cancer Neg Hx   . Colon cancer Neg Hx     Social History   Socioeconomic History  . Marital status: Single    Spouse name: Not on file  . Number of children: 0  . Years of education: Not on file  . Highest education level: Not on file  Social Needs  . Financial resource strain: Not on file  . Food insecurity - worry: Not on file  . Food insecurity - inability: Not on file  . Transportation needs - medical: Not on file  .  Transportation needs - non-medical: Not on file  Occupational History  . Occupation: retired    Fish farm manager: RETIRED  Tobacco Use  . Smoking status: Current Some Day Smoker    Packs/day: 0.25    Years: 58.00    Pack years: 14.50    Types: Cigarettes    Start date: 07/27/1964  . Smokeless tobacco: Never Used  Substance and Sexual Activity  . Alcohol use: No    Alcohol/week: 0.0 oz  . Drug use: No  . Sexual activity: No    Birth control/protection: Post-menopausal  Other Topics Concern  . Not on file  Social History Narrative   Lives alone, continues to smoke, no dietary restrictions      Review of systems: Review of Systems  Constitutional: Negative for fever and chills.  HENT: Negative.   Eyes: Negative for blurred vision.  Respiratory: Positive for cough, shortness of breath and wheezing.   Cardiovascular: Negative for chest pain and palpitations.  Gastrointestinal: as per HPI Genitourinary: Negative for dysuria, urgency, frequency and hematuria.  Musculoskeletal: Negative for myalgias, back pain and joint pain.  Skin: Negative for itching and rash.  Neurological: Negative for dizziness, tremors, focal weakness, seizures and loss of consciousness.    Endo/Heme/Allergies: Positive for seasonal allergies.  Psychiatric/Behavioral: Negative for depression, suicidal ideas and hallucinations.  All other systems reviewed and are negative.   Physical Exam: Vitals:   06/24/17 1020  BP: (!) 110/42  Pulse: 93  SpO2: (!) 87%   Body mass index is 26.16 kg/m. Gen:      No acute distress HEENT:  EOMI, sclera anicteric Neck:     No masses; no thyromegaly Lungs:    Tachypneic, coughing, decreased breath sounds bilateral base CV:         Regular rate and rhythm; no murmurs Abd:      + bowel sounds; soft, non-tender; no palpable masses, no distension Ext:    3+edema; adequate peripheral perfusion Skin:      Warm and dry; no rash Neuro: alert and oriented x 3, no asterixis Psych: normal mood and affect  Data Reviewed:  Reviewed labs, radiology imaging, old records and pertinent past GI work up  Echocardiogram May 08, 2017 showed normal LV function with EF 65-70% with, pseudo-normal left ventricular filling pattern with abnormal relaxation and increased filling pressure grade 2 diastolic dysfunction.  Pulmonary artery peak pressure 47 mmHg.  Right ventricular systolic pressure increased consistent with moderate pulmonary hypertension.   Assessment and Plan/Recommendations:  76 year old with history of diabetes, [name] cirrhosis decompensated by hepatic encephalopathy volume overload, severe COPD recently hospitalized with acute exacerbation here shortness of breath, cough with mucus, O2 sat 87% on room air.  Patient was discharged on home oxygen currently she is not on any oxygen.  She has not followed with pulmonary, states that she cannot afford the medications or the testing that was recommended by her pulmonologist. I encouraged patient to schedule an urgent visit to follow-up with PMD and pulmonary I also advised her to discuss with social services/PMD if she will qualify for home health aide/visiting nurse, patient refused and thinks she  has enough support from her neighbors and friends and does not want to look into it at this point. Check CBC,CMP, PT/ INR Volume overload with significant peripheral edema: Increase Lasix to 60 mg daily and Aldactone to 150 mg daily Recheck BMP in 1 week after dose increase Up-to-date for [facility] screening Small esophageal varices on EGD January 2018 Hepatic encephalopathy: stable.  Continue  rifaximin twice daily and lactulose with goal of 2-3 bowel movements daily    K. Denzil Magnuson , MD [phone] Mon-Fri 8a-5p [phone] after 5p, weekends, holidays  CC: Mosie Lukes, MD

## 2017-06-25 ENCOUNTER — Emergency Department (HOSPITAL_BASED_OUTPATIENT_CLINIC_OR_DEPARTMENT_OTHER): Payer: PPO

## 2017-06-25 ENCOUNTER — Inpatient Hospital Stay (HOSPITAL_BASED_OUTPATIENT_CLINIC_OR_DEPARTMENT_OTHER)
Admission: EM | Admit: 2017-06-25 | Discharge: 2017-06-30 | DRG: 871 | Disposition: A | Discharge status: Home Health | Payer: PPO | Attending: Family Medicine | Admitting: Family Medicine

## 2017-06-25 ENCOUNTER — Other Ambulatory Visit: Payer: Self-pay

## 2017-06-25 ENCOUNTER — Encounter (HOSPITAL_BASED_OUTPATIENT_CLINIC_OR_DEPARTMENT_OTHER): Payer: Self-pay | Admitting: *Deleted

## 2017-06-25 DIAGNOSIS — J988 Other specified respiratory disorders: Secondary | ICD-10-CM

## 2017-06-25 DIAGNOSIS — J440 Chronic obstructive pulmonary disease with acute lower respiratory infection: Secondary | ICD-10-CM | POA: Diagnosis not present

## 2017-06-25 DIAGNOSIS — A419 Sepsis, unspecified organism: Secondary | ICD-10-CM | POA: Diagnosis not present

## 2017-06-25 DIAGNOSIS — J9610 Chronic respiratory failure, unspecified whether with hypoxia or hypercapnia: Secondary | ICD-10-CM | POA: Diagnosis not present

## 2017-06-25 DIAGNOSIS — Z8349 Family history of other endocrine, nutritional and metabolic diseases: Secondary | ICD-10-CM | POA: Diagnosis not present

## 2017-06-25 DIAGNOSIS — K219 Gastro-esophageal reflux disease without esophagitis: Secondary | ICD-10-CM | POA: Diagnosis present

## 2017-06-25 DIAGNOSIS — R7989 Other specified abnormal findings of blood chemistry: Secondary | ICD-10-CM | POA: Diagnosis not present

## 2017-06-25 DIAGNOSIS — E875 Hyperkalemia: Secondary | ICD-10-CM | POA: Diagnosis not present

## 2017-06-25 DIAGNOSIS — Z833 Family history of diabetes mellitus: Secondary | ICD-10-CM

## 2017-06-25 DIAGNOSIS — E872 Acidosis: Secondary | ICD-10-CM | POA: Diagnosis not present

## 2017-06-25 DIAGNOSIS — Z9842 Cataract extraction status, left eye: Secondary | ICD-10-CM | POA: Diagnosis not present

## 2017-06-25 DIAGNOSIS — Z825 Family history of asthma and other chronic lower respiratory diseases: Secondary | ICD-10-CM

## 2017-06-25 DIAGNOSIS — M8580 Other specified disorders of bone density and structure, unspecified site: Secondary | ICD-10-CM | POA: Diagnosis not present

## 2017-06-25 DIAGNOSIS — Z794 Long term (current) use of insulin: Secondary | ICD-10-CM | POA: Diagnosis not present

## 2017-06-25 DIAGNOSIS — Z8249 Family history of ischemic heart disease and other diseases of the circulatory system: Secondary | ICD-10-CM

## 2017-06-25 DIAGNOSIS — Z803 Family history of malignant neoplasm of breast: Secondary | ICD-10-CM | POA: Diagnosis not present

## 2017-06-25 DIAGNOSIS — H1030 Unspecified acute conjunctivitis, unspecified eye: Secondary | ICD-10-CM | POA: Diagnosis present

## 2017-06-25 DIAGNOSIS — R0602 Shortness of breath: Secondary | ICD-10-CM | POA: Diagnosis not present

## 2017-06-25 DIAGNOSIS — Z823 Family history of stroke: Secondary | ICD-10-CM | POA: Diagnosis not present

## 2017-06-25 DIAGNOSIS — Z881 Allergy status to other antibiotic agents status: Secondary | ICD-10-CM

## 2017-06-25 DIAGNOSIS — R0902 Hypoxemia: Secondary | ICD-10-CM | POA: Diagnosis not present

## 2017-06-25 DIAGNOSIS — J189 Pneumonia, unspecified organism: Secondary | ICD-10-CM | POA: Diagnosis not present

## 2017-06-25 DIAGNOSIS — Z9841 Cataract extraction status, right eye: Secondary | ICD-10-CM | POA: Diagnosis not present

## 2017-06-25 DIAGNOSIS — K7460 Unspecified cirrhosis of liver: Secondary | ICD-10-CM | POA: Diagnosis not present

## 2017-06-25 DIAGNOSIS — H10023 Other mucopurulent conjunctivitis, bilateral: Secondary | ICD-10-CM | POA: Diagnosis not present

## 2017-06-25 DIAGNOSIS — J441 Chronic obstructive pulmonary disease with (acute) exacerbation: Secondary | ICD-10-CM | POA: Diagnosis not present

## 2017-06-25 DIAGNOSIS — Z9010 Acquired absence of unspecified breast and nipple: Secondary | ICD-10-CM

## 2017-06-25 DIAGNOSIS — K7581 Nonalcoholic steatohepatitis (NASH): Secondary | ICD-10-CM | POA: Diagnosis present

## 2017-06-25 DIAGNOSIS — E782 Mixed hyperlipidemia: Secondary | ICD-10-CM | POA: Diagnosis not present

## 2017-06-25 DIAGNOSIS — E1140 Type 2 diabetes mellitus with diabetic neuropathy, unspecified: Secondary | ICD-10-CM | POA: Diagnosis not present

## 2017-06-25 DIAGNOSIS — E119 Type 2 diabetes mellitus without complications: Secondary | ICD-10-CM | POA: Diagnosis not present

## 2017-06-25 DIAGNOSIS — Z8261 Family history of arthritis: Secondary | ICD-10-CM | POA: Diagnosis not present

## 2017-06-25 DIAGNOSIS — R740 Nonspecific elevation of levels of transaminase and lactic acid dehydrogenase [LDH]: Secondary | ICD-10-CM | POA: Diagnosis not present

## 2017-06-25 DIAGNOSIS — F17210 Nicotine dependence, cigarettes, uncomplicated: Secondary | ICD-10-CM | POA: Diagnosis not present

## 2017-06-25 DIAGNOSIS — Z888 Allergy status to other drugs, medicaments and biological substances status: Secondary | ICD-10-CM

## 2017-06-25 DIAGNOSIS — J069 Acute upper respiratory infection, unspecified: Secondary | ICD-10-CM | POA: Diagnosis not present

## 2017-06-25 DIAGNOSIS — Z66 Do not resuscitate: Secondary | ICD-10-CM | POA: Diagnosis present

## 2017-06-25 DIAGNOSIS — Z853 Personal history of malignant neoplasm of breast: Secondary | ICD-10-CM

## 2017-06-25 DIAGNOSIS — R05 Cough: Secondary | ICD-10-CM | POA: Diagnosis not present

## 2017-06-25 LAB — CBC WITH DIFFERENTIAL/PLATELET
Basophils Absolute: 0 10*3/uL (ref 0.0–0.1)
Basophils Relative: 0 %
EOS PCT: 0 %
Eosinophils Absolute: 0 10*3/uL (ref 0.0–0.7)
HEMATOCRIT: 37.2 % (ref 36.0–46.0)
Hemoglobin: 12.7 g/dL (ref 12.0–15.0)
LYMPHS ABS: 1.4 10*3/uL (ref 0.7–4.0)
Lymphocytes Relative: 7 %
MCH: 32.8 pg (ref 26.0–34.0)
MCHC: 34.1 g/dL (ref 30.0–36.0)
MCV: 96.1 fL (ref 78.0–100.0)
MONOS PCT: 10 %
Monocytes Absolute: 2 10*3/uL — ABNORMAL HIGH (ref 0.1–1.0)
NEUTROS ABS: 16.9 10*3/uL — AB (ref 1.7–7.7)
Neutrophils Relative %: 83 %
Platelets: 108 10*3/uL — ABNORMAL LOW (ref 150–400)
RBC: 3.87 MIL/uL (ref 3.87–5.11)
RDW: 15.4 % (ref 11.5–15.5)
WBC: 20.3 10*3/uL — ABNORMAL HIGH (ref 4.0–10.5)

## 2017-06-25 LAB — I-STAT CG4 LACTIC ACID, ED
LACTIC ACID, VENOUS: 3.07 mmol/L — AB (ref 0.5–1.9)
Lactic Acid, Venous: 3.75 mmol/L (ref 0.5–1.9)

## 2017-06-25 LAB — URINALYSIS, ROUTINE W REFLEX MICROSCOPIC
BILIRUBIN URINE: NEGATIVE
GLUCOSE, UA: NEGATIVE mg/dL
HGB URINE DIPSTICK: NEGATIVE
Ketones, ur: NEGATIVE mg/dL
Leukocytes, UA: NEGATIVE
Nitrite: NEGATIVE
PROTEIN: NEGATIVE mg/dL
pH: 5.5 (ref 5.0–8.0)

## 2017-06-25 LAB — INFLUENZA PANEL BY PCR (TYPE A & B)
INFLAPCR: NEGATIVE
INFLBPCR: NEGATIVE

## 2017-06-25 LAB — COMPREHENSIVE METABOLIC PANEL
ALT: 27 U/L (ref 14–54)
AST: 42 U/L — ABNORMAL HIGH (ref 15–41)
Albumin: 2.4 g/dL — ABNORMAL LOW (ref 3.5–5.0)
Alkaline Phosphatase: 126 U/L (ref 38–126)
Anion gap: 4 — ABNORMAL LOW (ref 5–15)
BUN: 26 mg/dL — ABNORMAL HIGH (ref 6–20)
CO2: 23 mmol/L (ref 22–32)
Calcium: 8.6 mg/dL — ABNORMAL LOW (ref 8.9–10.3)
Chloride: 102 mmol/L (ref 101–111)
Creatinine, Ser: 0.93 mg/dL (ref 0.44–1.00)
GFR calc Af Amer: >60 mL/min (ref >60)
GFR calc non Af Amer: 59 mL/min — ABNORMAL LOW (ref >60)
Glucose, Bld: 255 mg/dL — ABNORMAL HIGH (ref 65–99)
Potassium: 4.6 mmol/L (ref 3.5–5.1)
Sodium: 129 mmol/L — ABNORMAL LOW (ref 135–145)
Total Bilirubin: 4.4 mg/dL — ABNORMAL HIGH (ref 0.3–1.2)
Total Protein: 5.6 g/dL — ABNORMAL LOW (ref 6.5–8.1)

## 2017-06-25 LAB — MRSA PCR SCREENING: MRSA by PCR: NEGATIVE

## 2017-06-25 LAB — GLUCOSE, CAPILLARY
GLUCOSE-CAPILLARY: 201 mg/dL — AB (ref 65–99)
GLUCOSE-CAPILLARY: 203 mg/dL — AB (ref 65–99)

## 2017-06-25 MED ORDER — VANCOMYCIN HCL IN DEXTROSE 750-5 MG/150ML-% IV SOLN
750.0000 mg | Freq: Two times a day (BID) | INTRAVENOUS | Status: DC
  Filled 2017-06-25: qty 150

## 2017-06-25 MED ORDER — ALBUTEROL SULFATE (2.5 MG/3ML) 0.083% IN NEBU
5.0000 mg | INHALATION_SOLUTION | Freq: Once | RESPIRATORY_TRACT | Status: AC
  Administered 2017-06-25: 5 mg via RESPIRATORY_TRACT
  Filled 2017-06-25: qty 6

## 2017-06-25 MED ORDER — INSULIN DETEMIR 100 UNIT/ML ~~LOC~~ SOLN
14.0000 [IU] | Freq: Every day | SUBCUTANEOUS | Status: DC
  Administered 2017-06-25 – 2017-06-29 (×4): 14 [IU] via SUBCUTANEOUS
  Filled 2017-06-25 (×5): qty 0.14

## 2017-06-25 MED ORDER — DEXTROSE 5 % IV SOLN
1.0000 g | Freq: Three times a day (TID) | INTRAVENOUS | Status: DC
  Filled 2017-06-25: qty 1

## 2017-06-25 MED ORDER — SODIUM CHLORIDE 0.9 % IV BOLUS (SEPSIS)
500.0000 mL | Freq: Once | INTRAVENOUS | Status: AC
  Administered 2017-06-25: 500 mL via INTRAVENOUS

## 2017-06-25 MED ORDER — IPRATROPIUM BROMIDE 0.02 % IN SOLN
0.5000 mg | Freq: Once | RESPIRATORY_TRACT | Status: AC
  Administered 2017-06-25: 0.5 mg via RESPIRATORY_TRACT
  Filled 2017-06-25: qty 2.5

## 2017-06-25 MED ORDER — ONDANSETRON HCL 4 MG/2ML IJ SOLN: 4.0000 mg | Freq: Four times a day (QID) | INTRAMUSCULAR | Status: DC | PRN

## 2017-06-25 MED ORDER — CEFEPIME HCL 2 G IJ SOLR
INTRAMUSCULAR | Status: AC
  Filled 2017-06-25: qty 2

## 2017-06-25 MED ORDER — FUROSEMIDE 40 MG PO TABS
60.0000 mg | ORAL_TABLET | Freq: Every day | ORAL | Status: DC
  Administered 2017-06-26: 60 mg via ORAL
  Filled 2017-06-25: qty 1

## 2017-06-25 MED ORDER — INSULIN ASPART 100 UNIT/ML ~~LOC~~ SOLN
0.0000 [IU] | Freq: Three times a day (TID) | SUBCUTANEOUS | Status: DC
  Administered 2017-06-26: 2 [IU] via SUBCUTANEOUS
  Administered 2017-06-26: 3 [IU] via SUBCUTANEOUS
  Administered 2017-06-27: 5 [IU] via SUBCUTANEOUS
  Administered 2017-06-27: 2 [IU] via SUBCUTANEOUS
  Administered 2017-06-27: 5 [IU] via SUBCUTANEOUS
  Administered 2017-06-28: 9 [IU] via SUBCUTANEOUS
  Administered 2017-06-28: 3 [IU] via SUBCUTANEOUS
  Administered 2017-06-28: 9 [IU] via SUBCUTANEOUS
  Administered 2017-06-29 (×3): 7 [IU] via SUBCUTANEOUS
  Administered 2017-06-30: 12:00:00 3 [IU] via SUBCUTANEOUS
  Administered 2017-06-30: 08:00:00 5 [IU] via SUBCUTANEOUS

## 2017-06-25 MED ORDER — SPIRONOLACTONE 25 MG PO TABS
150.0000 mg | ORAL_TABLET | Freq: Every day | ORAL | Status: DC
  Administered 2017-06-25 – 2017-06-26 (×2): 150 mg via ORAL
  Filled 2017-06-25 (×2): qty 6

## 2017-06-25 MED ORDER — VANCOMYCIN HCL IN DEXTROSE 1-5 GM/200ML-% IV SOLN
1000.0000 mg | Freq: Once | INTRAVENOUS | Status: AC
  Administered 2017-06-25: 1000 mg via INTRAVENOUS
  Filled 2017-06-25: qty 200

## 2017-06-25 MED ORDER — LACTULOSE 10 GM/15ML PO SOLN
10.0000 g | Freq: Every day | ORAL | Status: DC
  Administered 2017-06-25 – 2017-06-26 (×2): 10 g via ORAL
  Filled 2017-06-25 (×3): qty 15

## 2017-06-25 MED ORDER — SODIUM CHLORIDE 0.9% FLUSH: 3.0000 mL | INTRAVENOUS | Status: DC | PRN

## 2017-06-25 MED ORDER — RIFAXIMIN 550 MG PO TABS
550.0000 mg | ORAL_TABLET | Freq: Two times a day (BID) | ORAL | Status: DC
  Administered 2017-06-25 – 2017-06-30 (×10): 550 mg via ORAL
  Filled 2017-06-25 (×10): qty 1

## 2017-06-25 MED ORDER — ONDANSETRON HCL 4 MG PO TABS: 4.0000 mg | ORAL_TABLET | Freq: Four times a day (QID) | ORAL | Status: DC | PRN

## 2017-06-25 MED ORDER — ALBUTEROL SULFATE (2.5 MG/3ML) 0.083% IN NEBU: 5.0000 mg | INHALATION_SOLUTION | RESPIRATORY_TRACT | Status: DC | PRN

## 2017-06-25 MED ORDER — SODIUM CHLORIDE 0.9% FLUSH
3.0000 mL | Freq: Two times a day (BID) | INTRAVENOUS | Status: DC
  Administered 2017-06-25 – 2017-06-30 (×9): 3 mL via INTRAVENOUS

## 2017-06-25 MED ORDER — DEXTROSE 5 % IV SOLN
2.0000 g | Freq: Once | INTRAVENOUS | Status: AC
  Administered 2017-06-25: 2 g via INTRAVENOUS

## 2017-06-25 MED ORDER — PANTOPRAZOLE SODIUM 40 MG PO TBEC
40.0000 mg | DELAYED_RELEASE_TABLET | Freq: Every day | ORAL | Status: DC
  Administered 2017-06-26 – 2017-06-30 (×5): 40 mg via ORAL
  Filled 2017-06-25 (×5): qty 1

## 2017-06-25 MED ORDER — ENOXAPARIN SODIUM 40 MG/0.4ML ~~LOC~~ SOLN
40.0000 mg | SUBCUTANEOUS | Status: DC
  Administered 2017-06-25 – 2017-06-29 (×5): 40 mg via SUBCUTANEOUS
  Filled 2017-06-25 (×5): qty 0.4

## 2017-06-25 MED ORDER — GUAIFENESIN ER 600 MG PO TB12: 1200.0000 mg | ORAL_TABLET | Freq: Two times a day (BID) | ORAL | Status: DC | PRN

## 2017-06-25 MED ORDER — SODIUM CHLORIDE 0.9 % IV SOLN: 250.0000 mL | INTRAVENOUS | Status: DC | PRN

## 2017-06-25 MED ORDER — TOBRAMYCIN 0.3 % OP SOLN
2.0000 [drp] | Freq: Once | OPHTHALMIC | Status: AC
  Administered 2017-06-25: 2 [drp] via OPHTHALMIC
  Filled 2017-06-25: qty 5

## 2017-06-25 NOTE — ED Triage Notes (Signed)
Drainage from her eyes since yesterday. She was seen by her MD yesterday for her routine appointment. She had an elevated WBC on her lab work. She was advised to come to the ED.

## 2017-06-25 NOTE — Telephone Encounter (Signed)
Patient was advised and has gone to the ER.

## 2017-06-25 NOTE — H&P (Signed)
TRH H&P    Patient Demographics:    Ashley Savage, is a 76 y.o. female  MRN: [id number]  DOB - July 19, 1941  Admit Date - 06/25/2017     Chief Complaint  Patient presents with  . Eye Problem  . Abnormal Lab      HPI:    Ashley Savage  is a 76 y.o. female,. with history of liver cirrhosis, NASH, diabetes mellitus, hyperlipidemia, COPD who came to ED for worsening shortness of breath, productive cough with greenish sputum. Patient says that symptoms have been going on for past few days. She went to her gastroneurologist yesterday who checked her labs and found that she had increased WBC. Patient denies chest pain. She denies nausea vomiting or diarrhea. Denies dysuria urgency or frequency of urination.  At [facility] chest x-ray was done which did not show pneumonia. She was found to have white count of 20,000, lactic acidosis with lactic acid of 3.75. Patient basically started on vancomycin and cefepime.    Review of systems:      All other systems reviewed and are negative.   With Past History of the following :    Past Medical History:  Diagnosis Date  . Abdominal aortic aneurysm ([place]) 10/01/2013   Fall of 2014 3.3 per patient, follows with Vascular surgeon.   . Acute respiratory failure with hypoxia ([place]) 07/31/2015  . Allergic state 11/10/2016  . Anxiety   . Anxiety and depression 02/01/2014  . Arthritis of both knees 10/01/2013  . Arthritis of right knee 10/01/2013   Follows with Dr Mayer Camel   . Benign paroxysmal positional vertigo 10/01/2013  . Breast cancer ([place])    No disease activity On Femara Follows with Dr Marin Olp Right mastectomy performed by Dr Autumn Messing   . Cancer [facility]) breast ca  right  . Chronic respiratory failure ([place]) 08/29/2015  . Cirrhosis of liver without ascites ([place]) 07/31/2015  . COPD (chronic obstructive pulmonary disease) ([place]) 10/01/2013  . COPD with acute exacerbation  ([place]) 05/05/2017  . Depression   . Dermatitis 03/30/2017  . Diabetes mellitus type 2  . Diabetes mellitus type 2, controlled ([place]) 10/16/2014  . Emphysema   . Encephalopathy, hepatic ([place]) 06/07/2014  . Esophageal reflux 10/01/2013  . Fall 07/30/2016  . Hyperlipidemia   . Hyperlipidemia, mixed   . Increased ammonia level 11/25/2014  . NASH (nonalcoholic steatohepatitis) 08/26/2015  . Neck pain 10/01/2013  . Neuropathy    feet   . Osteopenia 03/30/2017  . Overactive bladder 12/10/2013  . Panic attacks   . Pedal edema 12/10/2013  . Personal history of radiation therapy   . Preventative health care 03/08/2016  . Thrombocytopenia ([place]) 03/17/2012  . Tobacco abuse disorder 02/01/2014  . Type 2 diabetes mellitus with hyperglycemia, with long-term current use of insulin ([place])   . Urine frequency 04/13/2017      Past Surgical History:  Procedure Laterality Date  . APPENDECTOMY  2007  . BREAST SURGERY  2009 right  . CATARACT EXTRACTION     x 2  .  ESOPHAGOGASTRODUODENOSCOPY (EGD) WITH PROPOFOL N/A 08/14/2016   Procedure: ESOPHAGOGASTRODUODENOSCOPY (EGD) WITH PROPOFOL;  Surgeon: Mauri Pole, MD;  Location[facility];  Service: Endoscopy;  Laterality: N/A;  . [place]  . KNEE SURGERY    . MANDIBLE FRACTURE SURGERY    . MASTECTOMY    . PILONIDAL CYST EXCISION    . TONSILLECTOMY        Social History:      Social History   Tobacco Use  . Smoking status: Current Every Day Smoker    Packs/day: 0.25    Years: 58.00    Pack years: 14.50    Types: Cigarettes    Start date: 07/27/1964  . Smokeless tobacco: Never Used  Substance Use Topics  . Alcohol use: No    Alcohol/week: 0.0 oz       Family History :     Family History  Problem Relation Age of Onset  . Heart failure Father   . COPD Father   . Arthritis Father 54  . Stroke Mother   . Arthritis Mother 79  . Hyperlipidemia Mother   . Hypertension Mother   . Diabetes Mother   . Diabetes Sister   . Breast  cancer Unknown   . Breast cancer Maternal Aunt   . Asthma Maternal Aunt   . Birth defects Maternal Aunt   . Alcohol abuse Maternal Uncle   . Breast cancer Maternal Aunt   . Stomach cancer Neg Hx   . Colon cancer Neg Hx       Home Medications:   Prior to Admission medications   Medication Sig Start Date End Date Taking? Authorizing Provider  Cholecalciferol (VITAMIN D3) 2000 units TABS Take 2,000 Units by mouth every morning.   Yes [provider]  furosemide (LASIX) 40 MG tablet Take 1.5 tablets (60 mg total) by mouth daily. Patient taking differently: Take 40 mg by mouth daily.  06/24/17  Yes Nandigam, Venia Minks, MD  guaiFENesin (MUCINEX) 600 MG 12 hr tablet Take 2 tablets (1,200 mg total) by mouth 2 (two) times daily as needed for to loosen phlegm. 05/11/17  Yes Hosie Poisson, MD  insulin aspart (NOVOLOG FLEXPEN) 100 UNIT/ML FlexPen Inject into the skin. Sliding scale 121-150 - 2 units; 151-200 - 3 units; 201-250 -5 units; 251-300 - 8 units; 301-350 - 11units; 351-400 - 15 unitsc351-400 -15 units; greater than 400 -15 units and call MD.   Yes [provider]  lactulose (CHRONULAC) 10 GM/15ML solution TAKE 45 ML BY MOUTH TWICE DAILY AS NEEDED FOR MILD CONSTIPATION 06/24/17  Yes Nandigam, Venia Minks, MD  LEVEMIR FLEXTOUCH 100 UNIT/ML Pen INJECT 14 UNITS UNDER THE SKIN EVERY DAY AT 10PM 04/08/17  Yes Mosie Lukes, MD  mometasone (NASONEX) 50 MCG/ACT nasal spray Place 2 sprays into the nose daily. 03/30/17  Yes Mosie Lukes, MD  omeprazole (PRILOSEC) 20 MG capsule TAKE 1 CAPSULE(20 MG) BY MOUTH DAILY 06/16/17  Yes Mosie Lukes, MD  polyvinyl alcohol (LUBRICANT DROPS) 1.4 % ophthalmic solution Place 1 drop into both eyes as needed (dry eyes). 05/11/17  Yes Hosie Poisson, MD  rifaximin (XIFAXAN) 550 MG TABS tablet Take 1 tablet (550 mg total) by mouth 2 (two) times daily. 09/04/16  Yes E[place], Amy S, PA-C  spironolactone (ALDACTONE) 100 MG tablet Take 1.5 tablets (150 mg  total) by mouth daily. Patient taking differently: Take 100 mg by mouth daily.  06/24/17  Yes Nandigam, Venia Minks, MD  Tiotropium Bromide-Olodaterol (STIOLTO RESPIMAT) 2.5-2.5  MCG/ACT AERS Inhale 2 puffs into the lungs daily. 03/15/17  Yes Rigoberto Noel, MD  albuterol (ACCUNEB) 1.25 MG/3ML nebulizer solution as directed. 04/04/17   [provider]     Allergies:     Allergies  Allergen Reactions  . Citalopram Palpitations    Irregular heart beat  . Ciprofloxacin     Mental status change  . Erythromycin Other (See Comments)    Stomach cramps  . Glimepiride     Elevated ammonia levels  . Prednisone     Increased blood sugars too high     Physical Exam:   Vitals  Blood pressure (!) 122/103, pulse 98, temperature 97.7 F (36.5 C), temperature source Oral, resp. rate (!) 32, height 5\' 5"  (1.651 m), weight 71.2 kg (157 lb), SpO2 91 %.  1.  General: Appears in no acute distress  2. Psychiatric:  Intact judgement and  insight, awake alert, oriented x 3.  3. Neurologic: No focal neurological deficits, all cranial nerves intact.Strength 5/5 all 4 extremities, sensation intact all 4 extremities, plantars down going.  4. Eyes :  anicteric sclerae, moist conjunctivae with no lid lag. PERRLA.  5. ENMT:  Oropharynx clear with moist mucous membranes and good dentition  6. Neck:  supple, no cervical lymphadenopathy appriciated, No thyromegaly  7. Respiratory : Normal respiratory effort, bilateral wheezing  8. Cardiovascular : RRR, no gallops, rubs or murmurs, no leg edema  9. Gastrointestinal:  Positive bowel sounds, abdomen soft, non-tender to palpation,no hepatosplenomegaly, no rigidity or guarding       10. Skin:  No cyanosis, normal texture and turgor, no rash, lesions or ulcers  11.Musculoskeletal:  Good muscle tone,  joints appear normal , no effusions,  normal range of motion    Data Review:    CBC Recent Labs  Lab 06/24/17 1117 06/25/17 1227  WBC  20.1 Repeated and verified X2.* 20.3*  HGB 13.9 12.7  HCT 42.4 37.2  PLT 141.0* 108*  MCV 100.6* 96.1  MCH  --  32.8  MCHC 32.7 34.1  RDW 16.1* 15.4  LYMPHSABS 1.2 1.4  MONOABS 2.0* 2.0*  EOSABS 0.0 0.0  BASOSABS 0.2* 0.0   ------------------------------------------------------------------------------------------------------------------  Chemistries  Recent Labs  Lab 06/24/17 1117 06/25/17 1227  NA 135 129*  K 5.5* 4.6  CL 103 102  CO2 25 23  GLUCOSE 234* 255*  BUN 19 26*  CREATININE 1.00 0.93  CALCIUM 9.3 8.6*  AST 35 42*  ALT 27 27  ALKPHOS 137* 126  BILITOT 4.9* 4.4*   ------------------------------------------------------------------------------------------------------------------  ------------------------------------------------------------------------------------------------------------------ GFR: Estimated Creatinine Clearance: 52.5 mL/min (by C-G formula based on SCr of 0.93 mg/dL). Liver Function Tests: Recent Labs  Lab 06/24/17 1117 06/25/17 1227  AST 35 42*  ALT 27 27  ALKPHOS 137* 126  BILITOT 4.9* 4.4*  PROT 5.9* 5.6*  ALBUMIN 2.7* 2.4*   No results for input(s): LIPASE, AMYLASE in the last 168 hours. No results for input(s): AMMONIA in the last 168 hours. Coagulation Profile: Recent Labs  Lab 06/24/17 1117  INR 1.4*   Cardiac Enzymes: No results for input(s): CKTOTAL, CKMB, CKMBINDEX, TROPONINI in the last 168 hours. BNP (last 3 results) Recent Labs    05/26/17 1438  PROBNP 231.0*    --------------------------------------------------------------------------------------------------------------- Urine analysis:    Component Value Date/Time   COLORURINE AMBER (A) 06/25/2017 1500   APPEARANCEUR CLEAR 06/25/2017 1500   LABSPEC >1.030 (H) 06/25/2017 1500   PHURINE 5.5 06/25/2017 1500   GLUCOSEU NEGATIVE 06/25/2017 1500   GLUCOSEU  250 (A) 04/14/2017 1108   HGBUR NEGATIVE 06/25/2017 1500   BILIRUBINUR NEGATIVE 06/25/2017 1500    BILIRUBINUR neg 12/02/2016 1508   KETONESUR NEGATIVE 06/25/2017 1500   PROTEINUR NEGATIVE 06/25/2017 1500   UROBILINOGEN 0.2 04/14/2017 1108   NITRITE NEGATIVE 06/25/2017 1500   LEUKOCYTESUR NEGATIVE 06/25/2017 1500      Imaging Results:    Dg Chest Port 1 View  Result Date: 06/25/2017 CLINICAL DATA:  COPD. Shortness of breath for several weeks. Cough. Rule out pneumonia. EXAM: PORTABLE CHEST 1 VIEW COMPARISON:  05/22/2017 FINDINGS: Numerous leads and wires project over the chest. Midline trachea. Normal heart size. Atherosclerosis in the transverse aorta. The left costophrenic angle is minimally excluded. No pleural effusion or pneumothorax. Diffuse peribronchial thickening. No lobar consolidation. IMPRESSION: No evidence of pneumonia. Moderate interstitial thickening is similar, likely related to COPD/ chronic bronchitis. Electronically Signed   By: Abigail Miyamoto M.D.   On: 06/25/2017 11:51    My personal review of EKG: Rhythm NSR   Assessment & Plan:    Active Problems:   COPD exacerbation ([place])   1. COPD exacerbation- patient has allergy to prednisone will not start Solu-Medrol at this time. Start DuoNeb nebs every 6 hours. 2. Sepsis/early pneumonia- patient has elevated WBC present lactic acidosis. Started on vancomycin and cefepime. Follow blood culture results 3. Diabetes mellitus-continue Levemir, sliding scale insulin with NovoLog. 4. [name] liver cirrhosis- continue lactulose, Aldactone, Lasix, rifaximin    DVT Prophylaxis-   Lovenox  AM Labs Ordered, also please review Full Orders  Family Communication: Admission, patients condition and plan of care including tests being ordered have been discussed with the patient  who indicate understanding and agree with the plan and Code Status.  Code Status: DO NOT RESUSCITATE  Admission status: Inpatient   Time spent in minutes : 60 minutes   Oswald Hillock M.D on 06/25/2017 at 6:17 PM  Between 7am to 7pm - Pager -  [phone]. After 7pm go to [email or web address] - password [facility]  [facility] - Office  [phone]

## 2017-06-25 NOTE — ED Notes (Signed)
Pt on cardiac monitor and auto VS 

## 2017-06-25 NOTE — Discharge Instructions (Signed)
Transfer to [facility]

## 2017-06-25 NOTE — ED Notes (Signed)
Patient stated that she is breathing harder than usual.  She is a former smoker quit last spring.

## 2017-06-25 NOTE — ED Notes (Signed)
ED Provider at bedside. 

## 2017-06-25 NOTE — ED Provider Notes (Addendum)
[facility] DEPARTMENT Provider Note   CSN: [id number] Arrival date & time: 06/25/17  1049     History   Chief Complaint Chief Complaint  Patient presents with  . Eye Problem  . Abnormal Lab    HPI DESHA BITNER is a 76 y.o. female.  Patient w hx copd  c/o increased productive cough, greenish sputum, increased sob, general malaise, fatigue, in the past several days. Symptoms moderate, persistent, worse today.  Today notes bil eye redness and matting/discharge. Went to her GI doctor yesterday, had labs, and was told WBC was high. Patient denies sore throat. No chest pain. No abd pain. No vomiting or diarrhea. No gu c/o. No fevers.    The history is provided by the patient.  Eye Problem   Associated symptoms include discharge and eye redness. Pertinent negatives include no vomiting.  Abnormal Lab    Past Medical History:  Diagnosis Date  . Abdominal aortic aneurysm ([place]) 10/01/2013   Fall of 2014 3.3 per patient, follows with Vascular surgeon.   . Acute respiratory failure with hypoxia ([place]) 07/31/2015  . Allergic state 11/10/2016  . Anxiety   . Anxiety and depression 02/01/2014  . Arthritis of both knees 10/01/2013  . Arthritis of right knee 10/01/2013   Follows with Dr Mayer Camel   . Benign paroxysmal positional vertigo 10/01/2013  . Breast cancer ([place])    No disease activity On Femara Follows with Dr Marin Olp Right mastectomy performed by Dr Autumn Messing   . Cancer [facility]) breast ca  right  . Chronic respiratory failure ([place]) 08/29/2015  . 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 COPD (chronic obstructive pulmonary disease) ([place]) 10/01/2013  . COPD with acute exacerbation ([place]) 05/05/2017  . Depression   . Dermatitis 03/30/2017  . Diabetes mellitus type 2  . Diabetes mellitus type 2, controlled ([place]) 10/16/2014  . Emphysema   . Encephalopathy, hepatic ([place]) 06/07/2014  . Esophageal reflux 10/01/2013  . Fall 07/30/2016  . Hyperlipidemia   . Hyperlipidemia, mixed    . Increased ammonia level 11/25/2014  . NASH (nonalcoholic steatohepatitis) 08/26/2015  . Neck pain 10/01/2013  . Neuropathy    feet   . Osteopenia 03/30/2017  . Overactive bladder 12/10/2013  . Panic attacks   . Pedal edema 12/10/2013  . Personal history of radiation therapy   . Preventative health care 03/08/2016  . Thrombocytopenia ([place]) 03/17/2012  . Tobacco abuse disorder 02/01/2014  . Type 2 diabetes mellitus with hyperglycemia, with long-term current use of insulin ([place])   . Urine frequency 04/13/2017    Patient Active Problem List   Diagnosis Date Noted  . Ventricular tachycardia ([place]) 05/22/2017  . Acute kidney injury ([place]) 05/17/2017  . Hyperkalemia 05/17/2017  . Vitamin D deficiency 05/17/2017  . COPD with acute exacerbation ([place]) 05/05/2017  . Urine frequency 04/13/2017  . Osteopenia 03/30/2017  . Dermatitis 03/30/2017  . Allergic state 11/10/2016  . 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 Portal hypertensive gastropathy ([place])   . Lower back injury, initial encounter 08/05/2016  . Fall 07/30/2016  . Preventative health care 03/08/2016  . Muscle spasm 02/25/2016  . Chronic respiratory failure ([place]) 08/29/2015  . NASH (nonalcoholic steatohepatitis) 08/26/2015  . Type 2 diabetes mellitus with hyperglycemia, with long-term current use of insulin ([place])   . Cirrhosis of liver without ascites ([place]) 07/31/2015  . Acute respiratory failure with hypoxia ([place]) 07/31/2015  . Diarrhea 12/30/2014  . Increased ammonia level 11/25/2014  . Diabetes mellitus  type 2, controlled ([place]) 10/16/2014  . Encephalopathy, hepatic ([place]) 06/07/2014  . Right knee pain 06/07/2014  . Sun-damaged skin 02/01/2014  . Anxiety and depression 02/01/2014  . Tobacco abuse 02/01/2014  . Medicare annual wellness visit, subsequent 02/01/2014  . Pedal edema 12/10/2013  . Overactive bladder 12/10/2013  . Abdominal aortic aneurysm ([place]) 10/01/2013  . Arthritis of right knee 10/01/2013  . Benign paroxysmal  positional vertigo 10/01/2013  . Neck pain 10/01/2013  . Esophageal reflux 10/01/2013  . Thrombocytopenia ([place]) 03/17/2012  . 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   . Hyperlipidemia, mixed   . Breast cancer [facility])     Past Surgical History:  Procedure Laterality Date  . APPENDECTOMY  2007  . BREAST SURGERY  2009 right  . CATARACT EXTRACTION     x 2  . ESOPHAGOGASTRODUODENOSCOPY (EGD) WITH PROPOFOL N/A 08/14/2016   Procedure: ESOPHAGOGASTRODUODENOSCOPY (EGD) WITH PROPOFOL;  Surgeon: Mauri Pole, MD;  Location: [facility] ENDOSCOPY;  Service: Endoscopy;  Laterality: N/A;  . [place]  . KNEE SURGERY    . MANDIBLE FRACTURE SURGERY    . MASTECTOMY    . PILONIDAL CYST EXCISION    . TONSILLECTOMY      OB History    No data available       Home Medications    Prior to Admission medications   Medication Sig Start Date End Date Taking? Authorizing Provider  albuterol (ACCUNEB) 1.25 MG/3ML nebulizer solution as directed. 04/04/17   [provider]  Cholecalciferol (VITAMIN D3) 2000 units TABS Take 2,000 Units by mouth every morning.    [provider]  furosemide (LASIX) 40 MG tablet Take 1.5 tablets (60 mg total) by mouth daily. 06/24/17   Mauri Pole, MD  guaiFENesin (MUCINEX) 600 MG 12 hr tablet Take 2 tablets (1,200 mg total) by mouth 2 (two) times daily as needed for to loosen phlegm. 05/11/17   Hosie Poisson, MD  insulin aspart (NOVOLOG FLEXPEN) 100 UNIT/ML FlexPen Inject into the skin. Sliding scale 121-150 - 2 units; 151-200 - 3 units; 201-250 -5 units; 251-300 - 8 units; 301-350 - 11units; 351-400 - 15 unitsc351-400 -15 units; greater than 400 -15 units and call MD.    [provider]  lactulose (CHRONULAC) 10 GM/15ML solution TAKE 45 ML BY MOUTH TWICE DAILY AS NEEDED FOR MILD CONSTIPATION 06/24/17   Nandigam, Venia Minks, MD  LEVEMIR FLEXTOUCH 100 UNIT/ML Pen INJECT 14 UNITS UNDER THE SKIN EVERY DAY  AT 10PM 04/08/17   Mosie Lukes, MD  mometasone (NASONEX) 50 MCG/ACT nasal spray Place 2 sprays into the nose daily. 03/30/17   Mosie Lukes, MD  omeprazole (PRILOSEC) 20 MG capsule TAKE 1 CAPSULE(20 MG) BY MOUTH DAILY 06/16/17   Mosie Lukes, MD  polyvinyl alcohol (LUBRICANT DROPS) 1.4 % ophthalmic solution Place 1 drop into both eyes as needed (dry eyes). 05/11/17   Hosie Poisson, MD  rifaximin (XIFAXAN) 550 MG TABS tablet Take 1 tablet (550 mg total) by mouth 2 (two) times daily. 09/04/16   Esterwood, Amy S, PA-C  spironolactone (ALDACTONE) 100 MG tablet Take 1.5 tablets (150 mg total) by mouth daily. 06/24/17   Mauri Pole, MD  Tiotropium Bromide-Olodaterol (STIOLTO RESPIMAT) 2.5-2.5 MCG/ACT AERS Inhale 2 puffs into the lungs daily. 03/15/17   Rigoberto Noel, MD    Family History Family History  Problem Relation Age of Onset  . Heart failure Father   . COPD Father   . Arthritis  Father 72  . Stroke Mother   . Arthritis Mother 90  . Hyperlipidemia Mother   . Hypertension Mother   . Diabetes Mother   . Diabetes Sister   . Breast cancer Unknown   . Breast cancer Maternal Aunt   . Asthma Maternal Aunt   . Birth defects Maternal Aunt   . Alcohol abuse Maternal Uncle   . Breast cancer Maternal Aunt   . Stomach cancer Neg Hx   . Colon cancer Neg Hx     Social History Social History   Tobacco Use  . Smoking status: Current Every Day Smoker    Packs/day: 0.25    Years: 58.00    Pack years: 14.50    Types: Cigarettes    Start date: 07/27/1964  . Smokeless tobacco: Never Used  Substance Use Topics  . Alcohol use: No    Alcohol/week: 0.0 oz  . Drug use: No     Allergies   Citalopram; Ciprofloxacin; Erythromycin; Glimepiride; and Prednisone   Review of Systems Review of Systems  Constitutional: Negative for chills and fever.  HENT: Negative for sore throat.   Eyes: Positive for discharge and redness.  Respiratory: Positive for cough, shortness of breath and  wheezing.   Cardiovascular: Negative for chest pain.  Gastrointestinal: Negative for abdominal pain and vomiting.  Endocrine: Negative for polyuria.  Genitourinary: Negative for flank pain.  Musculoskeletal: Negative for back pain and neck pain.  Skin: Negative for rash.  Neurological: Negative for headaches.  Hematological: Does not bruise/bleed easily.  Psychiatric/Behavioral: Negative for confusion.     Physical Exam Updated Vital Signs BP (!) 109/52   Pulse 91   Temp 98.9 F (37.2 C) (Oral)   Resp 17   Ht 1.651 m (5\' 5" )   Wt 71.2 kg (157 lb)   LMP  (LMP Unknown)   SpO2 94% Comment: on 2 liters via [place]  BMI 26.13 kg/m   Physical Exam  Constitutional: She appears well-developed and well-nourished. No distress.  HENT:  Mouth/Throat: Oropharynx is clear and moist.  Eyes: Conjunctivae are normal. No scleral icterus.  Neck: Neck supple. No tracheal deviation present.  Cardiovascular: Normal rate, regular rhythm, normal heart sounds and intact distal pulses.  No murmur heard. Pulmonary/Chest: Effort normal. No respiratory distress. She has wheezes. She has rales.  Abdominal: Soft. Normal appearance and bowel sounds are normal. She exhibits no distension. There is no tenderness.  Musculoskeletal: She exhibits edema.  Neurological: She is alert.  Skin: Skin is warm and dry. No rash noted. She is not diaphoretic.  Psychiatric: She has a normal mood and affect.  Nursing note and vitals reviewed.    ED Treatments / Results  Labs (all labs ordered are listed, but only abnormal results are displayed) Results for orders placed or performed during the hospital encounter of 06/25/17  CBC with Differential  Result Value Ref Range   WBC 20.3 (H) 4.0 - 10.5 K/uL   RBC 3.87 3.87 - 5.11 MIL/uL   Hemoglobin 12.7 12.0 - 15.0 g/dL   HCT 37.2 36.0 - 46.0 %   MCV 96.1 78.0 - 100.0 fL   MCH 32.8 26.0 - 34.0 pg   MCHC 34.1 30.0 - 36.0 g/dL   RDW 15.4 11.5 - 15.5 %   Platelets 108  (L) 150 - 400 K/uL   Neutrophils Relative % 83 %   Lymphocytes Relative 7 %   Monocytes Relative 10 %   Eosinophils Relative 0 %   Basophils Relative 0 %  Neutro Abs 16.9 (H) 1.7 - 7.7 K/uL   Lymphs Abs 1.4 0.7 - 4.0 K/uL   Monocytes Absolute 2.0 (H) 0.1 - 1.0 K/uL   Eosinophils Absolute 0.0 0.0 - 0.7 K/uL   Basophils Absolute 0.0 0.0 - 0.1 K/uL   WBC Morphology TOXIC GRANULATION   Comprehensive metabolic panel  Result Value Ref Range   Sodium 129 (L) 135 - 145 mmol/L   Potassium 4.6 3.5 - 5.1 mmol/L   Chloride 102 101 - 111 mmol/L   CO2 23 22 - 32 mmol/L   Glucose, Bld 255 (H) 65 - 99 mg/dL   BUN 26 (H) 6 - 20 mg/dL   Creatinine, Ser 0.93 0.44 - 1.00 mg/dL   Calcium 8.6 (L) 8.9 - 10.3 mg/dL   Total Protein 5.6 (L) 6.5 - 8.1 g/dL   Albumin 2.4 (L) 3.5 - 5.0 g/dL   AST 42 (H) 15 - 41 U/L   ALT 27 14 - 54 U/L   Alkaline Phosphatase 126 38 - 126 U/L   Total Bilirubin 4.4 (H) 0.3 - 1.2 mg/dL   GFR calc non Af Amer 59 (L) >60 mL/min   GFR calc Af Amer >60 >60 mL/min   Anion gap 4 (L) 5 - 15  I-Stat CG4 Lactic Acid, ED  (not at  [facility])  Result Value Ref Range   Lactic Acid, Venous 3.75 (HH) 0.5 - 1.9 mmol/L   Comment NOTIFIED PHYSICIAN    Dg Chest Port 1 View  Result Date: 06/25/2017 CLINICAL DATA:  COPD. Shortness of breath for several weeks. Cough. Rule out pneumonia. EXAM: PORTABLE CHEST 1 VIEW COMPARISON:  05/22/2017 FINDINGS: Numerous leads and wires project over the chest. Midline trachea. Normal heart size. Atherosclerosis in the transverse aorta. The left costophrenic angle is minimally excluded. No pleural effusion or pneumothorax. Diffuse peribronchial thickening. No lobar consolidation. IMPRESSION: No evidence of pneumonia. Moderate interstitial thickening is similar, likely related to COPD/ chronic bronchitis. Electronically Signed   By: Abigail Miyamoto M.D.   On: 06/25/2017 11:51   US Abdomen Limited Ruq  Result Date: 05/27/2017 CLINICAL DATA:  Hepatic cirrhosis EXAM:  ULTRASOUND ABDOMEN LIMITED RIGHT UPPER QUADRANT COMPARISON:  February 20, 2017 FINDINGS: Gallbladder: Surgically absent. Common bile duct: Diameter: 9 mm which may be compatible with post cholecystectomy state. No biliary duct mass or calculus evident. Liver: No focal lesion identified. Liver has a mildly irregular contour with a coarse, increased echotexture pattern. Portal vein is patent on color Doppler imaging with normal direction of blood flow away from the liver. There is trace ascites. IMPRESSION: The appearance of the liver is consistent with known cirrhosis. The appearance of the liver is essentially stable compared to the prior study. While no focal liver lesions are evident by ultrasound, it must be cautioned that the sensitivity of ultrasound for detection of focal liver lesions is diminished in this circumstance. Portal vein flow flow appears hepatofugal. Gallbladder absent. Common bile duct diameter within normal limits for postcholecystectomy state. Electronically Signed   By: Lowella Grip III M.D.   On: 05/27/2017 14:53    EKG  EKG Interpretation  Date/Time:  Friday June 25 2017 11:13:30 EST Ventricular Rate:  95 PR Interval:    QRS Duration: 93 QT Interval:  356 QTC Calculation: 448 R Axis:   82 Text Interpretation:  Sinus rhythm Baseline wander No significant change since last tracing Confirmed by Lajean Saver [phone]) on 06/25/2017 11:15:24 AM       Radiology Dg Chest [facility]  Result Date: 06/25/2017 CLINICAL DATA:  COPD. Shortness of breath for several weeks. Cough. Rule out pneumonia. EXAM: PORTABLE CHEST 1 VIEW COMPARISON:  05/22/2017 FINDINGS: Numerous leads and wires project over the chest. Midline trachea. Normal heart size. Atherosclerosis in the transverse aorta. The left costophrenic angle is minimally excluded. No pleural effusion or pneumothorax. Diffuse peribronchial thickening. No lobar consolidation. IMPRESSION: No evidence of pneumonia. Moderate  interstitial thickening is similar, likely related to COPD/ chronic bronchitis. Electronically Signed   By: Abigail Miyamoto M.D.   On: 06/25/2017 11:51    Procedures Procedures (including critical care time)  Medications Ordered in ED Medications  tobramycin (TOBREX) 0.3 % ophthalmic solution 2 drop (not administered)  ceFEPIme (MAXIPIME) 2 g in dextrose 5 % 50 mL IVPB (not administered)  vancomycin (VANCOCIN) IVPB 1000 mg/200 mL premix (not administered)  albuterol (PROVENTIL) (2.5 MG/3ML) 0.083% nebulizer solution 5 mg (5 mg Nebulization Given 06/25/17 1135)  ipratropium (ATROVENT) nebulizer solution 0.5 mg (0.5 mg Nebulization Given 06/25/17 1135)     Initial Impression / Assessment and Plan / ED Course  I have reviewed the triage vital signs and the nursing notes.  Pertinent labs & imaging results that were available during my care of the patient were reviewed by me and considered in my medical decision making (see chart for details).  Iv ns. Labs. Pt hypoxic on room air, o2 sats 87%. o2 [place]. Cultures. Stat pcxr.  Productive cough, rales esp right. Suspect pna.  Hospitalized last month.  Will cover for possible hcap.   Iv abx given.  Albuterol and atrovent neb.   Lactate is elevated, iv ns bolus.   Reviewed nursing notes and prior charts for additional history.   Additional ns iv.    Persistent wheezing.  Additional albuterol neb.   Repeat lactate pending.   Given copd exacerbation, resp infxn suspect pna, hypoxia, elev lactate - will admit.  Hospitalist at [facility] consulted for admission.  Discussed pt with Dr Shanon Brow who accepts to stepdown bed at [facility].      Final Clinical Impressions(s) / ED Diagnoses   Final diagnoses:  None    ED Discharge Orders    None           Lajean Saver, MD 06/25/17 1527

## 2017-06-25 NOTE — ED Triage Notes (Signed)
Per epic note, pt was called and told to go to ED for elevated WC

## 2017-06-25 NOTE — Progress Notes (Signed)
Pharmacy Antibiotic Note  Ashley Savage is a 76 y.o. female admitted on 06/25/2017 with pneumonia.  Pharmacy has been consulted for vancomycin/cefepime dosing. Afebrile, SCr 0.93, CrCl~53.  Currently receiving cefepime 2g IV x 1 and vancomycin 1g IV x1 at [facility].  Plan: Vancomycin 750mg  IV q12h Cefepime 1g IV q8h Monitor clinical progress, c/s, renal function F/u de-escalation plan/LOT, vancomycin trough as indicated   Height: 5\' 5"  (165.1 cm) Weight: 157 lb (71.2 kg) IBW/kg (Calculated) : 57  Temp (24hrs), Avg:98.9 F (37.2 C), Min:98.9 F (37.2 C), Max:98.9 F (37.2 C)  Recent Labs  Lab 06/24/17 1117 06/25/17 1153  WBC 20.1 Repeated and verified X2.*  --   CREATININE 1.00  --   LATICACIDVEN  --  3.75*    Estimated Creatinine Clearance: 48.9 mL/min (by C-G formula based on SCr of 1 mg/dL).    Allergies  Allergen Reactions  . Citalopram Palpitations    Irregular heart beat  . Ciprofloxacin     Mental status change  . Erythromycin Other (See Comments)    Stomach cramps  . Glimepiride     Elevated ammonia levels  . Prednisone     Increased blood sugars too high    Antimicrobials this admission: 11/30 vanc >>  11/30 cefepime >>   Dose adjustments this admission:   Microbiology results: 11/30 BCx:   Elicia Lamp, PharmD, BCPS Clinical Pharmacist Rx Phone # for today: [phone] After 3:30PM, please call Main Rx: [id number] 06/25/2017 12:19 PM

## 2017-06-25 NOTE — ED Notes (Signed)
Eye discharge noted. Pt c/o bilateral eyes being sticky but denies pain.

## 2017-06-26 DIAGNOSIS — H10023 Other mucopurulent conjunctivitis, bilateral: Secondary | ICD-10-CM

## 2017-06-26 DIAGNOSIS — J988 Other specified respiratory disorders: Secondary | ICD-10-CM

## 2017-06-26 LAB — CBC
HEMATOCRIT: 35.4 % — AB (ref 36.0–46.0)
HEMOGLOBIN: 11.8 g/dL — AB (ref 12.0–15.0)
MCH: 32.7 pg (ref 26.0–34.0)
MCHC: 33.3 g/dL (ref 30.0–36.0)
MCV: 98.1 fL (ref 78.0–100.0)
Platelets: 104 10*3/uL — ABNORMAL LOW (ref 150–400)
RBC: 3.61 MIL/uL — AB (ref 3.87–5.11)
RDW: 15.5 % (ref 11.5–15.5)
WBC: 19 10*3/uL — ABNORMAL HIGH (ref 4.0–10.5)

## 2017-06-26 LAB — COMPREHENSIVE METABOLIC PANEL
ALBUMIN: 2.1 g/dL — AB (ref 3.5–5.0)
ALK PHOS: 109 U/L (ref 38–126)
ALT: 25 U/L (ref 14–54)
ANION GAP: 4 — AB (ref 5–15)
AST: 29 U/L (ref 15–41)
BILIRUBIN TOTAL: 3.1 mg/dL — AB (ref 0.3–1.2)
BUN: 29 mg/dL — AB (ref 6–20)
CALCIUM: 8.8 mg/dL — AB (ref 8.9–10.3)
CO2: 24 mmol/L (ref 22–32)
CREATININE: 0.84 mg/dL (ref 0.44–1.00)
Chloride: 105 mmol/L (ref 101–111)
GFR calc Af Amer: >60 mL/min (ref >60)
GFR calc non Af Amer: >60 mL/min (ref >60)
GLUCOSE: 145 mg/dL — AB (ref 65–99)
Potassium: 4.7 mmol/L (ref 3.5–5.1)
Sodium: 133 mmol/L — ABNORMAL LOW (ref 135–145)
Total Protein: 4.9 g/dL — ABNORMAL LOW (ref 6.5–8.1)

## 2017-06-26 LAB — GLUCOSE, CAPILLARY
GLUCOSE-CAPILLARY: 200 mg/dL — AB (ref 65–99)
Glucose-Capillary: 105 mg/dL — ABNORMAL HIGH (ref 65–99)
Glucose-Capillary: 234 mg/dL — ABNORMAL HIGH (ref 65–99)
Glucose-Capillary: 280 mg/dL — ABNORMAL HIGH (ref 65–99)

## 2017-06-26 LAB — HEMOGLOBIN A1C
Hgb A1c MFr Bld: 5.9 % — ABNORMAL HIGH (ref 4.8–5.6)
MEAN PLASMA GLUCOSE: 122.63 mg/dL

## 2017-06-26 LAB — LACTIC ACID, PLASMA: Lactic Acid, Venous: 2.4 mmol/L (ref 0.5–1.9)

## 2017-06-26 MED ORDER — TOBRAMYCIN 0.3 % OP SOLN
2.0000 [drp] | Freq: Four times a day (QID) | OPHTHALMIC | Status: DC
  Administered 2017-06-26 – 2017-06-30 (×16): 2 [drp] via OPHTHALMIC
  Filled 2017-06-26: qty 5

## 2017-06-26 MED ORDER — AMOXICILLIN-POT CLAVULANATE 875-125 MG PO TABS
1.0000 | ORAL_TABLET | Freq: Two times a day (BID) | ORAL | Status: DC
  Administered 2017-06-26 – 2017-06-30 (×9): 1 via ORAL
  Filled 2017-06-26 (×9): qty 1

## 2017-06-26 MED ORDER — IBUPROFEN 200 MG PO TABS
400.0000 mg | ORAL_TABLET | Freq: Once | ORAL | Status: AC
  Administered 2017-06-26: 400 mg via ORAL
  Filled 2017-06-26: qty 2

## 2017-06-26 MED ORDER — GUAIFENESIN ER 600 MG PO TB12
600.0000 mg | ORAL_TABLET | Freq: Two times a day (BID) | ORAL | Status: DC
  Administered 2017-06-26 – 2017-06-30 (×9): 600 mg via ORAL
  Filled 2017-06-26 (×9): qty 1

## 2017-06-26 MED ORDER — METHYLPREDNISOLONE SODIUM SUCC 40 MG IJ SOLR
40.0000 mg | Freq: Four times a day (QID) | INTRAMUSCULAR | Status: DC
  Administered 2017-06-26 – 2017-06-29 (×11): 40 mg via INTRAVENOUS
  Filled 2017-06-26 (×12): qty 1

## 2017-06-26 NOTE — Progress Notes (Addendum)
[facility]  PROGRESS NOTE  Ashley Savage OAC:[id number] DOB: 04/11/1941 DOA: 06/25/2017 PCP: Mosie Lukes, MD   Brief HPI:    76 y.o. female,. with history of liver cirrhosis, NASH, diabetes mellitus, hyperlipidemia, COPD who came to ED for worsening shortness of breath, productive cough with greenish sputum. Patient says that symptoms have been going on for past few days. She went to her gastroneurologist yesterday who checked her labs and found that she had increased WBC. Patient denies chest pain. She denies nausea vomiting or diarrhea. Denies dysuria urgency or frequency of urination.  At [facility] chest x-ray was done which did not show pneumonia. She was found to have white count of 20,000, lactic acidosis with lactic acid of 3.75. Patient basically started on vancomycin and cefepime.     Subjective      Assessment/Plan:     1. COPD exacerbation-start Solu-Medrol 40 mg IV every 6 hours, change antibiotics to Augmentin 1 tablet p.o. twice daily, DuoNeb nebulizers every 6 hours, start Mucinex 1 tablet p.o. twice daily. 2. SIRS-patient presented with lactic acidosis, leukocytosis and was empirically started on vancomycin and cefepime.  Chest x-ray showed no pneumonia.  Will discontinue IV antibiotics at this time.  Levaquin as above. 3. Diabetes mellitus-continue Levemir, sliding scale insulin with NovoLog. 4. Nash liver cirrhosis-continue lactulose, Aldactone, Lasix, rifaximin. 5. Acute conjunctivitis-continue Tobrex eyedrops     DVT prophylaxis: Lovenox  Code Status: DNR  Family Communication: Discussed with patient  Disposition Plan: Likely home in 1-2 days  Consultants:  None  Procedures:  None  Continuous infusions . sodium chloride        Antibiotics:   Anti-infectives (From admission, onward)   Start     Dose/Rate Route Frequency Ordered Stop   06/26/17 1430  amoxicillin-clavulanate (AUGMENTIN) 875-125 MG per tablet 1  tablet     1 tablet Oral Every 12 hours 06/26/17 1416     06/25/17 2200  vancomycin (VANCOCIN) IVPB 750 mg/150 ml premix  Status:  Discontinued     750 mg 150 mL/hr over 60 Minutes Intravenous Every 12 hours 06/25/17 1342 06/25/17 1819   06/25/17 2200  rifaximin (XIFAXAN) tablet 550 mg     550 mg Oral 2 times daily 06/25/17 1819     06/25/17 2000  ceFEPIme (MAXIPIME) 1 g in dextrose 5 % 50 mL IVPB  Status:  Discontinued     1 g 100 mL/hr over 30 Minutes Intravenous Every 8 hours 06/25/17 1342 06/25/17 1819   06/25/17 1203  ceFEPIme (MAXIPIME) 2 g injection    Comments:  Simms, Marva   : cabinet override      06/25/17 1203 06/25/17 1211   06/25/17 1200  ceFEPIme (MAXIPIME) 2 g in dextrose 5 % 50 mL IVPB     2 g 100 mL/hr over 30 Minutes Intravenous  Once 06/25/17 1156 06/25/17 1245   06/25/17 1200  vancomycin (VANCOCIN) IVPB 1000 mg/200 mL premix     1,000 mg 200 mL/hr over 60 Minutes Intravenous  Once 06/25/17 1156 06/25/17 1312       Objective   Vitals:   06/26/17 0500 06/26/17 0600 06/26/17 0800 06/26/17 1000  BP:  100/70 106/64 (!) 102/39  Pulse:      Resp:  (!) 21    Temp:   (!) 97.4 F (36.3 C)   TempSrc:   Oral   SpO2:  94% 91% (!) 87%  Weight: 73 kg (160 lb 15 oz)     Height:  Intake/Output Summary (Last 24 hours) at 06/26/2017 1417 Last data filed at 06/26/2017 0600 Gross per 24 hour  Intake 1180 ml  Output 150 ml  Net 1030 ml   Filed Weights   06/25/17 1102 06/26/17 0500  Weight: 71.2 kg (157 lb) 73 kg (160 lb 15 oz)     Physical Examination:   Physical Exam: Eyes: Erythematous conjunctiva bilaterally Mouth: Oral mucosa is moist, no lesions on palate,  Neck: Supple, no deformities, masses, or tenderness Lungs: Normal respiratory effort, bilateral clear to auscultation, no crackles or wheezes.  Heart: Regular rate and rhythm, S1 and S2 normal, no murmurs, rubs auscultated Abdomen: BS normoactive,soft,nondistended,non-tender to palpation,no  organomegaly Extremities: No pretibial edema, no erythema, no cyanosis, no clubbing Neuro : Alert and oriented to time, place and person, No focal deficits  Skin: No rashes seen on exam     Data Reviewed: I have personally reviewed following labs and imaging studies  CBG: Recent Labs  Lab 06/25/17 2038 06/25/17 2124 06/26/17 0739 06/26/17 1153  GLUCAP 201* 203* 105* 234*    CBC: Recent Labs  Lab 06/24/17 1117 06/25/17 1227 06/26/17 0326  WBC 20.1 Repeated and verified X2.* 20.3* 19.0*  NEUTROABS 16.7* 16.9*  --   HGB 13.9 12.7 11.8*  HCT 42.4 37.2 35.4*  MCV 100.6* 96.1 98.1  PLT 141.0* 108* 104*    Basic Metabolic Panel: Recent Labs  Lab 06/24/17 1117 06/25/17 1227 06/26/17 0326  NA 135 129* 133*  K 5.5* 4.6 4.7  CL 103 102 105  CO2 25 23 24   GLUCOSE 234* 255* 145*  BUN 19 26* 29*  CREATININE 1.00 0.93 0.84  CALCIUM 9.3 8.6* 8.8*    Recent Results (from the past 240 hour(s))  Blood Culture (routine x 2)     Status: None (Preliminary result)   Collection Time: 06/25/17 11:47 AM  Result Value Ref Range Status   Specimen Description BLOOD LEFT ANTECUBITAL  Final   Special Requests   Final    BOTTLES DRAWN AEROBIC AND ANAEROBIC Blood Culture adequate volume   Culture   Final    NO GROWTH < 24 HOURS Performed at [facility], [address], [place], [place] [address]    Report Status PENDING  Incomplete  Blood Culture (routine x 2)     Status: None (Preliminary result)   Collection Time: 06/25/17 11:55 AM  Result Value Ref Range Status   Specimen Description BLOOD BLOOD LEFT HAND  Final   Special Requests   Final    BOTTLES DRAWN AEROBIC AND ANAEROBIC Blood Culture adequate volume   Culture   Final    NO GROWTH < 24 HOURS Performed at [facility], [place] [address] [place], [place] [address]    Report Status PENDING  Incomplete  MRSA PCR Screening     Status: None   Collection Time: 06/25/17  5:28 PM  Result Value Ref Range Status    MRSA by PCR NEGATIVE NEGATIVE Final    Comment:        The GeneXpert MRSA Assay (FDA approved for NASAL specimens only), is one component of a comprehensive MRSA colonization surveillance program. It is not intended to diagnose MRSA infection nor to guide or monitor treatment for MRSA infections.      Liver Function Tests: Recent Labs  Lab 06/24/17 1117 06/25/17 1227 06/26/17 0326  AST 35 42* 29  ALT 27 27 25   ALKPHOS 137* 126 109  BILITOT 4.9* 4.4* 3.1*  PROT 5.9*  5.6* 4.9*  ALBUMIN 2.7* 2.4* 2.1*   No results for input(s): LIPASE, AMYLASE in the last 168 hours. No results for input(s): AMMONIA in the last 168 hours.  Cardiac Enzymes: No results for input(s): CKTOTAL, CKMB, CKMBINDEX, TROPONINI in the last 168 hours. BNP (last 3 results) No results for input(s): BNP in the last 8760 hours.  ProBNP (last 3 results) Recent Labs    05/26/17 1438  PROBNP 231.0*      Studies: Dg Chest Port 1 View  Result Date: 06/25/2017 CLINICAL DATA:  COPD. Shortness of breath for several weeks. Cough. Rule out pneumonia. EXAM: PORTABLE CHEST 1 VIEW COMPARISON:  05/22/2017 FINDINGS: Numerous leads and wires project over the chest. Midline trachea. Normal heart size. Atherosclerosis in the transverse aorta. The left costophrenic angle is minimally excluded. No pleural effusion or pneumothorax. Diffuse peribronchial thickening. No lobar consolidation. IMPRESSION: No evidence of pneumonia. Moderate interstitial thickening is similar, likely related to COPD/ chronic bronchitis. Electronically Signed   By: Abigail Miyamoto M.D.   On: 06/25/2017 11:51    Scheduled Meds: . amoxicillin-clavulanate  1 tablet Oral Q12H  . enoxaparin (LOVENOX) injection  40 mg Subcutaneous Q24H  . furosemide  60 mg Oral Daily  . guaiFENesin  600 mg Oral BID  . insulin aspart  0-9 Units Subcutaneous TID WC  . insulin detemir  14 Units Subcutaneous Q2200  . lactulose  10 g Oral Daily  . methylPREDNISolone  (SOLU-MEDROL) injection  40 mg Intravenous Q6H  . pantoprazole  40 mg Oral Daily  . rifaximin  550 mg Oral BID  . sodium chloride flush  3 mL Intravenous Q12H  . spironolactone  150 mg Oral Daily  . tobramycin  2 drop Both Eyes Q6H      Time spent: 25 min  Oswald Hillock   [facility] Pager [phone]. If 7PM-7AM, please contact night-coverage at [email or web address], Office  [phone]  password [email or web address]  06/26/2017, 2:17 PM  LOS: 1 day

## 2017-06-27 LAB — BASIC METABOLIC PANEL
Anion gap: 6 (ref 5–15)
BUN: 43 mg/dL — AB (ref 6–20)
CALCIUM: 9 mg/dL (ref 8.9–10.3)
CO2: 24 mmol/L (ref 22–32)
Chloride: 99 mmol/L — ABNORMAL LOW (ref 101–111)
Creatinine, Ser: 1.2 mg/dL — ABNORMAL HIGH (ref 0.44–1.00)
GFR calc Af Amer: 50 mL/min — ABNORMAL LOW (ref >60)
GFR, EST NON AFRICAN AMERICAN: 43 mL/min — AB (ref >60)
Glucose, Bld: 188 mg/dL — ABNORMAL HIGH (ref 65–99)
Potassium: 5.5 mmol/L — ABNORMAL HIGH (ref 3.5–5.1)
Sodium: 129 mmol/L — ABNORMAL LOW (ref 135–145)

## 2017-06-27 LAB — CBC
HCT: 36.7 % (ref 36.0–46.0)
Hemoglobin: 12.3 g/dL (ref 12.0–15.0)
MCH: 32.5 pg (ref 26.0–34.0)
MCHC: 33.5 g/dL (ref 30.0–36.0)
MCV: 96.8 fL (ref 78.0–100.0)
PLATELETS: 128 10*3/uL — AB (ref 150–400)
RBC: 3.79 MIL/uL — ABNORMAL LOW (ref 3.87–5.11)
RDW: 15 % (ref 11.5–15.5)
WBC: 17.5 10*3/uL — ABNORMAL HIGH (ref 4.0–10.5)

## 2017-06-27 LAB — GLUCOSE, CAPILLARY
GLUCOSE-CAPILLARY: 310 mg/dL — AB (ref 65–99)
Glucose-Capillary: 191 mg/dL — ABNORMAL HIGH (ref 65–99)
Glucose-Capillary: 252 mg/dL — ABNORMAL HIGH (ref 65–99)
Glucose-Capillary: 279 mg/dL — ABNORMAL HIGH (ref 65–99)

## 2017-06-27 MED ORDER — LACTULOSE 10 GM/15ML PO SOLN
30.0000 g | Freq: Every day | ORAL | Status: DC
  Administered 2017-06-27 – 2017-06-29 (×3): 30 g via ORAL
  Filled 2017-06-27 (×3): qty 45

## 2017-06-27 MED ORDER — SODIUM POLYSTYRENE SULFONATE 15 GM/60ML PO SUSP
15.0000 g | Freq: Once | ORAL | Status: AC
  Administered 2017-06-27: 15 g via ORAL
  Filled 2017-06-27: qty 60

## 2017-06-27 MED ORDER — IBUPROFEN 200 MG PO TABS
400.0000 mg | ORAL_TABLET | Freq: Once | ORAL | Status: AC
  Administered 2017-06-27: 400 mg via ORAL
  Filled 2017-06-27: qty 2

## 2017-06-27 NOTE — Plan of Care (Signed)
  Pain Managment: General experience of comfort will improve 06/27/2017 2004 - Completed/Met by Mickie Kay, RN

## 2017-06-27 NOTE — Progress Notes (Signed)
[facility]  PROGRESS NOTE  Ashley Savage KKX:[id number] DOB: [birth year] DOA: 06/25/2017 PCP: Mosie Lukes, MD   Brief HPI:    76 y.o. female,. with history of liver cirrhosis, NASH, diabetes mellitus, hyperlipidemia, COPD who came to ED for worsening shortness of breath, productive cough with greenish sputum. Patient says that symptoms have been going on for past few days. She went to her gastroneurologist yesterday who checked her labs and found that she had increased WBC. Patient denies chest pain. She denies nausea vomiting or diarrhea. Denies dysuria urgency or frequency of urination.  At [facility] chest x-ray was done which did not show pneumonia. She was found to have white count of 20,000, lactic acidosis with lactic acid of 3.75. Patient basically started on vancomycin and cefepime.     Subjective   Patient seen and examined, blood pressure is soft this morning.  Breathing is improved.  Afebrile.   Assessment/Plan:     1. COPD exacerbation-significantly improved, continue Solu-Medrol 40 mg IV every 6 hours,  Augmentin 1 tablet p.o. twice daily, DuoNeb nebulizers every 6 hours, started Mucinex 1 tablet p.o. twice daily. 2. SIRS-patient presented with lactic acidosis, leukocytosis and was empirically started on vancomycin and cefepime.  Chest x-ray showed no pneumonia.  IV antibiotics were discontinued .  Levaquin as above. 3. Diabetes mellitus-continue Levemir, sliding scale insulin with NovoLog. 4. Nash liver cirrhosis-continue lactulose, will hold Aldactone, Lasix 5. Acute conjunctivitis-continue Tobrex eyedrops  6. Hyperkalemia-potassium is 5.5 this morning.  Aldactone is currently on hold.  Give 1 dose of Kayexalate 15 g p.o. x1    DVT prophylaxis: Lovenox  Code Status: DNR  Family Communication: Discussed with patient  Disposition Plan: Likely home in 1-2 days  Consultants:  None  Procedures:  None  Continuous infusions . sodium  chloride        Antibiotics:   Anti-infectives (From admission, onward)   Start     Dose/Rate Route Frequency Ordered Stop   06/26/17 1500  amoxicillin-clavulanate (AUGMENTIN) 875-125 MG per tablet 1 tablet     1 tablet Oral Every 12 hours 06/26/17 1416     06/25/17 2200  vancomycin (VANCOCIN) IVPB 750 mg/150 ml premix  Status:  Discontinued     750 mg 150 mL/hr over 60 Minutes Intravenous Every 12 hours 06/25/17 1342 06/25/17 1819   06/25/17 2200  rifaximin (XIFAXAN) tablet 550 mg     550 mg Oral 2 times daily 06/25/17 1819     06/25/17 2000  ceFEPIme (MAXIPIME) 1 g in dextrose 5 % 50 mL IVPB  Status:  Discontinued     1 g 100 mL/hr over 30 Minutes Intravenous Every 8 hours 06/25/17 1342 06/25/17 1819   06/25/17 1203  ceFEPIme (MAXIPIME) 2 g injection    Comments:  Simms, Marva   : cabinet override      06/25/17 1203 06/25/17 1211   06/25/17 1200  ceFEPIme (MAXIPIME) 2 g in dextrose 5 % 50 mL IVPB     2 g 100 mL/hr over 30 Minutes Intravenous  Once 06/25/17 1156 06/25/17 1245   06/25/17 1200  vancomycin (VANCOCIN) IVPB 1000 mg/200 mL premix     1,000 mg 200 mL/hr over 60 Minutes Intravenous  Once 06/25/17 1156 06/25/17 1312       Objective   Vitals:   06/27/17 0500 06/27/17 0700 06/27/17 0733 06/27/17 0800  BP: (!) 101/33 (!) 87/32 (!) 99/52   Pulse:      Resp: 19 (!) 24  16   Temp:    97.8 F (36.6 C)  TempSrc:    Oral  SpO2: 93% 94% 95%   Weight:      Height:        Intake/Output Summary (Last 24 hours) at 06/27/2017 0953 Last data filed at 06/27/2017 0600 Gross per 24 hour  Intake 480 ml  Output -  Net 480 ml   Filed Weights   06/25/17 1102 06/26/17 0500  Weight: 71.2 kg (157 lb) 73 kg (160 lb 15 oz)     Physical Examination:  Physical Exam: Eyes: No icterus, extraocular muscles intact  Mouth: Oral mucosa is moist, no lesions on palate,  Neck: Supple, no deformities, masses, or tenderness Lungs: Normal respiratory effort, bilateral clear to  auscultation, no crackles or wheezes.  Heart: Regular rate and rhythm, S1 and S2 normal, no murmurs, rubs auscultated Abdomen: BS normoactive,soft,nondistended,non-tender to palpation,no organomegaly Extremities: No pretibial edema, no erythema, no cyanosis, no clubbing Neuro : Alert and oriented to time, place and person, No focal deficits Skin: No rashes seen on exam     Data Reviewed: I have personally reviewed following labs and imaging studies  CBG: Recent Labs  Lab 06/26/17 0739 06/26/17 1153 06/26/17 1613 06/26/17 2119 06/27/17 0749  GLUCAP 105* 234* 200* 280* 191*    CBC: Recent Labs  Lab 06/24/17 1117 06/25/17 1227 06/26/17 0326 06/27/17 0822  WBC 20.1 Repeated and verified X2.* 20.3* 19.0* 17.5*  NEUTROABS 16.7* 16.9*  --   --   HGB 13.9 12.7 11.8* 12.3  HCT 42.4 37.2 35.4* 36.7  MCV 100.6* 96.1 98.1 96.8  PLT 141.0* 108* 104* 128*    Basic Metabolic Panel: Recent Labs  Lab 06/24/17 1117 06/25/17 1227 06/26/17 0326 06/27/17 0822  NA 135 129* 133* 129*  K 5.5* 4.6 4.7 5.5*  CL 103 102 105 99*  CO2 25 23 24 24   GLUCOSE 234* 255* 145* 188*  BUN 19 26* 29* 43*  CREATININE 1.00 0.93 0.84 1.20*  CALCIUM 9.3 8.6* 8.8* 9.0    Recent Results (from the past 240 hour(s))  Blood Culture (routine x 2)     Status: None (Preliminary result)   Collection Time: 06/25/17 11:47 AM  Result Value Ref Range Status   Specimen Description BLOOD LEFT ANTECUBITAL  Final   Special Requests   Final    BOTTLES DRAWN AEROBIC AND ANAEROBIC Blood Culture adequate volume   Culture   Final    NO GROWTH < 24 HOURS Performed at [facility], [address], [place], [place] [address]    Report Status PENDING  Incomplete  Blood Culture (routine x 2)     Status: None (Preliminary result)   Collection Time: 06/25/17 11:55 AM  Result Value Ref Range Status   Specimen Description BLOOD BLOOD LEFT HAND  Final   Special Requests   Final    BOTTLES DRAWN AEROBIC AND  ANAEROBIC Blood Culture adequate volume   Culture   Final    NO GROWTH < 24 HOURS Performed at [facility], [place] [address], [place], [place] [address]    Report Status PENDING  Incomplete  MRSA PCR Screening     Status: None   Collection Time: 06/25/17  5:28 PM  Result Value Ref Range Status   MRSA by PCR NEGATIVE NEGATIVE Final    Comment:        The GeneXpert MRSA Assay (FDA approved for NASAL specimens only), is one component of a comprehensive MRSA  colonization surveillance program. It is not intended to diagnose MRSA infection nor to guide or monitor treatment for MRSA infections.      Liver Function Tests: Recent Labs  Lab 06/24/17 1117 06/25/17 1227 06/26/17 0326  AST 35 42* 29  ALT 27 27 25   ALKPHOS 137* 126 109  BILITOT 4.9* 4.4* 3.1*  PROT 5.9* 5.6* 4.9*  ALBUMIN 2.7* 2.4* 2.1*   No results for input(s): LIPASE, AMYLASE in the last 168 hours. No results for input(s): AMMONIA in the last 168 hours.  Cardiac Enzymes: No results for input(s): CKTOTAL, CKMB, CKMBINDEX, TROPONINI in the last 168 hours. BNP (last 3 results) No results for input(s): BNP in the last 8760 hours.  ProBNP (last 3 results) Recent Labs    05/26/17 1438  PROBNP 231.0*      Studies: Dg Chest Port 1 View  Result Date: 06/25/2017 CLINICAL DATA:  COPD. Shortness of breath for several weeks. Cough. Rule out pneumonia. EXAM: PORTABLE CHEST 1 VIEW COMPARISON:  05/22/2017 FINDINGS: Numerous leads and wires project over the chest. Midline trachea. Normal heart size. Atherosclerosis in the transverse aorta. The left costophrenic angle is minimally excluded. No pleural effusion or pneumothorax. Diffuse peribronchial thickening. No lobar consolidation. IMPRESSION: No evidence of pneumonia. Moderate interstitial thickening is similar, likely related to COPD/ chronic bronchitis. Electronically Signed   By: Abigail Miyamoto M.D.   On: 06/25/2017 11:51    Scheduled Meds: .  amoxicillin-clavulanate  1 tablet Oral Q12H  . enoxaparin (LOVENOX) injection  40 mg Subcutaneous Q24H  . guaiFENesin  600 mg Oral BID  . insulin aspart  0-9 Units Subcutaneous TID WC  . insulin detemir  14 Units Subcutaneous Q2200  . lactulose  10 g Oral Daily  . methylPREDNISolone (SOLU-MEDROL) injection  40 mg Intravenous Q6H  . pantoprazole  40 mg Oral Daily  . rifaximin  550 mg Oral BID  . sodium chloride flush  3 mL Intravenous Q12H  . tobramycin  2 drop Both Eyes Q6H      Time spent: 25 min  Oswald Hillock   [facility] Pager [phone]. If 7PM-7AM, please contact night-coverage at [email or web address], Office  [phone]  password [email or web address]  06/27/2017, 9:53 AM  LOS: 2 days

## 2017-06-28 LAB — BASIC METABOLIC PANEL
ANION GAP: 6 (ref 5–15)
BUN: 55 mg/dL — ABNORMAL HIGH (ref 6–20)
CALCIUM: 9.1 mg/dL (ref 8.9–10.3)
CO2: 24 mmol/L (ref 22–32)
Chloride: 102 mmol/L (ref 101–111)
Creatinine, Ser: 1.24 mg/dL — ABNORMAL HIGH (ref 0.44–1.00)
GFR, EST AFRICAN AMERICAN: 48 mL/min — AB (ref >60)
GFR, EST NON AFRICAN AMERICAN: 42 mL/min — AB (ref >60)
Glucose, Bld: 252 mg/dL — ABNORMAL HIGH (ref 65–99)
POTASSIUM: 5.7 mmol/L — AB (ref 3.5–5.1)
Sodium: 132 mmol/L — ABNORMAL LOW (ref 135–145)

## 2017-06-28 LAB — GLUCOSE, CAPILLARY
GLUCOSE-CAPILLARY: 235 mg/dL — AB (ref 65–99)
GLUCOSE-CAPILLARY: 361 mg/dL — AB (ref 65–99)
GLUCOSE-CAPILLARY: 388 mg/dL — AB (ref 65–99)
Glucose-Capillary: 368 mg/dL — ABNORMAL HIGH (ref 65–99)

## 2017-06-28 MED ORDER — FUROSEMIDE 20 MG PO TABS
60.0000 mg | ORAL_TABLET | Freq: Every day | ORAL | Status: DC
  Administered 2017-06-28 – 2017-06-30 (×3): 60 mg via ORAL
  Filled 2017-06-28 (×4): qty 1

## 2017-06-28 MED ORDER — IBUPROFEN 400 MG PO TABS
400.0000 mg | ORAL_TABLET | Freq: Four times a day (QID) | ORAL | Status: DC | PRN
  Administered 2017-06-28 – 2017-06-30 (×2): 400 mg via ORAL
  Filled 2017-06-28: qty 2
  Filled 2017-06-28: qty 1

## 2017-06-28 MED ORDER — SALINE SPRAY 0.65 % NA SOLN
1.0000 | NASAL | Status: DC | PRN
  Administered 2017-06-28: 1 via NASAL
  Filled 2017-06-28: qty 44

## 2017-06-28 MED ORDER — SODIUM POLYSTYRENE SULFONATE 15 GM/60ML PO SUSP
30.0000 g | Freq: Once | ORAL | Status: AC
  Administered 2017-06-28: 30 g via ORAL
  Filled 2017-06-28 (×2): qty 120

## 2017-06-28 NOTE — Progress Notes (Signed)
Inpatient Diabetes Program Recommendations  AACE/ADA: New Consensus Statement on Inpatient Glycemic Control (2015)  Target Ranges:  Prepandial:   less than 140 mg/dL      Peak postprandial:   less than 180 mg/dL (1-2 hours)      Critically ill patients:  140 - 180 mg/dL   Results for ANARELY, NICHOLLS" (MRN [id number]) as of 06/28/2017 13:37  Ref. Range 06/27/2017 07:49 06/27/2017 11:42 06/27/2017 16:05 06/27/2017 21:33  Glucose-Capillary Latest Ref Range: 65 - 99 mg/dL 191 (H) 252 (H) 279 (H) 310 (H)    Results for YAMNA, MACKEL" (MRN [id number]) as of 06/28/2017 13:37  Ref. Range 06/28/2017 07:45 06/28/2017 11:27  Glucose-Capillary Latest Ref Range: 65 - 99 mg/dL 235 (H) 361 (H)    Home DM Meds: Levemir 14 units QHS       Novolog 2-15 units TID per SSI  Current Insulin Orders: Levemir 14 units QHS      Novolog Sensitive Correction Scale/ SSI (0-9 units) TID AC       MD- Note patient getting Solumedrol 40 mg Q6 hours.  Please consider the following in-hospital insulin adjustments while patient getting steroids:  1. Increase Levemir to 18 units QHS (30% increase)  2. Start Novolog Meal Coverage: Novolog 6 units TID with meals (hold if pt eats <50% of meal)      --Will follow patient during hospitalization--  Wyn Quaker RN, MSN, CDE Diabetes Coordinator Inpatient Glycemic Control Team Team Pager: [phone] (8a-5p)

## 2017-06-28 NOTE — Progress Notes (Signed)
[facility]  PROGRESS NOTE  EATHER CHAIRES ZOX:[id number] DOB: [birth year] DOA: 06/25/2017 PCP: Mosie Lukes, MD   Brief HPI:    76 y.o. female,. with history of liver cirrhosis, NASH, diabetes mellitus, hyperlipidemia, COPD who came to ED for worsening shortness of breath, productive cough with greenish sputum. Patient says that symptoms have been going on for past few days. She went to her gastroneurologist yesterday who checked her labs and found that she had increased WBC. Patient denies chest pain. She denies nausea vomiting or diarrhea. Denies dysuria urgency or frequency of urination.  At [facility] [place] chest x-ray was done which did not show pneumonia. She was found to have white count of 20,000, lactic acidosis with lactic acid of 3.75. Patient basically started on vancomycin and cefepime.     Subjective   Patient seen and examined, breathing is improved, she is not back to baseline.   Assessment/Plan:     1. COPD exacerbation-significantly improved, continue Solu-Medrol 40 mg IV every 6 hours,  Augmentin 1 tablet p.o. twice daily, DuoNeb nebulizers every 6 hours, started Mucinex 1 tablet p.o. twice daily. 2. SIRS-patient presented with lactic acidosis, leukocytosis and was empirically started on vancomycin and cefepime.  Chest x-ray showed no pneumonia.  IV antibiotics were discontinued .  Started on Augmentin as above. 3. Diabetes mellitus-continue Levemir, sliding scale insulin with NovoLog. 4. Nash liver cirrhosis-continue lactulose, will hold Aldactone, Lasix 5. Acute conjunctivitis-continue Tobrex eyedrops  6. Hyperkalemia-potassium is still elevated at 5.7, will give another dose of Kayexalate 30 g p.o. x1.     DVT prophylaxis: Lovenox  Code Status: DNR  Family Communication: Discussed with patient  Disposition Plan: Likely home in 1-2 days  Consultants:  None  Procedures:  None  Continuous infusions . sodium chloride         Antibiotics:   Anti-infectives (From admission, onward)   Start     Dose/Rate Route Frequency Ordered Stop   06/26/17 1500  amoxicillin-clavulanate (AUGMENTIN) 875-125 MG per tablet 1 tablet     1 tablet Oral Every 12 hours 06/26/17 1416     06/25/17 2200  vancomycin (VANCOCIN) IVPB 750 mg/150 ml premix  Status:  Discontinued     750 mg 150 mL/hr over 60 Minutes Intravenous Every 12 hours 06/25/17 1342 06/25/17 1819   06/25/17 2200  rifaximin (XIFAXAN) tablet 550 mg     550 mg Oral 2 times daily 06/25/17 1819     06/25/17 2000  ceFEPIme (MAXIPIME) 1 g in dextrose 5 % 50 mL IVPB  Status:  Discontinued     1 g 100 mL/hr over 30 Minutes Intravenous Every 8 hours 06/25/17 1342 06/25/17 1819   06/25/17 1203  ceFEPIme (MAXIPIME) 2 g injection    Comments:  Simms, Marva   : cabinet override      06/25/17 1203 06/25/17 1211   06/25/17 1200  ceFEPIme (MAXIPIME) 2 g in dextrose 5 % 50 mL IVPB     2 g 100 mL/hr over 30 Minutes Intravenous  Once 06/25/17 1156 06/25/17 1245   06/25/17 1200  vancomycin (VANCOCIN) IVPB 1000 mg/200 mL premix     1,000 mg 200 mL/hr over 60 Minutes Intravenous  Once 06/25/17 1156 06/25/17 1312       Objective   Vitals:   06/28/17 0800 06/28/17 0900 06/28/17 1200 06/28/17 1400  BP: (!) 115/40 (!) 128/42  (!) 149/39  Pulse:      Resp: (!) 28 (!) 25 (!) 30 (!) 21  Temp: 98.4 F (36.9 C)  97.6 F (36.4 C)   TempSrc: Oral  Oral   SpO2: 93% (!) 88% (!) 88% 90%  Weight:      Height:        Intake/Output Summary (Last 24 hours) at 06/28/2017 1608 Last data filed at 06/28/2017 1200 Gross per 24 hour  Intake 480 ml  Output -  Net 480 ml   Filed Weights   06/25/17 1102 06/26/17 0500  Weight: 71.2 kg (157 lb) 73 kg (160 lb 15 oz)     Physical Examination:  Physical Exam: Eyes: No icterus, extraocular muscles intact  Mouth: Oral mucosa is moist, no lesions on palate,  Neck: Supple, no deformities, masses, or tenderness Lungs: Normal respiratory  effort, decreased breath sounds bilaterally, scattered wheezing Heart: Regular rate and rhythm, S1 and S2 normal, no murmurs, rubs auscultated Abdomen: BS normoactive,soft,nondistended,non-tender to palpation,no organomegaly Extremities: No pretibial edema, no erythema, no cyanosis, no clubbing Neuro : Alert and oriented to time, place and person, No focal deficits     Data Reviewed: I have personally reviewed following labs and imaging studies  CBG: Recent Labs  Lab 06/27/17 1605 06/27/17 2133 06/28/17 0745 06/28/17 1127 06/28/17 1545  GLUCAP 279* 310* 235* 361* 388*    CBC: Recent Labs  Lab 06/24/17 1117 06/25/17 1227 06/26/17 0326 06/27/17 0822  WBC 20.1 Repeated and verified X2.* 20.3* 19.0* 17.5*  NEUTROABS 16.7* 16.9*  --   --   HGB 13.9 12.7 11.8* 12.3  HCT 42.4 37.2 35.4* 36.7  MCV 100.6* 96.1 98.1 96.8  PLT 141.0* 108* 104* 128*    Basic Metabolic Panel: Recent Labs  Lab 06/24/17 1117 06/25/17 1227 06/26/17 0326 06/27/17 0822 06/28/17 0834  NA 135 129* 133* 129* 132*  K 5.5* 4.6 4.7 5.5* 5.7*  CL 103 102 105 99* 102  CO2 25 23 24 24 24   GLUCOSE 234* 255* 145* 188* 252*  BUN 19 26* 29* 43* 55*  CREATININE 1.00 0.93 0.84 1.20* 1.24*  CALCIUM 9.3 8.6* 8.8* 9.0 9.1    Recent Results (from the past 240 hour(s))  Blood Culture (routine x 2)     Status: None (Preliminary result)   Collection Time: 06/25/17 11:47 AM  Result Value Ref Range Status   Specimen Description BLOOD LEFT ANTECUBITAL  Final   Special Requests   Final    BOTTLES DRAWN AEROBIC AND ANAEROBIC Blood Culture adequate volume   Culture   Final    NO GROWTH 3 DAYS Performed at [facility], [address] [place], [place] [address]    Report Status PENDING  Incomplete  Blood Culture (routine x 2)     Status: None (Preliminary result)   Collection Time: 06/25/17 11:55 AM  Result Value Ref Range Status   Specimen Description BLOOD BLOOD LEFT HAND  Final   Special Requests    Final    BOTTLES DRAWN AEROBIC AND ANAEROBIC Blood Culture adequate volume   Culture   Final    NO GROWTH 3 DAYS Performed at [facility], [place] [address] [place], [place] [address]    Report Status PENDING  Incomplete  MRSA PCR Screening     Status: None   Collection Time: 06/25/17  5:28 PM  Result Value Ref Range Status   MRSA by PCR NEGATIVE NEGATIVE Final    Comment:        The GeneXpert MRSA Assay (FDA approved for NASAL specimens only), is one component of a comprehensive  MRSA colonization surveillance program. It is not intended to diagnose MRSA infection nor to guide or monitor treatment for MRSA infections.      Liver Function Tests: Recent Labs  Lab 06/24/17 1117 06/25/17 1227 06/26/17 0326  AST 35 42* 29  ALT 27 27 25   ALKPHOS 137* 126 109  BILITOT 4.9* 4.4* 3.1*  PROT 5.9* 5.6* 4.9*  ALBUMIN 2.7* 2.4* 2.1*   No results for input(s): LIPASE, AMYLASE in the last 168 hours. No results for input(s): AMMONIA in the last 168 hours.  Cardiac Enzymes: No results for input(s): CKTOTAL, CKMB, CKMBINDEX, TROPONINI in the last 168 hours. BNP (last 3 results) No results for input(s): BNP in the last 8760 hours.  ProBNP (last 3 results) Recent Labs    05/26/17 1438  PROBNP 231.0*      Studies: No results found.  Scheduled Meds: . amoxicillin-clavulanate  1 tablet Oral Q12H  . enoxaparin (LOVENOX) injection  40 mg Subcutaneous Q24H  . furosemide  60 mg Oral Daily  . guaiFENesin  600 mg Oral BID  . insulin aspart  0-9 Units Subcutaneous TID WC  . insulin detemir  14 Units Subcutaneous Q2200  . lactulose  30 g Oral Daily  . methylPREDNISolone (SOLU-MEDROL) injection  40 mg Intravenous Q6H  . pantoprazole  40 mg Oral Daily  . rifaximin  550 mg Oral BID  . sodium chloride flush  3 mL Intravenous Q12H  . tobramycin  2 drop Both Eyes Q6H      Time spent: 25 min  Oswald Hillock   [facility] Pager [phone]. If 7PM-7AM, please contact  night-coverage at [email or web address], Office  [phone]  password [email or web address]  06/28/2017, 4:08 PM  LOS: 3 days

## 2017-06-28 NOTE — Consult Note (Signed)
   [facility] Inpatient Consult   06/28/2017  Ashley Savage 1940-09-01 [id number]    [name] is active with [place] Management program.   Spoke with inpatient RNCM to make aware [place] Management is following.   Patient is currently in stepdown unit.   Will continue to follow and update [facility] team.    [name], [place], RN,BSN [facility] Liaison [phone]

## 2017-06-28 NOTE — Care Management Note (Signed)
Case Management Note  Patient Details  Name: Ashley Savage MRN: [id number] Date of Birth: 1941-01-24  Subjective/Objective:                  sepsis  Action/Plan: Date: June 28, 2017 [name], BSN, [place], [place] Chart and notes review for patient progress and needs. Will follow for case management and discharge needs. Next review date: [id number]  Expected Discharge Date:                  Expected Discharge Plan:  Home/Self Care  In-House Referral:     Discharge planning Services  CM Consult  Post Acute Care Choice:    Choice offered to:     DME Arranged:    DME Agency:     HH Arranged:    HH Agency:     Status of Service:  In process, will continue to follow  If discussed at Long Length of Stay Meetings, dates discussed:    Additional Comments:  [name], RN 06/28/2017, 8:59 AM

## 2017-06-29 DIAGNOSIS — Z794 Long term (current) use of insulin: Secondary | ICD-10-CM

## 2017-06-29 DIAGNOSIS — E119 Type 2 diabetes mellitus without complications: Secondary | ICD-10-CM

## 2017-06-29 LAB — GLUCOSE, CAPILLARY
GLUCOSE-CAPILLARY: 433 mg/dL — AB (ref 65–99)
GLUCOSE-CAPILLARY: 402 mg/dL — AB (ref 65–99)
GLUCOSE-CAPILLARY: 329 mg/dL — AB (ref 65–99)
GLUCOSE-CAPILLARY: 324 mg/dL — AB (ref 65–99)
Glucose-Capillary: 401 mg/dL — ABNORMAL HIGH (ref 65–99)
Glucose-Capillary: 312 mg/dL — ABNORMAL HIGH (ref 65–99)
Glucose-Capillary: 284 mg/dL — ABNORMAL HIGH (ref 65–99)

## 2017-06-29 LAB — CBC
HCT: 34.8 % — ABNORMAL LOW (ref 36.0–46.0)
HEMOGLOBIN: 11.9 g/dL — AB (ref 12.0–15.0)
MCH: 33.3 pg (ref 26.0–34.0)
MCHC: 34.2 g/dL (ref 30.0–36.0)
MCV: 97.5 fL (ref 78.0–100.0)
Platelets: 117 10*3/uL — ABNORMAL LOW (ref 150–400)
RBC: 3.57 MIL/uL — AB (ref 3.87–5.11)
RDW: 15.2 % (ref 11.5–15.5)
WBC: 13.8 10*3/uL — AB (ref 4.0–10.5)

## 2017-06-29 LAB — BASIC METABOLIC PANEL
ANION GAP: 5 (ref 5–15)
BUN: 57 mg/dL — ABNORMAL HIGH (ref 6–20)
CALCIUM: 8.7 mg/dL — AB (ref 8.9–10.3)
CO2: 27 mmol/L (ref 22–32)
Chloride: 102 mmol/L (ref 101–111)
Creatinine, Ser: 1.09 mg/dL — ABNORMAL HIGH (ref 0.44–1.00)
GFR, EST AFRICAN AMERICAN: 57 mL/min — AB (ref >60)
GFR, EST NON AFRICAN AMERICAN: 49 mL/min — AB (ref >60)
Glucose, Bld: 467 mg/dL — ABNORMAL HIGH (ref 65–99)
Potassium: 5.2 mmol/L — ABNORMAL HIGH (ref 3.5–5.1)
Sodium: 134 mmol/L — ABNORMAL LOW (ref 135–145)

## 2017-06-29 MED ORDER — PREDNISONE 20 MG PO TABS
40.0000 mg | ORAL_TABLET | Freq: Every day | ORAL | Status: DC
  Administered 2017-06-29 – 2017-06-30 (×2): 40 mg via ORAL
  Filled 2017-06-29 (×2): qty 2

## 2017-06-29 MED ORDER — INSULIN ASPART 100 UNIT/ML ~~LOC~~ SOLN
10.0000 [IU] | Freq: Once | SUBCUTANEOUS | Status: AC
  Administered 2017-06-29: 10 [IU] via SUBCUTANEOUS

## 2017-06-29 MED ORDER — INSULIN DETEMIR 100 UNIT/ML ~~LOC~~ SOLN
17.0000 [IU] | Freq: Every day | SUBCUTANEOUS | Status: DC
  Administered 2017-06-29: 22:00:00 17 [IU] via SUBCUTANEOUS
  Filled 2017-06-29 (×2): qty 0.17

## 2017-06-29 NOTE — Progress Notes (Signed)
[facility]  PROGRESS NOTE  Ashley Savage ZSW:[id number] DOB: [birth year] DOA: 06/25/2017 PCP: Mosie Lukes, MD   Brief HPI:    76 y.o. female,. with history of liver cirrhosis, NASH, diabetes mellitus, hyperlipidemia, COPD who came to ED for worsening shortness of breath, productive cough with greenish sputum. Patient says that symptoms have been going on for past few days. She went to her gastroneurologist yesterday who checked her labs and found that she had increased WBC. Patient denies chest pain. She denies nausea vomiting or diarrhea. Denies dysuria urgency or frequency of urination.  At [facility] [place] chest x-ray was done which did not show pneumonia. She was found to have white count of 20,000, lactic acidosis with lactic acid of 3.75. Patient basically started on vancomycin and cefepime.     Subjective   Patient seen and examined, wheezing is improved.  Blood glucose was elevated so her Levemir dose changed to 17 units subcu daily.   Assessment/Plan:     1. COPD exacerbation-significantly improved, will discontinue Solu-Medrol , start prednisone 40 mg p.o. Daily, continue Augmentin 1 tablet p.o. twice daily, DuoNeb nebulizers every 6 hours, started Mucinex 1 tablet p.o. twice daily. 2. SIRS-patient presented with lactic acidosis, leukocytosis and was empirically started on vancomycin and cefepime.  Chest x-ray showed no pneumonia.  IV antibiotics were discontinued .  WBC is 13,000 today, started on Augmentin as above. 3. Diabetes mellitus-blood glucose is elevated, Solu-Medrol discontinued.  Dose of Levemir changed to 17 units subcu daily, sliding scale insulin with NovoLog. 4. Nash liver cirrhosis-continue lactulose, will hold Aldactone, Lasix 5. Acute conjunctivitis-continue Tobrex eyedrops  6. Hyperkalemia-potassium is still elevated at 5.2, will give another dose of Kayexalate 15 g p.o. x1   DVT prophylaxis: Lovenox  Code Status: DNR  Family  Communication: Discussed with patient  Disposition Plan: Likely home in 1-2 days  Consultants:  None  Procedures:  None  Continuous infusions . sodium chloride        Antibiotics:   Anti-infectives (From admission, onward)   Start     Dose/Rate Route Frequency Ordered Stop   06/26/17 1500  amoxicillin-clavulanate (AUGMENTIN) 875-125 MG per tablet 1 tablet     1 tablet Oral Every 12 hours 06/26/17 1416     06/25/17 2200  vancomycin (VANCOCIN) IVPB 750 mg/150 ml premix  Status:  Discontinued     750 mg 150 mL/hr over 60 Minutes Intravenous Every 12 hours 06/25/17 1342 06/25/17 1819   06/25/17 2200  rifaximin (XIFAXAN) tablet 550 mg     550 mg Oral 2 times daily 06/25/17 1819     06/25/17 2000  ceFEPIme (MAXIPIME) 1 g in dextrose 5 % 50 mL IVPB  Status:  Discontinued     1 g 100 mL/hr over 30 Minutes Intravenous Every 8 hours 06/25/17 1342 06/25/17 1819   06/25/17 1203  ceFEPIme (MAXIPIME) 2 g injection    Comments:  Simms, Marva   : cabinet override      06/25/17 1203 06/25/17 1211   06/25/17 1200  ceFEPIme (MAXIPIME) 2 g in dextrose 5 % 50 mL IVPB     2 g 100 mL/hr over 30 Minutes Intravenous  Once 06/25/17 1156 06/25/17 1245   06/25/17 1200  vancomycin (VANCOCIN) IVPB 1000 mg/200 mL premix     1,000 mg 200 mL/hr over 60 Minutes Intravenous  Once 06/25/17 1156 06/25/17 1312       Objective   Vitals:   06/29/17 0700 06/29/17 0800 06/29/17 1000  06/29/17 1100  BP: 107/62  (!) 135/48 140/73  Pulse:      Resp: (!) 29 (!) 28 20 20   Temp:  (!) 97.5 F (36.4 C)    TempSrc:  Oral    SpO2: 92% 96% 99% 96%  Weight:      Height:        Intake/Output Summary (Last 24 hours) at 06/29/2017 1129 Last data filed at 06/29/2017 0800 Gross per 24 hour  Intake 720 ml  Output -  Net 720 ml   Filed Weights   06/25/17 1102 06/26/17 0500  Weight: 71.2 kg (157 lb) 73 kg (160 lb 15 oz)     Physical Examination:  Physical Exam: Eyes: No icterus, extraocular muscles intact   Mouth: Oral mucosa is moist, no lesions on palate,  Neck: Supple, no deformities, masses, or tenderness Lungs: Normal respiratory effort, bilateral clear to auscultation, no crackles or wheezes.  Heart: Regular rate and rhythm, S1 and S2 normal, no murmurs, rubs auscultated Abdomen: BS normoactive,soft,nondistended,non-tender to palpation,no organomegaly Extremities: No pretibial edema, no erythema, no cyanosis, no clubbing Neuro : Alert and oriented to time, place and person, No focal deficits Skin: No rashes seen on exam     Data Reviewed: I have personally reviewed following labs and imaging studies  CBG: Recent Labs  Lab 06/28/17 2110 06/29/17 0241 06/29/17 0508 06/29/17 0636 06/29/17 0741  GLUCAP 368* 433* 402* 401* 329*    CBC: Recent Labs  Lab 06/24/17 1117 06/25/17 1227 06/26/17 0326 06/27/17 0822 06/29/17 0305  WBC 20.1 Repeated and verified X2.* 20.3* 19.0* 17.5* 13.8*  NEUTROABS 16.7* 16.9*  --   --   --   HGB 13.9 12.7 11.8* 12.3 11.9*  HCT 42.4 37.2 35.4* 36.7 34.8*  MCV 100.6* 96.1 98.1 96.8 97.5  PLT 141.0* 108* 104* 128* 117*    Basic Metabolic Panel: Recent Labs  Lab 06/25/17 1227 06/26/17 0326 06/27/17 0822 06/28/17 0834 06/29/17 0305  NA 129* 133* 129* 132* 134*  K 4.6 4.7 5.5* 5.7* 5.2*  CL 102 105 99* 102 102  CO2 23 24 24 24 27   GLUCOSE 255* 145* 188* 252* 467*  BUN 26* 29* 43* 55* 57*  CREATININE 0.93 0.84 1.20* 1.24* 1.09*  CALCIUM 8.6* 8.8* 9.0 9.1 8.7*    Recent Results (from the past 240 hour(s))  Blood Culture (routine x 2)     Status: None (Preliminary result)   Collection Time: 06/25/17 11:47 AM  Result Value Ref Range Status   Specimen Description BLOOD LEFT ANTECUBITAL  Final   Special Requests   Final    BOTTLES DRAWN AEROBIC AND ANAEROBIC Blood Culture adequate volume   Culture   Final    NO GROWTH 3 DAYS Performed at [facility], [address], [place], [place] [address]    Report Status PENDING   Incomplete  Blood Culture (routine x 2)     Status: None (Preliminary result)   Collection Time: 06/25/17 11:55 AM  Result Value Ref Range Status   Specimen Description BLOOD BLOOD LEFT HAND  Final   Special Requests   Final    BOTTLES DRAWN AEROBIC AND ANAEROBIC Blood Culture adequate volume   Culture   Final    NO GROWTH 3 DAYS Performed at [facility], [place] [address], [place], [place] [address]    Report Status PENDING  Incomplete  MRSA PCR Screening     Status: None   Collection Time: 06/25/17  5:28 PM  Result  Value Ref Range Status   MRSA by PCR NEGATIVE NEGATIVE Final    Comment:        The GeneXpert MRSA Assay (FDA approved for NASAL specimens only), is one component of a comprehensive MRSA colonization surveillance program. It is not intended to diagnose MRSA infection nor to guide or monitor treatment for MRSA infections.      Liver Function Tests: Recent Labs  Lab 06/24/17 1117 06/25/17 1227 06/26/17 0326  AST 35 42* 29  ALT 27 27 25   ALKPHOS 137* 126 109  BILITOT 4.9* 4.4* 3.1*  PROT 5.9* 5.6* 4.9*  ALBUMIN 2.7* 2.4* 2.1*   No results for input(s): LIPASE, AMYLASE in the last 168 hours. No results for input(s): AMMONIA in the last 168 hours.  Cardiac Enzymes: No results for input(s): CKTOTAL, CKMB, CKMBINDEX, TROPONINI in the last 168 hours. BNP (last 3 results) No results for input(s): BNP in the last 8760 hours.  ProBNP (last 3 results) Recent Labs    05/26/17 1438  PROBNP 231.0*      Studies: No results found.  Scheduled Meds: . amoxicillin-clavulanate  1 tablet Oral Q12H  . enoxaparin (LOVENOX) injection  40 mg Subcutaneous Q24H  . furosemide  60 mg Oral Daily  . guaiFENesin  600 mg Oral BID  . insulin aspart  0-9 Units Subcutaneous TID WC  . insulin detemir  17 Units Subcutaneous Q2200  . lactulose  30 g Oral Daily  . pantoprazole  40 mg Oral Daily  . predniSONE  40 mg Oral Q breakfast  . rifaximin  550 mg Oral BID  .  sodium chloride flush  3 mL Intravenous Q12H  . tobramycin  2 drop Both Eyes Q6H      Time spent: 25 min  Oswald Hillock   [facility] Pager [phone]. If 7PM-7AM, please contact night-coverage at [email or web address], Office  [phone]  password [email or web address]  06/29/2017, 11:29 AM  LOS: 4 days

## 2017-06-29 NOTE — Evaluation (Signed)
Physical Therapy Evaluation Patient Details Name: Ashley Savage MRN: [id number] DOB: [birth year] Today's Date: 06/29/2017   History of Present Illness  76 year old female was admitted for COPD exacerbation. ON 06/25/17. PMH:  DM, cirrhosis, breast CA, and chronic leg edema  Clinical Impression  The patient ambul;ated with RW, desaturation with 0-1 liters of oxygen, required 3 iters to stay > 90%. Pt admitted with above diagnosis. Pt currently with functional limitations due to the deficits listed below (see PT Problem List).  Pt will benefit from skilled PT to increase their independence and safety with mobility to allow discharge to the venue listed below.       Follow Up Recommendations Home health PT- patient declines SNF    Equipment Recommendations  None recommended by PT    Recommendations for Other Services       Precautions / Restrictions Precautions Precaution Comments: MONITOR O2, NEEDS o2      Mobility  Bed Mobility               General bed mobility comments: in recliner  Transfers Overall transfer level: Needs assistance Equipment used: Rolling walker (2 wheeled) Transfers: Sit to/from Stand Sit to Stand: Supervision         General transfer comment: cues for safety with all the lines  Ambulation/Gait Ambulation/Gait assistance: Min guard Ambulation Distance (Feet): 70 Feet Assistive device: Rolling walker (2 wheeled) Gait Pattern/deviations: Step-through pattern     General Gait Details: slow speed, turns around, PT manages lines for safety. Oxygen sats dropped to 83% with short trial of 0 then 1  lliter. Required   increase to 3 liters to stay > 90%  Stairs            Wheelchair Mobility    Modified Rankin (Stroke Patients Only)       Balance                                             Pertinent Vitals/Pain Pain Assessment: No/denies pain    Home Living Family/patient expects to be discharged to:: Private  residence Living Arrangements: Alone Available Help at Discharge: Friend(s) Type of Home: House Home Access: Level entry     Home Layout: One level Home Equipment: Shower seat;Cane - single point;Walker - 2 wheels;Walker - 4 wheels Additional Comments: counter next to toilet    Prior Function Level of Independence: Independent with assistive device(s)         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, DRIVES FRIENDS TO STORE, THEY ASSIST WITH GROCERIES     Hand Dominance        Extremity/Trunk Assessment   Upper Extremity Assessment Upper Extremity Assessment: Generalized weakness    Lower Extremity Assessment Lower Extremity Assessment: Generalized weakness    Cervical / Trunk Assessment Cervical / Trunk Assessment: Kyphotic;Other exceptions Cervical / Trunk Exceptions: sits with head forward  Communication   Communication: No difficulties  Cognition Arousal/Alertness: Awake/alert Behavior During Therapy: WFL for tasks assessed/performed Overall Cognitive Status: Within Functional Limits for tasks assessed                                        General Comments      Exercises     Assessment/Plan    PT Assessment Patient needs continued PT  services  PT Problem List Decreased strength;Decreased activity tolerance;Decreased mobility;Cardiopulmonary status limiting activity       PT Treatment Interventions DME instruction;Gait training;Functional mobility training;Therapeutic activities;Patient/family education    PT Goals (Current goals can be found in the Care Plan section)  Acute Rehab PT Goals Patient Stated Goal: to go home, buy groceries before the snow. PT Goal Formulation: With patient Time For Goal Achievement: 07/13/17 Potential to Achieve Goals: Good    Frequency Min 2X/week   Barriers to discharge Decreased caregiver support      Co-evaluation               AM-PAC PT "6 Clicks" Daily Activity  Outcome Measure Difficulty turning  over in bed (including adjusting bedclothes, sheets and blankets)?: A Little Difficulty moving from lying on back to sitting on the side of the bed? : A Little Difficulty sitting down on and standing up from a chair with arms (e.g., wheelchair, bedside commode, etc,.)?: A Little Help needed moving to and from a bed to chair (including a wheelchair)?: A Little Help needed walking in hospital room?: A Little Help needed climbing 3-5 steps with a railing? : A Little 6 Click Score: 18    End of Session Equipment Utilized During Treatment: Oxygen Activity Tolerance: Patient tolerated treatment well Patient left: in chair;with call bell/phone within reach;with chair alarm set Nurse Communication: Mobility status PT Visit Diagnosis: Difficulty in walking, not elsewhere classified (R26.2)    Time: 4917-9150 PT Time Calculation (min) (ACUTE ONLY): 37 min   Charges:   PT Evaluation $PT Eval Low Complexity: 1 Low PT Treatments $Gait Training: 8-22 mins $Self Care/Home Management: 8-22   PT G CodesTresa Endo PT [phone]   Claretha Cooper 06/29/2017, 1:51 PM

## 2017-06-29 NOTE — Progress Notes (Signed)
SATURATION QUALIFICATIONS: (This note is used to comply with regulatory documentation for home oxygen)  Patient Saturations on Room Air at Rest = 87%  Patient Saturations on Room Air while Ambulating = 83%  Patient Saturations on 2 Liters of oxygen while Ambulating = 87%  Please briefly explain why patient needs home oxygen: PATIENT DESATURATES WITHOUT OXYGEN AND ON 2 LITERS.  Tresa Endo PT [phone]

## 2017-06-30 LAB — CULTURE, BLOOD (ROUTINE X 2)
CULTURE: NO GROWTH
CULTURE: NO GROWTH
SPECIAL REQUESTS: ADEQUATE
SPECIAL REQUESTS: ADEQUATE

## 2017-06-30 LAB — BASIC METABOLIC PANEL
ANION GAP: 4 — AB (ref 5–15)
BUN: 45 mg/dL — ABNORMAL HIGH (ref 6–20)
CALCIUM: 8.7 mg/dL — AB (ref 8.9–10.3)
CO2: 29 mmol/L (ref 22–32)
CREATININE: 1 mg/dL (ref 0.44–1.00)
Chloride: 101 mmol/L (ref 101–111)
GFR calc Af Amer: >60 mL/min (ref >60)
GFR calc non Af Amer: 54 mL/min — ABNORMAL LOW (ref >60)
GLUCOSE: 240 mg/dL — AB (ref 65–99)
Potassium: 4.5 mmol/L (ref 3.5–5.1)
Sodium: 134 mmol/L — ABNORMAL LOW (ref 135–145)

## 2017-06-30 LAB — GLUCOSE, CAPILLARY
Glucose-Capillary: 295 mg/dL — ABNORMAL HIGH (ref 65–99)
Glucose-Capillary: 224 mg/dL — ABNORMAL HIGH (ref 65–99)

## 2017-06-30 MED ORDER — INSULIN DETEMIR 100 UNIT/ML ~~LOC~~ SOLN: 17.0000 [IU] | Freq: Every day | SUBCUTANEOUS | 11 refills | Status: DC

## 2017-06-30 MED ORDER — AMOXICILLIN-POT CLAVULANATE 875-125 MG PO TABS: 1.0000 | ORAL_TABLET | Freq: Two times a day (BID) | ORAL | 0 refills | Status: DC

## 2017-06-30 MED ORDER — ALBUTEROL SULFATE 1.25 MG/3ML IN NEBU: 1.0000 | INHALATION_SOLUTION | Freq: Four times a day (QID) | RESPIRATORY_TRACT | 12 refills | Status: DC | PRN

## 2017-06-30 MED ORDER — PREDNISONE 10 MG PO TABS: ORAL_TABLET | ORAL | 0 refills | Status: DC

## 2017-06-30 NOTE — Progress Notes (Signed)
Pt ready for discharge to home with continuous oxygen. Tank and oxygen maker at bedside. Reviewed all discharge instructions and follow up appointments as well as prescriptions and home medications.

## 2017-06-30 NOTE — Care Management Important Message (Signed)
Important Message  Patient Details  Name: SHONDREA STEINERT MRN: [id number] Date of Birth: 06-Jul-1941   Medicare Important Message Given:  Yes    Kerin Salen 06/30/2017, 9:56 AMImportant Message  Patient Details  Name: SAMERA MACY MRN: [id number] Date of Birth: 05-03-1941   Medicare Important Message Given:  Yes    Kerin Salen 06/30/2017, 9:55 AM

## 2017-06-30 NOTE — Discharge Summary (Signed)
Physician Discharge Summary  Ashley Savage ENI:[id number] DOB: [birth year] DOA: 06/25/2017  PCP: Mosie Lukes, MD  Admit date: 06/25/2017 Discharge date: 06/30/2017  Time spent: *35 minutes  Recommendations for Outpatient Follow-up:  1. Follow up PCP in 2 weeks   Discharge Diagnoses:  Active Problems:   COPD exacerbation [facility])   Discharge Condition: Stable  Diet recommendation: carb modified diet  Filed Weights   06/25/17 1102 06/26/17 0500  Weight: 71.2 kg (157 lb) 73 kg (160 lb 15 oz)    History of present illness:  76 y.o.female,.with history of liver cirrhosis, NASH,diabetes mellitus, hyperlipidemia,COPD who came to ED for worsening shortness of breath, productive cough with greenish sputum. Patient says that symptoms have been going on for past few days. She went to her gastroneurologist yesterday who checked her labs and found that she had increased WBC. Patient denies chest pain. She denies nausea vomiting or diarrhea. Denies dysuria urgency or frequency of urination.  At [facility] [place] chest x-ray was done which did not show pneumonia. She was found to have white count of 20,000, lactic acidosis with lactic acid of 3.75. Patient basically started on vancomycin and cefepime    Hospital Course:  1. COPD exacerbation-significantly improved, patient was started on Solu-Medrol which has been discontinued , started prednisone 40 mg p.o.  taper, continue Augmentin 1 tablet p.o. twice daily for 5 days. 2. SIRS- resolved, patient presented with lactic acidosis, leukocytosis and was empirically started on vancomycin and cefepime.  Chest x-ray showed no pneumonia.  IV antibiotics were discontinued .  WBC is 13,000 today, started on Augmentin as above. 3. Diabetes mellitus-blood glucose is elevated, Solu-Medrol discontinued.  Dose of Levemir changed to 17 units subcu daily 4. [place] liver cirrhosis-continue lactulose, Aldactone, Lasix 5. Acute conjunctivitis-continue  Tobrex eyedrops  6. Hyperkalemia- resolved, potassium is 4.5 today.     Procedures:  None   Consultations:  None   Discharge Exam: Vitals:   06/30/17 0309 06/30/17 0455  BP:  (!) 120/54  Pulse:  84  Resp:  20  Temp:  (!) 97.5 F (36.4 C)  SpO2: 94% 94%    General: Appears in no acute distress Cardiovascular: S1s2 RRR Respiratory: Clear bilaterally  Discharge Instructions   Discharge Instructions    Diet - low sodium heart healthy   Complete by:  As directed    Increase activity slowly   Complete by:  As directed      Allergies as of 06/30/2017      Reactions   Citalopram Palpitations   Irregular heart beat   Ciprofloxacin    Mental status change   Erythromycin Other (See Comments)   Stomach cramps   Glimepiride    Elevated ammonia levels   Prednisone    Increased blood sugars too high      Medication List    STOP taking these medications   LEVEMIR FLEXTOUCH 100 UNIT/ML Pen Generic drug:  Insulin Detemir Replaced by:  insulin detemir 100 UNIT/ML injection     TAKE these medications   albuterol 1.25 MG/3ML nebulizer solution Commonly known as:  ACCUNEB Take 3 mLs (1.25 mg total) by nebulization every 6 (six) hours as needed for wheezing. What changed:    how much to take  how to take this  when to take this  reasons to take this   amoxicillin-clavulanate 875-125 MG tablet Commonly known as:  AUGMENTIN Take 1 tablet by mouth every 12 (twelve) hours.   furosemide 40 MG tablet Commonly known  as:  LASIX Take 1.5 tablets (60 mg total) by mouth daily. What changed:  how much to take   guaiFENesin 600 MG 12 hr tablet Commonly known as:  MUCINEX Take 2 tablets (1,200 mg total) by mouth 2 (two) times daily as needed for to loosen phlegm.   insulin detemir 100 UNIT/ML injection Commonly known as:  LEVEMIR Inject 0.17 mLs (17 Units total) into the skin daily at 10 pm. Replaces:  LEVEMIR FLEXTOUCH 100 UNIT/ML Pen   lactulose 10 GM/15ML  solution Commonly known as:  CHRONULAC TAKE 45 ML BY MOUTH TWICE DAILY AS NEEDED FOR MILD CONSTIPATION   mometasone 50 MCG/ACT nasal spray Commonly known as:  NASONEX Place 2 sprays into the nose daily.   NOVOLOG FLEXPEN 100 UNIT/ML FlexPen Generic drug:  insulin aspart Inject into the skin. Sliding scale 121-150 - 2 units; 151-200 - 3 units; 201-250 -5 units; 251-300 - 8 units; 301-350 - 11units; 351-400 - 15 unitsc351-400 -15 units; greater than 400 -15 units and call MD.   omeprazole 20 MG capsule Commonly known as:  PRILOSEC TAKE 1 CAPSULE(20 MG) BY MOUTH DAILY   polyvinyl alcohol 1.4 % ophthalmic solution Commonly known as:  LUBRICANT DROPS Place 1 drop into both eyes as needed (dry eyes).   predniSONE 10 MG tablet Commonly known as:  DELTASONE Prednisone 40 mg po daily x 2 day then Prednisone 30 mg po daily x 2 day then Prednisone 20 mg po daily x 2 day then Prednisone 10 mg daily x 2 day then stop...   rifaximin 550 MG Tabs tablet Commonly known as:  XIFAXAN Take 1 tablet (550 mg total) by mouth 2 (two) times daily.   spironolactone 100 MG tablet Commonly known as:  ALDACTONE Take 1.5 tablets (150 mg total) by mouth daily. What changed:  how much to take   Tiotropium Bromide-Olodaterol 2.5-2.5 MCG/ACT Aers Commonly known as:  STIOLTO RESPIMAT Inhale 2 puffs into the lungs daily.   Vitamin D3 2000 units Tabs Take 2,000 Units by mouth every morning.            Durable Medical Equipment  (From admission, onward)        Start     Ordered   06/30/17 1212  For home use only DME oxygen  Once    Question Answer Comment  Mode or (Route) Nasal cannula   Liters per Minute 2   Frequency Continuous (stationary and portable oxygen unit needed)   Oxygen delivery system Gas      06/30/17 1212     Allergies  Allergen Reactions  . Citalopram Palpitations    Irregular heart beat  . Ciprofloxacin     Mental status change  . Erythromycin Other (See Comments)     Stomach cramps  . Glimepiride     Elevated ammonia levels  . Prednisone     Increased blood sugars too high   Follow-up Information    [facility].   Specialty:  [place] Why:  physical therapy and oxygen Contact information: [address] [place] [address] [phone]            The results of significant diagnostics from this hospitalization (including imaging, microbiology, ancillary and laboratory) are listed below for reference.    Significant Diagnostic Studies: Dg Chest Port 1 View  Result Date: 06/25/2017 CLINICAL DATA:  COPD. Shortness of breath for several weeks. Cough. Rule out pneumonia. EXAM: PORTABLE CHEST 1 VIEW COMPARISON:  05/22/2017 FINDINGS:  Numerous leads and wires project over the chest. Midline trachea. Normal heart size. Atherosclerosis in the transverse aorta. The left costophrenic angle is minimally excluded. No pleural effusion or pneumothorax. Diffuse peribronchial thickening. No lobar consolidation. IMPRESSION: No evidence of pneumonia. Moderate interstitial thickening is similar, likely related to COPD/ chronic bronchitis. Electronically Signed   By: Abigail Miyamoto M.D.   On: 06/25/2017 11:51    Microbiology: Recent Results (from the past 240 hour(s))  Blood Culture (routine x 2)     Status: None (Preliminary result)   Collection Time: 06/25/17 11:47 AM  Result Value Ref Range Status   Specimen Description BLOOD LEFT ANTECUBITAL  Final   Special Requests   Final    BOTTLES DRAWN AEROBIC AND ANAEROBIC Blood Culture adequate volume   Culture   Final    NO GROWTH 4 DAYS Performed at [facility], [address], [place], [place] [address]    Report Status PENDING  Incomplete  Blood Culture (routine x 2)     Status: None (Preliminary result)   Collection Time: 06/25/17 11:55 AM  Result Value Ref Range Status   Specimen Description BLOOD BLOOD LEFT HAND  Final   Special Requests   Final     BOTTLES DRAWN AEROBIC AND ANAEROBIC Blood Culture adequate volume   Culture   Final    NO GROWTH 4 DAYS Performed at [facility], [place] [address] [place], [place] [address]    Report Status PENDING  Incomplete  MRSA PCR Screening     Status: None   Collection Time: 06/25/17  5:28 PM  Result Value Ref Range Status   MRSA by PCR NEGATIVE NEGATIVE Final    Comment:        The GeneXpert MRSA Assay (FDA approved for NASAL specimens only), is one component of a comprehensive MRSA colonization surveillance program. It is not intended to diagnose MRSA infection nor to guide or monitor treatment for MRSA infections.      Labs: Basic Metabolic Panel: Recent Labs  Lab 06/26/17 0326 06/27/17 0822 06/28/17 0834 06/29/17 0305 06/30/17 1038  NA 133* 129* 132* 134* 134*  K 4.7 5.5* 5.7* 5.2* 4.5  CL 105 99* 102 102 101  CO2 24 24 24 27 29   GLUCOSE 145* 188* 252* 467* 240*  BUN 29* 43* 55* 57* 45*  CREATININE 0.84 1.20* 1.24* 1.09* 1.00  CALCIUM 8.8* 9.0 9.1 8.7* 8.7*   Liver Function Tests: Recent Labs  Lab 06/24/17 1117 06/25/17 1227 06/26/17 0326  AST 35 42* 29  ALT 27 27 25   ALKPHOS 137* 126 109  BILITOT 4.9* 4.4* 3.1*  PROT 5.9* 5.6* 4.9*  ALBUMIN 2.7* 2.4* 2.1*   No results for input(s): LIPASE, AMYLASE in the last 168 hours. No results for input(s): AMMONIA in the last 168 hours. CBC: Recent Labs  Lab 06/24/17 1117 06/25/17 1227 06/26/17 0326 06/27/17 0822 06/29/17 0305  WBC 20.1 Repeated and verified X2.* 20.3* 19.0* 17.5* 13.8*  NEUTROABS 16.7* 16.9*  --   --   --   HGB 13.9 12.7 11.8* 12.3 11.9*  HCT 42.4 37.2 35.4* 36.7 34.8*  MCV 100.6* 96.1 98.1 96.8 97.5  PLT 141.0* 108* 104* 128* 117*   Cardiac Enzymes: No results for input(s): CKTOTAL, CKMB, CKMBINDEX, TROPONINI in the last 168 hours. BNP: BNP (last 3 results) No results for input(s): BNP in the last 8760 hours.  ProBNP (last 3 results) Recent Labs    05/26/17 1438  PROBNP  231.0*  CBG: Recent Labs  Lab 06/29/17 1205 06/29/17 1630 06/29/17 2142 06/30/17 0803 06/30/17 1144  GLUCAP 312* 324* 284* 295* 224*       Signed:  Oswald Hillock MD.  [facility] 06/30/2017, 1:17 PM

## 2017-06-30 NOTE — Progress Notes (Signed)
Physical Therapy Treatment Patient Details Name: Ashley Savage MRN: [id number] DOB: 1941/02/11 Today's Date: 06/30/2017    History of Present Illness 76 year old female was admitted for COPD exacerbation. ON 06/25/17. PMH:  DM, cirrhosis, breast CA, and chronic leg edema    PT Comments    Patient on 2L O2 [place], O2 dropped to 86% while ambulated. Instructed patient on pursed lip breathing, O2 raised above 88%. Patient ambulated with RW 50 feet, requiring min guard for safety and for management of lines. Positioned in recliner and pillows for pressure relief.    Follow Up Recommendations  Home health PT     Equipment Recommendations  None recommended by PT    Recommendations for Other Services       Precautions / Restrictions Precautions Precaution Comments: MONITOR O2, NEEDS o2 Restrictions Weight Bearing Restrictions: No    Mobility  Bed Mobility Overal bed mobility: Needs Assistance Bed Mobility: Supine to Sit     Supine to sit: Min guard;Supervision     General bed mobility comments: min guard for safety  Transfers Overall transfer level: Needs assistance Equipment used: Rolling walker (2 wheeled) Transfers: Sit to/from Stand Sit to Stand: Supervision         General transfer comment: VC's for hand placement and safety with walker  Ambulation/Gait Ambulation/Gait assistance: Min guard Ambulation Distance (Feet): 50 Feet Assistive device: Rolling walker (2 wheeled) Gait Pattern/deviations: Step-through pattern     General Gait Details: Oxygen sats dropped to 86% while on 2L [employer] with ambulation. Required VC's for safety with lines. min guard for safety, required short standing rest break. Cued for pursed lipped breathing   Stairs            Wheelchair Mobility    Modified Rankin (Stroke Patients Only)       Balance                                            Cognition Arousal/Alertness: Awake/alert Behavior During Therapy:  WFL for tasks assessed/performed Overall Cognitive Status: Within Functional Limits for tasks assessed                                        Exercises      General Comments        Pertinent Vitals/Pain Pain Assessment: No/denies pain    Home Living                      Prior Function            PT Goals (current goals can now be found in the care plan section)      Frequency    Min 2X/week      PT Plan      Co-evaluation              AM-PAC PT "6 Clicks" Daily Activity  Outcome Measure  Difficulty turning over in bed (including adjusting bedclothes, sheets and blankets)?: A Little Difficulty moving from lying on back to sitting on the side of the bed? : A Little Difficulty sitting down on and standing up from a chair with arms (e.g., wheelchair, bedside commode, etc,.)?: A Little Help needed moving to and from a bed to chair (including a wheelchair)?: A Little  Help needed walking in hospital room?: A Little Help needed climbing 3-5 steps with a railing? : A Little 6 Click Score: 18    End of Session Equipment Utilized During Treatment: Oxygen Activity Tolerance: Patient tolerated treatment well Patient left: in chair;with call bell/phone within reach;with chair alarm set;with family/visitor present Nurse Communication: Mobility status PT Visit Diagnosis: Difficulty in walking, not elsewhere classified (R26.2)     Time: 3557-3220 PT Time Calculation (min) (ACUTE ONLY): 22 min  Charges:  $Gait Training: 8-22 mins                    G Codes:       Almond Lint, [place] [facility] [phone]

## 2017-06-30 NOTE — Consult Note (Addendum)
   [facility] Inpatient Consult   06/30/2017  Ashley Savage 10-25-1940 [id number]      [name] active with [place] Management program.  Martin Majestic to bedside to speak with [name] just prior to hospital discharge.   Discussed ongoing [place] Management program services. She remains agreeable.  [name] will be new to home 02. Will have home health services thru [facility].   Patient could potentially benefit from outpatient palliative care services. Will discuss with [facility] RNCM.  [name], MSN-Ed, RN,BSN [facility] Liaison [phone]

## 2017-06-30 NOTE — Progress Notes (Signed)
Discharge planning, spoke with patient at beside. Discussed need for [facility] services and Oxygen. Chose [facility] for [facility] services, contacted [facility] for referral. Has RW and 3n1 at home. [phone]

## 2017-06-30 NOTE — Progress Notes (Signed)
Inpatient Diabetes Program Recommendations  AACE/ADA: New Consensus Statement on Inpatient Glycemic Control (2015)  Target Ranges:  Prepandial:   less than 140 mg/dL      Peak postprandial:   less than 180 mg/dL (1-2 hours)      Critically ill patients:  140 - 180 mg/dL   Results for DAIRA, HINE" (MRN [id number]) as of 06/30/2017 11:34  Ref. Range 06/29/2017 07:41 06/29/2017 12:05 06/29/2017 16:30 06/29/2017 21:42  Glucose-Capillary Latest Ref Range: 65 - 99 mg/dL 329 (H) 312 (H) 324 (H) 284 (H)   Results for ABBIE, BERLING" (MRN [id number]) as of 06/30/2017 11:34  Ref. Range 06/30/2017 08:03  Glucose-Capillary Latest Ref Range: 65 - 99 mg/dL 295 (H)    Home DM Meds: Levemir 14 units QHS                             Novolog 2-15 units TID per SSI  Current Insulin Orders: Levemir 17 units QHS                                       Novolog Sensitive Correction Scale/ SSI (0-9 units) TID AC       MD- Note patient got last dose Solumedrol 12/04 at 3am.  Now getting Prednisone 40 mg daily.  Please consider the following in-hospital insulin adjustments while patient getting steroids:  1. Increase Levemir to 20 units QHS   2. Start Novolog Meal Coverage: Novolog 4 units TID with meals (hold if pt eats <50% of meal)      --Will follow patient during hospitalization--  Wyn Quaker RN, MSN, CDE Diabetes Coordinator Inpatient Glycemic Control Team Team Pager: [phone] (8a-5p)

## 2017-07-01 ENCOUNTER — Telehealth: Payer: Self-pay

## 2017-07-01 NOTE — Telephone Encounter (Signed)
07/01/17  TCM Hospital Follow Up  Transition Care Management Follow-up Telephone Call  ADMISSION DATE: 06/25/17  DISCHARGE DATE: 06/30/17   How have you been since you were released from the hospital? Patient states she is feeling bad because of her breathing. States they will be coming today to show her how to use her O2   Do you understand why you were in the hospital? Yes   Do you understand the discharge instrcutions? Yes    Items Reviewed:  Medications reviewed: Yes   Allergies reviewed:Yes   Dietary changes reviewed :Regular per patient   Referrals reviewed:  Appointment scheduled with Mackie Pai for Hospital Follow Up   Functional Questionnaire:   Activities of Daily Living (ADLs): Patient states she can perform all independently  Any patient concerns? None at this time   Confirmed importance and date/time of follow-up visits scheduled: Yes   Confirmed with patient if condition begins to worsen call PCP or go to the ER. Yes    Patient was given the office number and encouragred to call back with questions or concerns. Yes

## 2017-07-02 ENCOUNTER — Other Ambulatory Visit: Payer: Self-pay | Admitting: *Deleted

## 2017-07-02 ENCOUNTER — Telehealth: Payer: Self-pay | Admitting: Family Medicine

## 2017-07-02 ENCOUNTER — Other Ambulatory Visit: Payer: Self-pay

## 2017-07-02 DIAGNOSIS — J209 Acute bronchitis, unspecified: Secondary | ICD-10-CM | POA: Diagnosis not present

## 2017-07-02 DIAGNOSIS — J440 Chronic obstructive pulmonary disease with acute lower respiratory infection: Secondary | ICD-10-CM | POA: Diagnosis not present

## 2017-07-02 DIAGNOSIS — H8110 Benign paroxysmal vertigo, unspecified ear: Secondary | ICD-10-CM | POA: Diagnosis not present

## 2017-07-02 DIAGNOSIS — K7581 Nonalcoholic steatohepatitis (NASH): Secondary | ICD-10-CM | POA: Diagnosis not present

## 2017-07-02 DIAGNOSIS — J441 Chronic obstructive pulmonary disease with (acute) exacerbation: Secondary | ICD-10-CM | POA: Diagnosis not present

## 2017-07-02 DIAGNOSIS — F329 Major depressive disorder, single episode, unspecified: Secondary | ICD-10-CM | POA: Diagnosis not present

## 2017-07-02 DIAGNOSIS — Z7951 Long term (current) use of inhaled steroids: Secondary | ICD-10-CM | POA: Diagnosis not present

## 2017-07-02 DIAGNOSIS — Z853 Personal history of malignant neoplasm of breast: Secondary | ICD-10-CM | POA: Diagnosis not present

## 2017-07-02 DIAGNOSIS — E785 Hyperlipidemia, unspecified: Secondary | ICD-10-CM | POA: Diagnosis not present

## 2017-07-02 DIAGNOSIS — F419 Anxiety disorder, unspecified: Secondary | ICD-10-CM | POA: Diagnosis not present

## 2017-07-02 DIAGNOSIS — F17210 Nicotine dependence, cigarettes, uncomplicated: Secondary | ICD-10-CM | POA: Diagnosis not present

## 2017-07-02 DIAGNOSIS — Z9011 Acquired absence of right breast and nipple: Secondary | ICD-10-CM | POA: Diagnosis not present

## 2017-07-02 DIAGNOSIS — K7460 Unspecified cirrhosis of liver: Secondary | ICD-10-CM | POA: Diagnosis not present

## 2017-07-02 DIAGNOSIS — Z8679 Personal history of other diseases of the circulatory system: Secondary | ICD-10-CM | POA: Diagnosis not present

## 2017-07-02 DIAGNOSIS — Z9981 Dependence on supplemental oxygen: Secondary | ICD-10-CM | POA: Diagnosis not present

## 2017-07-02 DIAGNOSIS — E119 Type 2 diabetes mellitus without complications: Secondary | ICD-10-CM | POA: Diagnosis not present

## 2017-07-02 DIAGNOSIS — Z794 Long term (current) use of insulin: Secondary | ICD-10-CM | POA: Diagnosis not present

## 2017-07-02 MED ORDER — PREDNISONE 10 MG PO TABS: ORAL_TABLET | ORAL | 0 refills | Status: DC

## 2017-07-02 NOTE — Telephone Encounter (Signed)
Spoke with physical therapist  Sent rx in

## 2017-07-02 NOTE — Telephone Encounter (Signed)
Copied from [id number]. Topic: Quick Communication - See Telephone Encounter >> Jul 02, 2017 12:11 PM Arletha Grippe wrote: CRM for notification. See Telephone encounter for:   07/02/17.[facility] - requesting verbal orders for physical therapy  1 week 1 2 week 3  1 week 2  Cb number is [phone]

## 2017-07-02 NOTE — Patient Outreach (Signed)
[place] [facility]) Care Management  07/02/2017  SWAY GUTTIERREZ 11/09/1940 [id number]    Transition of care (initial related to 12/5 discharge)  RN spoke with pt and confirmed identifiers and discussed her recent hospitalization. RN completed the transition of care template and discussed the following areas:  MEDICATIONS: Pt reports she has been ordered to take Prednisone and indicates her son will pick up this medication from her local pharmacy today and she will start this medication. RN discussed the orders or tapping this medication for pt's understanding (verbalized an understanding). Pt mentioned she is taking ProAir but her Pro Air is to expensive so she is taking Ventolin. After reviewing the pt's medication list either medication is on the list. RN verified pt's list from the hospital did not have these medications on the list but Albuterol nebulizer and Stiolto-Respimat were listed. Pt verified and RN has informed pt to contact her provider if she feels she needs the other inhalers that again are not listed on pt's current medication list. RN also offered a pharmacy consult for today if she needed assistance however pt opt to decline this assistance.  LEVEL OF CARE: RN also discussed pt's past and current medication issues and introduced the agency [facility] related to her chronic illness and medical issues. RN offered a consultation that would be discussed with her provider if she receives such services. Pt opt to decline at this time indicating she feels that she is improving at this time. [facility]: Pt verified she is only receiving PT at this time. No nursing involved and they would be visiting 1 day next week and 2 days a week after the initial week. States the agency visited today with some ambulation services provided ([place]). Pt states she is now on home O2 at 2 liters and taking the antibiotics which is improving her conjunctivitis to her both her eyes.  States her primary provider's appointment is scheduled on 12/19 in 2 weeks and she will have labs drawn on 12/12. RN has again offered home visit but pt has opted to decline but remains receptive once again to the ongoing transition of care contacts in managing her COPD. Plan of care updated and adjusted accordingly related to pt's recent hospitalization on knowledge deficit and medication adherence. Will continue transition of care over the next month and continue to address pt's needs. Currently pt has [facility] nursing at this time. No other request or inquires at this time. Will scheduled and follow up accordingly.    Patient was recently discharged from hospital and all medications have been reviewed.  [name], RN Care Management Coordinator [place] Office [phone]

## 2017-07-02 NOTE — Telephone Encounter (Signed)
Copied from [place] [id number]. Topic: Quick Communication - Rx Refill/Question >> Jul 02, 2017 11:20 AM Arletha Grippe wrote: Has the patient contacted their pharmacy? Yes.     (Agent: If no, request that the patient contact the pharmacy for the refill.)   Preferred Pharmacy (with phone number or street name): [facility] - ( he is doing assessment for home health)the pt was supposed to get taper prednisone pack.it is in the hospital discharge info, the pharmacy never gor a rx for this.  Thurmond Butts is asking for rx to be called in 10 mg tablet. Take 40mg  daily for 2 days Take 30 mg daily for 2 days 20 mg daily for 2 days 10 mg daily for 2 days, then stop.  [facility] on [address] in [place].  Cb number is [phone]    Agent: Please be advised that RX refills may take up to 3 business days. We ask that you follow-up with your pharmacy.

## 2017-07-05 ENCOUNTER — Other Ambulatory Visit: Payer: Self-pay | Admitting: Pharmacist

## 2017-07-05 NOTE — Patient Outreach (Signed)
[place] [facility]) Care Management  07/05/2017  Ashley Savage Apr 18, 1941 [id number]   Patient was called regarding post discharge medication review. Unfortunately, she did not answer the phone. HIPAA compliant message was left on the patient's voicemail.  Plan: Call the patient back within three business days.  [name], PharmD, [place] Clinical Pharmacist [phone]

## 2017-07-06 NOTE — Telephone Encounter (Signed)
Please provide if necessary / Ashley Savage  Signed  Creation Time: 07/02/2017 11:24 AM      Copied from [place] [id number]. Topic: Quick Communication - Rx Refill/Question >> Jul 02, 2017 11:20 AM Ashley Savage wrote: Has the patient contacted their pharmacy? Yes.     (Agent: If no, request that the patient contact the pharmacy for the refill.)   Preferred Pharmacy (with phone number or street name): [facility] - ( he is doing assessment for home health)the pt was supposed to get taper prednisone pack.it is in the hospital discharge info, the pharmacy never gor a rx for this.  Thurmond Butts is asking for rx to be called in 10 mg tablet. Take 40mg  daily for 2 days Take 30 mg daily for 2 days 20 mg daily for 2 days 10 mg daily for 2 days, then stop.  [facility] on [address] and [address] in [place].  Cb number is [phone]    Agent: Please be advised that RX refills may take up to 3 business days. We ask that you follow-up with your pharmacy.

## 2017-07-07 ENCOUNTER — Telehealth: Payer: Self-pay

## 2017-07-07 ENCOUNTER — Other Ambulatory Visit: Payer: Self-pay

## 2017-07-07 ENCOUNTER — Other Ambulatory Visit (HOSPITAL_BASED_OUTPATIENT_CLINIC_OR_DEPARTMENT_OTHER): Payer: PPO

## 2017-07-07 ENCOUNTER — Other Ambulatory Visit: Payer: Self-pay | Admitting: Pharmacist

## 2017-07-07 ENCOUNTER — Ambulatory Visit (HOSPITAL_BASED_OUTPATIENT_CLINIC_OR_DEPARTMENT_OTHER): Payer: PPO | Admitting: Family

## 2017-07-07 VITALS — BP 105/39 | HR 73 | Temp 97.6°F | Resp 22 | Wt 183.0 lb

## 2017-07-07 DIAGNOSIS — K7581 Nonalcoholic steatohepatitis (NASH): Secondary | ICD-10-CM | POA: Diagnosis not present

## 2017-07-07 DIAGNOSIS — Z853 Personal history of malignant neoplasm of breast: Secondary | ICD-10-CM | POA: Diagnosis not present

## 2017-07-07 DIAGNOSIS — K7460 Unspecified cirrhosis of liver: Secondary | ICD-10-CM

## 2017-07-07 DIAGNOSIS — D696 Thrombocytopenia, unspecified: Secondary | ICD-10-CM

## 2017-07-07 DIAGNOSIS — C50019 Malignant neoplasm of nipple and areola, unspecified female breast: Secondary | ICD-10-CM

## 2017-07-07 LAB — CBC WITH DIFFERENTIAL (CANCER CENTER ONLY)
BASO#: 0 10*3/uL (ref 0.0–0.2)
BASO%: 0.3 % (ref 0.0–2.0)
EOS%: 2.3 % (ref 0.0–7.0)
Eosinophils Absolute: 0.3 10*3/uL (ref 0.0–0.5)
HEMATOCRIT: 36.7 % (ref 34.8–46.6)
HGB: 12.4 g/dL (ref 11.6–15.9)
LYMPH#: 1.4 10*3/uL (ref 0.9–3.3)
LYMPH%: 11.7 % — ABNORMAL LOW (ref 14.0–48.0)
MCH: 33.5 pg (ref 26.0–34.0)
MCHC: 33.8 g/dL (ref 32.0–36.0)
MCV: 99 fL (ref 81–101)
MONO#: 1.4 10*3/uL — ABNORMAL HIGH (ref 0.1–0.9)
MONO%: 12.2 % (ref 0.0–13.0)
NEUT#: 8.5 10*3/uL — ABNORMAL HIGH (ref 1.5–6.5)
NEUT%: 73.5 % (ref 39.6–80.0)
PLATELETS: 83 10*3/uL — AB (ref 145–400)
RBC: 3.7 10*6/uL (ref 3.70–5.32)
RDW: 16.4 % — AB (ref 11.1–15.7)
WBC: 11.5 10*3/uL — ABNORMAL HIGH (ref 3.9–10.0)

## 2017-07-07 LAB — CMP (CANCER CENTER ONLY)
ALK PHOS: 107 U/L — AB (ref 26–84)
ALT: 44 U/L (ref 10–47)
AST: 42 U/L — AB (ref 11–38)
Albumin: 2.4 g/dL — ABNORMAL LOW (ref 3.3–5.5)
BUN, Bld: 22 mg/dL (ref 7–22)
CALCIUM: 9 mg/dL (ref 8.0–10.3)
CO2: 30 mEq/L (ref 18–33)
CREATININE: 1.2 mg/dL (ref 0.6–1.2)
Chloride: 99 mEq/L (ref 98–108)
GLUCOSE: 178 mg/dL — AB (ref 73–118)
Potassium: 5.8 mEq/L — ABNORMAL HIGH (ref 3.3–4.7)
Sodium: 141 mEq/L (ref 128–145)
Total Bilirubin: 2.3 mg/dl — ABNORMAL HIGH (ref 0.20–1.60)
Total Protein: 5.3 g/dL — ABNORMAL LOW (ref 6.4–8.1)

## 2017-07-07 MED ORDER — INSULIN ASPART 100 UNIT/ML FLEXPEN: PEN_INJECTOR | SUBCUTANEOUS | 0 refills | Status: DC

## 2017-07-07 MED FILL — NOVOLOG FLEXPEN SYRINGE: 100 | 28 days supply | Qty: 3 | Fill #0

## 2017-07-07 NOTE — Telephone Encounter (Signed)
rx was sent in

## 2017-07-07 NOTE — Telephone Encounter (Signed)
Error

## 2017-07-07 NOTE — Patient Outreach (Signed)
[facility] [facility]) Care Management  07/07/2017  Ashley Savage Dec 08, 1940 [id number]   Patient was called regarding medication review post discharge. HIPAA identifiers were obtained. Patient said she was on her way to a dr's appointment and would not be able to go over her medications.  Plan:  Call patient back within 3 business days.  [name], PharmD, [place] Clinical Pharmacist [phone]

## 2017-07-07 NOTE — Progress Notes (Signed)
Hematology and Oncology Follow Up Visit  Ashley Savage [id number] 04-17-1941 76 y.o. 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 She just got out of the hospital for exacerbation of COPD. She is finishing a prednisone taper and is a little puffy in her face, hands and ankles with the steroid.  She is wearing her compression stockings and using a walker today for support.  She is taking her lasix as prescribed which is helping some with the swelling.  She has doing PT several days a week to help with stregthening her knees.  No c/o numbness or tingling in her extremities. Platelet count is 83. Hgb stable 12.4 with an MCV of 99.  Breast exam today was unremarkable.  No fever, chills, n/v, cough, rash, dizziness, SOB, chest pain, palpitations, abdominal pain or changes in bowel or bladder habits.  She denies any episodes of bleeding.  Her potassium is high at 5.8 today. The patient states that she has had issues with this in the past as well as low potassium. She is not on an oral supplement but has been eating lots of bananas. She verbalized that she does not need an intervention at this time.  She has a good appetite and is hydrating well.   ECOG Performance Status: 2 - Symptomatic, <50% confined to bed  Medications:  Allergies as of 07/07/2017      Reactions   Citalopram Palpitations   Irregular heart beat   Ciprofloxacin    Mental status change   Erythromycin Other (See Comments)   Stomach cramps   Glimepiride    Elevated ammonia levels   Prednisone    Increased blood sugars too high      Medication List        Accurate as of 07/07/17  4:06 PM. Always use your most recent med list.          albuterol 1.25 MG/3ML nebulizer  solution Commonly known as:  ACCUNEB Take 3 mLs (1.25 mg total) by nebulization every 6 (six) hours as needed for wheezing.   amoxicillin-clavulanate 875-125 MG tablet Commonly known as:  AUGMENTIN Take 1 tablet by mouth every 12 (twelve) hours.   furosemide 40 MG tablet Commonly known as:  LASIX Take 1.5 tablets (60 mg total) by mouth daily.   guaiFENesin 600 MG 12 hr tablet Commonly known as:  MUCINEX Take 2 tablets (1,200 mg total) by mouth 2 (two) times daily as needed for to loosen phlegm.   insulin aspart 100 UNIT/ML FlexPen Commonly known as:  NOVOLOG FLEXPEN Sliding scale 121-150 - 2 units; 151-200 - 3 units; 201-250 -5 units; 251-300 - 8 units; 301-350 - 11units; 351-400 - 15 unitsc351-400 -15 units; greater than 400 -15 units and call MD.   insulin detemir 100 UNIT/ML injection Commonly known as:  LEVEMIR Inject 0.17 mLs (17 Units total) into the skin daily at 10 pm.   lactulose 10 GM/15ML solution Commonly known as:  CHRONULAC TAKE 45 ML BY MOUTH TWICE DAILY AS NEEDED FOR MILD CONSTIPATION   mometasone 50 MCG/ACT nasal spray Commonly known as:  NASONEX Place 2 sprays into the nose daily.   omeprazole 20 MG capsule Commonly known as:  PRILOSEC TAKE 1 CAPSULE(20 MG) BY MOUTH DAILY   polyvinyl alcohol 1.4 % ophthalmic  solution Commonly known as:  LUBRICANT DROPS Place 1 drop into both eyes as needed (dry eyes).   predniSONE 10 MG tablet Commonly known as:  DELTASONE 40 mg po daily x 2 daily then 30 mg po daily x 2 day then20 mg po daily x 2 day then 10 mg daily x 2 day then stop   rifaximin 550 MG Tabs tablet Commonly known as:  XIFAXAN Take 1 tablet (550 mg total) by mouth 2 (two) times daily.   spironolactone 100 MG tablet Commonly known as:  ALDACTONE Take 1.5 tablets (150 mg total) by mouth daily.   Tiotropium Bromide-Olodaterol 2.5-2.5 MCG/ACT Aers Commonly known as:  STIOLTO RESPIMAT Inhale 2 puffs into the lungs daily.   Vitamin D3 2000 units  Tabs Take 2,000 Units by mouth every morning.       Allergies:  Allergies  Allergen Reactions  . Citalopram Palpitations    Irregular heart beat  . Ciprofloxacin     Mental status change  . Erythromycin Other (See Comments)    Stomach cramps  . Glimepiride     Elevated ammonia levels  . Prednisone     Increased blood sugars too high    Past Medical History, Surgical history, Social history, and Family History were reviewed and updated.  Review of Systems: All other 10 point review of systems is negative.   Physical Exam:  weight is 183 lb (83 kg). Her oral temperature is 97.6 F (36.4 C). Her blood pressure is 105/39 (abnormal) and her pulse is 73. Her respiration is 22 (abnormal) and oxygen saturation is 93%.   Wt Readings from Last 3 Encounters:  07/07/17 183 lb (83 kg)  06/26/17 160 lb 15 oz (73 kg)  06/24/17 157 lb 3.2 oz (71.3 kg)    Ocular: Sclerae unicteric, pupils equal, round and reactive to light Ear-nose-throat: Oropharynx clear, dentition fair Lymphatic: No cervical, supraclavicular or axillary adenopathy Lungs no rales or rhonchi, good excursion bilaterally Heart regular rate and rhythm, no murmur appreciated Abd soft, nontender, positive bowel sounds, no liver or spleen tip palpated on exam, no fluid wave  MSK no focal spinal tenderness, no joint edema Neuro: non-focal, well-oriented, appropriate affect Breasts: Left breast no mass, lesion or rash noted. Right mastectomy intact. No changes.   Lab Results  Component Value Date   WBC 11.5 (H) 07/07/2017   HGB 12.4 07/07/2017   HCT 36.7 07/07/2017   MCV 99 07/07/2017   PLT 83 (L) 07/07/2017   Lab Results  Component Value Date   FERRITIN 16 07/31/2015   IRON 54 07/31/2015   TIBC 344 07/31/2015   UIBC 290 07/31/2015   IRONPCTSAT 16 07/31/2015   Lab Results  Component Value Date   RBC 3.70 07/07/2017   No results found for: KPAFRELGTCHN, LAMBDASER, KAPLAMBRATIO Lab Results  Component Value  Date   IGGSERUM TEST NOT PERFORMED 01/24/2015   IGGSERUM 835 01/24/2015   No results found for: Odetta Pink, SPEI   Chemistry      Component Value Date/Time   NA 141 07/07/2017 1102   NA 139 06/29/2016 1128   K 5.8 (H) 07/07/2017 1102   K 5.0 06/29/2016 1128   CL 99 07/07/2017 1102   CO2 30 07/07/2017 1102   CO2 24 06/29/2016 1128   BUN 22 07/07/2017 1102   BUN 15.9 06/29/2016 1128   CREATININE 1.2 07/07/2017 1102   CREATININE 1.1 06/29/2016 1128      Component Value Date/Time  CALCIUM 9.0 07/07/2017 1102   CALCIUM 9.0 06/29/2016 1128   ALKPHOS 107 (H) 07/07/2017 1102   ALKPHOS 129 06/29/2016 1128   AST 42 (H) 07/07/2017 1102   AST 36 (H) 06/29/2016 1128   ALT 44 07/07/2017 1102   ALT 24 06/29/2016 1128   BILITOT 2.30 (H) 07/07/2017 1102   BILITOT 1.78 (H) 06/29/2016 1128      Impression and Plan: Ms. Crego is a very pleasant 76 yo caucasian female with history of stage IIa ductal carcinoma of the right breast with mastectomy. Tumor was ER positive and she completed 5 years of Femara in February 2015. Breast exam today was unremarkable.  She also has thrombocytopenia associated with NASH. Platelet count is 83 and she is asymptomatic at this time.  She is finishing her steroid taper for exacerbation of COPD.  We will plan to see her back again in another 6 months for repeat lab and follow-up.  She will contact our office with any questions or concerns. We can certainly see her sooner if need be.   Laverna Peace, NP 12/12/20184:06 PM

## 2017-07-07 NOTE — Telephone Encounter (Signed)
rx was sent into pharmacy.  

## 2017-07-08 ENCOUNTER — Other Ambulatory Visit: Payer: Self-pay | Admitting: Pharmacist

## 2017-07-08 NOTE — Patient Outreach (Signed)
[facility]) Care Management  [place]   07/08/2017  ADALYNNE STEFFENSMEIER [birth year] [id number]  Subjective: Patient was called for post discharge medication review. HIPAA identifiers were obtained.  Patient is a 76 year old female with multiple medical conditions including but not limited to:  liver cirrhosis, NASH,diabetes mellitus, hyperlipidemia,COPD, AAA, type 2 diabetes, esophageal varices and vitamin D deficiency.  Patient reported managing her medications on her own.  She was hospitalized 06/25/17-06/30/17 for COPD exacerbation.    Objective:   Encounter Medications: Outpatient Encounter Medications as of 07/08/2017  Medication Sig  . albuterol (ACCUNEB) 1.25 MG/3ML nebulizer solution Take 3 mLs (1.25 mg total) by nebulization every 6 (six) hours as needed for wheezing.  . Cholecalciferol (VITAMIN D3) 2000 units TABS Take 2,000 Units by mouth every morning.  . furosemide (LASIX) 40 MG tablet Take 1.5 tablets (60 mg total) by mouth daily. (Patient taking differently: Take 40 mg by mouth daily. )  . guaiFENesin (MUCINEX) 600 MG 12 hr tablet Take 2 tablets (1,200 mg total) by mouth 2 (two) times daily as needed for to loosen phlegm.  . insulin aspart (NOVOLOG FLEXPEN) 100 UNIT/ML FlexPen Sliding scale 121-150 - 2 units; 151-200 - 3 units; 201-250 -5 units; 251-300 - 8 units; 301-350 - 11units; 351-400 - 15 unitsc351-400 -15 units; greater than 400 -15 units and call MD.  . insulin detemir (LEVEMIR) 100 UNIT/ML injection Inject 0.17 mLs (17 Units total) into the skin daily at 10 pm.  . mometasone (NASONEX) 50 MCG/ACT nasal spray Place 2 sprays into the nose daily.  Marland Kitchen omeprazole (PRILOSEC) 20 MG capsule TAKE 1 CAPSULE(20 MG) BY MOUTH DAILY  . polyvinyl alcohol (LUBRICANT DROPS) 1.4 % ophthalmic solution Place 1 drop into both eyes as needed (dry eyes).  . predniSONE (DELTASONE) 10 MG tablet 40 mg po daily x 2 daily then 30 mg po daily x 2 day then20 mg po daily  x 2 day then 10 mg daily x 2 day then stop  . rifaximin (XIFAXAN) 550 MG TABS tablet Take 1 tablet (550 mg total) by mouth 2 (two) times daily.  Marland Kitchen spironolactone (ALDACTONE) 100 MG tablet Take 1.5 tablets (150 mg total) by mouth daily. (Patient taking differently: Take 100 mg by mouth daily. )  . Tiotropium Bromide-Olodaterol (STIOLTO RESPIMAT) 2.5-2.5 MCG/ACT AERS Inhale 2 puffs into the lungs daily.  Marland Kitchen lactulose (CHRONULAC) 10 GM/15ML solution TAKE 45 ML BY MOUTH TWICE DAILY AS NEEDED FOR MILD CONSTIPATION (Patient not taking: Reported on 07/08/2017)  . [DISCONTINUED] amoxicillin-clavulanate (AUGMENTIN) 875-125 MG tablet Take 1 tablet by mouth every 12 (twelve) hours. (Patient not taking: Reported on 07/08/2017)   No facility-administered encounter medications on file as of 07/08/2017.     Functional Status: In your present state of health, do you have any difficulty performing the following activities: 06/26/2017 06/25/2017  Hearing? - N  Comment - -  Vision? - N  Difficulty concentrating or making decisions? - N  Walking or climbing stairs? - N  Comment - -  Dressing or bathing? - N  Doing errands, shopping? N N  Preparing Food and eating ? - -  Using the Toilet? - -  In the past six months, have you accidently leaked urine? - -  Do you have problems with loss of bowel control? - -  Managing your Medications? - -  Managing your Finances? - -  Housekeeping or managing your Housekeeping? - -  Some recent data might be hidden  Fall/Depression Screening: Fall Risk  05/18/2017 02/10/2017 12/07/2016  Falls in the past year? No No Yes  Comment - patient reports no falls within the last 3 months. -  Number falls in past yr: - - 1  Injury with Fall? - - Yes  Comment - - pelvis and spine misaligned  Risk Factor Category  - High Fall Risk High Fall Risk  Risk for fall due to : Impaired mobility History of fall(s) History of fall(s)  Risk for fall due to: Comment - - -  Follow up -  Falls prevention discussed Falls prevention discussed   PHQ 2/9 Scores 05/18/2017 11/20/2016 10/22/2016 05/04/2016 02/25/2016 02/08/2015 02/08/2015  PHQ - 2 Score 0 0 1 0 0 1 0      Assessment: ASSESSMENT: Date Discharged from Hospital: 06/30/2017 Date Medication Reconciliation Performed: 07/08/2017  No new medications were prescribed at discharge.  Patient was recently discharged from hospital and all medications have been reviewed   Drugs sorted by system:  Neurologic/Psychologic:  Cardiovascular: Furosemide  Pulmonary/Allergy: Mometasone Nasal Spray Guaifenesin Albuterol nebulizer solution Prednisone Stiloto  Gastrointestinal: Omeprazole Lactulose (patient said she is not taking this) Xifaxan (was not on the discharge summary) Spironolactone  Endocrine: Levemir Novolog  Vitamins/Minerals: Vitamin D  Infectious Diseases:  Miscellaneous: Polyvinyl Alcohol (eye drops)  Medication Review Findings:  Dose Discrepancies/Adherence  -Furosemide 40mg - patient has only been taking 1 tablet daily the instructions on the discharge summary were 60mg  daily. Patient was instructed to increase the dose to what was written at discharge.  -Spironolactone 100mg -patient has only been taking 1 tablet daily. The instructions for Spironolactone 100mg  were 1.5 tablets (150mg ) at discharge.  Patient was NOT instructed to increase her dose to what was written because her most recent potassium was 5.8 (CMP drawn by Oncology 07/07/17). K on 06/30/17 was 4.5    PLAN: -Route note to patient's PCP with a note -Call the PCP office about spironolactone dosing. -Close pharmacy case after deciphering the spironolactone dose.   [name], PharmD, [place] Clinical Pharmacist [phone]

## 2017-07-09 ENCOUNTER — Telehealth: Payer: Self-pay | Admitting: Family Medicine

## 2017-07-09 ENCOUNTER — Other Ambulatory Visit: Payer: Self-pay | Admitting: *Deleted

## 2017-07-09 ENCOUNTER — Other Ambulatory Visit: Payer: Self-pay | Admitting: Pharmacist

## 2017-07-09 NOTE — Telephone Encounter (Signed)
Copied from [place]. Topic: Quick Communication - See Telephone Encounter >> Jul 09, 2017  9:55 AM Synthia Innocent wrote: CRM for notification. See Telephone encounter for:  [facility] has a question regarding spironolactone (ALDACTONE) 100 MG tablet, states patient is only taking 1 pill a day, directions 1 1/2 a day. Please advise 07/09/17.

## 2017-07-09 NOTE — Telephone Encounter (Signed)
Patient called back to voice her opinion about her Lasix and Spironlactone. Feels she is taking too much since the swelling is going down. States it was increased by another Dr. While she was hospitalized. Advised Dr. Charlett Blake tried to call her back today and that she was gone for the day. Patient states she will try and talk to provider on Monday.

## 2017-07-09 NOTE — Patient Outreach (Addendum)
[facility]) Care Management  07/09/2017  Ashley Savage 03-20-1941 [id number]    Transition of care  RN attempted transition of care outreach however unsuccessful. RN able to please a HIPAA approved voice message requesting a call back. Will rescheduled another outreach call accordingly for ongoing [facility] services.  [name], RN Care Management Coordinator [place] Office [phone]

## 2017-07-09 NOTE — Telephone Encounter (Signed)
Left message for pt. To call back and discuss medication.

## 2017-07-09 NOTE — Patient Outreach (Signed)
[place] [facility]) Care Management  07/09/2017  Ashley Savage 04-23-1941 [id number]   Patient was called to follow up on Spironolactone. HIPAA identifiers were obtained.  Dr. Azalee Course office was called to inquire about the Spironolactone dose especially with the patient's K being 5.8.  Someone from Dr. Azalee Course office called the patient back this morning to discuss the dose but the patient was not home.  When I spoke with the patient this afternoon, she said she did not know their office had called her.  Patient was encouraged to call them and she said she would as soon as we hung up.  Plan: Call patient back next week to check on the spironolactone dose Close pharmacy case.  [name], PharmD, [place] Clinical Pharmacist [phone]

## 2017-07-11 NOTE — Telephone Encounter (Signed)
She will be seen on 12/19

## 2017-07-12 ENCOUNTER — Encounter: Payer: Self-pay | Admitting: Gastroenterology

## 2017-07-12 ENCOUNTER — Other Ambulatory Visit: Payer: Self-pay | Admitting: *Deleted

## 2017-07-12 ENCOUNTER — Encounter: Payer: Self-pay | Admitting: Pulmonary Disease

## 2017-07-12 ENCOUNTER — Encounter: Payer: Self-pay | Admitting: Medical

## 2017-07-12 DIAGNOSIS — R0609 Other forms of dyspnea: Secondary | ICD-10-CM | POA: Diagnosis not present

## 2017-07-12 DIAGNOSIS — R0902 Hypoxemia: Secondary | ICD-10-CM | POA: Diagnosis not present

## 2017-07-12 DIAGNOSIS — J189 Pneumonia, unspecified organism: Secondary | ICD-10-CM | POA: Diagnosis not present

## 2017-07-12 NOTE — Patient Outreach (Signed)
[facility]) Care Management  07/12/2017  Ashley Savage 08-09-1940 [id number]    Transition of care  RN spoke with pt today and verified identifiers prior to inquiring on pt's ongoing self management of care. Based upon Epic indicating pt has contacted her provider concerning her insulin and received clarification. Pt was also working with [place] on one of her medication Spironolactone (take 1 pill orders for 1 1/2) indicating pt was to contact her provider to clarify. Pt also has question on her Lasix (taking 1 pill (40mg ) but orders indicates 1 1/2 pill.  Pt states she call last week but the call was missed. Pt also indicated she contacted her provider's office again this morning. Pt indicates she receives a quicker response communicating with her providers via [place]. RN has encouraged pt to communicate with her provider in [place]. Pt also has a pending appointment in two days with her primary provider and will address at that time if no call back prior to this date. RN stress the importance of communicating with her provider and take the appropriate dosage to prevent acute problems from occurring related to her COPD. RN further educated pt on her COPD action plan and reviewed the current plan of care and goals. Also discussed pursed lip breathing to improve her oxygenation with the use of her home O2 via 3 liters (nasal cannula). Pt states she has a pulse ox with readings at 92%. RN verified pt continues to use her inhalers but is not utilizing her neb treatments as prescribed via Epic. RN verified it is recommended for her to her nebs q 6 hours as needed. Pt indicated she would use her nebs more to improving her breathing but was not aware she could use her neb treatments that often.. States she has been "mouth breathing" more and has more discolored phlegm with no fevers or chills. States she will again use her nebs more and obtain more Mucinex to assist with the add  phelgem.  Pt also states [facility] remains involved with ongoing PT services. RN continues to offer home visits for a more one-on-one consultation to discuss and review her ongoing disease management  However pt remains only receptive to ongoing transition of care calls. Will scheduled the next follow up next week and review the plan of care and pr's progress.   [name], RN Care Management Coordinator [place] Office [phone]

## 2017-07-13 ENCOUNTER — Other Ambulatory Visit: Payer: Self-pay

## 2017-07-13 DIAGNOSIS — I8510 Secondary esophageal varices without bleeding: Secondary | ICD-10-CM

## 2017-07-13 DIAGNOSIS — J189 Pneumonia, unspecified organism: Secondary | ICD-10-CM | POA: Diagnosis not present

## 2017-07-13 DIAGNOSIS — R0609 Other forms of dyspnea: Secondary | ICD-10-CM | POA: Diagnosis not present

## 2017-07-13 DIAGNOSIS — R0902 Hypoxemia: Secondary | ICD-10-CM | POA: Diagnosis not present

## 2017-07-13 DIAGNOSIS — K7460 Unspecified cirrhosis of liver: Secondary | ICD-10-CM

## 2017-07-14 ENCOUNTER — Telehealth: Payer: Self-pay | Admitting: Medical

## 2017-07-14 ENCOUNTER — Ambulatory Visit: Payer: Self-pay | Admitting: Pharmacist

## 2017-07-14 ENCOUNTER — Other Ambulatory Visit: Payer: Self-pay | Admitting: Pharmacist

## 2017-07-14 ENCOUNTER — Encounter: Payer: Self-pay | Admitting: Medical

## 2017-07-14 ENCOUNTER — Ambulatory Visit (HOSPITAL_BASED_OUTPATIENT_CLINIC_OR_DEPARTMENT_OTHER)
Admission: RE | Admit: 2017-07-14 | Discharge: 2017-07-14 | Disposition: A | Discharge status: Home / Self Care | Payer: PPO | Source: Ambulatory Visit | Attending: Medical | Admitting: Medical

## 2017-07-14 ENCOUNTER — Ambulatory Visit: Payer: PPO | Admitting: Medical

## 2017-07-14 VITALS — BP 107/33 | HR 63 | Temp 97.6°F | Resp 16 | Ht 65.0 in | Wt 176.0 lb

## 2017-07-14 DIAGNOSIS — R0602 Shortness of breath: Secondary | ICD-10-CM | POA: Diagnosis not present

## 2017-07-14 DIAGNOSIS — R0600 Dyspnea, unspecified: Secondary | ICD-10-CM

## 2017-07-14 DIAGNOSIS — R918 Other nonspecific abnormal finding of lung field: Secondary | ICD-10-CM | POA: Insufficient documentation

## 2017-07-14 DIAGNOSIS — K7581 Nonalcoholic steatohepatitis (NASH): Secondary | ICD-10-CM | POA: Diagnosis not present

## 2017-07-14 DIAGNOSIS — J439 Emphysema, unspecified: Secondary | ICD-10-CM | POA: Diagnosis not present

## 2017-07-14 DIAGNOSIS — J189 Pneumonia, unspecified organism: Secondary | ICD-10-CM | POA: Diagnosis not present

## 2017-07-14 DIAGNOSIS — D72829 Elevated white blood cell count, unspecified: Secondary | ICD-10-CM | POA: Diagnosis not present

## 2017-07-14 DIAGNOSIS — R0902 Hypoxemia: Secondary | ICD-10-CM

## 2017-07-14 LAB — CBC WITH DIFFERENTIAL/PLATELET
BASOS ABS: 0.1 10*3/uL (ref 0.0–0.1)
Basophils Relative: 0.8 % (ref 0.0–3.0)
EOS PCT: 2.5 % (ref 0.0–5.0)
Eosinophils Absolute: 0.3 10*3/uL (ref 0.0–0.7)
HCT: 37.5 % (ref 36.0–46.0)
HEMOGLOBIN: 12.2 g/dL (ref 12.0–15.0)
LYMPHS ABS: 1.1 10*3/uL (ref 0.7–4.0)
Lymphocytes Relative: 11.2 % — ABNORMAL LOW (ref 12.0–46.0)
MCHC: 32.6 g/dL (ref 30.0–36.0)
MCV: 102.3 fl — ABNORMAL HIGH (ref 78.0–100.0)
MONOS PCT: 11.7 % (ref 3.0–12.0)
Monocytes Absolute: 1.2 10*3/uL — ABNORMAL HIGH (ref 0.1–1.0)
NEUTROS PCT: 73.8 % (ref 43.0–77.0)
Neutro Abs: 7.4 10*3/uL (ref 1.4–7.7)
Platelets: 111 10*3/uL — ABNORMAL LOW (ref 150.0–400.0)
RBC: 3.66 Mil/uL — AB (ref 3.87–5.11)
RDW: 17.4 % — ABNORMAL HIGH (ref 11.5–15.5)
WBC: 10 10*3/uL (ref 4.0–10.5)

## 2017-07-14 LAB — COMPREHENSIVE METABOLIC PANEL
ALBUMIN: 2.1 g/dL — AB (ref 3.5–5.2)
ALK PHOS: 107 U/L (ref 39–117)
ALT: 25 U/L (ref 0–35)
AST: 21 U/L (ref 0–37)
BILIRUBIN TOTAL: 2 mg/dL — AB (ref 0.2–1.2)
BUN: 26 mg/dL — AB (ref 6–23)
CO2: 33 mEq/L — ABNORMAL HIGH (ref 19–32)
CREATININE: 1.02 mg/dL (ref 0.40–1.20)
Calcium: 8.6 mg/dL (ref 8.4–10.5)
Chloride: 104 mEq/L (ref 96–112)
GFR: 56.26 mL/min — ABNORMAL LOW (ref >60.00)
GLUCOSE: 179 mg/dL — AB (ref 70–99)
Potassium: 5.3 mEq/L — ABNORMAL HIGH (ref 3.5–5.1)
SODIUM: 140 meq/L (ref 135–145)
TOTAL PROTEIN: 5.5 g/dL — AB (ref 6.0–8.3)

## 2017-07-14 LAB — BRAIN NATRIURETIC PEPTIDE: PRO B NATRI PEPTIDE: 265 pg/mL — AB (ref 0.0–100.0)

## 2017-07-14 MED ORDER — DOXYCYCLINE HYCLATE 100 MG PO TABS: 100.0000 mg | ORAL_TABLET | Freq: Two times a day (BID) | ORAL | 0 refills | Status: DC

## 2017-07-14 NOTE — Patient Outreach (Signed)
[facility]) Care Management  07/14/2017  Ashley Savage 1940/12/16 [id number]   Called patient back to follow up on Spironolactone dose. Unfortunately, patient did not answer the phone. HIPAA compliant message was left on her voicemail.  Patient had labs drawn today, K down to 5.3 from 5.8  Plan:  Call patient back in 1-3 business days.   [name], PharmD, [place] Clinical Pharmacist [phone]

## 2017-07-14 NOTE — Telephone Encounter (Signed)
Dr. Charlett Blake,  I saw [facility] today for 2-week follow-up post hospitalization for possible COPD exacerbation and pneumonia.  X-ray prior to hospitalization never showed pneumonia but white count was 20,000 lactic acid was elevated.  They put her on vancomycin and IV cephalosporin.  She also got   prednisone in the hospital.  At discharge they gave her Augmentin, prednisone and O2 at 2 L.  Came in for follow-up without her oxygen her oxygen was hovering as needed 80s initially stayed around 91-92%.  Again this was without oxygen.  Her WBC today was 10 but her x-ray showed   FINDINGS: Left ventricular prominence. Aortic atherosclerosis. Small amount of pleural fluid on the right. Patchy infiltrate in volume loss in both mid and lower lungs, right worse than left. Central bronchial thickening. Biapical scarring.  IMPRESSION: Patchy areas of density and volume loss in both mid and lower lungs worrisome for pneumonia. Small amount of pleural fluid on the right.   If you would review my note that I sent you.  I stressed using the oxygen, her stiolto, and her neb machine.  With her underlying condition of COPD, Ashley Savage and insulin-dependent diabetes, along with the above finding I think she would likely benefit from Levaquin or readmission for IV antibiotics.  Overall patient looks stable but have concern how things go.  I am hesitant to write Levaquin since on her allergy list states Cipro altered her mental status.  Overall her bilateral lung findings are also major concern of mine.  I am going to call her tonight and advised her if she feels worse to go to the emergency department.  Considered giving her a combination of Augmentin and a azithromycin but she just finished Augmentin.  All also advised her to go ahead and start doxycycline and take the first dose tonight.  Then try to talk with you in the morning  Thanks, Ashley Savage, Percell Miller, [place]

## 2017-07-14 NOTE — Patient Instructions (Addendum)
Your oxygen is low today but you have COPD and have recently began O2 at 2 L.  You were instructed to use it all the time.  Presently the low reading in our office is without oxygen.  So please start using oxygen daily.  Also follow pulmonologist guidelines and use your inhalers.  We need to get a chest x-ray today and make sure that looks clear/no changes from most recent hospital chest x-ray.  Also I will repeat blood work to evaluate your WBCs which were quite high prior to admission but then very close to normal on discharge.  With your pedal edema today and history of low oxygen, I will include BNP protein to evaluate if potentially any CHF present.  Also will get metabolic panel and carbon copy those results to your gastroenterologist which has been making some dosing modifications with your Lasix and spironolactone.  We need to know what your electrolyte values are.  Please continue your Levemir and NovoLog.  I think your sugars will improve now that you are off prednisone.   Follow-up with your regular PCP as routinely scheduled.  As needed with myself as well.  After review of labs and imaging might give a specific date for follow-up as well.

## 2017-07-14 NOTE — Telephone Encounter (Signed)
Rx doxycycline sent to patient's pharmacy. 

## 2017-07-14 NOTE — Telephone Encounter (Signed)
I would make sure she knows to go to ER if she gets worse but since she is improving I think Levaquin 250 mg qod x 10 days would be reasonable. Her MS change with ciprofloxacin was likely her liver disease and lactic acid. Just warn her that Levaquin is related to ciprofloxacin so if she has any trouble she should stop it.

## 2017-07-14 NOTE — Progress Notes (Addendum)
Subjective:    [name], female    DOB: [birth year], 76 y.o.   MRN: [id number]  HPI  Pt in for a follow up.   On initial evaluation pulse of 82% noted. When she dozed off dropped to 79-80% dropped but then came up to 92% when she woke up. (Eventually leveled out at about 91%). Stopped smoking 6 months ago. Smoked for 60 years.  Note she dozed off early in interview as I was reviewing hospital notes.(but came to when I woke her and was alert entire rest of exam).   Pt dc from hospital  since 5th of December and admitted on November 30th. Pt went to GI on 29th and her wbc was high at 20,000. Then told to go to ED. She ws then admitted. Treated with vancomycin and cefepime for looks like concern for pneumonia. She was discharged and her wbc was 11,000. They gave her prednisone and aguementin on DC.   Pt states she swells with prednisone(finished this weekend). Pt gastroenterologist has been advising pt on diruectic dosing of lasix and spirinolactone. Pt has nash  Pt levimir has been increased to 17 units at night and on novolog sliding scales.  Pt just placed on oxygen 2 liters. She was discharged on oxygen. This is new for her. Hospital told her to use it all the time. She states does not like to use it all the time. On and off use around house. She has not used it today.(no 02 used in office/she left it at home)  Pt cough very seldom. With oxygen keeps her form gasping. No fever or sweats since discharge. She states chronically feels cold for years.     Review of Systems  Constitutional: Negative for chills, fatigue and fever.  Respiratory: Positive for cough. Negative for chest tightness, shortness of breath and wheezing.        Dyspnea with activity.  Cardiovascular: Negative for chest pain and palpitations.  Musculoskeletal: Negative for back pain.       Some leg and hand edema. 1+(decreasing since stopped prednisone)  Skin: Negative for color change and rash.    Neurological: Negative for dizziness, seizures, speech difficulty, numbness and headaches.  Hematological: Negative for adenopathy. Does not bruise/bleed easily.  Psychiatric/Behavioral: Negative for confusion.   Past Medical History:  Diagnosis Date  . Abdominal aortic aneurysm ([place]) 10/01/2013   Fall of 2014 3.3 per patient, follows with Vascular surgeon.   . Acute respiratory failure with hypoxia ([place]) 07/31/2015  . Allergic state 11/10/2016  . Anxiety   . Anxiety and depression 02/01/2014  . Arthritis of both knees 10/01/2013  . Arthritis of right knee 10/01/2013   Follows with Dr Mayer Camel   . Benign paroxysmal positional vertigo 10/01/2013  . Breast cancer ([place])    No disease activity On Femara Follows with Dr Marin Olp Right mastectomy performed by Dr Autumn Messing   . Cancer [facility]) breast ca  right  . Chronic respiratory failure ([place]) 08/29/2015  . Cirrhosis of liver without ascites ([place]) 07/31/2015  . COPD (chronic obstructive pulmonary disease) ([place]) 10/01/2013  . COPD with acute exacerbation ([place]) 05/05/2017  . Depression   . Dermatitis 03/30/2017  . Diabetes mellitus type 2  . Diabetes mellitus type 2, controlled ([place]) 10/16/2014  . Emphysema   . Encephalopathy, hepatic ([place]) 06/07/2014  . Esophageal reflux 10/01/2013  . Fall 07/30/2016  . Hyperlipidemia   . Hyperlipidemia, mixed   . Increased ammonia level 11/25/2014  . NASH (nonalcoholic  steatohepatitis) 08/26/2015  . Neck pain 10/01/2013  . Neuropathy    feet   . Osteopenia 03/30/2017  . Overactive bladder 12/10/2013  . Panic attacks   . Pedal edema 12/10/2013  . Personal history of radiation therapy   . Preventative health care 03/08/2016  . Thrombocytopenia ([place]) 03/17/2012  . Tobacco abuse disorder 02/01/2014  . Type 2 diabetes mellitus with hyperglycemia, with long-term current use of insulin ([place])   . Urine frequency 04/13/2017     Social History   Socioeconomic History  . Marital status: Single    Spouse name: Not on file  . Number of  children: 0  . Years of education: Not on file  . Highest education level: Not on file  Social Needs  . Financial resource strain: Not on file  . Food insecurity - worry: Not on file  . Food insecurity - inability: Not on file  . Transportation needs - medical: Not on file  . Transportation needs - non-medical: Not on file  Occupational History  . Occupation: retired    Fish farm manager: RETIRED  Tobacco Use  . Smoking status: Current Every Day Smoker    Packs/day: 0.25    Years: 58.00    Pack years: 14.50    Types: Cigarettes    Start date: 07/27/1964  . Smokeless tobacco: Never Used  Substance and Sexual Activity  . Alcohol use: No    Alcohol/week: 0.0 oz  . Drug use: No  . Sexual activity: No    Birth control/protection: Post-menopausal  Other Topics Concern  . Not on file  Social History Narrative   Lives alone, continues to smoke, no dietary restrictions    Past Surgical History:  Procedure Laterality Date  . APPENDECTOMY  2007  . BREAST SURGERY  2009 right  . CATARACT EXTRACTION     x 2  . ESOPHAGOGASTRODUODENOSCOPY (EGD) WITH PROPOFOL N/A 08/14/2016   Procedure: ESOPHAGOGASTRODUODENOSCOPY (EGD) WITH PROPOFOL;  Surgeon: Mauri Pole, MD;  Location: [facility];  Service: Endoscopy;  Laterality: N/A;  . [place]  . KNEE SURGERY    . MANDIBLE FRACTURE SURGERY    . MASTECTOMY    . PILONIDAL CYST EXCISION    . TONSILLECTOMY      Family History  Problem Relation Age of Onset  . Heart failure Father   . COPD Father   . Arthritis Father 3  . Stroke Mother   . Arthritis Mother 89  . Hyperlipidemia Mother   . Hypertension Mother   . Diabetes Mother   . Diabetes Sister   . Breast cancer Unknown   . Breast cancer Maternal Aunt   . Asthma Maternal Aunt   . Birth defects Maternal Aunt   . Alcohol abuse Maternal Uncle   . Breast cancer Maternal Aunt   . Stomach cancer Neg Hx   . Colon cancer Neg Hx     Allergies  Allergen Reactions  .  Citalopram Palpitations    Irregular heart beat  . Ciprofloxacin     Mental status change  . Erythromycin Other (See Comments)    Stomach cramps  . Glimepiride     Elevated ammonia levels  . Prednisone     Increased blood sugars too high    Current Outpatient Medications on File Prior to Visit  Medication Sig Dispense Refill  . albuterol (ACCUNEB) 1.25 MG/3ML nebulizer solution Take 3 mLs (1.25 mg total) by nebulization every 6 (six) hours as needed for wheezing. 75 mL 12  .  Cholecalciferol (VITAMIN D3) 2000 units TABS Take 2,000 Units by mouth every morning.    . furosemide (LASIX) 40 MG tablet Take 1.5 tablets (60 mg total) by mouth daily. (Patient taking differently: Take 40 mg by mouth daily. ) 30 tablet 3  . guaiFENesin (MUCINEX) 600 MG 12 hr tablet Take 2 tablets (1,200 mg total) by mouth 2 (two) times daily as needed for to loosen phlegm.    . insulin aspart (NOVOLOG FLEXPEN) 100 UNIT/ML FlexPen Sliding scale 121-150 - 2 units; 151-200 - 3 units; 201-250 -5 units; 251-300 - 8 units; 301-350 - 11units; 351-400 - 15 unitsc351-400 -15 units; greater than 400 -15 units and call MD. 3 mL 0  . insulin detemir (LEVEMIR) 100 UNIT/ML injection Inject 0.17 mLs (17 Units total) into the skin daily at 10 pm. 10 mL 11  . lactulose (CHRONULAC) 10 GM/15ML solution TAKE 45 ML BY MOUTH TWICE DAILY AS NEEDED FOR MILD CONSTIPATION 8121 mL 2  . mometasone (NASONEX) 50 MCG/ACT nasal spray Place 2 sprays into the nose daily. 17 g 12  . omeprazole (PRILOSEC) 20 MG capsule TAKE 1 CAPSULE(20 MG) BY MOUTH DAILY 90 capsule 0  . polyvinyl alcohol (LUBRICANT DROPS) 1.4 % ophthalmic solution Place 1 drop into both eyes as needed (dry eyes). 15 mL 0  . predniSONE (DELTASONE) 10 MG tablet 40 mg po daily x 2 daily then 30 mg po daily x 2 day then20 mg po daily x 2 day then 10 mg daily x 2 day then stop 20 tablet 0  . rifaximin (XIFAXAN) 550 MG TABS tablet Take 1 tablet (550 mg total) by mouth 2 (two) times daily. 60  tablet 11  . spironolactone (ALDACTONE) 100 MG tablet Take 1.5 tablets (150 mg total) by mouth daily. (Patient taking differently: Take 100 mg by mouth daily. ) 30 tablet 3  . Tiotropium Bromide-Olodaterol (STIOLTO RESPIMAT) 2.5-2.5 MCG/ACT AERS Inhale 2 puffs into the lungs daily. 3 Inhaler 3   No current facility-administered medications on file prior to visit.     BP (!) 107/33   Pulse 63   Temp 97.6 F (36.4 C) (Oral)   Resp 16   Ht 5\' 5"  (1.651 m)   Wt 176 lb (79.8 kg)   LMP  (LMP Unknown)   SpO2 93%   BMI 29.29 kg/m       Objective:   Physical Exam  General Mental Status- Alert. General Appearance- Not in acute distress.   Skin General: Color- Normal Color. Moisture- Normal Moisture.  Neck Carotid Arteries- Normal color. Moisture- Normal Moisture. No carotid bruits. No JVD.  Chest and Lung Exam Auscultation: Breath Sounds:-Normal.  Cardiovascular Auscultation:Rythm- Regular. Murmurs & Other Heart Sounds:Auscultation of the heart reveals- No Murmurs.  Abdomen Inspection:-Inspeection Normal. Palpation/Percussion:Note:No mass. Palpation and Percussion of the abdomen reveal- Non Tender, Non Distended + BS, no rebound or guarding.    Neurologic Cranial Nerve exam:- CN III-XII intact(No nystagmus), symmetric smile. Strength:- 5/5 equal and symmetric strength both upper and lower extremities.  Lower ext- 1+ edema bilateral. Negative homans signs bilateral.      Assessment & Plan:  Your oxygen is low today but you have COPD and have recently began O2 at 2 L.  You were instructed to use it all the time.  Presently the low reading in our office is without oxygen.  So please start using oxygen daily.  Also follow pulmonologist guidelines and use your inhalers.  We need to get a chest x-ray today and make sure  that looks clear/no changes from most recent hospital chest x-ray.  Also I will repeat blood work to evaluate your WBCs which were quite high prior to  admission but then very close to normal on discharge.  With your pedal edema today and history of low oxygen, I will include BNP protein to evaluate if potentially any CHF present.  Also will get metabolic panel and carbon copy those results to your gastroenterologist which has been making some dosing modifications with your Lasix and spironolactone.  We need to know what your electrolyte values are.  Please continue your Levemir and NovoLog.  I think your sugars will improve now that you are off prednisone.   Follow-up with your regular PCP as routinely scheduled.  As needed with myself as well.  After review of labs and imaging might give a specific date for follow-up as well.  Matty Deamer, Percell Miller, PA-C

## 2017-07-15 ENCOUNTER — Encounter: Payer: Self-pay | Admitting: Medical

## 2017-07-15 ENCOUNTER — Telehealth: Payer: Self-pay | Admitting: Medical

## 2017-07-15 DIAGNOSIS — J189 Pneumonia, unspecified organism: Secondary | ICD-10-CM | POA: Diagnosis not present

## 2017-07-15 MED ORDER — CIPROFLOXACIN HCL 250 MG PO TABS: ORAL_TABLET | ORAL | 0 refills | Status: DC

## 2017-07-15 MED ORDER — LEVOFLOXACIN 250 MG PO TABS: ORAL_TABLET | ORAL | 0 refills | Status: DC

## 2017-07-15 NOTE — Telephone Encounter (Signed)
I tried to call patient and speak with her again.  She did not pick up so had to leave a message.  I did advise her that the message not to take the doxycycline but to take levofloxacin instead.  Will be every every other day for the next 10 days.  She has any side effects stop it and let us know.  If she gets worse advised her to go to the emergency department.  Did explain that levofloxacin is similar to Cipro but that Dr. Charlett Blake think she will do better with levofloxacin.  I am going to ask she has been to try to call patient and conveyed this message as well/ try to talk to [facility].

## 2017-07-15 NOTE — Telephone Encounter (Signed)
I called patient and let her know that I accidentally sent in Cipro.  I resent levofloxacin and she is going to pick that up.  If she will not take the Cipro.  I asked her to call me if pharmacy has any questions or issues on feeling the levofloxacin.

## 2017-07-15 NOTE — Telephone Encounter (Signed)
Notified pt medication change.

## 2017-07-15 NOTE — Telephone Encounter (Signed)
Copied from [place] [id number]. Topic: Quick Communication - See Telephone Encounter >> Jul 15, 2017  4:57 PM Bea Graff, NT wrote: CRM for notification. See Telephone encounter for: Pt says that the doctor called in Cipro for her and this is on her allergies. Please call pt. I did inform her to not take medication.   07/15/17.

## 2017-07-15 NOTE — Addendum Note (Signed)
Addended by: Anabel Halon on: 07/15/2017 08:25 AM   Modules accepted: Orders

## 2017-07-15 NOTE — Telephone Encounter (Signed)
I tried to call patient and speak with her again.  She did not pick up so had to leave a message.  I did advise her that the message not to take the doxycycline but to take levofloxacin instead.  Will be every every other day for the next 10 days.  She has any side effects stop it and let us know.  If she gets worse advised her to go to the emergency department.  Did explain that levofloxacin is similar to Cipro but that Dr. Charlett Blake think she will do better with levofloxacin.  I am going to ask she has been to try to call patient and conveyed this message as well/ try to talk to [facility].

## 2017-07-16 ENCOUNTER — Other Ambulatory Visit: Payer: Self-pay | Admitting: Pharmacist

## 2017-07-16 DIAGNOSIS — Z7951 Long term (current) use of inhaled steroids: Secondary | ICD-10-CM | POA: Diagnosis not present

## 2017-07-16 DIAGNOSIS — K7460 Unspecified cirrhosis of liver: Secondary | ICD-10-CM | POA: Diagnosis not present

## 2017-07-16 DIAGNOSIS — E119 Type 2 diabetes mellitus without complications: Secondary | ICD-10-CM | POA: Diagnosis not present

## 2017-07-16 DIAGNOSIS — E785 Hyperlipidemia, unspecified: Secondary | ICD-10-CM | POA: Diagnosis not present

## 2017-07-16 DIAGNOSIS — F17210 Nicotine dependence, cigarettes, uncomplicated: Secondary | ICD-10-CM | POA: Diagnosis not present

## 2017-07-16 DIAGNOSIS — Z853 Personal history of malignant neoplasm of breast: Secondary | ICD-10-CM | POA: Diagnosis not present

## 2017-07-16 DIAGNOSIS — J441 Chronic obstructive pulmonary disease with (acute) exacerbation: Secondary | ICD-10-CM | POA: Diagnosis not present

## 2017-07-16 DIAGNOSIS — J440 Chronic obstructive pulmonary disease with acute lower respiratory infection: Secondary | ICD-10-CM | POA: Diagnosis not present

## 2017-07-16 DIAGNOSIS — Z794 Long term (current) use of insulin: Secondary | ICD-10-CM | POA: Diagnosis not present

## 2017-07-16 DIAGNOSIS — J209 Acute bronchitis, unspecified: Secondary | ICD-10-CM | POA: Diagnosis not present

## 2017-07-16 DIAGNOSIS — F329 Major depressive disorder, single episode, unspecified: Secondary | ICD-10-CM | POA: Diagnosis not present

## 2017-07-16 DIAGNOSIS — K7581 Nonalcoholic steatohepatitis (NASH): Secondary | ICD-10-CM | POA: Diagnosis not present

## 2017-07-16 DIAGNOSIS — Z9011 Acquired absence of right breast and nipple: Secondary | ICD-10-CM | POA: Diagnosis not present

## 2017-07-16 DIAGNOSIS — F419 Anxiety disorder, unspecified: Secondary | ICD-10-CM | POA: Diagnosis not present

## 2017-07-16 DIAGNOSIS — Z8679 Personal history of other diseases of the circulatory system: Secondary | ICD-10-CM | POA: Diagnosis not present

## 2017-07-16 DIAGNOSIS — Z9981 Dependence on supplemental oxygen: Secondary | ICD-10-CM | POA: Diagnosis not present

## 2017-07-16 DIAGNOSIS — H8110 Benign paroxysmal vertigo, unspecified ear: Secondary | ICD-10-CM | POA: Diagnosis not present

## 2017-07-16 NOTE — Patient Outreach (Signed)
[facility]) Care Management  07/16/2017  Ashley Savage 03/02/1941 [id number]   Called patient to follow up on post discharge medication review. HIPAA identifiers were obtained. Patient confirmed she is taking Spironolactone 100mg  1 tablet daily but is taking Furosemide 40mg  1&1/2 tablets daily.  Patient also shared that she was started on levofloxacin 250mg  1 tablet every other day for 10 days due to chest congestion.  Plan: Close pharmacy case as her 30 day post discharge was completed and the questions about her medications from the review have been answered.  Patient is still being followed by [facility] Nurse, [name].  I will send Lattie Haw a message to let her know the patient's pharmacy case will be closed.  [name], PharmD, [place] Clinical Pharmacist [phone]

## 2017-07-18 DIAGNOSIS — E875 Hyperkalemia: Secondary | ICD-10-CM | POA: Diagnosis not present

## 2017-07-18 DIAGNOSIS — J690 Pneumonitis due to inhalation of food and vomit: Secondary | ICD-10-CM | POA: Diagnosis not present

## 2017-07-18 DIAGNOSIS — R609 Edema, unspecified: Secondary | ICD-10-CM | POA: Diagnosis not present

## 2017-07-18 DIAGNOSIS — Z794 Long term (current) use of insulin: Secondary | ICD-10-CM | POA: Diagnosis not present

## 2017-07-18 DIAGNOSIS — R740 Nonspecific elevation of levels of transaminase and lactic acid dehydrogenase [LDH]: Secondary | ICD-10-CM | POA: Diagnosis not present

## 2017-07-18 DIAGNOSIS — J9600 Acute respiratory failure, unspecified whether with hypoxia or hypercapnia: Secondary | ICD-10-CM | POA: Diagnosis not present

## 2017-07-18 DIAGNOSIS — R000 Tachycardia, unspecified: Secondary | ICD-10-CM | POA: Diagnosis not present

## 2017-07-18 DIAGNOSIS — R05 Cough: Secondary | ICD-10-CM | POA: Diagnosis not present

## 2017-07-18 DIAGNOSIS — E872 Acidosis: Secondary | ICD-10-CM | POA: Diagnosis not present

## 2017-07-18 DIAGNOSIS — R918 Other nonspecific abnormal finding of lung field: Secondary | ICD-10-CM | POA: Diagnosis not present

## 2017-07-18 DIAGNOSIS — R2681 Unsteadiness on feet: Secondary | ICD-10-CM | POA: Diagnosis not present

## 2017-07-18 DIAGNOSIS — I493 Ventricular premature depolarization: Secondary | ICD-10-CM | POA: Diagnosis not present

## 2017-07-18 DIAGNOSIS — R0602 Shortness of breath: Secondary | ICD-10-CM | POA: Diagnosis not present

## 2017-07-18 DIAGNOSIS — R091 Pleurisy: Secondary | ICD-10-CM | POA: Diagnosis not present

## 2017-07-18 DIAGNOSIS — D72829 Elevated white blood cell count, unspecified: Secondary | ICD-10-CM | POA: Diagnosis not present

## 2017-07-18 DIAGNOSIS — I8500 Esophageal varices without bleeding: Secondary | ICD-10-CM | POA: Diagnosis not present

## 2017-07-18 DIAGNOSIS — D696 Thrombocytopenia, unspecified: Secondary | ICD-10-CM | POA: Diagnosis not present

## 2017-07-18 DIAGNOSIS — R1312 Dysphagia, oropharyngeal phase: Secondary | ICD-10-CM | POA: Diagnosis not present

## 2017-07-18 DIAGNOSIS — E118 Type 2 diabetes mellitus with unspecified complications: Secondary | ICD-10-CM | POA: Diagnosis not present

## 2017-07-18 DIAGNOSIS — K7460 Unspecified cirrhosis of liver: Secondary | ICD-10-CM | POA: Diagnosis not present

## 2017-07-18 DIAGNOSIS — N186 End stage renal disease: Secondary | ICD-10-CM | POA: Diagnosis not present

## 2017-07-18 DIAGNOSIS — J441 Chronic obstructive pulmonary disease with (acute) exacerbation: Secondary | ICD-10-CM | POA: Diagnosis not present

## 2017-07-18 DIAGNOSIS — I350 Nonrheumatic aortic (valve) stenosis: Secondary | ICD-10-CM | POA: Diagnosis not present

## 2017-07-18 DIAGNOSIS — I5030 Unspecified diastolic (congestive) heart failure: Secondary | ICD-10-CM | POA: Diagnosis not present

## 2017-07-18 DIAGNOSIS — I2609 Other pulmonary embolism with acute cor pulmonale: Secondary | ICD-10-CM | POA: Diagnosis not present

## 2017-07-18 DIAGNOSIS — K7581 Nonalcoholic steatohepatitis (NASH): Secondary | ICD-10-CM | POA: Diagnosis not present

## 2017-07-18 DIAGNOSIS — Z87891 Personal history of nicotine dependence: Secondary | ICD-10-CM | POA: Diagnosis not present

## 2017-07-18 DIAGNOSIS — J90 Pleural effusion, not elsewhere classified: Secondary | ICD-10-CM | POA: Diagnosis not present

## 2017-07-18 DIAGNOSIS — E8770 Fluid overload, unspecified: Secondary | ICD-10-CM | POA: Diagnosis not present

## 2017-07-18 DIAGNOSIS — R601 Generalized edema: Secondary | ICD-10-CM | POA: Diagnosis not present

## 2017-07-18 DIAGNOSIS — Z4901 Encounter for fitting and adjustment of extracorporeal dialysis catheter: Secondary | ICD-10-CM | POA: Diagnosis not present

## 2017-07-18 DIAGNOSIS — I5033 Acute on chronic diastolic (congestive) heart failure: Secondary | ICD-10-CM | POA: Diagnosis not present

## 2017-07-18 DIAGNOSIS — E119 Type 2 diabetes mellitus without complications: Secondary | ICD-10-CM | POA: Diagnosis not present

## 2017-07-18 DIAGNOSIS — N390 Urinary tract infection, site not specified: Secondary | ICD-10-CM | POA: Diagnosis not present

## 2017-07-18 DIAGNOSIS — R7989 Other specified abnormal findings of blood chemistry: Secondary | ICD-10-CM | POA: Diagnosis not present

## 2017-07-18 DIAGNOSIS — Z9981 Dependence on supplemental oxygen: Secondary | ICD-10-CM | POA: Diagnosis not present

## 2017-07-18 DIAGNOSIS — J449 Chronic obstructive pulmonary disease, unspecified: Secondary | ICD-10-CM | POA: Diagnosis not present

## 2017-07-18 DIAGNOSIS — J9622 Acute and chronic respiratory failure with hypercapnia: Secondary | ICD-10-CM | POA: Diagnosis not present

## 2017-07-18 DIAGNOSIS — N19 Unspecified kidney failure: Secondary | ICD-10-CM | POA: Diagnosis not present

## 2017-07-18 DIAGNOSIS — R0902 Hypoxemia: Secondary | ICD-10-CM | POA: Diagnosis not present

## 2017-07-18 DIAGNOSIS — K7469 Other cirrhosis of liver: Secondary | ICD-10-CM | POA: Diagnosis not present

## 2017-07-18 DIAGNOSIS — J80 Acute respiratory distress syndrome: Secondary | ICD-10-CM | POA: Diagnosis not present

## 2017-07-18 DIAGNOSIS — J181 Lobar pneumonia, unspecified organism: Secondary | ICD-10-CM | POA: Diagnosis not present

## 2017-07-18 DIAGNOSIS — J9621 Acute and chronic respiratory failure with hypoxia: Secondary | ICD-10-CM | POA: Diagnosis not present

## 2017-07-18 DIAGNOSIS — E1143 Type 2 diabetes mellitus with diabetic autonomic (poly)neuropathy: Secondary | ICD-10-CM | POA: Diagnosis not present

## 2017-07-18 DIAGNOSIS — J189 Pneumonia, unspecified organism: Secondary | ICD-10-CM | POA: Diagnosis not present

## 2017-07-18 DIAGNOSIS — J9601 Acute respiratory failure with hypoxia: Secondary | ICD-10-CM | POA: Diagnosis not present

## 2017-07-18 DIAGNOSIS — K3189 Other diseases of stomach and duodenum: Secondary | ICD-10-CM | POA: Diagnosis not present

## 2017-07-18 DIAGNOSIS — K521 Toxic gastroenteritis and colitis: Secondary | ICD-10-CM | POA: Diagnosis not present

## 2017-07-18 DIAGNOSIS — I517 Cardiomegaly: Secondary | ICD-10-CM | POA: Diagnosis not present

## 2017-07-18 DIAGNOSIS — M6281 Muscle weakness (generalized): Secondary | ICD-10-CM | POA: Diagnosis not present

## 2017-07-18 DIAGNOSIS — J9612 Chronic respiratory failure with hypercapnia: Secondary | ICD-10-CM | POA: Diagnosis not present

## 2017-07-18 DIAGNOSIS — R9431 Abnormal electrocardiogram [ECG] [EKG]: Secondary | ICD-10-CM | POA: Diagnosis not present

## 2017-07-18 DIAGNOSIS — Z515 Encounter for palliative care: Secondary | ICD-10-CM | POA: Diagnosis not present

## 2017-07-18 DIAGNOSIS — I5032 Chronic diastolic (congestive) heart failure: Secondary | ICD-10-CM | POA: Diagnosis not present

## 2017-07-18 DIAGNOSIS — R222 Localized swelling, mass and lump, trunk: Secondary | ICD-10-CM | POA: Diagnosis not present

## 2017-07-18 DIAGNOSIS — E11649 Type 2 diabetes mellitus with hypoglycemia without coma: Secondary | ICD-10-CM | POA: Diagnosis not present

## 2017-07-18 DIAGNOSIS — D649 Anemia, unspecified: Secondary | ICD-10-CM | POA: Diagnosis not present

## 2017-07-18 DIAGNOSIS — K219 Gastro-esophageal reflux disease without esophagitis: Secondary | ICD-10-CM | POA: Diagnosis not present

## 2017-07-18 DIAGNOSIS — R748 Abnormal levels of other serum enzymes: Secondary | ICD-10-CM | POA: Diagnosis not present

## 2017-07-18 DIAGNOSIS — J439 Emphysema, unspecified: Secondary | ICD-10-CM | POA: Diagnosis not present

## 2017-07-18 DIAGNOSIS — I10 Essential (primary) hypertension: Secondary | ICD-10-CM | POA: Diagnosis not present

## 2017-07-18 DIAGNOSIS — N179 Acute kidney failure, unspecified: Secondary | ICD-10-CM | POA: Diagnosis not present

## 2017-07-18 DIAGNOSIS — E1165 Type 2 diabetes mellitus with hyperglycemia: Secondary | ICD-10-CM | POA: Diagnosis not present

## 2017-07-18 DIAGNOSIS — B9689 Other specified bacterial agents as the cause of diseases classified elsewhere: Secondary | ICD-10-CM | POA: Diagnosis not present

## 2017-07-19 ENCOUNTER — Other Ambulatory Visit: Payer: Self-pay | Admitting: *Deleted

## 2017-07-19 NOTE — Patient Outreach (Signed)
[facility]) Care Management  07/19/2017  BETTEY MURAOKA 06-30-1941 [id number]    Transition of care  RN attempted outreach call today however unsuccessful but RN able to leave a HIPAA approved voice message requesting a call back. Will continue outreach calls accordingly (next week) on pt's process with managing her acute medical conditions.  [name], RN Care Management Coordinator [place] Office [phone]

## 2017-07-23 ENCOUNTER — Ambulatory Visit: Payer: Self-pay | Admitting: *Deleted

## 2017-07-23 ENCOUNTER — Other Ambulatory Visit: Payer: Self-pay | Admitting: *Deleted

## 2017-07-23 NOTE — Patient Outreach (Signed)
[facility]) Care Management  07/23/2017  [name] [birth year] [id number]    Transition of care call unsuccessful today however RN able to leave a HIPAA approved voice message requesting a call back. Will rescheduled another follow up call next week accordingly.  [name], RN Care Management Coordinator [place] Office [phone]

## 2017-07-27 ENCOUNTER — Encounter: Payer: Self-pay | Admitting: Family Medicine

## 2017-07-29 ENCOUNTER — Ambulatory Visit: Payer: Self-pay | Admitting: *Deleted

## 2017-07-29 MED ORDER — UMECLIDINIUM-VILANTEROL 62.5-25 MCG/INH IN AEPB
1.00 | INHALATION_SPRAY | RESPIRATORY_TRACT | Status: DC
Start: 2017-08-10 — End: 2017-07-29

## 2017-07-29 MED ORDER — INSULIN LISPRO 100 UNIT/ML ~~LOC~~ SOLN
2.00 | SUBCUTANEOUS | Status: DC
Start: 2017-08-09 — End: 2017-07-29

## 2017-07-29 MED ORDER — GENERIC EXTERNAL MEDICATION
1.00 | Status: DC
Start: ? — End: 2017-07-29

## 2017-07-29 MED ORDER — GENERIC EXTERNAL MEDICATION
5.00 | Status: DC
Start: ? — End: 2017-07-29

## 2017-07-29 MED ORDER — DEXTROSE 50 % IV SOLN
12.00 g | INTRAVENOUS | Status: DC
Start: ? — End: 2017-07-29

## 2017-07-29 MED ORDER — PREDNISONE 20 MG PO TABS
20.00 | ORAL_TABLET | ORAL | Status: DC
Start: 2017-07-30 — End: 2017-07-29

## 2017-07-29 MED ORDER — LACTULOSE 10 GM/15ML PO SOLN
20.00 g | ORAL | Status: DC
Start: 2017-08-10 — End: 2017-07-29

## 2017-07-29 MED ORDER — PANTOPRAZOLE SODIUM 40 MG PO TBEC
40.00 | DELAYED_RELEASE_TABLET | ORAL | Status: DC
Start: 2017-08-10 — End: 2017-07-29

## 2017-07-29 MED ORDER — ALBUTEROL SULFATE (2.5 MG/3ML) 0.083% IN NEBU
2.50 | INHALATION_SOLUTION | RESPIRATORY_TRACT | Status: DC
Start: 2017-07-29 — End: 2017-07-29

## 2017-07-29 MED ORDER — GUAIFENESIN ER 600 MG PO TB12
800.00 | ORAL_TABLET | ORAL | Status: DC
Start: 2017-08-09 — End: 2017-07-29

## 2017-07-29 MED ORDER — GLUCOSE 40 % PO GEL
15.00 g | ORAL | Status: DC
Start: ? — End: 2017-07-29

## 2017-07-29 MED ORDER — ALBUTEROL SULFATE (2.5 MG/3ML) 0.083% IN NEBU
2.50 | INHALATION_SOLUTION | RESPIRATORY_TRACT | Status: DC
Start: ? — End: 2017-07-29

## 2017-07-29 MED ORDER — FLUTICASONE FUROATE-VILANTEROL 100-25 MCG/INH IN AEPB
1.00 | INHALATION_SPRAY | RESPIRATORY_TRACT | Status: DC
Start: 2017-08-10 — End: 2017-07-29

## 2017-07-29 MED ORDER — GENERIC EXTERNAL MEDICATION
10.00 | Status: DC
Start: 2017-07-29 — End: 2017-07-29

## 2017-07-29 MED ORDER — FLUTICASONE PROPIONATE 50 MCG/ACT NA SUSP
1.00 | NASAL | Status: DC
Start: 2017-08-10 — End: 2017-07-29

## 2017-07-29 MED ORDER — SODIUM CHLORIDE 0.65 % NA SOLN
1.00 | NASAL | Status: DC
Start: ? — End: 2017-07-29

## 2017-07-29 MED ORDER — PHENYLEPHRINE-MINERAL OIL-PET 0.25-14-74.9 % RE OINT
1.00 | TOPICAL_OINTMENT | RECTAL | Status: DC
Start: ? — End: 2017-07-29

## 2017-07-29 MED ORDER — RIFAXIMIN 550 MG PO TABS
550.00 | ORAL_TABLET | ORAL | Status: DC
Start: 2017-08-09 — End: 2017-07-29

## 2017-07-29 MED ORDER — TORSEMIDE 10 MG PO TABS
40.00 | ORAL_TABLET | ORAL | Status: DC
Start: 2017-07-30 — End: 2017-07-29

## 2017-07-29 MED ORDER — ACETAMINOPHEN 325 MG PO TABS
650.00 | ORAL_TABLET | ORAL | Status: DC
Start: ? — End: 2017-07-29

## 2017-07-30 ENCOUNTER — Telehealth: Payer: Self-pay | Admitting: *Deleted

## 2017-07-30 NOTE — Telephone Encounter (Signed)
Received Authorization Confirmation from [place] with [place] for [place] [COPD] and Physical Therapy; forwarded to provider/SLS 01/04

## 2017-08-02 ENCOUNTER — Encounter: Payer: Self-pay | Admitting: Gastroenterology

## 2017-08-03 ENCOUNTER — Telehealth: Payer: Self-pay

## 2017-08-03 ENCOUNTER — Other Ambulatory Visit: Payer: Self-pay | Admitting: *Deleted

## 2017-08-03 ENCOUNTER — Encounter: Payer: Self-pay | Admitting: *Deleted

## 2017-08-03 NOTE — Patient Outreach (Signed)
[facility]) Care Management  08/03/2017  Ashley Savage Jun 07, 1941 [id number]   Pt has been hospitalized over 10 days. Therefore per policy this case will be closed and the hospital liaison will be notified [name]). Note pt admitted to [facility]) since 07/18/2017. Provider and CMA will be notified.  [name], RN Care Management Coordinator [place] Office [phone]

## 2017-08-03 NOTE — Telephone Encounter (Signed)
Called patient at home, unable to reach patient so left a voice message. I let her know we are following up on the MyChart message she sent, wanted to make sure she was okay. Asked her to call the office back.

## 2017-08-04 ENCOUNTER — Encounter: Payer: Self-pay | Admitting: *Deleted

## 2017-08-09 ENCOUNTER — Telehealth: Payer: Self-pay | Admitting: *Deleted

## 2017-08-09 DIAGNOSIS — R601 Generalized edema: Secondary | ICD-10-CM | POA: Diagnosis not present

## 2017-08-09 DIAGNOSIS — K7290 Hepatic failure, unspecified without coma: Secondary | ICD-10-CM | POA: Diagnosis not present

## 2017-08-09 DIAGNOSIS — N19 Unspecified kidney failure: Secondary | ICD-10-CM | POA: Diagnosis not present

## 2017-08-09 DIAGNOSIS — K219 Gastro-esophageal reflux disease without esophagitis: Secondary | ICD-10-CM | POA: Diagnosis not present

## 2017-08-09 DIAGNOSIS — J9620 Acute and chronic respiratory failure, unspecified whether with hypoxia or hypercapnia: Secondary | ICD-10-CM | POA: Diagnosis not present

## 2017-08-09 DIAGNOSIS — R402441 Other coma, without documented Glasgow coma scale score, or with partial score reported, in the field [EMT or ambulance]: Secondary | ICD-10-CM | POA: Diagnosis not present

## 2017-08-09 DIAGNOSIS — R1312 Dysphagia, oropharyngeal phase: Secondary | ICD-10-CM | POA: Diagnosis not present

## 2017-08-09 DIAGNOSIS — I8500 Esophageal varices without bleeding: Secondary | ICD-10-CM | POA: Diagnosis not present

## 2017-08-09 DIAGNOSIS — A419 Sepsis, unspecified organism: Secondary | ICD-10-CM | POA: Diagnosis not present

## 2017-08-09 DIAGNOSIS — R4182 Altered mental status, unspecified: Secondary | ICD-10-CM | POA: Diagnosis not present

## 2017-08-09 DIAGNOSIS — I5032 Chronic diastolic (congestive) heart failure: Secondary | ICD-10-CM | POA: Diagnosis not present

## 2017-08-09 DIAGNOSIS — E870 Hyperosmolality and hypernatremia: Secondary | ICD-10-CM | POA: Diagnosis not present

## 2017-08-09 DIAGNOSIS — I472 Ventricular tachycardia: Secondary | ICD-10-CM | POA: Diagnosis not present

## 2017-08-09 DIAGNOSIS — J441 Chronic obstructive pulmonary disease with (acute) exacerbation: Secondary | ICD-10-CM | POA: Diagnosis not present

## 2017-08-09 DIAGNOSIS — M6281 Muscle weakness (generalized): Secondary | ICD-10-CM | POA: Diagnosis not present

## 2017-08-09 DIAGNOSIS — Z4901 Encounter for fitting and adjustment of extracorporeal dialysis catheter: Secondary | ICD-10-CM | POA: Diagnosis not present

## 2017-08-09 DIAGNOSIS — J9600 Acute respiratory failure, unspecified whether with hypoxia or hypercapnia: Secondary | ICD-10-CM | POA: Diagnosis not present

## 2017-08-09 DIAGNOSIS — Z794 Long term (current) use of insulin: Secondary | ICD-10-CM | POA: Diagnosis not present

## 2017-08-09 DIAGNOSIS — Z825 Family history of asthma and other chronic lower respiratory diseases: Secondary | ICD-10-CM | POA: Diagnosis not present

## 2017-08-09 DIAGNOSIS — J449 Chronic obstructive pulmonary disease, unspecified: Secondary | ICD-10-CM | POA: Diagnosis not present

## 2017-08-09 DIAGNOSIS — Z79899 Other long term (current) drug therapy: Secondary | ICD-10-CM | POA: Diagnosis not present

## 2017-08-09 DIAGNOSIS — E118 Type 2 diabetes mellitus with unspecified complications: Secondary | ICD-10-CM | POA: Diagnosis not present

## 2017-08-09 DIAGNOSIS — R2681 Unsteadiness on feet: Secondary | ICD-10-CM | POA: Diagnosis not present

## 2017-08-09 DIAGNOSIS — J9622 Acute and chronic respiratory failure with hypercapnia: Secondary | ICD-10-CM | POA: Diagnosis not present

## 2017-08-09 DIAGNOSIS — R0602 Shortness of breath: Secondary | ICD-10-CM | POA: Diagnosis not present

## 2017-08-09 DIAGNOSIS — K766 Portal hypertension: Secondary | ICD-10-CM | POA: Diagnosis not present

## 2017-08-09 DIAGNOSIS — Z87891 Personal history of nicotine dependence: Secondary | ICD-10-CM | POA: Diagnosis not present

## 2017-08-09 DIAGNOSIS — R748 Abnormal levels of other serum enzymes: Secondary | ICD-10-CM | POA: Diagnosis not present

## 2017-08-09 DIAGNOSIS — N179 Acute kidney failure, unspecified: Secondary | ICD-10-CM | POA: Diagnosis not present

## 2017-08-09 DIAGNOSIS — K7460 Unspecified cirrhosis of liver: Secondary | ICD-10-CM | POA: Diagnosis not present

## 2017-08-09 DIAGNOSIS — Z833 Family history of diabetes mellitus: Secondary | ICD-10-CM | POA: Diagnosis not present

## 2017-08-09 DIAGNOSIS — K7581 Nonalcoholic steatohepatitis (NASH): Secondary | ICD-10-CM | POA: Diagnosis not present

## 2017-08-09 DIAGNOSIS — I10 Essential (primary) hypertension: Secondary | ICD-10-CM | POA: Diagnosis not present

## 2017-08-09 DIAGNOSIS — G9340 Encephalopathy, unspecified: Secondary | ICD-10-CM | POA: Diagnosis not present

## 2017-08-09 DIAGNOSIS — K7200 Acute and subacute hepatic failure without coma: Secondary | ICD-10-CM | POA: Diagnosis not present

## 2017-08-09 DIAGNOSIS — I491 Atrial premature depolarization: Secondary | ICD-10-CM | POA: Diagnosis not present

## 2017-08-09 DIAGNOSIS — I5033 Acute on chronic diastolic (congestive) heart failure: Secondary | ICD-10-CM | POA: Diagnosis not present

## 2017-08-09 DIAGNOSIS — J9621 Acute and chronic respiratory failure with hypoxia: Secondary | ICD-10-CM | POA: Diagnosis not present

## 2017-08-09 DIAGNOSIS — E8770 Fluid overload, unspecified: Secondary | ICD-10-CM | POA: Diagnosis not present

## 2017-08-09 DIAGNOSIS — R6520 Severe sepsis without septic shock: Secondary | ICD-10-CM | POA: Diagnosis not present

## 2017-08-09 DIAGNOSIS — J80 Acute respiratory distress syndrome: Secondary | ICD-10-CM | POA: Diagnosis not present

## 2017-08-09 DIAGNOSIS — E119 Type 2 diabetes mellitus without complications: Secondary | ICD-10-CM | POA: Diagnosis not present

## 2017-08-09 NOTE — Telephone Encounter (Signed)
Received Home Health Certification and Plan of Care; forwarded to provider/SLS 01/14  

## 2017-08-11 MED ORDER — GENERIC EXTERNAL MEDICATION
15.00 | Status: DC
Start: 2017-08-10 — End: 2017-08-11

## 2017-08-11 MED ORDER — TORSEMIDE 10 MG PO TABS
20.00 | ORAL_TABLET | ORAL | Status: DC
Start: 2017-08-10 — End: 2017-08-11

## 2017-08-11 MED ORDER — GENERIC EXTERNAL MEDICATION
5.00 | Status: DC
Start: ? — End: 2017-08-11

## 2017-08-11 MED ORDER — PREDNISONE 10 MG PO TABS
10.00 | ORAL_TABLET | ORAL | Status: DC
Start: 2017-08-10 — End: 2017-08-11

## 2017-08-11 MED ORDER — ALBUTEROL SULFATE (2.5 MG/3ML) 0.083% IN NEBU
2.50 | INHALATION_SOLUTION | RESPIRATORY_TRACT | Status: DC
Start: 2017-08-09 — End: 2017-08-11

## 2017-08-11 MED ORDER — GENERIC EXTERNAL MEDICATION
10.00 | Status: DC
Start: 2017-08-09 — End: 2017-08-11

## 2017-08-11 MED ORDER — GENERIC EXTERNAL MEDICATION
2.25 g | Status: DC
Start: 2017-08-09 — End: 2017-08-11

## 2017-08-11 MED ORDER — ALPRAZOLAM 0.25 MG PO TABS
.25 | ORAL_TABLET | ORAL | Status: DC
Start: ? — End: 2017-08-11

## 2017-08-11 MED ORDER — GENERIC EXTERNAL MEDICATION
500.00 | Status: DC
Start: ? — End: 2017-08-11

## 2017-08-12 DIAGNOSIS — J449 Chronic obstructive pulmonary disease, unspecified: Secondary | ICD-10-CM | POA: Diagnosis not present

## 2017-08-12 DIAGNOSIS — J810 Acute pulmonary edema: Secondary | ICD-10-CM | POA: Diagnosis not present

## 2017-08-12 DIAGNOSIS — J9600 Acute respiratory failure, unspecified whether with hypoxia or hypercapnia: Secondary | ICD-10-CM | POA: Diagnosis not present

## 2017-08-12 DIAGNOSIS — J9612 Chronic respiratory failure with hypercapnia: Secondary | ICD-10-CM | POA: Diagnosis not present

## 2017-08-12 DIAGNOSIS — Z794 Long term (current) use of insulin: Secondary | ICD-10-CM | POA: Diagnosis not present

## 2017-08-12 DIAGNOSIS — R4020 Unspecified coma: Secondary | ICD-10-CM | POA: Diagnosis not present

## 2017-08-12 DIAGNOSIS — K7200 Acute and subacute hepatic failure without coma: Secondary | ICD-10-CM | POA: Diagnosis not present

## 2017-08-12 DIAGNOSIS — N189 Chronic kidney disease, unspecified: Secondary | ICD-10-CM | POA: Diagnosis not present

## 2017-08-12 DIAGNOSIS — E870 Hyperosmolality and hypernatremia: Secondary | ICD-10-CM | POA: Diagnosis not present

## 2017-08-12 DIAGNOSIS — R402441 Other coma, without documented Glasgow coma scale score, or with partial score reported, in the field [EMT or ambulance]: Secondary | ICD-10-CM | POA: Diagnosis not present

## 2017-08-12 DIAGNOSIS — I10 Essential (primary) hypertension: Secondary | ICD-10-CM | POA: Diagnosis not present

## 2017-08-12 DIAGNOSIS — I8510 Secondary esophageal varices without bleeding: Secondary | ICD-10-CM | POA: Diagnosis not present

## 2017-08-12 DIAGNOSIS — A419 Sepsis, unspecified organism: Secondary | ICD-10-CM | POA: Diagnosis not present

## 2017-08-12 DIAGNOSIS — K740 Hepatic fibrosis: Secondary | ICD-10-CM | POA: Diagnosis not present

## 2017-08-12 DIAGNOSIS — Z87891 Personal history of nicotine dependence: Secondary | ICD-10-CM | POA: Diagnosis not present

## 2017-08-12 DIAGNOSIS — R4182 Altered mental status, unspecified: Secondary | ICD-10-CM | POA: Diagnosis not present

## 2017-08-12 DIAGNOSIS — J9621 Acute and chronic respiratory failure with hypoxia: Secondary | ICD-10-CM | POA: Diagnosis not present

## 2017-08-12 DIAGNOSIS — E119 Type 2 diabetes mellitus without complications: Secondary | ICD-10-CM | POA: Diagnosis not present

## 2017-08-12 DIAGNOSIS — R0602 Shortness of breath: Secondary | ICD-10-CM | POA: Diagnosis not present

## 2017-08-12 DIAGNOSIS — K7460 Unspecified cirrhosis of liver: Secondary | ICD-10-CM | POA: Diagnosis not present

## 2017-08-12 DIAGNOSIS — R488 Other symbolic dysfunctions: Secondary | ICD-10-CM | POA: Diagnosis not present

## 2017-08-12 DIAGNOSIS — Z931 Gastrostomy status: Secondary | ICD-10-CM | POA: Diagnosis not present

## 2017-08-12 DIAGNOSIS — K3189 Other diseases of stomach and duodenum: Secondary | ICD-10-CM | POA: Diagnosis not present

## 2017-08-12 DIAGNOSIS — R6520 Severe sepsis without septic shock: Secondary | ICD-10-CM | POA: Diagnosis not present

## 2017-08-12 DIAGNOSIS — Z9981 Dependence on supplemental oxygen: Secondary | ICD-10-CM | POA: Diagnosis not present

## 2017-08-12 DIAGNOSIS — R601 Generalized edema: Secondary | ICD-10-CM | POA: Diagnosis not present

## 2017-08-12 DIAGNOSIS — N179 Acute kidney failure, unspecified: Secondary | ICD-10-CM | POA: Diagnosis not present

## 2017-08-12 DIAGNOSIS — G9340 Encephalopathy, unspecified: Secondary | ICD-10-CM | POA: Diagnosis not present

## 2017-08-12 DIAGNOSIS — I491 Atrial premature depolarization: Secondary | ICD-10-CM | POA: Diagnosis not present

## 2017-08-12 DIAGNOSIS — R2240 Localized swelling, mass and lump, unspecified lower limb: Secondary | ICD-10-CM | POA: Diagnosis not present

## 2017-08-12 DIAGNOSIS — Z825 Family history of asthma and other chronic lower respiratory diseases: Secondary | ICD-10-CM | POA: Diagnosis not present

## 2017-08-12 DIAGNOSIS — M6281 Muscle weakness (generalized): Secondary | ICD-10-CM | POA: Diagnosis not present

## 2017-08-12 DIAGNOSIS — R1312 Dysphagia, oropharyngeal phase: Secondary | ICD-10-CM | POA: Diagnosis not present

## 2017-08-12 DIAGNOSIS — R748 Abnormal levels of other serum enzymes: Secondary | ICD-10-CM | POA: Diagnosis not present

## 2017-08-12 DIAGNOSIS — I8500 Esophageal varices without bleeding: Secondary | ICD-10-CM | POA: Diagnosis not present

## 2017-08-12 DIAGNOSIS — I5032 Chronic diastolic (congestive) heart failure: Secondary | ICD-10-CM | POA: Diagnosis not present

## 2017-08-12 DIAGNOSIS — I472 Ventricular tachycardia: Secondary | ICD-10-CM | POA: Diagnosis not present

## 2017-08-12 DIAGNOSIS — J9622 Acute and chronic respiratory failure with hypercapnia: Secondary | ICD-10-CM | POA: Diagnosis not present

## 2017-08-12 DIAGNOSIS — E8770 Fluid overload, unspecified: Secondary | ICD-10-CM | POA: Diagnosis not present

## 2017-08-12 DIAGNOSIS — K7030 Alcoholic cirrhosis of liver without ascites: Secondary | ICD-10-CM | POA: Diagnosis not present

## 2017-08-12 DIAGNOSIS — K7581 Nonalcoholic steatohepatitis (NASH): Secondary | ICD-10-CM | POA: Diagnosis not present

## 2017-08-12 DIAGNOSIS — Z833 Family history of diabetes mellitus: Secondary | ICD-10-CM | POA: Diagnosis not present

## 2017-08-12 DIAGNOSIS — R2681 Unsteadiness on feet: Secondary | ICD-10-CM | POA: Diagnosis not present

## 2017-08-12 DIAGNOSIS — K766 Portal hypertension: Secondary | ICD-10-CM | POA: Diagnosis not present

## 2017-08-12 DIAGNOSIS — J441 Chronic obstructive pulmonary disease with (acute) exacerbation: Secondary | ICD-10-CM | POA: Diagnosis not present

## 2017-08-12 DIAGNOSIS — K219 Gastro-esophageal reflux disease without esophagitis: Secondary | ICD-10-CM | POA: Diagnosis not present

## 2017-08-12 DIAGNOSIS — J80 Acute respiratory distress syndrome: Secondary | ICD-10-CM | POA: Diagnosis not present

## 2017-08-12 DIAGNOSIS — J9620 Acute and chronic respiratory failure, unspecified whether with hypoxia or hypercapnia: Secondary | ICD-10-CM | POA: Diagnosis not present

## 2017-08-12 DIAGNOSIS — E118 Type 2 diabetes mellitus with unspecified complications: Secondary | ICD-10-CM | POA: Diagnosis not present

## 2017-08-12 DIAGNOSIS — Z79899 Other long term (current) drug therapy: Secondary | ICD-10-CM | POA: Diagnosis not present

## 2017-08-12 DIAGNOSIS — Z515 Encounter for palliative care: Secondary | ICD-10-CM | POA: Diagnosis not present

## 2017-08-12 DIAGNOSIS — E860 Dehydration: Secondary | ICD-10-CM | POA: Diagnosis not present

## 2017-08-12 DIAGNOSIS — I5033 Acute on chronic diastolic (congestive) heart failure: Secondary | ICD-10-CM | POA: Diagnosis not present

## 2017-08-12 DIAGNOSIS — K7290 Hepatic failure, unspecified without coma: Secondary | ICD-10-CM | POA: Diagnosis not present

## 2017-08-23 DIAGNOSIS — E7220 Disorder of urea cycle metabolism, unspecified: Secondary | ICD-10-CM | POA: Diagnosis not present

## 2017-08-23 DIAGNOSIS — R2681 Unsteadiness on feet: Secondary | ICD-10-CM | POA: Diagnosis not present

## 2017-08-23 DIAGNOSIS — D696 Thrombocytopenia, unspecified: Secondary | ICD-10-CM | POA: Diagnosis not present

## 2017-08-23 DIAGNOSIS — E8770 Fluid overload, unspecified: Secondary | ICD-10-CM | POA: Diagnosis not present

## 2017-08-23 DIAGNOSIS — Z79899 Other long term (current) drug therapy: Secondary | ICD-10-CM | POA: Diagnosis not present

## 2017-08-23 DIAGNOSIS — J90 Pleural effusion, not elsewhere classified: Secondary | ICD-10-CM | POA: Diagnosis not present

## 2017-08-23 DIAGNOSIS — E870 Hyperosmolality and hypernatremia: Secondary | ICD-10-CM | POA: Diagnosis not present

## 2017-08-23 DIAGNOSIS — R488 Other symbolic dysfunctions: Secondary | ICD-10-CM | POA: Diagnosis not present

## 2017-08-23 DIAGNOSIS — K7030 Alcoholic cirrhosis of liver without ascites: Secondary | ICD-10-CM | POA: Diagnosis not present

## 2017-08-23 DIAGNOSIS — Z66 Do not resuscitate: Secondary | ICD-10-CM | POA: Diagnosis not present

## 2017-08-23 DIAGNOSIS — Z794 Long term (current) use of insulin: Secondary | ICD-10-CM | POA: Diagnosis not present

## 2017-08-23 DIAGNOSIS — E878 Other disorders of electrolyte and fluid balance, not elsewhere classified: Secondary | ICD-10-CM | POA: Diagnosis not present

## 2017-08-23 DIAGNOSIS — J9612 Chronic respiratory failure with hypercapnia: Secondary | ICD-10-CM | POA: Diagnosis not present

## 2017-08-23 DIAGNOSIS — R1312 Dysphagia, oropharyngeal phase: Secondary | ICD-10-CM | POA: Diagnosis not present

## 2017-08-23 DIAGNOSIS — E8809 Other disorders of plasma-protein metabolism, not elsewhere classified: Secondary | ICD-10-CM | POA: Diagnosis not present

## 2017-08-23 DIAGNOSIS — G9341 Metabolic encephalopathy: Secondary | ICD-10-CM | POA: Diagnosis not present

## 2017-08-23 DIAGNOSIS — R748 Abnormal levels of other serum enzymes: Secondary | ICD-10-CM | POA: Diagnosis not present

## 2017-08-23 DIAGNOSIS — J9811 Atelectasis: Secondary | ICD-10-CM | POA: Diagnosis not present

## 2017-08-23 DIAGNOSIS — J9621 Acute and chronic respiratory failure with hypoxia: Secondary | ICD-10-CM | POA: Diagnosis not present

## 2017-08-23 DIAGNOSIS — K7290 Hepatic failure, unspecified without coma: Secondary | ICD-10-CM | POA: Diagnosis not present

## 2017-08-23 DIAGNOSIS — J9622 Acute and chronic respiratory failure with hypercapnia: Secondary | ICD-10-CM | POA: Diagnosis not present

## 2017-08-23 DIAGNOSIS — I5033 Acute on chronic diastolic (congestive) heart failure: Secondary | ICD-10-CM | POA: Diagnosis not present

## 2017-08-23 DIAGNOSIS — K3189 Other diseases of stomach and duodenum: Secondary | ICD-10-CM | POA: Diagnosis not present

## 2017-08-23 DIAGNOSIS — K7581 Nonalcoholic steatohepatitis (NASH): Secondary | ICD-10-CM | POA: Diagnosis not present

## 2017-08-23 DIAGNOSIS — R6889 Other general symptoms and signs: Secondary | ICD-10-CM | POA: Diagnosis not present

## 2017-08-23 DIAGNOSIS — K219 Gastro-esophageal reflux disease without esophagitis: Secondary | ICD-10-CM | POA: Diagnosis not present

## 2017-08-23 DIAGNOSIS — Z87891 Personal history of nicotine dependence: Secondary | ICD-10-CM | POA: Diagnosis not present

## 2017-08-23 DIAGNOSIS — Z833 Family history of diabetes mellitus: Secondary | ICD-10-CM | POA: Diagnosis not present

## 2017-08-23 DIAGNOSIS — M6281 Muscle weakness (generalized): Secondary | ICD-10-CM | POA: Diagnosis not present

## 2017-08-23 DIAGNOSIS — J449 Chronic obstructive pulmonary disease, unspecified: Secondary | ICD-10-CM | POA: Diagnosis not present

## 2017-08-23 DIAGNOSIS — E860 Dehydration: Secondary | ICD-10-CM | POA: Diagnosis not present

## 2017-08-23 DIAGNOSIS — E118 Type 2 diabetes mellitus with unspecified complications: Secondary | ICD-10-CM | POA: Diagnosis not present

## 2017-08-23 DIAGNOSIS — Z825 Family history of asthma and other chronic lower respiratory diseases: Secondary | ICD-10-CM | POA: Diagnosis not present

## 2017-08-23 DIAGNOSIS — I5030 Unspecified diastolic (congestive) heart failure: Secondary | ICD-10-CM | POA: Diagnosis not present

## 2017-08-23 DIAGNOSIS — I472 Ventricular tachycardia: Secondary | ICD-10-CM | POA: Diagnosis not present

## 2017-08-23 DIAGNOSIS — J80 Acute respiratory distress syndrome: Secondary | ICD-10-CM | POA: Diagnosis not present

## 2017-08-23 DIAGNOSIS — R4182 Altered mental status, unspecified: Secondary | ICD-10-CM | POA: Diagnosis not present

## 2017-08-23 DIAGNOSIS — J9620 Acute and chronic respiratory failure, unspecified whether with hypoxia or hypercapnia: Secondary | ICD-10-CM | POA: Diagnosis not present

## 2017-08-23 DIAGNOSIS — J441 Chronic obstructive pulmonary disease with (acute) exacerbation: Secondary | ICD-10-CM | POA: Diagnosis not present

## 2017-08-23 DIAGNOSIS — N390 Urinary tract infection, site not specified: Secondary | ICD-10-CM | POA: Diagnosis not present

## 2017-08-24 DIAGNOSIS — J441 Chronic obstructive pulmonary disease with (acute) exacerbation: Secondary | ICD-10-CM | POA: Diagnosis not present

## 2017-08-24 DIAGNOSIS — E118 Type 2 diabetes mellitus with unspecified complications: Secondary | ICD-10-CM | POA: Diagnosis not present

## 2017-08-24 DIAGNOSIS — I5030 Unspecified diastolic (congestive) heart failure: Secondary | ICD-10-CM | POA: Diagnosis not present

## 2017-08-24 DIAGNOSIS — K219 Gastro-esophageal reflux disease without esophagitis: Secondary | ICD-10-CM | POA: Diagnosis not present

## 2017-08-30 DIAGNOSIS — D696 Thrombocytopenia, unspecified: Secondary | ICD-10-CM | POA: Diagnosis not present

## 2017-08-30 DIAGNOSIS — E860 Dehydration: Secondary | ICD-10-CM | POA: Diagnosis not present

## 2017-08-30 DIAGNOSIS — Z66 Do not resuscitate: Secondary | ICD-10-CM | POA: Diagnosis not present

## 2017-08-30 DIAGNOSIS — K7581 Nonalcoholic steatohepatitis (NASH): Secondary | ICD-10-CM | POA: Diagnosis not present

## 2017-08-30 DIAGNOSIS — R17 Unspecified jaundice: Secondary | ICD-10-CM | POA: Diagnosis not present

## 2017-08-30 DIAGNOSIS — B961 Klebsiella pneumoniae [K. pneumoniae] as the cause of diseases classified elsewhere: Secondary | ICD-10-CM | POA: Diagnosis not present

## 2017-08-30 DIAGNOSIS — Z79899 Other long term (current) drug therapy: Secondary | ICD-10-CM | POA: Diagnosis not present

## 2017-08-30 DIAGNOSIS — E7220 Disorder of urea cycle metabolism, unspecified: Secondary | ICD-10-CM | POA: Diagnosis not present

## 2017-08-30 DIAGNOSIS — Z794 Long term (current) use of insulin: Secondary | ICD-10-CM | POA: Diagnosis not present

## 2017-08-30 DIAGNOSIS — Z87891 Personal history of nicotine dependence: Secondary | ICD-10-CM | POA: Diagnosis not present

## 2017-08-30 DIAGNOSIS — J449 Chronic obstructive pulmonary disease, unspecified: Secondary | ICD-10-CM | POA: Diagnosis not present

## 2017-08-30 DIAGNOSIS — Z833 Family history of diabetes mellitus: Secondary | ICD-10-CM | POA: Diagnosis not present

## 2017-08-30 DIAGNOSIS — J9811 Atelectasis: Secondary | ICD-10-CM | POA: Diagnosis not present

## 2017-08-30 DIAGNOSIS — J90 Pleural effusion, not elsewhere classified: Secondary | ICD-10-CM | POA: Diagnosis not present

## 2017-08-30 DIAGNOSIS — I517 Cardiomegaly: Secondary | ICD-10-CM | POA: Diagnosis not present

## 2017-08-30 DIAGNOSIS — E8809 Other disorders of plasma-protein metabolism, not elsewhere classified: Secondary | ICD-10-CM | POA: Diagnosis not present

## 2017-08-30 DIAGNOSIS — Z825 Family history of asthma and other chronic lower respiratory diseases: Secondary | ICD-10-CM | POA: Diagnosis not present

## 2017-08-30 DIAGNOSIS — I472 Ventricular tachycardia: Secondary | ICD-10-CM | POA: Diagnosis not present

## 2017-08-30 DIAGNOSIS — E870 Hyperosmolality and hypernatremia: Secondary | ICD-10-CM | POA: Diagnosis not present

## 2017-08-30 DIAGNOSIS — I493 Ventricular premature depolarization: Secondary | ICD-10-CM | POA: Diagnosis not present

## 2017-08-30 DIAGNOSIS — I491 Atrial premature depolarization: Secondary | ICD-10-CM | POA: Diagnosis not present

## 2017-08-30 DIAGNOSIS — K7290 Hepatic failure, unspecified without coma: Secondary | ICD-10-CM | POA: Diagnosis not present

## 2017-08-30 DIAGNOSIS — I4589 Other specified conduction disorders: Secondary | ICD-10-CM | POA: Diagnosis not present

## 2017-08-30 DIAGNOSIS — R000 Tachycardia, unspecified: Secondary | ICD-10-CM | POA: Diagnosis not present

## 2017-08-30 DIAGNOSIS — E878 Other disorders of electrolyte and fluid balance, not elsewhere classified: Secondary | ICD-10-CM | POA: Diagnosis not present

## 2017-08-30 DIAGNOSIS — R6889 Other general symptoms and signs: Secondary | ICD-10-CM | POA: Diagnosis not present

## 2017-08-30 DIAGNOSIS — K7460 Unspecified cirrhosis of liver: Secondary | ICD-10-CM | POA: Diagnosis not present

## 2017-08-30 DIAGNOSIS — N390 Urinary tract infection, site not specified: Secondary | ICD-10-CM | POA: Diagnosis not present

## 2017-08-30 DIAGNOSIS — E119 Type 2 diabetes mellitus without complications: Secondary | ICD-10-CM | POA: Diagnosis not present

## 2017-08-30 DIAGNOSIS — R4182 Altered mental status, unspecified: Secondary | ICD-10-CM | POA: Diagnosis not present

## 2017-08-30 DIAGNOSIS — G9341 Metabolic encephalopathy: Secondary | ICD-10-CM | POA: Diagnosis not present

## 2017-09-09 DIAGNOSIS — E113599 Type 2 diabetes mellitus with proliferative diabetic retinopathy without macular edema, unspecified eye: Secondary | ICD-10-CM | POA: Diagnosis not present

## 2017-09-09 DIAGNOSIS — M1710 Unilateral primary osteoarthritis, unspecified knee: Secondary | ICD-10-CM | POA: Diagnosis not present

## 2017-09-09 DIAGNOSIS — J189 Pneumonia, unspecified organism: Secondary | ICD-10-CM | POA: Diagnosis not present

## 2017-09-09 DIAGNOSIS — R4182 Altered mental status, unspecified: Secondary | ICD-10-CM | POA: Diagnosis not present

## 2017-09-09 DIAGNOSIS — E119 Type 2 diabetes mellitus without complications: Secondary | ICD-10-CM | POA: Diagnosis not present

## 2017-09-09 DIAGNOSIS — J449 Chronic obstructive pulmonary disease, unspecified: Secondary | ICD-10-CM | POA: Diagnosis not present

## 2017-09-09 DIAGNOSIS — K7291 Hepatic failure, unspecified with coma: Secondary | ICD-10-CM | POA: Diagnosis not present

## 2017-09-09 DIAGNOSIS — G4700 Insomnia, unspecified: Secondary | ICD-10-CM | POA: Diagnosis not present

## 2017-09-09 DIAGNOSIS — R5381 Other malaise: Secondary | ICD-10-CM | POA: Diagnosis not present

## 2017-09-09 DIAGNOSIS — Z87891 Personal history of nicotine dependence: Secondary | ICD-10-CM | POA: Diagnosis not present

## 2017-09-09 DIAGNOSIS — R05 Cough: Secondary | ICD-10-CM | POA: Diagnosis not present

## 2017-09-09 DIAGNOSIS — R54 Age-related physical debility: Secondary | ICD-10-CM | POA: Diagnosis not present

## 2017-09-09 DIAGNOSIS — R7889 Finding of other specified substances, not normally found in blood: Secondary | ICD-10-CM | POA: Diagnosis not present

## 2017-09-09 DIAGNOSIS — E1165 Type 2 diabetes mellitus with hyperglycemia: Secondary | ICD-10-CM | POA: Diagnosis not present

## 2017-09-09 DIAGNOSIS — R488 Other symbolic dysfunctions: Secondary | ICD-10-CM | POA: Diagnosis not present

## 2017-09-09 DIAGNOSIS — R0603 Acute respiratory distress: Secondary | ICD-10-CM | POA: Diagnosis not present

## 2017-09-09 DIAGNOSIS — M6281 Muscle weakness (generalized): Secondary | ICD-10-CM | POA: Diagnosis not present

## 2017-09-09 DIAGNOSIS — Z7409 Other reduced mobility: Secondary | ICD-10-CM | POA: Diagnosis not present

## 2017-09-09 DIAGNOSIS — K7290 Hepatic failure, unspecified without coma: Secondary | ICD-10-CM | POA: Diagnosis not present

## 2017-09-09 DIAGNOSIS — A419 Sepsis, unspecified organism: Secondary | ICD-10-CM | POA: Diagnosis not present

## 2017-09-09 DIAGNOSIS — K7581 Nonalcoholic steatohepatitis (NASH): Secondary | ICD-10-CM | POA: Diagnosis not present

## 2017-09-09 DIAGNOSIS — R269 Unspecified abnormalities of gait and mobility: Secondary | ICD-10-CM | POA: Diagnosis not present

## 2017-09-09 DIAGNOSIS — Z9181 History of falling: Secondary | ICD-10-CM | POA: Diagnosis not present

## 2017-09-09 DIAGNOSIS — R2681 Unsteadiness on feet: Secondary | ICD-10-CM | POA: Diagnosis not present

## 2017-09-09 DIAGNOSIS — R1312 Dysphagia, oropharyngeal phase: Secondary | ICD-10-CM | POA: Diagnosis not present

## 2017-09-09 DIAGNOSIS — K7460 Unspecified cirrhosis of liver: Secondary | ICD-10-CM | POA: Diagnosis not present

## 2017-09-09 DIAGNOSIS — J441 Chronic obstructive pulmonary disease with (acute) exacerbation: Secondary | ICD-10-CM | POA: Diagnosis not present

## 2017-09-09 DIAGNOSIS — R262 Difficulty in walking, not elsewhere classified: Secondary | ICD-10-CM | POA: Diagnosis not present

## 2017-09-09 DIAGNOSIS — N390 Urinary tract infection, site not specified: Secondary | ICD-10-CM | POA: Diagnosis not present

## 2017-09-09 DIAGNOSIS — R079 Chest pain, unspecified: Secondary | ICD-10-CM | POA: Diagnosis not present

## 2017-09-09 DIAGNOSIS — R600 Localized edema: Secondary | ICD-10-CM | POA: Diagnosis not present

## 2017-09-20 ENCOUNTER — Other Ambulatory Visit: Payer: Self-pay | Admitting: *Deleted

## 2017-09-20 DIAGNOSIS — J189 Pneumonia, unspecified organism: Secondary | ICD-10-CM | POA: Diagnosis not present

## 2017-09-20 DIAGNOSIS — A419 Sepsis, unspecified organism: Secondary | ICD-10-CM | POA: Diagnosis not present

## 2017-09-20 DIAGNOSIS — J441 Chronic obstructive pulmonary disease with (acute) exacerbation: Secondary | ICD-10-CM | POA: Diagnosis not present

## 2017-09-20 DIAGNOSIS — R0603 Acute respiratory distress: Secondary | ICD-10-CM | POA: Diagnosis not present

## 2017-09-20 NOTE — Patient Outreach (Signed)
[facility]) Care Management  09/20/2017  Ashley Savage August 19, 1940 [id number]   CSW made an initial attempt to try and contact patient today to perform phone assessment, as well as assess and assist with social needs and services, without success.  A HIPAA compliant message was left for patient on voicemail.  CSW is currently awaiting a return call. CSW will make a second outreach attempt on Tuesday, September 21, 2017, if CSW does not receive a return call from patient in the meantime. Nat Christen, BSW, MSW, LCSW  Licensed Education officer, [facility]  Mailing [place] [address] [place], [place] [address] Physical Address-[address], [place],  [address] Toll Free Main # [phone] Fax # [phone] Cell # [phone]  Office # [phone] [email or web address]

## 2017-09-21 ENCOUNTER — Encounter: Payer: Self-pay | Admitting: *Deleted

## 2017-09-21 ENCOUNTER — Other Ambulatory Visit: Payer: Self-pay | Admitting: *Deleted

## 2017-09-21 NOTE — Patient Outreach (Signed)
[facility]) Care Management  09/21/2017  Ashley Savage Nov 17, 1940 [id number]   CSW made a second attempt to try and contact patient today to perform phone assessment, as well as assess and assist with social work needs and services, without success.  A HIPAA compliant message was left for patient on voicemail.  CSW continues to await a return call.  CSW will mail an outreach letter to patient's home, encouraging patient to contact CSW at their earliest convenience, if patient is interested in receiving social work services through [place] with [facility].  If CSW does not receive a return call from patient within the next 10 business days, CSW will make one last call attempt before proceeding with case closure.  Required number of phone attempts will have been made and outreach letter mailed.  Nat Christen, BSW, MSW, LCSW  Licensed Education officer, environmental [facility]  Mailing [place] [address], [place], [place] [address] Physical Address-[address], [place], [place] [address] Toll Free Main # [phone] Fax # [phone] Cell # [phone]  Office # [phone] [name]

## 2017-09-23 LAB — CBC AND DIFFERENTIAL
HCT: 36 (ref 36–46)
Hemoglobin: 11.8 — AB (ref 12.0–16.0)
Platelets: 146 — AB (ref 150–399)
WBC: 7.3

## 2017-09-29 DIAGNOSIS — R4182 Altered mental status, unspecified: Secondary | ICD-10-CM | POA: Diagnosis not present

## 2017-09-29 DIAGNOSIS — J441 Chronic obstructive pulmonary disease with (acute) exacerbation: Secondary | ICD-10-CM | POA: Diagnosis not present

## 2017-09-29 DIAGNOSIS — E1165 Type 2 diabetes mellitus with hyperglycemia: Secondary | ICD-10-CM | POA: Diagnosis not present

## 2017-09-29 DIAGNOSIS — E1142 Type 2 diabetes mellitus with diabetic polyneuropathy: Secondary | ICD-10-CM | POA: Diagnosis not present

## 2017-09-29 DIAGNOSIS — J810 Acute pulmonary edema: Secondary | ICD-10-CM | POA: Diagnosis not present

## 2017-09-29 DIAGNOSIS — R262 Difficulty in walking, not elsewhere classified: Secondary | ICD-10-CM | POA: Diagnosis not present

## 2017-09-29 DIAGNOSIS — J9622 Acute and chronic respiratory failure with hypercapnia: Secondary | ICD-10-CM | POA: Diagnosis not present

## 2017-09-29 DIAGNOSIS — J189 Pneumonia, unspecified organism: Secondary | ICD-10-CM | POA: Diagnosis not present

## 2017-09-29 DIAGNOSIS — Z9981 Dependence on supplemental oxygen: Secondary | ICD-10-CM | POA: Diagnosis not present

## 2017-09-29 DIAGNOSIS — F419 Anxiety disorder, unspecified: Secondary | ICD-10-CM | POA: Diagnosis not present

## 2017-09-29 DIAGNOSIS — F329 Major depressive disorder, single episode, unspecified: Secondary | ICD-10-CM | POA: Diagnosis not present

## 2017-09-29 DIAGNOSIS — K3189 Other diseases of stomach and duodenum: Secondary | ICD-10-CM | POA: Diagnosis not present

## 2017-09-29 DIAGNOSIS — D696 Thrombocytopenia, unspecified: Secondary | ICD-10-CM | POA: Diagnosis not present

## 2017-09-29 DIAGNOSIS — Z9181 History of falling: Secondary | ICD-10-CM | POA: Diagnosis not present

## 2017-09-29 DIAGNOSIS — K7460 Unspecified cirrhosis of liver: Secondary | ICD-10-CM | POA: Diagnosis not present

## 2017-09-29 DIAGNOSIS — A419 Sepsis, unspecified organism: Secondary | ICD-10-CM | POA: Diagnosis not present

## 2017-09-29 DIAGNOSIS — R531 Weakness: Secondary | ICD-10-CM | POA: Diagnosis not present

## 2017-09-29 DIAGNOSIS — I10 Essential (primary) hypertension: Secondary | ICD-10-CM | POA: Diagnosis not present

## 2017-09-29 DIAGNOSIS — R5381 Other malaise: Secondary | ICD-10-CM | POA: Diagnosis not present

## 2017-09-29 DIAGNOSIS — R600 Localized edema: Secondary | ICD-10-CM | POA: Diagnosis not present

## 2017-09-29 DIAGNOSIS — G9341 Metabolic encephalopathy: Secondary | ICD-10-CM | POA: Diagnosis not present

## 2017-09-29 DIAGNOSIS — J449 Chronic obstructive pulmonary disease, unspecified: Secondary | ICD-10-CM | POA: Diagnosis not present

## 2017-09-29 DIAGNOSIS — R1312 Dysphagia, oropharyngeal phase: Secondary | ICD-10-CM | POA: Diagnosis not present

## 2017-09-29 DIAGNOSIS — M6281 Muscle weakness (generalized): Secondary | ICD-10-CM | POA: Diagnosis not present

## 2017-09-29 DIAGNOSIS — Y95 Nosocomial condition: Secondary | ICD-10-CM | POA: Diagnosis present

## 2017-09-29 DIAGNOSIS — C50019 Malignant neoplasm of nipple and areola, unspecified female breast: Secondary | ICD-10-CM | POA: Diagnosis not present

## 2017-09-29 DIAGNOSIS — R0603 Acute respiratory distress: Secondary | ICD-10-CM | POA: Diagnosis present

## 2017-09-29 DIAGNOSIS — J440 Chronic obstructive pulmonary disease with acute lower respiratory infection: Secondary | ICD-10-CM | POA: Diagnosis not present

## 2017-09-29 DIAGNOSIS — R7989 Other specified abnormal findings of blood chemistry: Secondary | ICD-10-CM | POA: Diagnosis not present

## 2017-09-29 DIAGNOSIS — K219 Gastro-esophageal reflux disease without esophagitis: Secondary | ICD-10-CM | POA: Diagnosis not present

## 2017-09-29 DIAGNOSIS — M1990 Unspecified osteoarthritis, unspecified site: Secondary | ICD-10-CM | POA: Diagnosis not present

## 2017-09-29 DIAGNOSIS — N390 Urinary tract infection, site not specified: Secondary | ICD-10-CM | POA: Diagnosis not present

## 2017-09-29 DIAGNOSIS — K766 Portal hypertension: Secondary | ICD-10-CM | POA: Diagnosis not present

## 2017-09-29 DIAGNOSIS — F410 Panic disorder [episodic paroxysmal anxiety] without agoraphobia: Secondary | ICD-10-CM | POA: Diagnosis not present

## 2017-09-29 DIAGNOSIS — E782 Mixed hyperlipidemia: Secondary | ICD-10-CM | POA: Diagnosis not present

## 2017-09-29 DIAGNOSIS — M1710 Unilateral primary osteoarthritis, unspecified knee: Secondary | ICD-10-CM | POA: Diagnosis not present

## 2017-09-29 DIAGNOSIS — E113599 Type 2 diabetes mellitus with proliferative diabetic retinopathy without macular edema, unspecified eye: Secondary | ICD-10-CM | POA: Diagnosis not present

## 2017-09-29 DIAGNOSIS — R6521 Severe sepsis with septic shock: Secondary | ICD-10-CM | POA: Diagnosis not present

## 2017-09-29 DIAGNOSIS — R488 Other symbolic dysfunctions: Secondary | ICD-10-CM | POA: Diagnosis not present

## 2017-09-29 DIAGNOSIS — Z794 Long term (current) use of insulin: Secondary | ICD-10-CM | POA: Diagnosis not present

## 2017-09-29 DIAGNOSIS — I509 Heart failure, unspecified: Secondary | ICD-10-CM | POA: Diagnosis not present

## 2017-09-29 DIAGNOSIS — R0602 Shortness of breath: Secondary | ICD-10-CM | POA: Diagnosis not present

## 2017-09-29 DIAGNOSIS — Z66 Do not resuscitate: Secondary | ICD-10-CM | POA: Diagnosis not present

## 2017-09-29 DIAGNOSIS — J9621 Acute and chronic respiratory failure with hypoxia: Secondary | ICD-10-CM | POA: Diagnosis not present

## 2017-09-29 DIAGNOSIS — K7290 Hepatic failure, unspecified without coma: Secondary | ICD-10-CM | POA: Diagnosis not present

## 2017-09-29 DIAGNOSIS — N179 Acute kidney failure, unspecified: Secondary | ICD-10-CM | POA: Diagnosis not present

## 2017-09-29 DIAGNOSIS — R2681 Unsteadiness on feet: Secondary | ICD-10-CM | POA: Diagnosis not present

## 2017-09-29 DIAGNOSIS — K7581 Nonalcoholic steatohepatitis (NASH): Secondary | ICD-10-CM | POA: Diagnosis not present

## 2017-09-29 DIAGNOSIS — E872 Acidosis: Secondary | ICD-10-CM | POA: Diagnosis not present

## 2017-10-01 ENCOUNTER — Non-Acute Institutional Stay (SKILLED_NURSING_FACILITY): Payer: PPO | Admitting: Internal Medicine

## 2017-10-01 ENCOUNTER — Encounter: Payer: Self-pay | Admitting: Internal Medicine

## 2017-10-01 DIAGNOSIS — Z794 Long term (current) use of insulin: Secondary | ICD-10-CM | POA: Diagnosis not present

## 2017-10-01 DIAGNOSIS — R7989 Other specified abnormal findings of blood chemistry: Secondary | ICD-10-CM | POA: Diagnosis not present

## 2017-10-01 DIAGNOSIS — K7581 Nonalcoholic steatohepatitis (NASH): Secondary | ICD-10-CM

## 2017-10-01 DIAGNOSIS — M1990 Unspecified osteoarthritis, unspecified site: Secondary | ICD-10-CM | POA: Diagnosis not present

## 2017-10-01 DIAGNOSIS — E1142 Type 2 diabetes mellitus with diabetic polyneuropathy: Secondary | ICD-10-CM | POA: Diagnosis not present

## 2017-10-01 DIAGNOSIS — R600 Localized edema: Secondary | ICD-10-CM | POA: Diagnosis not present

## 2017-10-01 DIAGNOSIS — K7460 Unspecified cirrhosis of liver: Secondary | ICD-10-CM

## 2017-10-01 DIAGNOSIS — K7682 Hepatic encephalopathy: Secondary | ICD-10-CM

## 2017-10-01 DIAGNOSIS — K7290 Hepatic failure, unspecified without coma: Secondary | ICD-10-CM | POA: Diagnosis not present

## 2017-10-01 DIAGNOSIS — K219 Gastro-esophageal reflux disease without esophagitis: Secondary | ICD-10-CM | POA: Diagnosis not present

## 2017-10-01 NOTE — Progress Notes (Signed)
: Provider:  Hennie Duos., MD Location:  [place] Room Number: 319-P Place of Service:  SNF ([phone])  PCP: Mosie Lukes, MD Patient Care Team: Mosie Lukes, MD as PCP - General (Family Medicine) Dene Gentry, MD as Consulting Physician (Sports Medicine) Mauri Pole, MD as Consulting Physician (Gastroenterology) Rigoberto Noel, MD as Consulting Physician (Pulmonary Disease) Parrett, Fonnie Mu, NP as Nurse Practitioner (Pulmonary Disease) Volanda Napoleon, MD as Consulting Physician (Oncology) Saporito, Maree Erie, LCSW as [employer]  Extended Emergency Contact Information Primary Emergency Contact: [place]ella [place], [place] Phone: [phone] Mobile Phone: [phone] Relation: Friend Secondary Emergency Contact: Kathryne Hitch Address: [address]          [place],  [address] [place] Phone: [phone] Mobile Phone: [phone] Relation: Friend     Allergies: Citalopram; Ciprofloxacin; Erythromycin; Glimepiride; and Prednisone  Chief Complaint  Patient presents with  . New Admit To SNF    HPI: Patient is 77 y.o. female with COPD, type 2 diabetes, and liver cirrhosis secondary to [facility] admitted [facility] from 2/4-14 where she was treated for hepatic encephalopathy, Klebsiella pneumonia UTI, and acute hypernatremia. Patient was then [facility] where she was hospitalized from 2/14-3/6 for acute metabolic encephalopathy and management of other comorbidities. After admission patient became more disoriented and repeat ammonia level had risen from 129-214. Her lactulose was increased with some improvement but was limited by patient's bowel movements. She was also on Xifaxan. The day prior to discharge patient's left leg was noted to be swollen and ultrasound was ordered which was negative for DVT. Given  overall poor prognosis, after discussion with family, best disposition was thought to be hospice care. However patient has been admitted to skilled nursing facility for OT/PT. While at skilled nursing facility patient will be followed for diabetes treated with insulin, GERD treated with Protonix and arthritis treated with Celebrex.         Past Medical History:  Diagnosis Date  . Abdominal aortic aneurysm ([place]) 10/01/2013   Fall of 2014 3.3 per patient, follows with Vascular surgeon.   . Acute respiratory failure with hypoxia ([place]) 07/31/2015  . Allergic state 11/10/2016  . Anxiety   . Anxiety and depression 02/01/2014  . Arthritis of both knees 10/01/2013  . Arthritis of right knee 10/01/2013   Follows with Dr Mayer Camel   . Benign paroxysmal positional vertigo 10/01/2013  . Breast cancer ([place])    No disease activity On Femara Follows with Dr Marin Olp Right mastectomy performed by Dr Autumn Messing   . Cancer [facility]) breast ca  right  . Chronic respiratory failure ([place]) 08/29/2015  . Cirrhosis of liver without ascites ([place]) 07/31/2015  . COPD (chronic obstructive pulmonary disease) ([place]) 10/01/2013  . COPD with acute exacerbation ([place]) 05/05/2017  . Depression   . Dermatitis 03/30/2017  . Diabetes mellitus type 2  . Diabetes mellitus type 2, controlled ([place]) 10/16/2014  . Emphysema   . Encephalopathy, hepatic ([place]) 06/07/2014  . Esophageal reflux 10/01/2013  . Fall 07/30/2016  . Hyperlipidemia   . Hyperlipidemia, mixed   . Increased ammonia level 11/25/2014  . NASH (nonalcoholic steatohepatitis) 08/26/2015  . Neck pain 10/01/2013  . Neuropathy    feet   . Osteopenia 03/30/2017  . Overactive bladder 12/10/2013  . Panic attacks   . Pedal  edema 12/10/2013  . Personal history of radiation therapy   . Preventative health care 03/08/2016  . Thrombocytopenia ([place]) 03/17/2012  . Tobacco abuse disorder 02/01/2014  . Type 2 diabetes mellitus with hyperglycemia, with long-term current use of insulin ([place])   . Urine frequency  04/13/2017    Past Surgical History:  Procedure Laterality Date  . APPENDECTOMY  2007  . BREAST SURGERY  2009 right  . CATARACT EXTRACTION     x 2  . ESOPHAGOGASTRODUODENOSCOPY (EGD) WITH PROPOFOL N/A 08/14/2016   Procedure: ESOPHAGOGASTRODUODENOSCOPY (EGD) WITH PROPOFOL;  Surgeon: Mauri Pole, MD;  Location: [facility];  Service: Endoscopy;  Laterality: N/A;  . [place]  . KNEE SURGERY    . MANDIBLE FRACTURE SURGERY    . MASTECTOMY    . PILONIDAL CYST EXCISION    . TONSILLECTOMY      Allergies as of 10/01/2017      Reactions   Citalopram Palpitations   Irregular heart beat Irregular heart beat   Ciprofloxacin Other (See Comments)   Mental status change Mental status change   Erythromycin Other (See Comments)   Stomach cramps Stomach cramps   Glimepiride Other (See Comments)   Elevated ammonia levels Elevated ammonia levels   Prednisone    Increased blood sugars too high      Medication List        Accurate as of 10/01/17 11:59 PM. Always use your most recent med list.          acetaminophen 325 MG tablet Commonly known as:  TYLENOL Take 650 mg by mouth every 6 (six) hours as needed for mild pain or moderate pain.   albuterol 0.63 MG/3ML nebulizer solution Commonly known as:  ACCUNEB Take 1 ampule by nebulization every 6 (six) hours as needed for shortness of breath.   celecoxib 100 MG capsule Commonly known as:  CELEBREX Take 100 mg by mouth every 8 (eight) hours as needed for mild pain.   FLONASE ALLERGY RELIEF 50 MCG/ACT nasal spray Generic drug:  fluticasone Place 1 spray into both nostrils daily.   furosemide 40 MG tablet Commonly known as:  LASIX Take 40 mg by mouth daily.   furosemide 20 MG tablet Commonly known as:  LASIX Take 20 mg by mouth daily. Start taking on:  10/14/2017   hydrocortisone cream 1 % Apply 1 application topically daily as needed for itching. Apply rectally as needed   insulin aspart 100 UNIT/ML  FlexPen Commonly known as:  NOVOLOG FLEXPEN Sliding scale 121-150 - 2 units; 151-200 - 3 units; 201-250 -5 units; 251-300 - 8 units; 301-350 - 11units; 351-400 - 15 unitsc351-400 -15 units; greater than 400 -15 units and call MD.   lactulose 10 GM/15ML solution Commonly known as:  CHRONULAC Take 40 g by mouth every 6 (six) hours.   LANTUS 100 UNIT/ML injection Generic drug:  insulin glargine Inject 8 Units into the skin See admin instructions. 8 units QAM and 8 units QHS   LORazepam 0.5 MG tablet Commonly known as:  ATIVAN Take 0.5 mg by mouth every 6 (six) hours as needed for anxiety.   magnesium oxide 400 MG tablet Commonly known as:  MAG-OX Take 400 mg by mouth 2 (two) times daily.   mometasone-formoterol 100-5 MCG/ACT Aero Commonly known as:  DULERA Inhale 2 puffs into the lungs 2 (two) times daily.   pantoprazole 20 MG tablet Commonly known as:  PROTONIX Take 20 mg by mouth daily.   polyvinyl alcohol 1.4 %  ophthalmic solution Commonly known as:  LUBRICANT DROPS Place 1 drop into both eyes as needed (dry eyes).   potassium chloride 10 MEQ tablet Commonly known as:  K-DUR Take 10 mEq by mouth daily.   sodium chloride 0.65 % Soln nasal spray Commonly known as:  OCEAN Place 2 sprays into both nostrils 2 (two) times daily as needed for congestion.       No orders of the defined types were placed in this encounter.   Immunization History  Administered Date(s) Administered  . Influenza Split 04/09/2011, 03/27/2012, 03/27/2013  . Influenza, High Dose Seasonal PF 03/30/2017  . Influenza,inj,Quad PF,6+ Mos 03/26/2015  . Influenza-Unspecified 04/24/2014, 04/23/2016, 03/30/2017  . Pneumococcal Conjugate-13 02/01/2014  . Pneumococcal Polysaccharide-23 04/09/2011  . Td 07/27/2010  . Tdap 05/08/2014    Social History   Tobacco Use  . Smoking status: Former Smoker    Packs/day: 0.25    Years: 58.00    Pack years: 14.50    Types: Cigarettes    Start date:  07/27/1964  . Smokeless tobacco: Never Used  Substance Use Topics  . Alcohol use: No    Alcohol/week: 0.0 oz    Family history is   Family History  Problem Relation Age of Onset  . Heart failure Father   . COPD Father   . Arthritis Father 38  . Stroke Mother   . Arthritis Mother 56  . Hyperlipidemia Mother   . Hypertension Mother   . Diabetes Mother   . Diabetes Sister   . Breast cancer Unknown   . Breast cancer Maternal Aunt   . Asthma Maternal Aunt   . Birth defects Maternal Aunt   . Alcohol abuse Maternal Uncle   . Breast cancer Maternal Aunt   . Stomach cancer Neg Hx   . Colon cancer Neg Hx       Review of Systems  DATA OBTAINED: from patient-limited; nursing-no acute concerns; family friend GENERAL:  no fevers, fatigue, appetite changes SKIN: No itching, or rash EYES: No eye pain, redness, discharge EARS: No earache, tinnitus, change in hearing NOSE: No congestion, drainage or bleeding  MOUTH/THROAT: No mouth or tooth pain, No sore throat RESPIRATORY: No cough, wheezing, SOB CARDIAC: No chest pain, palpitations, lower extremity edema  GI: No abdominal pain, No N/V/D or constipation, No heartburn or reflux  GU: No dysuria, frequency or urgency, or incontinence  MUSCULOSKELETAL: No unrelieved bone/joint pain NEUROLOGIC: No headache, dizziness or focal weakness PSYCHIATRIC: Confusion   Vitals:   10/01/17 1428  BP: 128/78  Pulse: 88  Resp: 18  Temp: 97.7 F (36.5 C)  SpO2: 93%    SpO2 Readings from Last 1 Encounters:  10/01/17 93%   Body mass index is 29.29 kg/m.     Physical Exam  GENERAL APPEARANCE: Alert, conversant,  No acute distress.  SKIN: No diaphoresis rash HEAD: Normocephalic, atraumatic  EYES: Conjunctiva/lids clear. Pupils round, reactive. EOMs intact.  EARS: External exam WNL, canals clear. Hearing grossly normal.  NOSE: No deformity or discharge.  MOUTH/THROAT: Lips w/o lesions  RESPIRATORY: Breathing is even, unlabored. Lung  sounds are clear   CARDIOVASCULAR: Heart RRR no murmurs, rubs or gallops. No peripheral edema.   GASTROINTESTINAL: Abdomen is soft, non-tender, not distended w/ normal bowel sounds. GENITOURINARY: Bladder non tender, not distended  MUSCULOSKELETAL: No abnormal joints or musculature NEUROLOGIC:  Cranial nerves 2-12 grossly intact. Moves all extremities  PSYCHIATRIC: Mood and affect appropriate with some confusion, no behavioral issues  Patient Active Problem List  Diagnosis Date Noted  . COPD exacerbation ([place]) 06/25/2017  . Ventricular tachycardia ([place]) 05/22/2017  . Acute kidney injury ([place]) 05/17/2017  . Hyperkalemia 05/17/2017  . Vitamin D deficiency 05/17/2017  . COPD with acute exacerbation ([place]) 05/05/2017  . Urine frequency 04/13/2017  . Osteopenia 03/30/2017  . Dermatitis 03/30/2017  . Allergic state 11/10/2016  . 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 Portal hypertensive gastropathy ([place])   . Lower back injury, initial encounter 08/05/2016  . Fall 07/30/2016  . Preventative health care 03/08/2016  . Muscle spasm 02/25/2016  . Chronic respiratory failure ([place]) 08/29/2015  . NASH (nonalcoholic steatohepatitis) 08/26/2015  . Type 2 diabetes mellitus with hyperglycemia, with long-term current use of insulin ([place])   . Cirrhosis of liver without ascites ([place]) 07/31/2015  . Acute respiratory failure with hypoxia ([place]) 07/31/2015  . Diarrhea 12/30/2014  . Increased ammonia level 11/25/2014  . Diabetes mellitus type 2, controlled ([place]) 10/16/2014  . Encephalopathy, hepatic ([place]) 06/07/2014  . Right knee pain 06/07/2014  . Sun-damaged skin 02/01/2014  . Anxiety and depression 02/01/2014  . Tobacco abuse 02/01/2014  . Medicare annual wellness visit, subsequent 02/01/2014  . Pedal edema 12/10/2013  . Overactive bladder 12/10/2013  . Abdominal aortic aneurysm ([place]) 10/01/2013  . Arthritis of right knee 10/01/2013  . Benign paroxysmal positional vertigo 10/01/2013  . Neck  pain 10/01/2013  . Esophageal reflux 10/01/2013  . Thrombocytopenia ([place]) 03/17/2012  . 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   . Hyperlipidemia, mixed   . Breast cancer [facility])       Labs reviewed: Basic Metabolic Panel:    Component Value Date/Time   NA 140 07/14/2017 1208   NA 141 07/07/2017 1102   NA 139 06/29/2016 1128   K 5.3 (H) 07/14/2017 1208   K 5.8 (H) 07/07/2017 1102   K 5.0 06/29/2016 1128   CL 104 07/14/2017 1208   CL 99 07/07/2017 1102   CO2 33 (H) 07/14/2017 1208   CO2 30 07/07/2017 1102   CO2 24 06/29/2016 1128   GLUCOSE 179 (H) 07/14/2017 1208   GLUCOSE 178 (H) 07/07/2017 1102   BUN 26 (H) 07/14/2017 1208   BUN 22 07/07/2017 1102   BUN 15.9 06/29/2016 1128   CREATININE 1.02 07/14/2017 1208   CREATININE 1.2 07/07/2017 1102   CREATININE 1.1 06/29/2016 1128   CALCIUM 8.6 07/14/2017 1208   CALCIUM 9.0 07/07/2017 1102   CALCIUM 9.0 06/29/2016 1128   PROT 5.5 (L) 07/14/2017 1208   PROT 5.3 (L) 07/07/2017 1102   PROT 5.9 (L) 06/29/2016 1128   ALBUMIN 2.1 (L) 07/14/2017 1208   ALBUMIN 2.4 (L) 07/07/2017 1102   ALBUMIN 2.9 (L) 09/28/2016 1324   ALBUMIN 2.7 (L) 06/29/2016 1128   AST 21 07/14/2017 1208   AST 42 (H) 07/07/2017 1102   AST 36 (H) 06/29/2016 1128   ALT 25 07/14/2017 1208   ALT 44 07/07/2017 1102   ALT 24 06/29/2016 1128   ALKPHOS 107 07/14/2017 1208   ALKPHOS 107 (H) 07/07/2017 1102   ALKPHOS 129 06/29/2016 1128   BILITOT 2.0 (H) 07/14/2017 1208   BILITOT 2.30 (H) 07/07/2017 1102   BILITOT 1.78 (H) 06/29/2016 1128   GFRNONAA 54 (L) 06/30/2017 1038   GFRAA >60 06/30/2017 1038    Recent Labs    05/08/17 0421  06/30/17 1038 07/07/17 1102 07/14/17 1208  NA 137   < > 134* 141 140  K 4.2   < > 4.5 5.8* 5.3*  CL 103   < > 101 99 104  CO2 25   < > 29 30 33*  GLUCOSE 185*   < > 240* 178* 179*  BUN 46*   < > 45* 22 26*  CREATININE 1.03*   < > 1.00 1.2 1.02  CALCIUM 8.4*   < > 8.7* 9.0 8.6  MG 2.0  --    --   --   --    < > = values in this interval not displayed.   Liver Function Tests: Recent Labs    06/26/17 0326 07/07/17 1102 07/14/17 1208  AST 29 42* 21  ALT 25 44 25  ALKPHOS 109 107* 107  BILITOT 3.1* 2.30* 2.0*  PROT 4.9* 5.3* 5.5*  ALBUMIN 2.1* 2.4* 2.1*   No results for input(s): LIPASE, AMYLASE in the last 8760 hours. Recent Labs    03/11/17 1100 05/05/17 1855 05/10/17 1550  AMMONIA 65* 47* 48*   CBC: Recent Labs    06/25/17 1227  06/29/17 0305 07/07/17 1102 07/14/17 1208 09/23/17  WBC 20.3*   < > 13.8* 11.5* 10.0 7.3  NEUTROABS 16.9*  --   --  8.5* 7.4  --   HGB 12.7   < > 11.9* 12.4 12.2 11.8*  HCT 37.2   < > 34.8* 36.7 37.5 36  MCV 96.1   < > 97.5 99 102.3*  --   PLT 108*   < > 117* 83* 111.0* 146*   < > = values in this interval not displayed.   Lipid Recent Labs    11/10/16 1439 02/09/17 1222  CHOL 164 160  HDL 40.60 48.40  LDLCALC 106* 96  TRIG 85.0 78.0    Cardiac Enzymes: Recent Labs    05/05/17 1853  TROPONINI <0.03   BNP: No results for input(s): BNP in the last 8760 hours. Lab Results  Component Value Date   MICROALBUR <0.7 02/25/2016   Lab Results  Component Value Date   HGBA1C 5.9 (H) 06/26/2017   Lab Results  Component Value Date   TSH 1.16 11/10/2016   Lab Results  Component Value Date   VITAMINB12 644 10/16/2014   Lab Results  Component Value Date   FOLATE >20.0 10/16/2014   Lab Results  Component Value Date   IRON 54 07/31/2015   TIBC 344 07/31/2015   FERRITIN 16 07/31/2015    Imaging and Procedures obtained prior to SNF admission: Dg Chest 2 View  Result Date: 07/14/2017 CLINICAL DATA:  Shortness of breath over the last week or 2. EXAM: CHEST  2 VIEW COMPARISON:  06/25/2017 FINDINGS: Left ventricular prominence. Aortic atherosclerosis. Small amount of pleural fluid on the right. Patchy infiltrate in volume loss in both mid and lower lungs, right worse than left. Central bronchial thickening. Biapical  scarring. IMPRESSION: Patchy areas of density and volume loss in both mid and lower lungs worrisome for pneumonia. Small amount of pleural fluid on the right. Electronically Signed   By: Nelson Chimes M.D.   On: 07/14/2017 12:39     Not all labs, radiology exams or other studies done during hospitalization come through on my EPIC note; however they are reviewed by me.    Assessment and Plan  Hepatic encephalopathy/liver cirrhosis secondary to [place]-patient with waxing and waning encephalopathy with ammonia level as high as 214; lactulose increased to 60 mg every 6 hours, that bowel movement limited further increase; patient also on Xifaxan. SNF - admitted for OT/PT, although note mentions that patient has such a  poor overall prognosis that hospice care was discussed with family; no family present at this time; continue lactulose 60 mils every 6 hours  Left leg edema-negative for DVT SNF -patient started on Lasix 40 mg daily with potassium 10 mEq daily  Diabetes mellitus2 SNF - continue insulin detemir 8 units every a.m. and sliding scale insulin; patient not on statin or a/arm  GERD SNF - stable; continue Protonix 20 mg daily  Arthritis SNF - no complaints; continue Celebrex 100 mg every 8 when necessary   Time spent greater than 45 minutes;> 50% of time with patient was spent reviewing records, labs, tests and studies, counseling and developing plan of care  Webb Silversmith D. Sheppard Coil, MD

## 2017-10-02 ENCOUNTER — Encounter: Payer: Self-pay | Admitting: Internal Medicine

## 2017-10-02 DIAGNOSIS — M1990 Unspecified osteoarthritis, unspecified site: Secondary | ICD-10-CM | POA: Insufficient documentation

## 2017-10-02 DIAGNOSIS — R600 Localized edema: Secondary | ICD-10-CM | POA: Insufficient documentation

## 2017-10-04 ENCOUNTER — Non-Acute Institutional Stay (SKILLED_NURSING_FACILITY): Payer: PPO | Admitting: Internal Medicine

## 2017-10-04 ENCOUNTER — Encounter: Payer: Self-pay | Admitting: Internal Medicine

## 2017-10-04 DIAGNOSIS — R600 Localized edema: Secondary | ICD-10-CM

## 2017-10-04 DIAGNOSIS — K7290 Hepatic failure, unspecified without coma: Secondary | ICD-10-CM | POA: Diagnosis not present

## 2017-10-04 DIAGNOSIS — K7682 Hepatic encephalopathy: Secondary | ICD-10-CM

## 2017-10-04 NOTE — Progress Notes (Signed)
Location:  [facility] Room Number: 829H Place of Service:  SNF (31) Noah Delaine. Sheppard Coil, MD  Mosie Lukes, MD  Patient Care Team: Mosie Lukes, MD as PCP - General (Family Medicine) Dene Gentry, MD as Consulting Physician (Sports Medicine) Mauri Pole, MD as Consulting Physician (Gastroenterology) Rigoberto Noel, MD as Consulting Physician (Pulmonary Disease) Parrett, Fonnie Mu, NP as Nurse Practitioner (Pulmonary Disease) Volanda Napoleon, MD as Consulting Physician (Oncology)  Extended Emergency Contact Information Primary Emergency Contact: Lowella Pet[place], [place] Phone: [phone] Mobile Phone: [phone] Relation: Friend Secondary Emergency Contact: Kathryne Hitch Address: [address]          [place], [place] [address] Johnnette Litter of [place] Phone: [phone] Mobile Phone: [phone] Relation: Friend    Allergies: Citalopram; Ciprofloxacin; Erythromycin; Glimepiride; and Prednisone  Chief Complaint  Patient presents with  . Acute Visit    diarrhea    HPI: Patient is 77 y.o. female with cirrhosis of the liver among other problems who is being seen in for several reasons. One is that she wants to get her dose of lactulose decreased. She says she is having too many bowel  Movements daily. Today she had 6 in the morning and 6 this afternoon.she also has increased lower extremity edema as noted by nursing for several days. Patient denies shortness of breath or chest pain. My concern about lowering patient's lactulose is encephalopathy so we'll probably need to add back her rifaximin.  Past Medical History:  Diagnosis Date  . Abdominal aortic aneurysm ([place]) 10/01/2013   Fall of 2014 3.3 per patient, follows with Vascular surgeon.   . Acute respiratory failure with hypoxia ([place]) 07/31/2015  . Allergic state 11/10/2016  . Anxiety   . Anxiety and depression 02/01/2014  . Arthritis  of both knees 10/01/2013  . Arthritis of right knee 10/01/2013   Follows with Dr Mayer Camel   . Benign paroxysmal positional vertigo 10/01/2013  . Breast cancer ([place])    No disease activity On Femara Follows with Dr Marin Olp Right mastectomy performed by Dr Autumn Messing   . Cancer [facility]) breast ca  right  . Chronic respiratory failure ([place]) 08/29/2015  . Cirrhosis of liver without ascites ([place]) 07/31/2015  . COPD (chronic obstructive pulmonary disease) ([place]) 10/01/2013  . COPD with acute exacerbation ([place]) 05/05/2017  . Depression   . Dermatitis 03/30/2017  . Diabetes mellitus type 2  . Diabetes mellitus type 2, controlled ([place]) 10/16/2014  . Emphysema   . Encephalopathy, hepatic ([place]) 06/07/2014  . Esophageal reflux 10/01/2013  . Fall 07/30/2016  . Hyperlipidemia   . Hyperlipidemia, mixed   . Increased ammonia level 11/25/2014  . NASH (nonalcoholic steatohepatitis) 08/26/2015  . Neck pain 10/01/2013  . Neuropathy    feet   . Osteopenia 03/30/2017  . Overactive bladder 12/10/2013  . Panic attacks   . Pedal edema 12/10/2013  . Personal history of radiation therapy   . Preventative health care 03/08/2016  . Thrombocytopenia ([place]) 03/17/2012  . Tobacco abuse disorder 02/01/2014  . Type 2 diabetes mellitus with hyperglycemia, with long-term current use of insulin ([place])   . Urine frequency 04/13/2017    Past Surgical History:  Procedure Laterality Date  . APPENDECTOMY  2007  . BREAST SURGERY  2009 right  . CATARACT EXTRACTION     x 2  . ESOPHAGOGASTRODUODENOSCOPY (EGD) WITH PROPOFOL N/A 08/14/2016  Procedure: ESOPHAGOGASTRODUODENOSCOPY (EGD) WITH PROPOFOL;  Surgeon: Mauri Pole, MD;  Location: [facility];  Service: Endoscopy;  Laterality: N/A;  . [place]  . KNEE SURGERY    . MANDIBLE FRACTURE SURGERY    . MASTECTOMY    . PILONIDAL CYST EXCISION    . TONSILLECTOMY      Allergies as of 10/04/2017      Reactions   Citalopram Palpitations   Irregular heart beat Irregular heart beat    Ciprofloxacin Other (See Comments)   Mental status change Mental status change   Erythromycin Other (See Comments)   Stomach cramps Stomach cramps   Glimepiride Other (See Comments)   Elevated ammonia levels Elevated ammonia levels   Prednisone    Increased blood sugars too high      Medication List        Accurate as of 10/04/17 11:59 PM. Always use your most recent med list.          acetaminophen 325 MG tablet Commonly known as:  TYLENOL Take 650 mg by mouth every 6 (six) hours as needed for mild pain or moderate pain.   albuterol 0.63 MG/3ML nebulizer solution Commonly known as:  ACCUNEB Take 1 ampule by nebulization every 6 (six) hours as needed for shortness of breath.   celecoxib 100 MG capsule Commonly known as:  CELEBREX Take 100 mg by mouth every 8 (eight) hours as needed for mild pain.   FLONASE ALLERGY RELIEF 50 MCG/ACT nasal spray Generic drug:  fluticasone Place 1 spray into both nostrils daily.   furosemide 20 MG tablet Commonly known as:  LASIX Take 20 mg by mouth daily. Start taking on:  10/14/2017   hydrocortisone cream 1 % Apply 1 application topically daily as needed for itching. Apply rectally as needed   insulin aspart 100 UNIT/ML FlexPen Commonly known as:  NOVOLOG Inject 0-15 Units into the skin 3 (three) times daily with meals. CBG ac & hs with SSI 70 -120= 0 units ,121-150=2units,151-200=3units,201-250=5units,251-300=8 units,301-350=11units,351-400=15units greater than 400 call MD and give 15units   lactulose 10 GM/15ML solution Commonly known as:  CHRONULAC Take 60 g by mouth every 6 (six) hours.   LANTUS 100 UNIT/ML injection Generic drug:  insulin glargine Inject 8 Units into the skin See admin instructions. 8 units QAM and 8 units QHS   magnesium oxide 400 MG tablet Commonly known as:  MAG-OX Take 400 mg by mouth 2 (two) times daily.   mometasone-formoterol 100-5 MCG/ACT Aero Commonly known as:  DULERA Inhale 2 puffs into the  lungs 2 (two) times daily.   pantoprazole 20 MG tablet Commonly known as:  PROTONIX Take 20 mg by mouth daily.   polyvinyl alcohol 1.4 % ophthalmic solution Commonly known as:  LUBRICANT DROPS Place 1 drop into both eyes as needed (dry eyes).   potassium chloride 10 MEQ tablet Commonly known as:  K-DUR Take 10 mEq by mouth daily.   sodium chloride 0.65 % Soln nasal spray Commonly known as:  OCEAN Place 2 sprays into both nostrils 2 (two) times daily as needed for congestion.       No orders of the defined types were placed in this encounter.   Immunization History  Administered Date(s) Administered  . Influenza Split 04/09/2011, 03/27/2012, 03/27/2013  . Influenza, High Dose Seasonal PF 03/30/2017  . Influenza,inj,Quad PF,6+ Mos 03/26/2015  . Influenza-Unspecified 04/24/2014, 04/23/2016, 03/30/2017  . Pneumococcal Conjugate-13 02/01/2014  . Pneumococcal Polysaccharide-23 04/09/2011  . Td 07/27/2010  . Tdap 05/08/2014  Social History   Tobacco Use  . Smoking status: Former Smoker    Packs/day: 0.25    Years: 58.00    Pack years: 14.50    Types: Cigarettes    Start date: 07/27/1964  . Smokeless tobacco: Never Used  Substance Use Topics  . Alcohol use: No    Alcohol/week: 0.0 oz    Review of Systems  DATA OBTAINED: from patient, nurse GENERAL:  no fevers, fatigue, appetite changes SKIN: No itching, rash HEENT: No complaint RESPIRATORY: No cough, wheezing, SOB CARDIAC: No chest pain, palpitations, lower extremity edema  GI: No abdominal pain, No N/V or constipation, No heartburn or reflux ; many bowel movements GU: No dysuria, frequency or urgency, or incontinence  MUSCULOSKELETAL: No unrelieved bone/joint pain NEUROLOGIC: No headache, dizziness  PSYCHIATRIC: No overt anxiety or sadness  Vitals:   10/04/17 1451  BP: 122/76  Pulse: 67  Resp: 18  Temp: 98 F (36.7 C)  SpO2: 96%   Body mass index is 28.41 kg/m. Physical Exam  GENERAL APPEARANCE:  Alert, conversant, No acute distress  SKIN: No diaphoresis rash HEENT: Unremarkable RESPIRATORY: Breathing is even, unlabored. Lung sounds are clear   CARDIOVASCULAR: Heart RRR no murmurs, rubs or gallops. 2+ peripheral edema  GASTROINTESTINAL: Abdomen is soft, non-tender, not distended w/ normal bowel sounds; no ascites.  GENITOURINARY: Bladder non tender, not distended  MUSCULOSKELETAL: No abnormal joints or musculature NEUROLOGIC: Cranial nerves 2-12 grossly intact. Moves all extremities PSYCHIATRIC: Mood and affect appropriate to situation, no behavioral issues  Patient Active Problem List   Diagnosis Date Noted  . Leg edema, left 10/02/2017  . Arthritis 10/02/2017  . COPD exacerbation ([place]) 06/25/2017  . Ventricular tachycardia ([place]) 05/22/2017  . Acute kidney injury ([place]) 05/17/2017  . Hyperkalemia 05/17/2017  . Vitamin D deficiency 05/17/2017  . COPD with acute exacerbation ([place]) 05/05/2017  . Urine frequency 04/13/2017  . Osteopenia 03/30/2017  . Dermatitis 03/30/2017  . Allergic state 11/10/2016  . 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 Portal hypertensive gastropathy ([place])   . Lower back injury, initial encounter 08/05/2016  . Fall 07/30/2016  . Preventative health care 03/08/2016  . Muscle spasm 02/25/2016  . Chronic respiratory failure ([place]) 08/29/2015  . NASH (nonalcoholic steatohepatitis) 08/26/2015  . Type 2 diabetes mellitus with hyperglycemia, with long-term current use of insulin ([place])   . Cirrhosis of liver without ascites ([place]) 07/31/2015  . Acute respiratory failure with hypoxia ([place]) 07/31/2015  . Diarrhea 12/30/2014  . Increased ammonia level 11/25/2014  . Diabetes mellitus type 2, controlled ([place]) 10/16/2014  . Encephalopathy, hepatic ([place]) 06/07/2014  . Right knee pain 06/07/2014  . Sun-damaged skin 02/01/2014  . Anxiety and depression 02/01/2014  . Tobacco abuse 02/01/2014  . Medicare annual wellness visit, subsequent 02/01/2014  . Pedal edema  12/10/2013  . Overactive bladder 12/10/2013  . Abdominal aortic aneurysm ([place]) 10/01/2013  . Arthritis of right knee 10/01/2013  . Benign paroxysmal positional vertigo 10/01/2013  . Neck pain 10/01/2013  . Esophageal reflux 10/01/2013  . Thrombocytopenia ([place]) 03/17/2012  . 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   . Hyperlipidemia, mixed   . Breast cancer (HCC)     CMP     Component Value Date/Time   NA 140 07/14/2017 1208   NA 141 07/07/2017 1102   NA 139 06/29/2016 1128   K 5.3 (H) 07/14/2017 1208   K 5.8 (H) 07/07/2017 1102   K 5.0 06/29/2016 1128   CL 104 07/14/2017 1208  CL 99 07/07/2017 1102   CO2 33 (H) 07/14/2017 1208   CO2 30 07/07/2017 1102   CO2 24 06/29/2016 1128   GLUCOSE 179 (H) 07/14/2017 1208   GLUCOSE 178 (H) 07/07/2017 1102   BUN 26 (H) 07/14/2017 1208   BUN 22 07/07/2017 1102   BUN 15.9 06/29/2016 1128   CREATININE 1.02 07/14/2017 1208   CREATININE 1.2 07/07/2017 1102   CREATININE 1.1 06/29/2016 1128   CALCIUM 8.6 07/14/2017 1208   CALCIUM 9.0 07/07/2017 1102   CALCIUM 9.0 06/29/2016 1128   PROT 5.5 (L) 07/14/2017 1208   PROT 5.3 (L) 07/07/2017 1102   PROT 5.9 (L) 06/29/2016 1128   ALBUMIN 2.1 (L) 07/14/2017 1208   ALBUMIN 2.4 (L) 07/07/2017 1102   ALBUMIN 2.9 (L) 09/28/2016 1324   ALBUMIN 2.7 (L) 06/29/2016 1128   AST 21 07/14/2017 1208   AST 42 (H) 07/07/2017 1102   AST 36 (H) 06/29/2016 1128   ALT 25 07/14/2017 1208   ALT 44 07/07/2017 1102   ALT 24 06/29/2016 1128   ALKPHOS 107 07/14/2017 1208   ALKPHOS 107 (H) 07/07/2017 1102   ALKPHOS 129 06/29/2016 1128   BILITOT 2.0 (H) 07/14/2017 1208   BILITOT 2.30 (H) 07/07/2017 1102   BILITOT 1.78 (H) 06/29/2016 1128   GFRNONAA 54 (L) 06/30/2017 1038   GFRAA >60 06/30/2017 1038   Recent Labs    05/08/17 0421  06/30/17 1038 07/07/17 1102 07/14/17 1208  NA 137   < > 134* 141 140  K 4.2   < > 4.5 5.8* 5.3*  CL 103   < > 101 99 104  CO2 25   < > 29 30 33*    GLUCOSE 185*   < > 240* 178* 179*  BUN 46*   < > 45* 22 26*  CREATININE 1.03*   < > 1.00 1.2 1.02  CALCIUM 8.4*   < > 8.7* 9.0 8.6  MG 2.0  --   --   --   --    < > = values in this interval not displayed.   Recent Labs    06/26/17 0326 07/07/17 1102 07/14/17 1208  AST 29 42* 21  ALT 25 44 25  ALKPHOS 109 107* 107  BILITOT 3.1* 2.30* 2.0*  PROT 4.9* 5.3* 5.5*  ALBUMIN 2.1* 2.4* 2.1*   Recent Labs    06/25/17 1227  06/29/17 0305 07/07/17 1102 07/14/17 1208 09/23/17  WBC 20.3*   < > 13.8* 11.5* 10.0 7.3  NEUTROABS 16.9*  --   --  8.5* 7.4  --   HGB 12.7   < > 11.9* 12.4 12.2 11.8*  HCT 37.2   < > 34.8* 36.7 37.5 36  MCV 96.1   < > 97.5 99 102.3*  --   PLT 108*   < > 117* 83* 111.0* 146*   < > = values in this interval not displayed.   Recent Labs    11/10/16 1439 02/09/17 1222  CHOL 164 160  LDLCALC 106* 96  TRIG 85.0 78.0   Lab Results  Component Value Date   MICROALBUR <0.7 02/25/2016   Lab Results  Component Value Date   TSH 1.16 11/10/2016   Lab Results  Component Value Date   HGBA1C 5.9 (H) 06/26/2017   Lab Results  Component Value Date   CHOL 160 02/09/2017   HDL 48.40 02/09/2017   LDLCALC 96 02/09/2017   TRIG 78.0 02/09/2017   CHOLHDL 3 02/09/2017    Significant Diagnostic Results  in last 30 days:  No results found.  Assessment and Plan  Lactulose use/history of hepatic encephalopathy-patient is having more bowel movements and she can stand; I'm now at the hospital the lactulose was increased as far as patient's bowel movements would allow and they wanted to go higher; stress starting patient on rifaximin again; she says she has a bottle at home and she also is on a program to help her pay for the medication which is very expensive so she is going to bring her bottle to the nursing home and will work out getting her next bottle; will decrease her lactulose to 45 g every 6 and start her right rifaximin 550 mg twice a day  Lower extremity  edema-patient is currently on 40 mg Lasix daily and edema is worse than prior; we'll increase to Lasix 80 mg daily and start BMP weekly will continue spironolactone 25 mg daily    Time spent Greater than 35 minutes Defne Gerling D. Sheppard Coil, MD

## 2017-10-05 ENCOUNTER — Encounter: Payer: Self-pay | Admitting: *Deleted

## 2017-10-05 ENCOUNTER — Other Ambulatory Visit: Payer: Self-pay | Admitting: *Deleted

## 2017-10-05 NOTE — Patient Outreach (Signed)
[facility]) Care Management  10/05/2017  Ashley Savage [birth year] [id number]   CSW made a third and final attempt to try and contact patient today to perform phone assessment, as well as assess and assist with social work needs and services, without success.  A HIPAA compliant message was left for patient on voicemail.  CSW will proceed with case closure, as required number of phone attempts have been made and an outreach letter was mailed to patient's home allowing 10 business days for a response.   Nat Christen, BSW, MSW, LCSW  Licensed Education officer, [facility]  Mailing [place] [address] [place], [place] [address] Physical Address-[address], [place], [place] [address] Toll Free Main # [phone] Fax # [phone] Cell # [phone]  Office # [phone] [email or web address]

## 2017-10-06 DIAGNOSIS — R531 Weakness: Secondary | ICD-10-CM | POA: Diagnosis not present

## 2017-10-07 ENCOUNTER — Other Ambulatory Visit: Payer: Self-pay | Admitting: Licensed Clinical Social Worker

## 2017-10-07 LAB — AMMONIA: Ammonia: 65

## 2017-10-07 NOTE — Patient Outreach (Signed)
[facility] Care Management  [facility] Social Work  10/07/2017  Ashley Savage 01-29-1941 [id number]  Encounter Medications:  Outpatient Encounter Medications as of 10/07/2017  Medication Sig  . acetaminophen (TYLENOL) 325 MG tablet Take 650 mg by mouth every 6 (six) hours as needed for mild pain or moderate pain.  Marland Kitchen albuterol (ACCUNEB) 0.63 MG/3ML nebulizer solution Take 1 ampule by nebulization every 6 (six) hours as needed for shortness of breath.  . celecoxib (CELEBREX) 100 MG capsule Take 100 mg by mouth every 8 (eight) hours as needed for mild pain.  . fluticasone (FLONASE ALLERGY RELIEF) 50 MCG/ACT nasal spray Place 1 spray into both nostrils daily.  Derrill Memo ON 10/14/2017] furosemide (LASIX) 20 MG tablet Take 20 mg by mouth daily.  . hydrocortisone cream 1 % Apply 1 application topically daily as needed for itching. Apply rectally as needed  . insulin aspart (NOVOLOG) 100 UNIT/ML FlexPen Inject 0-15 Units into the skin 3 (three) times daily with meals. CBG ac & hs with SSI 70 -120= 0 units ,121-150=2units,151-200=3units,201-250=5units,251-300=8 units,301-350=11units,351-400=15units greater than 400 call MD and give 15units  . insulin glargine (LANTUS) 100 UNIT/ML injection Inject 8 Units into the skin See admin instructions. 8 units QAM and 8 units QHS  . lactulose (CHRONULAC) 10 GM/15ML solution Take 60 g by mouth every 6 (six) hours.   . magnesium oxide (MAG-OX) 400 MG tablet Take 400 mg by mouth 2 (two) times daily.  . mometasone-formoterol (DULERA) 100-5 MCG/ACT AERO Inhale 2 puffs into the lungs 2 (two) times daily.  . pantoprazole (PROTONIX) 20 MG tablet Take 20 mg by mouth daily.  . polyvinyl alcohol (LUBRICANT DROPS) 1.4 % ophthalmic solution Place 1 drop into both eyes as needed (dry eyes).  . potassium chloride (K-DUR) 10 MEQ tablet Take 10 mEq by mouth daily.  . sodium chloride (OCEAN) 0.65 % SOLN nasal spray Place 2 sprays into both nostrils 2 (two) times daily  as needed for congestion.   No facility-administered encounter medications on file as of 10/07/2017.     Functional Status:  In your present state of health, do you have any difficulty performing the following activities: 10/07/2017 06/26/2017  Hearing? N -  Vision? N -  Difficulty concentrating or making decisions? N -  Walking or climbing stairs? Y -  Dressing or bathing? Y -  Doing errands, shopping? Y N  Preparing Food and eating ? - -  Using the Toilet? - -  In the past six months, have you accidently leaked urine? - -  Do you have problems with loss of bowel control? - -  Managing your Medications? - -  Managing your Finances? - -  Housekeeping or managing your Housekeeping? - -  Some recent data might be hidden    Fall/Depression Screening:  PHQ 2/9 Scores 10/07/2017 05/18/2017 11/20/2016 10/22/2016 05/04/2016 02/25/2016 02/08/2015  PHQ - 2 Score 0 0 0 1 0 0 1    Assessment: CSW received new referral on patient on 10/06/17 as [facility] was made aware that patient discharged from [facility] to [facility]. CSW completed initial visit and assessment with patient on 10/07/17 at [facility] Patient is a 77 year old single female with a history of COPD, type 2 diabetes and liver cirrhosis. After admission patient become more disoriented and given her overall poor prognosis and discussion with family, the best disposition was thought to be hospice care. However, patient [facility] with [facility] yesterday to discuss palliative care services not hospice. Patient unsure if she is currently active with palliative care services or not. Patient reports that she continues to struggle with SOB. However, patient denies any current pain or discomfort. Patient reports ongoing fluid in both of her legs but that her right is worse than her left. Patient admits that her appetite "is not the best in the world." Patient shares that was "very  independent" before her most recent hospitalizations and that she would provide her own stable transportation to all of her medical appointments and would get her own groceries and prepare her meals herself. Patient shares that she lived alone in an apartment in [place], [place] prior to her most recent hospitalization but that her lease is about to run out. CSW questioned what her plans were post SNF discharge and she reports that her niece is assisting her with applying for Medicaid but that she is not sure if she is going to apply for LTC Medicaid or Regular Adult Medicaid. Patient admits that she is unsure "what to do" once she leaves SNF. Patient was strongly encouraged to consider creating an action plan to ensure a safe and stable discharge from SNF to wherever she will be going (ILF, apartment, ALF, etc.) Patient shares that her main support is her niece Tye [place]. Patient reports that she quit smoking cigarettes 6 weeks ago. Positive reinforcement provided for this accomplishment! Patient denies any current issues with depression and denies needing mental health resources at this time. Patient shares that she has a MOST form in place and does not wish to make any further adjustments or changes her to her advance directives.   CSW completed call to SNF social worker Marita [place] as she was not in her office when CSW was there but was unable to reach her by phone as well. CSW left a message for her with another staff employee requesting a return call with updates on patient.    [facility] CM Care Plan Problem One     Most Recent Value  Care Plan Problem One  Hospital admission which led to short term SNF placement  Role Documenting the Problem One  Clinical Social Worker  Care Plan for Problem One  Active  [facility] Long Term Goal   Patient will have a safe and stable discharge back home or to another facility within 90 days as evidenced by patient self report  [facility] Long Term Goal Start Date  10/07/17  Interventions for  Problem One Long Term Goal  CSW will coordinate care with patient, family and SNF social worker as needed to help ensure that patient has all needs met and has gained services, medical equipment and community resources to make sure a stable transition takes place post SNF discharge.  [facility] CM Short Term Goal #1   Pt will attend all scheduled medical appointments over the next 30 days per pt self report  [facility] CM Short Term Goal #1 Start Date  10/07/17  Interventions for Short Term Goal #1  CSW provided patient with [facility] Calendar to help keep up with all future appointments. CSW will monitor goal and support patient in meeting this goal as needed  [facility] CM Short Term Goal #2   Pt will participate in PT and OT at SNF over the next 30 days per patient self report  [facility] CM Short Term Goal #2 Start Date  10/07/17  Interventions for Short Term Goal #2  CSW provided motivational interviewing intervention to help support  and motivate patient to meet desired goal. CSW will monitor goal and support patient      Plan:CSW will send involvement letter to PCP and route entire encounter to PCP as well. CSW will follow up with SNF within two weeks and will await for SNF discharge.  [name], BSW, MSW, [email or web address] Phone: [phone] Fax: [phone]

## 2017-10-08 ENCOUNTER — Other Ambulatory Visit: Payer: Self-pay | Admitting: Licensed Clinical Social Worker

## 2017-10-08 NOTE — Patient Outreach (Signed)
[facility]) Care Management  10/08/2017  Ashley Savage 11/03/1940 [id number]  Assessment- CSW met with [name] social worker Marita [place] on 10/08/17 to coordinate care for patient. CSW was made aware that patient has completed HCPOA paperwork and put Ashley Savage (her niece) down as her HCPOA. Chart updated. No further updates at this time but SNF social worker did mention the possibility of LTC placement there for patient.  Plan-CSW will follow up within two weeks and continue to follow patient while at SNF and provide social work assistance and support as well.   [name], BSW, MSW, [email or web address] Phone: [phone] Fax: [phone]

## 2017-10-11 ENCOUNTER — Encounter: Payer: Self-pay | Admitting: Internal Medicine

## 2017-10-12 ENCOUNTER — Other Ambulatory Visit: Payer: Self-pay | Admitting: Licensed Clinical Social Worker

## 2017-10-12 NOTE — Patient Outreach (Signed)
[facility]) Care Management  10/12/2017  MIRIAH MARUYAMA [birth year] [id number]  Assessment- CSW arrived at [facility] SNF to complete visit. Patient was not in her room and nurse informed [facility] CSW that patient may be in the activity room or the cafeteria. CSW found patient in the activity room where she had just started PT. CSW was informed that patient had OT after PT and would not be available for another hour. CSW unable to complete SNF visit but will attempt another SNF visit within one week.  Plan-CSW will re-attempt SNF visit within one week.   [name], BSW, MSW, [email or web address] Phone: [phone] Fax: [phone]

## 2017-10-13 DIAGNOSIS — R531 Weakness: Secondary | ICD-10-CM | POA: Diagnosis not present

## 2017-10-14 ENCOUNTER — Encounter: Payer: Self-pay | Admitting: *Deleted

## 2017-10-15 ENCOUNTER — Encounter: Payer: Self-pay | Admitting: *Deleted

## 2017-10-15 LAB — COMPLETE METABOLIC PANEL WITH GFR
ALK PHOS: 191
ALT: 19
AST: 28
Albumin: 2.2
BILIRUBIN TOTAL: 1.2
BUN: 14 (ref 4–21)
CALCIUM: 8.5
CREATININE: 0.89
Glucose: 175
Potassium: 4.8
Sodium: 140
Total Protein: 5.1 g/dL

## 2017-10-18 ENCOUNTER — Inpatient Hospital Stay (HOSPITAL_COMMUNITY): Payer: PPO

## 2017-10-18 ENCOUNTER — Inpatient Hospital Stay (HOSPITAL_COMMUNITY)
Admission: EM | Admit: 2017-10-18 | Discharge: 2017-10-22 | DRG: 871 | Disposition: A | Discharge status: Skilled Nursing Facility | Payer: PPO | Attending: Nephrology | Admitting: Nephrology

## 2017-10-18 ENCOUNTER — Other Ambulatory Visit: Payer: Self-pay

## 2017-10-18 ENCOUNTER — Encounter (HOSPITAL_COMMUNITY): Payer: Self-pay | Admitting: Emergency Medicine

## 2017-10-18 ENCOUNTER — Emergency Department (HOSPITAL_COMMUNITY): Payer: PPO

## 2017-10-18 DIAGNOSIS — J440 Chronic obstructive pulmonary disease with acute lower respiratory infection: Secondary | ICD-10-CM | POA: Diagnosis not present

## 2017-10-18 DIAGNOSIS — F419 Anxiety disorder, unspecified: Secondary | ICD-10-CM

## 2017-10-18 DIAGNOSIS — F329 Major depressive disorder, single episode, unspecified: Secondary | ICD-10-CM | POA: Diagnosis present

## 2017-10-18 DIAGNOSIS — C50019 Malignant neoplasm of nipple and areola, unspecified female breast: Secondary | ICD-10-CM | POA: Diagnosis not present

## 2017-10-18 DIAGNOSIS — G629 Polyneuropathy, unspecified: Secondary | ICD-10-CM | POA: Diagnosis present

## 2017-10-18 DIAGNOSIS — J9622 Acute and chronic respiratory failure with hypercapnia: Secondary | ICD-10-CM | POA: Diagnosis present

## 2017-10-18 DIAGNOSIS — J189 Pneumonia, unspecified organism: Secondary | ICD-10-CM | POA: Diagnosis not present

## 2017-10-18 DIAGNOSIS — J441 Chronic obstructive pulmonary disease with (acute) exacerbation: Secondary | ICD-10-CM | POA: Diagnosis not present

## 2017-10-18 DIAGNOSIS — Z66 Do not resuscitate: Secondary | ICD-10-CM | POA: Diagnosis present

## 2017-10-18 DIAGNOSIS — A419 Sepsis, unspecified organism: Principal | ICD-10-CM

## 2017-10-18 DIAGNOSIS — E1165 Type 2 diabetes mellitus with hyperglycemia: Secondary | ICD-10-CM | POA: Diagnosis not present

## 2017-10-18 DIAGNOSIS — K219 Gastro-esophageal reflux disease without esophagitis: Secondary | ICD-10-CM | POA: Diagnosis present

## 2017-10-18 DIAGNOSIS — I509 Heart failure, unspecified: Secondary | ICD-10-CM | POA: Diagnosis not present

## 2017-10-18 DIAGNOSIS — K7581 Nonalcoholic steatohepatitis (NASH): Secondary | ICD-10-CM | POA: Diagnosis present

## 2017-10-18 DIAGNOSIS — D696 Thrombocytopenia, unspecified: Secondary | ICD-10-CM | POA: Diagnosis present

## 2017-10-18 DIAGNOSIS — Z923 Personal history of irradiation: Secondary | ICD-10-CM

## 2017-10-18 DIAGNOSIS — F410 Panic disorder [episodic paroxysmal anxiety] without agoraphobia: Secondary | ICD-10-CM | POA: Diagnosis not present

## 2017-10-18 DIAGNOSIS — J9690 Respiratory failure, unspecified, unspecified whether with hypoxia or hypercapnia: Secondary | ICD-10-CM | POA: Diagnosis not present

## 2017-10-18 DIAGNOSIS — Z9981 Dependence on supplemental oxygen: Secondary | ICD-10-CM | POA: Diagnosis not present

## 2017-10-18 DIAGNOSIS — Z79899 Other long term (current) drug therapy: Secondary | ICD-10-CM

## 2017-10-18 DIAGNOSIS — K766 Portal hypertension: Secondary | ICD-10-CM | POA: Diagnosis not present

## 2017-10-18 DIAGNOSIS — E782 Mixed hyperlipidemia: Secondary | ICD-10-CM | POA: Diagnosis not present

## 2017-10-18 DIAGNOSIS — K7460 Unspecified cirrhosis of liver: Secondary | ICD-10-CM | POA: Diagnosis present

## 2017-10-18 DIAGNOSIS — E872 Acidosis: Secondary | ICD-10-CM | POA: Diagnosis present

## 2017-10-18 DIAGNOSIS — N179 Acute kidney failure, unspecified: Secondary | ICD-10-CM | POA: Diagnosis present

## 2017-10-18 DIAGNOSIS — G9341 Metabolic encephalopathy: Secondary | ICD-10-CM | POA: Diagnosis not present

## 2017-10-18 DIAGNOSIS — J9621 Acute and chronic respiratory failure with hypoxia: Secondary | ICD-10-CM | POA: Diagnosis not present

## 2017-10-18 DIAGNOSIS — R6521 Severe sepsis with septic shock: Secondary | ICD-10-CM | POA: Diagnosis present

## 2017-10-18 DIAGNOSIS — R0602 Shortness of breath: Secondary | ICD-10-CM

## 2017-10-18 DIAGNOSIS — J449 Chronic obstructive pulmonary disease, unspecified: Secondary | ICD-10-CM | POA: Diagnosis not present

## 2017-10-18 DIAGNOSIS — Y95 Nosocomial condition: Secondary | ICD-10-CM | POA: Diagnosis present

## 2017-10-18 DIAGNOSIS — Z87891 Personal history of nicotine dependence: Secondary | ICD-10-CM

## 2017-10-18 DIAGNOSIS — J9600 Acute respiratory failure, unspecified whether with hypoxia or hypercapnia: Secondary | ICD-10-CM | POA: Diagnosis not present

## 2017-10-18 DIAGNOSIS — Z853 Personal history of malignant neoplasm of breast: Secondary | ICD-10-CM

## 2017-10-18 DIAGNOSIS — E1142 Type 2 diabetes mellitus with diabetic polyneuropathy: Secondary | ICD-10-CM | POA: Diagnosis not present

## 2017-10-18 DIAGNOSIS — J810 Acute pulmonary edema: Secondary | ICD-10-CM | POA: Diagnosis not present

## 2017-10-18 DIAGNOSIS — I10 Essential (primary) hypertension: Secondary | ICD-10-CM | POA: Diagnosis present

## 2017-10-18 DIAGNOSIS — Z794 Long term (current) use of insulin: Secondary | ICD-10-CM | POA: Diagnosis not present

## 2017-10-18 DIAGNOSIS — M1710 Unilateral primary osteoarthritis, unspecified knee: Secondary | ICD-10-CM | POA: Diagnosis not present

## 2017-10-18 DIAGNOSIS — K3189 Other diseases of stomach and duodenum: Secondary | ICD-10-CM | POA: Diagnosis present

## 2017-10-18 DIAGNOSIS — F32A Depression, unspecified: Secondary | ICD-10-CM | POA: Diagnosis present

## 2017-10-18 DIAGNOSIS — C50919 Malignant neoplasm of unspecified site of unspecified female breast: Secondary | ICD-10-CM | POA: Diagnosis present

## 2017-10-18 DIAGNOSIS — M6281 Muscle weakness (generalized): Secondary | ICD-10-CM | POA: Diagnosis not present

## 2017-10-18 DIAGNOSIS — K7682 Hepatic encephalopathy: Secondary | ICD-10-CM | POA: Diagnosis present

## 2017-10-18 DIAGNOSIS — K7290 Hepatic failure, unspecified without coma: Secondary | ICD-10-CM | POA: Diagnosis not present

## 2017-10-18 DIAGNOSIS — F0390 Unspecified dementia without behavioral disturbance: Secondary | ICD-10-CM | POA: Diagnosis present

## 2017-10-18 DIAGNOSIS — R488 Other symbolic dysfunctions: Secondary | ICD-10-CM | POA: Diagnosis not present

## 2017-10-18 DIAGNOSIS — R0603 Acute respiratory distress: Secondary | ICD-10-CM

## 2017-10-18 DIAGNOSIS — Z9181 History of falling: Secondary | ICD-10-CM | POA: Diagnosis not present

## 2017-10-18 DIAGNOSIS — Z9289 Personal history of other medical treatment: Secondary | ICD-10-CM

## 2017-10-18 DIAGNOSIS — E785 Hyperlipidemia, unspecified: Secondary | ICD-10-CM | POA: Diagnosis not present

## 2017-10-18 DIAGNOSIS — R4182 Altered mental status, unspecified: Secondary | ICD-10-CM | POA: Diagnosis not present

## 2017-10-18 LAB — COMPREHENSIVE METABOLIC PANEL
ALT: 22 U/L (ref 14–54)
ANION GAP: 13 (ref 5–15)
AST: 40 U/L (ref 15–41)
Albumin: 2.3 g/dL — ABNORMAL LOW (ref 3.5–5.0)
Alkaline Phosphatase: 187 U/L — ABNORMAL HIGH (ref 38–126)
BILIRUBIN TOTAL: 3.8 mg/dL — AB (ref 0.3–1.2)
BUN: 23 mg/dL — ABNORMAL HIGH (ref 6–20)
CO2: 27 mmol/L (ref 22–32)
Calcium: 9 mg/dL (ref 8.9–10.3)
Chloride: 100 mmol/L — ABNORMAL LOW (ref 101–111)
Creatinine, Ser: 0.93 mg/dL (ref 0.44–1.00)
GFR calc Af Amer: >60 mL/min (ref >60)
GFR, EST NON AFRICAN AMERICAN: 59 mL/min — AB (ref >60)
Glucose, Bld: 152 mg/dL — ABNORMAL HIGH (ref 65–99)
POTASSIUM: 4 mmol/L (ref 3.5–5.1)
Sodium: 140 mmol/L (ref 135–145)
TOTAL PROTEIN: 5.7 g/dL — AB (ref 6.5–8.1)

## 2017-10-18 LAB — GLUCOSE, CAPILLARY
GLUCOSE-CAPILLARY: 253 mg/dL — AB (ref 65–99)
Glucose-Capillary: 230 mg/dL — ABNORMAL HIGH (ref 65–99)
Glucose-Capillary: 205 mg/dL — ABNORMAL HIGH (ref 65–99)

## 2017-10-18 LAB — CBC WITH DIFFERENTIAL/PLATELET
Basophils Absolute: 0 10*3/uL (ref 0.0–0.1)
Basophils Relative: 0 %
Eosinophils Absolute: 0 10*3/uL (ref 0.0–0.7)
Eosinophils Relative: 0 %
HEMATOCRIT: 39.7 % (ref 36.0–46.0)
Hemoglobin: 13 g/dL (ref 12.0–15.0)
LYMPHS PCT: 6 %
Lymphs Abs: 1.4 10*3/uL (ref 0.7–4.0)
MCH: 31.8 pg (ref 26.0–34.0)
MCHC: 32.7 g/dL (ref 30.0–36.0)
MCV: 97.1 fL (ref 78.0–100.0)
MONO ABS: 2 10*3/uL — AB (ref 0.1–1.0)
MONOS PCT: 8 %
NEUTROS ABS: 21 10*3/uL — AB (ref 1.7–7.7)
Neutrophils Relative %: 86 %
Platelets: 131 10*3/uL — ABNORMAL LOW (ref 150–400)
RBC: 4.09 MIL/uL (ref 3.87–5.11)
RDW: 15.4 % (ref 11.5–15.5)
WBC: 24.5 10*3/uL — ABNORMAL HIGH (ref 4.0–10.5)

## 2017-10-18 LAB — BLOOD GAS, ARTERIAL
ACID-BASE DEFICIT: 11.8 mmol/L — AB (ref 0.0–2.0)
Acid-base deficit: 6.5 mmol/L — ABNORMAL HIGH (ref 0.0–2.0)
BICARBONATE: 15.9 mmol/L — AB (ref 20.0–28.0)
Bicarbonate: 18.7 mmol/L — ABNORMAL LOW (ref 20.0–28.0)
DRAWN BY: 235321
DRAWN BY: 103701
O2 CONTENT: 4 L/min
O2 Content: 3 L/min
O2 SAT: 95.2 %
O2 Saturation: 92.2 %
PATIENT TEMPERATURE: 98.6
PATIENT TEMPERATURE: 100.6
PO2 ART: 114 mmHg — AB (ref 83.0–108.0)
pCO2 arterial: 38.3 mmHg (ref 32.0–48.0)
pCO2 arterial: 47.4 mmHg (ref 32.0–48.0)
pH, Arterial: 7.31 — ABNORMAL LOW (ref 7.350–7.450)
pH, Arterial: 7.161 — CL (ref 7.350–7.450)
pO2, Arterial: 77.5 mmHg — ABNORMAL LOW (ref 83.0–108.0)

## 2017-10-18 LAB — ECHOCARDIOGRAM COMPLETE

## 2017-10-18 LAB — CREATININE, SERUM
Creatinine, Ser: 1.41 mg/dL — ABNORMAL HIGH (ref 0.44–1.00)
GFR calc Af Amer: 41 mL/min — ABNORMAL LOW (ref >60)
GFR calc non Af Amer: 36 mL/min — ABNORMAL LOW (ref >60)

## 2017-10-18 LAB — AMMONIA
Ammonia: 47 umol/L — ABNORMAL HIGH (ref 9–35)
Ammonia: 56 umol/L — ABNORMAL HIGH (ref 9–35)

## 2017-10-18 LAB — CBC
HCT: 37.8 % (ref 36.0–46.0)
Hemoglobin: 11.9 g/dL — ABNORMAL LOW (ref 12.0–15.0)
MCH: 31.9 pg (ref 26.0–34.0)
MCHC: 31.5 g/dL (ref 30.0–36.0)
MCV: 101.3 fL — ABNORMAL HIGH (ref 78.0–100.0)
Platelets: 126 10*3/uL — ABNORMAL LOW (ref 150–400)
RBC: 3.73 MIL/uL — ABNORMAL LOW (ref 3.87–5.11)
RDW: 15.4 % (ref 11.5–15.5)
WBC: 24.4 10*3/uL — ABNORMAL HIGH (ref 4.0–10.5)

## 2017-10-18 LAB — I-STAT CG4 LACTIC ACID, ED
Lactic Acid, Venous: 3.75 mmol/L (ref 0.5–1.9)
Lactic Acid, Venous: 5.73 mmol/L (ref 0.5–1.9)

## 2017-10-18 LAB — MRSA PCR SCREENING: MRSA by PCR: NEGATIVE

## 2017-10-18 MED ORDER — METHYLPREDNISOLONE SODIUM SUCC 125 MG IJ SOLR
60.0000 mg | Freq: Four times a day (QID) | INTRAMUSCULAR | Status: DC
  Administered 2017-10-18 – 2017-10-19 (×5): 60 mg via INTRAVENOUS
  Filled 2017-10-18 (×5): qty 2

## 2017-10-18 MED ORDER — SODIUM CHLORIDE 0.9 % IV SOLN
2.0000 g | Freq: Once | INTRAVENOUS | Status: AC
  Administered 2017-10-18: 2 g via INTRAVENOUS
  Filled 2017-10-18: qty 2

## 2017-10-18 MED ORDER — LACTULOSE 10 GM/15ML PO SOLN
30.0000 g | Freq: Four times a day (QID) | ORAL | Status: DC
  Administered 2017-10-18 – 2017-10-22 (×15): 30 g via ORAL
  Filled 2017-10-18 (×15): qty 45

## 2017-10-18 MED ORDER — ALBUTEROL SULFATE (2.5 MG/3ML) 0.083% IN NEBU: 2.5000 mg | INHALATION_SOLUTION | RESPIRATORY_TRACT | Status: DC | PRN

## 2017-10-18 MED ORDER — PANTOPRAZOLE SODIUM 40 MG IV SOLR
40.0000 mg | INTRAVENOUS | Status: DC
  Administered 2017-10-18 – 2017-10-19 (×2): 40 mg via INTRAVENOUS
  Filled 2017-10-18 (×2): qty 40

## 2017-10-18 MED ORDER — SODIUM CHLORIDE 0.9 % IV BOLUS (SEPSIS): 500.0000 mL | Freq: Once | INTRAVENOUS | Status: DC

## 2017-10-18 MED ORDER — SODIUM CHLORIDE 0.9% FLUSH: 3.0000 mL | INTRAVENOUS | Status: DC | PRN

## 2017-10-18 MED ORDER — SODIUM CHLORIDE 0.9% FLUSH
3.0000 mL | Freq: Two times a day (BID) | INTRAVENOUS | Status: DC
  Administered 2017-10-18: 3 mL via INTRAVENOUS

## 2017-10-18 MED ORDER — SODIUM CHLORIDE 0.9 % IV SOLN: 250.0000 mL | INTRAVENOUS | Status: DC | PRN

## 2017-10-18 MED ORDER — IPRATROPIUM BROMIDE 0.02 % IN SOLN
0.5000 mg | Freq: Once | RESPIRATORY_TRACT | Status: AC
  Administered 2017-10-18: 0.5 mg via RESPIRATORY_TRACT
  Filled 2017-10-18: qty 2.5

## 2017-10-18 MED ORDER — MAGNESIUM SULFATE 2 GM/50ML IV SOLN
2.0000 g | Freq: Once | INTRAVENOUS | Status: AC
  Administered 2017-10-18: 2 g via INTRAVENOUS
  Filled 2017-10-18: qty 50

## 2017-10-18 MED ORDER — SODIUM CHLORIDE 0.9 % IV BOLUS (SEPSIS)
500.0000 mL | Freq: Once | INTRAVENOUS | Status: AC
  Administered 2017-10-18: 500 mL via INTRAVENOUS

## 2017-10-18 MED ORDER — OSELTAMIVIR PHOSPHATE 30 MG PO CAPS
30.0000 mg | ORAL_CAPSULE | Freq: Two times a day (BID) | ORAL | Status: DC
  Administered 2017-10-18 – 2017-10-19 (×2): 30 mg via ORAL
  Filled 2017-10-18 (×4): qty 1

## 2017-10-18 MED ORDER — SODIUM CHLORIDE 0.9% FLUSH
3.0000 mL | Freq: Two times a day (BID) | INTRAVENOUS | Status: DC
  Administered 2017-10-18 – 2017-10-21 (×4): 3 mL via INTRAVENOUS

## 2017-10-18 MED ORDER — ACETAMINOPHEN 500 MG PO TABS
1000.0000 mg | ORAL_TABLET | Freq: Once | ORAL | Status: AC
  Administered 2017-10-18: 1000 mg via ORAL
  Filled 2017-10-18: qty 2

## 2017-10-18 MED ORDER — ALBUTEROL (5 MG/ML) CONTINUOUS INHALATION SOLN
10.0000 mg/h | INHALATION_SOLUTION | RESPIRATORY_TRACT | Status: AC
  Administered 2017-10-18: 10 mg/h via RESPIRATORY_TRACT
  Filled 2017-10-18: qty 20

## 2017-10-18 MED ORDER — RIFAXIMIN 550 MG PO TABS
550.0000 mg | ORAL_TABLET | Freq: Two times a day (BID) | ORAL | Status: DC
  Administered 2017-10-18 – 2017-10-22 (×8): 550 mg via ORAL
  Filled 2017-10-18 (×10): qty 1

## 2017-10-18 MED ORDER — INSULIN ASPART 100 UNIT/ML ~~LOC~~ SOLN
0.0000 [IU] | SUBCUTANEOUS | Status: DC
  Administered 2017-10-18: 3 [IU] via SUBCUTANEOUS
  Administered 2017-10-18: 5 [IU] via SUBCUTANEOUS
  Administered 2017-10-19: 2 [IU] via SUBCUTANEOUS
  Administered 2017-10-19: 5 [IU] via SUBCUTANEOUS
  Administered 2017-10-19: 3 [IU] via SUBCUTANEOUS
  Administered 2017-10-19: 2 [IU] via SUBCUTANEOUS
  Administered 2017-10-19 (×2): 3 [IU] via SUBCUTANEOUS
  Administered 2017-10-20: 7 [IU] via SUBCUTANEOUS
  Administered 2017-10-20: 9 [IU] via SUBCUTANEOUS
  Administered 2017-10-20: 7 [IU] via SUBCUTANEOUS
  Administered 2017-10-20: 3 [IU] via SUBCUTANEOUS
  Administered 2017-10-20 (×2): 5 [IU] via SUBCUTANEOUS
  Administered 2017-10-21: 3 [IU] via SUBCUTANEOUS
  Administered 2017-10-21: 2 [IU] via SUBCUTANEOUS
  Administered 2017-10-21: 7 [IU] via SUBCUTANEOUS
  Administered 2017-10-21: 9 [IU] via SUBCUTANEOUS
  Administered 2017-10-21 – 2017-10-22 (×3): 5 [IU] via SUBCUTANEOUS
  Administered 2017-10-22: 3 [IU] via SUBCUTANEOUS
  Administered 2017-10-22 (×3): 2 [IU] via SUBCUTANEOUS

## 2017-10-18 MED ORDER — ENOXAPARIN SODIUM 40 MG/0.4ML ~~LOC~~ SOLN
40.0000 mg | SUBCUTANEOUS | Status: DC
  Administered 2017-10-18 – 2017-10-21 (×4): 40 mg via SUBCUTANEOUS
  Filled 2017-10-18 (×5): qty 0.4

## 2017-10-18 MED ORDER — VANCOMYCIN HCL IN DEXTROSE 1-5 GM/200ML-% IV SOLN: 1000.0000 mg | INTRAVENOUS | Status: DC

## 2017-10-18 MED ORDER — CEFEPIME HCL 1 G IJ SOLR
1.0000 g | Freq: Three times a day (TID) | INTRAMUSCULAR | Status: DC
  Administered 2017-10-18: 1 g via INTRAVENOUS
  Filled 2017-10-18 (×3): qty 1

## 2017-10-18 MED ORDER — IPRATROPIUM-ALBUTEROL 0.5-2.5 (3) MG/3ML IN SOLN
3.0000 mL | Freq: Four times a day (QID) | RESPIRATORY_TRACT | Status: DC
  Administered 2017-10-18 – 2017-10-19 (×5): 3 mL via RESPIRATORY_TRACT
  Filled 2017-10-18 (×6): qty 3

## 2017-10-18 MED ORDER — VANCOMYCIN HCL 10 G IV SOLR
1500.0000 mg | Freq: Once | INTRAVENOUS | Status: AC
  Administered 2017-10-18: 1500 mg via INTRAVENOUS
  Filled 2017-10-18: qty 1500

## 2017-10-18 MED ORDER — SODIUM CHLORIDE 0.9 % IV SOLN
2.0000 g | INTRAVENOUS | Status: DC
  Administered 2017-10-19 – 2017-10-21 (×3): 2 g via INTRAVENOUS
  Filled 2017-10-18 (×3): qty 2

## 2017-10-18 MED ORDER — LACTULOSE 10 GM/15ML PO SOLN: 20.0000 g | Freq: Every day | ORAL | Status: DC

## 2017-10-18 MED ORDER — IPRATROPIUM-ALBUTEROL 0.5-2.5 (3) MG/3ML IN SOLN: 3.0000 mL | Freq: Four times a day (QID) | RESPIRATORY_TRACT | Status: DC

## 2017-10-18 MED ORDER — SODIUM CHLORIDE 0.9 % IV BOLUS (SEPSIS)
1000.0000 mL | Freq: Once | INTRAVENOUS | Status: AC
  Administered 2017-10-18: 1000 mL via INTRAVENOUS

## 2017-10-18 MED ORDER — LORAZEPAM 2 MG/ML IJ SOLN
0.5000 mg | Freq: Once | INTRAMUSCULAR | Status: AC
  Administered 2017-10-18: 0.5 mg via INTRAVENOUS
  Filled 2017-10-18: qty 1

## 2017-10-18 MED ORDER — OSELTAMIVIR PHOSPHATE 75 MG PO CAPS
75.0000 mg | ORAL_CAPSULE | Freq: Two times a day (BID) | ORAL | Status: DC
  Filled 2017-10-18 (×2): qty 1

## 2017-10-18 MED ORDER — VANCOMYCIN HCL IN DEXTROSE 750-5 MG/150ML-% IV SOLN
750.0000 mg | INTRAVENOUS | Status: DC
  Administered 2017-10-19 – 2017-10-20 (×2): 750 mg via INTRAVENOUS
  Filled 2017-10-18 (×2): qty 150

## 2017-10-18 MED ORDER — INSULIN ASPART 100 UNIT/ML ~~LOC~~ SOLN: 1.0000 [IU] | SUBCUTANEOUS | Status: DC

## 2017-10-18 MED ORDER — INSULIN ASPART 100 UNIT/ML ~~LOC~~ SOLN: 0.0000 [IU] | SUBCUTANEOUS | Status: DC

## 2017-10-18 MED ORDER — VANCOMYCIN HCL IN DEXTROSE 1-5 GM/200ML-% IV SOLN: 1000.0000 mg | Freq: Once | INTRAVENOUS | Status: DC

## 2017-10-18 NOTE — Progress Notes (Signed)
Pharmacy Antibiotic Note  Ashley Savage is a 77 y.o. female admitted on 10/18/2017 with sepsis.  Pharmacy has been consulted for vancomycin and cefepime dosing.  Today, 10/18/2017 Day #1 antibiotics Tmax 100.6 WBC 24.5 SCr 0.93, CrCl ~55 ml/min Lactate 5.73  Plan: Cefepime 2g given in ED, continue with 1g IV q8h Vancomycin 1500mg  IV given in ED, continue with 1g IV q24h for estimated AUC 500 Check vancomycin levels at steady state, goal AUC 400-500 Follow up renal function & cultures     Temp (24hrs), Avg:99.6 F (37.6 C), Min:98.6 F (37 C), Max:100.6 F (38.1 C)  Recent Labs  Lab 10/15/17 10/18/17 0259 10/18/17 0357 10/18/17 0513  WBC  --  24.5*  --   --   CREATININE 0.89 0.93  --   --   LATICACIDVEN  --   --  3.75* 5.73*    Estimated Creatinine Clearance: 56.6 mL/min (by C-G formula based on SCr of 0.93 mg/dL).    Allergies  Allergen Reactions  . Citalopram Palpitations    Irregular heart beat Irregular heart beat  . Ciprofloxacin Other (See Comments)    Mental status change Mental status change  . Erythromycin Other (See Comments)    Stomach cramps Stomach cramps  . Glimepiride Other (See Comments)    Elevated ammonia levels Elevated ammonia levels  . Prednisone     Increased blood sugars too high    Antimicrobials this admission: 3/25 cefepime >>  3/25 vancomycin >>  3/25 tamiflu >>  Dose adjustments this admission:   Microbiology results: 3/25 BCx: 3/25 Respiratory panel: 3/25 MRSA PCR:  Thank you for allowing pharmacy to be a part of this patient's care.  Peggyann Juba, PharmD, BCPS Pager: [phone] 10/18/2017 1:07 PM

## 2017-10-18 NOTE — ED Notes (Signed)
Bed: WA11 Expected date:  Expected time:  Means of arrival:  Comments: ems 

## 2017-10-18 NOTE — ED Triage Notes (Signed)
Normally on 2L oxygen [place] . Sats dropped into the 80's and facility was unable to get them to increase. Patient remained in distress and was brought to the hospital. Wheezing was reported in all fields with tachypnea.

## 2017-10-18 NOTE — ED Notes (Signed)
Respiratory at bedside.

## 2017-10-18 NOTE — Progress Notes (Signed)
  Echocardiogram 2D Echocardiogram has been performed.  Ashley Savage 10/18/2017, 2:07 PM

## 2017-10-18 NOTE — ED Provider Notes (Signed)
[facility] DEPT Provider Note   CSN: [id number] Arrival date & time: 10/18/17  0234     History   Chief Complaint Chief Complaint  Patient presents with  . Respiratory Distress    HPI Ashley Savage is a 77 y.o. female with a hx of AAA, COPD, insulin-dependent diabetes, breast cancer (no active disease), anxiety, arthritis, liver cirrhosis, chronic respiratory failure presents to the Emergency Department complaining of gradual, persistent, progressively worsening shortness of breath and wheezing throughout the entire day.  She reports that she woke tonight with significant difficulty breathing.  Per EMS, patient was found to be hypoxic at the facility where she lives and they were unable to increase her oxygen saturations.  She is on 2 L via nasal cannula at home.  Patient reports she has had several intermittent breathing treatments throughout the day but nothing has helped significantly.  EMS gave albuterol 5 mg and Solu-Medrol 125 mg.  Patient reports she has never been intubated for this but has come very close.  She denies known fevers at home however does report that she has had a worsening productive cough for the last 3 days.  Any type of exertion makes her shortness of breath worse.  She denies vomiting, diarrhea, known sick contacts.  The history is provided by the patient and medical records. No language interpreter was used.    Past Medical History:  Diagnosis Date  . Abdominal aortic aneurysm ([place]) 10/01/2013   Fall of 2014 3.3 per patient, follows with Vascular surgeon.   . Acute respiratory failure with hypoxia ([place]) 07/31/2015  . Allergic state 11/10/2016  . Anxiety   . Anxiety and depression 02/01/2014  . Arthritis of both knees 10/01/2013  . Arthritis of right knee 10/01/2013   Follows with Dr Mayer Camel   . Benign paroxysmal positional vertigo 10/01/2013  . Breast cancer ([place])    No disease activity On Femara Follows with Dr Marin Olp Right mastectomy  performed by Dr Autumn Messing   . Cancer [facility]) breast ca  right  . Chronic respiratory failure ([place]) 08/29/2015  . Cirrhosis of liver without ascites ([place]) 07/31/2015  . COPD (chronic obstructive pulmonary disease) ([place]) 10/01/2013  . COPD with acute exacerbation ([place]) 05/05/2017  . Depression   . Dermatitis 03/30/2017  . Diabetes mellitus type 2  . Diabetes mellitus type 2, controlled ([place]) 10/16/2014  . Emphysema   . Encephalopathy, hepatic ([place]) 06/07/2014  . Esophageal reflux 10/01/2013  . Fall 07/30/2016  . Hyperlipidemia   . Hyperlipidemia, mixed   . Increased ammonia level 11/25/2014  . NASH (nonalcoholic steatohepatitis) 08/26/2015  . Neck pain 10/01/2013  . Neuropathy    feet   . Osteopenia 03/30/2017  . Overactive bladder 12/10/2013  . Panic attacks   . Pedal edema 12/10/2013  . Personal history of radiation therapy   . Preventative health care 03/08/2016  . Thrombocytopenia ([place]) 03/17/2012  . Tobacco abuse disorder 02/01/2014  . Type 2 diabetes mellitus with hyperglycemia, with long-term current use of insulin ([place])   . Urine frequency 04/13/2017    Patient Active Problem List   Diagnosis Date Noted  . Leg edema, left 10/02/2017  . Arthritis 10/02/2017  . COPD exacerbation ([place]) 06/25/2017  . Ventricular tachycardia ([place]) 05/22/2017  . Acute kidney injury ([place]) 05/17/2017  . Hyperkalemia 05/17/2017  . Vitamin D deficiency 05/17/2017  . COPD with acute exacerbation ([place]) 05/05/2017  . Urine frequency 04/13/2017  . Osteopenia 03/30/2017  . Dermatitis 03/30/2017  . Allergic  state 11/10/2016  . 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 Portal hypertensive gastropathy ([place])   . Lower back injury, initial encounter 08/05/2016  . Fall 07/30/2016  . Preventative health care 03/08/2016  . Muscle spasm 02/25/2016  . Chronic respiratory failure ([place]) 08/29/2015  . NASH (nonalcoholic steatohepatitis) 08/26/2015  . Type 2 diabetes mellitus with hyperglycemia, with long-term current use of  insulin ([place])   . Cirrhosis of liver without ascites ([place]) 07/31/2015  . Acute respiratory failure with hypoxia ([place]) 07/31/2015  . Diarrhea 12/30/2014  . Increased ammonia level 11/25/2014  . Diabetes mellitus type 2, controlled ([place]) 10/16/2014  . Encephalopathy, hepatic ([place]) 06/07/2014  . Right knee pain 06/07/2014  . Sun-damaged skin 02/01/2014  . Anxiety and depression 02/01/2014  . Tobacco abuse 02/01/2014  . Medicare annual wellness visit, subsequent 02/01/2014  . Pedal edema 12/10/2013  . Overactive bladder 12/10/2013  . Abdominal aortic aneurysm ([place]) 10/01/2013  . Arthritis of right knee 10/01/2013  . Benign paroxysmal positional vertigo 10/01/2013  . Neck pain 10/01/2013  . Esophageal reflux 10/01/2013  . Thrombocytopenia ([place]) 03/17/2012  . 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   . Hyperlipidemia, mixed   . Breast cancer [facility])     Past Surgical History:  Procedure Laterality Date  . APPENDECTOMY  2007  . BREAST SURGERY  2009 right  . CATARACT EXTRACTION     x 2  . ESOPHAGOGASTRODUODENOSCOPY (EGD) WITH PROPOFOL N/A 08/14/2016   Procedure: ESOPHAGOGASTRODUODENOSCOPY (EGD) WITH PROPOFOL;  Surgeon: Mauri Pole, MD;  Location: [facility] ENDOSCOPY;  Service: Endoscopy;  Laterality: N/A;  . [place]  . KNEE SURGERY    . MANDIBLE FRACTURE SURGERY    . MASTECTOMY    . PILONIDAL CYST EXCISION    . TONSILLECTOMY       OB History   None      Home Medications    Prior to Admission medications   Medication Sig Start Date End Date Taking? Authorizing Provider  acetaminophen (TYLENOL) 325 MG tablet Take 650 mg by mouth every 6 (six) hours as needed for mild pain or moderate pain.   Yes [provider]  albuterol (ACCUNEB) 0.63 MG/3ML nebulizer solution Take 1 ampule by nebulization every 6 (six) hours as needed for shortness of breath.   Yes [provider]  bisacodyl (DULCOLAX) 10 MG suppository  Place 10 mg rectally daily as needed for moderate constipation.   Yes [provider]  fluticasone (FLONASE ALLERGY RELIEF) 50 MCG/ACT nasal spray Place 1 spray into both nostrils daily.   Yes [provider]  furosemide (LASIX) 80 MG tablet Take 80 mg by mouth daily.   Yes [provider]  insulin aspart (NOVOLOG) 100 UNIT/ML FlexPen Inject 0-15 Units into the skin 3 (three) times daily with meals. CBG ac & hs with SSI 70 -120= 0 units ,121-150=2units,151-200=3units,201-250=5units,251-300=8 units,301-350=11units,351-400=15units greater than 400 call MD and give 15units   Yes [provider]  insulin detemir (LEVEMIR) 100 UNIT/ML injection Inject 8 Units into the skin 2 (two) times daily.   Yes [provider]  lactulose (CHRONULAC) 10 GM/15ML solution Take 60 g by mouth every 6 (six) hours.    Yes [provider]  LORazepam (ATIVAN) 0.5 MG tablet Take 0.5 mg by mouth every 6 (six) hours as needed for anxiety.   Yes [provider]  magnesium hydroxide (MILK OF MAGNESIA) 400 MG/5ML suspension Take 30 mLs by mouth daily as needed for mild constipation.  Yes [provider]  magnesium oxide (MAG-OX) 400 MG tablet Take 400 mg by mouth 2 (two) times daily.   Yes [provider]  Menthol, Topical Analgesic, (BIOFREEZE) 4 % GEL Apply 1 application topically 2 (two) times daily. Both knees   Yes [provider]  mometasone-formoterol (DULERA) 100-5 MCG/ACT AERO Inhale 2 puffs into the lungs 2 (two) times daily.   Yes [provider]  pantoprazole (PROTONIX) 20 MG tablet Take 20 mg by mouth daily.   Yes [provider]  potassium chloride (K-DUR) 10 MEQ tablet Take 10 mEq by mouth daily.   Yes [provider]  rifaximin (XIFAXAN) 550 MG TABS tablet Take 550 mg by mouth 2 (two) times daily.   Yes [provider]  Sodium Phosphates (RA SALINE ENEMA) 19-7 GM/118ML ENEM Place 1 application  rectally daily as needed. Constipation   Yes [provider]  spironolactone (ALDACTONE) 25 MG tablet Take 25 mg by mouth daily.   Yes [provider]  torsemide (DEMADEX) 20 MG tablet Take 20 mg by mouth daily.   Yes [provider]  celecoxib (CELEBREX) 100 MG capsule Take 100 mg by mouth every 8 (eight) hours as needed for mild pain.    [provider]  hydrocortisone cream 1 % Apply 1 application topically daily as needed for itching. Apply rectally as needed    [provider]  polyvinyl alcohol (LUBRICANT DROPS) 1.4 % ophthalmic solution Place 1 drop into both eyes as needed (dry eyes). 05/11/17   Hosie Poisson, MD  sodium chloride (OCEAN) 0.65 % SOLN nasal spray Place 2 sprays into both nostrils 2 (two) times daily as needed for congestion.    [provider]    Family History Family History  Problem Relation Age of Onset  . Heart failure Father   . COPD Father   . Arthritis Father 24  . Stroke Mother   . Arthritis Mother 18  . Hyperlipidemia Mother   . Hypertension Mother   . Diabetes Mother   . Diabetes Sister   . Breast cancer Unknown   . Breast cancer Maternal Aunt   . Asthma Maternal Aunt   . Birth defects Maternal Aunt   . Alcohol abuse Maternal Uncle   . Breast cancer Maternal Aunt   . Stomach cancer Neg Hx   . Colon cancer Neg Hx     Social History Social History   Tobacco Use  . Smoking status: Former Smoker    Packs/day: 0.25    Years: 58.00    Pack years: 14.50    Types: Cigarettes    Start date: 07/27/1964  . Smokeless tobacco: Never Used  Substance Use Topics  . Alcohol use: No    Alcohol/week: 0.0 oz  . Drug use: No     Allergies   Citalopram; Ciprofloxacin; Erythromycin; Glimepiride; and Prednisone   Review of Systems Review of Systems  Constitutional: Negative for appetite change, diaphoresis, fatigue, fever and unexpected weight change.  HENT: Negative for mouth sores.   Eyes: Negative  for visual disturbance.  Respiratory: Positive for cough, chest tightness, shortness of breath and wheezing.   Cardiovascular: Negative for chest pain.  Gastrointestinal: Negative for abdominal pain, constipation, diarrhea, nausea and vomiting.  Endocrine: Negative for polydipsia, polyphagia and polyuria.  Genitourinary: Negative for dysuria, frequency, hematuria and urgency.  Musculoskeletal: Negative for back pain and neck stiffness.  Skin: Negative for rash.  Allergic/Immunologic: Negative for immunocompromised state.  Neurological: Negative for syncope, light-headedness and  headaches.  Hematological: Does not bruise/bleed easily.  Psychiatric/Behavioral: Negative for sleep disturbance. The patient is not nervous/anxious.      Physical Exam Updated Vital Signs BP (!) 144/48 (BP Location: Right Arm)   Pulse (!) 115   Temp 98.6 F (37 C) (Oral)   Resp (!) 27   LMP  (LMP Unknown)   SpO2 100%   Physical Exam  Constitutional: She appears well-developed and well-nourished. She appears distressed.  Awake, alert, nontoxic appearance  HENT:  Head: Normocephalic and atraumatic.  Mouth/Throat: Oropharynx is clear and moist. No oropharyngeal exudate.  Eyes: Conjunctivae are normal. No scleral icterus.  Neck: Normal range of motion. Neck supple.  Cardiovascular: Regular rhythm and intact distal pulses. Tachycardia present.  Pulses:      Radial pulses are 2+ on the right side, and 2+ on the left side.  Pulmonary/Chest: Accessory muscle usage present. Tachypnea noted. She is in respiratory distress. She has decreased breath sounds. She has wheezes ( Respiratory end expiratory throughout). She has rhonchi.  Equal chest expansion  Abdominal: Soft. Bowel sounds are normal. She exhibits no mass. There is no tenderness. There is no rebound and no guarding.  Musculoskeletal: Normal range of motion. She exhibits edema ( 2+ pitting edema in the bilateral lower extremities).  Neurological: She is  alert.  Speech is clear and goal oriented Moves extremities without ataxia  Skin: Skin is warm and dry. She is not diaphoretic.  Psychiatric: She has a normal mood and affect.  Nursing note and vitals reviewed.    ED Treatments / Results  Labs (all labs ordered are listed, but only abnormal results are displayed) Labs Reviewed  CBC WITH DIFFERENTIAL/PLATELET - Abnormal; Notable for the following components:      Result Value   WBC 24.5 (*)    Platelets 131 (*)    Neutro Abs 21.0 (*)    Monocytes Absolute 2.0 (*)    All other components within normal limits  COMPREHENSIVE METABOLIC PANEL - Abnormal; Notable for the following components:   Chloride 100 (*)    Glucose, Bld 152 (*)    BUN 23 (*)    Total Protein 5.7 (*)    Albumin 2.3 (*)    Alkaline Phosphatase 187 (*)    Total Bilirubin 3.8 (*)    GFR calc non Af Amer 59 (*)    All other components within normal limits  I-STAT CG4 LACTIC ACID, ED - Abnormal; Notable for the following components:   Lactic Acid, Venous 3.75 (*)    All other components within normal limits  I-STAT CG4 LACTIC ACID, ED - Abnormal; Notable for the following components:   Lactic Acid, Venous 5.73 (*)    All other components within normal limits  CULTURE, BLOOD (ROUTINE X 2)  CULTURE, BLOOD (ROUTINE X 2)  URINALYSIS, ROUTINE W REFLEX MICROSCOPIC  I-STAT CG4 LACTIC ACID, ED    EKG EKG Interpretation  Date/Time:  Monday October 18 2017 03:09:11 EDT Ventricular Rate:  114 PR Interval:    QRS Duration: 102 QT Interval:  350 QTC Calculation: 482 R Axis:   155 Text Interpretation:  Sinus tachycardia LAE, consider biatrial enlargement Right axis deviation When compared with ECG of 06/25/2017, No significant change was found Confirmed by Delora Fuel ([id number]) on 10/18/2017 3:27:11 AM   Radiology Dg Chest 2 View  Result Date: 10/18/2017 CLINICAL DATA:  77 year old female with shortness of breath, hypoxia, and wheezing. EXAM: CHEST - 2 VIEW  COMPARISON:  Chest radiograph dated 08/30/2017 FINDINGS:  Emphysema with chronic interstitial coarsening. No focal consolidation, pleural effusion, or pneumothorax. Mild cardiomegaly. Atherosclerotic calcification of the aortic arch. No acute osseous pathology. IMPRESSION: 1. No acute cardiopulmonary process. 2. Emphysema. Electronically Signed   By: Anner Crete M.D.   On: 10/18/2017 04:49    Procedures .Critical Care Performed by: Abigail Butts, PA-C Authorized by: Abigail Butts, PA-C   Critical care provider statement:    Critical care time (minutes):  60   Critical care time was exclusive of:  Separately billable procedures and treating other patients and teaching time   Critical care was necessary to treat or prevent imminent or life-threatening deterioration of the following conditions:  Respiratory failure   Critical care was time spent personally by me on the following activities:  Development of treatment plan with patient or surrogate, discussions with consultants, evaluation of patient's response to treatment, examination of patient, obtaining history from patient or surrogate, ordering and performing treatments and interventions, ordering and review of laboratory studies, ordering and review of radiographic studies, pulse oximetry, re-evaluation of patient's condition and review of old charts   I assumed direction of critical care for this patient from another provider in my specialty: no     (including critical care time)  Medications Ordered in ED Medications  albuterol (PROVENTIL,VENTOLIN) solution continuous neb (0 mg/hr Nebulization Stopped 10/18/17 0408)  vancomycin (VANCOCIN) 1,500 mg in sodium chloride 0.9 % 500 mL IVPB (1,500 mg Intravenous New Bag/Given 10/18/17 0450)  albuterol (PROVENTIL,VENTOLIN) solution continuous neb (has no administration in time range)  ipratropium (ATROVENT) nebulizer solution 0.5 mg (has no administration in time range)  sodium  chloride 0.9 % bolus 1,000 mL (has no administration in time range)    And  sodium chloride 0.9 % bolus 1,000 mL (has no administration in time range)    And  sodium chloride 0.9 % bolus 500 mL (has no administration in time range)  magnesium sulfate IVPB 2 g 50 mL (0 g Intravenous Stopped 10/18/17 0443)  ipratropium (ATROVENT) nebulizer solution 0.5 mg (0.5 mg Nebulization Given 10/18/17 0321)  acetaminophen (TYLENOL) tablet 1,000 mg (1,000 mg Oral Given 10/18/17 0452)  ceFEPIme (MAXIPIME) 2 g in sodium chloride 0.9 % 100 mL IVPB (2 g Intravenous New Bag/Given 10/18/17 0453)     Initial Impression / Assessment and Plan / ED Course  I have reviewed the triage vital signs and the nursing notes.  Pertinent labs & imaging results that were available during my care of the patient were reviewed by me and considered in my medical decision making (see chart for details).  Clinical Course as of Oct 18 525  Mon Oct 18, 2017  0255 Cough productive of thick green sputum on evaluation   [HM]  0335 Wheezing decreased.  Patient remains tachycardic and with tachypnea.   [HM]  [phone] Discussed with Dr. Alcario Drought who will admit   [HM]  0515 He needs to have significant work of breathing.  Will give additional breathing treatment.   [HM]  0516 Elevated but not above 4.  500 mL bolus given.  Antibiotics initiated.  Lactic Acid, Venous(!!): 3.75 [HM]  0517 Significant  WBC(!): 24.5 [HM]  0517 Emphysema noted.  I personally evaluated the images.  Patient clinically with pneumonia, will treat as same.  DG Chest 2 View [HM]  [id number] Elevation despite initial judicious fluid usage.  Will give 30 cc/kg bolus.  Lactic Acid, Venous(!!): 5.73 [HM]    Clinical Course User Index [HM] Gagan Dillion, Gwenlyn Perking    Patient  presents with COPD exacerbation.  She continues to have moderate respiratory distress despite Solu-Medrol and albuterol given by EMS.  Will give continuous nebulizer and magnesium.  We will begin  workup for possible pneumonia as patient's cough is newly productive.  03:30 AM Patient febrile, tachycardic and hypoxic.  Code sepsis initiated along with antibiotics.  Suspect her source is pneumonia.  She does live within a facility therefore will be treated for H CAP.  Initial lactic acid 3.75.  She does have significant peripheral edema therefore will give judicious dose of 500 mL's and reassess.  04:30 AM Patient continues to have respiratory distress although she is improved.  She does require supplemental oxygen to keep her oxygen saturations above 88%.  She does normally wear 2 L of oxygen at home.  No rales however she does continue with increased work of breathing.  She will need admission.  05:15  AM Repeat lactic acid is elevated to 5.73.  She will be given 30 mL/kg bolus.  Large leukocytosis noted.  Chest x-ray without significant consolidation however I do believe source is pneumonia.  Urinalysis pending.  Patient will need admission for further evaluation and treatment.  She continues to have some respiratory distress requiring 3 L of oxygen for oxygen saturation of 93%.  She has continued wheezing.  Will give additional albuterol.  The patient was discussed with and seen by Dr. Roxanne Mins who agrees with the treatment plan.   Final Clinical Impressions(s) / ED Diagnoses   Final diagnoses:  COPD exacerbation ([place])  Respiratory distress  HCAP (healthcare-associated pneumonia)  Sepsis, due to unspecified organism [facility])    ED Discharge Orders    None       Loni Muse Gwenlyn Perking 41/74/08 1448    Delora Fuel, MD 18/56/31 [phone]

## 2017-10-18 NOTE — Progress Notes (Signed)
Pt transported to ICU from ED on BIPAP.  

## 2017-10-18 NOTE — ED Notes (Signed)
Messaged Dr Bonner Puna via Shea Evans regarding critical pH 7.161 per Respiratory

## 2017-10-18 NOTE — ED Notes (Signed)
Call from Dr Bonner Puna- verbal order to put patient on bipap and he will come re-assess her in hour. Also consulting to critical care. Made Tammy with respiratory aware of bipap order.

## 2017-10-18 NOTE — ED Notes (Signed)
RN at bedside collecting labs 

## 2017-10-18 NOTE — H&P (Addendum)
PULMONARY / CRITICAL CARE MEDICINE   Name: Ashley Savage MRN: [id number] DOB: September 19, 1940    ADMISSION DATE:  10/18/2017 CONSULTATION DATE:  10/18/2017  REFERRING MD:  Nile Riggs [place]  CHIEF COMPLAINT:  AMS and respiratory failure  HISTORY OF PRESENT ILLNESS:   77 year old female with extensive PMH who presents to the ER with SOB.  Patient was noted to be profoundly acidotic and agitated.  Was given IV ativan and subsequently became obtunded and PCCM was consulted.  Patient is not groaning only to pain so unable to ascertain further history so all obtained from the chart.  Cough productive of yellow/green sputum, no fevers at home but febrile in the hospital.  WBC elevated and lactic acid is increasing.  BP is soft now that patient has severe acidosis.  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  Bili rising.  PAST MEDICAL HISTORY :  She  has a past medical history of Abdominal aortic aneurysm ([place]) (10/01/2013), Acute respiratory failure with hypoxia ([place]) (07/31/2015), Allergic state (11/10/2016), Anxiety, Anxiety and depression (02/01/2014), Arthritis of both knees (10/01/2013), Arthritis of right knee (10/01/2013), Benign paroxysmal positional vertigo (10/01/2013), Breast cancer ([place]), Cancer ([place]) (breast ca  right), Chronic respiratory failure ([place]) (08/29/2015), Cirrhosis of liver without ascites ([place]) (07/31/2015), COPD (chronic obstructive pulmonary disease) ([place]) (10/01/2013), COPD with acute exacerbation ([place]) (05/05/2017), Depression, Dermatitis (03/30/2017), Diabetes mellitus (type 2), Diabetes mellitus type 2, controlled ([place]) (10/16/2014), Emphysema, Encephalopathy, hepatic ([place]) (06/07/2014), Esophageal reflux (10/01/2013), Fall (07/30/2016), Hyperlipidemia, Hyperlipidemia, mixed, Increased ammonia level (11/25/2014), NASH (nonalcoholic steatohepatitis) (08/26/2015), Neck pain (10/01/2013), Neuropathy, Osteopenia (03/30/2017), Overactive bladder (12/10/2013), Panic attacks, Pedal edema (12/10/2013), Personal history of radiation  therapy, Preventative health care (03/08/2016), Thrombocytopenia ([place]) (03/17/2012), Tobacco abuse disorder (02/01/2014), Type 2 diabetes mellitus with hyperglycemia, with long-term current use of insulin ([place]), and Urine frequency (04/13/2017).  PAST SURGICAL HISTORY: She  has a past surgical history that includes Gallbladder surgery (1992); Breast surgery (2009 right); Appendectomy (2007); Knee surgery; Mandible fracture surgery; Pilonidal cyst excision; Tonsillectomy; Cataract extraction; Esophagogastroduodenoscopy (egd) with propofol (N/A, 08/14/2016); and Mastectomy.  Allergies  Allergen Reactions  . Citalopram Palpitations    Irregular heart beat Irregular heart beat  . Ciprofloxacin Other (See Comments)    Mental status change Mental status change  . Erythromycin Other (See Comments)    Stomach cramps Stomach cramps  . Glimepiride Other (See Comments)    Elevated ammonia levels Elevated ammonia levels  . Prednisone     Increased blood sugars too high    No current facility-administered medications on file prior to encounter.    Current Outpatient Medications on File Prior to Encounter  Medication Sig  . acetaminophen (TYLENOL) 325 MG tablet Take 650 mg by mouth every 6 (six) hours as needed for mild pain or moderate pain.  Marland Kitchen albuterol (ACCUNEB) 0.63 MG/3ML nebulizer solution Take 1 ampule by nebulization every 6 (six) hours as needed for shortness of breath.  . bisacodyl (DULCOLAX) 10 MG suppository Place 10 mg rectally daily as needed for moderate constipation.  . fluticasone (FLONASE ALLERGY RELIEF) 50 MCG/ACT nasal spray Place 1 spray into both nostrils daily.  . furosemide (LASIX) 80 MG tablet Take 80 mg by mouth daily.  . insulin aspart (NOVOLOG) 100 UNIT/ML FlexPen Inject 0-15 Units into the skin 3 (three) times daily with meals. CBG ac & hs with SSI 70 -120= 0 units ,121-150=2units,151-200=3units,201-250=5units,251-300=8 units,301-350=11units,351-400=15units greater than 400  call MD and give 15units  . insulin detemir (LEVEMIR) 100 UNIT/ML injection Inject 8 Units into  the skin 2 (two) times daily.  Marland Kitchen lactulose (CHRONULAC) 10 GM/15ML solution Take 60 g by mouth every 6 (six) hours.   Marland Kitchen LORazepam (ATIVAN) 0.5 MG tablet Take 0.5 mg by mouth every 6 (six) hours as needed for anxiety.  . magnesium hydroxide (MILK OF MAGNESIA) 400 MG/5ML suspension Take 30 mLs by mouth daily as needed for mild constipation.  . magnesium oxide (MAG-OX) 400 MG tablet Take 400 mg by mouth 2 (two) times daily.  . Menthol, Topical Analgesic, (BIOFREEZE) 4 % GEL Apply 1 application topically 2 (two) times daily. Both knees  . mometasone-formoterol (DULERA) 100-5 MCG/ACT AERO Inhale 2 puffs into the lungs 2 (two) times daily.  . pantoprazole (PROTONIX) 20 MG tablet Take 20 mg by mouth daily.  . potassium chloride (K-DUR) 10 MEQ tablet Take 10 mEq by mouth daily.  . rifaximin (XIFAXAN) 550 MG TABS tablet Take 550 mg by mouth 2 (two) times daily.  . Sodium Phosphates (RA SALINE ENEMA) 19-7 GM/118ML ENEM Place 1 application rectally daily as needed. Constipation  . spironolactone (ALDACTONE) 25 MG tablet Take 25 mg by mouth daily.  Marland Kitchen torsemide (DEMADEX) 20 MG tablet Take 20 mg by mouth daily.  . celecoxib (CELEBREX) 100 MG capsule Take 100 mg by mouth every 8 (eight) hours as needed for mild pain.  . hydrocortisone cream 1 % Apply 1 application topically daily as needed for itching. Apply rectally as needed  . polyvinyl alcohol (LUBRICANT DROPS) 1.4 % ophthalmic solution Place 1 drop into both eyes as needed (dry eyes).  . sodium chloride (OCEAN) 0.65 % SOLN nasal spray Place 2 sprays into both nostrils 2 (two) times daily as needed for congestion.    FAMILY HISTORY:  Her indicated that her mother is deceased. She indicated that her father is deceased. She indicated that her sister is alive. She indicated that her maternal grandmother is deceased. She indicated that her maternal grandfather is  deceased. She indicated that her paternal grandmother is deceased. She indicated that her paternal grandfather is deceased. She indicated that the status of her maternal uncle is unknown. She indicated that the status of her neg hx is unknown. She indicated that the status of her unknown relative is unknown.   SOCIAL HISTORY: She  reports that she has quit smoking. Her smoking use included cigarettes. She started smoking about 53 years ago. She has a 14.50 pack-year smoking history. She has never used smokeless tobacco. She reports that she does not drink alcohol or use drugs.  REVIEW OF SYSTEMS:   Unattainable, patient is very lethargic  SUBJECTIVE:  Unattainable  VITAL SIGNS: BP (!) 104/46   Pulse (!) 126   Temp (!) 100.6 F (38.1 C) (Rectal)   Resp 19   LMP  (LMP Unknown)   SpO2 97%   HEMODYNAMICS:    VENTILATOR SETTINGS:    INTAKE / OUTPUT: I/O last 3 completed shifts: In: 2150 [IV [id number]] Out: -   PHYSICAL EXAMINATION: General:  Chronically ill appearing female, NAD Neuro:  Lethargic, arousable, coughing, withdraws all ext to pain but not command, very supple neck. HEENT:  [place]/AT, PERRL, EOM-I and DMM Cardiovascular:  RRR, Nl S1/S2 and -M/R/G. Lungs:  Diffuse crackles. Abdomen:  Soft, NT, ND and +BS Musculoskeletal:  2+ edema bilaterally Skin:  Intact but thin  LABS:  BMET Recent Labs  Lab 10/15/17 10/18/17 0259  NA 140 140  K 4.8 4.0  CL  --  100*  CO2  --  27  BUN 14  23*  CREATININE 0.89 0.93  GLUCOSE  --  152*   Electrolytes Recent Labs  Lab 10/15/17 10/18/17 0259  CALCIUM 8.5 9.0   CBC Recent Labs  Lab 10/18/17 0259  WBC 24.5*  HGB 13.0  HCT 39.7  PLT 131*   Coag's No results for input(s): APTT, INR in the last 168 hours.  Sepsis Markers Recent Labs  Lab 10/18/17 0357 10/18/17 0513  LATICACIDVEN 3.75* 5.73*   ABG Recent Labs  Lab 10/18/17 0610 10/18/17 0853  PHART 7.310* 7.161*  PCO2ART 38.3 47.4  PO2ART 77.5*  114*   Liver Enzymes Recent Labs  Lab 10/15/17 10/18/17 0259  AST 28 40  ALT 19 22  ALKPHOS 191 187*  BILITOT 1.2 3.8*  ALBUMIN 2.2 2.3*   Cardiac Enzymes No results for input(s): TROPONINI, PROBNP in the last 168 hours.  Glucose No results for input(s): GLUCAP in the last 168 hours.  Imaging Dg Chest 2 View  Result Date: 10/18/2017 CLINICAL DATA:  77 year old female with shortness of breath, hypoxia, and wheezing. EXAM: CHEST - 2 VIEW COMPARISON:  Chest radiograph dated 08/30/2017 FINDINGS: Emphysema with chronic interstitial coarsening. No focal consolidation, pleural effusion, or pneumothorax. Mild cardiomegaly. Atherosclerotic calcification of the aortic arch. No acute osseous pathology. IMPRESSION: 1. No acute cardiopulmonary process. 2. Emphysema. Electronically Signed   By: Anner Crete M.D.   On: 10/18/2017 04:49   STUDIES:  Head CT 3/25>>>Nil acute CXR 3/25>>>Hyperinflation but no infiltrate noted that I looked at myself  CULTURES: Blood 3/25>>> Urine 3/25>>> Sputum 3/25>>>  ANTIBIOTICS: Cefepime 3/25>>> Vancomycin 3/25>>>  SIGNIFICANT EVENTS:  3/25>>> admission for AMS, metabolic acidosis and respiratory failure.  LINES/TUBES: ETT 3/25>>>  DISCUSSION: 77 year old female with PMH of cirrhosis amongst a myriad of other medical problems presenting with AMS, VDRF and lactic acidosis that is now altered and her airway protection is questionable at best.  ASSESSMENT / PLAN:  PULMONARY A: VDRF due to inability to protect airway and ?early sepsis P:   - Await arrival to the ICU then will likely intubate - Monitor closely for airway protection in the meantime - Titrate O2 for sat of 88-92% - Treat sepsis - May need placement of a TLC and increase BP to a point where we can insure perfusion - Duonebs - PRN albuterol - Solumedrol  CARDIOVASCULAR A:  Septic shock, source unclear P:  - IVF resuscitation gently given liver disease and sign off fluid  overload - May need to place TLC and start pressor pending response to intubation - If TLC then will check CVP - 2D echo  RENAL A:   Acute metabolic acidosis in the setting of severe liver disease P:   - Fluid resuscitation - F/U lactic acid level in AM - BMET in AM - Replace electrolytes as indicated  GASTROINTESTINAL A:   Cirrhosis unknown cause P:   - Lactulose as ordered by TRH - TF once intubated and OGT is in place  HEMATOLOGIC A:   Leukocytosis P:  - CBC in AM - Transfuse per ICU protocol  INFECTIOUS A:   Sepsis unknown source ?Flu P:   - RVP - Influenza testing - Vanc/cefepime - Pan cultures  ENDOCRINE A:   DM   P:   - ISS - CBG  NEUROLOGIC A:   AMS unknown cause, neck supple, doubt meningitis P:   RASS goal: 0 - Minimize sedation as able - No further benzos unless intubated - Ammonia level - Lactulose  FAMILY  - Updates: No  family bedside to update  - Inter-disciplinary family meet or Palliative Care meeting due by:  day 7  The patient is critically ill with multiple organ systems failure and requires high complexity decision making for assessment and support, frequent evaluation and titration of therapies, application of advanced monitoring technologies and extensive interpretation of multiple databases.   Critical Care Time devoted to patient care services described in this note is  45  Minutes. This time reflects time of care of this signee Dr Jennet Maduro. This critical care time does not reflect procedure time, or teaching time or supervisory time of PA/NP/Med student/Med Resident etc but could involve care discussion time.  Rush Farmer, M.D. [facility] Pulmonary/Critical Care Medicine. Pager: [phone]. After hours pager: [phone].  10/18/2017, 9:53 AM

## 2017-10-18 NOTE — Progress Notes (Signed)
A consult was received from an ED physician for cefepime and vancmycin per pharmacy dosing.  The patient's profile has been reviewed for ht/wt/allergies/indication/available labs.   A one time order has been placed for Cefepime 2 gm and Vancomycin 1500 mg.  Further antibiotics/pharmacy consults should be ordered by admitting physician if indicated.                       Thank you, Dorrene German 10/18/2017  4:21 AM

## 2017-10-18 NOTE — H&P (Addendum)
History and Physical   Ashley Savage:[id number] DOB: 1941/07/07 DOA: 10/18/2017  Referring MD/NP/PA: Roxanne Mins, MD, EDP PCP: Mosie Lukes, MD  Patient coming from: [facility]  Chief Complaint: Hypoxia  HPI: Ashley Savage is a 77 y.o. female with a history of 2L O2-dependent COPD, hepatic cirrhosis, DM, HLD who presented from her facility for hypoxia and respiratory distress with wheezing that began the previous day and worsened to severe overnight despite multiple breathing treatments. She had had chills and productive cough of yellow/green sputum for the past week. EMS was called, administered solumedrol.  ED Course: On arrival she was borderline febrile (100.85F rectal) in moderate respiratory distress with hypoxia, treated for HCAP, sepsis with initial lactic acid of 3.75 rising despite 500cc IV fluid to 5.73. Full 30cc/kg was given. ABG showed pH 7.310, pCO2 38, pO2 77. She grew restless with ventimask and was given ativan with subsequent drop in blood pressures. Hospitalists were called for admission. On my evaluation the patient is significantly somnolent limiting history significantly. Ammonia mildly elevated at 47 and repeat ABG shows respiratory acidosis with pH 7.161, pCO2 47, pO2 114.   Review of Systems: She denies any symptoms I ask her about, though I don't suspect this is reliable.   Past Medical History:  Diagnosis Date  . Abdominal aortic aneurysm ([place]) 10/01/2013   Fall of 2014 3.3 per patient, follows with Vascular surgeon.   . Acute respiratory failure with hypoxia ([place]) 07/31/2015  . Allergic state 11/10/2016  . Anxiety   . Anxiety and depression 02/01/2014  . Arthritis of both knees 10/01/2013  . Arthritis of right knee 10/01/2013   Follows with Dr Mayer Camel   . Benign paroxysmal positional vertigo 10/01/2013  . Breast cancer ([place])    No disease activity On Femara Follows with Dr Marin Olp Right mastectomy performed by Dr Autumn Messing   . Cancer [facility]) breast ca  right  . Chronic  respiratory failure ([place]) 08/29/2015  . Cirrhosis of liver without ascites ([place]) 07/31/2015  . COPD (chronic obstructive pulmonary disease) ([place]) 10/01/2013  . COPD with acute exacerbation ([place]) 05/05/2017  . Depression   . Dermatitis 03/30/2017  . Diabetes mellitus type 2  . Diabetes mellitus type 2, controlled ([place]) 10/16/2014  . Emphysema   . Encephalopathy, hepatic ([place]) 06/07/2014  . Esophageal reflux 10/01/2013  . Fall 07/30/2016  . Hyperlipidemia   . Hyperlipidemia, mixed   . Increased ammonia level 11/25/2014  . NASH (nonalcoholic steatohepatitis) 08/26/2015  . Neck pain 10/01/2013  . Neuropathy    feet   . Osteopenia 03/30/2017  . Overactive bladder 12/10/2013  . Panic attacks   . Pedal edema 12/10/2013  . Personal history of radiation therapy   . Preventative health care 03/08/2016  . Thrombocytopenia ([place]) 03/17/2012  . Tobacco abuse disorder 02/01/2014  . Type 2 diabetes mellitus with hyperglycemia, with long-term current use of insulin ([place])   . Urine frequency 04/13/2017   Past Surgical History:  Procedure Laterality Date  . APPENDECTOMY  2007  . BREAST SURGERY  2009 right  . CATARACT EXTRACTION     x 2  . ESOPHAGOGASTRODUODENOSCOPY (EGD) WITH PROPOFOL N/A 08/14/2016   Procedure: ESOPHAGOGASTRODUODENOSCOPY (EGD) WITH PROPOFOL;  Surgeon: Mauri Pole, MD;  Location: [facility];  Service: Endoscopy;  Laterality: N/A;  . [place]  . KNEE SURGERY    . MANDIBLE FRACTURE SURGERY    . MASTECTOMY    . PILONIDAL CYST EXCISION    . TONSILLECTOMY     -  Not currently smoking.   reports that she has quit smoking. Her smoking use included cigarettes. She started smoking about 53 years ago. She has a 14.50 pack-year smoking history. She has never used smokeless tobacco. She reports that she does not drink alcohol or use drugs. Allergies  Allergen Reactions  . Citalopram Palpitations    Irregular heart beat Irregular heart beat  . Ciprofloxacin Other (See Comments)     Mental status change Mental status change  . Erythromycin Other (See Comments)    Stomach cramps Stomach cramps  . Glimepiride Other (See Comments)    Elevated ammonia levels Elevated ammonia levels  . Prednisone     Increased blood sugars too high   Family History  Problem Relation Age of Onset  . Heart failure Father   . COPD Father   . Arthritis Father 72  . Stroke Mother   . Arthritis Mother 52  . Hyperlipidemia Mother   . Hypertension Mother   . Diabetes Mother   . Diabetes Sister   . Breast cancer Unknown   . Breast cancer Maternal Aunt   . Asthma Maternal Aunt   . Birth defects Maternal Aunt   . Alcohol abuse Maternal Uncle   . Breast cancer Maternal Aunt   . Stomach cancer Neg Hx   . Colon cancer Neg Hx    - Family history otherwise reviewed and not pertinent.  Prior to Admission medications   Medication Sig Start Date End Date Taking? Authorizing Provider  acetaminophen (TYLENOL) 325 MG tablet Take 650 mg by mouth every 6 (six) hours as needed for mild pain or moderate pain.   Yes [provider]  albuterol (ACCUNEB) 0.63 MG/3ML nebulizer solution Take 1 ampule by nebulization every 6 (six) hours as needed for shortness of breath.   Yes [provider]  bisacodyl (DULCOLAX) 10 MG suppository Place 10 mg rectally daily as needed for moderate constipation.   Yes [provider]  fluticasone (FLONASE ALLERGY RELIEF) 50 MCG/ACT nasal spray Place 1 spray into both nostrils daily.   Yes [provider]  furosemide (LASIX) 80 MG tablet Take 80 mg by mouth daily.   Yes [provider]  insulin aspart (NOVOLOG) 100 UNIT/ML FlexPen Inject 0-15 Units into the skin 3 (three) times daily with meals. CBG ac & hs with SSI 70 -120= 0 units ,121-150=2units,151-200=3units,201-250=5units,251-300=8 units,301-350=11units,351-400=15units greater than 400 call MD and give 15units   Yes [provider]  insulin detemir (LEVEMIR) 100  UNIT/ML injection Inject 8 Units into the skin 2 (two) times daily.   Yes [provider]  lactulose (CHRONULAC) 10 GM/15ML solution Take 60 g by mouth every 6 (six) hours.    Yes [provider]  LORazepam (ATIVAN) 0.5 MG tablet Take 0.5 mg by mouth every 6 (six) hours as needed for anxiety.   Yes [provider]  magnesium hydroxide (MILK OF MAGNESIA) 400 MG/5ML suspension Take 30 mLs by mouth daily as needed for mild constipation.   Yes [provider]  magnesium oxide (MAG-OX) 400 MG tablet Take 400 mg by mouth 2 (two) times daily.   Yes [provider]  Menthol, Topical Analgesic, (BIOFREEZE) 4 % GEL Apply 1 application topically 2 (two) times daily. Both knees   Yes [provider]  mometasone-formoterol (DULERA) 100-5 MCG/ACT AERO Inhale 2 puffs into the lungs 2 (two) times daily.   Yes [provider]  pantoprazole (PROTONIX) 20 MG tablet Take 20 mg by mouth daily.   Yes  [provider]  potassium chloride (K-DUR) 10 MEQ tablet Take 10 mEq by mouth daily.   Yes [provider]  rifaximin (XIFAXAN) 550 MG TABS tablet Take 550 mg by mouth 2 (two) times daily.   Yes [provider]  Sodium Phosphates (RA SALINE ENEMA) 19-7 GM/118ML ENEM Place 1 application rectally daily as needed. Constipation   Yes [provider]  spironolactone (ALDACTONE) 25 MG tablet Take 25 mg by mouth daily.   Yes [provider]  torsemide (DEMADEX) 20 MG tablet Take 20 mg by mouth daily.   Yes [provider]  celecoxib (CELEBREX) 100 MG capsule Take 100 mg by mouth every 8 (eight) hours as needed for mild pain.    [provider]  hydrocortisone cream 1 % Apply 1 application topically daily as needed for itching. Apply rectally as needed    [provider]  polyvinyl alcohol (LUBRICANT DROPS) 1.4 % ophthalmic solution Place 1 drop into both eyes as needed (dry eyes). 05/11/17   Hosie Poisson, MD  sodium chloride (OCEAN) 0.65 % SOLN nasal spray Place 2 sprays into both nostrils 2 (two) times daily as needed for congestion.    [provider]    Physical Exam: Vitals:   10/18/17 0800 10/18/17 0815 10/18/17 0830 10/18/17 0909  BP: (!) 114/53 (!) 103/56 (!) 99/38 (!) 104/46  Pulse: (!) 128 (!) 128 (!) 128 (!) 126  Resp: (!) 36 (!) 22 (!) 22 19  Temp:      TempSrc:      SpO2:    97%   Constitutional: Elderly, chornically ill-appearing female poorly responsive.  Eyes: Lids and conjunctivae normal, PERRL ENMT: Mucous membranes are moist. Posterior pharynx clear of any exudate or lesions. Poor dentition.  Neck: No rigidity, no masses, no thyromegaly Respiratory: Labored tachypnea with diminishe dbreath sounds globally and rhonchi to midlung fields. Cardiovascular: Regular tachycardia, no murmurs, rubs, or gallops. No carotid bruits. No JVD. 2+ pitting LE edema. Palpable pedal pulses. Abdomen: Normoactive bowel sounds. No tenderness, non-distended, and no masses palpated. No hepatosplenomegaly. GU: No indwelling catheter Musculoskeletal: No clubbing / cyanosis. No joint deformity upper and lower extremities. Skin: Warm, dry. No rashes, wounds, no ulcers.  Neurologic: Vocalizes to voice and touch but does not follow commands. No focal deficits in motor strength or sensation in all extremities on limited exam. Cannot appreciate asterixis. Psychiatric: Somnolent, not oriented.   Labs on Admission: I have personally reviewed following labs and imaging studies  CBC: Recent Labs  Lab 10/18/17 0259  WBC 24.5*  NEUTROABS 21.0*  HGB 13.0  HCT 39.7  MCV 97.1  PLT 528*   Basic Metabolic Panel: Recent Labs  Lab 10/15/17 10/18/17 0259  NA 140 140  K 4.8 4.0  CL  --  100*  CO2  --  27  GLUCOSE  --  152*  BUN 14 23*  CREATININE 0.89 0.93  CALCIUM 8.5 9.0   GFR: Estimated Creatinine Clearance: 56.6 mL/min (by C-G formula based on SCr of 0.93 mg/dL). Liver  Function Tests: Recent Labs  Lab 10/15/17 10/18/17 0259  AST 28 40  ALT 19 22  ALKPHOS 191 187*  BILITOT 1.2 3.8*  PROT 5.1 5.7*  ALBUMIN 2.2 2.3*   No results for input(s): LIPASE, AMYLASE in the last 168 hours. Recent Labs  Lab 10/18/17 0627  AMMONIA 47*   Coagulation Profile: No results for input(s): INR, PROTIME in the last 168 hours. Cardiac Enzymes: No results for input(s): CKTOTAL, CKMB,  CKMBINDEX, TROPONINI in the last 168 hours. BNP (last 3 results) Recent Labs    05/26/17 1438 07/14/17 1208  PROBNP 231.0* 265.0*   HbA1C: No results for input(s): HGBA1C in the last 72 hours. CBG: No results for input(s): GLUCAP in the last 168 hours. Lipid Profile: No results for input(s): CHOL, HDL, LDLCALC, TRIG, CHOLHDL, LDLDIRECT in the last 72 hours. Thyroid Function Tests: No results for input(s): TSH, T4TOTAL, FREET4, T3FREE, THYROIDAB in the last 72 hours. Anemia Panel: No results for input(s): VITAMINB12, FOLATE, FERRITIN, TIBC, IRON, RETICCTPCT in the last 72 hours. Urine analysis:    Component Value Date/Time   COLORURINE AMBER (A) 06/25/2017 1500   APPEARANCEUR CLEAR 06/25/2017 1500   LABSPEC >1.030 (H) 06/25/2017 1500   PHURINE 5.5 06/25/2017 1500   GLUCOSEU NEGATIVE 06/25/2017 1500   GLUCOSEU 250 (A) 04/14/2017 1108   HGBUR NEGATIVE 06/25/2017 1500   BILIRUBINUR NEGATIVE 06/25/2017 1500   BILIRUBINUR neg 12/02/2016 1508   KETONESUR NEGATIVE 06/25/2017 1500   PROTEINUR NEGATIVE 06/25/2017 1500   UROBILINOGEN 0.2 04/14/2017 1108   NITRITE NEGATIVE 06/25/2017 1500   LEUKOCYTESUR NEGATIVE 06/25/2017 1500    No results found for this or any previous visit (from the past 240 hour(s)).   Radiological Exams on Admission: Dg Chest 2 View  Result Date: 10/18/2017 CLINICAL DATA:  77 year old female with shortness of breath, hypoxia, and wheezing. EXAM: CHEST - 2 VIEW COMPARISON:  Chest radiograph dated 08/30/2017 FINDINGS: Emphysema with chronic interstitial  coarsening. No focal consolidation, pleural effusion, or pneumothorax. Mild cardiomegaly. Atherosclerotic calcification of the aortic arch. No acute osseous pathology. IMPRESSION: 1. No acute cardiopulmonary process. 2. Emphysema. Electronically Signed   By: Anner Crete M.D.   On: 10/18/2017 04:49   EKG: Independently reviewed. Sinus tachycardia with LAE, no ischemic changes.   Assessment/Plan Principal Problem:   Acute on chronic respiratory failure with hypoxia (HCC) Active Problems:   Breast cancer (HCC)   Thrombocytopenia (HCC)   Anxiety and depression   Encephalopathy, hepatic (HCC)   Diabetes mellitus type 2, controlled ([place])   Cirrhosis of liver without ascites (HCC)   NASH (nonalcoholic steatohepatitis)   Portal hypertensive gastropathy (HCC)   COPD exacerbation (HCC)   Acute hypoxic and hypercarbic respiratory failure on chronic hypoxic respiratory failure due to COPD/emphysema, possibly HCAP:  - ABG worsening, start BiPAP. PCCM consulted. - Check flu, empiric droplet precautions and tamiflu - Duonebs QID and albuterol prn - Continue abx and trend lactate, leukocytosis. No infiltrate on CXR, so would narrow abx once improving clinically.  - Monitor blood cultures  Hypotension: Possibly from sepsis, 30cc/kg IVF's given without significant improvement, may be due to benzo.  - Monitor, hold further fluids for now. CCM consulted.  - Hold diuretics  Acute encephalopathy: Due to sepsis, respiratory failure, ativan, possibly hepatic cirrhosis.  - Monitor closely in SDU - Will give lactulose empirically if able to tolerate.  IDDM: On levemir and novolog at SNF. Last HbA1c was 5.9%.  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  - Continue home lactulose at 30g dose and rifaximin.  - Hold lasix, spironolactone (also has torsemide on MAR, being held)  GERD, portal hypertensive gastropathy:  - Change PPI  to IV for now  Anxiety, depression:  - Hold ativan with encephalopathy  Breast CA: No evidence of disease.  Thrombocytopenia: Mild, monitor.  DVT prophylaxis: Lovenox  Code Status: Presumed full initially, but has gold form and is  DNR/DNI. Therefore will not be intubated. Family Communication: Discussed with friend briefly who gave me the phone number to the patient's HCPOA, Niece, Juliann Pulse, who agreed to have her number put into the chart [phone]) as point of contact for goals of care going forward. I shared my concern that she may not survive this hospitalization, though her Niece says "I've heard that before every month since December." She confirmed DNR/DNI and was made aware of the plan of care that would normally entail intubation, but will include the plan of care as above instead.  Disposition Plan: Uncertain Consults called: CCM  Admission status: Inpatient    Vance Gather, MD [facility] Pager [phone]  If 7PM-7AM, please contact night-coverage [email or web address] Password [facility] 10/18/2017, 9:58 AM

## 2017-10-18 NOTE — ED Notes (Signed)
Made respiratory aware of ABG ordered

## 2017-10-18 NOTE — Progress Notes (Signed)
Pharmacy Antibiotic Note  Ashley Savage is a 77 y.o. female admitted on 10/18/2017 with sepsis.  Pharmacy has been consulted for vancomycin and cefepime dosing.  Today, 10/18/2017 Day #1 antibiotics Tmax 100.6 WBC 24.5 Inc in Scr 1.41, est CrCl ~58ml/min Lactate 5.73  Plan: Decrease Cefepime 2g IV q24h Decrease Vancomycin 750mg  IV q24h for estimated AUC 457 Decrease Tamiflu to 30mg  po BID Check vancomycin levels at steady state, goal AUC 400-500 Follow up renal function & cultures     Temp (24hrs), Avg:98.9 F (37.2 C), Min:98.1 F (36.7 C), Max:100.6 F (38.1 C)  Recent Labs  Lab 10/15/17 10/18/17 0259 10/18/17 0357 10/18/17 0513 10/18/17 1302  WBC  --  24.5*  --   --  24.4*  CREATININE 0.89 0.93  --   --  1.41*  LATICACIDVEN  --   --  3.75* 5.73*  --     CrCl cannot be calculated (Unknown ideal weight.).    Allergies  Allergen Reactions  . Citalopram Palpitations    Irregular heart beat Irregular heart beat  . Ciprofloxacin Other (See Comments)    Mental status change Mental status change  . Erythromycin Other (See Comments)    Stomach cramps Stomach cramps  . Glimepiride Other (See Comments)    Elevated ammonia levels Elevated ammonia levels  . Prednisone     Increased blood sugars too high    Antimicrobials this admission: 3/25 cefepime >>  3/25 vancomycin >>  3/25 tamiflu >>  Dose adjustments this admission: 3/25: Decrease Cefepime to 2gm IV q24h, Vanc 750mg  IV q24h, Tamiflu 30mg  PO BID  Microbiology results: 3/25 BCx: 3/25 Respiratory panel: 3/25 MRSA PCR: negative  Thank you for allowing pharmacy to be a part of this patient's care.  Netta Cedars, PharmD, BCPS 10/18/2017 8:31 PM

## 2017-10-19 ENCOUNTER — Inpatient Hospital Stay (HOSPITAL_COMMUNITY): Payer: PPO

## 2017-10-19 DIAGNOSIS — J810 Acute pulmonary edema: Secondary | ICD-10-CM

## 2017-10-19 LAB — RESPIRATORY PANEL BY PCR
Adenovirus: NOT DETECTED
Bordetella pertussis: NOT DETECTED
CORONAVIRUS 229E-RVPPCR: NOT DETECTED
CORONAVIRUS HKU1-RVPPCR: NOT DETECTED
Chlamydophila pneumoniae: NOT DETECTED
Coronavirus NL63: NOT DETECTED
Coronavirus OC43: NOT DETECTED
INFLUENZA B-RVPPCR: NOT DETECTED
Influenza A: NOT DETECTED
METAPNEUMOVIRUS-RVPPCR: NOT DETECTED
Mycoplasma pneumoniae: NOT DETECTED
PARAINFLUENZA VIRUS 1-RVPPCR: NOT DETECTED
PARAINFLUENZA VIRUS 2-RVPPCR: NOT DETECTED
PARAINFLUENZA VIRUS 3-RVPPCR: NOT DETECTED
Parainfluenza Virus 4: NOT DETECTED
RESPIRATORY SYNCYTIAL VIRUS-RVPPCR: NOT DETECTED
RHINOVIRUS / ENTEROVIRUS - RVPPCR: NOT DETECTED

## 2017-10-19 LAB — BLOOD GAS, ARTERIAL
Acid-Base Excess: 2.5 mmol/L — ABNORMAL HIGH (ref 0.0–2.0)
Bicarbonate: 27 mmol/L (ref 20.0–28.0)
Drawn by: 422461
O2 Content: 2 L/min
O2 Saturation: 92.2 %
Patient temperature: 98.1
pCO2 arterial: 43 mmHg (ref 32.0–48.0)
pH, Arterial: 7.412 (ref 7.350–7.450)
pO2, Arterial: 65.5 mmHg — ABNORMAL LOW (ref 83.0–108.0)

## 2017-10-19 LAB — GLUCOSE, CAPILLARY
GLUCOSE-CAPILLARY: 248 mg/dL — AB (ref 65–99)
GLUCOSE-CAPILLARY: 245 mg/dL — AB (ref 65–99)
Glucose-Capillary: 157 mg/dL — ABNORMAL HIGH (ref 65–99)
Glucose-Capillary: 181 mg/dL — ABNORMAL HIGH (ref 65–99)
Glucose-Capillary: 279 mg/dL — ABNORMAL HIGH (ref 65–99)

## 2017-10-19 LAB — CBC
HCT: 31.8 % — ABNORMAL LOW (ref 36.0–46.0)
Hemoglobin: 10.5 g/dL — ABNORMAL LOW (ref 12.0–15.0)
MCH: 32 pg (ref 26.0–34.0)
MCHC: 33 g/dL (ref 30.0–36.0)
MCV: 97 fL (ref 78.0–100.0)
PLATELETS: 101 10*3/uL — AB (ref 150–400)
RBC: 3.28 MIL/uL — AB (ref 3.87–5.11)
RDW: 15.1 % (ref 11.5–15.5)
WBC: 22.4 10*3/uL — ABNORMAL HIGH (ref 4.0–10.5)

## 2017-10-19 LAB — BASIC METABOLIC PANEL WITH GFR
Anion gap: 8 (ref 5–15)
BUN: 50 mg/dL — ABNORMAL HIGH (ref 6–20)
CO2: 26 mmol/L (ref 22–32)
Calcium: 8.6 mg/dL — ABNORMAL LOW (ref 8.9–10.3)
Chloride: 104 mmol/L (ref 101–111)
Creatinine, Ser: 1.34 mg/dL — ABNORMAL HIGH (ref 0.44–1.00)
GFR calc Af Amer: 44 mL/min — ABNORMAL LOW
GFR calc non Af Amer: 38 mL/min — ABNORMAL LOW
Glucose, Bld: 210 mg/dL — ABNORMAL HIGH (ref 65–99)
Potassium: 4.2 mmol/L (ref 3.5–5.1)
Sodium: 138 mmol/L (ref 135–145)

## 2017-10-19 LAB — PROCALCITONIN: PROCALCITONIN: 7.27 ng/mL

## 2017-10-19 LAB — AMMONIA: Ammonia: 30 umol/L (ref 9–35)

## 2017-10-19 LAB — MAGNESIUM: Magnesium: 2.4 mg/dL (ref 1.7–2.4)

## 2017-10-19 LAB — PHOSPHORUS: PHOSPHORUS: 3.7 mg/dL (ref 2.5–4.6)

## 2017-10-19 MED ORDER — FUROSEMIDE 10 MG/ML IJ SOLN
40.0000 mg | Freq: Once | INTRAMUSCULAR | Status: AC
  Administered 2017-10-19: 40 mg via INTRAVENOUS
  Filled 2017-10-19: qty 4

## 2017-10-19 MED ORDER — FUROSEMIDE 10 MG/ML IJ SOLN
40.0000 mg | Freq: Three times a day (TID) | INTRAMUSCULAR | Status: AC
  Administered 2017-10-19 – 2017-10-20 (×2): 40 mg via INTRAVENOUS
  Filled 2017-10-19 (×2): qty 4

## 2017-10-19 MED ORDER — IPRATROPIUM-ALBUTEROL 0.5-2.5 (3) MG/3ML IN SOLN
3.0000 mL | Freq: Three times a day (TID) | RESPIRATORY_TRACT | Status: DC
  Administered 2017-10-20 – 2017-10-22 (×8): 3 mL via RESPIRATORY_TRACT
  Filled 2017-10-19 (×8): qty 3

## 2017-10-19 MED ORDER — METHYLPREDNISOLONE SODIUM SUCC 40 MG IJ SOLR
40.0000 mg | Freq: Three times a day (TID) | INTRAMUSCULAR | Status: DC
  Administered 2017-10-19 – 2017-10-21 (×5): 40 mg via INTRAVENOUS
  Filled 2017-10-19 (×6): qty 1

## 2017-10-19 MED ORDER — ARFORMOTEROL TARTRATE 15 MCG/2ML IN NEBU
15.0000 ug | INHALATION_SOLUTION | Freq: Two times a day (BID) | RESPIRATORY_TRACT | Status: DC
  Administered 2017-10-19 – 2017-10-22 (×7): 15 ug via RESPIRATORY_TRACT
  Filled 2017-10-19 (×6): qty 2

## 2017-10-19 MED ORDER — BUDESONIDE 0.5 MG/2ML IN SUSP
0.5000 mg | Freq: Two times a day (BID) | RESPIRATORY_TRACT | Status: DC
  Administered 2017-10-19 – 2017-10-22 (×7): 0.5 mg via RESPIRATORY_TRACT
  Filled 2017-10-19 (×6): qty 2

## 2017-10-19 NOTE — Progress Notes (Signed)
Nutrition Brief Note  Consult per COPD Gold Protocol.   Wt Readings from Last 15 Encounters:  10/19/17 156 lb 4.9 oz (70.9 kg)  10/04/17 176 lb (79.8 kg)  10/01/17 176 lb (79.8 kg)  07/14/17 176 lb (79.8 kg)  07/07/17 183 lb (83 kg)  06/26/17 160 lb 15 oz (73 kg)  06/24/17 157 lb 3.2 oz (71.3 kg)  05/26/17 167 lb 9.6 oz (76 kg)  05/24/17 172 lb (78 kg)  05/22/17 159 lb (72.1 kg)  05/20/17 159 lb (72.1 kg)  05/17/17 159 lb (72.1 kg)  05/14/17 145 lb (65.8 kg)  05/05/17 145 lb (65.8 kg)  04/13/17 150 lb 12.8 oz (68.4 kg)    Body mass index is 25.23 kg/m. Patient meets criteria for overweight based on current BMI. It appears that weights between 07/07/17-10/04/17 were stated, rather than scale-obtained weights. Per chart review, pt weighed 155 lbs at [facility] on 06/06/14, 148 lbs at [facility] on 08/08/15, and 155 lbs at [facility] on 09/08/17. Skin WDL.  Pt arrived to ED 2/2 SOB and found to be severely acidotic and agitated. She was placed on BiPAP but was able to transition to [place] yesterday around 7:00 PM. She has hx of cirrhosis without ascites.   Current diet order is Carb Modified. Diet was advanced from NPO to current order at 8:45 AM today.  Medications reviewed; 40 mg IV Lasix TID, sliding scale Novolog, 30 g lactulose QID, 60 mg Solu-medrol QID. Labs reviewed; CBGs: 157, 181, and 279 mg/dL today, BUN: 50 mg/dL, creatinine: 1.34 mg/dL, Ca: 8.6 mg/dL, GFR: 38 mL/min.    No nutrition interventions warranted at this time. If nutrition issues arise, please consult RD.     Jarome Matin, MS, RD, LDN, [facility] Inpatient Clinical Dietitian Pager # [phone] After hours/weekend pager # [phone]

## 2017-10-19 NOTE — Progress Notes (Signed)
PROGRESS NOTE [facility]   Ashley Savage   NAT:[id number] DOB: [birth year]  DOA: 10/18/2017 PCP: Hennie Duos, MD   Brief Narrative:  Ashley Savage is a 77 year old female with history of COPD O2 dependent, hepatic cirrhosis, diabetes mellitus type 2 who presented to the emergency department with altered mental status, and respiratory distress.  Patient does not remember how she got to the emergency department but prior to arrival she was complaining of feeling funny and having productive cough with yellow sputum for about a week.  Upon ED evaluation she was found to be febrile with T-max of 100.6, respiratory distress with hypoxia, elevated lactic acid and somnolent.  Ammonia levels were mildly elevated at 47.  Patient was placed on BiPAP, started on IV fluids and empiric antibiotics.  Patient was admitted with working diagnosis of acute on chronic respiratory failure with hypoxia complicated with hepatic encephalopathy.  Subjective: Patient seen and examined, report feeling significantly better today her breathing has improved from feel almost back to baseline.  She does not feel confused she reported that have mild dementia and forget things hearing there but feels pretty normal today.  Patient oriented x3.  No other concerns.  Denies chest pain, palpitations and dizziness.  Assessment & Plan: Acute on chronic respiratory failure with hypoxia and hypercarbia likely secondary to COPD exacerbation, pulmonary edema also contributing Patient off BiPAP, PCCM recommendations appreciated Respiratory panel negative Patient being treated with cefepime and vancomycin, chest x-ray not really convincing of infectious process although WBC and pro-calcitonin is significantly elevated I will be inclined to continue treatment for at least 24 more hours and de-escalate antibiotic therapy if remains afebrile and clinically improving.  Can DC Tamiflu. We will add Brovana and DuoNeb.  Continue  Solu-Medrol taper and nebulizer treatment.  Will start diuresis for 3 doses and monitor in a.m. Continue O2 supplementation as needed to keep saturations above 89%.  Acute metabolic/hepatic encephalopathy - improved seems to be back to baseline Multifactorial with hepatic encephalopathy, hypoxia, dementia and possible infectious process Continue lactulose, goal of at least 3 bowel movement per day.  Ammonia level improved Treat underlying causes.  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  Mild thrombocytopenia Likely due to hepatic disease Continue to monitor  DVT prophylaxis: Lovenox Code Status: DNR/DNI Family Communication: None at bedside Disposition Plan: Transferred to telemetry, back to SNF when respiratory status improved.  Consultants:   P CCM  Procedures:   None  Antimicrobials: Anti-infectives (From admission, onward)   Start     Dose/Rate Route Frequency Ordered Stop   10/19/17 1000  vancomycin (VANCOCIN) IVPB 1000 mg/200 mL premix  Status:  Discontinued     1,000 mg 200 mL/hr over 60 Minutes Intravenous Every 24 hours 10/18/17 1307 10/18/17 2031   10/19/17 1000  ceFEPIme (MAXIPIME) 2 g in sodium chloride 0.9 % 100 mL IVPB     2 g 200 mL/hr over 30 Minutes Intravenous Every 24 hours 10/18/17 2031     10/19/17 1000  vancomycin (VANCOCIN) IVPB 750 mg/150 ml premix     750 mg 150 mL/hr over 60 Minutes Intravenous Every 24 hours 10/18/17 2031     10/18/17 2200  oseltamivir (TAMIFLU) capsule 30 mg     30 mg Oral 2 times daily 10/18/17 2031 10/23/17 0959  10/18/17 1400  rifaximin (XIFAXAN) tablet 550 mg     550 mg Oral 2 times daily 10/18/17 1152     10/18/17 1400  ceFEPIme (MAXIPIME) 1 g in sodium chloride 0.9 % 100 mL IVPB  Status:   Discontinued     1 g 200 mL/hr over 30 Minutes Intravenous Every 8 hours 10/18/17 1301 10/18/17 2031   10/18/17 1100  oseltamivir (TAMIFLU) capsule 75 mg  Status:  Discontinued     75 mg Oral 2 times daily 10/18/17 0958 10/18/17 2031   10/18/17 0415  ceFEPIme (MAXIPIME) 2 g in sodium chloride 0.9 % 100 mL IVPB     2 g 200 mL/hr over 30 Minutes Intravenous  Once 10/18/17 0407 10/18/17 0523   10/18/17 0415  vancomycin (VANCOCIN) IVPB 1000 mg/200 mL premix  Status:  Discontinued     1,000 mg 200 mL/hr over 60 Minutes Intravenous  Once 10/18/17 0407 10/18/17 0412   10/18/17 0415  vancomycin (VANCOCIN) 1,500 mg in sodium chloride 0.9 % 500 mL IVPB     1,500 mg 250 mL/hr over 120 Minutes Intravenous  Once 10/18/17 0412 10/18/17 0650           Objective: Vitals:   10/19/17 0322 10/19/17 0400 10/19/17 0500 10/19/17 0600  BP:  (!) 93/39 (!) 112/35 (!) 107/40  Pulse:  96    Resp:  (!) 22 (!) 23 (!) 24  Temp: 98.8 F (37.1 C)     TempSrc: Oral     SpO2:  96% 95% 98%  Weight: 70.9 kg (156 lb 4.9 oz)      No intake or output data in the 24 hours ending 10/19/17 0835 Filed Weights   10/19/17 0322  Weight: 70.9 kg (156 lb 4.9 oz)    Examination:  General exam: Calm, NAD HEENT: OP moist and clear Respiratory system: Decreased breath sounds, bibasilar crackles, no wheezing Cardiovascular system: S1S2 heard, RRR. murmurs, rubs or gallops Gastrointestinal system: Abdomen is nondistended, soft and nontender.  Central nervous system: Awake and alert oriented x3 no focal deficit Extremities: Bilateral lower extremity edema 1+ Skin: No rashes, lesions or ulcers Psychiatry:  Mood & affect appropriate.    Data Reviewed: I have personally reviewed following labs and imaging studies  CBC: Recent Labs  Lab 10/18/17 0259 10/18/17 1302  WBC 24.5* 24.4*  NEUTROABS 21.0*  --   HGB 13.0 11.9*  HCT 39.7 37.8  MCV 97.1 101.3*  PLT 131* 573*   Basic Metabolic Panel: Recent Labs  Lab  10/15/17 10/18/17 0259 10/18/17 1302  NA 140 140  --   K 4.8 4.0  --   CL  --  100*  --   CO2  --  27  --   GLUCOSE  --  152*  --   BUN 14 23*  --   CREATININE 0.89 0.93 1.41*  CALCIUM 8.5 9.0  --    GFR: Estimated Creatinine Clearance: 32.8 mL/min (A) (by C-G formula based on SCr of 1.41 mg/dL (H)). Liver Function Tests: Recent Labs  Lab 10/15/17 10/18/17 0259  AST 28 40  ALT 19 22  ALKPHOS 191 187*  BILITOT 1.2 3.8*  PROT 5.1 5.7*  ALBUMIN 2.2 2.3*   No results for input(s): LIPASE, AMYLASE in the last 168 hours. Recent Labs  Lab 10/18/17 0627 10/18/17 1302  AMMONIA 47* 56*   Coagulation Profile: No results for input(s): INR, PROTIME in the last 168 hours. Cardiac Enzymes: No results for input(s): CKTOTAL, CKMB, CKMBINDEX, TROPONINI in  the last 168 hours. BNP (last 3 results) Recent Labs    05/26/17 1438 07/14/17 1208  PROBNP 231.0* 265.0*   HbA1C: No results for input(s): HGBA1C in the last 72 hours. CBG: Recent Labs  Lab 10/18/17 1546 10/18/17 1935 10/18/17 2321 10/19/17 0315 10/19/17 0819  GLUCAP 253* 230* 205* 157* 181*   Lipid Profile: No results for input(s): CHOL, HDL, LDLCALC, TRIG, CHOLHDL, LDLDIRECT in the last 72 hours. Thyroid Function Tests: No results for input(s): TSH, T4TOTAL, FREET4, T3FREE, THYROIDAB in the last 72 hours. Anemia Panel: No results for input(s): VITAMINB12, FOLATE, FERRITIN, TIBC, IRON, RETICCTPCT in the last 72 hours. Sepsis Labs: Recent Labs  Lab 10/18/17 0357 10/18/17 0513  LATICACIDVEN 3.75* 5.73*    Recent Results (from the past 240 hour(s))  MRSA PCR Screening     Status: None   Collection Time: 10/18/17 12:30 PM  Result Value Ref Range Status   MRSA by PCR NEGATIVE NEGATIVE Final    Comment:        The GeneXpert MRSA Assay (FDA approved for NASAL specimens only), is one component of a comprehensive MRSA colonization surveillance program. It is not intended to diagnose MRSA infection nor to guide  or monitor treatment for MRSA infections. Performed at [facility], [place] [address] [place], [place] [address]       Radiology Studies: Dg Chest 2 View  Result Date: 10/18/2017 CLINICAL DATA:  77 year old female with shortness of breath, hypoxia, and wheezing. EXAM: CHEST - 2 VIEW COMPARISON:  Chest radiograph dated 08/30/2017 FINDINGS: Emphysema with chronic interstitial coarsening. No focal consolidation, pleural effusion, or pneumothorax. Mild cardiomegaly. Atherosclerotic calcification of the aortic arch. No acute osseous pathology. IMPRESSION: 1. No acute cardiopulmonary process. 2. Emphysema. Electronically Signed   By: Anner Crete M.D.   On: 10/18/2017 04:49   Dg Chest Port 1 View  Result Date: 10/19/2017 CLINICAL DATA:  Followup respiratory failure. EXAM: PORTABLE CHEST 1 VIEW COMPARISON:  10/18/2017 FINDINGS: The patient is rotated towards the right. Interstitial markings appear more prominent, suggesting fluid overload/early interstitial edema. No consolidation or collapse. Possible tiny effusions. IMPRESSION: Suspicion of fluid overload/early interstitial edema. Tiny effusions. No consolidation or lobar collapse. This film is rotated towards the right. Electronically Signed   By: Nelson Chimes M.D.   On: 10/19/2017 07:44      Scheduled Meds: . arformoterol  15 mcg Nebulization BID  . budesonide (PULMICORT) nebulizer solution  0.5 mg Nebulization BID  . enoxaparin (LOVENOX) injection  40 mg Subcutaneous Q24H  . furosemide  40 mg Intravenous Once  . insulin aspart  0-9 Units Subcutaneous Q4H  . ipratropium-albuterol  3 mL Nebulization Q6H  . lactulose  30 g Oral Q6H  . methylPREDNISolone (SOLU-MEDROL) injection  60 mg Intravenous Q6H  . oseltamivir  30 mg Oral BID  . pantoprazole (PROTONIX) IV  40 mg Intravenous Q24H  . rifaximin  550 mg Oral BID  . sodium chloride flush  3 mL Intravenous Q12H  . sodium chloride flush  3 mL Intravenous Q12H    Continuous Infusions: . sodium chloride    . ceFEPime (MAXIPIME) IV    . vancomycin       LOS: 1 day    Time spent: Total of 35 minutes spent with pt, greater than 50% of which was spent in discussion of  treatment, counseling and coordination of care   Chipper Oman, MD Pager: Text Page via [email or web address]   If 7PM-7AM, please contact night-coverage [email or web address] 10/19/2017, 8:35 AM  Note - This record has been created using [employer]. Chart creation errors have been sought, but may not always have been located. Such creation errors do not reflect on the standard of medical care.

## 2017-10-19 NOTE — Progress Notes (Addendum)
PULMONARY / CRITICAL CARE MEDICINE   Name: Ashley Savage MRN: [id number] DOB: 08/04/1940    ADMISSION DATE:  10/18/2017 CONSULTATION DATE:  10/18/2017  REFERRING MD:  Nile Riggs Bonner Puna  CHIEF COMPLAINT:  AMS and respiratory failure  HISTORY OF PRESENT ILLNESS:   77 year old female with extensive PMH who presents to the ER with SOB.  Patient was noted to be profoundly acidotic and agitated.  Was given IV ativan and subsequently became obtunded and PCCM was consulted.  Patient is not groaning only to pain so unable to ascertain further history so all obtained from the chart.  Cough productive of yellow/green sputum, no fevers at home but febrile in the hospital.  WBC elevated and lactic acid is increasing.  BP is soft now that patient has severe acidosis.  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  Bili rising.  SUBJECTIVE:  No events overnight, came off BiPAP at 7PM on 3/25, no new complaints  VITAL SIGNS: BP (!) 107/40   Pulse 99   Temp 98.4 F (36.9 C) (Oral)   Resp (!) 22   Wt 156 lb 4.9 oz (70.9 kg)   LMP  (LMP Unknown)   SpO2 96%   BMI 25.23 kg/m   HEMODYNAMICS:    VENTILATOR SETTINGS: Vent Mode: BIPAP;PCV FiO2 (%):  [40 %] 40 % Set Rate:  [10 bmp] 10 bmp PEEP:  [5 cmH20] 5 cmH20  INTAKE / OUTPUT: I/O last 3 completed shifts: In: 3150 [IV Piggyback:3150] Out: -   PHYSICAL EXAMINATION: General:  Chronically ill appearing female, NAD Neuro:  Awake and interactive, moving all ext to commands HEENT:  [place]/AT, PERRL, EOM-I and MMM Cardiovascular:  RRR, Nl S1/S2 and -M/R/G. Lungs:  End exp wheezes noted Abdomen:  Soft, NT, ND and +BS Musculoskeletal:  2+ edema and -tenderness Skin:  Intact but thin  LABS:  BMET Recent Labs  Lab 10/15/17 10/18/17 0259 10/18/17 1302  NA 140 140  --   K 4.8 4.0  --   CL  --  100*  --   CO2  --  27  --   BUN 14 23*  --   CREATININE 0.89 0.93 1.41*  GLUCOSE  --  152*  --    Electrolytes Recent Labs  Lab 10/15/17 10/18/17 0259   CALCIUM 8.5 9.0   CBC Recent Labs  Lab 10/18/17 0259 10/18/17 1302  WBC 24.5* 24.4*  HGB 13.0 11.9*  HCT 39.7 37.8  PLT 131* 126*   Coag's No results for input(s): APTT, INR in the last 168 hours.  Sepsis Markers Recent Labs  Lab 10/18/17 0357 10/18/17 0513  LATICACIDVEN 3.75* 5.73*   ABG Recent Labs  Lab 10/18/17 0610 10/18/17 0853 10/19/17 0424  PHART 7.310* 7.161* 7.412  PCO2ART 38.3 47.4 43.0  PO2ART 77.5* 114* 65.5*   Liver Enzymes Recent Labs  Lab 10/15/17 10/18/17 0259  AST 28 40  ALT 19 22  ALKPHOS 191 187*  BILITOT 1.2 3.8*  ALBUMIN 2.2 2.3*   Cardiac Enzymes No results for input(s): TROPONINI, PROBNP in the last 168 hours.  Glucose Recent Labs  Lab 10/18/17 1546 10/18/17 1935 10/18/17 2321 10/19/17 0315 10/19/17 0819  GLUCAP 253* 230* 205* 157* 181*   Imaging Dg Chest Port 1 View  Result Date: 10/19/2017 CLINICAL DATA:  Followup respiratory failure. EXAM: PORTABLE CHEST 1 VIEW COMPARISON:  10/18/2017 FINDINGS: The patient is rotated towards the right. Interstitial markings appear more prominent, suggesting fluid overload/early interstitial edema. No consolidation or collapse. Possible tiny effusions.  IMPRESSION: Suspicion of fluid overload/early interstitial edema. Tiny effusions. No consolidation or lobar collapse. This film is rotated towards the right. Electronically Signed   By: Nelson Chimes M.D.   On: 10/19/2017 07:44   I reviewed CXR myself, evolving pulmonary edema.  STUDIES:  Head CT 3/25>>>Nil acute CXR 3/25>>>Hyperinflation but no infiltrate noted that I looked at myself  CULTURES: Blood 3/25>>> Urine 3/25>>> Sputum 3/25>>>  ANTIBIOTICS: Cefepime 3/25>>> Vancomycin 3/25>>>  SIGNIFICANT EVENTS:  3/25>>> admission for AMS, metabolic acidosis and respiratory failure.  LINES/TUBES: PIV  DISCUSSION: 77 year old female with PMH of cirrhosis amongst a myriad of other medical problems presenting with AMS, VDRF and lactic  acidosis that is now altered and her airway protection is questionable at best.  ASSESSMENT / PLAN:  PULMONARY A: VDRF due to inability to protect airway and ?early sepsis P:   - DNR status noted - D/C BiPAP - Titrate O2 for sat of 88-92% - Treat sepsis - Duonebs - PRN albuterol - Solumedrol - Active diureses today  CARDIOVASCULAR A:  Septic shock, source unclear P:  - KVO IVF - 2D echo pending - Lasix as below  RENAL A:   Acute metabolic acidosis in the setting of severe liver disease P:   - KVO IVF - Lasix 40 mg IV q8 x2 doses - BMET in AM - Replace electrolytes as indicated  GASTROINTESTINAL A:   Cirrhosis unknown cause P:   - Lactulose as ordered - Diet per TRH - Rifaximin   HEMATOLOGIC A:   Leukocytosis P:  - CBC in AM - Transfuse per ICU protocol  INFECTIOUS A:   Sepsis unknown source ?Flu P:   - RVP pending - Influenza testing A & B negative - Vanc/cefepime - Pan cultures and F/U  ENDOCRINE A:   DM   P:   - ISS - CBG  NEUROLOGIC A:   AMS unknown cause, neck supple, doubt meningitis P:   RASS goal: 0 - Minimize sedation as able - No further benzos unless intubated - Ammonia level 56 - Lactulose  FAMILY  - Updates: Patient updated bedside  - Inter-disciplinary family meet or Palliative Care meeting due by:  day 7  Transfer out of the ICU, diureses as ordered, PCCM will sign off, please call back if needed.  Rush Farmer, M.D. [facility] Pulmonary/Critical Care Medicine. Pager: [phone]. After hours pager: [phone].  10/19/2017, 9:16 AM

## 2017-10-19 NOTE — Clinical Social Work Note (Signed)
Clinical Social Work Assessment  Patient Details  Name: KATERYN MARASIGAN MRN: [id number] Date of Birth: 24-Sep-1940  Date of referral:  10/19/17               Reason for consult:  Facility Placement                Permission sought to share information with:  Family Supports Permission granted to share information::  Yes, Verbal Permission Granted  Name::      Economist   Agency::  [place]   Relationship::  Niece   Contact Information:   [phone]  Housing/Transportation Living arrangements for the past 2 months:  [place] of Information:  Patient Patient Interpreter Needed:  None Criminal Activity/Legal Involvement Pertinent to Current Situation/Hospitalization:  No - Comment as needed Significant Relationships:  Friend Lives with:  Facility Resident Do you feel safe going back to the place where you live?  No Need for family participation in patient care:  Yes  Care giving concerns:   No concerns presented.   Social Worker assessment / plan:   CSW met with the patient at beside. Patient alert, oriented and agreeable to assessment.  Patient reports she has been at [facility] for the past few weeks for rehab.She reports prior to rehab she was at [facility] SNF for rehab as well. She reports while at the facility she uses a wheel chair and can transfer.The patient reports she has a niece out of town that helps manage her finances and involved in her care.   Patient reports at discharge the plan is for her to return to [facility].  CSW spoke to [place] the facility liaison, the facility will accept the patient back at discharge.   Patient gave CSW permission to talk with her niece. CSW left voicemail for the patient niece.    CSW spoke with the patient niece. She reports the patient has been back and forth in the hospital and SNF since December of last year. She reports the patient was diagnosis with COPD, Hepatic cirrhosis  of the liver due to NASH and had to undergo dialysis treatment.  Prior to [employer] the patient was at [facility] and then transitioning to [employer] with palliative care services following for extra support. She explained the patient has been at [facility] for the past two weeks completing physical therapy, and has benefited well from it.  She reports the plan is for the patient to return to the facility at discharge and continue to rehab.   Plan: SNF  Employment status:  Retired Forensic scientist:  [employer]) PT Recommendations:  Not assessed at this time Information / Referral to community resources:  [place]  Patient/Family's Response to care:  Agreeable and Responding well to care.   Patient/Family's Understanding of and Emotional Response to Diagnosis, Current Treatment, and Prognosis:  Patient able to explain some of her medical history and past procedures. Patient niece provided extensive medical history and has a good understanding of patient diagnosis and current treatment.   Emotional Assessment Appearance:  Appears stated age Attitude/Demeanor/Rapport:    Affect (typically observed):  Accepting, Pleasant Orientation:  Oriented to Self, Oriented to Place, Oriented to Situation Alcohol / Substance use:  Not Applicable Psych involvement (Current and /or in the community):  No (Comment)  Discharge Needs  Concerns to be addressed:  Discharge Planning Concerns, Decision making concerns Readmission within the last 30 days:  No  Current discharge risk:  Dependent with Mobility Barriers to Discharge:  Continued Medical Work up   [employer], LCSW 10/19/2017, 12:15 PM

## 2017-10-19 NOTE — Consult Note (Signed)
   [facility] Inpatient Consult   10/19/2017  Ashley Savage [birth year] [id number]   Ashley Savage has been active with [facility] Care Management. She has been followed by [facility] LCSW while at [facility]. Please see chart review tab then encounters for patient outreach details.   Went to bedside to speak with Ashley Savage. However, she was resting soundly. Did not want to disturb.   Made inpatient RNCM and inpatient LCSW aware that [place] Management is active.  Will continue to follow and update [place].   [name], MSN-Ed, RN,BSN [facility] Liaison [phone]

## 2017-10-20 ENCOUNTER — Other Ambulatory Visit: Payer: Self-pay | Admitting: Licensed Clinical Social Worker

## 2017-10-20 ENCOUNTER — Inpatient Hospital Stay (HOSPITAL_COMMUNITY): Payer: PPO

## 2017-10-20 DIAGNOSIS — A419 Sepsis, unspecified organism: Secondary | ICD-10-CM

## 2017-10-20 LAB — GLUCOSE, CAPILLARY
GLUCOSE-CAPILLARY: 368 mg/dL — AB (ref 65–99)
GLUCOSE-CAPILLARY: 348 mg/dL — AB (ref 65–99)
Glucose-Capillary: 228 mg/dL — ABNORMAL HIGH (ref 65–99)
Glucose-Capillary: 262 mg/dL — ABNORMAL HIGH (ref 65–99)
Glucose-Capillary: 289 mg/dL — ABNORMAL HIGH (ref 65–99)
Glucose-Capillary: 316 mg/dL — ABNORMAL HIGH (ref 65–99)

## 2017-10-20 LAB — BASIC METABOLIC PANEL
ANION GAP: 11 (ref 5–15)
BUN: 59 mg/dL — ABNORMAL HIGH (ref 6–20)
CALCIUM: 9 mg/dL (ref 8.9–10.3)
CHLORIDE: 99 mmol/L — AB (ref 101–111)
CO2: 23 mmol/L (ref 22–32)
CREATININE: 1.51 mg/dL — AB (ref 0.44–1.00)
GFR calc non Af Amer: 33 mL/min — ABNORMAL LOW (ref >60)
GFR, EST AFRICAN AMERICAN: 38 mL/min — AB (ref >60)
Glucose, Bld: 305 mg/dL — ABNORMAL HIGH (ref 65–99)
Potassium: 4.3 mmol/L (ref 3.5–5.1)
SODIUM: 133 mmol/L — AB (ref 135–145)

## 2017-10-20 LAB — CBC
HCT: 33.1 % — ABNORMAL LOW (ref 36.0–46.0)
Hemoglobin: 11 g/dL — ABNORMAL LOW (ref 12.0–15.0)
MCH: 32.3 pg (ref 26.0–34.0)
MCHC: 33.2 g/dL (ref 30.0–36.0)
MCV: 97.1 fL (ref 78.0–100.0)
PLATELETS: 128 10*3/uL — AB (ref 150–400)
RBC: 3.41 MIL/uL — ABNORMAL LOW (ref 3.87–5.11)
RDW: 15.6 % — AB (ref 11.5–15.5)
WBC: 24.8 10*3/uL — AB (ref 4.0–10.5)

## 2017-10-20 LAB — PHOSPHORUS: PHOSPHORUS: 3 mg/dL (ref 2.5–4.6)

## 2017-10-20 LAB — MAGNESIUM: MAGNESIUM: 2.3 mg/dL (ref 1.7–2.4)

## 2017-10-20 MED ORDER — FUROSEMIDE 10 MG/ML IJ SOLN
40.0000 mg | Freq: Two times a day (BID) | INTRAMUSCULAR | Status: AC
  Administered 2017-10-20 – 2017-10-21 (×2): 40 mg via INTRAVENOUS
  Filled 2017-10-20 (×2): qty 4

## 2017-10-20 MED ORDER — INSULIN GLARGINE 100 UNIT/ML ~~LOC~~ SOLN
15.0000 [IU] | Freq: Every day | SUBCUTANEOUS | Status: DC
  Administered 2017-10-20 – 2017-10-22 (×3): 15 [IU] via SUBCUTANEOUS
  Filled 2017-10-20 (×3): qty 0.15

## 2017-10-20 MED ORDER — PANTOPRAZOLE SODIUM 40 MG IV SOLR
40.0000 mg | INTRAVENOUS | Status: DC
  Administered 2017-10-20 – 2017-10-22 (×3): 40 mg via INTRAVENOUS
  Filled 2017-10-20 (×3): qty 40

## 2017-10-20 MED ORDER — ALUM & MAG HYDROXIDE-SIMETH 200-200-20 MG/5ML PO SUSP
15.0000 mL | ORAL | Status: DC | PRN
  Administered 2017-10-20: 15 mL via ORAL
  Filled 2017-10-20: qty 30

## 2017-10-20 MED ORDER — ORAL CARE MOUTH RINSE
15.0000 mL | Freq: Two times a day (BID) | OROMUCOSAL | Status: DC
  Administered 2017-10-20 – 2017-10-21 (×4): 15 mL via OROMUCOSAL

## 2017-10-20 MED ORDER — PHENOL 1.4 % MT LIQD
1.0000 | OROMUCOSAL | Status: DC | PRN
  Filled 2017-10-20: qty 177

## 2017-10-20 NOTE — Patient Outreach (Signed)
[facility]) Care Management  10/20/2017  Ashley Savage May 30, 1941 [id number]  Assessment- CSW arrived at [facility] on 10/19/17 to complete SNF visit. Patient was not in her room and nurse informed [facility] CSW that patient had been hospitalized on 10/18/17. CSW completed chart review. CSW completed call to [facility] and discussed case. CSW informed [facility] Liaison that Palliative Care visited with patient while at SNF but no further updates have been received in relation to this. CSW will await for discharge disposition.   Plan-CSW will await for hospital discharge and will continue to follow case.  [name], BSW, MSW, [email or web address] Phone: [phone] Fax: [phone]

## 2017-10-20 NOTE — Evaluation (Signed)
Clinical/Bedside Swallow Evaluation Patient Details  Name: Ashley Savage MRN: [id number] Date of Birth: 1941/07/14  Today's Date: 10/20/2017 Time: SLP Start Time (ACUTE ONLY): 79 SLP Stop Time (ACUTE ONLY): 1037 SLP Time Calculation (min) (ACUTE ONLY): 17 min  Past Medical History:  Past Medical History:  Diagnosis Date  . Abdominal aortic aneurysm ([place]) 10/01/2013   Fall of 2014 3.3 per patient, follows with Vascular surgeon.   . Acute respiratory failure with hypoxia ([place]) 07/31/2015  . Allergic state 11/10/2016  . Anxiety   . Anxiety and depression 02/01/2014  . Arthritis of both knees 10/01/2013  . Arthritis of right knee 10/01/2013   Follows with Dr Mayer Camel   . Benign paroxysmal positional vertigo 10/01/2013  . Breast cancer ([place])    No disease activity On Femara Follows with Dr Marin Olp Right mastectomy performed by Dr Autumn Messing   . Cancer [facility]) breast ca  right  . Chronic respiratory failure ([place]) 08/29/2015  . Cirrhosis of liver without ascites ([place]) 07/31/2015  . COPD (chronic obstructive pulmonary disease) ([place]) 10/01/2013  . COPD with acute exacerbation ([place]) 05/05/2017  . Depression   . Dermatitis 03/30/2017  . Diabetes mellitus type 2  . Diabetes mellitus type 2, controlled ([place]) 10/16/2014  . Emphysema   . Encephalopathy, hepatic ([place]) 06/07/2014  . Esophageal reflux 10/01/2013  . Fall 07/30/2016  . Hyperlipidemia   . Hyperlipidemia, mixed   . Increased ammonia level 11/25/2014  . NASH (nonalcoholic steatohepatitis) 08/26/2015  . Neck pain 10/01/2013  . Neuropathy    feet   . Osteopenia 03/30/2017  . Overactive bladder 12/10/2013  . Panic attacks   . Pedal edema 12/10/2013  . Personal history of radiation therapy   . Preventative health care 03/08/2016  . Thrombocytopenia ([place]) 03/17/2012  . Tobacco abuse disorder 02/01/2014  . Type 2 diabetes mellitus with hyperglycemia, with long-term current use of insulin ([place])   . Urine frequency 04/13/2017   Past Surgical History:  Past Surgical  History:  Procedure Laterality Date  . APPENDECTOMY  2007  . BREAST SURGERY  2009 right  . CATARACT EXTRACTION     x 2  . ESOPHAGOGASTRODUODENOSCOPY (EGD) WITH PROPOFOL N/A 08/14/2016   Procedure: ESOPHAGOGASTRODUODENOSCOPY (EGD) WITH PROPOFOL;  Surgeon: Mauri Pole, MD;  Location: [facility] ENDOSCOPY;  Service: Endoscopy;  Laterality: N/A;  . Learned  . KNEE SURGERY    . MANDIBLE FRACTURE SURGERY    . MASTECTOMY    . PILONIDAL CYST EXCISION    . TONSILLECTOMY     HPI:  Pt arrived to ED 2/2 SOB and found to be severely acidotic and agitated. She was placed on BiPAP but was able to transition to  yesterday around 7:00 PM. She has hx of cirrhosis without ascites.  Swallow evaluation ordered due to concerns for possible aspiration.  She was given Ativan and then was obtunded.  Pt CXR showed tiny effusion.     Assessment / Plan / Recommendation Clinical Impression  Pt presents with signs/symptoms consistent with esophageal dysphagia.  CN exam unremarkable - pt with facial asymmetry from broken jaw 20 years ago.  She did not pass 3 ounce water test - attempts at sequential consumption resulted in gagging and expectoration after intake of 1.5 ounce.     Pt admitted to sensation of stasis in esophagus, small amounts tolerated well.   Inadequate mastication of peach - pt does not have her dentures here.  Pt coughed and expectorated peach delayed after swallow (within 5  minutes) with minimal secretions - suspect esophageal source.  Pt reports sensation of intake lodging in esophagus and severe reflux symptoms.    Do not suspect oropharyngeal dysphagia despite pt's oral cavity being edematous and erythemic.  Advised pt to follow esophageal precautions given her complaint of reflux - Advised her to consume room-temperature drinks and avoid acidic items until symptoms better managed.    No SLP follow up indicated at this time.  Thanks for this referral.       Of note, pt underwent  esophageal endoscopy August 24, 2016 with findings of esophageal varices,  portal HTN gastropathy, 5 cm HH, duodenal ulcer, esophageal ulcer - please see full report from GI Md with recommendations.  If indicated referral to GI may be helpful.     SLP Visit Diagnosis: Dysphagia, unspecified (R13.10)    Aspiration Risk  Mild aspiration risk    Diet Recommendation Regular;Thin liquid(ground meats, extra gravy/sauces)   Liquid Administration via: Cup;Straw Medication Administration: (as tolerated) Supervision: Patient able to self feed Compensations: Minimize environmental distractions;Slow rate;Small sips/bites Postural Changes: Remain upright for at least 30 minutes after po intake;Seated upright at 90 degrees    Other  Recommendations Oral Care Recommendations: Oral care BID   Follow up Recommendations None      Frequency and Duration     n/a       Prognosis   n/a     Swallow Study   General Date of Onset: 10/20/17 HPI: Pt arrived to ED 2/2 SOB and found to be severely acidotic and agitated. She was placed on BiPAP but was able to transition to [place] yesterday around 7:00 PM. She has hx of cirrhosis without ascites.  Swallow evaluation ordered due to concerns for possible aspiration.  She was given Ativan and then was obtunded.  Pt CXR showed tiny effusion.   Type of Study: Bedside Swallow Evaluation Diet Prior to this Study: Regular;Thin liquids Temperature Spikes Noted: No Respiratory Status: Nasal cannula History of Recent Intubation: No Behavior/Cognition: Alert;Cooperative;Pleasant mood Oral Cavity Assessment: Erythema;Edema(pt with severe edema and erythema of soft palate, posterior oral care) Oral Care Completed by SLP: No Oral Cavity - Dentition: Dentures, not available Vision: Functional for self-feeding Self-Feeding Abilities: Able to feed self Patient Positioning: Upright in bed Baseline Vocal Quality: Hoarse Volitional Cough: Strong Volitional Swallow: Able to  elicit    Oral/Motor/Sensory Function Overall Oral Motor/Sensory Function: (appearance of mild facial asymmetry  pt reports broken jaw 20 years ago, not neuro source)   Ice Chips Ice chips: Not tested   Thin Liquid Thin Liquid: Impaired Presentation: Cup;Self Fed Pharyngeal  Phase Impairments: Cough - Delayed Other Comments: pt did not pass 3 ounce water test - attempts at sequential consumption resulted in gagging and expectoration after intake of 1.5 ounce, pt admitted to sensation of stasis in esophagus, small amounts tolerated well     Nectar Thick Nectar Thick Liquid: Not tested   Honey Thick Honey Thick Liquid: Not tested   Puree Puree: Within functional limits Presentation: Self Fed;Spoon   Solid   GO   Solid: Impaired Presentation: Self Fed Oral Phase Impairments: Impaired mastication Other Comments: inadequate mastication, pt coughed and expectorated peach after swallow with minimal secretions - reports sensation of them lodging in esophagus        Macario Golds 10/20/2017,11:03 AM Luanna Salk, [facility] SLP [phone]

## 2017-10-20 NOTE — Progress Notes (Signed)
RN discussed with MD the possibility of discontinuing foley catheter. Per MD, will keep foley catheter in for at least one more day, depending on lab results (patient may require lasix). Will continue to monitor.

## 2017-10-20 NOTE — Progress Notes (Signed)
PROGRESS NOTE    Ashley Savage  PJK:[id number] DOB: 1941-04-27 DOA: 10/18/2017 PCP: Hennie Duos, MD   Brief Narrative: 77 year old female with history of COPD O2 dependent, hepatic cirrhosis, diabetes mellitus type 2 who presented to the emergency department with altered mental status, and respiratory distress.Upon ED evaluation she was found to be febrile with T-max of 100.6, respiratory distress with hypoxia, elevated lactic acid and somnolent.  Ammonia levels were mildly elevated at 47.  Patient was placed on BiPAP, started on IV fluids and empiric antibiotics.  Patient was admitted with working diagnosis of acute on chronic respiratory failure with hypoxia complicated with hepatic encephalopathy.  Assessment & Plan:   #Acute on chronic respiratory failure with hypoxia and hypercapnia due to acute COPD exacerbation and acute pulmonary edema: -Patient is off BiPAP, currently on oxygen via nasal cannula.  Bilateral expiratory wheeze and crackle on exam.  Repeat chest x-ray today with pulmonary edema.  Continue IV Lasix daily.  Monitor BMP. -Continue IV Solu-Medrol, bronchodilators  #Presumed sepsis on admission: Cultures negative.  On admission patient had low-grade temperature, leukocytosis and elevated lactic acid level.  Also elevated pro-calcitonin level.  Currently on broad-spectrum antibiotics.  MRSA screen negative therefore I will discontinue vancomycin. -Leukocytosis also contributed by steroid.  #Acute metabolic/hepatic encephalopathy: Continue lactulose.  Ammonia level improved.  Treat infection.  Speech and swallow evaluation today.  Mental status improved.  #Essential hypertension: Monitor blood pressure.  #Type 2 diabetes with hyperglycemia in the setting of a steroid: Continue sliding scale.  Monitor blood sugar level.  Add Lantus 15 units.  Check A1c level.  #Hepatic cirrhosis due to [name] continue lactulose, rifaximin, diuretics.  #Thrombocytopenia in the setting of  liver disease.  No sign of bleeding.  Continue to monitor.  #Dyspepsia and GERD: Added MiraLAX and on Protonix daily.  Abdomen exam benign.  Order PT OT evaluation.  DVT prophylaxis: SCD.  Patient has thrombocytopenia Code Status: DNR Family Communication: No family at bedside Disposition Plan: Transfer to telemetry floor, likely discharge home in 2 days.  PT OT evaluation    Consultants:   Pulmonary  Procedures: BiPAP on admission Antimicrobials: Vancomycin discontinued on 3/27.  Continue cefepime  Subjective: Seen and examined at bedside.  Reported shortness of breath is better.  Coughing and reported dyspepsia.  Denies headache, dizziness.  Objective: Vitals:   10/20/17 0346 10/20/17 0600 10/20/17 0700 10/20/17 0737  BP:  (!) 108/45 (!) 115/33   Pulse:      Resp:  (!) 23 (!) 23   Temp: 98.5 F (36.9 C)   98.1 F (36.7 C)  TempSrc: Oral     SpO2:  (!) 87% 97%   Weight: 72.8 kg (160 lb 7.9 oz)     Height:        Intake/Output Summary (Last 24 hours) at 10/20/2017 1114 Last data filed at 10/20/2017 0600 Gross per 24 hour  Intake 310 ml  Output 850 ml  Net -540 ml   Filed Weights   10/19/17 0322 10/20/17 0346  Weight: 70.9 kg (156 lb 4.9 oz) 72.8 kg (160 lb 7.9 oz)    Examination:  General exam: Appears calm and comfortable  Respiratory system: Bilateral diffuse expiratory wheeze and basal crackle.  Respiratory effort normal. Cardiovascular system: S1 & S2 heard, RRR.  Trace pedal edema. Gastrointestinal system: Abdomen is nondistended, soft and nontender. Normal bowel sounds heard. Central nervous system: Alert and oriented. No focal neurological deficits. Extremities: Symmetric 5 x 5 power. Skin: No rashes, lesions  or ulcers Psychiatry: Judgement and insight appear normal. Mood & affect appropriate.     Data Reviewed: I have personally reviewed following labs and imaging studies  CBC: Recent Labs  Lab 10/18/17 0259 10/18/17 1302 10/19/17 0905  10/20/17 0857  WBC 24.5* 24.4* 22.4* 24.8*  NEUTROABS 21.0*  --   --   --   HGB 13.0 11.9* 10.5* 11.0*  HCT 39.7 37.8 31.8* 33.1*  MCV 97.1 101.3* 97.0 97.1  PLT 131* 126* 101* 967*   Basic Metabolic Panel: Recent Labs  Lab 10/15/17 10/18/17 0259 10/18/17 1302 10/19/17 0905 10/20/17 0857  NA 140 140  --  138 133*  K 4.8 4.0  --  4.2 4.3  CL  --  100*  --  104 99*  CO2  --  27  --  26 23  GLUCOSE  --  152*  --  210* 305*  BUN 14 23*  --  50* 59*  CREATININE 0.89 0.93 1.41* 1.34* 1.51*  CALCIUM 8.5 9.0  --  8.6* 9.0  MG  --   --   --  2.4 2.3  PHOS  --   --   --  3.7 3.0   GFR: Estimated Creatinine Clearance: 32.7 mL/min (A) (by C-G formula based on SCr of 1.51 mg/dL (H)). Liver Function Tests: Recent Labs  Lab 10/15/17 10/18/17 0259  AST 28 40  ALT 19 22  ALKPHOS 191 187*  BILITOT 1.2 3.8*  PROT 5.1 5.7*  ALBUMIN 2.2 2.3*   No results for input(s): LIPASE, AMYLASE in the last 168 hours. Recent Labs  Lab 10/18/17 0627 10/18/17 1302 10/19/17 0905  AMMONIA 47* 56* 30   Coagulation Profile: No results for input(s): INR, PROTIME in the last 168 hours. Cardiac Enzymes: No results for input(s): CKTOTAL, CKMB, CKMBINDEX, TROPONINI in the last 168 hours. BNP (last 3 results) Recent Labs    05/26/17 1438 07/14/17 1208  PROBNP 231.0* 265.0*   HbA1C: No results for input(s): HGBA1C in the last 72 hours. CBG: Recent Labs  Lab 10/19/17 1605 10/19/17 1941 10/20/17 0006 10/20/17 0300 10/20/17 0800  GLUCAP 248* 245* 289* 228* 262*   Lipid Profile: No results for input(s): CHOL, HDL, LDLCALC, TRIG, CHOLHDL, LDLDIRECT in the last 72 hours. Thyroid Function Tests: No results for input(s): TSH, T4TOTAL, FREET4, T3FREE, THYROIDAB in the last 72 hours. Anemia Panel: No results for input(s): VITAMINB12, FOLATE, FERRITIN, TIBC, IRON, RETICCTPCT in the last 72 hours. Sepsis Labs: Recent Labs  Lab 10/18/17 0357 10/18/17 0513 10/19/17 0905  PROCALCITON  --   --   7.27  LATICACIDVEN 3.75* 5.73*  --     Recent Results (from the past 240 hour(s))  Blood Culture (routine x 2)     Status: None (Preliminary result)   Collection Time: 10/18/17  4:48 AM  Result Value Ref Range Status   Specimen Description   Final    BLOOD RIGHT WRIST Performed at [facility] [address], [place], [place] [address]    Special Requests   Final    BOTTLES DRAWN AEROBIC AND ANAEROBIC Blood Culture results may not be optimal due to an excessive volume of blood received in culture bottles Performed at [facility] [address] [place], [place] [address]    Culture   Final    NO GROWTH 2 DAYS Performed at [facility] [address] [place], [place] [address]    Report Status PENDING  Incomplete  Blood Culture (routine x  2)     Status: None (Preliminary result)   Collection Time: 10/18/17  4:48 AM  Result Value Ref Range Status   Specimen Description   Final    BLOOD RIGHT HAND Performed at [facility] [address] [place], [place] [address]    Special Requests   Final    BOTTLES DRAWN AEROBIC AND ANAEROBIC Blood Culture adequate volume Performed at [facility] [address] [place], [place] [address]    Culture   Final    NO GROWTH 2 DAYS Performed at [facility] [address], [place], [place] [address]    Report Status PENDING  Incomplete  Respiratory Panel by PCR     Status: None   Collection Time: 10/18/17 12:30 PM  Result Value Ref Range Status   Adenovirus NOT DETECTED NOT DETECTED Final   Coronavirus 229E NOT DETECTED NOT DETECTED Final   Coronavirus HKU1 NOT DETECTED NOT DETECTED Final   Coronavirus NL63 NOT DETECTED NOT DETECTED Final   Coronavirus OC43 NOT DETECTED NOT DETECTED Final   Metapneumovirus NOT DETECTED NOT DETECTED Final   Rhinovirus / Enterovirus NOT DETECTED NOT DETECTED Final   Influenza A NOT DETECTED NOT DETECTED Final    Influenza B NOT DETECTED NOT DETECTED Final   Parainfluenza Virus 1 NOT DETECTED NOT DETECTED Final   Parainfluenza Virus 2 NOT DETECTED NOT DETECTED Final   Parainfluenza Virus 3 NOT DETECTED NOT DETECTED Final   Parainfluenza Virus 4 NOT DETECTED NOT DETECTED Final   Respiratory Syncytial Virus NOT DETECTED NOT DETECTED Final   Bordetella pertussis NOT DETECTED NOT DETECTED Final   Chlamydophila pneumoniae NOT DETECTED NOT DETECTED Final   Mycoplasma pneumoniae NOT DETECTED NOT DETECTED Final  MRSA PCR Screening     Status: None   Collection Time: 10/18/17 12:30 PM  Result Value Ref Range Status   MRSA by PCR NEGATIVE NEGATIVE Final    Comment:        The GeneXpert MRSA Assay (FDA approved for NASAL specimens only), is one component of a comprehensive MRSA colonization surveillance program. It is not intended to diagnose MRSA infection nor to guide or monitor treatment for MRSA infections. Performed at [facility], [place] [address] [place], [place] [address]          Radiology Studies: Dg Chest Port 1 View  Result Date: 10/20/2017 CLINICAL DATA:  Shortness of Breath EXAM: PORTABLE CHEST 1 VIEW COMPARISON:  10/19/2017 FINDINGS: Mild cardiomegaly, vascular congestion and interstitial prominence, similar prior study. Bibasilar atelectasis and suspect small effusions. No real change since prior study. IMPRESSION: Suspect mild interstitial edema/CHF. Bibasilar atelectasis, small effusions. Electronically Signed   By: Rolm Baptise M.D.   On: 10/20/2017 10:15   Dg Chest Port 1 View  Result Date: 10/19/2017 CLINICAL DATA:  Followup respiratory failure. EXAM: PORTABLE CHEST 1 VIEW COMPARISON:  10/18/2017 FINDINGS: The patient is rotated towards the right. Interstitial markings appear more prominent, suggesting fluid overload/early interstitial edema. No consolidation or collapse. Possible tiny effusions. IMPRESSION: Suspicion of fluid overload/early interstitial  edema. Tiny effusions. No consolidation or lobar collapse. This film is rotated towards the right. Electronically Signed   By: [place] Chimes M.D.   On: 10/19/2017 07:44        Scheduled Meds: . arformoterol  15 mcg Nebulization BID  . budesonide (PULMICORT) nebulizer solution  0.5 mg Nebulization BID  . enoxaparin (LOVENOX) injection  40 mg Subcutaneous Q24H  . insulin aspart  0-9  Units Subcutaneous Q4H  . ipratropium-albuterol  3 mL Nebulization TID  . lactulose  30 g Oral Q6H  . methylPREDNISolone (SOLU-MEDROL) injection  40 mg Intravenous Q8H  . pantoprazole (PROTONIX) IV  40 mg Intravenous Q24H  . rifaximin  550 mg Oral BID  . sodium chloride flush  3 mL Intravenous Q12H  . sodium chloride flush  3 mL Intravenous Q12H   Continuous Infusions: . sodium chloride    . ceFEPime (MAXIPIME) IV Stopped (10/20/17 1016)  . vancomycin 750 mg (10/20/17 1034)     LOS: 2 days    Thyra Yinger Tanna Furry, MD [facility] Pager [phone]  If 7PM-7AM, please contact night-coverage [email or web address] Password [facility] 10/20/2017, 11:14 AM

## 2017-10-21 LAB — BASIC METABOLIC PANEL
Anion gap: 9 (ref 5–15)
BUN: 54 mg/dL — AB (ref 6–20)
CALCIUM: 9.2 mg/dL (ref 8.9–10.3)
CHLORIDE: 101 mmol/L (ref 101–111)
CO2: 25 mmol/L (ref 22–32)
Creatinine, Ser: 1.18 mg/dL — ABNORMAL HIGH (ref 0.44–1.00)
GFR calc non Af Amer: 44 mL/min — ABNORMAL LOW (ref >60)
GFR, EST AFRICAN AMERICAN: 51 mL/min — AB (ref >60)
Glucose, Bld: 232 mg/dL — ABNORMAL HIGH (ref 65–99)
Potassium: 4.5 mmol/L (ref 3.5–5.1)
SODIUM: 135 mmol/L (ref 135–145)

## 2017-10-21 LAB — GLUCOSE, CAPILLARY
GLUCOSE-CAPILLARY: 189 mg/dL — AB (ref 65–99)
GLUCOSE-CAPILLARY: 257 mg/dL — AB (ref 65–99)
GLUCOSE-CAPILLARY: 318 mg/dL — AB (ref 65–99)
GLUCOSE-CAPILLARY: 380 mg/dL — AB (ref 65–99)
Glucose-Capillary: 226 mg/dL — ABNORMAL HIGH (ref 65–99)
Glucose-Capillary: 266 mg/dL — ABNORMAL HIGH (ref 65–99)
Glucose-Capillary: 270 mg/dL — ABNORMAL HIGH (ref 65–99)
Glucose-Capillary: 326 mg/dL — ABNORMAL HIGH (ref 65–99)

## 2017-10-21 LAB — CBC
HEMATOCRIT: 32.3 % — AB (ref 36.0–46.0)
Hemoglobin: 10.8 g/dL — ABNORMAL LOW (ref 12.0–15.0)
MCH: 31.8 pg (ref 26.0–34.0)
MCHC: 33.4 g/dL (ref 30.0–36.0)
MCV: 95 fL (ref 78.0–100.0)
Platelets: 96 10*3/uL — ABNORMAL LOW (ref 150–400)
RBC: 3.4 MIL/uL — ABNORMAL LOW (ref 3.87–5.11)
RDW: 15.2 % (ref 11.5–15.5)
WBC: 16.7 10*3/uL — AB (ref 4.0–10.5)

## 2017-10-21 LAB — HEMOGLOBIN A1C
HEMOGLOBIN A1C: 6.1 % — AB (ref 4.8–5.6)
MEAN PLASMA GLUCOSE: 128.37 mg/dL

## 2017-10-21 MED ORDER — FUROSEMIDE 40 MG PO TABS
80.0000 mg | ORAL_TABLET | Freq: Every day | ORAL | Status: DC
  Administered 2017-10-22: 80 mg via ORAL
  Filled 2017-10-21: qty 2

## 2017-10-21 MED ORDER — SPIRONOLACTONE 25 MG PO TABS
25.0000 mg | ORAL_TABLET | Freq: Every day | ORAL | Status: DC
  Administered 2017-10-21 – 2017-10-22 (×2): 25 mg via ORAL
  Filled 2017-10-21 (×2): qty 1

## 2017-10-21 MED ORDER — CEFEPIME HCL 2 G IJ SOLR
2.0000 g | Freq: Two times a day (BID) | INTRAMUSCULAR | Status: DC
  Administered 2017-10-21 – 2017-10-22 (×2): 2 g via INTRAVENOUS
  Filled 2017-10-21 (×3): qty 2

## 2017-10-21 NOTE — Progress Notes (Signed)
Inpatient Diabetes Program Recommendations  AACE/ADA: New Consensus Statement on Inpatient Glycemic Control (2015)  Target Ranges:  Prepandial:   less than 140 mg/dL      Peak postprandial:   less than 180 mg/dL (1-2 hours)      Critically ill patients:  140 - 180 mg/dL   Lab Results  Component Value Date   GLUCAP 189 (H) 10/21/2017   HGBA1C 5.9 (H) 06/26/2017    Review of Glycemic Control  FBS and post-prandials above goal of < 180 mg/dL. HgbA1C indicates good glycemic control PTA.  Inpatient Diabetes Program Recommendations:     Increase Levemir to 18 units QHS Consider increasing Novolog to 0-15 units Q4H while on steroids.  Will continue to follow.  Thank you. Lorenda Peck, RD, LDN, CDE Inpatient Diabetes Coordinator [phone]

## 2017-10-21 NOTE — Evaluation (Signed)
Physical Therapy Evaluation Patient Details Name: Ashley Savage MRN: [id number] DOB: 09/05/1940 Today's Date: 10/21/2017   History of Present Illness  Pt is a 77 y.o. female with a history of 2L O2-dependent COPD, hepatic cirrhosis, DM, and HLD. She presented from SNF for hypoxia and respiratory distress with wheezing that began the previous day and worsened to severe overnight despite multiple breathing treatments.    Clinical Impression  Pt admitted with above diagnosis. Pt currently with functional limitations due to the deficits listed below (see PT Problem List). PTA pt was a ST [facility] at [facility] receiving therapy. On eval, pt required min assist bed mobility and transfers. Mobility limited by cardiopulmonary status, desat to 85% with transfers on 2 L O2.  Pt will benefit from skilled PT to increase their independence and safety with mobility to allow discharge to the venue listed below.       Follow Up Recommendations SNF    Equipment Recommendations  None recommended by PT    Recommendations for Other Services       Precautions / Restrictions Precautions Precautions: Fall;Other (comment) Precaution Comments: watch sats      Mobility  Bed Mobility Overal bed mobility: Needs Assistance Bed Mobility: Supine to Sit     Supine to sit: Min assist     General bed mobility comments: +rail, verbal cues for sequencing  Transfers Overall transfer level: Needs assistance Equipment used: Rolling walker (2 wheeled) Transfers: Sit to/from [facility] Sit to Stand: Min assist Stand pivot transfers: Min assist       General transfer comment: Pt on 1.5 L O2 at rest with SpO2 88%. O2 increased to 2.5 L with sat increase to 90%. Desat to 85% on 2 L during mobility. Improved to 90% after 2-minute rest in chair.   Ambulation/Gait             General Gait Details: unable due to resp status  Stairs            Wheelchair Mobility    Modified  Rankin (Stroke Patients Only)       Balance Overall balance assessment: Needs assistance Sitting-balance support: No upper extremity supported;Feet supported Sitting balance-Leahy Scale: Fair     Standing balance support: Bilateral upper extremity supported;During functional activity Standing balance-Leahy Scale: Poor Standing balance comment: reliant on RW                             Pertinent Vitals/Pain Pain Assessment: No/denies pain    Home Living Family/patient expects to be discharged to:: Skilled nursing facility                      Prior Function Level of Independence: Needs assistance   Gait / Transfers Assistance Needed: primarily uses wheelchair independently in SNF. Working with PT on ambulation with RW.   ADL's / Homemaking Assistance Needed: staff assists with all ADLs        Hand Dominance   Dominant Hand: Right    Extremity/Trunk Assessment   Upper Extremity Assessment Upper Extremity Assessment: Overall WFL for tasks assessed    Lower Extremity Assessment Lower Extremity Assessment: Generalized weakness    Cervical / Trunk Assessment Cervical / Trunk Assessment: Kyphotic  Communication   Communication: No difficulties  Cognition Arousal/Alertness: Awake/alert Behavior During Therapy: WFL for tasks assessed/performed Overall Cognitive Status: No family/caregiver present to determine baseline cognitive functioning  General Comments: A&O x 4. Labile       General Comments      Exercises General Exercises - Lower Extremity Ankle Circles/Pumps: AROM;20 reps;Both   Assessment/Plan    PT Assessment Patient needs continued PT services  PT Problem List Decreased strength;Decreased mobility;Decreased activity tolerance;Cardiopulmonary status limiting activity;Decreased balance       PT Treatment Interventions DME instruction;Therapeutic activities;Gait training;Therapeutic  exercise;Patient/family education;Balance training;Functional mobility training    PT Goals (Current goals can be found in the Care Plan section)  Acute Rehab PT Goals Patient Stated Goal: return to [name] PT Goal Formulation: With patient Time For Goal Achievement: 11/04/17 Potential to Achieve Goals: Good    Frequency Min 2X/week   Barriers to discharge        Co-evaluation               AM-PAC PT "6 Clicks" Daily Activity  Outcome Measure Difficulty turning over in bed (including adjusting bedclothes, sheets and blankets)?: A Little Difficulty moving from lying on back to sitting on the side of the bed? : A Lot Difficulty sitting down on and standing up from a chair with arms (e.g., wheelchair, bedside commode, etc,.)?: Unable Help needed moving to and from a bed to chair (including a wheelchair)?: A Little Help needed walking in hospital room?: A Little Help needed climbing 3-5 steps with a railing? : A Lot 6 Click Score: 14    End of Session Equipment Utilized During Treatment: Oxygen;Gait belt Activity Tolerance: Treatment limited secondary to medical complications (Comment)(desat) Patient left: in chair;with chair alarm set;with call bell/phone within reach Nurse Communication: Mobility status PT Visit Diagnosis: Other abnormalities of gait and mobility (R26.89);Difficulty in walking, not elsewhere classified (R26.2)    Time: 2831-5176 PT Time Calculation (min) (ACUTE ONLY): 18 min   Charges:   PT Evaluation $PT Eval Moderate Complexity: 1 Mod     PT G Codes:        Lorrin Goodell, PT  Office # [phone] Pager [phone]   Lorriane Shire 10/21/2017, 11:02 AM

## 2017-10-21 NOTE — Progress Notes (Signed)
OT Cancellation Note  Patient Details Name: ALAYSIA LIGHTLE MRN: [id number] DOB: 1941/03/28   Cancelled Treatment:    Reason Eval/Treat Not Completed: Other (comment)  Noted pt from SNF- will defer OT needs back to SNF [facility], [place]  Payton Mccallum D 10/21/2017, 9:26 AM

## 2017-10-21 NOTE — Progress Notes (Signed)
Removed IFC. Tolerated well. Will monitor for void

## 2017-10-21 NOTE — Progress Notes (Signed)
Pt has yet to void. Bladder scanned 23ml urine. MD updated. Repostioned pt. Up to bedside commode. Still cannot void.

## 2017-10-21 NOTE — Progress Notes (Signed)
PROGRESS NOTE    Ashley Savage  DZH:[id number] DOB: 24-Jun-1941 DOA: 10/18/2017 PCP: Hennie Duos, MD   Brief Narrative: 77 year old female with history of COPD O2 dependent, hepatic cirrhosis, diabetes mellitus type 2 who presented to the emergency department with altered mental status, and respiratory distress.Upon ED evaluation she was found to be febrile with T-max of 100.6, respiratory distress with hypoxia, elevated lactic acid and somnolent.  Ammonia levels were mildly elevated at 47.  Patient was placed on BiPAP, started on IV fluids and empiric antibiotics.  Patient was admitted with working diagnosis of acute on chronic respiratory failure with hypoxia complicated with hepatic encephalopathy.  Assessment & Plan:   #Acute on chronic respiratory failure with hypoxia and hypercapnia due to acute COPD exacerbation and acute pulmonary edema: -Patient is off BiPAP, currently on oxygen via nasal cannula.  Started on IV Lasix.  Improvement in shortness of breath.  Discontinue Solu-Medrol.  Change IV Lasix to oral Lasix.  Resume Aldactone.  Try to wean down oxygen.  Remove Foley catheter.  Discussed with the nurse.  #Presumed sepsis on admission: Cultures negative.  On admission patient had low-grade temperature, leukocytosis and elevated lactic acid level.  Also elevated pro-calcitonin level.  Currently on cefepime, likely switch to oral antibiotics tomorrow. -Leukocytosis also contributed by steroid.  #Acute metabolic/hepatic encephalopathy: Continue lactulose.  Ammonia level improved.  Treat infection.  Speech and swallow evaluation today.  -Mental status improved, likely around baseline  #Essential hypertension: Monitor blood pressure.  #Type 2 diabetes with hyperglycemia in the setting of a steroid: Continue sliding scale.  Monitor blood sugar level.  On Lantus 15 units.  May be able to lower down insulin since patient will be off steroid.  Check A1c level.  #Hepatic cirrhosis due  to [name] continue lactulose, rifaximin, diuretics.  #Thrombocytopenia in the setting of liver disease.  No sign of bleeding.  Continue to monitor.  #Dyspepsia and GERD: Added MiraLAX and on Protonix daily.  Abdomen exam benign.  #Acute kidney injury improved.  Order PT OT evaluation.  I recommended palliative care evaluation  at SNF.  DVT prophylaxis: SCD.  Patient has thrombocytopenia Code Status: DNR Family Communication: No family at bedside Disposition Plan: Likely discharge to SNF tomorrow.   Consultants:   Pulmonary  Procedures: BiPAP on admission Antimicrobials: Vancomycin discontinued on 3/27.  Continue cefepime  Subjective: Seen and examined at bedside.  No new event.  Denies headache, dizziness, nausea vomiting chest pain shortness of breath.  Objective: Vitals:   10/20/17 2144 10/21/17 0400 10/21/17 0625 10/21/17 0810  BP: (!) 117/50 120/60    Pulse: (!) 107 100    Resp: 18 18    Temp: 98.5 F (36.9 C) 98.7 F (37.1 C)    TempSrc: Oral Oral    SpO2: 93% 96%  92%  Weight:   75.7 kg (166 lb 14.2 oz)   Height:        Intake/Output Summary (Last 24 hours) at 10/21/2017 1315 Last data filed at 10/21/2017 1300 Gross per 24 hour  Intake 480 ml  Output 900 ml  Net -420 ml   Filed Weights   10/19/17 0322 10/20/17 0346 10/21/17 0625  Weight: 70.9 kg (156 lb 4.9 oz) 72.8 kg (160 lb 7.9 oz) 75.7 kg (166 lb 14.2 oz)    Examination:  General exam: Not in distress sitting on chair comfortable Respiratory system: Bibasal decreased breath sound, respiratory effort normal, no wheezing cardiovascular system: Regular rate rhythm S1-S2 normal.  Trace lower extremity  edema. Gastrointestinal system: Abdomen is nondistended, soft and nontender. Normal bowel sounds heard. Central nervous system: Alert awake and following commands Extremities: Symmetric 5 x 5 power. Skin: No rashes, lesions or ulcers Psychiatry: Judgement and insight appear normal    Data Reviewed: I  have personally reviewed following labs and imaging studies  CBC: Recent Labs  Lab 10/18/17 0259 10/18/17 1302 10/19/17 0905 10/20/17 0857 10/21/17 0500  WBC 24.5* 24.4* 22.4* 24.8* 16.7*  NEUTROABS 21.0*  --   --   --   --   HGB 13.0 11.9* 10.5* 11.0* 10.8*  HCT 39.7 37.8 31.8* 33.1* 32.3*  MCV 97.1 101.3* 97.0 97.1 95.0  PLT 131* 126* 101* 128* 96*   Basic Metabolic Panel: Recent Labs  Lab 10/15/17 10/18/17 0259 10/18/17 1302 10/19/17 0905 10/20/17 0857 10/21/17 0500  NA 140 140  --  138 133* 135  K 4.8 4.0  --  4.2 4.3 4.5  CL  --  100*  --  104 99* 101  CO2  --  27  --  26 23 25   GLUCOSE  --  152*  --  210* 305* 232*  BUN 14 23*  --  50* 59* 54*  CREATININE 0.89 0.93 1.41* 1.34* 1.51* 1.18*  CALCIUM 8.5 9.0  --  8.6* 9.0 9.2  MG  --   --   --  2.4 2.3  --   PHOS  --   --   --  3.7 3.0  --    GFR: Estimated Creatinine Clearance: 42.6 mL/min (A) (by C-G formula based on SCr of 1.18 mg/dL (H)). Liver Function Tests: Recent Labs  Lab 10/15/17 10/18/17 0259  AST 28 40  ALT 19 22  ALKPHOS 191 187*  BILITOT 1.2 3.8*  PROT 5.1 5.7*  ALBUMIN 2.2 2.3*   No results for input(s): LIPASE, AMYLASE in the last 168 hours. Recent Labs  Lab 10/18/17 0627 10/18/17 1302 10/19/17 0905  AMMONIA 47* 56* 30   Coagulation Profile: No results for input(s): INR, PROTIME in the last 168 hours. Cardiac Enzymes: No results for input(s): CKTOTAL, CKMB, CKMBINDEX, TROPONINI in the last 168 hours. BNP (last 3 results) Recent Labs    05/26/17 1438 07/14/17 1208  PROBNP 231.0* 265.0*   HbA1C: No results for input(s): HGBA1C in the last 72 hours. CBG: Recent Labs  Lab 10/20/17 1959 10/21/17 0014 10/21/17 0409 10/21/17 0728 10/21/17 1205  GLUCAP 348* 270* 226* 189* 257*   Lipid Profile: No results for input(s): CHOL, HDL, LDLCALC, TRIG, CHOLHDL, LDLDIRECT in the last 72 hours. Thyroid Function Tests: No results for input(s): TSH, T4TOTAL, FREET4, T3FREE, THYROIDAB in  the last 72 hours. Anemia Panel: No results for input(s): VITAMINB12, FOLATE, FERRITIN, TIBC, IRON, RETICCTPCT in the last 72 hours. Sepsis Labs: Recent Labs  Lab 10/18/17 0357 10/18/17 0513 10/19/17 0905  PROCALCITON  --   --  7.27  LATICACIDVEN 3.75* 5.73*  --     Recent Results (from the past 240 hour(s))  Blood Culture (routine x 2)     Status: None (Preliminary result)   Collection Time: 10/18/17  4:48 AM  Result Value Ref Range Status   Specimen Description   Final    BLOOD RIGHT WRIST Performed at [facility] [address] [place], [place] [address]    Special Requests   Final    BOTTLES DRAWN AEROBIC AND ANAEROBIC Blood Culture results may not be optimal due to an excessive volume of blood received in culture bottles Performed  at [facility], [place] [address] [place], [place] [address]    Culture   Final    NO GROWTH 3 DAYS Performed at [facility], [place] [address] [place], [place] [address]    Report Status PENDING  Incomplete  Blood Culture (routine x 2)     Status: None (Preliminary result)   Collection Time: 10/18/17  4:48 AM  Result Value Ref Range Status   Specimen Description   Final    BLOOD RIGHT HAND Performed at [facility] [address] [place], [place] [address]    Special Requests   Final    BOTTLES DRAWN AEROBIC AND ANAEROBIC Blood Culture adequate volume Performed at [facility] [address] [place], [place] [address]    Culture   Final    NO GROWTH 3 DAYS Performed at [facility], [place] [address], [place], [place] [address]    Report Status PENDING  Incomplete  Respiratory Panel by PCR     Status: None   Collection Time: 10/18/17 12:30 PM  Result Value Ref Range Status   Adenovirus NOT DETECTED NOT DETECTED Final   Coronavirus 229E NOT DETECTED NOT DETECTED Final   Coronavirus HKU1 NOT DETECTED NOT DETECTED Final   Coronavirus NL63 NOT  DETECTED NOT DETECTED Final   Coronavirus OC43 NOT DETECTED NOT DETECTED Final   Metapneumovirus NOT DETECTED NOT DETECTED Final   Rhinovirus / Enterovirus NOT DETECTED NOT DETECTED Final   Influenza A NOT DETECTED NOT DETECTED Final   Influenza B NOT DETECTED NOT DETECTED Final   Parainfluenza Virus 1 NOT DETECTED NOT DETECTED Final   Parainfluenza Virus 2 NOT DETECTED NOT DETECTED Final   Parainfluenza Virus 3 NOT DETECTED NOT DETECTED Final   Parainfluenza Virus 4 NOT DETECTED NOT DETECTED Final   Respiratory Syncytial Virus NOT DETECTED NOT DETECTED Final   Bordetella pertussis NOT DETECTED NOT DETECTED Final   Chlamydophila pneumoniae NOT DETECTED NOT DETECTED Final   Mycoplasma pneumoniae NOT DETECTED NOT DETECTED Final  MRSA PCR Screening     Status: None   Collection Time: 10/18/17 12:30 PM  Result Value Ref Range Status   MRSA by PCR NEGATIVE NEGATIVE Final    Comment:        The GeneXpert MRSA Assay (FDA approved for NASAL specimens only), is one component of a comprehensive MRSA colonization surveillance program. It is not intended to diagnose MRSA infection nor to guide or monitor treatment for MRSA infections. Performed at [facility], [place] [address] [place], [place] [address]          Radiology Studies: Dg Chest Port 1 View  Result Date: 10/20/2017 CLINICAL DATA:  Shortness of Breath EXAM: PORTABLE CHEST 1 VIEW COMPARISON:  10/19/2017 FINDINGS: Mild cardiomegaly, vascular congestion and interstitial prominence, similar prior study. Bibasilar atelectasis and suspect small effusions. No real change since prior study. IMPRESSION: Suspect mild interstitial edema/CHF. Bibasilar atelectasis, small effusions. Electronically Signed   By: Rolm Baptise M.D.   On: 10/20/2017 10:15        Scheduled Meds: . arformoterol  15 mcg Nebulization BID  . budesonide (PULMICORT) nebulizer solution  0.5 mg Nebulization BID  . enoxaparin (LOVENOX) injection   40 mg Subcutaneous Q24H  . [START ON 10/22/2017] furosemide  80 mg Oral Daily  . insulin aspart  0-9 Units Subcutaneous Q4H  . insulin glargine  15 Units Subcutaneous Daily  . ipratropium-albuterol  3 mL Nebulization TID  .  lactulose  30 g Oral Q6H  . mouth rinse  15 mL Mouth Rinse BID  . methylPREDNISolone (SOLU-MEDROL) injection  40 mg Intravenous Q8H  . pantoprazole (PROTONIX) IV  40 mg Intravenous Q24H  . rifaximin  550 mg Oral BID  . sodium chloride flush  3 mL Intravenous Q12H  . sodium chloride flush  3 mL Intravenous Q12H  . spironolactone  25 mg Oral Daily   Continuous Infusions: . sodium chloride    . ceFEPime (MAXIPIME) IV       LOS: 3 days    Shloima Clinch Tanna Furry, MD [facility] Pager [phone]  If 7PM-7AM, please contact night-coverage [email or web address] Password [email or web address] 10/21/2017, 1:15 PM

## 2017-10-21 NOTE — Progress Notes (Signed)
Pharmacy Antibiotic Note  Ashley Savage is a 77 y.o. female admitted on 10/18/2017 with sepsis.  Pharmacy has been consulted for cefepime dosing.  Today, 10/21/2017 Day #4 antibiotics Afebrile WBC elevated but improved to 16.7 Scr decreased to 1.18, est CrCl ~44ml/min  Plan: Cefepime 2g IV q12h Follow up renal function & cultures  Height: 5\' 5"  (165.1 cm) Weight: 166 lb 14.2 oz (75.7 kg) IBW/kg (Calculated) : 57  Temp (24hrs), Avg:98.3 F (36.8 C), Min:97.6 F (36.4 C), Max:98.7 F (37.1 C)  Recent Labs  Lab 10/18/17 0259 10/18/17 0357 10/18/17 0513 10/18/17 1302 10/19/17 0905 10/20/17 0857 10/21/17 0500  WBC 24.5*  --   --  24.4* 22.4* 24.8* 16.7*  CREATININE 0.93  --   --  1.41* 1.34* 1.51* 1.18*  LATICACIDVEN  --  3.75* 5.73*  --   --   --   --     Estimated Creatinine Clearance: 42.6 mL/min (A) (by C-G formula based on SCr of 1.18 mg/dL (H)).    Allergies  Allergen Reactions  . Citalopram Palpitations    Irregular heart beat Irregular heart beat  . Ciprofloxacin Other (See Comments)    Mental status change Mental status change  . Erythromycin Other (See Comments)    Stomach cramps Stomach cramps  . Glimepiride Other (See Comments)    Elevated ammonia levels Elevated ammonia levels  . Prednisone     Increased blood sugars too high    Antimicrobials this admission: 3/25 cefepime >>  3/25 vancomycin >> 3/27 3/25 tamiflu >> 3/26  Dose adjustments this admission: 3/25: Decrease Cefepime to 2gm IV q24h, Vanc 750mg  IV q24h, Tamiflu 30mg  PO BID 3/28 Increase cefepime to q12h for improved renal fxn.  Microbiology results: 3/25 BCx: ngtd 3/25 Respiratory panel: none detected 3/25 MRSA PCR: negative  Thank you for allowing pharmacy to be a part of this patient's care.  Gretta Arab PharmD, BCPS Pager [phone] 10/21/2017 11:29 AM

## 2017-10-21 NOTE — Care Management Note (Signed)
Case Management Note  Patient Details  Name: Ashley Savage MRN: [id number] Date of Birth: [birth year]  Subjective/Objective: DNR. Per [facility] they had started having [place] conversations @ home. MD-If agree please order palliative cons for [facility].From [employer]. CSW following.                    Action/Plan:d/c plan SNF.   Expected Discharge Date:  (UNKNOWN)               Expected Discharge Plan:  Skilled Nursing Facility  In-House Referral:  Clinical Social Work  Discharge planning Services  CM Consult  Post Acute Care Choice:    Choice offered to:     DME Arranged:    DME Agency:     HH Arranged:    [place] Agency:     Status of Service:  In process, will continue to follow  If discussed at Long Length of Stay Meetings, dates discussed:    Additional Comments:  Dessa Phi, RN 10/21/2017, 12:09 PM

## 2017-10-21 NOTE — Progress Notes (Signed)
MD called back. Will continue to monitor for void

## 2017-10-21 NOTE — Consult Note (Signed)
   [facility] Inpatient Consult   10/21/2017  Ashley Savage 1941-04-03 [id number]   [facility] Care Management follow up.   Spoke with inpatient RNCM to make aware that [name] indicated patient had palliative follow up while at [facility]. Discussed that [name] could benefit from goals of care meeting while hospitalized.  Spoke with Ashley Savage at bedside who endorses her discharge plan is to return to [facility]. She also confirms palliative saw her while at [facility]. States she was given paperwork for palliative services and states she is interested.   Will continue to follow and update [facility] LCSW.    [name], MSN-Ed, RN,BSN [facility] Liaison [phone]

## 2017-10-21 NOTE — Care Management Important Message (Signed)
Important Message  Patient Details  Name: MYRAKLE WINGLER MRN: [id number] Date of Birth: Aug 06, 1940   Medicare Important Message Given:  Yes    Kerin Salen 10/21/2017, 11:41 AMImportant Message  Patient Details  Name: SYBILLA MALHOTRA MRN: [id number] Date of Birth: 1941/01/30   Medicare Important Message Given:  Yes    Kerin Salen 10/21/2017, 11:41 AM

## 2017-10-22 DIAGNOSIS — J810 Acute pulmonary edema: Secondary | ICD-10-CM | POA: Diagnosis not present

## 2017-10-22 DIAGNOSIS — R531 Weakness: Secondary | ICD-10-CM | POA: Diagnosis not present

## 2017-10-22 DIAGNOSIS — I714 Abdominal aortic aneurysm, without rupture: Secondary | ICD-10-CM | POA: Diagnosis not present

## 2017-10-22 DIAGNOSIS — K219 Gastro-esophageal reflux disease without esophagitis: Secondary | ICD-10-CM | POA: Diagnosis not present

## 2017-10-22 DIAGNOSIS — F329 Major depressive disorder, single episode, unspecified: Secondary | ICD-10-CM | POA: Diagnosis not present

## 2017-10-22 DIAGNOSIS — M1710 Unilateral primary osteoarthritis, unspecified knee: Secondary | ICD-10-CM | POA: Diagnosis not present

## 2017-10-22 DIAGNOSIS — I10 Essential (primary) hypertension: Secondary | ICD-10-CM | POA: Diagnosis not present

## 2017-10-22 DIAGNOSIS — E1165 Type 2 diabetes mellitus with hyperglycemia: Secondary | ICD-10-CM | POA: Diagnosis not present

## 2017-10-22 DIAGNOSIS — Z66 Do not resuscitate: Secondary | ICD-10-CM | POA: Diagnosis not present

## 2017-10-22 DIAGNOSIS — K7210 Chronic hepatic failure without coma: Secondary | ICD-10-CM | POA: Diagnosis not present

## 2017-10-22 DIAGNOSIS — J449 Chronic obstructive pulmonary disease, unspecified: Secondary | ICD-10-CM | POA: Diagnosis not present

## 2017-10-22 DIAGNOSIS — J9601 Acute respiratory failure with hypoxia: Secondary | ICD-10-CM | POA: Diagnosis not present

## 2017-10-22 DIAGNOSIS — I6789 Other cerebrovascular disease: Secondary | ICD-10-CM | POA: Diagnosis not present

## 2017-10-22 DIAGNOSIS — J441 Chronic obstructive pulmonary disease with (acute) exacerbation: Secondary | ICD-10-CM | POA: Diagnosis not present

## 2017-10-22 DIAGNOSIS — D696 Thrombocytopenia, unspecified: Secondary | ICD-10-CM | POA: Diagnosis not present

## 2017-10-22 DIAGNOSIS — R4182 Altered mental status, unspecified: Secondary | ICD-10-CM | POA: Diagnosis not present

## 2017-10-22 DIAGNOSIS — Z9181 History of falling: Secondary | ICD-10-CM | POA: Diagnosis not present

## 2017-10-22 DIAGNOSIS — F419 Anxiety disorder, unspecified: Secondary | ICD-10-CM | POA: Diagnosis not present

## 2017-10-22 DIAGNOSIS — R8290 Unspecified abnormal findings in urine: Secondary | ICD-10-CM | POA: Diagnosis not present

## 2017-10-22 DIAGNOSIS — R0600 Dyspnea, unspecified: Secondary | ICD-10-CM | POA: Diagnosis not present

## 2017-10-22 DIAGNOSIS — E782 Mixed hyperlipidemia: Secondary | ICD-10-CM | POA: Diagnosis not present

## 2017-10-22 DIAGNOSIS — J9611 Chronic respiratory failure with hypoxia: Secondary | ICD-10-CM | POA: Diagnosis not present

## 2017-10-22 DIAGNOSIS — A419 Sepsis, unspecified organism: Secondary | ICD-10-CM | POA: Diagnosis not present

## 2017-10-22 DIAGNOSIS — Z853 Personal history of malignant neoplasm of breast: Secondary | ICD-10-CM | POA: Diagnosis not present

## 2017-10-22 DIAGNOSIS — N390 Urinary tract infection, site not specified: Secondary | ICD-10-CM | POA: Diagnosis not present

## 2017-10-22 DIAGNOSIS — K3189 Other diseases of stomach and duodenum: Secondary | ICD-10-CM | POA: Diagnosis not present

## 2017-10-22 DIAGNOSIS — R404 Transient alteration of awareness: Secondary | ICD-10-CM | POA: Diagnosis not present

## 2017-10-22 DIAGNOSIS — R488 Other symbolic dysfunctions: Secondary | ICD-10-CM | POA: Diagnosis not present

## 2017-10-22 DIAGNOSIS — J9600 Acute respiratory failure, unspecified whether with hypoxia or hypercapnia: Secondary | ICD-10-CM | POA: Diagnosis not present

## 2017-10-22 DIAGNOSIS — K7581 Nonalcoholic steatohepatitis (NASH): Secondary | ICD-10-CM | POA: Diagnosis not present

## 2017-10-22 DIAGNOSIS — E1140 Type 2 diabetes mellitus with diabetic neuropathy, unspecified: Secondary | ICD-10-CM | POA: Diagnosis not present

## 2017-10-22 DIAGNOSIS — Z833 Family history of diabetes mellitus: Secondary | ICD-10-CM | POA: Diagnosis not present

## 2017-10-22 DIAGNOSIS — K766 Portal hypertension: Secondary | ICD-10-CM | POA: Diagnosis not present

## 2017-10-22 DIAGNOSIS — E1142 Type 2 diabetes mellitus with diabetic polyneuropathy: Secondary | ICD-10-CM | POA: Diagnosis not present

## 2017-10-22 DIAGNOSIS — J9621 Acute and chronic respiratory failure with hypoxia: Secondary | ICD-10-CM | POA: Diagnosis not present

## 2017-10-22 DIAGNOSIS — M6281 Muscle weakness (generalized): Secondary | ICD-10-CM | POA: Diagnosis not present

## 2017-10-22 DIAGNOSIS — J9602 Acute respiratory failure with hypercapnia: Secondary | ICD-10-CM | POA: Diagnosis not present

## 2017-10-22 DIAGNOSIS — R29810 Facial weakness: Secondary | ICD-10-CM | POA: Diagnosis not present

## 2017-10-22 DIAGNOSIS — G9341 Metabolic encephalopathy: Secondary | ICD-10-CM | POA: Diagnosis not present

## 2017-10-22 DIAGNOSIS — K7290 Hepatic failure, unspecified without coma: Secondary | ICD-10-CM | POA: Diagnosis not present

## 2017-10-22 DIAGNOSIS — Z9114 Patient's other noncompliance with medication regimen: Secondary | ICD-10-CM | POA: Diagnosis not present

## 2017-10-22 DIAGNOSIS — Z825 Family history of asthma and other chronic lower respiratory diseases: Secondary | ICD-10-CM | POA: Diagnosis not present

## 2017-10-22 DIAGNOSIS — E785 Hyperlipidemia, unspecified: Secondary | ICD-10-CM | POA: Diagnosis not present

## 2017-10-22 DIAGNOSIS — Z794 Long term (current) use of insulin: Secondary | ICD-10-CM | POA: Diagnosis not present

## 2017-10-22 DIAGNOSIS — J690 Pneumonitis due to inhalation of food and vomit: Secondary | ICD-10-CM | POA: Diagnosis not present

## 2017-10-22 DIAGNOSIS — J181 Lobar pneumonia, unspecified organism: Secondary | ICD-10-CM | POA: Diagnosis not present

## 2017-10-22 DIAGNOSIS — K7460 Unspecified cirrhosis of liver: Secondary | ICD-10-CM | POA: Diagnosis not present

## 2017-10-22 DIAGNOSIS — N179 Acute kidney failure, unspecified: Secondary | ICD-10-CM | POA: Diagnosis not present

## 2017-10-22 LAB — GLUCOSE, CAPILLARY
GLUCOSE-CAPILLARY: 229 mg/dL — AB (ref 65–99)
GLUCOSE-CAPILLARY: 184 mg/dL — AB (ref 65–99)
Glucose-Capillary: 164 mg/dL — ABNORMAL HIGH (ref 65–99)
Glucose-Capillary: 194 mg/dL — ABNORMAL HIGH (ref 65–99)

## 2017-10-22 LAB — CBC
HCT: 32.9 % — ABNORMAL LOW (ref 36.0–46.0)
Hemoglobin: 10.9 g/dL — ABNORMAL LOW (ref 12.0–15.0)
MCH: 31.4 pg (ref 26.0–34.0)
MCHC: 33.1 g/dL (ref 30.0–36.0)
MCV: 94.8 fL (ref 78.0–100.0)
PLATELETS: 91 10*3/uL — AB (ref 150–400)
RBC: 3.47 MIL/uL — AB (ref 3.87–5.11)
RDW: 15.5 % (ref 11.5–15.5)
WBC: 14.5 10*3/uL — AB (ref 4.0–10.5)

## 2017-10-22 MED ORDER — PREDNISONE 20 MG PO TABS
40.0000 mg | ORAL_TABLET | Freq: Every day | ORAL | Status: DC
  Administered 2017-10-22: 40 mg via ORAL
  Filled 2017-10-22: qty 2

## 2017-10-22 MED ORDER — AMOXICILLIN-POT CLAVULANATE 500-125 MG PO TABS: 1.0000 | ORAL_TABLET | Freq: Two times a day (BID) | ORAL | 0 refills | Status: AC

## 2017-10-22 MED ORDER — DICLOFENAC SODIUM 1 % TD GEL
2.0000 g | Freq: Four times a day (QID) | TRANSDERMAL | Status: DC
  Filled 2017-10-22: qty 100

## 2017-10-22 MED ORDER — PREDNISONE 10 MG PO TABS: 10.0000 mg | ORAL_TABLET | Freq: Every day | ORAL | 0 refills | Status: DC

## 2017-10-22 MED ORDER — BENZOCAINE-MENTHOL 20-0.5 % EX AERO: 1.0000 "application " | INHALATION_SPRAY | Freq: Two times a day (BID) | CUTANEOUS | Status: DC | PRN

## 2017-10-22 MED ORDER — LORAZEPAM 0.5 MG PO TABS: 0.5000 mg | ORAL_TABLET | Freq: Four times a day (QID) | ORAL | 0 refills | Status: DC | PRN

## 2017-10-22 MED ORDER — INSULIN DETEMIR 100 UNIT/ML ~~LOC~~ SOLN: 12.0000 [IU] | Freq: Two times a day (BID) | SUBCUTANEOUS | 0 refills | Status: DC

## 2017-10-22 MED ORDER — LACTULOSE 10 GM/15ML PO SOLN: 30.0000 g | Freq: Four times a day (QID) | ORAL | 0 refills | Status: DC

## 2017-10-22 NOTE — Progress Notes (Addendum)
CSW contacted [employer] to follow up about patient's insurance authorization, staff member Tammy reported that patient's authorization has been sent to the medical director and that she doesn't anticipate an answer today. Staff reported that there should be an answer by tomorrow.   CSW updated [facility], admissions staff member Lexine Baton reported that they are able to accept patient back under Medicaid pending and can follow up with insurance company.  CSW updated patient, patient agreeable.  PTAR contacted, patient aware and declined for family to be notified. Patient's RN can call report to [phone], packet complete. CSW signing off, no other needs identified.   4:54 PM CSW contacted by [facility] staff member Manuela Schwartz and informed that patient received insurance authorization. Auth # [id number] starting 10/22/2017. CSW updated SNF.   Abundio Miu, [place] Social Worker [facility] Cell#: [phone]

## 2017-10-22 NOTE — Progress Notes (Signed)
Patient discharged to [facility] via [place], Patient alert and in no pain/distress. No pressure ulcer noted.

## 2017-10-22 NOTE — NC FL2 (Signed)
[place] LEVEL OF CARE SCREENING TOOL     IDENTIFICATION  Patient Name: Ashley Savage Birthdate: [birth year] Sex: female Admission Date (Current Location): 10/18/2017  [facility] and [place] Number:  Herbalist and Address:  [facility],  [place] [address], [place]      Provider Number: [id number]  Attending Physician Name and Address:  Rosita Fire, MD  Relative Name and Phone Number:       Current Level of Care: Hospital Recommended Level of Care: [place] Prior Approval Number:    Date Approved/Denied:   PASRR Number: [id number]  Discharge Plan: SNF    Current Diagnoses: Patient Active Problem List   Diagnosis Date Noted  . Sepsis ([place])   . Acute on chronic respiratory failure with hypoxia ([place]) 10/18/2017  . Leg edema, left 10/02/2017  . Arthritis 10/02/2017  . COPD exacerbation ([place]) 06/25/2017  . Ventricular tachycardia ([place]) 05/22/2017  . Acute kidney injury ([place]) 05/17/2017  . Hyperkalemia 05/17/2017  . Vitamin D deficiency 05/17/2017  . COPD with acute exacerbation ([place]) 05/05/2017  . Urine frequency 04/13/2017  . Osteopenia 03/30/2017  . Dermatitis 03/30/2017  . Allergic state 11/10/2016  . 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 Portal hypertensive gastropathy ([place])   . Lower back injury, initial encounter 08/05/2016  . Fall 07/30/2016  . Preventative health care 03/08/2016  . Muscle spasm 02/25/2016  . Chronic respiratory failure ([place]) 08/29/2015  . NASH (nonalcoholic steatohepatitis) 08/26/2015  . Type 2 diabetes mellitus with hyperglycemia, with [place]-term current use of insulin ([place])   . Cirrhosis of liver without ascites ([place]) 07/31/2015  . Acute respiratory failure with hypoxia ([place]) 07/31/2015  . Diarrhea 12/30/2014  . Increased ammonia level 11/25/2014  . Diabetes mellitus type 2, controlled ([place]) 10/16/2014  . Encephalopathy, hepatic ([place]) 06/07/2014  . Right knee pain  06/07/2014  . Sun-damaged skin 02/01/2014  . Anxiety and depression 02/01/2014  . Tobacco abuse 02/01/2014  . Medicare annual wellness visit, subsequent 02/01/2014  . Pedal edema 12/10/2013  . Overactive bladder 12/10/2013  . Abdominal aortic aneurysm ([place]) 10/01/2013  . Arthritis of right knee 10/01/2013  . Benign paroxysmal positional vertigo 10/01/2013  . Neck pain 10/01/2013  . Esophageal reflux 10/01/2013  . Thrombocytopenia ([place]) 03/17/2012  . 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   . Hyperlipidemia, mixed   . Breast cancer ([place])     Orientation RESPIRATION BLADDER Height & Weight     Self, Time, Situation, Place  O2 Continent Weight: 166 lb 14.2 oz (75.7 kg) Height:  5\' 5"  (165.1 cm)  BEHAVIORAL SYMPTOMS/MOOD NEUROLOGICAL BOWEL NUTRITION STATUS      Continent Diet(see dc summary)  AMBULATORY STATUS COMMUNICATION OF NEEDS Skin   Extensive Assist Verbally Normal                       Personal Care Assistance Level of Assistance  Bathing, Feeding, Dressing Bathing Assistance: Maximum assistance Feeding assistance: Independent Dressing Assistance: Maximum assistance     Functional Limitations Info  Sight, Hearing, Speech Sight Info: Adequate Hearing Info: Adequate Speech Info: Adequate    SPECIAL CARE FACTORS FREQUENCY  PT (By licensed PT), OT (By licensed OT)     PT Frequency: 5x/week OT Frequency: 5x/week            Contractures Contractures Info: Not present    Additional Factors Info  Code Status, Allergies, Insulin Sliding Scale Code Status Info:  DNR Allergies Info: Ciprofloxacin;Glimepiride;Prednisone;Citalopram;Erythromycin   Insulin Sliding Scale Info: insulin aspart (NOVOLOG) 100 UNIT/ML FlexPen Inject 0-15 Units into the skin 3 (three) times daily with meals. CBG ac & hs with SSI 70 -120= 0 units ,121-150=2units,151-200=3units,201-250=5units,251-300=8 units,301-350=11units,351-400=15units greater than 400  call MD and give 15units;         Current Medications (10/22/2017):  This is the current hospital active medication list Current Facility-Administered Medications  Medication Dose Route Frequency Provider Last Rate Last Dose  . 0.9 %  sodium chloride infusion  250 mL Intravenous PRN Patrecia Pour, Christean Grief, MD      . albuterol (PROVENTIL) (2.5 MG/3ML) 0.083% nebulizer solution 2.5 mg  2.5 mg Nebulization Q1H PRN Patrecia Pour, Christean Grief, MD      . alum & mag hydroxide-simeth (MAALOX/MYLANTA) 200-200-20 MG/5ML suspension 15 mL  15 mL Oral Q4H PRN Rosita Fire, MD   15 mL at 10/20/17 1036  . arformoterol (BROVANA) nebulizer solution 15 mcg  15 mcg Nebulization BID Patrecia Pour, Christean Grief, MD   15 mcg at 10/22/17 [phone]  . budesonide (PULMICORT) nebulizer solution 0.5 mg  0.5 mg Nebulization BID Patrecia Pour, Christean Grief, MD   0.5 mg at 10/22/17 0839  . ceFEPIme (MAXIPIME) 2 g in sodium chloride 0.9 % 100 mL IVPB  2 g Intravenous Q12H Shade, Christine E, RPH 200 mL/hr at 10/22/17 1055 2 g at 10/22/17 1055  . enoxaparin (LOVENOX) injection 40 mg  40 mg Subcutaneous Q24H Patrecia Pour, Christean Grief, MD   40 mg at 10/21/17 1659  . furosemide (LASIX) tablet 80 mg  80 mg Oral Daily Rosita Fire, MD   80 mg at 10/22/17 1057  . insulin aspart (novoLOG) injection 0-9 Units  0-9 Units Subcutaneous Q4H Patrecia Pour, Christean Grief, MD   2 Units at 10/22/17 [id number]  . insulin glargine (LANTUS) injection 15 Units  15 Units Subcutaneous Daily Rosita Fire, MD   15 Units at 10/22/17 1055  . ipratropium-albuterol (DUONEB) 0.5-2.5 (3) MG/3ML nebulizer solution 3 mL  3 mL Nebulization TID Patrecia Pour, Christean Grief, MD   3 mL at 10/22/17 0839  . lactulose (CHRONULAC) 10 GM/15ML solution 30 g  30 g Oral Q6H Patrecia Pour, Christean Grief, MD   30 g at 10/22/17 4765  . MEDLINE mouth rinse  15 mL Mouth Rinse BID Rosita Fire, MD   15 mL at 10/21/17 2225  . pantoprazole (PROTONIX) injection 40 mg  40 mg Intravenous Q24H Rosita Fire,  MD   40 mg at 10/22/17 1058  . phenol (CHLORASEPTIC) mouth spray 1 spray  1 spray Mouth/Throat PRN Rosita Fire, MD      . rifaximin Doreene Nest) tablet 550 mg  550 mg Oral BID Patrecia Pour, Christean Grief, MD   550 mg at 10/22/17 1055  . sodium chloride flush (NS) 0.9 % injection 3 mL  3 mL Intravenous Q12H Patrecia Pour, Christean Grief, MD   3 mL at 10/18/17 2108  . sodium chloride flush (NS) 0.9 % injection 3 mL  3 mL Intravenous Q12H Patrecia Pour, Christean Grief, MD   3 mL at 10/21/17 2226  . sodium chloride flush (NS) 0.9 % injection 3 mL  3 mL Intravenous PRN Patrecia Pour, Christean Grief, MD      . spironolactone (ALDACTONE) tablet 25 mg  25 mg Oral Daily Rosita Fire, MD   25 mg at 10/22/17 1057     Discharge Medications: Please see discharge summary for a list of discharge medications.  Relevant Imaging Results:  Relevant Lab Results:  Additional Information SSN   [id number]  Burnis Medin, LCSW

## 2017-10-22 NOTE — Progress Notes (Signed)
No changes from am assessment. Pt remains a&ox4, out of bed 1 assist. Attempted to call report, RN not available [phone]. Contact information left for a return call.

## 2017-10-22 NOTE — Discharge Summary (Addendum)
Physician Discharge Summary  Ashley Savage FHL:[id number] DOB: 1940-07-29 DOA: 10/18/2017  PCP: Hennie Duos, MD  Admit date: 10/18/2017 Discharge date: 10/22/2017  Admitted From:SNF Disposition:SNF  Recommendations for Outpatient Follow-up:  1. Follow up with PCP in 1-2 weeks 2. Please obtain BMP/CBC in one week 3. Recommended palliative care follow-up   Home Health:SNF Equipment/Devices:none Discharge Condition:stable CODE STATUS:DNR Diet recommendation:carb modified, heart healthy diet  Brief/Interim Summary:77 year old female with history of COPD O2 dependent, hepatic cirrhosis, diabetes mellitus type 2 who presented to the emergency department with altered mental status, and respiratory distress.Upon ED evaluation she was found to be febrile with T-max of 100.6, respiratory distress with hypoxia, elevated lactic acid and somnolent.Ammonia levels were mildly elevated at 47. Patient was placed on BiPAP, started on IV fluids and empiric antibiotics. Patient was admitted with working diagnosis of acute  respiratory failure with hypoxia complicated with hepatic encephalopathy.  #Acute respiratory failure with hypoxia and hypercapnia due to acute COPD exacerbation and acute pulmonary edema: -Patient is off BiPAP.  Treated with bronchodilators, IV Lasix and IV salmeterol.  Also on antibiotics.  Clinically improved.  No shortness of breath or chest pain.  Change to oral Lasix, tapering dose of prednisone and to continue bronchodilators.  Recommended to follow-up with PCP.  #likley sepsis on admission: Cultures negative.  On admission patient had low-grade temperature, leukocytosis and elevated lactic acid level.  Also elevated pro-calcitonin level.    Treated with cefepime and now transitioning to oral Augmentin for discharge.  #Acute metabolic/hepatic encephalopathy: Continue lactulose.  Ammonia level improved.  Treat infection.   mental status improved and around baseline.#Essential  hypertension: Monitor blood pressure.  #Type 2 diabetes with hyperglycemia in the setting of a steroid: Adjusted the dose of insulin.  Monitor blood sugar level.  #Hepatic cirrhosis due to [name] continue lactulose, rifaximin, diuretics.  #Thrombocytopenia in the setting of liver disease.  No sign of bleeding.  Continue to monitor.  #Dyspepsia and GERD: Continue Protonix, MiraLAX.  Abdominal exam benign.  #Acute kidney injury improved  Clinically improved.  A stable on discharge.  Outpatient follow-up with PCP.  Discharge Diagnoses:  Principal Problem:   Acute  respiratory failure with hypoxia (HCC) Active Problems:   Breast cancer (HCC)   Thrombocytopenia (HCC)   Anxiety and depression   Encephalopathy, hepatic (HCC)   Diabetes mellitus type 2, controlled ([place])   Cirrhosis of liver without ascites (HCC)   NASH (nonalcoholic steatohepatitis)   Portal hypertensive gastropathy (HCC)   COPD exacerbation ([place])   Sepsis ([place])    Discharge Instructions  Discharge Instructions    Call MD for:  difficulty breathing, headache or visual disturbances   Complete by:  As directed    Call MD for:  extreme fatigue   Complete by:  As directed    Call MD for:  hives   Complete by:  As directed    Call MD for:  persistant dizziness or light-headedness   Complete by:  As directed    Call MD for:  persistant nausea and vomiting   Complete by:  As directed    Call MD for:  severe uncontrolled pain   Complete by:  As directed    Call MD for:  temperature >100.4   Complete by:  As directed    Diet - low sodium heart healthy   Complete by:  As directed    Diet Carb Modified   Complete by:  As directed    Increase activity slowly   Complete  by:  As directed      Allergies as of 10/22/2017      Reactions   Citalopram Palpitations   Irregular heart beat Irregular heart beat   Ciprofloxacin Other (See Comments)   Mental status change Mental status change   Erythromycin Other (See  Comments)   Stomach cramps Stomach cramps   Glimepiride Other (See Comments)   Elevated ammonia levels Elevated ammonia levels   Prednisone    Increased blood sugars too high      Medication List    STOP taking these medications   torsemide 20 MG tablet Commonly known as:  DEMADEX     TAKE these medications   acetaminophen 325 MG tablet Commonly known as:  TYLENOL Take 650 mg by mouth every 6 (six) hours as needed for mild pain or moderate pain.   albuterol 0.63 MG/3ML nebulizer solution Commonly known as:  ACCUNEB Take 1 ampule by nebulization every 6 (six) hours as needed for shortness of breath.   amoxicillin-clavulanate 500-125 MG tablet Commonly known as:  AUGMENTIN Take 1 tablet (500 mg total) by mouth 2 (two) times daily for 3 days.   BIOFREEZE 4 % Gel Generic drug:  Menthol (Topical Analgesic) Apply 1 application topically 2 (two) times daily. Both knees   bisacodyl 10 MG suppository Commonly known as:  DULCOLAX Place 10 mg rectally daily as needed for moderate constipation.   celecoxib 100 MG capsule Commonly known as:  CELEBREX Take 100 mg by mouth every 8 (eight) hours as needed for mild pain.   FLONASE ALLERGY RELIEF 50 MCG/ACT nasal spray Generic drug:  fluticasone Place 1 spray into both nostrils daily.   furosemide 80 MG tablet Commonly known as:  LASIX Take 80 mg by mouth daily.   hydrocortisone cream 1 % Apply 1 application topically daily as needed for itching. Apply rectally as needed   insulin aspart 100 UNIT/ML FlexPen Commonly known as:  NOVOLOG Inject 0-15 Units into the skin 3 (three) times daily with meals. CBG ac & hs with SSI 70 -120= 0 units ,121-150=2units,151-200=3units,201-250=5units,251-300=8 units,301-350=11units,351-400=15units greater than 400 call MD and give 15units   insulin detemir 100 UNIT/ML injection Commonly known as:  LEVEMIR Inject 0.12 mLs (12 Units total) into the skin 2 (two) times daily. What changed:  how  much to take   lactulose 10 GM/15ML solution Commonly known as:  CHRONULAC Take 45 mLs (30 g total) by mouth every 6 (six) hours. What changed:  how much to take   LORazepam 0.5 MG tablet Commonly known as:  ATIVAN Take 1 tablet (0.5 mg total) by mouth every 6 (six) hours as needed for anxiety.   magnesium hydroxide 400 MG/5ML suspension Commonly known as:  MILK OF MAGNESIA Take 30 mLs by mouth daily as needed for mild constipation.   magnesium oxide 400 MG tablet Commonly known as:  MAG-OX Take 400 mg by mouth 2 (two) times daily.   mometasone-formoterol 100-5 MCG/ACT Aero Commonly known as:  DULERA Inhale 2 puffs into the lungs 2 (two) times daily.   pantoprazole 20 MG tablet Commonly known as:  PROTONIX Take 20 mg by mouth daily.   polyvinyl alcohol 1.4 % ophthalmic solution Commonly known as:  LUBRICANT DROPS Place 1 drop into both eyes as needed (dry eyes).   potassium chloride 10 MEQ tablet Commonly known as:  K-DUR Take 10 mEq by mouth daily.   predniSONE 10 MG tablet Commonly known as:  DELTASONE Take 1 tablet (10 mg total) by mouth daily.  Take 30 mg for 2 days, 20 mg for 2 days, 10 mg for 2 days and then stop.   RA SALINE ENEMA 19-7 GM/118ML Enem Place 1 application rectally daily as needed. Constipation   rifaximin 550 MG Tabs tablet Commonly known as:  XIFAXAN Take 550 mg by mouth 2 (two) times daily.   sodium chloride 0.65 % Soln nasal spray Commonly known as:  OCEAN Place 2 sprays into both nostrils 2 (two) times daily as needed for congestion.   spironolactone 25 MG tablet Commonly known as:  ALDACTONE Take 25 mg by mouth daily.       Contact information for follow-up providers    Hennie Duos, MD. Schedule an appointment as soon as possible for a visit in 1 week(s).   Specialty:  Internal Medicine Contact information: [place] [id number] [phone]            Contact information for after-discharge care     Destination    [facility] .   Service:  Skilled Nursing Contact information: [place] [phone]                 Allergies  Allergen Reactions  . Citalopram Palpitations    Irregular heart beat Irregular heart beat  . Ciprofloxacin Other (See Comments)    Mental status change Mental status change  . Erythromycin Other (See Comments)    Stomach cramps Stomach cramps  . Glimepiride Other (See Comments)    Elevated ammonia levels Elevated ammonia levels  . Prednisone     Increased blood sugars too high    Consultations: Pulmonary  Procedures/Studies: None seen and examined at bedside.  Reported feeling much better.  Denied chest pain, shortness breath, cough, nausea vomiting headache or dizziness.  Subjective:   Discharge Exam: Vitals:   10/22/17 0839 10/22/17 1055  BP:  (!) 131/54  Pulse:  92  Resp:    Temp:    SpO2: 98% 97%   Vitals:   10/21/17 2027 10/22/17 0434 10/22/17 0839 10/22/17 1055  BP: (!) 127/58 128/64  (!) 131/54  Pulse: 95 89  92  Resp: (!) 24 (!) 24    Temp: 98.4 F (36.9 C) 98.3 F (36.8 C)    TempSrc: Oral Oral    SpO2: 97% 93% 98% 97%  Weight:      Height:        General: Pt is alert, awake, not in acute distress Cardiovascular: RRR, S1/S2 +, no rubs, no gallops Respiratory: CTA bilaterally, no wheezing, no rhonchi Abdominal: Soft, NT, ND, bowel sounds + Extremities: no edema, no cyanosis    The results of significant diagnostics from this hospitalization (including imaging, microbiology, ancillary and laboratory) are listed below for reference.     Microbiology: Recent Results (from the past 240 hour(s))  Blood Culture (routine x 2)     Status: None (Preliminary result)   Collection Time: 10/18/17  4:48 AM  Result Value Ref Range Status   Specimen Description   Final    BLOOD RIGHT WRIST Performed at [facility] [address] [place], [place] [address]    Special Requests   Final    BOTTLES DRAWN AEROBIC AND ANAEROBIC Blood Culture results may not be optimal due to an excessive volume of blood received in culture bottles Performed at [facility] [address] [place], [place] [address]    Culture   Final  NO GROWTH 3 DAYS Performed at [facility], [place] [address], [place], [place] [address]    Report Status PENDING  Incomplete  Blood Culture (routine x 2)     Status: None (Preliminary result)   Collection Time: 10/18/17  4:48 AM  Result Value Ref Range Status   Specimen Description   Final    BLOOD RIGHT HAND Performed at [facility] [address], [place], [place] [address]    Special Requests   Final    BOTTLES DRAWN AEROBIC AND ANAEROBIC Blood Culture adequate volume Performed at [facility] [address] [place], [place] [address]    Culture   Final    NO GROWTH 3 DAYS Performed at [facility], [place] [address], [place], [place] [address]    Report Status PENDING  Incomplete  Respiratory Panel by PCR     Status: None   Collection Time: 10/18/17 12:30 PM  Result Value Ref Range Status   Adenovirus NOT DETECTED NOT DETECTED Final   Coronavirus 229E NOT DETECTED NOT DETECTED Final   Coronavirus HKU1 NOT DETECTED NOT DETECTED Final   Coronavirus NL63 NOT DETECTED NOT DETECTED Final   Coronavirus OC43 NOT DETECTED NOT DETECTED Final   Metapneumovirus NOT DETECTED NOT DETECTED Final   Rhinovirus / Enterovirus NOT DETECTED NOT DETECTED Final   Influenza A NOT DETECTED NOT DETECTED Final   Influenza B NOT DETECTED NOT DETECTED Final   Parainfluenza Virus 1 NOT DETECTED NOT DETECTED Final   Parainfluenza Virus 2 NOT DETECTED NOT DETECTED Final   Parainfluenza Virus 3 NOT DETECTED NOT DETECTED Final   Parainfluenza Virus 4 NOT DETECTED NOT DETECTED Final   Respiratory Syncytial Virus NOT DETECTED NOT DETECTED Final    Bordetella pertussis NOT DETECTED NOT DETECTED Final   Chlamydophila pneumoniae NOT DETECTED NOT DETECTED Final   Mycoplasma pneumoniae NOT DETECTED NOT DETECTED Final  MRSA PCR Screening     Status: None   Collection Time: 10/18/17 12:30 PM  Result Value Ref Range Status   MRSA by PCR NEGATIVE NEGATIVE Final    Comment:        The GeneXpert MRSA Assay (FDA approved for NASAL specimens only), is one component of a comprehensive MRSA colonization surveillance program. It is not intended to diagnose MRSA infection nor to guide or monitor treatment for MRSA infections. Performed at [facility], [place], [place] [address], [place],  [address]      Labs: BNP (last 3 results) No results for input(s): BNP in the last 8760 hours. Basic Metabolic Panel: Recent Labs  Lab 10/18/17 0259 10/18/17 1302 10/19/17 0905 10/20/17 0857 10/21/17 0500  NA 140  --  138 133* 135  K 4.0  --  4.2 4.3 4.5  CL 100*  --  104 99* 101  CO2 27  --  26 23 25   GLUCOSE 152*  --  210* 305* 232*  BUN 23*  --  50* 59* 54*  CREATININE 0.93 1.41* 1.34* 1.51* 1.18*  CALCIUM 9.0  --  8.6* 9.0 9.2  MG  --   --  2.4 2.3  --   PHOS  --   --  3.7 3.0  --    Liver Function Tests: Recent Labs  Lab 10/18/17 0259  AST 40  ALT 22  ALKPHOS 187*  BILITOT 3.8*  PROT 5.7*  ALBUMIN 2.3*   No results for input(s): LIPASE, AMYLASE in the last 168 hours. Recent Labs  Lab  10/18/17 0627 10/18/17 1302 10/19/17 0905  AMMONIA 47* 56* 30   CBC: Recent Labs  Lab 10/18/17 0259 10/18/17 1302 10/19/17 0905 10/20/17 0857 10/21/17 0500 10/22/17 0446  WBC 24.5* 24.4* 22.4* 24.8* 16.7* 14.5*  NEUTROABS 21.0*  --   --   --   --   --   HGB 13.0 11.9* 10.5* 11.0* 10.8* 10.9*  HCT 39.7 37.8 31.8* 33.1* 32.3* 32.9*  MCV 97.1 101.3* 97.0 97.1 95.0 94.8  PLT 131* 126* 101* 128* 96* 91*   Cardiac Enzymes: No results for input(s): CKTOTAL, CKMB, CKMBINDEX, TROPONINI in the last 168 hours. BNP: Invalid  input(s): POCBNP CBG: Recent Labs  Lab 10/21/17 2212 10/21/17 2357 10/22/17 0434 10/22/17 0724 10/22/17 1136  GLUCAP 326* 266* 229* 164* 194*   D-Dimer No results for input(s): DDIMER in the last 72 hours. Hgb A1c Recent Labs    10/21/17 1401  HGBA1C 6.1*   Lipid Profile No results for input(s): CHOL, HDL, LDLCALC, TRIG, CHOLHDL, LDLDIRECT in the last 72 hours. Thyroid function studies No results for input(s): TSH, T4TOTAL, T3FREE, THYROIDAB in the last 72 hours.  Invalid input(s): FREET3 Anemia work up No results for input(s): VITAMINB12, FOLATE, FERRITIN, TIBC, IRON, RETICCTPCT in the last 72 hours. Urinalysis    Component Value Date/Time   COLORURINE AMBER (A) 06/25/2017 1500   APPEARANCEUR CLEAR 06/25/2017 1500   LABSPEC >1.030 (H) 06/25/2017 1500   PHURINE 5.5 06/25/2017 1500   GLUCOSEU NEGATIVE 06/25/2017 1500   GLUCOSEU 250 (A) 04/14/2017 1108   HGBUR NEGATIVE 06/25/2017 1500   BILIRUBINUR NEGATIVE 06/25/2017 1500   BILIRUBINUR neg 12/02/2016 1508   KETONESUR NEGATIVE 06/25/2017 1500   PROTEINUR NEGATIVE 06/25/2017 1500   UROBILINOGEN 0.2 04/14/2017 1108   NITRITE NEGATIVE 06/25/2017 1500   LEUKOCYTESUR NEGATIVE 06/25/2017 1500   Sepsis Labs Invalid input(s): PROCALCITONIN,  WBC,  LACTICIDVEN Microbiology Recent Results (from the past 240 hour(s))  Blood Culture (routine x 2)     Status: None (Preliminary result)   Collection Time: 10/18/17  4:48 AM  Result Value Ref Range Status   Specimen Description   Final    BLOOD RIGHT WRIST Performed at [facility], [place] [address] [place], [place] [address]    Special Requests   Final    BOTTLES DRAWN AEROBIC AND ANAEROBIC Blood Culture results may not be optimal due to an excessive volume of blood received in culture bottles Performed at [facility] [address], [place], [place] [address]    Culture   Final    NO GROWTH 3 DAYS Performed at [facility], [place] [address], [place], [place] [address]    Report Status PENDING  Incomplete  Blood Culture (routine x 2)     Status: None (Preliminary result)   Collection Time: 10/18/17  4:48 AM  Result Value Ref Range Status   Specimen Description   Final    BLOOD RIGHT HAND Performed at [facility] [address] [place], [place] [address]    Special Requests   Final    BOTTLES DRAWN AEROBIC AND ANAEROBIC Blood Culture adequate volume Performed at [facility] [address], [place], [place] [address]    Culture   Final    NO GROWTH 3 DAYS Performed at [facility], [place] [address], [place], [place] [address]    Report Status PENDING  Incomplete  Respiratory Panel by PCR     Status: None   Collection  Time: 10/18/17 12:30 PM  Result Value Ref Range Status   Adenovirus NOT DETECTED NOT DETECTED Final   Coronavirus 229E NOT DETECTED NOT DETECTED Final   Coronavirus HKU1 NOT DETECTED NOT DETECTED Final   Coronavirus NL63 NOT DETECTED NOT DETECTED Final   Coronavirus OC43 NOT DETECTED NOT DETECTED Final   Metapneumovirus NOT DETECTED NOT DETECTED Final   Rhinovirus / Enterovirus NOT DETECTED NOT DETECTED Final   Influenza A NOT DETECTED NOT DETECTED Final   Influenza B NOT DETECTED NOT DETECTED Final   Parainfluenza Virus 1 NOT DETECTED NOT DETECTED Final   Parainfluenza Virus 2 NOT DETECTED NOT DETECTED Final   Parainfluenza Virus 3 NOT DETECTED NOT DETECTED Final   Parainfluenza Virus 4 NOT DETECTED NOT DETECTED Final   Respiratory Syncytial Virus NOT DETECTED NOT DETECTED Final   Bordetella pertussis NOT DETECTED NOT DETECTED Final   Chlamydophila pneumoniae NOT DETECTED NOT DETECTED Final   Mycoplasma pneumoniae NOT DETECTED NOT DETECTED Final  MRSA PCR Screening     Status: None   Collection Time: 10/18/17 12:30 PM  Result Value Ref Range Status   MRSA by PCR NEGATIVE NEGATIVE Final    Comment:        The GeneXpert MRSA Assay  (FDA approved for NASAL specimens only), is one component of a comprehensive MRSA colonization surveillance program. It is not intended to diagnose MRSA infection nor to guide or monitor treatment for MRSA infections. Performed at [facility], [place] [address], [place], [place] [address]      Time coordinating discharge: 33 minutes  SIGNED:   Rosita Fire, MD  [facility] 10/22/2017, 12:21 PM  If 7PM-7AM, please contact night-coverage [email or web address] Password [email or web address]

## 2017-10-22 NOTE — Care Management Note (Signed)
Case Management Note  Patient Details  Name: Ashley Savage MRN: [id number] Date of Birth: 10-Mar-1941  Subjective/Objective: Noted palliative to follow @ SNF.                   Action/Plan:d/c SNF.   Expected Discharge Date:  10/22/17               Expected Discharge Plan:  Skilled Nursing Facility  In-House Referral:  Clinical Social Work  Discharge planning Services  CM Consult  Post Acute Care Choice:    Choice offered to:     DME Arranged:    DME Agency:     HH Arranged:    [place] Agency:     Status of Service:  Completed, signed off  If discussed at [employer], dates discussed:    Additional Comments:  Dessa Phi, RN 10/22/2017, 2:48 PM

## 2017-10-22 NOTE — Progress Notes (Signed)
CSW following to assist patient with discharge back to [facility].  CSW completed FL2.  CSW started patient's insurance authorization with [employer], staff reported that patient's authorization will need to go to medical director for review.  CSW will continue to follow and assist with discharge planning.  Abundio Miu, [place] Social Worker [facility] Cell#: [phone]

## 2017-10-23 LAB — CULTURE, BLOOD (ROUTINE X 2)
CULTURE: NO GROWTH
Culture: NO GROWTH
SPECIAL REQUESTS: ADEQUATE

## 2017-10-25 ENCOUNTER — Encounter: Payer: Self-pay | Admitting: Internal Medicine

## 2017-10-25 ENCOUNTER — Telehealth: Payer: Self-pay

## 2017-10-25 ENCOUNTER — Non-Acute Institutional Stay (SKILLED_NURSING_FACILITY): Payer: PPO | Admitting: Internal Medicine

## 2017-10-25 DIAGNOSIS — K219 Gastro-esophageal reflux disease without esophagitis: Secondary | ICD-10-CM | POA: Diagnosis not present

## 2017-10-25 DIAGNOSIS — Z794 Long term (current) use of insulin: Secondary | ICD-10-CM | POA: Diagnosis not present

## 2017-10-25 DIAGNOSIS — G9341 Metabolic encephalopathy: Secondary | ICD-10-CM | POA: Diagnosis not present

## 2017-10-25 DIAGNOSIS — K7581 Nonalcoholic steatohepatitis (NASH): Secondary | ICD-10-CM

## 2017-10-25 DIAGNOSIS — N179 Acute kidney failure, unspecified: Secondary | ICD-10-CM | POA: Diagnosis not present

## 2017-10-25 DIAGNOSIS — J810 Acute pulmonary edema: Secondary | ICD-10-CM | POA: Diagnosis not present

## 2017-10-25 DIAGNOSIS — J441 Chronic obstructive pulmonary disease with (acute) exacerbation: Secondary | ICD-10-CM

## 2017-10-25 DIAGNOSIS — K7460 Unspecified cirrhosis of liver: Secondary | ICD-10-CM | POA: Diagnosis not present

## 2017-10-25 DIAGNOSIS — A419 Sepsis, unspecified organism: Secondary | ICD-10-CM | POA: Diagnosis not present

## 2017-10-25 DIAGNOSIS — J9602 Acute respiratory failure with hypercapnia: Secondary | ICD-10-CM | POA: Diagnosis not present

## 2017-10-25 DIAGNOSIS — J9601 Acute respiratory failure with hypoxia: Secondary | ICD-10-CM | POA: Diagnosis not present

## 2017-10-25 DIAGNOSIS — E1142 Type 2 diabetes mellitus with diabetic polyneuropathy: Secondary | ICD-10-CM | POA: Diagnosis not present

## 2017-10-25 NOTE — Telephone Encounter (Signed)
Possible re-admission to facility. This is a patient you were seeing at [facility]. [facility] F/U is needed if patient was re-admitted to facility upon discharge. Hospital discharge from [facility] on 10/22/2017

## 2017-10-25 NOTE — Progress Notes (Signed)
: Provider:  Noah Delaine. Sheppard Coil, MD Location:  [place] Room Number: [phone] Place of Service:  SNF ([id number])  PCP: Hennie Duos, MD Patient Care Team: Hennie Duos, MD as PCP - General (Internal Medicine) Dene Gentry, MD as Consulting Physician (Sports Medicine) Mauri Pole, MD as Consulting Physician (Gastroenterology) Rigoberto Noel, MD as Consulting Physician (Pulmonary Disease) Parrett, Fonnie Mu, NP as Nurse Practitioner (Pulmonary Disease) Volanda Napoleon, MD as Consulting Physician (Oncology) Greg Cutter, LCSW as [facility] (Licensed Clinical Social Worker)  Extended Emergency Contact Information Primary Emergency Contact: Crawford Givens          [place], [name] Phone: [phone] Mobile Phone: [phone] Relation: Friend Secondary Emergency Contact: Kathryne Hitch Address: [address] [place]          [place],  [address] [place] Phone: [phone] Mobile Phone: [phone] Relation: Friend     Allergies: Citalopram; Ciprofloxacin; Erythromycin; Glimepiride; and Prednisone  Chief Complaint  Patient presents with  . Readmit To SNF    Admit to Facility    HPI: Patient is 77 y.o. female with 2 L O2 dependent COPD, hepatic cirrhosis, diabetes mellitus, hyperlipidemia, who presented from her facility with hypoxia and respiratory distress, with wheezing that had begun the previous day and had worsened overnight despite multiple breathing treatments.  Patient has had chills and productive cough of yellow-green sputum for the past week.  Able to the ED patient was febrile to 100.6 rectally, and moderate respiratory distress with hypoxia sepsis with initial lactic acid of 3.75 and an ABG with pH 7.31, PCO2 38, PO2 77.  She grew restless on Ventimask and was given Ativan with subsequent drop in blood pressure.  Ammonia was mildly elevated at 47 and repeat  ABG showed respiratory acidosis with a pH of 7.16, PCO2 47 and PO2 of 114.  Patient was admitted to [facility] from 3/20 5-29 where she was treated for acute hypoxic respiratory failure secondary to COPD exacerbation and pulmonary edema and presumed sepsis.  Patient was treated initially with cefepime then transitioned to oral Augmentin for discharge she was also treated initially with BiPAP, IV Lasix and IV salmeterol patient did suffer acute metabolic encephalopathy but did not appear to be hepatic as her ammonia level was 46 which is her baseline.  Patient did have some acute kidney injury, with her creatinine returning to baseline.  Patient is admitted back to skilled nursing facility for OT/PT due to continued weakness from her prolonged hospitalization prior to this 1.  While at skilled nursing facility patient will be followed for hepatic cirrhosis treated with Lasix lactulose rifaximin and Spironolactone, diabetes treated with insulin and GERD treated with Protonix.  Past Medical History:  Diagnosis Date  . Abdominal aortic aneurysm ([place]) 10/01/2013   Fall of 2014 3.3 per patient, follows with Vascular surgeon.   . Acute respiratory failure with hypoxia ([place]) 07/31/2015  . Allergic state 11/10/2016  . Anxiety   . Anxiety and depression 02/01/2014  . Arthritis of both knees 10/01/2013  . Arthritis of right knee 10/01/2013   Follows with Dr Mayer Camel   . Benign paroxysmal positional vertigo 10/01/2013  . Breast cancer ([place])    No disease activity On Femara Follows with Dr Marin Olp Right mastectomy performed by Dr Autumn Messing   . Cancer [facility]) breast ca  right  . Chronic respiratory failure ([place]) 08/29/2015  . Cirrhosis of liver without  ascites ([place]) 07/31/2015  . COPD (chronic obstructive pulmonary disease) ([place]) 10/01/2013  . COPD with acute exacerbation ([place]) 05/05/2017  . Depression   . Dermatitis 03/30/2017  . Diabetes mellitus type 2  . Diabetes mellitus type 2, controlled ([place]) 10/16/2014  . Emphysema     . Encephalopathy, hepatic ([place]) 06/07/2014  . Esophageal reflux 10/01/2013  . Fall 07/30/2016  . Hyperlipidemia   . Hyperlipidemia, mixed   . Increased ammonia level 11/25/2014  . NASH (nonalcoholic steatohepatitis) 08/26/2015  . Neck pain 10/01/2013  . Neuropathy    feet   . Osteopenia 03/30/2017  . Overactive bladder 12/10/2013  . Panic attacks   . Pedal edema 12/10/2013  . Personal history of radiation therapy   . Preventative health care 03/08/2016  . Thrombocytopenia ([place]) 03/17/2012  . Tobacco abuse disorder 02/01/2014  . Type 2 diabetes mellitus with hyperglycemia, with long-term current use of insulin ([place])   . Urine frequency 04/13/2017    Past Surgical History:  Procedure Laterality Date  . APPENDECTOMY  2007  . BREAST SURGERY  2009 right  . CATARACT EXTRACTION     x 2  . ESOPHAGOGASTRODUODENOSCOPY (EGD) WITH PROPOFOL N/A 08/14/2016   Procedure: ESOPHAGOGASTRODUODENOSCOPY (EGD) WITH PROPOFOL;  Surgeon: Mauri Pole, MD;  Location: [facility];  Service: Endoscopy;  Laterality: N/A;  . [place]  . KNEE SURGERY    . MANDIBLE FRACTURE SURGERY    . MASTECTOMY    . PILONIDAL CYST EXCISION    . TONSILLECTOMY      Allergies as of 10/25/2017      Reactions   Citalopram Palpitations   Irregular heart beat Irregular heart beat   Ciprofloxacin Other (See Comments)   Mental status change Mental status change   Erythromycin Other (See Comments)   Stomach cramps Stomach cramps   Glimepiride Other (See Comments)   Elevated ammonia levels Elevated ammonia levels   Prednisone    Increased blood sugars too high      Medication List        Accurate as of 10/25/17  9:48 AM. Always use your most recent med list.          acetaminophen 325 MG tablet Commonly known as:  TYLENOL Take 650 mg by mouth every 6 (six) hours as needed for mild pain or moderate pain.   albuterol 0.63 MG/3ML nebulizer solution Commonly known as:  ACCUNEB Take 1 ampule by  nebulization every 6 (six) hours as needed for shortness of breath.   amoxicillin-clavulanate 500-125 MG tablet Commonly known as:  AUGMENTIN Take 1 tablet (500 mg total) by mouth 2 (two) times daily for 3 days.   BIOFREEZE 4 % Gel Generic drug:  Menthol (Topical Analgesic) Apply 1 application topically 2 (two) times daily. Both knees   bisacodyl 10 MG suppository Commonly known as:  DULCOLAX Place 10 mg rectally daily as needed for moderate constipation.   celecoxib 100 MG capsule Commonly known as:  CELEBREX Take 100 mg by mouth every 8 (eight) hours as needed for mild pain.   FLONASE ALLERGY RELIEF 50 MCG/ACT nasal spray Generic drug:  fluticasone Place 1 spray into both nostrils daily.   furosemide 80 MG tablet Commonly known as:  LASIX Take 80 mg by mouth daily.   hydrocortisone cream 1 % Apply 1 application topically daily as needed for itching. Apply rectally as needed   insulin aspart 100 UNIT/ML FlexPen Commonly known as:  NOVOLOG Inject 0-15 Units into the skin 3 (three) times  daily with meals. CBG ac & hs with SSI 70 -120= 0 units ,121-150=2units,151-200=3units,201-250=5units,251-300=8 units,301-350=11units,351-400=15units greater than 400 call MD and give 15units   insulin detemir 100 UNIT/ML injection Commonly known as:  LEVEMIR Inject 0.12 mLs (12 Units total) into the skin 2 (two) times daily.   lactulose 10 GM/15ML solution Commonly known as:  CHRONULAC Take 45 mLs (30 g total) by mouth every 6 (six) hours.   LORazepam 0.5 MG tablet Commonly known as:  ATIVAN Take 1 tablet (0.5 mg total) by mouth every 6 (six) hours as needed for anxiety.   magnesium hydroxide 400 MG/5ML suspension Commonly known as:  MILK OF MAGNESIA Take 30 mLs by mouth daily as needed for mild constipation.   magnesium oxide 400 MG tablet Commonly known as:  MAG-OX Take 400 mg by mouth 2 (two) times daily.   mometasone-formoterol 100-5 MCG/ACT Aero Commonly known as:   DULERA Inhale 2 puffs into the lungs 2 (two) times daily.   pantoprazole 20 MG tablet Commonly known as:  PROTONIX Take 20 mg by mouth daily.   polyvinyl alcohol 1.4 % ophthalmic solution Commonly known as:  LUBRICANT DROPS Place 1 drop into both eyes as needed (dry eyes).   potassium chloride 10 MEQ tablet Commonly known as:  K-DUR Take 10 mEq by mouth daily.   predniSONE 10 MG tablet Commonly known as:  DELTASONE Take 1 tablet (10 mg total) by mouth daily. Take 30 mg for 2 days, 20 mg for 2 days, 10 mg for 2 days and then stop.   RA SALINE ENEMA 19-7 GM/118ML Enem Place 1 application rectally daily as needed. Constipation   rifaximin 550 MG Tabs tablet Commonly known as:  XIFAXAN Take 550 mg by mouth 2 (two) times daily.   sodium chloride 0.65 % Soln nasal spray Commonly known as:  OCEAN Place 2 sprays into both nostrils 2 (two) times daily as needed for congestion.   spironolactone 25 MG tablet Commonly known as:  ALDACTONE Take 25 mg by mouth daily.       No orders of the defined types were placed in this encounter.   Immunization History  Administered Date(s) Administered  . Influenza Split 04/09/2011, 03/27/2012, 03/27/2013  . Influenza, High Dose Seasonal PF 03/30/2017  . Influenza,inj,Quad PF,6+ Mos 03/26/2015  . Influenza-Unspecified 04/24/2014, 04/23/2016, 03/30/2017  . Pneumococcal Conjugate-13 02/01/2014  . Pneumococcal Polysaccharide-23 04/09/2011  . Td 07/27/2010  . Tdap 05/08/2014    Social History   Tobacco Use  . Smoking status: Former Smoker    Packs/day: 0.25    Years: 58.00    Pack years: 14.50    Types: Cigarettes    Start date: 07/27/1964  . Smokeless tobacco: Never Used  Substance Use Topics  . Alcohol use: No    Alcohol/week: 0.0 oz    Family history is   Family History  Problem Relation Age of Onset  . Heart failure Father   . COPD Father   . Arthritis Father 44  . Stroke Mother   . Arthritis Mother 38  .  Hyperlipidemia Mother   . Hypertension Mother   . Diabetes Mother   . Diabetes Sister   . Breast cancer Unknown   . Breast cancer Maternal Aunt   . Asthma Maternal Aunt   . Birth defects Maternal Aunt   . Alcohol abuse Maternal Uncle   . Breast cancer Maternal Aunt   . Stomach cancer Neg Hx   . Colon cancer Neg Hx  Review of Systems  DATA OBTAINED: from patient-no complaint of GENERAL:  no fevers, fatigue, appetite changes SKIN: No itching, or rash EYES: No eye pain, redness, discharge EARS: No earache, tinnitus, change in hearing NOSE: No congestion, drainage or bleeding  MOUTH/THROAT: No mouth or tooth pain, No sore throat RESPIRATORY: No cough, wheezing, SOB CARDIAC: No chest pain, palpitations, lower extremity edema  GI: No abdominal pain, No N/V/D or constipation, No heartburn or reflux  GU: No dysuria, frequency or urgency, or incontinence  MUSCULOSKELETAL: No unrelieved bone/joint pain NEUROLOGIC: No headache, dizziness or focal weakness PSYCHIATRIC: No c/o anxiety or sadness   Vitals:   10/25/17 0930  BP: 122/64  Pulse: 77  Resp: 20  Temp: 97.8 F (36.6 C)  SpO2: 90%    SpO2 Readings from Last 1 Encounters:  10/25/17 90%   Body mass index is 26.79 kg/m.     Physical Exam  GENERAL APPEARANCE: Alert, conversant,  No acute distress; white female with hepatic cirrhosis eating a big Mac and fries SKIN: No diaphoresis rash HEAD: Normocephalic, atraumatic  EYES: Conjunctiva/lids clear. Pupils round, reactive. EOMs intact.  EARS: External exam WNL, canals clear. Hearing grossly normal.  NOSE: No deformity or discharge.  MOUTH/THROAT: Lips w/o lesions  RESPIRATORY: Breathing is even, unlabored. Lung sounds are clear   CARDIOVASCULAR: Heart RRR no murmurs, rubs or gallops. 2+ peripheral edema.   GASTROINTESTINAL: Abdomen is soft, non-tender, not distended w/ normal bowel sounds; no ascites GENITOURINARY: Bladder non tender, not distended   MUSCULOSKELETAL: No abnormal joints or musculature NEUROLOGIC:  Cranial nerves 2-12 grossly intact. Moves all extremities  PSYCHIATRIC: Mood and affect appropriate to situation, no behavioral issues  Patient Active Problem List   Diagnosis Date Noted  . Sepsis ([place])   . Acute on chronic respiratory failure with hypoxia ([place]) 10/18/2017  . Leg edema, left 10/02/2017  . Arthritis 10/02/2017  . COPD exacerbation ([place]) 06/25/2017  . Ventricular tachycardia ([place]) 05/22/2017  . Acute kidney injury ([place]) 05/17/2017  . Hyperkalemia 05/17/2017  . Vitamin D deficiency 05/17/2017  . COPD with acute exacerbation ([place]) 05/05/2017  . Urine frequency 04/13/2017  . Osteopenia 03/30/2017  . Dermatitis 03/30/2017  . Allergic state 11/10/2016  . 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 Portal hypertensive gastropathy ([place])   . Lower back injury, initial encounter 08/05/2016  . Fall 07/30/2016  . Preventative health care 03/08/2016  . Muscle spasm 02/25/2016  . Chronic respiratory failure ([place]) 08/29/2015  . NASH (nonalcoholic steatohepatitis) 08/26/2015  . Type 2 diabetes mellitus with hyperglycemia, with long-term current use of insulin ([place])   . Cirrhosis of liver without ascites ([place]) 07/31/2015  . Acute respiratory failure with hypoxia ([place]) 07/31/2015  . Diarrhea 12/30/2014  . Increased ammonia level 11/25/2014  . Diabetes mellitus type 2, controlled ([place]) 10/16/2014  . Encephalopathy, hepatic ([place]) 06/07/2014  . Right knee pain 06/07/2014  . Sun-damaged skin 02/01/2014  . Anxiety and depression 02/01/2014  . Tobacco abuse 02/01/2014  . Medicare annual wellness visit, subsequent 02/01/2014  . Pedal edema 12/10/2013  . Overactive bladder 12/10/2013  . Abdominal aortic aneurysm ([place]) 10/01/2013  . Arthritis of right knee 10/01/2013  . Benign paroxysmal positional vertigo 10/01/2013  . Neck pain 10/01/2013  . Esophageal reflux 10/01/2013  . Thrombocytopenia ([place]) 03/17/2012  .  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   . Hyperlipidemia, mixed   . Breast cancer ([place])       Labs reviewed: Basic Metabolic Panel:  Component Value Date/Time   NA 135 10/21/2017 0500   NA 140 10/15/2017   NA 141 07/07/2017 1102   NA 139 06/29/2016 1128   K 4.5 10/21/2017 0500   K 4.8 10/15/2017   K 5.0 06/29/2016 1128   CL 101 10/21/2017 0500   CL 99 07/07/2017 1102   CO2 25 10/21/2017 0500   CO2 30 07/07/2017 1102   CO2 24 06/29/2016 1128   GLUCOSE 232 (H) 10/21/2017 0500   GLUCOSE 178 (H) 07/07/2017 1102   BUN 54 (H) 10/21/2017 0500   BUN 14 10/15/2017   BUN 22 07/07/2017 1102   BUN 15.9 06/29/2016 1128   CREATININE 1.18 (H) 10/21/2017 0500   CREATININE 0.89 10/15/2017   CREATININE 1.1 06/29/2016 1128   CALCIUM 9.2 10/21/2017 0500   CALCIUM 8.5 10/15/2017   CALCIUM 9.0 06/29/2016 1128   PROT 5.7 (L) 10/18/2017 0259   PROT 5.1 10/15/2017   PROT 5.3 (L) 07/07/2017 1102   PROT 5.9 (L) 06/29/2016 1128   ALBUMIN 2.3 (L) 10/18/2017 0259   ALBUMIN 2.2 10/15/2017   ALBUMIN 2.7 (L) 06/29/2016 1128   AST 40 10/18/2017 0259   AST 28 10/15/2017   AST 36 (H) 06/29/2016 1128   ALT 22 10/18/2017 0259   ALT 19 10/15/2017   ALT 44 07/07/2017 1102   ALT 24 06/29/2016 1128   ALKPHOS 187 (H) 10/18/2017 0259   ALKPHOS 191 10/15/2017   ALKPHOS 129 06/29/2016 1128   BILITOT 3.8 (H) 10/18/2017 0259   BILITOT 1.2 10/15/2017   BILITOT 1.78 (H) 06/29/2016 1128   GFRNONAA 44 (L) 10/21/2017 0500   GFRAA 51 (L) 10/21/2017 0500    Recent Labs    05/08/17 0421  10/19/17 0905 10/20/17 0857 10/21/17 0500  NA 137   < > 138 133* 135  K 4.2   < > 4.2 4.3 4.5  CL 103   < > 104 99* 101  CO2 25   < > _0 GLUCOSE 185*   < > 210* 305* 232*  BUN 46*   < > 50* 59* 54*  CREATININE 1.03*   < > 1.34* 1.51* 1.18*  CALCIUM 8.4*   < > 8.6* 9.0 9.2  MG 2.0  --  2.4 2.3  --   PHOS  --   --  3.7 3.0  --    < > = values in this interval not displayed.    Liver Function Tests: Recent Labs    07/14/17 1208 10/15/17 10/18/17 0259  AST 21 28 40  ALT _1 ALKPHOS 107 191 187*  BILITOT 2.0* 1.2 3.8*  PROT 5.5* 5.1 5.7*  ALBUMIN 2.1* 2.2 2.3*   No results for input(s): LIPASE, AMYLASE in the last 8760 hours. Recent Labs    10/18/17 0627 10/18/17 1302 10/19/17 0905  AMMONIA 47* 56* 30   CBC: Recent Labs    07/07/17 1102 07/14/17 1208  10/18/17 0259  10/20/17 0857 10/21/17 0500 10/22/17 0446  WBC 11.5* 10.0   < > 24.5*   < > 24.8* 16.7* 14.5*  NEUTROABS 8.5* 7.4  --  21.0*  --   --   --   --   HGB 12.4 12.2   < > 13.0   < > 11.0* 10.8* 10.9*  HCT 36.7 37.5   < > 39.7   < > 33.1* 32.3* 32.9*  MCV 99 102.3*  --  97.1   < > 97.1 95.0 94.8  PLT 83* 111.0*   < >  131*   < > 128* 96* 91*   < > = values in this interval not displayed.   Lipid Recent Labs    11/10/16 1439 02/09/17 1222  CHOL 164 160  HDL 40.60 48.40  LDLCALC 106* 96  TRIG 85.0 78.0    Cardiac Enzymes: Recent Labs    05/05/17 1853  TROPONINI <0.03   BNP: No results for input(s): BNP in the last 8760 hours. Lab Results  Component Value Date   MICROALBUR <0.7 02/25/2016   Lab Results  Component Value Date   HGBA1C 6.1 (H) 10/21/2017   Lab Results  Component Value Date   TSH 1.16 11/10/2016   Lab Results  Component Value Date   VITAMINB12 644 10/16/2014   Lab Results  Component Value Date   FOLATE >20.0 10/16/2014   Lab Results  Component Value Date   IRON 54 07/31/2015   TIBC 344 07/31/2015   FERRITIN 16 07/31/2015    Imaging and Procedures obtained prior to SNF admission: Dg Chest 2 View  Result Date: 10/18/2017 CLINICAL DATA:  77 year old female with shortness of breath, hypoxia, and wheezing. EXAM: CHEST - 2 VIEW COMPARISON:  Chest radiograph dated 08/30/2017 FINDINGS: Emphysema with chronic interstitial coarsening. No focal consolidation, pleural effusion, or pneumothorax. Mild cardiomegaly. Atherosclerotic calcification  of the aortic arch. No acute osseous pathology. IMPRESSION: 1. No acute cardiopulmonary process. 2. Emphysema. Electronically Signed   By: Anner Crete M.D.   On: 10/18/2017 04:49   Dg Chest Port 1 View  Result Date: 10/19/2017 CLINICAL DATA:  Followup respiratory failure. EXAM: PORTABLE CHEST 1 VIEW COMPARISON:  10/18/2017 FINDINGS: The patient is rotated towards the right. Interstitial markings appear more prominent, suggesting fluid overload/early interstitial edema. No consolidation or collapse. Possible tiny effusions. IMPRESSION: Suspicion of fluid overload/early interstitial edema. Tiny effusions. No consolidation or lobar collapse. This film is rotated towards the right. Electronically Signed   By: Nelson Chimes M.D.   On: 10/19/2017 07:44     Not all labs, radiology exams or other studies done during hospitalization come through on my EPIC note; however they are reviewed by me.    Assessment and Plan  Sepsis/acute respiratory failure with hypoxia and hypercapnia/acute COPD exacerbation/acute pulmonary edema/acute metabolic encephalopathy- leukocytosis, elevated lactic acid, low-grade fever; treated with BiPAP and bronchodilators, IV Lasix and IV salmeterol along with cefepime; patient changed to oral Lasix tapering dose of prednisone and oral Augmentin SNF -admitted for OG/PT and for generalized weakness from prior prolonged hospitalization continue Augmentin 500 mg twice daily for 3 more days; continue Lasix 80 mg daily and Spironolactone 25 mg daily; continue prednisone taper-30 mg for 2 days 20 mg for 2 days then 10 mg for 2 days then stop  Hepatic cirrhosis due to [facility] SN[facility] -lactulose 45 g every 6 hours rifaximin 550 mg twice daily and diuretics-Lasix 80 mg daily and Spironolactone 25 mg daily  Acute kidney injury SNF -follow-up BMP  Diabetes mellitus type II -blood sugars a little high on steroids; will improve when steroids are stopped SNF - continue Levemir 12 units twice  daily along with sliding scale insulin with meals  GERD SNF -stable; continue Protonix 20 mg daily   Time spent greater than 35 minutes;> 50% of time with patient was spent reviewing records, labs, tests and studies, counseling and developing plan of care  Webb Silversmith D. Sheppard Coil, MD

## 2017-10-26 ENCOUNTER — Other Ambulatory Visit: Payer: Self-pay | Admitting: Licensed Clinical Social Worker

## 2017-10-26 ENCOUNTER — Encounter: Payer: Self-pay | Admitting: Internal Medicine

## 2017-10-26 DIAGNOSIS — J9601 Acute respiratory failure with hypoxia: Secondary | ICD-10-CM | POA: Insufficient documentation

## 2017-10-26 DIAGNOSIS — K7469 Other cirrhosis of liver: Secondary | ICD-10-CM | POA: Insufficient documentation

## 2017-10-26 DIAGNOSIS — K7581 Nonalcoholic steatohepatitis (NASH): Secondary | ICD-10-CM

## 2017-10-26 DIAGNOSIS — J9602 Acute respiratory failure with hypercapnia: Secondary | ICD-10-CM

## 2017-10-26 DIAGNOSIS — J810 Acute pulmonary edema: Secondary | ICD-10-CM | POA: Insufficient documentation

## 2017-10-26 DIAGNOSIS — K7460 Unspecified cirrhosis of liver: Secondary | ICD-10-CM | POA: Insufficient documentation

## 2017-10-26 DIAGNOSIS — G9341 Metabolic encephalopathy: Secondary | ICD-10-CM | POA: Insufficient documentation

## 2017-10-26 NOTE — Patient Outreach (Signed)
Ashley Savage [facility]) Care Management  10/26/2017  Ashley Savage [birth year] [id number]  Assessment- [facility] CSW spoke with [name] social worker in order to gain updates on patient and was notified that patient has now transitioned to LTC placement at facility which means patient is no longer eligible for [facility]. [facility] CSW will close case at this time and will notify PCP.  Plan-[facility] CSW will complete case closure at this time as patient is now at [facility] for LTC.   [name], BSW, MSW, [name] Phone: [phone] Fax: [phone]

## 2017-10-27 DIAGNOSIS — R531 Weakness: Secondary | ICD-10-CM | POA: Diagnosis not present

## 2017-10-27 LAB — BASIC METABOLIC PANEL
BUN: 21 (ref 4–21)
Creatinine: 0.7 (ref 0.5–1.1)
GLUCOSE: 192
POTASSIUM: 4.6 (ref 3.4–5.3)
Sodium: 141 (ref 137–147)

## 2017-10-27 LAB — CBC AND DIFFERENTIAL
HEMATOCRIT: 33 — AB (ref 36–46)
HEMOGLOBIN: 11.7 — AB (ref 12.0–16.0)
Platelets: 72 — AB (ref 150–399)
WBC: 9.1

## 2017-10-30 ENCOUNTER — Emergency Department (HOSPITAL_COMMUNITY): Payer: PPO

## 2017-10-30 ENCOUNTER — Inpatient Hospital Stay (HOSPITAL_COMMUNITY)
Admission: EM | Admit: 2017-10-30 | Discharge: 2017-11-04 | DRG: 441 | Disposition: A | Discharge status: Skilled Nursing Facility | Payer: PPO | Attending: Internal Medicine | Admitting: Internal Medicine

## 2017-10-30 ENCOUNTER — Encounter (HOSPITAL_COMMUNITY): Payer: Self-pay | Admitting: Emergency Medicine

## 2017-10-30 DIAGNOSIS — K7290 Hepatic failure, unspecified without coma: Secondary | ICD-10-CM

## 2017-10-30 DIAGNOSIS — Z794 Long term (current) use of insulin: Secondary | ICD-10-CM

## 2017-10-30 DIAGNOSIS — Z8249 Family history of ischemic heart disease and other diseases of the circulatory system: Secondary | ICD-10-CM

## 2017-10-30 DIAGNOSIS — R4182 Altered mental status, unspecified: Secondary | ICD-10-CM | POA: Diagnosis not present

## 2017-10-30 DIAGNOSIS — Z9011 Acquired absence of right breast and nipple: Secondary | ICD-10-CM

## 2017-10-30 DIAGNOSIS — K3189 Other diseases of stomach and duodenum: Secondary | ICD-10-CM | POA: Diagnosis present

## 2017-10-30 DIAGNOSIS — J449 Chronic obstructive pulmonary disease, unspecified: Secondary | ICD-10-CM | POA: Diagnosis present

## 2017-10-30 DIAGNOSIS — J441 Chronic obstructive pulmonary disease with (acute) exacerbation: Secondary | ICD-10-CM | POA: Diagnosis not present

## 2017-10-30 DIAGNOSIS — J9611 Chronic respiratory failure with hypoxia: Secondary | ICD-10-CM | POA: Diagnosis not present

## 2017-10-30 DIAGNOSIS — R488 Other symbolic dysfunctions: Secondary | ICD-10-CM | POA: Diagnosis not present

## 2017-10-30 DIAGNOSIS — F419 Anxiety disorder, unspecified: Secondary | ICD-10-CM | POA: Diagnosis present

## 2017-10-30 DIAGNOSIS — F329 Major depressive disorder, single episode, unspecified: Secondary | ICD-10-CM | POA: Diagnosis not present

## 2017-10-30 DIAGNOSIS — J189 Pneumonia, unspecified organism: Secondary | ICD-10-CM | POA: Diagnosis present

## 2017-10-30 DIAGNOSIS — J4489 Other specified chronic obstructive pulmonary disease: Secondary | ICD-10-CM | POA: Diagnosis present

## 2017-10-30 DIAGNOSIS — R8290 Unspecified abnormal findings in urine: Secondary | ICD-10-CM | POA: Diagnosis not present

## 2017-10-30 DIAGNOSIS — D696 Thrombocytopenia, unspecified: Secondary | ICD-10-CM | POA: Diagnosis not present

## 2017-10-30 DIAGNOSIS — R29810 Facial weakness: Secondary | ICD-10-CM | POA: Diagnosis not present

## 2017-10-30 DIAGNOSIS — E1165 Type 2 diabetes mellitus with hyperglycemia: Secondary | ICD-10-CM | POA: Diagnosis not present

## 2017-10-30 DIAGNOSIS — Z853 Personal history of malignant neoplasm of breast: Secondary | ICD-10-CM | POA: Diagnosis not present

## 2017-10-30 DIAGNOSIS — K7210 Chronic hepatic failure without coma: Secondary | ICD-10-CM | POA: Diagnosis not present

## 2017-10-30 DIAGNOSIS — M6281 Muscle weakness (generalized): Secondary | ICD-10-CM | POA: Diagnosis not present

## 2017-10-30 DIAGNOSIS — K7581 Nonalcoholic steatohepatitis (NASH): Secondary | ICD-10-CM | POA: Diagnosis present

## 2017-10-30 DIAGNOSIS — Z9841 Cataract extraction status, right eye: Secondary | ICD-10-CM

## 2017-10-30 DIAGNOSIS — Z79899 Other long term (current) drug therapy: Secondary | ICD-10-CM

## 2017-10-30 DIAGNOSIS — J181 Lobar pneumonia, unspecified organism: Secondary | ICD-10-CM

## 2017-10-30 DIAGNOSIS — Z923 Personal history of irradiation: Secondary | ICD-10-CM

## 2017-10-30 DIAGNOSIS — Z803 Family history of malignant neoplasm of breast: Secondary | ICD-10-CM

## 2017-10-30 DIAGNOSIS — I6789 Other cerebrovascular disease: Secondary | ICD-10-CM | POA: Diagnosis not present

## 2017-10-30 DIAGNOSIS — E1140 Type 2 diabetes mellitus with diabetic neuropathy, unspecified: Secondary | ICD-10-CM | POA: Diagnosis not present

## 2017-10-30 DIAGNOSIS — I10 Essential (primary) hypertension: Secondary | ICD-10-CM | POA: Diagnosis not present

## 2017-10-30 DIAGNOSIS — E785 Hyperlipidemia, unspecified: Secondary | ICD-10-CM | POA: Diagnosis not present

## 2017-10-30 DIAGNOSIS — J690 Pneumonitis due to inhalation of food and vomit: Secondary | ICD-10-CM | POA: Diagnosis present

## 2017-10-30 DIAGNOSIS — Z87891 Personal history of nicotine dependence: Secondary | ICD-10-CM

## 2017-10-30 DIAGNOSIS — N390 Urinary tract infection, site not specified: Secondary | ICD-10-CM | POA: Diagnosis not present

## 2017-10-30 DIAGNOSIS — Z8349 Family history of other endocrine, nutritional and metabolic diseases: Secondary | ICD-10-CM

## 2017-10-30 DIAGNOSIS — Z9842 Cataract extraction status, left eye: Secondary | ICD-10-CM

## 2017-10-30 DIAGNOSIS — B9689 Other specified bacterial agents as the cause of diseases classified elsewhere: Secondary | ICD-10-CM | POA: Diagnosis not present

## 2017-10-30 DIAGNOSIS — Z66 Do not resuscitate: Secondary | ICD-10-CM | POA: Diagnosis present

## 2017-10-30 DIAGNOSIS — Z9181 History of falling: Secondary | ICD-10-CM | POA: Diagnosis not present

## 2017-10-30 DIAGNOSIS — R0600 Dyspnea, unspecified: Secondary | ICD-10-CM | POA: Diagnosis not present

## 2017-10-30 DIAGNOSIS — K7460 Unspecified cirrhosis of liver: Secondary | ICD-10-CM | POA: Diagnosis not present

## 2017-10-30 DIAGNOSIS — K219 Gastro-esophageal reflux disease without esophagitis: Secondary | ICD-10-CM | POA: Diagnosis not present

## 2017-10-30 DIAGNOSIS — E782 Mixed hyperlipidemia: Secondary | ICD-10-CM | POA: Diagnosis not present

## 2017-10-30 DIAGNOSIS — Z825 Family history of asthma and other chronic lower respiratory diseases: Secondary | ICD-10-CM | POA: Diagnosis not present

## 2017-10-30 DIAGNOSIS — Z9114 Patient's other noncompliance with medication regimen: Secondary | ICD-10-CM

## 2017-10-30 DIAGNOSIS — K7469 Other cirrhosis of liver: Secondary | ICD-10-CM | POA: Diagnosis present

## 2017-10-30 DIAGNOSIS — K766 Portal hypertension: Secondary | ICD-10-CM | POA: Diagnosis not present

## 2017-10-30 DIAGNOSIS — I714 Abdominal aortic aneurysm, without rupture: Secondary | ICD-10-CM | POA: Diagnosis present

## 2017-10-30 DIAGNOSIS — R404 Transient alteration of awareness: Secondary | ICD-10-CM | POA: Diagnosis not present

## 2017-10-30 DIAGNOSIS — K7682 Hepatic encephalopathy: Secondary | ICD-10-CM | POA: Diagnosis present

## 2017-10-30 DIAGNOSIS — G9341 Metabolic encephalopathy: Secondary | ICD-10-CM | POA: Diagnosis present

## 2017-10-30 DIAGNOSIS — Z833 Family history of diabetes mellitus: Secondary | ICD-10-CM

## 2017-10-30 DIAGNOSIS — Z9981 Dependence on supplemental oxygen: Secondary | ICD-10-CM

## 2017-10-30 DIAGNOSIS — Z7951 Long term (current) use of inhaled steroids: Secondary | ICD-10-CM

## 2017-10-30 DIAGNOSIS — M1710 Unilateral primary osteoarthritis, unspecified knee: Secondary | ICD-10-CM | POA: Diagnosis not present

## 2017-10-30 LAB — I-STAT ARTERIAL BLOOD GAS, ED
Acid-Base Excess: 6 mmol/L — ABNORMAL HIGH (ref 0.0–2.0)
Bicarbonate: 30.3 mmol/L — ABNORMAL HIGH (ref 20.0–28.0)
O2 Saturation: 97 %
Patient temperature: 98.6
TCO2: 31 mmol/L (ref 22–32)
pCO2 arterial: 40.5 mmHg (ref 32.0–48.0)
pH, Arterial: 7.481 — ABNORMAL HIGH (ref 7.350–7.450)
pO2, Arterial: 87 mmHg (ref 83.0–108.0)

## 2017-10-30 LAB — URINALYSIS, COMPLETE (UACMP) WITH MICROSCOPIC
Bacteria, UA: NONE SEEN
Bilirubin Urine: NEGATIVE
Glucose, UA: NEGATIVE mg/dL
Hgb urine dipstick: NEGATIVE
Ketones, ur: NEGATIVE mg/dL
Nitrite: NEGATIVE
Protein, ur: NEGATIVE mg/dL
Specific Gravity, Urine: 1.017 (ref 1.005–1.030)
pH: 8 (ref 5.0–8.0)

## 2017-10-30 LAB — GLUCOSE, CAPILLARY
Glucose-Capillary: 150 mg/dL — ABNORMAL HIGH (ref 65–99)
Glucose-Capillary: 152 mg/dL — ABNORMAL HIGH (ref 65–99)

## 2017-10-30 LAB — CBC WITH DIFFERENTIAL/PLATELET
Basophils Absolute: 0 10*3/uL (ref 0.0–0.1)
Basophils Relative: 0 %
Eosinophils Absolute: 0.1 10*3/uL (ref 0.0–0.7)
Eosinophils Relative: 1 %
HCT: 37.2 % (ref 36.0–46.0)
Hemoglobin: 12 g/dL (ref 12.0–15.0)
Lymphocytes Relative: 11 %
Lymphs Abs: 1 10*3/uL (ref 0.7–4.0)
MCH: 30.2 pg (ref 26.0–34.0)
MCHC: 32.3 g/dL (ref 30.0–36.0)
MCV: 93.7 fL (ref 78.0–100.0)
Monocytes Absolute: 1.2 10*3/uL — ABNORMAL HIGH (ref 0.1–1.0)
Monocytes Relative: 13 %
Neutro Abs: 6.9 10*3/uL (ref 1.7–7.7)
Neutrophils Relative %: 75 %
Platelets: 90 10*3/uL — ABNORMAL LOW (ref 150–400)
RBC: 3.97 MIL/uL (ref 3.87–5.11)
RDW: 16.8 % — ABNORMAL HIGH (ref 11.5–15.5)
WBC: 9.2 10*3/uL (ref 4.0–10.5)

## 2017-10-30 LAB — BASIC METABOLIC PANEL
Anion gap: 8 (ref 5–15)
BUN: 14 mg/dL (ref 6–20)
CO2: 28 mmol/L (ref 22–32)
Calcium: 8.4 mg/dL — ABNORMAL LOW (ref 8.9–10.3)
Chloride: 103 mmol/L (ref 101–111)
Creatinine, Ser: 0.8 mg/dL (ref 0.44–1.00)
GFR calc Af Amer: >60 mL/min (ref >60)
GFR calc non Af Amer: >60 mL/min (ref >60)
Glucose, Bld: 161 mg/dL — ABNORMAL HIGH (ref 65–99)
Potassium: 4.4 mmol/L (ref 3.5–5.1)
Sodium: 139 mmol/L (ref 135–145)

## 2017-10-30 LAB — MRSA PCR SCREENING: MRSA BY PCR: NEGATIVE

## 2017-10-30 LAB — I-STAT CG4 LACTIC ACID, ED: Lactic Acid, Venous: 1.93 mmol/L — ABNORMAL HIGH (ref 0.5–1.9)

## 2017-10-30 LAB — HEMOGLOBIN A1C
Hgb A1c MFr Bld: 6.7 % — ABNORMAL HIGH (ref 4.8–5.6)
Mean Plasma Glucose: 145.59 mg/dL

## 2017-10-30 LAB — AMMONIA: Ammonia: 116 umol/L — ABNORMAL HIGH (ref 9–35)

## 2017-10-30 MED ORDER — POLYVINYL ALCOHOL 1.4 % OP SOLN
1.0000 [drp] | OPHTHALMIC | Status: DC | PRN
  Filled 2017-10-30: qty 15

## 2017-10-30 MED ORDER — FLUTICASONE PROPIONATE 50 MCG/ACT NA SUSP
1.0000 | Freq: Every day | NASAL | Status: DC
  Administered 2017-10-31 – 2017-11-04 (×5): 1 via NASAL
  Filled 2017-10-30: qty 16

## 2017-10-30 MED ORDER — LACTULOSE 10 GM/15ML PO SOLN: 30.0000 g | Freq: Four times a day (QID) | ORAL | Status: DC

## 2017-10-30 MED ORDER — PREDNISONE 20 MG PO TABS: 10.0000 mg | ORAL_TABLET | Freq: Every day | ORAL | Status: DC

## 2017-10-30 MED ORDER — ONDANSETRON HCL 4 MG/2ML IJ SOLN: 4.0000 mg | Freq: Four times a day (QID) | INTRAMUSCULAR | Status: DC | PRN

## 2017-10-30 MED ORDER — OXYCODONE HCL 5 MG PO TABS
5.0000 mg | ORAL_TABLET | ORAL | Status: DC | PRN
  Filled 2017-10-30: qty 1

## 2017-10-30 MED ORDER — SPIRONOLACTONE 25 MG PO TABS
25.0000 mg | ORAL_TABLET | Freq: Every day | ORAL | Status: DC
  Administered 2017-11-01 – 2017-11-04 (×4): 25 mg via ORAL
  Filled 2017-10-30 (×4): qty 1

## 2017-10-30 MED ORDER — BISACODYL 10 MG RE SUPP: 10.0000 mg | Freq: Every day | RECTAL | Status: DC | PRN

## 2017-10-30 MED ORDER — SODIUM CHLORIDE 0.9 % IV SOLN
INTRAVENOUS | Status: DC
  Administered 2017-10-30: 1000 mL via INTRAVENOUS

## 2017-10-30 MED ORDER — MUSCLE RUB 10-15 % EX CREA
1.0000 "application " | TOPICAL_CREAM | Freq: Two times a day (BID) | CUTANEOUS | Status: DC
  Administered 2017-10-31 – 2017-11-04 (×7): 1 via TOPICAL
  Filled 2017-10-30: qty 85

## 2017-10-30 MED ORDER — ACETAMINOPHEN 650 MG RE SUPP: 650.0000 mg | Freq: Four times a day (QID) | RECTAL | Status: DC | PRN

## 2017-10-30 MED ORDER — LACTULOSE ENEMA
300.0000 mL | Freq: Once | ORAL | Status: DC
  Filled 2017-10-30 (×2): qty 300

## 2017-10-30 MED ORDER — SALINE SPRAY 0.65 % NA SOLN
2.0000 | Freq: Two times a day (BID) | NASAL | Status: DC | PRN
Start: 2017-10-30 — End: 2017-11-04
  Filled 2017-10-30: qty 44

## 2017-10-30 MED ORDER — INSULIN ASPART 100 UNIT/ML ~~LOC~~ SOLN
0.0000 [IU] | SUBCUTANEOUS | Status: DC
  Administered 2017-10-30: 1 [IU] via SUBCUTANEOUS
  Administered 2017-10-30: 2 [IU] via SUBCUTANEOUS
  Administered 2017-10-31: 1 [IU] via SUBCUTANEOUS
  Administered 2017-11-01: 7 [IU] via SUBCUTANEOUS
  Administered 2017-11-01 (×4): 1 [IU] via SUBCUTANEOUS
  Administered 2017-11-02 (×3): 3 [IU] via SUBCUTANEOUS
  Administered 2017-11-02 (×2): 2 [IU] via SUBCUTANEOUS
  Administered 2017-11-03: 3 [IU] via SUBCUTANEOUS
  Administered 2017-11-03: 2 [IU] via SUBCUTANEOUS
  Administered 2017-11-03: 3 [IU] via SUBCUTANEOUS
  Administered 2017-11-03: 2 [IU] via SUBCUTANEOUS
  Administered 2017-11-03 (×2): 3 [IU] via SUBCUTANEOUS
  Administered 2017-11-04 (×2): 2 [IU] via SUBCUTANEOUS
  Administered 2017-11-04: 3 [IU] via SUBCUTANEOUS
  Administered 2017-11-04: 2 [IU] via SUBCUTANEOUS
  Administered 2017-11-04: 3 [IU] via SUBCUTANEOUS

## 2017-10-30 MED ORDER — MOMETASONE FURO-FORMOTEROL FUM 100-5 MCG/ACT IN AERO
2.0000 | INHALATION_SPRAY | Freq: Two times a day (BID) | RESPIRATORY_TRACT | Status: DC
  Administered 2017-10-31 – 2017-11-04 (×9): 2 via RESPIRATORY_TRACT
  Filled 2017-10-30: qty 8.8

## 2017-10-30 MED ORDER — ALBUTEROL SULFATE (2.5 MG/3ML) 0.083% IN NEBU
2.5000 mg | INHALATION_SOLUTION | Freq: Four times a day (QID) | RESPIRATORY_TRACT | Status: DC | PRN
  Administered 2017-10-31: 2.5 mg via RESPIRATORY_TRACT
  Filled 2017-10-30: qty 3

## 2017-10-30 MED ORDER — RIFAXIMIN 550 MG PO TABS
550.0000 mg | ORAL_TABLET | Freq: Two times a day (BID) | ORAL | Status: DC
  Administered 2017-11-01 – 2017-11-04 (×7): 550 mg via ORAL
  Filled 2017-10-30 (×11): qty 1

## 2017-10-30 MED ORDER — VANCOMYCIN HCL IN DEXTROSE 1-5 GM/200ML-% IV SOLN
1000.0000 mg | Freq: Once | INTRAVENOUS | Status: AC
  Administered 2017-10-30: 1000 mg via INTRAVENOUS
  Filled 2017-10-30: qty 200

## 2017-10-30 MED ORDER — CELECOXIB 100 MG PO CAPS
100.0000 mg | ORAL_CAPSULE | Freq: Two times a day (BID) | ORAL | Status: DC | PRN
  Filled 2017-10-30: qty 1

## 2017-10-30 MED ORDER — PANTOPRAZOLE SODIUM 20 MG PO TBEC: 20.0000 mg | DELAYED_RELEASE_TABLET | Freq: Every day | ORAL | Status: DC

## 2017-10-30 MED ORDER — FUROSEMIDE 20 MG PO TABS: 80.0000 mg | ORAL_TABLET | Freq: Every day | ORAL | Status: DC

## 2017-10-30 MED ORDER — CEFEPIME HCL 1 G IJ SOLR
1.0000 g | Freq: Once | INTRAMUSCULAR | Status: AC
  Administered 2017-10-30: 1 g via INTRAVENOUS
  Filled 2017-10-30: qty 1

## 2017-10-30 MED ORDER — ACETAMINOPHEN 325 MG PO TABS
650.0000 mg | ORAL_TABLET | Freq: Four times a day (QID) | ORAL | Status: DC | PRN
  Administered 2017-11-03: 650 mg via ORAL
  Filled 2017-10-30: qty 2

## 2017-10-30 MED ORDER — ONDANSETRON HCL 4 MG PO TABS: 4.0000 mg | ORAL_TABLET | Freq: Four times a day (QID) | ORAL | Status: DC | PRN

## 2017-10-30 MED ORDER — ENOXAPARIN SODIUM 40 MG/0.4ML ~~LOC~~ SOLN
40.0000 mg | SUBCUTANEOUS | Status: DC
  Administered 2017-10-30 – 2017-11-03 (×5): 40 mg via SUBCUTANEOUS
  Filled 2017-10-30 (×5): qty 0.4

## 2017-10-30 NOTE — H&P (Signed)
History and Physical    Ashley Savage HFW:[id number] DOB: [birth year] DOA: 10/30/2017  PCP: Hennie Duos, MD  Patient coming from: [facility] facility  I have personally briefly reviewed patient's old medical records in [place]  Chief Complaint: Altered mental status sent in from facility at [facility] skilled nursing facility.  HPI: Ashley Savage is a 77 y.o. female with a history of 2L O2-dependent COPD, hepatic cirrhosis, DM, HLD who presented from her facility for unresponsiveness and obtundation.  Per EMS the patient has been refusing medications.  Unfortunately the [facility] recently received from [place] and rehab does not have on it whether or not she is received any of her medicines and simply a list of medications.  Her pharmacist had to call to request a copy of the actual [facility] but they did not feel comfortable providing that and verbally told her that she received all her medicines although we have no documentation of this.  Nursing home stated that patient was fine until this morning when they found her obtunded and unable to answer questions and they sent her in.  This H&P is a level 5 caveat due to patient being obtunded.   ED Course: Patient found to have a ammonia level of 114 will be admitted into the hospital for hepatic encephalopathy.  Review of Systems: Patient unable she is obtunded Past Medical History:  Diagnosis Date  . Abdominal aortic aneurysm ([place]) 10/01/2013   Fall of 2014 3.3 per patient, follows with Vascular surgeon.   . Acute respiratory failure with hypoxia ([place]) 07/31/2015  . Allergic state 11/10/2016  . Anxiety   . Anxiety and depression 02/01/2014  . Arthritis of both knees 10/01/2013  . Arthritis of right knee 10/01/2013   Follows with Dr Mayer Camel   . Benign paroxysmal positional vertigo 10/01/2013  . Breast cancer ([place])    No disease activity On Femara Follows with Dr Marin Olp Right mastectomy performed by Dr Autumn Messing   . Cancer  [facility]) breast ca  right  . Chronic respiratory failure ([place]) 08/29/2015  . Cirrhosis of liver without ascites ([place]) 07/31/2015  . COPD (chronic obstructive pulmonary disease) ([place]) 10/01/2013  . COPD with acute exacerbation ([place]) 05/05/2017  . Depression   . Dermatitis 03/30/2017  . Diabetes mellitus type 2  . Diabetes mellitus type 2, controlled ([place]) 10/16/2014  . Emphysema   . Encephalopathy, hepatic ([place]) 06/07/2014  . Esophageal reflux 10/01/2013  . Fall 07/30/2016  . Hyperlipidemia   . Hyperlipidemia, mixed   . Increased ammonia level 11/25/2014  . NASH (nonalcoholic steatohepatitis) 08/26/2015  . Neck pain 10/01/2013  . Neuropathy    feet   . Osteopenia 03/30/2017  . Overactive bladder 12/10/2013  . Panic attacks   . Pedal edema 12/10/2013  . Personal history of radiation therapy   . Preventative health care 03/08/2016  . Thrombocytopenia ([place]) 03/17/2012  . Tobacco abuse disorder 02/01/2014  . Type 2 diabetes mellitus with hyperglycemia, with long-term current use of insulin ([place])   . Urine frequency 04/13/2017    Past Surgical History:  Procedure Laterality Date  . APPENDECTOMY  2007  . BREAST SURGERY  2009 right  . CATARACT EXTRACTION     x 2  . ESOPHAGOGASTRODUODENOSCOPY (EGD) WITH PROPOFOL N/A 08/14/2016   Procedure: ESOPHAGOGASTRODUODENOSCOPY (EGD) WITH PROPOFOL;  Surgeon: Mauri Pole, MD;  Location: [facility] ENDOSCOPY;  Service: Endoscopy;  Laterality: N/A;  . [place]  . KNEE SURGERY    .  MANDIBLE FRACTURE SURGERY    . MASTECTOMY    . PILONIDAL CYST EXCISION    . TONSILLECTOMY       reports that she has quit smoking. Her smoking use included cigarettes. She started smoking about 53 years ago. She has a 14.50 pack-year smoking history. She has never used smokeless tobacco. She reports that she does not drink alcohol or use drugs.  Allergies  Allergen Reactions  . Citalopram Palpitations    Irregular heart beat   . Ciprofloxacin Other (See Comments)    Mental  status change   . Erythromycin Other (See Comments)    Stomach cramps   . Glimepiride Other (See Comments)    Elevated ammonia levels   . Prednisone     Increased blood sugars too high    Family History  Problem Relation Age of Onset  . Heart failure Father   . COPD Father   . Arthritis Father 59  . Stroke Mother   . Arthritis Mother 67  . Hyperlipidemia Mother   . Hypertension Mother   . Diabetes Mother   . Diabetes Sister   . Breast cancer Unknown   . Breast cancer Maternal Aunt   . Asthma Maternal Aunt   . Birth defects Maternal Aunt   . Alcohol abuse Maternal Uncle   . Breast cancer Maternal Aunt   . Stomach cancer Neg Hx   . Colon cancer Neg Hx     Prior to Admission medications   Medication Sig Start Date End Date Taking? Authorizing Provider  acetaminophen (TYLENOL) 325 MG tablet Take 650 mg by mouth every 6 (six) hours as needed (pain).    Yes [provider]  albuterol (ACCUNEB) 0.63 MG/3ML nebulizer solution Take 1 ampule by nebulization every 6 (six) hours as needed for shortness of breath.   Yes [provider]  bisacodyl (DULCOLAX) 10 MG suppository Place 10 mg rectally daily as needed (constipation not relieved by MOM).    Yes [provider]  celecoxib (CELEBREX) 100 MG capsule Take 100 mg by mouth every 8 (eight) hours as needed (pain).    Yes [provider]  fluticasone (FLONASE ALLERGY RELIEF) 50 MCG/ACT nasal spray Place 1 spray into both nostrils daily.   Yes [provider]  furosemide (LASIX) 80 MG tablet Take 80 mg by mouth daily.   Yes [provider]  insulin aspart (NOVOLOG) 100 UNIT/ML FlexPen Inject 0-15 Units into the skin 3 (three) times daily with meals. Inject 0-15 units subcutaneously three times daily before meals and at bedtime per sliding scale: CBG 70-120 0 units, 251-150 2 units, 151-200 3 units, 201-250 5 units, 251-300 8 units, 301-350 11 units, 351-400 15 units, >400 call MD and  give 15 units   Yes [provider]  insulin detemir (LEVEMIR) 100 UNIT/ML injection Inject 0.12 mLs (12 Units total) into the skin 2 (two) times daily. 10/22/17  Yes Rosita Fire, MD  lactulose (CHRONULAC) 10 GM/15ML solution Take 45 mLs (30 g total) by mouth every 6 (six) hours. 10/22/17  Yes Rosita Fire, MD  LORazepam (ATIVAN) 0.5 MG tablet Take 1 tablet (0.5 mg total) by mouth every 6 (six) hours as needed for anxiety. 10/22/17  Yes Rosita Fire, MD  Magnesium Hydroxide (MILK OF MAGNESIA PO) Take 30 mLs by mouth daily as needed (constipation).   Yes [provider]  magnesium oxide (MAG-OX) 400 MG tablet Take 400 mg by mouth 2 (two) times daily.   Yes [provider]  Menthol, Topical Analgesic, (BIOFREEZE) 4 % GEL Apply 1 application topically 2 (two) times daily. For arthritic knee pain   Yes [provider]  mometasone-formoterol (DULERA) 100-5 MCG/ACT AERO Inhale 2 puffs into the lungs 2 (two) times daily.   Yes [provider]  OXYGEN Inhale 2 L into the lungs continuous.   Yes [provider]  pantoprazole (PROTONIX) 20 MG tablet Take 20 mg by mouth daily.   Yes [provider]  polyvinyl alcohol (LUBRICANT DROPS) 1.4 % ophthalmic solution Place 1 drop into both eyes as needed (dry eyes). Patient taking differently: Place 1 drop into both eyes daily as needed.  05/11/17  Yes Hosie Poisson, MD  potassium chloride (K-DUR) 10 MEQ tablet Take 10 mEq by mouth daily.   Yes [provider]  Pramox-PE-Glycerin-Petrolatum 1-0.25-14.4-15 % CREA Place 1 application rectally daily as needed (rectal itching).   Yes [provider]  rifaximin (XIFAXAN) 550 MG TABS tablet Take 550 mg by mouth 2 (two) times daily.   Yes [provider]  sodium chloride (OCEAN) 0.65 % SOLN nasal spray Place 2 sprays into both nostrils 2 (two) times daily as needed for congestion.   Yes [provider]    Sodium Phosphates (FLEET ENEMA RE) Place 1 each rectally daily as needed (constipation not relieved by MOM or bisacodyl).   Yes [provider]  spironolactone (ALDACTONE) 25 MG tablet Take 25 mg by mouth daily.   Yes [provider]  amoxicillin-clavulanate (AUGMENTIN) 500-125 MG tablet Take 1 tablet by mouth 2 (two) times daily. 3 day course completed 10/25/17 am    [provider]  predniSONE (DELTASONE) 10 MG tablet Take 1 tablet (10 mg total) by mouth daily. Take 30 mg for 2 days, 20 mg for 2 days, 10 mg for 2 days and then stop. Patient taking differently: Take 10-30 mg by mouth daily. Take  3 tablets (30 mg) by mouth daily for 2 days, then take 2 tablets (20 mg) daily for 2 days, then take 1 tablet (10 mg) for 2 days, then stop. 10/22/17   Rosita Fire, MD    Physical Exam: Vitals:   10/30/17 1530 10/30/17 1545 10/30/17 1615 10/30/17 1630  BP: (!) 110/55 (!) 109/48 111/60 108/61  Pulse: 83 82 79 79  Resp: 17 18 16 19   Temp:      TempSrc:      SpO2: 99% 99% 100% 100%    Constitutional: Obtunded and unresponsive to stimulation Vitals:   10/30/17 1530 10/30/17 1545 10/30/17 1615 10/30/17 1630  BP: (!) 110/55 (!) 109/48 111/60 108/61  Pulse: 83 82 79 79  Resp: 17 18 16 19   Temp:      TempSrc:      SpO2: 99% 99% 100% 100%   Eyes: PERRL, lids and conjunctivae normal ENMT: Mucous membranes are dry as patient is mouth breathing. Posterior pharynx clear of any exudate or lesions.Normal dentition.  Neck: normal, supple, no masses, no thyromegaly Respiratory: clear to auscultation bilaterally, no wheezing, no crackles. Normal respiratory effort. No accessory muscle use.  Cardiovascular: Regular rate and rhythm, no murmurs / rubs / gallops. No extremity edema. 2+ pedal pulses. No carotid bruits.  Abdomen: no tenderness, no masses palpated. No hepatosplenomegaly. Bowel sounds positive.  Musculoskeletal: no clubbing / cyanosis. No joint deformity upper  and lower extremities. Good ROM, no contractures. Normal muscle tone.  Skin: no rashes, lesions, ulcers. No induration Neurologic: Reflexes intact does not follow commands Psychiatric:  Unable to evaluate due to level 5 caveat   Labs on Admission: I have personally reviewed following labs and imaging studies  CBC: Recent Labs  Lab 10/30/17 1320  WBC 9.2  NEUTROABS 6.9  HGB 12.0  HCT 37.2  MCV 93.7  PLT 90*   Basic Metabolic Panel: Recent Labs  Lab 10/30/17 1320  NA 139  K 4.4  CL 103  CO2 28  GLUCOSE 161*  BUN 14  CREATININE 0.80  CALCIUM 8.4*   GFR: Estimated Creatinine Clearance: 64 mL/min (by C-G formula based on SCr of 0.8 mg/dL).  Recent Labs  Lab 10/30/17 1320  AMMONIA 116*   BNP (last 3 results) Recent Labs    05/26/17 1438 07/14/17 1208  PROBNP 231.0* 265.0*   Urine analysis:    Component Value Date/Time   COLORURINE YELLOW 10/30/2017 1316   APPEARANCEUR CLEAR 10/30/2017 1316   LABSPEC 1.017 10/30/2017 1316   PHURINE 8.0 10/30/2017 1316   GLUCOSEU NEGATIVE 10/30/2017 1316   GLUCOSEU 250 (A) 04/14/2017 1108   HGBUR NEGATIVE 10/30/2017 1316   BILIRUBINUR NEGATIVE 10/30/2017 1316   BILIRUBINUR neg 12/02/2016 1508   KETONESUR NEGATIVE 10/30/2017 1316   PROTEINUR NEGATIVE 10/30/2017 1316   UROBILINOGEN 0.2 04/14/2017 1108   NITRITE NEGATIVE 10/30/2017 1316   LEUKOCYTESUR MODERATE (A) 10/30/2017 1316    Radiological Exams on Admission: Ct Head Wo Contrast  Result Date: 10/30/2017 CLINICAL DATA:  Acute presentation with altered mental status. Appended. Garbled speech. EXAM: CT HEAD WITHOUT CONTRAST TECHNIQUE: Contiguous axial images were obtained from the base of the skull through the vertex without intravenous contrast. COMPARISON:  08/31/2017.  08/12/2017.  11/25/2016. FINDINGS: Brain: Chronic atrophy and ventriculomegaly, stable over time. No sign of acute infarction, mass lesion, hemorrhage, hydrocephalus or extra-axial collection. Study does  suffer from motion degradation. Vascular: There is atherosclerotic calcification of the major vessels at the base of the brain. Skull: Negative Sinuses/Orbits: Clear/normal Other: None IMPRESSION: No acute finding by CT.  Atrophy and chronic ventriculomegaly. Electronically Signed   By: Nelson Chimes M.D.   On: 10/30/2017 14:51   Dg Chest Portable 1 View  Result Date: 10/30/2017 CLINICAL DATA:  77 year old female with altered mental status EXAM: PORTABLE CHEST 1 VIEW COMPARISON:  Prior chest x-ray 10/20/2017 FINDINGS: Focal patchy airspace opacification in the periphery of the right upper lobe is new compared to prior and concerning for bronchopneumonia. Stable mild cardiomegaly. Atherosclerotic calcifications are again present in the thoracic aorta. Diffuse bronchitic changes and interstitial prominence are stable. No pneumothorax or pleural effusion. No acute osseous abnormality. IMPRESSION: Suspect right upper lobe bronchopneumonia. Electronically Signed   By: Jacqulynn Cadet M.D.   On: 10/30/2017 13:12    EKG: Independently reviewed.  Sinus rhythm with PACs.  Assessment/Plan Principal Problem:   Hepatic encephalopathy (HCC) Active Problems:   Cirrhosis of liver without ascites (HCC)   Abnormal urinalysis   Right upper lobe pneumonia (HCC)   Type 2 diabetes mellitus with hyperglycemia, with long-term current use of insulin (HCC)   Liver cirrhosis secondary to NASH (HCC)   Obstructive chronic bronchitis without exacerbation COPD gold stage C.   Portal hypertensive gastropathy ([place])   1.  Hepatic encephalopathy: Patient with an extraordinarily high ammonia level.  According to nursing staff she just became obtunded this morning.  They state that she has been taking all of her medicines although the [facility] is not available for [place] to review.  She will be given a rectal enema of lactulose.  I have ordered oral  lactulose with a note stating to give it rectally if the patient unable to tolerate it  orally.  We will hydrate the patient as well.  We will carefully monitor her fluid volume status.  2.  Cirrhosis of liver without ascites: Patient clearly with decompensated cirrhosis based on hepatic encephalopathy doubt she is a candidate for any other additional treatments.  3.  Abnormal urinalysis: Patient possibly has a urinary tract infection.  She was given a dose of vancomycin and cefepime likely ordered for her potential pneumonia which should definitely cover her urinary situation.  Will await a urine culture pending in the lab.  4.  Right upper lobe pneumonia: This is a probable diagnosis.  Chest x-ray was not definitive but suggestive.  Patient has been started on vancomycin and cefepime.  Further diagnostic clarity is desired we could proceed with a noncontrast CT scan of the chest.  But at this point will just hold off and continue current management.  Hopefully patient's mentation will improve and CT scan will be unnecessary.  5.  Type 2 diabetes mellitus with hyperglycemia with long-term concurrent use of insulin: Continue home medication regimen will hold any long-acting insulins will do sliding scale as patient currently not able to eat.  6.  Liver cirrhosis secondary to nonalcoholic steatohepatitis: Noted continue management.  Spironolactone is resumed.  7.  Obstructive chronic bronchitis without exacerbation COPD Gold stage C: Continue nebulizers as at home.  No need for additional steroids at this point patient is compensated.  8.  Portal hypertensive gastropathy: This is noted no evidence of bleeding at this point.  DVT prophylaxis: Lovenox Code Status: DO NOT RESUSCITATE Family Communication: No family available for discussion. Disposition Plan: Likely back to [facility] in 3 or 4 days Consults called: None Admission status: Inpatient   Lady Deutscher MD [place] [facility] Pager [phone]  If 7PM-7AM, please contact  night-coverage [email or web address] Password [facility]  10/30/2017, 4:44 PM

## 2017-10-30 NOTE — ED Triage Notes (Signed)
Pt to ER for evaluation of AMS onset unknown. Staff at [facility] report they "found her like this at 7 am." Pt obtunded when not stimulated. When stimulated, speech is garbled and unintelligible. Hx of liver failure, staff report noncompliance with medications. IV established by [facility] 20 g in RH. Pt chronically wears 2L nasal cannula, airway and pulse maintained at this time.

## 2017-10-30 NOTE — ED Provider Notes (Signed)
[facility] EMERGENCY DEPARTMENT Provider Note   CSN: [id number] Arrival date & time: 10/30/17  1149     History   Chief Complaint Chief Complaint  Patient presents with  . Altered Mental Status    HPI Ashley Savage is a 77 y.o. female.  HPI   77 year old female coming from nursing facility for change in her mental status.  Apparently found by staff very confused this morning.  Last known normal sometime before she went to sleep last night.  Patient herself is encephalopathic and unable to provide any useful history.  Apparently she is noncompliant with medications although she is in the skilled nursing facility.  Baseline supplemental oxygen use at 2 L/min.  Past Medical History:  Diagnosis Date  . Abdominal aortic aneurysm ([place]) 10/01/2013   Fall of 2014 3.3 per patient, follows with Vascular surgeon.   . Acute respiratory failure with hypoxia ([place]) 07/31/2015  . Allergic state 11/10/2016  . Anxiety   . Anxiety and depression 02/01/2014  . Arthritis of both knees 10/01/2013  . Arthritis of right knee 10/01/2013   Follows with Dr Mayer Camel   . Benign paroxysmal positional vertigo 10/01/2013  . Breast cancer ([place])    No disease activity On Femara Follows with Dr Marin Olp Right mastectomy performed by Dr Autumn Messing   . Cancer [facility]) breast ca  right  . Chronic respiratory failure ([place]) 08/29/2015  . Cirrhosis of liver without ascites ([place]) 07/31/2015  . COPD (chronic obstructive pulmonary disease) ([place]) 10/01/2013  . COPD with acute exacerbation ([place]) 05/05/2017  . Depression   . Dermatitis 03/30/2017  . Diabetes mellitus type 2  . Diabetes mellitus type 2, controlled ([place]) 10/16/2014  . Emphysema   . Encephalopathy, hepatic ([place]) 06/07/2014  . Esophageal reflux 10/01/2013  . Fall 07/30/2016  . Hyperlipidemia   . Hyperlipidemia, mixed   . Increased ammonia level 11/25/2014  . NASH (nonalcoholic steatohepatitis) 08/26/2015  . Neck pain 10/01/2013  . Neuropathy    feet   . Osteopenia  03/30/2017  . Overactive bladder 12/10/2013  . Panic attacks   . Pedal edema 12/10/2013  . Personal history of radiation therapy   . Preventative health care 03/08/2016  . Thrombocytopenia ([place]) 03/17/2012  . Tobacco abuse disorder 02/01/2014  . Type 2 diabetes mellitus with hyperglycemia, with long-term current use of insulin ([place])   . Urine frequency 04/13/2017    Patient Active Problem List   Diagnosis Date Noted  . Acute respiratory failure with hypoxia and hypercapnia ([place]) 10/26/2017  . Acute pulmonary edema ([place]) 10/26/2017  . Acute metabolic encephalopathy 46/56/8127  . Liver cirrhosis secondary to NASH ([place]) 10/26/2017  . Sepsis ([place])   . Acute on chronic respiratory failure with hypoxia ([place]) 10/18/2017  . Leg edema, left 10/02/2017  . Arthritis 10/02/2017  . COPD exacerbation ([place]) 06/25/2017  . Ventricular tachycardia ([place]) 05/22/2017  . Acute kidney injury ([place]) 05/17/2017  . Hyperkalemia 05/17/2017  . Vitamin D deficiency 05/17/2017  . COPD with acute exacerbation ([place]) 05/05/2017  . Urine frequency 04/13/2017  . Osteopenia 03/30/2017  . Dermatitis 03/30/2017  . Allergic state 11/10/2016  . 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 Portal hypertensive gastropathy ([place])   . Lower back injury, initial encounter 08/05/2016  . Fall 07/30/2016  . Preventative health care 03/08/2016  . Muscle spasm 02/25/2016  . Chronic respiratory failure ([place]) 08/29/2015  . NASH (nonalcoholic steatohepatitis) 08/26/2015  . Type 2 diabetes mellitus with hyperglycemia, with long-term current use of  insulin ([place])   . Cirrhosis of liver without ascites ([place]) 07/31/2015  . Acute respiratory failure with hypoxia ([place]) 07/31/2015  . Diarrhea 12/30/2014  . Increased ammonia level 11/25/2014  . Diabetes mellitus type 2, controlled ([place]) 10/16/2014  . Encephalopathy, hepatic ([place]) 06/07/2014  . Right knee pain 06/07/2014  . Sun-damaged skin 02/01/2014  . Anxiety and depression 02/01/2014  .  Tobacco abuse 02/01/2014  . Medicare annual wellness visit, subsequent 02/01/2014  . Pedal edema 12/10/2013  . Overactive bladder 12/10/2013  . Abdominal aortic aneurysm ([place]) 10/01/2013  . Arthritis of right knee 10/01/2013  . Benign paroxysmal positional vertigo 10/01/2013  . Neck pain 10/01/2013  . Esophageal reflux 10/01/2013  . Thrombocytopenia ([place]) 03/17/2012  . 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   . Hyperlipidemia, mixed   . Breast cancer [facility])     Past Surgical History:  Procedure Laterality Date  . APPENDECTOMY  2007  . BREAST SURGERY  2009 right  . CATARACT EXTRACTION     x 2  . ESOPHAGOGASTRODUODENOSCOPY (EGD) WITH PROPOFOL N/A 08/14/2016   Procedure: ESOPHAGOGASTRODUODENOSCOPY (EGD) WITH PROPOFOL;  Surgeon: Mauri Pole, MD;  Location: [facility] ENDOSCOPY;  Service: Endoscopy;  Laterality: N/A;  . [place]  . KNEE SURGERY    . MANDIBLE FRACTURE SURGERY    . MASTECTOMY    . PILONIDAL CYST EXCISION    . TONSILLECTOMY       OB History   None      Home Medications    Prior to Admission medications   Medication Sig Start Date End Date Taking? Authorizing Provider  acetaminophen (TYLENOL) 325 MG tablet Take 650 mg by mouth every 6 (six) hours as needed for mild pain or moderate pain.    [provider]  albuterol (ACCUNEB) 0.63 MG/3ML nebulizer solution Take 1 ampule by nebulization every 6 (six) hours as needed for shortness of breath.    [provider]  bisacodyl (DULCOLAX) 10 MG suppository Place 10 mg rectally daily as needed for moderate constipation.    [provider]  celecoxib (CELEBREX) 100 MG capsule Take 100 mg by mouth every 8 (eight) hours as needed for mild pain.    [provider]  fluticasone (FLONASE ALLERGY RELIEF) 50 MCG/ACT nasal spray Place 1 spray into both nostrils daily.    [provider]  furosemide (LASIX) 80 MG tablet Take 80 mg by mouth  daily.    [provider]  hydrocortisone cream 1 % Apply 1 application topically daily as needed for itching. Apply rectally as needed    [provider]  insulin aspart (NOVOLOG) 100 UNIT/ML FlexPen Inject 0-15 Units into the skin 3 (three) times daily with meals. CBG ac & hs with SSI 70 -120= 0 units ,121-150=2units,151-200=3units,201-250=5units,251-300=8 units,301-350=11units,351-400=15units greater than 400 call MD and give 15units    [provider]  insulin detemir (LEVEMIR) 100 UNIT/ML injection Inject 0.12 mLs (12 Units total) into the skin 2 (two) times daily. 10/22/17   Rosita Fire, MD  lactulose (CHRONULAC) 10 GM/15ML solution Take 45 mLs (30 g total) by mouth every 6 (six) hours. 10/22/17   Rosita Fire, MD  LORazepam (ATIVAN) 0.5 MG tablet Take 1 tablet (0.5 mg total) by mouth every 6 (six) hours as needed for anxiety. 10/22/17   Rosita Fire, MD  magnesium hydroxide (MILK OF MAGNESIA) 400 MG/5ML suspension Take 30 mLs by mouth daily as needed for mild constipation.    [provider]  magnesium oxide (MAG-OX) 400 MG tablet Take 400 mg by mouth 2 (two) times daily.    [provider]  Menthol, Topical Analgesic, (BIOFREEZE) 4 % GEL Apply 1 application topically 2 (two) times daily. Both knees    [provider]  mometasone-formoterol (DULERA) 100-5 MCG/ACT AERO Inhale 2 puffs into the lungs 2 (two) times daily.    [provider]  pantoprazole (PROTONIX) 20 MG tablet Take 20 mg by mouth daily.    [provider]  polyvinyl alcohol (LUBRICANT DROPS) 1.4 % ophthalmic solution Place 1 drop into both eyes as needed (dry eyes). 05/11/17   Hosie Poisson, MD  potassium chloride (K-DUR) 10 MEQ tablet Take 10 mEq by mouth daily.    [provider]  predniSONE (DELTASONE) 10 MG tablet Take 1 tablet (10 mg total) by mouth daily. Take 30 mg for 2 days, 20 mg for 2 days, 10 mg for 2 days and then  stop. 10/22/17   Rosita Fire, MD  rifaximin (XIFAXAN) 550 MG TABS tablet Take 550 mg by mouth 2 (two) times daily.    [provider]  sodium chloride (OCEAN) 0.65 % SOLN nasal spray Place 2 sprays into both nostrils 2 (two) times daily as needed for congestion.    [provider]  Sodium Phosphates (RA SALINE ENEMA) 19-7 GM/118ML ENEM Place 1 application rectally daily as needed. Constipation    [provider]  spironolactone (ALDACTONE) 25 MG tablet Take 25 mg by mouth daily.    [provider]    Family History Family History  Problem Relation Age of Onset  . Heart failure Father   . COPD Father   . Arthritis Father 55  . Stroke Mother   . Arthritis Mother 16  . Hyperlipidemia Mother   . Hypertension Mother   . Diabetes Mother   . Diabetes Sister   . Breast cancer Unknown   . Breast cancer Maternal Aunt   . Asthma Maternal Aunt   . Birth defects Maternal Aunt   . Alcohol abuse Maternal Uncle   . Breast cancer Maternal Aunt   . Stomach cancer Neg Hx   . Colon cancer Neg Hx     Social History Social History   Tobacco Use  . Smoking status: Former Smoker    Packs/day: 0.25    Years: 58.00    Pack years: 14.50    Types: Cigarettes    Start date: 07/27/1964  . Smokeless tobacco: Never Used  Substance Use Topics  . Alcohol use: No    Alcohol/week: 0.0 oz  . Drug use: No     Allergies   Citalopram; Ciprofloxacin; Erythromycin; Glimepiride; and Prednisone   Review of Systems Review of Systems  Level 5 caveat because of confusion.  Physical Exam Updated Vital Signs BP 135/67   Pulse 81   Temp (!) 96.7 F (35.9 C) (Rectal)   Resp 17   LMP  (LMP Unknown)   SpO2 99%   Physical Exam  Constitutional: She appears well-developed.  HENT:  Head: Normocephalic and atraumatic.  Eyes: Conjunctivae are normal. Right eye exhibits no discharge. Left eye exhibits no discharge.  Neck: Neck supple.  Cardiovascular: Normal  rate, regular rhythm and normal heart sounds. Exam reveals no gallop and no friction rub.  No murmur heard. Pulmonary/Chest: No respiratory distress.  Tachypnea.   Abdominal: Soft. She exhibits no distension. There is no tenderness.  Musculoskeletal: She exhibits edema. She exhibits no tenderness.  Symmetric pitting lower  extremity edema.  Skin changes to bilateral shins consistent with stasis dermatitis.  Neurological:  Drowsy.  Will grunt with stimuli.  No discernible speech.  We will draw all extremities to pain but does not follow commands.  Skin: Skin is warm and dry.  Nursing note and vitals reviewed.    ED Treatments / Results  Labs (all labs ordered are listed, but only abnormal results are displayed) Labs Reviewed  CBC WITH DIFFERENTIAL/PLATELET - Abnormal; Notable for the following components:      Result Value   RDW 16.8 (*)    Platelets 90 (*)    Monocytes Absolute 1.2 (*)    All other components within normal limits  BASIC METABOLIC PANEL - Abnormal; Notable for the following components:   Glucose, Bld 161 (*)    Calcium 8.4 (*)    All other components within normal limits  AMMONIA - Abnormal; Notable for the following components:   Ammonia 116 (*)    All other components within normal limits  URINALYSIS, COMPLETE (UACMP) WITH MICROSCOPIC - Abnormal; Notable for the following components:   Leukocytes, UA MODERATE (*)    Squamous Epithelial / LPF 0-5 (*)    All other components within normal limits  I-STAT ARTERIAL BLOOD GAS, ED - Abnormal; Notable for the following components:   pH, Arterial 7.481 (*)    Bicarbonate 30.3 (*)    Acid-Base Excess 6.0 (*)    All other components within normal limits  I-STAT CG4 LACTIC ACID, ED - Abnormal; Notable for the following components:   Lactic Acid, Venous 1.93 (*)    All other components within normal limits  URINE CULTURE  I-STAT CG4 LACTIC ACID, ED    EKG EKG Interpretation  Date/Time:  Saturday October 30 2017  11:56:35 EDT Ventricular Rate:  90 PR Interval:    QRS Duration: 85 QT Interval:  427 QTC Calculation: 532 R Axis:   45 Text Interpretation:  Sinus rhythm Atrial premature complexes Nonspecific repol abnormality, diffuse leads Confirmed by Virgel Manifold [phone]) on 10/30/2017 12:24:21 PM   Radiology Ct Head Wo Contrast  Result Date: 10/30/2017 CLINICAL DATA:  Acute presentation with altered mental status. Appended. Garbled speech. EXAM: CT HEAD WITHOUT CONTRAST TECHNIQUE: Contiguous axial images were obtained from the base of the skull through the vertex without intravenous contrast. COMPARISON:  08/31/2017.  08/12/2017.  11/25/2016. FINDINGS: Brain: Chronic atrophy and ventriculomegaly, stable over time. No sign of acute infarction, mass lesion, hemorrhage, hydrocephalus or extra-axial collection. Study does suffer from motion degradation. Vascular: There is atherosclerotic calcification of the major vessels at the base of the brain. Skull: Negative Sinuses/Orbits: Clear/normal Other: None IMPRESSION: No acute finding by CT.  Atrophy and chronic ventriculomegaly. Electronically Signed   By: Nelson Chimes M.D.   On: 10/30/2017 14:51   Dg Chest Portable 1 View  Result Date: 10/30/2017 CLINICAL DATA:  77 year old female with altered mental status EXAM: PORTABLE CHEST 1 VIEW COMPARISON:  Prior chest x-ray 10/20/2017 FINDINGS: Focal patchy airspace opacification in the periphery of the right upper lobe is new compared to prior and concerning for bronchopneumonia. Stable mild cardiomegaly. Atherosclerotic calcifications are again present in the thoracic aorta. Diffuse bronchitic changes and interstitial prominence are stable. No pneumothorax or pleural effusion. No acute osseous abnormality. IMPRESSION: Suspect right upper lobe bronchopneumonia. Electronically Signed   By: Jacqulynn Cadet M.D.   On: 10/30/2017 13:12    Procedures Procedures (including critical care time)  Medications Ordered in  ED Medications  ceFEPIme (MAXIPIME) 1 g  in sodium chloride 0.9 % 100 mL IVPB (has no administration in time range)  vancomycin (VANCOCIN) IVPB 1000 mg/200 mL premix (has no administration in time range)     Initial Impression / Assessment and Plan / ED Course  I have reviewed the triage vital signs and the nursing notes.  Pertinent labs & imaging results that were available during my care of the patient were reviewed by me and considered in my medical decision making (see chart for details).     77 year old female with altered mental status.  Likely hepatic encephalopathy.  Additionally, UA concerning for possible UTI.  Chest x-ray noted.  Antibiotics should cover for possible HCAP as well as UTI.  She is afebrile though.  No leukocytosis.  Despite her underlying lung disease she appears comfortable from a respiratory standpoint and she is not hypoxic.  Rectal lactulose as pt is too drowsy to take orally. Wouldn't be able to take PO abx in this state either. Will admit.   Final Clinical Impressions(s) / ED Diagnoses   Final diagnoses:  Hepatic encephalopathy ([place])  Urinary tract infection without hematuria, site unspecified    ED Discharge Orders    None       Virgel Manifold, MD 10/30/17 1539

## 2017-10-31 DIAGNOSIS — J449 Chronic obstructive pulmonary disease, unspecified: Secondary | ICD-10-CM

## 2017-10-31 LAB — URINE CULTURE: Culture: 50000 — AB

## 2017-10-31 LAB — BASIC METABOLIC PANEL
Anion gap: 7 (ref 5–15)
BUN: 17 mg/dL (ref 6–20)
CALCIUM: 8.2 mg/dL — AB (ref 8.9–10.3)
CO2: 25 mmol/L (ref 22–32)
Chloride: 107 mmol/L (ref 101–111)
Creatinine, Ser: 0.75 mg/dL (ref 0.44–1.00)
GFR calc non Af Amer: >60 mL/min (ref >60)
GLUCOSE: 115 mg/dL — AB (ref 65–99)
Potassium: 4.6 mmol/L (ref 3.5–5.1)
Sodium: 139 mmol/L (ref 135–145)

## 2017-10-31 LAB — HEPATIC FUNCTION PANEL
ALBUMIN: 1.9 g/dL — AB (ref 3.5–5.0)
ALT: 38 U/L (ref 14–54)
AST: 45 U/L — AB (ref 15–41)
Alkaline Phosphatase: 127 U/L — ABNORMAL HIGH (ref 38–126)
Bilirubin, Direct: 0.9 mg/dL — ABNORMAL HIGH (ref 0.1–0.5)
Indirect Bilirubin: 4.3 mg/dL — ABNORMAL HIGH (ref 0.3–0.9)
TOTAL PROTEIN: 4.9 g/dL — AB (ref 6.5–8.1)
Total Bilirubin: 5.2 mg/dL — ABNORMAL HIGH (ref 0.3–1.2)

## 2017-10-31 LAB — GLUCOSE, CAPILLARY
GLUCOSE-CAPILLARY: 109 mg/dL — AB (ref 65–99)
GLUCOSE-CAPILLARY: 116 mg/dL — AB (ref 65–99)
GLUCOSE-CAPILLARY: 120 mg/dL — AB (ref 65–99)
GLUCOSE-CAPILLARY: 132 mg/dL — AB (ref 65–99)
Glucose-Capillary: 107 mg/dL — ABNORMAL HIGH (ref 65–99)
Glucose-Capillary: 99 mg/dL (ref 65–99)

## 2017-10-31 LAB — PROCALCITONIN: Procalcitonin: 0.32 ng/mL

## 2017-10-31 LAB — CBC
HCT: 35.7 % — ABNORMAL LOW (ref 36.0–46.0)
Hemoglobin: 11.5 g/dL — ABNORMAL LOW (ref 12.0–15.0)
MCH: 30.3 pg (ref 26.0–34.0)
MCHC: 32.2 g/dL (ref 30.0–36.0)
MCV: 93.9 fL (ref 78.0–100.0)
PLATELETS: 103 10*3/uL — AB (ref 150–400)
RBC: 3.8 MIL/uL — AB (ref 3.87–5.11)
RDW: 17.2 % — ABNORMAL HIGH (ref 11.5–15.5)
WBC: 9.9 10*3/uL (ref 4.0–10.5)

## 2017-10-31 LAB — LACTIC ACID, PLASMA
LACTIC ACID, VENOUS: 2 mmol/L — AB (ref 0.5–1.9)
LACTIC ACID, VENOUS: 2.2 mmol/L — AB (ref 0.5–1.9)

## 2017-10-31 LAB — AMMONIA: Ammonia: 60 umol/L — ABNORMAL HIGH (ref 9–35)

## 2017-10-31 LAB — STREP PNEUMONIAE URINARY ANTIGEN: Strep Pneumo Urinary Antigen: NEGATIVE

## 2017-10-31 MED ORDER — LACTULOSE ENEMA
300.0000 mL | Freq: Every day | ORAL | Status: DC
  Administered 2017-10-31: 300 mL via RECTAL
  Filled 2017-10-31 (×2): qty 300

## 2017-10-31 MED ORDER — SODIUM CHLORIDE 0.9 % IV SOLN
INTRAVENOUS | Status: DC
  Administered 2017-10-31: 13:00:00 via INTRAVENOUS

## 2017-10-31 MED ORDER — LACTULOSE ENEMA
300.0000 mL | Freq: Two times a day (BID) | ORAL | Status: DC
  Administered 2017-10-31: 300 mL via RECTAL
  Filled 2017-10-31 (×3): qty 300

## 2017-10-31 MED ORDER — LACTULOSE ENEMA: 300.0000 mL | Freq: Every day | ORAL | Status: DC

## 2017-10-31 MED ORDER — PANTOPRAZOLE SODIUM 40 MG IV SOLR
40.0000 mg | INTRAVENOUS | Status: DC
  Administered 2017-10-31 – 2017-11-01 (×2): 40 mg via INTRAVENOUS
  Filled 2017-10-31 (×2): qty 40

## 2017-10-31 MED ORDER — SODIUM CHLORIDE 0.9 % IV SOLN
1.0000 g | Freq: Three times a day (TID) | INTRAVENOUS | Status: DC
  Administered 2017-10-31 – 2017-11-03 (×9): 1 g via INTRAVENOUS
  Filled 2017-10-31 (×10): qty 1

## 2017-10-31 MED ORDER — ALBUTEROL SULFATE (2.5 MG/3ML) 0.083% IN NEBU
2.5000 mg | INHALATION_SOLUTION | RESPIRATORY_TRACT | Status: DC
Start: 2017-10-31 — End: 2017-11-02
  Administered 2017-10-31 – 2017-11-02 (×6): 2.5 mg via RESPIRATORY_TRACT
  Filled 2017-10-31 (×6): qty 3

## 2017-10-31 NOTE — Progress Notes (Signed)
Late entry,  Due to confusion patient had difficulty retaining lactulose enema, discussed NG tube with MD however patient is unable to follow commands also making NG tube placement difficult. After another attempt enema was successful. Will continue to monitor  Critical Values Lactic acid 2.0, MD made aware and orders for NS at 77 restarted  Lactic acid 2.2, MD made aware

## 2017-10-31 NOTE — Progress Notes (Signed)
Pharmacy Antibiotic Note  ANALISSA BAYLESS is a 77 y.o. female admitted on 10/30/2017 with RUL pneumonia and AMS from [facility]. Patient was found to have elevated ammonia level and admitted for hepatic encephalopathy.  Pharmacy has been consulted for cefepime dosing.  Plan: Cefepime 1g every 8 hours Monitor clinical progression, renal function, LOT  Weight: 159 lb 9.8 oz (72.4 kg)  Temp (24hrs), Avg:97.1 F (36.2 C), Min:96.6 F (35.9 C), Max:97.9 F (36.6 C)  Recent Labs  Lab 10/30/17 1320 10/30/17 1339 10/31/17 0824  WBC 9.2  --  9.9  CREATININE 0.80  --  0.75  LATICACIDVEN  --  1.93*  --     Estimated Creatinine Clearance: 62.8 mL/min (by C-G formula based on SCr of 0.75 mg/dL).    Allergies  Allergen Reactions  . Citalopram Palpitations    Irregular heart beat   . Ciprofloxacin Other (See Comments)    Mental status change   . Erythromycin Other (See Comments)    Stomach cramps   . Glimepiride Other (See Comments)    Elevated ammonia levels   . Prednisone     Increased blood sugars too high    Antimicrobials this admission: Cefepime 4/7 >  Microbiology results: Blood Cx x2 sent Urine Cx 4/6 > MRSA PCR neg  Thank you for allowing pharmacy to be a part of this patient's care.   Diana L. Kyung Rudd, PharmD, [place] PGY1 Pharmacy Resident Pager: [phone]

## 2017-10-31 NOTE — Progress Notes (Signed)
PROGRESS NOTE  Ashley Savage ERX:[id number] DOB: 09-16-1940 DOA: 10/30/2017 PCP: Hennie Duos, MD  HPI/Recap of past 24 hours: Ashley Savage a 77 y.o.femalewitha history of 2L O2-dependent COPD, hepatic cirrhosis, DM, HLD who presented from her facility for unresponsiveness and obtundation.  Per EMS the patient has been refusing medications. Nursing home stated that patient was fine until the morning prior to admission, when they found her obtunded and unable to answer questions. In the ED, pt remained obtunded, ammonia was found to be elevated at 116, LA 1.93, saturating well on 2L of O2, afebrile with no leukocytosis. CT head negative. CXR showed PNA. Pt admitted for possible hepatic encephalopathy. Of note, pt was just discharged for similar condition on 10/22/17  Today, patient is still obtunded, lethargic, responds to touch, but eyes remains closed, does not follow commands.  Very difficult managing patient as she continues to somehow pull out rectal tubes, making it difficult to administer lactulose.  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   Cirrhosis of liver without ascites (HCC)   Type 2 diabetes mellitus with hyperglycemia, with long-term current use of insulin (HCC)   Portal hypertensive gastropathy (HCC)   Liver cirrhosis secondary to NASH (HCC)   Abnormal urinalysis   Right upper lobe pneumonia (HCC)  Acute hepatic/metabolic encephalopathy Still obtunded, lethargic, not following commands, HD stable Elevated ammonia 116, will repeat  T. Bili elevated at 5.2 LA mildly elevated 1.93-->2, will trend BC X 2 pending ABG: ABG showed PH 7.48, PCO2 40.5, PO2 87, bicarb 95.0, likely metabolic alkalosis  CT head negative Very difficult managing patient as she continues to somehow pull out rectal tubes, making it difficult to administer lactulose Place an  NGT for med administration, lactulose/rifaximin if not cooperating Hold diuretics, aldactone (once able to tolerate) Gentle hydration, change meds to IV, NPO  Cirrhosis of liver 2/2 NASH without ascites Decompensated, presenting with encephalopathy Management as above  ?HCAP Afebrile, no leukocytosis, although + non- productive cough LA mildly elevated 1.93-->2, will trend Procalcitonin 0.32 CXR: Suspect right upper lobe bronchopneumonia Order speech evaluation once able to follow commands Continue Cefepime, MRSA swab neg  ?UTI UA with mod leukocytes, TNTC WBC UC pending Continue cefepime  Chronic hypoxic respiratory failure/COPD O2 dependent Saturating well on 2L of O2 ABG showed PH 7.48, PCO2 40.5, PO2 87, bicarb 30.3 CXR as above with ?PNA Continue O2, bipap prn, cefepime, duonebs  Type 2 diabetes mellitus A1c 6.7 SSI, accuchecks  HTN Stable  Thrombocytopenia  Stable, likely due to cirrhosis Monitor closely  GERD Continue Protonix   Code Status: DNR  Family Communication: None at bedside  Disposition Plan: Back to SNF once stable   Consultants:  None  Procedures:  None  Antimicrobials:  IV Cefepime  DVT prophylaxis:  Lovenox   Objective: Vitals:   10/30/17 2033 10/31/17 0507 10/31/17 0734 10/31/17 0800  BP: 129/82 (!) 152/97  (!) 114/56  Pulse: 98 (!) 133  100  Resp: 20 20    Temp:      TempSrc:      SpO2: 99% 97% 96%   Weight: 72.4 kg (159 lb 9.8 oz)       Intake/Output Summary (Last 24 hours) at 10/31/2017 1244 Last data filed at 10/31/2017 0900 Gross per 24 hour  Intake 483.75 ml  Output 100 ml  Net 383.75 ml   Filed Weights   10/30/17 1730 10/30/17 2033  Weight: 72.4 kg (159 lb 9.8 oz) 72.4 kg (159 lb 9.8 oz)    Exam:   General: Obtunded, lethargic, unable to follow commands  Cardiovascular: S1, S2 present  Respiratory: Chest clear to auscultation  Abdomen: Soft, nontender, nondistended, bowel sounds  present  Musculoskeletal: 1+ pitting edema bilaterally  Skin: Normal  Psychiatry: Unable to assess  Neuro: Unable to follow commands   Data Reviewed: CBC: Recent Labs  Lab 10/30/17 1320 10/31/17 0824  WBC 9.2 9.9  NEUTROABS 6.9  --   HGB 12.0 11.5*  HCT 37.2 35.7*  MCV 93.7 93.9  PLT 90* 681*   Basic Metabolic Panel: Recent Labs  Lab 10/30/17 1320 10/31/17 0824  NA 139 139  K 4.4 4.6  CL 103 107  CO2 28 25  GLUCOSE 161* 115*  BUN 14 17  CREATININE 0.80 0.75  CALCIUM 8.4* 8.2*   GFR: Estimated Creatinine Clearance: 62.8 mL/min (by C-G formula based on SCr of 0.75 mg/dL). Liver Function Tests: Recent Labs  Lab 10/31/17 1038  AST 45*  ALT 38  ALKPHOS 127*  BILITOT 5.2*  PROT 4.9*  ALBUMIN 1.9*   No results for input(s): LIPASE, AMYLASE in the last 168 hours. Recent Labs  Lab 10/30/17 1320  AMMONIA 116*   Coagulation Profile: No results for input(s): INR, PROTIME in the last 168 hours. Cardiac Enzymes: No results for input(s): CKTOTAL, CKMB, CKMBINDEX, TROPONINI in the last 168 hours. BNP (last 3 results) Recent Labs    05/26/17 1438 07/14/17 1208  PROBNP 231.0* 265.0*   HbA1C: Recent Labs    10/30/17 1756  HGBA1C 6.7*   CBG: Recent Labs  Lab 10/30/17 2024 10/31/17 0037 10/31/17 0541 10/31/17 0722 10/31/17 1131  GLUCAP 152* 116* 107* 99 120*   Lipid Profile: No results for input(s): CHOL, HDL, LDLCALC, TRIG, CHOLHDL, LDLDIRECT in the last 72 hours. Thyroid Function Tests: No results for input(s): TSH, T4TOTAL, FREET4, T3FREE, THYROIDAB in the last 72 hours. Anemia Panel: No results for input(s): VITAMINB12, FOLATE, FERRITIN, TIBC, IRON, RETICCTPCT in the last 72 hours. Urine analysis:    Component Value Date/Time   COLORURINE YELLOW 10/30/2017 1316   APPEARANCEUR CLEAR 10/30/2017 1316   LABSPEC 1.017 10/30/2017 1316   PHURINE 8.0 10/30/2017 1316   GLUCOSEU NEGATIVE 10/30/2017 1316   GLUCOSEU 250 (A) 04/14/2017 1108   HGBUR  NEGATIVE 10/30/2017 1316   BILIRUBINUR NEGATIVE 10/30/2017 1316   BILIRUBINUR neg 12/02/2016 1508   KETONESUR NEGATIVE 10/30/2017 1316   PROTEINUR NEGATIVE 10/30/2017 1316   UROBILINOGEN 0.2 04/14/2017 1108   NITRITE NEGATIVE 10/30/2017 1316   LEUKOCYTESUR MODERATE (A) 10/30/2017 1316   Sepsis Labs: @LABRCNTIP (procalcitonin:4,lacticidven:4)  ) Recent Results (from the past 240 hour(s))  Urine culture     Status: None (Preliminary result)   Collection Time: 10/30/17  1:17 PM  Result Value Ref Range Status   Specimen Description URINE, CATHETERIZED  Final   Special Requests NONE  Final   Culture   Final    CULTURE REINCUBATED FOR BETTER GROWTH Performed at [facility], [place] [address] [place], [place] [address]    Report Status PENDING  Incomplete  MRSA PCR Screening     Status: None   Collection Time: 10/30/17  8:40 PM  Result Value Ref Range Status   MRSA by PCR NEGATIVE NEGATIVE Final    Comment:        The GeneXpert MRSA Assay (FDA approved for NASAL specimens only), is one component of a comprehensive MRSA colonization surveillance program.  It is not intended to diagnose MRSA infection nor to guide or monitor treatment for MRSA infections. Performed at [facility], [place] [address] [place], [place] [address]       Studies: Ct Head Wo Contrast  Result Date: 10/30/2017 CLINICAL DATA:  Acute presentation with altered mental status. Appended. Garbled speech. EXAM: CT HEAD WITHOUT CONTRAST TECHNIQUE: Contiguous axial images were obtained from the base of the skull through the vertex without intravenous contrast. COMPARISON:  08/31/2017.  08/12/2017.  11/25/2016. FINDINGS: Brain: Chronic atrophy and ventriculomegaly, stable over time. No sign of acute infarction, mass lesion, hemorrhage, hydrocephalus or extra-axial collection. Study does suffer from motion degradation. Vascular: There is atherosclerotic calcification of the major vessels at the base of the  brain. Skull: Negative Sinuses/Orbits: Clear/normal Other: None IMPRESSION: No acute finding by CT.  Atrophy and chronic ventriculomegaly. Electronically Signed   By: Nelson Chimes M.D.   On: 10/30/2017 14:51   Dg Chest Portable 1 View  Result Date: 10/30/2017 CLINICAL DATA:  77 year old female with altered mental status EXAM: PORTABLE CHEST 1 VIEW COMPARISON:  Prior chest x-ray 10/20/2017 FINDINGS: Focal patchy airspace opacification in the periphery of the right upper lobe is new compared to prior and concerning for bronchopneumonia. Stable mild cardiomegaly. Atherosclerotic calcifications are again present in the thoracic aorta. Diffuse bronchitic changes and interstitial prominence are stable. No pneumothorax or pleural effusion. No acute osseous abnormality. IMPRESSION: Suspect right upper lobe bronchopneumonia. Electronically Signed   By: Jacqulynn Cadet M.D.   On: 10/30/2017 13:12    Scheduled Meds: . enoxaparin (LOVENOX) injection  40 mg Subcutaneous Q24H  . fluticasone  1 spray Each Nare Daily  . furosemide  80 mg Oral Daily  . insulin aspart  0-9 Units Subcutaneous Q4H  . lactulose  300 mL Rectal Once  . lactulose  300 mL Rectal Daily  . mometasone-formoterol  2 puff Inhalation BID  . MUSCLE RUB  1 application Topical BID  . pantoprazole  20 mg Oral Daily  . rifaximin  550 mg Oral BID  . spironolactone  25 mg Oral Daily    Continuous Infusions: . sodium chloride    . ceFEPime (MAXIPIME) IV       LOS: 1 day     Alma Friendly, MD [facility]   If 7PM-7AM, please contact night-coverage [email or web address] Password [facility] 10/31/2017, 12:44 PM

## 2017-11-01 ENCOUNTER — Inpatient Hospital Stay (HOSPITAL_COMMUNITY): Payer: PPO

## 2017-11-01 LAB — CBC WITH DIFFERENTIAL/PLATELET
Basophils Absolute: 0.1 10*3/uL (ref 0.0–0.1)
Basophils Relative: 1 %
Eosinophils Absolute: 0.1 10*3/uL (ref 0.0–0.7)
Eosinophils Relative: 1 %
HCT: 32.9 % — ABNORMAL LOW (ref 36.0–46.0)
HEMOGLOBIN: 10.6 g/dL — AB (ref 12.0–15.0)
LYMPHS ABS: 1.2 10*3/uL (ref 0.7–4.0)
LYMPHS PCT: 13 %
MCH: 30.5 pg (ref 26.0–34.0)
MCHC: 32.2 g/dL (ref 30.0–36.0)
MCV: 94.8 fL (ref 78.0–100.0)
MONOS PCT: 14 %
Monocytes Absolute: 1.3 10*3/uL — ABNORMAL HIGH (ref 0.1–1.0)
NEUTROS PCT: 71 %
Neutro Abs: 6.8 10*3/uL (ref 1.7–7.7)
Platelets: 105 10*3/uL — ABNORMAL LOW (ref 150–400)
RBC: 3.47 MIL/uL — AB (ref 3.87–5.11)
RDW: 17.6 % — ABNORMAL HIGH (ref 11.5–15.5)
WBC: 9.4 10*3/uL (ref 4.0–10.5)

## 2017-11-01 LAB — BASIC METABOLIC PANEL
Anion gap: 10 (ref 5–15)
BUN: 22 mg/dL — AB (ref 6–20)
CHLORIDE: 110 mmol/L (ref 101–111)
CO2: 23 mmol/L (ref 22–32)
CREATININE: 0.9 mg/dL (ref 0.44–1.00)
Calcium: 8.2 mg/dL — ABNORMAL LOW (ref 8.9–10.3)
GFR calc Af Amer: >60 mL/min (ref >60)
GFR calc non Af Amer: >60 mL/min (ref >60)
Glucose, Bld: 132 mg/dL — ABNORMAL HIGH (ref 65–99)
POTASSIUM: 4.2 mmol/L (ref 3.5–5.1)
SODIUM: 143 mmol/L (ref 135–145)

## 2017-11-01 LAB — GLUCOSE, CAPILLARY
GLUCOSE-CAPILLARY: 124 mg/dL — AB (ref 65–99)
Glucose-Capillary: 132 mg/dL — ABNORMAL HIGH (ref 65–99)
Glucose-Capillary: 133 mg/dL — ABNORMAL HIGH (ref 65–99)
Glucose-Capillary: 135 mg/dL — ABNORMAL HIGH (ref 65–99)
Glucose-Capillary: 242 mg/dL — ABNORMAL HIGH (ref 65–99)
Glucose-Capillary: 258 mg/dL — ABNORMAL HIGH (ref 65–99)
Glucose-Capillary: 317 mg/dL — ABNORMAL HIGH (ref 65–99)

## 2017-11-01 LAB — PROCALCITONIN: Procalcitonin: 0.46 ng/mL

## 2017-11-01 LAB — AMMONIA: Ammonia: 48 umol/L — ABNORMAL HIGH (ref 9–35)

## 2017-11-01 MED ORDER — LACTULOSE 10 GM/15ML PO SOLN
30.0000 g | Freq: Three times a day (TID) | ORAL | Status: DC
  Administered 2017-11-01 – 2017-11-03 (×6): 30 g via ORAL
  Filled 2017-11-01 (×6): qty 45

## 2017-11-01 MED ORDER — FLUCONAZOLE IN SODIUM CHLORIDE 200-0.9 MG/100ML-% IV SOLN
200.0000 mg | INTRAVENOUS | Status: DC
  Administered 2017-11-01: 200 mg via INTRAVENOUS
  Filled 2017-11-01 (×2): qty 100

## 2017-11-01 NOTE — Progress Notes (Signed)
Pt able to tell this RN her full name at this time and able to make full sentences. Pt is much more alert after Lactulose. Will continue to monitor.   Eleanora Neighbor, RN

## 2017-11-01 NOTE — Progress Notes (Signed)
Per Kami, RN, patient is able to tolerate PO medications appropriately. MD notified regarding changing lactulose from per rectum to PO. MD placed orders for lactulose PO. Kami, RN notified of this information.

## 2017-11-01 NOTE — Progress Notes (Signed)
Pharmacy Antibiotic Note  Ashley Savage is a 77 y.o. female admitted on 10/30/2017 with RUL pneumonia and AMS from [facility]. Patient was found to have elevated ammonia level and admitted for hepatic encephalopathy.  Pharmacy has been consulted to add fluconazole for uti.  Plan: Fluconazole 200mg  q24 hours Continue Cefepime 1g every 8 hours Monitor clinical progression, renal function, LOT  Weight: 159 lb 9.8 oz (72.4 kg)  Temp (24hrs), Avg:98.4 F (36.9 C), Min:97.9 F (36.6 C), Max:98.9 F (37.2 C)  Recent Labs  Lab 10/30/17 1320 10/30/17 1339 10/31/17 0824 10/31/17 1038 10/31/17 1649 11/01/17 0327  WBC 9.2  --  9.9  --   --  9.4  CREATININE 0.80  --  0.75  --   --  0.90  LATICACIDVEN  --  1.93*  --  2.0* 2.2*  --     Estimated Creatinine Clearance: 55.8 mL/min (by C-G formula based on SCr of 0.9 mg/dL).    Allergies  Allergen Reactions  . Citalopram Palpitations    Irregular heart beat   . Ciprofloxacin Other (See Comments)    Mental status change   . Erythromycin Other (See Comments)    Stomach cramps   . Glimepiride Other (See Comments)    Elevated ammonia levels   . Prednisone     Increased blood sugars too high    Antimicrobials this admission: Cefepime 4/7 > Fluconazole 4/8>>  Microbiology results: Blood Cx x2 ngtd Urine Cx 4/6 >yeast MRSA PCR neg  Thank you for allowing pharmacy to be a part of this patient's care.  Erin Hearing PharmD., BCPS Clinical Pharmacist 11/01/2017 3:11 PM

## 2017-11-01 NOTE — Evaluation (Signed)
Clinical/Bedside Swallow Evaluation Patient Details  Name: Ashley Savage MRN: [id number] Date of Birth: 1940/12/04  Today's Date: 11/01/2017 Time: SLP Start Time (ACUTE ONLY): 19 SLP Stop Time (ACUTE ONLY): 1155 SLP Time Calculation (min) (ACUTE ONLY): 25 min  Past Medical History:  Past Medical History:  Diagnosis Date  . Abdominal aortic aneurysm ([place]) 10/01/2013   Fall of 2014 3.3 per patient, follows with Vascular surgeon.   . Acute respiratory failure with hypoxia ([place]) 07/31/2015  . Allergic state 11/10/2016  . Anxiety   . Anxiety and depression 02/01/2014  . Arthritis of both knees 10/01/2013  . Arthritis of right knee 10/01/2013   Follows with Dr Mayer Camel   . Benign paroxysmal positional vertigo 10/01/2013  . Breast cancer ([place])    No disease activity On Femara Follows with Dr Marin Olp Right mastectomy performed by Dr Autumn Messing   . Cancer [facility]) breast ca  right  . Chronic respiratory failure ([place]) 08/29/2015  . Cirrhosis of liver without ascites ([place]) 07/31/2015  . COPD (chronic obstructive pulmonary disease) ([place]) 10/01/2013  . COPD with acute exacerbation ([place]) 05/05/2017  . Depression   . Dermatitis 03/30/2017  . Diabetes mellitus type 2  . Diabetes mellitus type 2, controlled ([place]) 10/16/2014  . Emphysema   . Encephalopathy, hepatic ([place]) 06/07/2014  . Esophageal reflux 10/01/2013  . Fall 07/30/2016  . Hyperlipidemia   . Hyperlipidemia, mixed   . Increased ammonia level 11/25/2014  . NASH (nonalcoholic steatohepatitis) 08/26/2015  . Neck pain 10/01/2013  . Neuropathy    feet   . Osteopenia 03/30/2017  . Overactive bladder 12/10/2013  . Panic attacks   . Pedal edema 12/10/2013  . Personal history of radiation therapy   . Preventative health care 03/08/2016  . Thrombocytopenia ([place]) 03/17/2012  . Tobacco abuse disorder 02/01/2014  . Type 2 diabetes mellitus with hyperglycemia, with long-term current use of insulin ([place])   . Urine frequency 04/13/2017   Past Surgical History:  Past Surgical  History:  Procedure Laterality Date  . APPENDECTOMY  2007  . BREAST SURGERY  2009 right  . CATARACT EXTRACTION     x 2  . ESOPHAGOGASTRODUODENOSCOPY (EGD) WITH PROPOFOL N/A 08/14/2016   Procedure: ESOPHAGOGASTRODUODENOSCOPY (EGD) WITH PROPOFOL;  Surgeon: Mauri Pole, MD;  Location: [facility] ENDOSCOPY;  Service: Endoscopy;  Laterality: N/A;  . [place]  . KNEE SURGERY    . MANDIBLE FRACTURE SURGERY    . MASTECTOMY    . PILONIDAL CYST EXCISION    . TONSILLECTOMY     HPI:  77 year old female admitted 10/30/17 due to AMS and acute hepatic/metabolic encephalopathy. PMH significant for COPD, hepatic cirrhosis, DM, HLD.   Assessment / Plan / Recommendation Clinical Impression  Pt presents with adequate oral motor strength and function, but she is edentulous, which increases mastication time. She reports symptoms of reflux throughout this session, but did not exhibit overt s/s aspiration on any consistency tested. SLP reviewed safe swallow and reflux precautions with pt, and posted them at [facility] Recommend soft solids and thin liquids. No further ST intervention recommended at this time. Please reconsult if needs arise.     SLP Visit Diagnosis: Dysphagia, unspecified (R13.10)    Aspiration Risk  Mild aspiration risk    Diet Recommendation Thin liquid;Dysphagia 3 (Mech soft)   Liquid Administration via: Cup;Straw Medication Administration: Whole meds with liquid Supervision: Patient able to self feed;Intermittent supervision to cue for compensatory strategies Compensations: Minimize environmental distractions;Slow rate;Small sips/bites;Follow solids with liquid(follow  reflux precautions) Postural Changes: Seated upright at 90 degrees;Remain upright for at least 30 minutes after po intake    Other  Recommendations Oral Care Recommendations: Oral care BID   Follow up Recommendations None          Prognosis Prognosis for Safe Diet Advancement: Good      Swallow Study    General Date of Onset: 10/30/17 HPI: 77 year old female admitted 10/30/17 due to AMS and acute hepatic/metabolic encephalopathy. PMH significant for COPD, hepatic cirrhosis, DM, HLD. Type of Study: Bedside Swallow Evaluation Previous Swallow Assessment: BSE 10/20/17 - primary esophageal dysphagia, rec reg/thin Diet Prior to this Study: NPO Temperature Spikes Noted: No Respiratory Status: Nasal cannula History of Recent Intubation: No Behavior/Cognition: Alert;Cooperative;Pleasant mood Oral Cavity Assessment: Dry Oral Care Completed by SLP: No Oral Cavity - Dentition: Dentures, not available;Edentulous Vision: Functional for self-feeding Self-Feeding Abilities: Able to feed self Patient Positioning: Upright in bed Baseline Vocal Quality: Normal Volitional Cough: Strong Volitional Swallow: Able to elicit    Oral/Motor/Sensory Function Overall Oral Motor/Sensory Function: Within functional limits   Ice Chips Ice chips: Within functional limits   Thin Liquid Thin Liquid: Within functional limits Presentation: Straw Other Comments: globus sensation    Nectar Thick Nectar Thick Liquid: Not tested   Honey Thick Honey Thick Liquid: Not tested   Puree Puree: Within functional limits Presentation: Spoon;Self Fed Other Comments: pt reports reflux   Solid   GO   Solid: Impaired Presentation: Self Fed Oral Phase Impairments: Impaired mastication(edentulous) Other Comments: pt reports reflux       Ashley Savage, [place] Speech Language Pathologist [phone]  Shonna Chock 11/01/2017,11:56 AM

## 2017-11-01 NOTE — Progress Notes (Signed)
PROGRESS NOTE  Ashley Savage TDD:[id number] DOB: 01-28-1941 DOA: 10/30/2017 PCP: Hennie Duos, MD  HPI/Recap of past 24 hours: Ashley Savage a 77 y.o.femalewitha history of 2L O2-dependent COPD, hepatic cirrhosis, DM, HLD who presented from her facility for unresponsiveness and obtundation.  Per EMS the patient has been refusing medications. Nursing home stated that patient was fine until the morning prior to admission, when they found her obtunded and unable to answer questions. In the ED, pt remained obtunded, ammonia was found to be elevated at 116, LA 1.93, saturating well on 2L of O2, afebrile with no leukocytosis. CT head negative. CXR showed PNA. Pt admitted for possible hepatic encephalopathy. Of note, pt was just discharged for similar condition on 10/22/17  Today, patient is much alert, awake, oriented to name, place. Denies any new complaints, reports being hungry.  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   Cirrhosis of liver without ascites (HCC)   Type 2 diabetes mellitus with hyperglycemia, with long-term current use of insulin (HCC)   Portal hypertensive gastropathy (HCC)   Liver cirrhosis secondary to NASH (HCC)   Abnormal urinalysis   Right upper lobe pneumonia ([place])  Acute hepatic/metabolic encephalopathy Improving, more alert Elevated ammonia 116-->60--48 T. Bili elevated at 5.2 LA mildly elevated 1.93-->2-->2.2, due to overall improvement, will not trend BC X 2 pending ABG: ABG showed PH 7.48, PCO2 40.5, PO2 87, bicarb 62.3, likely metabolic alkalosis  CT head negative Continue PO lactulose, rifaximin, aldactone, hold lasix for now  D/C IVF  Cirrhosis of liver 2/2 NASH without ascites Decompensated, presenting with encephalopathy Management as above  ?HCAP Afebrile, no leukocytosis, although + non- productive cough Procalcitonin  0.32-->0.46, up-trending  CXR: Suspect right upper lobe bronchopneumonia SLP: mild risk for aspiration, dysphagia 3 diet  Continue Cefepime, MRSA swab neg  ?UTI UA with mod leukocytes, TNTC WBC UC growing 50,000 yeast, will treat with fluconazole X 7 days since pt is asymptomatic Continue cefepime  Chronic hypoxic respiratory failure/COPD O2 dependent Saturating well on 2L of O2 ABG showed PH 7.48, PCO2 40.5, PO2 87, bicarb 30.3 CXR as above with ?PNA Continue O2, bipap prn, cefepime, duonebs  Type 2 diabetes mellitus A1c 6.7 SSI, accuchecks  HTN Stable  Thrombocytopenia  Stable, likely due to cirrhosis Monitor closely  GERD Continue Protonix   Code Status: DNR  Family Communication: None at bedside  Disposition Plan: Back to SNF once stable   Consultants:  None  Procedures:  None  Antimicrobials:  IV Cefepime  DVT prophylaxis:  Lovenox   Objective: Vitals:   11/01/17 0419 11/01/17 0831 11/01/17 0913 11/01/17 0915  BP: (!) 128/91 132/87    Pulse: 93 91    Resp: 18 18    Temp: 98.3 F (36.8 C) 98.6 F (37 C)    TempSrc: Oral Oral    SpO2: 96% 97% 99% 99%  Weight:        Intake/Output Summary (Last 24 hours) at 11/01/2017 1409 Last data filed at 11/01/2017 1339 Gross per 24 hour  Intake 1878.75 ml  Output 150 ml  Net 1728.75 ml   Filed Weights   10/30/17 1730 10/30/17 2033  Weight: 72.4 kg (159 lb 9.8 oz) 72.4 kg (159 lb 9.8 oz)    Exam:   General: NAD, alert, oriented  Cardiovascular: S1, S2 present  Respiratory: Chest clear to auscultation  Abdomen: Soft, nontender, nondistended, bowel sounds present  Musculoskeletal:  trace pitting edema bilaterally  Skin: Normal  Psychiatry: Normal mood  Neuro: No focal neurologic deficit   Data Reviewed: CBC: Recent Labs  Lab 10/30/17 1320 10/31/17 0824 11/01/17 0327  WBC 9.2 9.9 9.4  NEUTROABS 6.9  --  6.8  HGB 12.0 11.5* 10.6*  HCT 37.2 35.7* 32.9*  MCV 93.7 93.9 94.8    PLT 90* 103* 433*   Basic Metabolic Panel: Recent Labs  Lab 10/30/17 1320 10/31/17 0824 11/01/17 0327  NA 139 139 143  K 4.4 4.6 4.2  CL 103 107 110  CO2 28 25 23   GLUCOSE 161* 115* 132*  BUN 14 17 22*  CREATININE 0.80 0.75 0.90  CALCIUM 8.4* 8.2* 8.2*   GFR: Estimated Creatinine Clearance: 55.8 mL/min (by C-G formula based on SCr of 0.9 mg/dL). Liver Function Tests: Recent Labs  Lab 10/31/17 1038  AST 45*  ALT 38  ALKPHOS 127*  BILITOT 5.2*  PROT 4.9*  ALBUMIN 1.9*   No results for input(s): LIPASE, AMYLASE in the last 168 hours. Recent Labs  Lab 10/30/17 1320 10/31/17 1649 11/01/17 0327  AMMONIA 116* 60* 48*   Coagulation Profile: No results for input(s): INR, PROTIME in the last 168 hours. Cardiac Enzymes: No results for input(s): CKTOTAL, CKMB, CKMBINDEX, TROPONINI in the last 168 hours. BNP (last 3 results) Recent Labs    05/26/17 1438 07/14/17 1208  PROBNP 231.0* 265.0*   HbA1C: Recent Labs    10/30/17 1756  HGBA1C 6.7*   CBG: Recent Labs  Lab 10/31/17 2015 11/01/17 0005 11/01/17 0423 11/01/17 0803 11/01/17 1150  GLUCAP 109* 124* 133* 135* 132*   Lipid Profile: No results for input(s): CHOL, HDL, LDLCALC, TRIG, CHOLHDL, LDLDIRECT in the last 72 hours. Thyroid Function Tests: No results for input(s): TSH, T4TOTAL, FREET4, T3FREE, THYROIDAB in the last 72 hours. Anemia Panel: No results for input(s): VITAMINB12, FOLATE, FERRITIN, TIBC, IRON, RETICCTPCT in the last 72 hours. Urine analysis:    Component Value Date/Time   COLORURINE YELLOW 10/30/2017 1316   APPEARANCEUR CLEAR 10/30/2017 1316   LABSPEC 1.017 10/30/2017 1316   PHURINE 8.0 10/30/2017 1316   GLUCOSEU NEGATIVE 10/30/2017 1316   GLUCOSEU 250 (A) 04/14/2017 1108   HGBUR NEGATIVE 10/30/2017 1316   BILIRUBINUR NEGATIVE 10/30/2017 1316   BILIRUBINUR neg 12/02/2016 1508   KETONESUR NEGATIVE 10/30/2017 1316   PROTEINUR NEGATIVE 10/30/2017 1316   UROBILINOGEN 0.2 04/14/2017  1108   NITRITE NEGATIVE 10/30/2017 1316   LEUKOCYTESUR MODERATE (A) 10/30/2017 1316   Sepsis Labs: @LABRCNTIP (procalcitonin:4,lacticidven:4)  ) Recent Results (from the past 240 hour(s))  Urine culture     Status: Abnormal   Collection Time: 10/30/17  1:17 PM  Result Value Ref Range Status   Specimen Description URINE, CATHETERIZED  Final   Special Requests   Final    NONE Performed at [facility], [place] [address], [place], [place] [address]    Culture 50,000 COLONIES/mL YEAST (A)  Final   Report Status 10/31/2017 FINAL  Final  MRSA PCR Screening     Status: None   Collection Time: 10/30/17  8:40 PM  Result Value Ref Range Status   MRSA by PCR NEGATIVE NEGATIVE Final    Comment:        The GeneXpert MRSA Assay (FDA approved for NASAL specimens only), is one component of a comprehensive MRSA colonization surveillance program. It is not intended to diagnose MRSA infection nor to guide or monitor treatment for MRSA infections. Performed at [facility], [place]  [address] [place], [place] [address]       Studies: Dg Chest Port 1 View  Result Date: 11/01/2017 CLINICAL DATA:  Dyspnea, COPD EXAM: PORTABLE CHEST 1 VIEW COMPARISON:  10/30/2017 FINDINGS: The lungs are hyperinflated likely secondary to COPD. There is bilateral chronic interstitial thickening. There is no pleural effusion or pneumothorax. The heart and mediastinal contours are unremarkable. The osseous structures are unremarkable. IMPRESSION: No active disease. Electronically Signed   By: Kathreen Devoid   On: 11/01/2017 09:50    Scheduled Meds: . albuterol  2.5 mg Nebulization Q4H  . enoxaparin (LOVENOX) injection  40 mg Subcutaneous Q24H  . fluticasone  1 spray Each Nare Daily  . insulin aspart  0-9 Units Subcutaneous Q4H  . lactulose  30 g Oral TID  . mometasone-formoterol  2 puff Inhalation BID  . MUSCLE RUB  1 application Topical BID  . pantoprazole (PROTONIX) IV  40 mg Intravenous Q24H  .  rifaximin  550 mg Oral BID  . spironolactone  25 mg Oral Daily    Continuous Infusions: . ceFEPime (MAXIPIME) IV 1 g (11/01/17 0606)     LOS: 2 days     Alma Friendly, MD [facility]   If 7PM-7AM, please contact night-coverage [email or web address] Password [email or web address] 11/01/2017, 2:09 PM

## 2017-11-02 LAB — BASIC METABOLIC PANEL
Anion gap: 4 — ABNORMAL LOW (ref 5–15)
BUN: 20 mg/dL (ref 6–20)
CO2: 27 mmol/L (ref 22–32)
Calcium: 8.4 mg/dL — ABNORMAL LOW (ref 8.9–10.3)
Chloride: 112 mmol/L — ABNORMAL HIGH (ref 101–111)
Creatinine, Ser: 0.89 mg/dL (ref 0.44–1.00)
GFR calc Af Amer: >60 mL/min (ref >60)
GFR calc non Af Amer: >60 mL/min (ref >60)
Glucose, Bld: 175 mg/dL — ABNORMAL HIGH (ref 65–99)
Potassium: 3.9 mmol/L (ref 3.5–5.1)
Sodium: 143 mmol/L (ref 135–145)

## 2017-11-02 LAB — CBC WITH DIFFERENTIAL/PLATELET
Basophils Absolute: 0 10*3/uL (ref 0.0–0.1)
Basophils Relative: 0 %
Eosinophils Absolute: 0.2 10*3/uL (ref 0.0–0.7)
Eosinophils Relative: 2 %
HCT: 29.7 % — ABNORMAL LOW (ref 36.0–46.0)
Hemoglobin: 9.4 g/dL — ABNORMAL LOW (ref 12.0–15.0)
Lymphocytes Relative: 11 %
Lymphs Abs: 1 10*3/uL (ref 0.7–4.0)
MCH: 30.3 pg (ref 26.0–34.0)
MCHC: 31.6 g/dL (ref 30.0–36.0)
MCV: 95.8 fL (ref 78.0–100.0)
Monocytes Absolute: 1.7 10*3/uL — ABNORMAL HIGH (ref 0.1–1.0)
Monocytes Relative: 19 %
Neutro Abs: 6 10*3/uL (ref 1.7–7.7)
Neutrophils Relative %: 68 %
Platelets: 82 10*3/uL — ABNORMAL LOW (ref 150–400)
RBC: 3.1 MIL/uL — ABNORMAL LOW (ref 3.87–5.11)
RDW: 17.6 % — ABNORMAL HIGH (ref 11.5–15.5)
WBC: 8.9 10*3/uL (ref 4.0–10.5)

## 2017-11-02 LAB — GLUCOSE, CAPILLARY
GLUCOSE-CAPILLARY: 161 mg/dL — AB (ref 65–99)
GLUCOSE-CAPILLARY: 183 mg/dL — AB (ref 65–99)
GLUCOSE-CAPILLARY: 195 mg/dL — AB (ref 65–99)
GLUCOSE-CAPILLARY: 239 mg/dL — AB (ref 65–99)
Glucose-Capillary: 201 mg/dL — ABNORMAL HIGH (ref 65–99)
Glucose-Capillary: 162 mg/dL — ABNORMAL HIGH (ref 65–99)
Glucose-Capillary: 230 mg/dL — ABNORMAL HIGH (ref 65–99)
Glucose-Capillary: 245 mg/dL — ABNORMAL HIGH (ref 65–99)

## 2017-11-02 LAB — AMMONIA: Ammonia: 41 umol/L — ABNORMAL HIGH (ref 9–35)

## 2017-11-02 LAB — PROCALCITONIN: Procalcitonin: 0.34 ng/mL

## 2017-11-02 MED ORDER — FLUCONAZOLE 100 MG PO TABS
200.0000 mg | ORAL_TABLET | Freq: Every day | ORAL | Status: DC
  Administered 2017-11-02: 200 mg via ORAL
  Filled 2017-11-02: qty 2

## 2017-11-02 MED ORDER — PANTOPRAZOLE SODIUM 40 MG PO TBEC
40.0000 mg | DELAYED_RELEASE_TABLET | Freq: Every day | ORAL | Status: DC
  Administered 2017-11-02 – 2017-11-04 (×3): 40 mg via ORAL
  Filled 2017-11-02 (×3): qty 1

## 2017-11-02 MED ORDER — FUROSEMIDE 40 MG PO TABS
40.0000 mg | ORAL_TABLET | Freq: Every day | ORAL | Status: DC
  Administered 2017-11-02 – 2017-11-04 (×3): 40 mg via ORAL
  Filled 2017-11-02 (×3): qty 1

## 2017-11-02 MED ORDER — ALBUTEROL SULFATE (2.5 MG/3ML) 0.083% IN NEBU
2.5000 mg | INHALATION_SOLUTION | Freq: Four times a day (QID) | RESPIRATORY_TRACT | Status: DC
  Administered 2017-11-02 – 2017-11-03 (×4): 2.5 mg via RESPIRATORY_TRACT
  Filled 2017-11-02 (×4): qty 3

## 2017-11-02 NOTE — Progress Notes (Signed)
PROGRESS NOTE  NEIDA ELLEGOOD JJK:[id number] DOB: [birth year] DOA: 10/30/2017 PCP: Hennie Duos, MD  HPI/Recap of past 24 hours: DOROTEA HAND a 77 y.o.femalewitha history of 2L O2-dependent COPD, hepatic cirrhosis, DM, HLD who presented from her facility for unresponsiveness and obtundation.  Per EMS the patient has been refusing medications. Nursing home stated that patient was fine until the morning prior to admission, when they found her obtunded and unable to answer questions. In the ED, pt remained obtunded, ammonia was found to be elevated at 116, LA 1.93, saturating well on 2L of O2, afebrile with no leukocytosis. CT head negative. CXR showed PNA. Pt admitted for possible hepatic encephalopathy. Of note, pt was just discharged for similar condition on 10/22/17  Today, patient denies any new complaints. 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   Cirrhosis of liver without ascites (HCC)   Type 2 diabetes mellitus with hyperglycemia, with long-term current use of insulin (HCC)   Portal hypertensive gastropathy (HCC)   Liver cirrhosis secondary to NASH (HCC)   Abnormal urinalysis   Right upper lobe pneumonia (HCC)  Acute hepatic/metabolic encephalopathy Improving, more alert Elevated ammonia 116-->60--48-->41 T. Bili elevated at 5.2 LA mildly elevated 1.93-->2-->2.2, due to overall improvement, will not trend BC X 2, NGTD ABG: ABG showed PH 7.48, PCO2 40.5, PO2 87, bicarb 37.1, likely metabolic alkalosis  CT head negative Continue PO lactulose, rifaximin, aldactone, reduced home lasix to 40 mg daily from 80 mg  D/C IVF  Cirrhosis of liver 2/2 NASH without ascites Decompensated, presenting with encephalopathy Management as above  ?HCAP Afebrile, no leukocytosis, although + non- productive cough Procalcitonin 0.32-->0.46-->0.34 CXR:  Suspect right upper lobe bronchopneumonia SLP: mild risk for aspiration, dysphagia 3 diet  Continue Cefepime, MRSA swab neg  ?UTI UA with mod leukocytes, TNTC WBC UC growing 50,000 yeast, will treat with fluconazole X 7 days since pt is asymptomatic Continue cefepime  Chronic hypoxic respiratory failure/COPD O2 dependent Saturating well on 2L of O2 ABG showed PH 7.48, PCO2 40.5, PO2 87, bicarb 30.3 CXR as above with ?PNA Continue O2, bipap prn, cefepime, duonebs  Type 2 diabetes mellitus A1c 6.7 SSI, accuchecks  HTN Stable  Thrombocytopenia  Stable, likely due to cirrhosis Monitor closely  GERD Continue Protonix   Code Status: DNR  Family Communication: None at bedside  Disposition Plan: Back to SNF once stable, likely 11/03/17   Consultants:  None  Procedures:  None  Antimicrobials:  IV Cefepime  DVT prophylaxis:  Lovenox   Objective: Vitals:   11/02/17 0816 11/02/17 0843 11/02/17 1433 11/02/17 1535  BP: (!) 113/49   (!) 127/48  Pulse: 77   88  Resp: 18   18  Temp: 97.9 F (36.6 C)   98.8 F (37.1 C)  TempSrc: Oral   Oral  SpO2: 100% 96% 97% 99%  Weight:        Intake/Output Summary (Last 24 hours) at 11/02/2017 1608 Last data filed at 11/02/2017 1415 Gross per 24 hour  Intake 1080 ml  Output 400 ml  Net 680 ml   Filed Weights   10/30/17 1730 10/30/17 2033 11/01/17 2014  Weight: 72.4 kg (159 lb 9.8 oz) 72.4 kg (159 lb 9.8 oz) 72 kg (158 lb 11.7 oz)    Exam:   General: NAD, alert, oriented  Cardiovascular: S1, S2 present  Respiratory: Chest clear to auscultation  Abdomen: Soft, nontender,  nondistended, bowel sounds present  Musculoskeletal: trace pitting edema bilaterally  Skin: Normal  Psychiatry: Normal mood  Neuro: No focal neurologic deficit   Data Reviewed: CBC: Recent Labs  Lab 10/30/17 1320 10/31/17 0824 11/01/17 0327 11/02/17 0707  WBC 9.2 9.9 9.4 8.9  NEUTROABS 6.9  --  6.8 6.0  HGB 12.0 11.5* 10.6*  9.4*  HCT 37.2 35.7* 32.9* 29.7*  MCV 93.7 93.9 94.8 95.8  PLT 90* 103* 105* 82*   Basic Metabolic Panel: Recent Labs  Lab 10/30/17 1320 10/31/17 0824 11/01/17 0327 11/02/17 0707  NA 139 139 143 143  K 4.4 4.6 4.2 3.9  CL 103 107 110 112*  CO2 28 25 23 27   GLUCOSE 161* 115* 132* 175*  BUN 14 17 22* 20  CREATININE 0.80 0.75 0.90 0.89  CALCIUM 8.4* 8.2* 8.2* 8.4*   GFR: Estimated Creatinine Clearance: 56.4 mL/min (by C-G formula based on SCr of 0.89 mg/dL). Liver Function Tests: Recent Labs  Lab 10/31/17 1038  AST 45*  ALT 38  ALKPHOS 127*  BILITOT 5.2*  PROT 4.9*  ALBUMIN 1.9*   No results for input(s): LIPASE, AMYLASE in the last 168 hours. Recent Labs  Lab 10/30/17 1320 10/31/17 1649 11/01/17 0327 11/02/17 0707  AMMONIA 116* 60* 48* 41*   Coagulation Profile: No results for input(s): INR, PROTIME in the last 168 hours. Cardiac Enzymes: No results for input(s): CKTOTAL, CKMB, CKMBINDEX, TROPONINI in the last 168 hours. BNP (last 3 results) Recent Labs    05/26/17 1438 07/14/17 1208  PROBNP 231.0* 265.0*   HbA1C: Recent Labs    10/30/17 1756  HGBA1C 6.7*   CBG: Recent Labs  Lab 11/02/17 0421 11/02/17 0732 11/02/17 1024 11/02/17 1132 11/02/17 1316  GLUCAP 201* 162* 239* 230* 195*   Lipid Profile: No results for input(s): CHOL, HDL, LDLCALC, TRIG, CHOLHDL, LDLDIRECT in the last 72 hours. Thyroid Function Tests: No results for input(s): TSH, T4TOTAL, FREET4, T3FREE, THYROIDAB in the last 72 hours. Anemia Panel: No results for input(s): VITAMINB12, FOLATE, FERRITIN, TIBC, IRON, RETICCTPCT in the last 72 hours. Urine analysis:    Component Value Date/Time   COLORURINE YELLOW 10/30/2017 1316   APPEARANCEUR CLEAR 10/30/2017 1316   LABSPEC 1.017 10/30/2017 1316   PHURINE 8.0 10/30/2017 1316   GLUCOSEU NEGATIVE 10/30/2017 1316   GLUCOSEU 250 (A) 04/14/2017 1108   HGBUR NEGATIVE 10/30/2017 1316   BILIRUBINUR NEGATIVE 10/30/2017 1316    BILIRUBINUR neg 12/02/2016 1508   KETONESUR NEGATIVE 10/30/2017 1316   PROTEINUR NEGATIVE 10/30/2017 1316   UROBILINOGEN 0.2 04/14/2017 1108   NITRITE NEGATIVE 10/30/2017 1316   LEUKOCYTESUR MODERATE (A) 10/30/2017 1316   Sepsis Labs: @LABRCNTIP (procalcitonin:4,lacticidven:4)  ) Recent Results (from the past 240 hour(s))  Urine culture     Status: Abnormal   Collection Time: 10/30/17  1:17 PM  Result Value Ref Range Status   Specimen Description URINE, CATHETERIZED  Final   Special Requests   Final    NONE Performed at [facility], [place] [address], [place], [place] [address]    Culture 50,000 COLONIES/mL YEAST (A)  Final   Report Status 10/31/2017 FINAL  Final  MRSA PCR Screening     Status: None   Collection Time: 10/30/17  8:40 PM  Result Value Ref Range Status   MRSA by PCR NEGATIVE NEGATIVE Final    Comment:        The GeneXpert MRSA Assay (FDA approved for NASAL specimens only), is one component of a comprehensive MRSA colonization surveillance  program. It is not intended to diagnose MRSA infection nor to guide or monitor treatment for MRSA infections. Performed at [facility], [place] [address] [place], [place] [address]   Culture, blood (routine x 2)     Status: None (Preliminary result)   Collection Time: 10/31/17 10:45 AM  Result Value Ref Range Status   Specimen Description BLOOD LEFT ANTECUBITAL  Final   Special Requests   Final    BOTTLES DRAWN AEROBIC ONLY Blood Culture adequate volume   Culture   Final    NO GROWTH 2 DAYS Performed at [facility], [place] [address] [place], [place] [address]    Report Status PENDING  Incomplete  Culture, blood (routine x 2)     Status: None (Preliminary result)   Collection Time: 10/31/17 10:45 AM  Result Value Ref Range Status   Specimen Description BLOOD LEFT HAND  Final   Special Requests   Final    BOTTLES DRAWN AEROBIC ONLY Blood Culture adequate volume   Culture   Final    NO GROWTH 2  DAYS Performed at [facility], [place] [address] [place], [place] [address]    Report Status PENDING  Incomplete      Studies: No results found.  Scheduled Meds: . albuterol  2.5 mg Nebulization Q6H  . enoxaparin (LOVENOX) injection  40 mg Subcutaneous Q24H  . fluconazole  200 mg Oral Q1500  . fluticasone  1 spray Each Nare Daily  . insulin aspart  0-9 Units Subcutaneous Q4H  . lactulose  30 g Oral TID  . mometasone-formoterol  2 puff Inhalation BID  . MUSCLE RUB  1 application Topical BID  . pantoprazole  40 mg Oral Q1500  . rifaximin  550 mg Oral BID  . spironolactone  25 mg Oral Daily    Continuous Infusions: . ceFEPime (MAXIPIME) IV 1 g (11/02/17 1545)     LOS: 3 days     Alma Friendly, MD [facility]   If 7PM-7AM, please contact night-coverage [email or web address] Password [email or web address] 11/02/2017, 4:08 PM

## 2017-11-02 NOTE — Clinical Social Work Note (Addendum)
Clinical Social Work Assessment  Patient Details  Name: Ashley Savage MRN: [id number] Date of Birth: 1941-07-18  Date of referral:  11/02/17               Reason for consult:  Discharge Planning                Permission sought to share information with:  Facility Art therapist granted to share information::  Yes, Verbal Permission Granted  Name::     Ashley Savage   Agency::  [facility]   Relationship::  Friend   Contact Information:  [phone]  Housing/Transportation Living arrangements for the past 2 months:  Organ of Information:  Patient Patient Interpreter Needed:  None Criminal Activity/Legal Involvement Pertinent to Current Situation/Hospitalization:  No - Comment as needed Significant Relationships:  Friend Lives with:  Facility Resident Do you feel safe going back to the place where you live?  Yes Need for family participation in patient care:     Care giving concerns:  The patient did not express any concerns in returning to skilled nursing facility.    Social Worker assessment / plan:  CSW Intern met with patient at bedside. Ashley Savage is alert, oriented, and was sitting up in the bed. Patient was pleasant and engaged in the conversation regarding her discharge disposition. Patient informed CSW Intern that she has lived at a skilled nursing facility for a month and a half; Ashley Savage indicated that she liked the facility and will return there at discharge.  Employment status:  Retired Nurse, adult PT Recommendations:  Not assessed at this time Information / Referral to community resources:     Patient/Family's Response to care: Patient expressed that when being changed after having a bowel movement, the nurses are a little rough but she is appreciative of being clean and of the overall care she has received.    Patient/Family's Understanding of and Emotional Response to Diagnosis,  Current Treatment, and Prognosis:  Ashley Savage is understanding of her need for long term care and has agreed to receive her care at the skilled nursing facility.   Emotional Assessment Appearance:  Appears older than stated age Attitude/Demeanor/Rapport:  Engaged Affect (typically observed):  Pleasant Orientation:  Oriented to Self, Oriented to Place, Oriented to Situation Alcohol / Substance use:  Never Used Psych involvement (Current and /or in the community):  No (Comment)  Discharge Needs  Concerns to be addressed:  No discharge needs identified Readmission within the last 30 days:  No Current discharge risk:  None Barriers to Discharge:  No Barriers Identified   Ernesto Rutherford, Student-Social Work 11/02/2017, 10:57 AM

## 2017-11-02 NOTE — Consult Note (Signed)
   [facility] Inpatient Consult   11/02/2017  Ashley Savage 08-03-1940 [id number]  Patient screened for re-admission [place] [facility] services. Patient is in the [place] of the [place] [facility] services under patient's [employer]age plan.  Chart review reveals patient had been previous seen by [facility] social worker. Admitted with altered mental status with elevate ammonia levels and hepatic encephalopathy.   Spoke with inpatient regarding patient's disposition and currently patient is planning to return to [facility].  Met with the patient at the bedside.  Patient states she no longer has her apartment in [facility] and she states she is long term at [facility].  Conveyed this information to inpatient care management. Will follow for progress and disposition changes. For questions contact:   Natividad Brood, RN BSN [facility] Liaison  [phone] business mobile phone Toll free office [phone]

## 2017-11-02 NOTE — Evaluation (Signed)
Physical Therapy Evaluation Patient Details Name: Ashley Savage MRN: [id number] DOB: 1941-01-07 Today's Date: 11/02/2017   History of Present Illness  Ashley Savage is a 77 y.o. female with a history of 2L O2-dependent COPD, hepatic cirrhosis, DM, HLD who presented from her facility for unresponsiveness and obtundation.  Recent admission for respiratory distress.  Clinical Impression  Patient presents with decreased mobility due to deficits listed in PT problem list.  Currently mod to min A for in room mobility and will benefit from skilled PT in the acute setting prior to return to SNF level rehab at d/c.    Follow Up Recommendations SNF    Equipment Recommendations  None recommended by PT    Recommendations for Other Services       Precautions / Restrictions Precautions Precautions: Fall Precaution Comments: watch sats      Mobility  Bed Mobility               General bed mobility comments: up in chair  Transfers Overall transfer level: Needs assistance Equipment used: Rolling walker (2 wheeled) Transfers: Sit to/from Stand Sit to Stand: Mod assist         General transfer comment: lifting help on 2L O2 SpO2 90-93% with dyspnea 3/4   Ambulation/Gait Ambulation/Gait assistance: Min assist Ambulation Distance (Feet): (marched in place x 2 trials 30 reps each) Assistive device: Rolling walker (2 wheeled)       General Gait Details: marched in place due to limited O2 tubing and pt fatigued.  Able to perform x 2 trials.   Stairs            Wheelchair Mobility    Modified Rankin (Stroke Patients Only)       Balance Overall balance assessment: Needs assistance Sitting-balance support: No upper extremity supported;Feet supported Sitting balance-Leahy Scale: Fair     Standing balance support: Bilateral upper extremity supported;During functional activity Standing balance-Leahy Scale: Poor Standing balance comment: reliant on RW                             Pertinent Vitals/Pain Faces Pain Scale: No hurt    Home Living Family/patient expects to be discharged to:: Skilled nursing facility                      Prior Function Level of Independence: Needs assistance   Gait / Transfers Assistance Needed: primarily uses wheelchair independently in SNF. Working with PT on ambulation with RW.   ADL's / Homemaking Assistance Needed: staff assists with all ADLs        Hand Dominance   Dominant Hand: Right    Extremity/Trunk Assessment   Upper Extremity Assessment Upper Extremity Assessment: Generalized weakness    Lower Extremity Assessment Lower Extremity Assessment: Generalized weakness(R knee AROM limited with crepitus felt)    Cervical / Trunk Assessment Cervical / Trunk Assessment: Kyphotic  Communication   Communication: No difficulties  Cognition Arousal/Alertness: Awake/alert Behavior During Therapy: WFL for tasks assessed/performed Overall Cognitive Status: Within Functional Limits for tasks assessed                                        General Comments      Exercises     Assessment/Plan    PT Assessment Patient needs continued PT services  PT Problem List Decreased strength;Decreased  mobility;Decreased range of motion;Decreased activity tolerance;Decreased balance;Decreased knowledge of use of DME;Cardiopulmonary status limiting activity       PT Treatment Interventions DME instruction;Therapeutic activities;Patient/family education;Therapeutic exercise;Balance training;Gait training;Functional mobility training    PT Goals (Current goals can be found in the Care Plan section)  Acute Rehab PT Goals Patient Stated Goal: return to [name] PT Goal Formulation: With patient Time For Goal Achievement: 11/16/17 Potential to Achieve Goals: Good    Frequency Min 2X/week   Barriers to discharge        Co-evaluation               AM-PAC PT "6  Clicks" Daily Activity  Outcome Measure Difficulty turning over in bed (including adjusting bedclothes, sheets and blankets)?: A Lot Difficulty moving from lying on back to sitting on the side of the bed? : Unable Difficulty sitting down on and standing up from a chair with arms (e.g., wheelchair, bedside commode, etc,.)?: Unable Help needed moving to and from a bed to chair (including a wheelchair)?: A Little Help needed walking in hospital room?: A Lot Help needed climbing 3-5 steps with a railing? : A Lot 6 Click Score: 11    End of Session Equipment Utilized During Treatment: Gait belt;Oxygen Activity Tolerance: Patient tolerated treatment well Patient left: with call bell/phone within reach;with chair alarm set;in chair   PT Visit Diagnosis: Muscle weakness (generalized) (M62.81);Other abnormalities of gait and mobility (R26.89)    Time: 7096-2836 PT Time Calculation (min) (ACUTE ONLY): 27 min   Charges:   PT Evaluation $PT Eval Moderate Complexity: 1 Mod PT Treatments $Therapeutic Activity: 8-22 mins   PT G CodesMagda Savage, [place] [phone] 11/02/2017   Ashley Savage 11/02/2017, 4:53 PM

## 2017-11-03 DIAGNOSIS — K7290 Hepatic failure, unspecified without coma: Secondary | ICD-10-CM

## 2017-11-03 DIAGNOSIS — R8290 Unspecified abnormal findings in urine: Secondary | ICD-10-CM

## 2017-11-03 DIAGNOSIS — E1165 Type 2 diabetes mellitus with hyperglycemia: Secondary | ICD-10-CM

## 2017-11-03 DIAGNOSIS — R4182 Altered mental status, unspecified: Secondary | ICD-10-CM

## 2017-11-03 DIAGNOSIS — Z794 Long term (current) use of insulin: Secondary | ICD-10-CM

## 2017-11-03 DIAGNOSIS — J181 Lobar pneumonia, unspecified organism: Secondary | ICD-10-CM

## 2017-11-03 DIAGNOSIS — K7460 Unspecified cirrhosis of liver: Secondary | ICD-10-CM

## 2017-11-03 LAB — BASIC METABOLIC PANEL
ANION GAP: 10 (ref 5–15)
BUN: 16 mg/dL (ref 6–20)
CO2: 22 mmol/L (ref 22–32)
Calcium: 8.5 mg/dL — ABNORMAL LOW (ref 8.9–10.3)
Chloride: 106 mmol/L (ref 101–111)
Creatinine, Ser: 0.77 mg/dL (ref 0.44–1.00)
GFR calc Af Amer: >60 mL/min (ref >60)
GLUCOSE: 196 mg/dL — AB (ref 65–99)
POTASSIUM: 4.4 mmol/L (ref 3.5–5.1)
Sodium: 138 mmol/L (ref 135–145)

## 2017-11-03 LAB — GLUCOSE, CAPILLARY
GLUCOSE-CAPILLARY: 207 mg/dL — AB (ref 65–99)
GLUCOSE-CAPILLARY: 203 mg/dL — AB (ref 65–99)
Glucose-Capillary: 208 mg/dL — ABNORMAL HIGH (ref 65–99)
Glucose-Capillary: 152 mg/dL — ABNORMAL HIGH (ref 65–99)
Glucose-Capillary: 213 mg/dL — ABNORMAL HIGH (ref 65–99)

## 2017-11-03 LAB — CBC WITH DIFFERENTIAL/PLATELET
BASOS ABS: 0 10*3/uL (ref 0.0–0.1)
Basophils Relative: 0 %
EOS PCT: 3 %
Eosinophils Absolute: 0.2 10*3/uL (ref 0.0–0.7)
HEMATOCRIT: 32.1 % — AB (ref 36.0–46.0)
Hemoglobin: 10.3 g/dL — ABNORMAL LOW (ref 12.0–15.0)
LYMPHS ABS: 1.1 10*3/uL (ref 0.7–4.0)
LYMPHS PCT: 12 %
MCH: 30.5 pg (ref 26.0–34.0)
MCHC: 32.1 g/dL (ref 30.0–36.0)
MCV: 95 fL (ref 78.0–100.0)
MONO ABS: 1.1 10*3/uL — AB (ref 0.1–1.0)
Monocytes Relative: 12 %
NEUTROS ABS: 6.5 10*3/uL (ref 1.7–7.7)
Neutrophils Relative %: 73 %
PLATELETS: 85 10*3/uL — AB (ref 150–400)
RBC: 3.38 MIL/uL — AB (ref 3.87–5.11)
RDW: 17.7 % — AB (ref 11.5–15.5)
WBC: 8.9 10*3/uL (ref 4.0–10.5)

## 2017-11-03 LAB — AMMONIA: Ammonia: 71 umol/L — ABNORMAL HIGH (ref 9–35)

## 2017-11-03 MED ORDER — ALBUTEROL SULFATE (2.5 MG/3ML) 0.083% IN NEBU
2.5000 mg | INHALATION_SOLUTION | Freq: Two times a day (BID) | RESPIRATORY_TRACT | Status: DC
  Administered 2017-11-03 – 2017-11-04 (×2): 2.5 mg via RESPIRATORY_TRACT
  Filled 2017-11-03 (×2): qty 3

## 2017-11-03 MED ORDER — LACTULOSE 10 GM/15ML PO SOLN
30.0000 g | Freq: Four times a day (QID) | ORAL | Status: DC
  Administered 2017-11-03 – 2017-11-04 (×5): 30 g via ORAL
  Filled 2017-11-03 (×5): qty 45

## 2017-11-03 NOTE — Evaluation (Signed)
Occupational Therapy Evaluation Patient Details Name: Ashley Savage MRN: [id number] DOB: [birth year] Today's Date: 11/03/2017    History of Present Illness Ashley Savage is a 77 y.o. female with a history of 2L O2-dependent COPD, hepatic cirrhosis, DM, HLD who presented from her facility for unresponsiveness and obtundation.  Recent admission for respiratory distress.   Clinical Impression   Pt reports she was receiving assist with ADL from staff at SNF PTA. Currently pt min assist for stand pivot transfer and mod assist for LB ADL. DOE 3/4 with minimal activity; SpO2 90% on supplemental O2. Pt planning for return to SNF upon d/c; agree with SNF placement. Pt would benefit from continued skilled OT to address established goals.    Follow Up Recommendations  SNF    Equipment Recommendations  Other (comment)(defer to next venue)    Recommendations for Other Services       Precautions / Restrictions Precautions Precautions: Fall Restrictions Weight Bearing Restrictions: No      Mobility Bed Mobility Overal bed mobility: Needs Assistance Bed Mobility: Supine to Sit     Supine to sit: Supervision     General bed mobility comments: Increased time and effort but no physical assist required  Transfers Overall transfer level: Needs assistance Equipment used: 1 person hand held assist Transfers: Sit to/from Stand;Stand Pivot Transfers Sit to Stand: Min assist Stand pivot transfers: Min assist       General transfer comment: Min assist for balance, increased time and effort. Pt declining use of RW for pivot to chair today    Balance Overall balance assessment: Needs assistance Sitting-balance support: Feet supported;No upper extremity supported Sitting balance-Leahy Scale: Good     Standing balance support: Bilateral upper extremity supported Standing balance-Leahy Scale: Poor                             ADL either performed or assessed with clinical  judgement   ADL Overall ADL's : Needs assistance/impaired Eating/Feeding: Set up;Sitting   Grooming: Set up;Supervision/safety;Sitting   Upper Body Bathing: Set up;Supervision/ safety;Sitting   Lower Body Bathing: Moderate assistance;Sit to/from stand   Upper Body Dressing : Set up;Supervision/safety;Sitting   Lower Body Dressing: Moderate assistance;Sit to/from stand   Toilet Transfer: Minimal Production assistant, radio Details (indicate cue type and reason): Simulated by stand pivot EOB to chair         Functional mobility during ADLs: Minimal assistance(for stand pivot only) General ADL Comments: DOE 3/4; SpO2 90% on supplemental O2     Vision         Perception     Praxis      Pertinent Vitals/Pain Pain Assessment: Faces Faces Pain Scale: Hurts even more Pain Location: lower back Pain Descriptors / Indicators: Aching;Sore Pain Intervention(s): Monitored during session;Repositioned     Hand Dominance Right   Extremity/Trunk Assessment Upper Extremity Assessment Upper Extremity Assessment: Generalized weakness   Lower Extremity Assessment Lower Extremity Assessment: Defer to PT evaluation   Cervical / Trunk Assessment Cervical / Trunk Assessment: Kyphotic   Communication Communication Communication: No difficulties   Cognition Arousal/Alertness: Awake/alert Behavior During Therapy: WFL for tasks assessed/performed Overall Cognitive Status: Within Functional Limits for tasks assessed                                     General Comments       Exercises  Shoulder Instructions      Home Living Family/patient expects to be discharged to:: Skilled nursing facility                                        Prior Functioning/Environment Level of Independence: Needs assistance  Gait / Transfers Assistance Needed: primarily uses wheelchair independently in SNF. Working with PT on ambulation with RW.   ADL's / Homemaking Assistance Needed: staff assists with all ADLs            OT Problem List: Decreased strength;Decreased activity tolerance;Impaired balance (sitting and/or standing);Decreased knowledge of use of DME or AE;Cardiopulmonary status limiting activity;Pain      OT Treatment/Interventions: Self-care/ADL training;Therapeutic exercise;Energy conservation;DME and/or AE instruction;Therapeutic activities;Patient/family education;Balance training    OT Goals(Current goals can be found in the care plan section) Acute Rehab OT Goals Patient Stated Goal: return to [name] OT Goal Formulation: With patient Time For Goal Achievement: 11/17/17 Potential to Achieve Goals: Good ADL Goals Pt Will Perform Lower Body Bathing: with supervision;sit to/from stand Pt Will Perform Lower Body Dressing: with supervision;sit to/from stand Pt Will Transfer to Toilet: with supervision;ambulating;bedside commode Pt Will Perform Toileting - Clothing Manipulation and hygiene: with supervision;sit to/from stand Additional ADL Goal #1: Pt will independently verbally recall 3 energy conservaion strategies and utilize during ADL.  OT Frequency: Min 2X/week   Barriers to D/C:            Co-evaluation              AM-PAC PT "6 Clicks" Daily Activity     Outcome Measure Help from another person eating meals?: A Little Help from another person taking care of personal grooming?: A Little Help from another person toileting, which includes using toliet, bedpan, or urinal?: A Little Help from another person bathing (including washing, rinsing, drying)?: A Lot Help from another person to put on and taking off regular upper body clothing?: A Little Help from another person to put on and taking off regular lower body clothing?: A Lot 6 Click Score: 16   End of Session Equipment Utilized During Treatment: Oxygen Nurse Communication: Other (comment)(RN tech: reapply purewick)  Activity  Tolerance: Patient tolerated treatment well Patient left: in chair;with call bell/phone within reach;with chair alarm set  OT Visit Diagnosis: Unsteadiness on feet (R26.81);Other abnormalities of gait and mobility (R26.89);Muscle weakness (generalized) (M62.81)                Time: 3419-6222 OT Time Calculation (min): 20 min Charges:  OT General Charges $OT Visit: 1 Visit OT Evaluation $OT Eval Moderate Complexity: 1 Mod G-Codes:     Reinhart Saulters A. Ulice Brilliant, M.S., OTR/L Pager: [place] 11/03/2017, 3:27 PM

## 2017-11-03 NOTE — Progress Notes (Signed)
PROGRESS NOTE    Ashley Savage  WUJ:[id number] DOB: 06/24/1941 DOA: 10/30/2017 PCP: Hennie Duos, MD    Brief Narrative:  77 year old female who presented with altered mental status.  Patient does have a significant past medical history for chronic hypoxic respiratory failure due to COPD, type 2 diabetes mellitus, liver cirrhosis, and dyslipidemia.  Patient was found to be unresponsive, obtunded and poorly reactive.  Transferred to the hospital for further evaluation.  On the initial physical examination blood pressure 110/55, heart rate 83, respiratory 17, oxygen saturation 99%.  Patient was obtunded, poorly reactive, not following commands, dry mucous membranes, lungs clear to auscultation bilaterally, no wheezing, rales or rhonchi, heart S1-S2 present and rhythmic, no gallops, rubs or murmurs, the abdomen was soft and nontender, no lower extremity edema.  Sodium 139, potassium 4.4, chloride 103, bicarb 28, glucose 161, BUN 14, creatinine 0.80, ammonia 116 , white count 9.2, hemoglobin 12.0, hematocrit 37.2, platelets 90, urine analysis specific gravity 1.017, RBC 6-30, white cells too numerous to count.  Head CT negative for acute changes.  Chest x-ray with right rotation, alveolar infiltrate in the right upper lobe.  EKG normal sinus rhythm, normal axis, normal intervals.  Patient was admitted to the hospital with the working diagnosis of metabolic/hepatic portosystemic encephalopathy, complicated by right upper lobe pneumonia and pyuria.   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   Cirrhosis of liver without ascites (HCC)   Type 2 diabetes mellitus with hyperglycemia, with long-term current use of insulin (HCC)   Portal hypertensive gastropathy (HCC)   Liver cirrhosis secondary to NASH (HCC)   Abnormal urinalysis   Right upper lobe pneumonia ([place])   1.  Hepatic  to systemic encephalopathy in the setting of liver cirrhosis. Patient with persistent asterixis, and elevated ammonia level, will increase lactulose to qid and will continue rifaximin, continue non selective b blockade and diuretic therapy. No significant ascites or abdominal pain.   2.  Right upper lobe aspiration pneumonia/pneumonitis. Follow up chest film personally reviewed, noted resolution of infiltrate suggesting aspiration pneumonitis, will stop antibiotic therapy and will continue close monitoring.   3.  Urinary tract infection. Patient covered with antibiotic therapy, no urinary symptoms.   4.  Type 2 diabetes mellitus. Will continue glucose cover and monitoring with insulin sliding scale, patient tolerating po well.   5.  COPD. No signs of exacerbation, will continue oxymetry monitoring, positive wheezing, suspected chronic.    DVT prophylaxis: enoxaparin   Code Status:  dnr Family Communication:  No family at the bedside  Disposition Plan: snf in am, if continue to improve   Consultants:     Procedures:     Antimicrobials:       Subjective: Patient with positive weakness, no nausea or vomiting, no confusion or agitation. No chest pain, or dyspnea.   Objective: Vitals:   11/02/17 2218 11/02/17 2219 11/03/17 0449 11/03/17 0722  BP:   (!) 108/57 117/63  Pulse:   81 79  Resp:   20 18  Temp:   98.3 F (36.8 C) 98.4 F (36.9 C)  TempSrc:   Oral Oral  SpO2: 97% 97% 97% 98%  Weight:        Intake/Output Summary (Last 24 hours) at 11/03/2017 1158 Last data filed at 11/03/2017 0900 Gross per 24 hour  Intake 1040 ml  Output 525 ml  Net 515 ml   Filed  Weights   10/30/17 1730 10/30/17 2033 11/01/17 2014  Weight: 72.4 kg (159 lb 9.8 oz) 72.4 kg (159 lb 9.8 oz) 72 kg (158 lb 11.7 oz)    Examination:   General: deconditioned and ill looking appearing Neurology: Awake and alert, non focal  E ENT: mild pallor, no icterus, oral mucosa moist Cardiovascular: No  JVD. S1-S2 present, rhythmic, no gallops, rubs, or murmurs. ++ pitting lower extremity edema. Pulmonary:  Decreased breath sounds bilaterally, poor air movement, positive expiratory wheezing, with no rhonchi or rales. Gastrointestinal. Abdomen protuberant with, no organomegaly, non tender, no rebound or guarding Skin. No rashes Musculoskeletal: no joint deformities     Data Reviewed: I have personally reviewed following labs and imaging studies  CBC: Recent Labs  Lab 10/30/17 1320 10/31/17 0824 11/01/17 0327 11/02/17 0707 11/03/17 0412  WBC 9.2 9.9 9.4 8.9 8.9  NEUTROABS 6.9  --  6.8 6.0 6.5  HGB 12.0 11.5* 10.6* 9.4* 10.3*  HCT 37.2 35.7* 32.9* 29.7* 32.1*  MCV 93.7 93.9 94.8 95.8 95.0  PLT 90* 103* 105* 82* 85*   Basic Metabolic Panel: Recent Labs  Lab 10/30/17 1320 10/31/17 0824 11/01/17 0327 11/02/17 0707 11/03/17 0412  NA 139 139 143 143 138  K 4.4 4.6 4.2 3.9 4.4  CL 103 107 110 112* 106  CO2 28 25 23 27 22   GLUCOSE 161* 115* 132* 175* 196*  BUN 14 17 22* 20 16  CREATININE 0.80 0.75 0.90 0.89 0.77  CALCIUM 8.4* 8.2* 8.2* 8.4* 8.5*   GFR: Estimated Creatinine Clearance: 62.7 mL/min (by C-G formula based on SCr of 0.77 mg/dL). Liver Function Tests: Recent Labs  Lab 10/31/17 1038  AST 45*  ALT 38  ALKPHOS 127*  BILITOT 5.2*  PROT 4.9*  ALBUMIN 1.9*   No results for input(s): LIPASE, AMYLASE in the last 168 hours. Recent Labs  Lab 10/30/17 1320 10/31/17 1649 11/01/17 0327 11/02/17 0707 11/03/17 0412  AMMONIA 116* 60* 48* 41* 71*   Coagulation Profile: No results for input(s): INR, PROTIME in the last 168 hours. Cardiac Enzymes: No results for input(s): CKTOTAL, CKMB, CKMBINDEX, TROPONINI in the last 168 hours. BNP (last 3 results) Recent Labs    05/26/17 1438 07/14/17 1208  PROBNP 231.0* 265.0*   HbA1C: No results for input(s): HGBA1C in the last 72 hours. CBG: Recent Labs  Lab 11/02/17 2012 11/02/17 2354 11/03/17 0449  11/03/17 0723 11/03/17 1134  GLUCAP 161* 183* 208* 152* 207*   Lipid Profile: No results for input(s): CHOL, HDL, LDLCALC, TRIG, CHOLHDL, LDLDIRECT in the last 72 hours. Thyroid Function Tests: No results for input(s): TSH, T4TOTAL, FREET4, T3FREE, THYROIDAB in the last 72 hours. Anemia Panel: No results for input(s): VITAMINB12, FOLATE, FERRITIN, TIBC, IRON, RETICCTPCT in the last 72 hours.    Radiology Studies: I have reviewed all of the imaging during this hospital visit personally     Scheduled Meds: . albuterol  2.5 mg Nebulization BID  . enoxaparin (LOVENOX) injection  40 mg Subcutaneous Q24H  . fluconazole  200 mg Oral Q1500  . fluticasone  1 spray Each Nare Daily  . furosemide  40 mg Oral Daily  . insulin aspart  0-9 Units Subcutaneous Q4H  . lactulose  30 g Oral TID  . mometasone-formoterol  2 puff Inhalation BID  . MUSCLE RUB  1 application Topical BID  . pantoprazole  40 mg Oral Q1500  . rifaximin  550 mg Oral BID  . spironolactone  25 mg Oral Daily   Continuous  Infusions: . ceFEPime (MAXIPIME) IV Stopped (11/03/17 0600)     LOS: 4 days        Neya Creegan Gerome Apley, MD [facility] Pager [phone]

## 2017-11-03 NOTE — Progress Notes (Signed)
Inpatient Diabetes Program Recommendations  AACE/ADA: New Consensus Statement on Inpatient Glycemic Control (2015)  Target Ranges:  Prepandial:   less than 140 mg/dL      Peak postprandial:   less than 180 mg/dL (1-2 hours)      Critically ill patients:  140 - 180 mg/dL   Lab Results  Component Value Date   GLUCAP 152 (H) 11/03/2017   HGBA1C 6.7 (H) 10/30/2017    Review of Glycemic Control Results for Ashley Savage, Ashley Savage" (MRN [id number]) as of 11/03/2017 10:52  Ref. Range 11/02/2017 20:12 11/02/2017 23:54 11/03/2017 04:49 11/03/2017 07:23  Glucose-Capillary Latest Ref Range: 65 - 99 mg/dL 161 (H) 183 (H) 208 (H) 152 (H)   Diabetes history: Type 2 DM Outpatient Diabetes medications: Levemir 12 units BID, Novolog 0-15 units TID Current orders for Inpatient glycemic control: Novolog 0-9 units Q4H,   Inpatient Diabetes Program Recommendations:    If to remain inpatient, please consider changing frequency of correction to [place] +HS. Also, patient's home regimen consists of Levemir, consider adding back Levemir 6 units BID.  Thanks, Bronson Curb, MSN, RNC-OB Diabetes Coordinator [phone] (8a-5p)

## 2017-11-03 NOTE — Care Management Important Message (Signed)
Important Message  Patient Details  Name: Ashley Savage MRN: [id number] Date of Birth: 12/29/1940   Medicare Important Message Given:  Yes    [name] Montine Circle 11/03/2017, 1:51 PM

## 2017-11-04 DIAGNOSIS — K219 Gastro-esophageal reflux disease without esophagitis: Secondary | ICD-10-CM | POA: Diagnosis not present

## 2017-11-04 DIAGNOSIS — F419 Anxiety disorder, unspecified: Secondary | ICD-10-CM | POA: Diagnosis not present

## 2017-11-04 DIAGNOSIS — J441 Chronic obstructive pulmonary disease with (acute) exacerbation: Secondary | ICD-10-CM | POA: Diagnosis not present

## 2017-11-04 DIAGNOSIS — Z515 Encounter for palliative care: Secondary | ICD-10-CM | POA: Diagnosis not present

## 2017-11-04 DIAGNOSIS — R600 Localized edema: Secondary | ICD-10-CM | POA: Diagnosis not present

## 2017-11-04 DIAGNOSIS — R4182 Altered mental status, unspecified: Secondary | ICD-10-CM | POA: Diagnosis not present

## 2017-11-04 DIAGNOSIS — N390 Urinary tract infection, site not specified: Secondary | ICD-10-CM | POA: Diagnosis not present

## 2017-11-04 DIAGNOSIS — K7581 Nonalcoholic steatohepatitis (NASH): Secondary | ICD-10-CM | POA: Diagnosis not present

## 2017-11-04 DIAGNOSIS — Z794 Long term (current) use of insulin: Secondary | ICD-10-CM | POA: Diagnosis not present

## 2017-11-04 DIAGNOSIS — J449 Chronic obstructive pulmonary disease, unspecified: Secondary | ICD-10-CM | POA: Diagnosis not present

## 2017-11-04 DIAGNOSIS — K3189 Other diseases of stomach and duodenum: Secondary | ICD-10-CM | POA: Diagnosis not present

## 2017-11-04 DIAGNOSIS — S61402A Unspecified open wound of left hand, initial encounter: Secondary | ICD-10-CM | POA: Diagnosis not present

## 2017-11-04 DIAGNOSIS — M1990 Unspecified osteoarthritis, unspecified site: Secondary | ICD-10-CM | POA: Diagnosis not present

## 2017-11-04 DIAGNOSIS — R635 Abnormal weight gain: Secondary | ICD-10-CM | POA: Diagnosis not present

## 2017-11-04 DIAGNOSIS — R0602 Shortness of breath: Secondary | ICD-10-CM | POA: Diagnosis not present

## 2017-11-04 DIAGNOSIS — K7290 Hepatic failure, unspecified without coma: Secondary | ICD-10-CM | POA: Diagnosis not present

## 2017-11-04 DIAGNOSIS — Z1211 Encounter for screening for malignant neoplasm of colon: Secondary | ICD-10-CM | POA: Diagnosis not present

## 2017-11-04 DIAGNOSIS — D500 Iron deficiency anemia secondary to blood loss (chronic): Secondary | ICD-10-CM | POA: Diagnosis not present

## 2017-11-04 DIAGNOSIS — K766 Portal hypertension: Secondary | ICD-10-CM

## 2017-11-04 DIAGNOSIS — K7460 Unspecified cirrhosis of liver: Secondary | ICD-10-CM | POA: Diagnosis not present

## 2017-11-04 DIAGNOSIS — F329 Major depressive disorder, single episode, unspecified: Secondary | ICD-10-CM | POA: Diagnosis not present

## 2017-11-04 DIAGNOSIS — R0600 Dyspnea, unspecified: Secondary | ICD-10-CM | POA: Diagnosis not present

## 2017-11-04 DIAGNOSIS — E1165 Type 2 diabetes mellitus with hyperglycemia: Secondary | ICD-10-CM | POA: Diagnosis not present

## 2017-11-04 DIAGNOSIS — Z0000 Encounter for general adult medical examination without abnormal findings: Secondary | ICD-10-CM | POA: Diagnosis not present

## 2017-11-04 DIAGNOSIS — B9689 Other specified bacterial agents as the cause of diseases classified elsewhere: Secondary | ICD-10-CM | POA: Diagnosis not present

## 2017-11-04 DIAGNOSIS — R609 Edema, unspecified: Secondary | ICD-10-CM | POA: Diagnosis not present

## 2017-11-04 DIAGNOSIS — M6281 Muscle weakness (generalized): Secondary | ICD-10-CM | POA: Diagnosis not present

## 2017-11-04 DIAGNOSIS — Z9181 History of falling: Secondary | ICD-10-CM | POA: Diagnosis not present

## 2017-11-04 DIAGNOSIS — R531 Weakness: Secondary | ICD-10-CM | POA: Diagnosis not present

## 2017-11-04 DIAGNOSIS — J690 Pneumonitis due to inhalation of food and vomit: Secondary | ICD-10-CM | POA: Diagnosis not present

## 2017-11-04 DIAGNOSIS — E785 Hyperlipidemia, unspecified: Secondary | ICD-10-CM | POA: Diagnosis not present

## 2017-11-04 DIAGNOSIS — R8290 Unspecified abnormal findings in urine: Secondary | ICD-10-CM | POA: Diagnosis not present

## 2017-11-04 DIAGNOSIS — R488 Other symbolic dysfunctions: Secondary | ICD-10-CM | POA: Diagnosis not present

## 2017-11-04 DIAGNOSIS — J181 Lobar pneumonia, unspecified organism: Secondary | ICD-10-CM | POA: Diagnosis not present

## 2017-11-04 DIAGNOSIS — M1710 Unilateral primary osteoarthritis, unspecified knee: Secondary | ICD-10-CM | POA: Diagnosis not present

## 2017-11-04 LAB — GLUCOSE, CAPILLARY
GLUCOSE-CAPILLARY: 180 mg/dL — AB (ref 65–99)
GLUCOSE-CAPILLARY: 185 mg/dL — AB (ref 65–99)
GLUCOSE-CAPILLARY: 163 mg/dL — AB (ref 65–99)
GLUCOSE-CAPILLARY: 216 mg/dL — AB (ref 65–99)
Glucose-Capillary: 227 mg/dL — ABNORMAL HIGH (ref 65–99)

## 2017-11-04 LAB — BASIC METABOLIC PANEL
ANION GAP: 8 (ref 5–15)
BUN: 16 mg/dL (ref 6–20)
CALCIUM: 8.4 mg/dL — AB (ref 8.9–10.3)
CO2: 26 mmol/L (ref 22–32)
Chloride: 105 mmol/L (ref 101–111)
Creatinine, Ser: 0.84 mg/dL (ref 0.44–1.00)
GLUCOSE: 188 mg/dL — AB (ref 65–99)
POTASSIUM: 4 mmol/L (ref 3.5–5.1)
Sodium: 139 mmol/L (ref 135–145)

## 2017-11-04 LAB — AMMONIA: Ammonia: 23 umol/L (ref 9–35)

## 2017-11-04 MED ORDER — LACTULOSE 10 GM/15ML PO SOLN: 30.0000 g | Freq: Three times a day (TID) | ORAL | 0 refills | Status: DC

## 2017-11-04 MED ORDER — INSULIN GLARGINE 100 UNIT/ML ~~LOC~~ SOLN: 10.0000 [IU] | Freq: Every day | SUBCUTANEOUS | 11 refills | Status: AC

## 2017-11-04 NOTE — Discharge Summary (Signed)
Physician Discharge Summary  GEORGIAN MCCLORY IOE:[id number] DOB: Feb 18, 1941 DOA: 10/30/2017  PCP: Hennie Duos, MD  Admit date: 10/30/2017 Discharge date: 11/04/2017  Admitted From: SNF Disposition:  SNF  Recommendations for Outpatient Follow-up and new medication changes:  1. Follow up with PCP in 1-week 2. Continue lactulose titrate to 2 to 3 bowel movements per day 3. Will resume long acting insulin at lower dose of 10 units qhs of insulin glargine 4. Holding potassium supplements to prevent hyperkalemia  Home Health: na  Equipment/Devices: na   Discharge Condition: stable CODE STATUS: dnr  Diet recommendation: Heart healthy and diabetic prudent.   Brief/Interim Summary: 77 year old female who presented with altered mental status.  Patient does have a significant past medical history for chronic hypoxic respiratory failure due to COPD, type 2 diabetes mellitus, liver cirrhosis, and dyslipidemia.  Patient was found to be unresponsive, obtunded and poorly reactive.  Transferred to the hospital for further evaluation.  On the initial physical examination blood pressure 110/55, heart rate 83, respiratory rate17, oxygen saturation 99%.  Patient was obtunded, poorly reactive, not following commands, dry mucous membranes, lungs clear to auscultation bilaterally, no wheezing, rales or rhonchi, heart S1-S2 present and rhythmic, no gallops, rubs or murmurs, the abdomen was soft and nontender, positive lower extremity edema.  Sodium 139, potassium 4.4, chloride 103, bicarb 28, glucose 161, BUN 14, creatinine 0.80, ammonia 116 , white count 9.2, hemoglobin 12.0, hematocrit 37.2, platelets 90, urine analysis specific gravity 1.017, RBC 6-30, white cells too numerous to count.  Head CT negative for acute changes.  Chest x-ray with right rotation, alveolar infiltrate in the right upper lobe.  EKG normal sinus rhythm, normal axis, normal intervals.  Patient was admitted to the hospital with the working  diagnosis of metabolic/hepatic portosystemic encephalopathy, complicated by right upper lobe pneumonia and pyuria.  1.  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  Patient was admitted to the medical ward, she was placed on a remote telemetry monitor, received aggressive therapy with lactulose and rifaximin, with a significant improvement in her symptoms.  By the time of discharge patient is awake and alert, able to work with physical therapy.  Will continue lactulose 3 times daily, titrate to 2-3 bowel movements per day.  Patient will resume diuretic therapy with furosemide and Aldactone.  2.  Right upper lobe aspiration pneumonitis.  Stent was placed on submental oxygen per nasal cannula, antibiotic therapy with broad-spectrum antibiotic therapy with IV vancomycin and cefepime.  Follow-up chest film 48 hours after admission, she will resolution of infiltrate, suggesting aspiration pneumonitis, antibiotics were discontinued.  At discharge oxygen saturation 99% on 2 L nasal cannula of supplemental oxygen.  3.  Urinary tract infection/present on admission.  Patient was treated with antibiotic therapy, cefepime, short course.  Urine culture grew 50,000 colonies of yeast.  4.  Type 2 diabetes mellitus.  Patient was continued on insulin sliding scale for glucose coverage monitor, long-acting insulin was held.  Her glucose 213, 180, 185, 163, 216.  At discharge patient will resume low-dose long acting insulin with 10 units qhs of glargine.   5.  COPD.  No signs of acute exacerbation, chronic wheezing on lung examination.  Continue supplemental oxygen per nasal cannula, target O2 saturation greater than 88%.  Continue formoterol and mometasone.   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[facility])   Abnormal urinalysis   Right upper lobe pneumonia [facility])    Discharge Instructions   Allergies as of 11/04/2017      Reactions   Citalopram Palpitations   Irregular heart beat   Ciprofloxacin Other (See Comments)   Mental status change   Erythromycin Other (See Comments)   Stomach cramps   Glimepiride Other (See Comments)   Elevated ammonia levels   Prednisone    Increased blood sugars too high      Medication List    STOP taking these medications   amoxicillin-clavulanate 500-125 MG tablet Commonly known as:  AUGMENTIN   celecoxib 100 MG capsule Commonly known as:  CELEBREX   insulin detemir 100 UNIT/ML injection Commonly known as:  LEVEMIR Replaced by:  insulin glargine 100 UNIT/ML injection   LORazepam 0.5 MG tablet Commonly known as:  ATIVAN   MILK OF MAGNESIA PO   polyvinyl alcohol 1.4 % ophthalmic solution Commonly known as:  LUBRICANT DROPS   potassium chloride 10 MEQ tablet Commonly known as:  K-DUR   Pramox-PE-Glycerin-Petrolatum 1-0.25-14.4-15 % Crea   predniSONE 10 MG tablet Commonly known as:  DELTASONE     TAKE these medications   acetaminophen 325 MG tablet Commonly known as:  TYLENOL Take 650 mg by mouth every 6 (six) hours as needed (pain).   albuterol 0.63 MG/3ML nebulizer solution Commonly known as:  ACCUNEB Take 1 ampule by nebulization every 6 (six) hours as needed for shortness of breath.   BIOFREEZE 4 % Gel Generic drug:  Menthol (Topical Analgesic) Apply 1 application topically 2 (two) times daily. For arthritic knee pain   bisacodyl 10 MG suppository Commonly known as:  DULCOLAX Place 10 mg rectally daily as needed (constipation not relieved by MOM).   FLEET ENEMA RE Place 1 each rectally daily as needed (constipation not relieved by MOM or  bisacodyl).   FLONASE ALLERGY RELIEF 50 MCG/ACT nasal spray Generic drug:  fluticasone Place 1 spray into both nostrils daily.   furosemide 80 MG tablet Commonly known as:  LASIX Take 80 mg by mouth daily.   insulin aspart 100 UNIT/ML FlexPen Commonly known as:  NOVOLOG Inject 0-15 Units into the skin 3 (three) times daily with meals. Inject 0-15 units subcutaneously three times daily before meals and at bedtime per sliding scale: CBG 70-120 0 units, 251-150 2 units, 151-200 3 units, 201-250 5 units, 251-300 8 units, 301-350 11 units, 351-400 15 units, >400 call MD and give 15 units   insulin glargine 100 UNIT/ML injection Commonly known as:  LANTUS Inject 0.1 mLs (10 Units total) into the skin at bedtime. Replaces:  insulin detemir 100 UNIT/ML injection   lactulose 10 GM/15ML solution Commonly known as:  CHRONULAC Take 45 mLs (30 g total) by mouth 3 (three) times daily. What changed:  when to take this   magnesium oxide 400 MG tablet Commonly known as:  MAG-OX Take 400 mg by mouth 2 (two) times daily.   mometasone-formoterol 100-5 MCG/ACT Aero Commonly known as:  DULERA Inhale 2 puffs into the lungs 2 (two) times daily.   OXYGEN Inhale 2 L into the lungs continuous.   pantoprazole 20 MG tablet Commonly known as:  PROTONIX Take 20 mg by mouth daily.   rifaximin 550 MG Tabs tablet Commonly known as:  Doreene Nest  Take 550 mg by mouth 2 (two) times daily.   sodium chloride 0.65 % Soln nasal spray Commonly known as:  OCEAN Place 2 sprays into both nostrils 2 (two) times daily as needed for congestion.   spironolactone 25 MG tablet Commonly known as:  ALDACTONE Take 25 mg by mouth daily.       Allergies  Allergen Reactions  . Citalopram Palpitations    Irregular heart beat   . Ciprofloxacin Other (See Comments)    Mental status change   . Erythromycin Other (See Comments)    Stomach cramps   . Glimepiride Other (See Comments)    Elevated ammonia levels   .  Prednisone     Increased blood sugars too high    Consultations:     Procedures/Studies: Dg Chest 2 View  Result Date: 10/18/2017 CLINICAL DATA:  77 year old female with shortness of breath, hypoxia, and wheezing. EXAM: CHEST - 2 VIEW COMPARISON:  Chest radiograph dated 08/30/2017 FINDINGS: Emphysema with chronic interstitial coarsening. No focal consolidation, pleural effusion, or pneumothorax. Mild cardiomegaly. Atherosclerotic calcification of the aortic arch. No acute osseous pathology. IMPRESSION: 1. No acute cardiopulmonary process. 2. Emphysema. Electronically Signed   By: Anner Crete M.D.   On: 10/18/2017 04:49   Ct Head Wo Contrast  Result Date: 10/30/2017 CLINICAL DATA:  Acute presentation with altered mental status. Appended. Garbled speech. EXAM: CT HEAD WITHOUT CONTRAST TECHNIQUE: Contiguous axial images were obtained from the base of the skull through the vertex without intravenous contrast. COMPARISON:  08/31/2017.  08/12/2017.  11/25/2016. FINDINGS: Brain: Chronic atrophy and ventriculomegaly, stable over time. No sign of acute infarction, mass lesion, hemorrhage, hydrocephalus or extra-axial collection. Study does suffer from motion degradation. Vascular: There is atherosclerotic calcification of the major vessels at the base of the brain. Skull: Negative Sinuses/Orbits: Clear/normal Other: None IMPRESSION: No acute finding by CT.  Atrophy and chronic ventriculomegaly. Electronically Signed   By: Nelson Chimes M.D.   On: 10/30/2017 14:51   Dg Chest Port 1 View  Result Date: 11/01/2017 CLINICAL DATA:  Dyspnea, COPD EXAM: PORTABLE CHEST 1 VIEW COMPARISON:  10/30/2017 FINDINGS: The lungs are hyperinflated likely secondary to COPD. There is bilateral chronic interstitial thickening. There is no pleural effusion or pneumothorax. The heart and mediastinal contours are unremarkable. The osseous structures are unremarkable. IMPRESSION: No active disease. Electronically Signed   By:  Kathreen Devoid   On: 11/01/2017 09:50   Dg Chest Portable 1 View  Result Date: 10/30/2017 CLINICAL DATA:  77 year old female with altered mental status EXAM: PORTABLE CHEST 1 VIEW COMPARISON:  Prior chest x-ray 10/20/2017 FINDINGS: Focal patchy airspace opacification in the periphery of the right upper lobe is new compared to prior and concerning for bronchopneumonia. Stable mild cardiomegaly. Atherosclerotic calcifications are again present in the thoracic aorta. Diffuse bronchitic changes and interstitial prominence are stable. No pneumothorax or pleural effusion. No acute osseous abnormality. IMPRESSION: Suspect right upper lobe bronchopneumonia. Electronically Signed   By: Jacqulynn Cadet M.D.   On: 10/30/2017 13:12   Dg Chest Port 1 View  Result Date: 10/20/2017 CLINICAL DATA:  Shortness of Breath EXAM: PORTABLE CHEST 1 VIEW COMPARISON:  10/19/2017 FINDINGS: Mild cardiomegaly, vascular congestion and interstitial prominence, similar prior study. Bibasilar atelectasis and suspect small effusions. No real change since prior study. IMPRESSION: Suspect mild interstitial edema/CHF. Bibasilar atelectasis, small effusions. Electronically Signed   By: Rolm Baptise M.D.   On: 10/20/2017 10:15   Dg Chest Port 1 View  Result Date: 10/19/2017 CLINICAL DATA:  Followup respiratory failure. EXAM: PORTABLE CHEST 1 VIEW COMPARISON:  10/18/2017 FINDINGS: The patient is rotated towards the right. Interstitial markings appear more prominent, suggesting fluid overload/early interstitial edema. No consolidation or collapse. Possible tiny effusions. IMPRESSION: Suspicion of fluid overload/early interstitial edema. Tiny effusions. No consolidation or lobar collapse. This film is rotated towards the right. Electronically Signed   By: Nelson Chimes M.D.   On: 10/19/2017 07:44       Subjective: Patient is feeling better, no further confusion, has been working with physical therapy, no nausea or vomiting.   Discharge  Exam: Vitals:   11/04/17 0847 11/04/17 0956  BP:  123/73  Pulse:  90  Resp:  20  Temp:  98 F (36.7 C)  SpO2: 92% 99%   Vitals:   11/03/17 2008 11/04/17 0442 11/04/17 0847 11/04/17 0956  BP: (!) 127/56 (!) 127/59  123/73  Pulse: 82 74  90  Resp: 20 20  20   Temp: 98.2 F (36.8 C)   98 F (36.7 C)  TempSrc: Oral   Oral  SpO2: 97% 93% 92% 99%  Weight:        General: Not in pain or dyspnea, deconditioned Neurology: Awake and alert, non focal  E ENT: mild pallor, no icterus, oral mucosa moist Cardiovascular: No JVD. S1-S2 present, rhythmic, no gallops, rubs, or murmurs. ++ pitting bilateral lower extremity edema. Pulmonary: decreased breath sounds bilaterally, no wheezing, scattered rhonchi and rales, bilaterally. Gastrointestinal. Abdomen  no organomegaly, non tender, no rebound or guarding Skin. No rashes Musculoskeletal: no joint deformities   The results of significant diagnostics from this hospitalization (including imaging, microbiology, ancillary and laboratory) are listed below for reference.     Microbiology: Recent Results (from the past 240 hour(s))  Urine culture     Status: Abnormal   Collection Time: 10/30/17  1:17 PM  Result Value Ref Range Status   Specimen Description URINE, CATHETERIZED  Final   Special Requests   Final    NONE Performed at [facility], [address], [place], [place] [address]    Culture 50,000 COLONIES/mL YEAST (A)  Final   Report Status 10/31/2017 FINAL  Final  MRSA PCR Screening     Status: None   Collection Time: 10/30/17  8:40 PM  Result Value Ref Range Status   MRSA by PCR NEGATIVE NEGATIVE Final    Comment:        The GeneXpert MRSA Assay (FDA approved for NASAL specimens only), is one component of a comprehensive MRSA colonization surveillance program. It is not intended to diagnose MRSA infection nor to guide or monitor treatment for MRSA infections. Performed at [facility], [place] [address],  [place], [place] [address]   Culture, blood (routine x 2)     Status: None (Preliminary result)   Collection Time: 10/31/17 10:45 AM  Result Value Ref Range Status   Specimen Description BLOOD LEFT ANTECUBITAL  Final   Special Requests   Final    BOTTLES DRAWN AEROBIC ONLY Blood Culture adequate volume   Culture   Final    NO GROWTH 3 DAYS Performed at [facility], [place] [address], [place], [place] [address]    Report Status PENDING  Incomplete  Culture, blood (routine x 2)     Status: None (Preliminary result)   Collection Time: 10/31/17 10:45 AM  Result Value Ref Range Status   Specimen Description BLOOD LEFT HAND  Final   Special Requests   Final  BOTTLES DRAWN AEROBIC ONLY Blood Culture adequate volume   Culture   Final    NO GROWTH 3 DAYS Performed at [facility], [place] [address] [place], [place] [address]    Report Status PENDING  Incomplete     Labs: BNP (last 3 results) No results for input(s): BNP in the last 8760 hours. Basic Metabolic Panel: Recent Labs  Lab 10/31/17 0824 11/01/17 0327 11/02/17 0707 11/03/17 0412 11/04/17 0445  NA 139 143 143 138 139  K 4.6 4.2 3.9 4.4 4.0  CL 107 110 112* 106 105  CO2 25 23 27 22 26   GLUCOSE 115* 132* 175* 196* 188*  BUN 17 22* 20 16 16   CREATININE 0.75 0.90 0.89 0.77 0.84  CALCIUM 8.2* 8.2* 8.4* 8.5* 8.4*   Liver Function Tests: Recent Labs  Lab 10/31/17 1038  AST 45*  ALT 38  ALKPHOS 127*  BILITOT 5.2*  PROT 4.9*  ALBUMIN 1.9*   No results for input(s): LIPASE, AMYLASE in the last 168 hours. Recent Labs  Lab 10/31/17 1649 11/01/17 0327 11/02/17 0707 11/03/17 0412 11/04/17 0445  AMMONIA 60* 48* 41* 71* 23   CBC: Recent Labs  Lab 10/30/17 1320 10/31/17 0824 11/01/17 0327 11/02/17 0707 11/03/17 0412  WBC 9.2 9.9 9.4 8.9 8.9  NEUTROABS 6.9  --  6.8 6.0 6.5  HGB 12.0 11.5* 10.6* 9.4* 10.3*  HCT 37.2 35.7* 32.9* 29.7* 32.1*  MCV 93.7 93.9 94.8 95.8 95.0  PLT 90* 103* 105* 82* 85*    Cardiac Enzymes: No results for input(s): CKTOTAL, CKMB, CKMBINDEX, TROPONINI in the last 168 hours. BNP: Invalid input(s): POCBNP CBG: Recent Labs  Lab 11/03/17 2008 11/04/17 0140 11/04/17 0440 11/04/17 0728 11/04/17 1127  GLUCAP 213* 180* 185* 163* 216*   D-Dimer No results for input(s): DDIMER in the last 72 hours. Hgb A1c No results for input(s): HGBA1C in the last 72 hours. Lipid Profile No results for input(s): CHOL, HDL, LDLCALC, TRIG, CHOLHDL, LDLDIRECT in the last 72 hours. Thyroid function studies No results for input(s): TSH, T4TOTAL, T3FREE, THYROIDAB in the last 72 hours.  Invalid input(s): FREET3 Anemia work up No results for input(s): VITAMINB12, FOLATE, FERRITIN, TIBC, IRON, RETICCTPCT in the last 72 hours. Urinalysis    Component Value Date/Time   COLORURINE YELLOW 10/30/2017 1316   APPEARANCEUR CLEAR 10/30/2017 1316   LABSPEC 1.017 10/30/2017 1316   PHURINE 8.0 10/30/2017 1316   GLUCOSEU NEGATIVE 10/30/2017 1316   GLUCOSEU 250 (A) 04/14/2017 1108   HGBUR NEGATIVE 10/30/2017 1316   BILIRUBINUR NEGATIVE 10/30/2017 1316   BILIRUBINUR neg 12/02/2016 1508   KETONESUR NEGATIVE 10/30/2017 1316   PROTEINUR NEGATIVE 10/30/2017 1316   UROBILINOGEN 0.2 04/14/2017 1108   NITRITE NEGATIVE 10/30/2017 1316   LEUKOCYTESUR MODERATE (A) 10/30/2017 1316   Sepsis Labs Invalid input(s): PROCALCITONIN,  WBC,  LACTICIDVEN Microbiology Recent Results (from the past 240 hour(s))  Urine culture     Status: Abnormal   Collection Time: 10/30/17  1:17 PM  Result Value Ref Range Status   Specimen Description URINE, CATHETERIZED  Final   Special Requests   Final    NONE Performed at [facility], [place] [address] [place], [place] [address]    Culture 50,000 COLONIES/mL YEAST (A)  Final   Report Status 10/31/2017 FINAL  Final  MRSA PCR Screening     Status: None   Collection Time: 10/30/17  8:40 PM  Result Value Ref Range Status   MRSA by PCR NEGATIVE NEGATIVE  Final  Comment:        The GeneXpert MRSA Assay (FDA approved for NASAL specimens only), is one component of a comprehensive MRSA colonization surveillance program. It is not intended to diagnose MRSA infection nor to guide or monitor treatment for MRSA infections. Performed at [facility], [place] [address], [place], [place] [address]   Culture, blood (routine x 2)     Status: None (Preliminary result)   Collection Time: 10/31/17 10:45 AM  Result Value Ref Range Status   Specimen Description BLOOD LEFT ANTECUBITAL  Final   Special Requests   Final    BOTTLES DRAWN AEROBIC ONLY Blood Culture adequate volume   Culture   Final    NO GROWTH 3 DAYS Performed at [facility], [place] [address] [place], [place] [address]    Report Status PENDING  Incomplete  Culture, blood (routine x 2)     Status: None (Preliminary result)   Collection Time: 10/31/17 10:45 AM  Result Value Ref Range Status   Specimen Description BLOOD LEFT HAND  Final   Special Requests   Final    BOTTLES DRAWN AEROBIC ONLY Blood Culture adequate volume   Culture   Final    NO GROWTH 3 DAYS Performed at [facility], [place] [address], [place], [place] [address]    Report Status PENDING  Incomplete     Time coordinating discharge: 45 minutes  SIGNED:   Tawni Millers, MD  [facility] 11/04/2017, 12:02 PM Pager [phone]  If 7PM-7AM, please contact night-coverage [email or web address] Password [email or web address]

## 2017-11-04 NOTE — Clinical Social Work Note (Addendum)
Patient medically stable for discharge back to [facility] today. Facility notified, and discharge clinicals transmitted to SNF.    CSW received authorization from [facility] - [phone] for 6 days, eff. 4/11. Patient is starting day 11 and has a $160 per day co-pay with her insurance. Ms. Briel advised. Ambulance transport has been arranged. CSW Intern attempted to reach patient's friend Ebony Hail "Fanny Skates" Lovena Le by phone [phone]) and message left regarding patient's discharge (Ms. Marmo name not used).  *Charlene Brooke called CSW (4:21 pm) and was advised of patient's discharge back to [facility]. Ms. Lovena Le expressed appreciation for call.   Rolando Whitby Givens, MSW, LCSW Licensed Clinical Social Worker [place] [phone]

## 2017-11-04 NOTE — NC FL2 (Signed)
[place] LEVEL OF CARE SCREENING TOOL     IDENTIFICATION  Patient Name: Ashley Savage Birthdate: 04-Oct-1940 Sex: female Admission Date (Current Location): 10/30/2017  [place] [facility] and [place] Number:  [employer] and Address:  [facility] [place], [place] [address], [place], [place] [address]      Provider Number: [id number]  Attending Physician Name and Address:  Tawni Millers,*  Relative Name and Phone Number:   Givens - friend - [phone]; Andrey Spearman - [phone]    Current Level of Care: Hospital Recommended Level of Care: Skilled Nursing Facility([place]) Prior Approval Number:    Date Approved/Denied:   PASRR Number:    Discharge Plan: SNF    Current Diagnoses: Patient Active Problem List   Diagnosis Date Noted  . Hepatic encephalopathy ([place]) 10/30/2017  . Abnormal urinalysis 10/30/2017  . Right upper lobe pneumonia ([place]) 10/30/2017  . Acute respiratory failure with hypoxia and hypercapnia ([place]) 10/26/2017  . Acute pulmonary edema ([place]) 10/26/2017  . Acute metabolic encephalopathy 97/98/9211  . Liver cirrhosis secondary to NASH ([place]) 10/26/2017  . Sepsis ([place])   . Acute on chronic respiratory failure with hypoxia ([place]) 10/18/2017  . Leg edema, left 10/02/2017  . Arthritis 10/02/2017  . COPD exacerbation ([place]) 06/25/2017  . Ventricular tachycardia ([place]) 05/22/2017  . Acute kidney injury ([place]) 05/17/2017  . Hyperkalemia 05/17/2017  . Vitamin D deficiency 05/17/2017  . COPD with acute exacerbation ([place]) 05/05/2017  . Urine frequency 04/13/2017  . Osteopenia 03/30/2017  . Dermatitis 03/30/2017  . Allergic state 11/10/2016  . 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 Portal hypertensive gastropathy ([place])   . Lower back injury, initial encounter 08/05/2016  . Fall 07/30/2016  . Preventative health care 03/08/2016  . Muscle spasm 02/25/2016  . Chronic respiratory failure ([place]) 08/29/2015  . NASH  (nonalcoholic steatohepatitis) 08/26/2015  . Type 2 diabetes mellitus with hyperglycemia, with long-term current use of insulin ([place])   . Cirrhosis of liver without ascites ([place]) 07/31/2015  . Acute respiratory failure with hypoxia ([place]) 07/31/2015  . Diarrhea 12/30/2014  . Increased ammonia level 11/25/2014  . Encephalopathy, hepatic ([place]) 06/07/2014  . Right knee pain 06/07/2014  . Sun-damaged skin 02/01/2014  . Anxiety and depression 02/01/2014  . Tobacco abuse 02/01/2014  . Medicare annual wellness visit, subsequent 02/01/2014  . Pedal edema 12/10/2013  . Overactive bladder 12/10/2013  . Abdominal aortic aneurysm ([place]) 10/01/2013  . Arthritis of right knee 10/01/2013  . Benign paroxysmal positional vertigo 10/01/2013  . Neck pain 10/01/2013  . Esophageal reflux 10/01/2013  . Thrombocytopenia ([place]) 03/17/2012  . 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   . Hyperlipidemia, mixed   . Breast cancer ([place])     Orientation RESPIRATION BLADDER Height & Weight     Self, Time, Situation, Place  O2(2L oxygen) External catheter(Placed 4/6) Weight: 158 lb 11.7 oz (72 kg) Height:     BEHAVIORAL SYMPTOMS/MOOD NEUROLOGICAL BOWEL NUTRITION STATUS      Incontinent Diet(DYS 3)  AMBULATORY STATUS COMMUNICATION OF NEEDS Skin   Limited Assist Verbally Other (Comment), Skin abrasions(Ecchymosis arm, abdomen, leg (left & right); Skin abrasion to right/leg back and leg)                       Personal Care Assistance Level of Assistance  Bathing, Feeding Bathing Assistance: Maximum assistance(Upper body-assistance with set-up and Lower body - mod assist) Feeding assistance: Independent(Assistance with set-up) Dressing  Assistance: Maximum assistance(Upper body-assistance with set-up and Lower body - mod assist)     Functional Limitations Info  Sight, Hearing, Speech Sight Info: Impaired Hearing Info: Adequate Speech Info: Adequate    SPECIAL CARE FACTORS  FREQUENCY  PT (By licensed PT), OT (By licensed OT)     PT Frequency: Evaluated 4/9 OT Frequency: Evaluated 4/10            Contractures Contractures Info: Not present    Additional Factors Info  Code Status, Allergies, Insulin Sliding Scale Code Status Info: DNR Allergies Info: Ciprofloxacin, Glimepride, Prednisone, Citalopram, Erythromycin   Insulin Sliding Scale Info: 0-9 units every 4 hours       Current Medications (11/04/2017):  This is the current hospital active medication list Current Facility-Administered Medications  Medication Dose Route Frequency Provider Last Rate Last Dose  . acetaminophen (TYLENOL) tablet 650 mg  650 mg Oral Q6H PRN Lady Deutscher, MD   650 mg at 11/03/17 1434   Or  . acetaminophen (TYLENOL) suppository 650 mg  650 mg Rectal Q6H PRN Lady Deutscher, MD      . albuterol (PROVENTIL) (2.5 MG/3ML) 0.083% nebulizer solution 2.5 mg  2.5 mg Nebulization Q6H PRN Lady Deutscher, MD   2.5 mg at 10/31/17 2002  . albuterol (PROVENTIL) (2.5 MG/3ML) 0.083% nebulizer solution 2.5 mg  2.5 mg Nebulization BID Hennie Duos, MD   2.5 mg at 11/04/17 0847  . bisacodyl (DULCOLAX) suppository 10 mg  10 mg Rectal Daily PRN Lady Deutscher, MD      . enoxaparin (LOVENOX) injection 40 mg  40 mg Subcutaneous Q24H Lady Deutscher, MD   40 mg at 11/03/17 1832  . fluticasone (FLONASE) 50 MCG/ACT nasal spray 1 spray  1 spray Each Nare Daily Lady Deutscher, MD   1 spray at 11/04/17 1057  . furosemide (LASIX) tablet 40 mg  40 mg Oral Daily Alma Friendly, MD   40 mg at 11/04/17 1055  . insulin aspart (novoLOG) injection 0-9 Units  0-9 Units Subcutaneous Q4H Lady Deutscher, MD   3 Units at 11/04/17 1249  . lactulose (CHRONULAC) 10 GM/15ML solution 30 g  30 g Oral QID Tawni Millers, MD   30 g at 11/04/17 1053  . mometasone-formoterol (DULERA) 100-5 MCG/ACT inhaler 2 puff  2 puff Inhalation BID Lady Deutscher, MD   2 puff at 11/04/17  0847  . MUSCLE RUB CREA 1 application  1 application Topical BID Lady Deutscher, MD   1 application at 76/19/50 1058  . ondansetron (ZOFRAN) tablet 4 mg  4 mg Oral Q6H PRN Lady Deutscher, MD       Or  . ondansetron [facility]) injection 4 mg  4 mg Intravenous Q6H PRN Lady Deutscher, MD      . pantoprazole (PROTONIX) EC tablet 40 mg  40 mg Oral Q1500 Alvira Philips, [place]   40 mg at 11/03/17 1436  . polyvinyl alcohol (LIQUIFILM TEARS) 1.4 % ophthalmic solution 1 drop  1 drop Both Eyes PRN Lady Deutscher, MD      . rifaximin Doreene Nest) tablet 550 mg  550 mg Oral BID Lady Deutscher, MD   550 mg at 11/04/17 1057  . sodium chloride (OCEAN) 0.65 % nasal spray 2 spray  2 spray Each Nare BID PRN Lady Deutscher, MD      . spironolactone (ALDACTONE) tablet 25 mg  25 mg Oral Daily Lady Deutscher, MD  25 mg at 11/04/17 1054     Discharge Medications: Please see discharge summary for a list of discharge medications.  Relevant Imaging Results:  Relevant Lab Results:   Additional Information ss#[id number].   Sable Feil, LCSW

## 2017-11-04 NOTE — Progress Notes (Signed)
PT Cancellation Note  Patient Details Name: Ashley Savage MRN: [id number] DOB: [birth year]   Cancelled Treatment:    Reason Eval/Treat Not Completed: Patient declined, no reason specified; awaiting transport to SNF and feels she needs to wait to get there to continue PT as has a lot to do to get settled back there.  PT to follow up at SNF.   [name] 11/04/2017, 5:04 PM  [name], [place] 11/04/2017

## 2017-11-04 NOTE — Progress Notes (Signed)
Patient discharged to SNF. After visit Summary reviewed. Patient capable of reverbalizing medications and follow up visits. No signs and symptoms of distress noted. Patient educated to return to the ED in the case of an emergency. Arhaan Chesnut RN 

## 2017-11-05 ENCOUNTER — Other Ambulatory Visit: Payer: Self-pay

## 2017-11-05 ENCOUNTER — Telehealth: Payer: Self-pay

## 2017-11-05 ENCOUNTER — Non-Acute Institutional Stay (SKILLED_NURSING_FACILITY): Payer: PPO | Admitting: Adult Health

## 2017-11-05 ENCOUNTER — Encounter: Payer: Self-pay | Admitting: Adult Health

## 2017-11-05 DIAGNOSIS — K7290 Hepatic failure, unspecified without coma: Secondary | ICD-10-CM | POA: Diagnosis not present

## 2017-11-05 DIAGNOSIS — M1990 Unspecified osteoarthritis, unspecified site: Secondary | ICD-10-CM

## 2017-11-05 DIAGNOSIS — K7581 Nonalcoholic steatohepatitis (NASH): Secondary | ICD-10-CM | POA: Diagnosis not present

## 2017-11-05 DIAGNOSIS — K7682 Hepatic encephalopathy: Secondary | ICD-10-CM

## 2017-11-05 DIAGNOSIS — J189 Pneumonia, unspecified organism: Secondary | ICD-10-CM

## 2017-11-05 DIAGNOSIS — Z794 Long term (current) use of insulin: Secondary | ICD-10-CM | POA: Diagnosis not present

## 2017-11-05 DIAGNOSIS — R600 Localized edema: Secondary | ICD-10-CM

## 2017-11-05 DIAGNOSIS — K7460 Unspecified cirrhosis of liver: Secondary | ICD-10-CM

## 2017-11-05 DIAGNOSIS — J181 Lobar pneumonia, unspecified organism: Secondary | ICD-10-CM | POA: Diagnosis not present

## 2017-11-05 DIAGNOSIS — K219 Gastro-esophageal reflux disease without esophagitis: Secondary | ICD-10-CM

## 2017-11-05 DIAGNOSIS — E1165 Type 2 diabetes mellitus with hyperglycemia: Secondary | ICD-10-CM

## 2017-11-05 LAB — CULTURE, BLOOD (ROUTINE X 2)
Culture: NO GROWTH
Culture: NO GROWTH
SPECIAL REQUESTS: ADEQUATE
Special Requests: ADEQUATE

## 2017-11-05 NOTE — Telephone Encounter (Signed)
Possible re-admission to facility. This is a patient you were seeing at [facility] . [facility] F/U is needed if patient was re-admitted to facility upon discharge. Hospital discharge from [facility] on 11/02/2017

## 2017-11-05 NOTE — Progress Notes (Signed)
Location:   [facility] [place] Room Number: [place] of Service:  SNF (31)   CODE STATUS: DNR  Allergies  Allergen Reactions  . Citalopram Palpitations    Irregular heart beat   . Ciprofloxacin Other (See Comments)    Mental status change   . Erythromycin Other (See Comments)    Stomach cramps   . Glimepiride Other (See Comments)    Elevated ammonia levels   . Prednisone     Increased blood sugars too high    Chief Complaint  Patient presents with  . Hospitalization Follow-up    Hospital Follow up    HPI:  She is a 77 year old long term resident of this facility who has been hospitalized from 10-30-17 through 11-04-17.  She has a significant medical history of copd diabetes; liver cirrhosis. She was taken to the ED after she was found to be unresponsive. 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 She will need to continue her lactulose 45 cc three times daily. She and I have had a extensive conversation regarding taking this medication. She does verbalize understanding that she will inform nursing if she has more than 5 stools in one day. She verbalize understanding of the importance of taking this medication. She was also treated for aspiration pneumonia. Her urine culture did grow 50,000 colonies of yeast. She will need to be maintained on 02 to keep sats greater than 88%. She denies any chest pain; she does have shortness of breath and edema present which she states is chronic. She will continue to be followed for her chronic illnesses including: copd; cirrhosis of liver; and diabetes. There are no nursing concerns at this time.   Past Medical History:  Diagnosis Date  . Abdominal aortic aneurysm ([place]) 10/01/2013   Fall of 2014 3.3 per patient, follows with Vascular surgeon.   . Acute respiratory failure with hypoxia ([place]) 07/31/2015  . Allergic state 11/10/2016  . Anxiety     . Anxiety and depression 02/01/2014  . Arthritis of both knees 10/01/2013  . Arthritis of right knee 10/01/2013   Follows with Dr Mayer Camel   . Benign paroxysmal positional vertigo 10/01/2013  . Breast cancer ([place])    No disease activity On Femara Follows with Dr Marin Olp Right mastectomy performed by Dr Autumn Messing   . Cancer [facility]) breast ca  right  . Chronic respiratory failure ([place]) 08/29/2015  . Cirrhosis of liver without ascites ([place]) 07/31/2015  . COPD (chronic obstructive pulmonary disease) ([place]) 10/01/2013  . COPD with acute exacerbation ([place]) 05/05/2017  . Depression   . Dermatitis 03/30/2017  . Diabetes mellitus type 2  . Diabetes mellitus type 2, controlled ([place]) 10/16/2014  . Emphysema   . Encephalopathy, hepatic ([place]) 06/07/2014  . Esophageal reflux 10/01/2013  . Fall 07/30/2016  . Hyperlipidemia   . Hyperlipidemia, mixed   . Increased ammonia level 11/25/2014  . NASH (nonalcoholic steatohepatitis) 08/26/2015  . Neck pain 10/01/2013  . Neuropathy    feet   . Osteopenia 03/30/2017  . Overactive bladder 12/10/2013  . Panic attacks   . Pedal edema 12/10/2013  . Personal history of radiation therapy   . Preventative health care 03/08/2016  . Thrombocytopenia ([place]) 03/17/2012  . Tobacco abuse disorder 02/01/2014  . Type 2 diabetes mellitus with hyperglycemia, with long-term current use of insulin ([place])   . Urine frequency 04/13/2017    Past Surgical History:  Procedure Laterality Date  . APPENDECTOMY  2007  . BREAST SURGERY  2009 right  . CATARACT EXTRACTION     x 2  . ESOPHAGOGASTRODUODENOSCOPY (EGD) WITH PROPOFOL N/A 08/14/2016   Procedure: ESOPHAGOGASTRODUODENOSCOPY (EGD) WITH PROPOFOL;  Surgeon: Mauri Pole, MD;  Location: [facility] ENDOSCOPY;  Service: Endoscopy;  Laterality: N/A;  . [place]  . KNEE SURGERY    . MANDIBLE FRACTURE SURGERY    . MASTECTOMY    . PILONIDAL CYST EXCISION    . TONSILLECTOMY      Social History   Socioeconomic History  . Marital status: Single     Spouse name: Not on file  . Number of children: 0  . Years of education: Not on file  . Highest education level: Not on file  Occupational History  . Occupation: retired    Fish farm manager: RETIRED  Social Needs  . Financial resource strain: Not on file  . Food insecurity:    Worry: Not on file    Inability: Not on file  . Transportation needs:    Medical: Not on file    Non-medical: Not on file  Tobacco Use  . Smoking status: Former Smoker    Packs/day: 0.25    Years: 58.00    Pack years: 14.50    Types: Cigarettes    Start date: 07/27/1964  . Smokeless tobacco: Never Used  Substance and Sexual Activity  . Alcohol use: No    Alcohol/week: 0.0 oz  . Drug use: No  . Sexual activity: Never    Birth control/protection: Post-menopausal  Lifestyle  . Physical activity:    Days per week: Not on file    Minutes per session: Not on file  . Stress: Not on file  Relationships  . Social connections:    Talks on phone: Not on file    Gets together: Not on file    Attends religious service: Not on file    Active member of club or organization: Not on file    Attends meetings of clubs or organizations: Not on file    Relationship status: Not on file  . Intimate partner violence:    Fear of current or ex partner: Not on file    Emotionally abused: Not on file    Physically abused: Not on file    Forced sexual activity: Not on file  Other Topics Concern  . Not on file  Social History Narrative   Lives alone,  no dietary restrictions.  States no longer smoking.   Family History  Problem Relation Age of Onset  . Heart failure Father   . COPD Father   . Arthritis Father 67  . Stroke Mother   . Arthritis Mother 25  . Hyperlipidemia Mother   . Hypertension Mother   . Diabetes Mother   . Diabetes Sister   . Breast cancer Unknown   . Breast cancer Maternal Aunt   . Asthma Maternal Aunt   . Birth defects Maternal Aunt   . Alcohol abuse Maternal Uncle   . Breast cancer Maternal  Aunt   . Stomach cancer Neg Hx   . Colon cancer Neg Hx       VITAL SIGNS BP 130/70   Pulse 80   Temp 97.8 F (36.6 C)   Resp 20   Ht '5\' 6"'  (1.676 m)   Wt 158 lb 12 oz (72 kg)   LMP  (LMP Unknown)   SpO2 91%   BMI 25.62 kg/m  Outpatient Encounter Medications as of 11/05/2017  Medication Sig  . acetaminophen (TYLENOL) 325 MG tablet Take 650 mg by mouth every 6 (six) hours as needed (pain).   Marland Kitchen albuterol (ACCUNEB) 0.63 MG/3ML nebulizer solution Take 1 ampule by nebulization every 6 (six) hours as needed for shortness of breath.  . fluticasone (FLONASE ALLERGY RELIEF) 50 MCG/ACT nasal spray Place 1 spray into both nostrils daily.  . furosemide (LASIX) 80 MG tablet Take 80 mg by mouth daily.  . insulin aspart (NOVOLOG) 100 UNIT/ML FlexPen Inject 0-15 Units into the skin 3 (three) times daily with meals. Inject 0-15 units subcutaneously three times daily before meals and at bedtime per sliding scale: CBG 70-120 0 units, 121-150 2 units, 151-200 3 units, 201-250 5 units, 251-300 8 units, 301-350 11 units, 351-400 15 units, >400 call MD and give 15 units  . insulin glargine (LANTUS) 100 UNIT/ML injection Inject 0.1 mLs (10 Units total) into the skin at bedtime.  Marland Kitchen lactulose (CHRONULAC) 10 GM/15ML solution Take 45 mLs (30 g total) by mouth 3 (three) times daily.  . magnesium oxide (MAG-OX) 400 MG tablet Take 400 mg by mouth 2 (two) times daily.  . Menthol, Topical Analgesic, (BIOFREEZE) 4 % GEL Apply 1 application topically 2 (two) times daily. For arthritic knee pain  . mometasone-formoterol (DULERA) 100-5 MCG/ACT AERO Inhale 2 puffs into the lungs 2 (two) times daily.  . OXYGEN Inhale 2 L into the lungs continuous.  . pantoprazole (PROTONIX) 20 MG tablet Take 20 mg by mouth daily.  . rifaximin (XIFAXAN) 550 MG TABS tablet Take 550 mg by mouth 2 (two) times daily.  . sodium chloride (OCEAN) 0.65 % SOLN nasal spray Place 2 sprays into both nostrils 2 (two) times daily as needed for  congestion.  . Sodium Phosphates (FLEET ENEMA RE) Place 1 each rectally daily as needed (constipation not relieved by MOM or bisacodyl).  Marland Kitchen spironolactone (ALDACTONE) 25 MG tablet Take 25 mg by mouth daily.  . bisacodyl (DULCOLAX) 10 MG suppository Place 10 mg rectally daily as needed (constipation not relieved by MOM).    No facility-administered encounter medications on file as of 11/05/2017.      SIGNIFICANT DIAGNOSTIC EXAMS  TODAY :  10-18-17: 2-d echo:   - Left ventricle: The cavity size was normal. Wall thickness was increased in a pattern of mild LVH. Systolic function was vigorous. The estimated ejection fraction was in the range of 65% to 70%. Wall motion was normal; there were no regional wall motion abnormalities. Doppler parameters are consistent with abnormal left ventricular relaxation (grade 1 diastolic dysfunction). Doppler parameters are consistent with elevated mean left atrial filling pressure. - Aortic valve: Transvalvular velocity was increased more than expected, due to high cardiac output. There was very mild stenosis. - Mitral valve: Moderately to severely calcified annulus.  - Atrial septum: There was increased thickness of the septum, consistent with lipomatous hypertrophy. No defect or patent foramen ovale was identified. - Pulmonary arteries: Systolic pressure was mildly to moderately increased.   10-30-17: chest x-ray: Suspect right upper lobe bronchopneumonia.  10-30-17: ct of head: No acute finding by CT. Atrophy and chronic ventriculomegaly.   11-01-17: chest x-ray: No active disease.   LABS REVIEWED: TODAY:   10-30-17: wbc 9.2; hgb 12.0; hct 37.2; mcv 93.7; plt 90; glucose 161; bun 14; creat 0.80; k+ 4.4; an++ 139; ca 8.4; ammonia 116; hgb a1c 6.7; urine culture: 50,000 colonies yeast 10-31-17: wbc 9.9; hgb 11.5; hct 35.7; mcv 93.9; plt 103; glucose  115; bun 17; creat 0.75; k+ 4.6; na++ 139; ca 8.2; ast 45; alt 38; alk phos 127; total bili 5.2; albumin 1.9; blood  culture: no growth; ammonia 60 11-02-17: wbc 8.9; hgb 9.4; hct 297; mcv 95.8; plt 82; glucose 175; bun 20; creat 0.89; k+ 3.9; na++ 143; ca 8.4; ammonia 41 11-04-17: wbc 188; bun 16; creat 0.84; k+ 4.0; na++ 139; ca 8.4; ammonia 23     Review of Systems  Constitutional: Negative for malaise/fatigue.  Respiratory: Positive for shortness of breath. Negative for cough.        Uses 02   Cardiovascular: Negative for chest pain, palpitations and leg swelling.  Gastrointestinal: Negative for abdominal pain, constipation and heartburn.  Musculoskeletal: Negative for back pain, joint pain and myalgias.  Skin: Negative.   Neurological: Negative for dizziness.  Psychiatric/Behavioral: The patient is not nervous/anxious.      Physical Exam  Constitutional: She appears well-developed and well-nourished. No distress.  Neck: No thyromegaly present.  Cardiovascular: Normal rate, regular rhythm, normal heart sounds and intact distal pulses.  Pulmonary/Chest: Effort normal. No respiratory distress. She has wheezes.  02 dependent at 2L Has expiratory wheezes throughout  Abdominal: Soft. Bowel sounds are normal.  Musculoskeletal: Normal range of motion.  Is able to move all extremities Has 3+ bilateral lower extremity edema beginning in thighs   Lymphadenopathy:    She has no cervical adenopathy.  Neurological: She is alert.  Skin: Skin is warm and dry. She is not diaphoretic.  Psychiatric: She has a normal mood and affect.     ASSESSMENT/ PLAN:  TODAY;   1. Bilateral lower extremity edema: no change in status; will continue lasix 80 mg daily will monitor   2. Obstructive chronic bronchitis without exacerbation COPD gold stage C; with acute respiratory failure with hypoxia and hypercapnia: is stable is on continuous 02 at 2L/min; dulera 100-5 mcg 2 puffs twice daily;  flonase daily has albuterol neb treatments every 6 hours as needed  3. Esophageal reflux disease without esophagitis: stable  will continue protonix 20 mg daily   4. Liver cirrhosis secondary to NASH; portal hypertensive gastropathy: hepatic encephalopathy: stable; ammonia level 23; will continue lactulose45 cc three times daily; xifaxan 550 mg twice daily ; aldactone 25 mg daily  5. Type 2 diabetes mellitus with hyperglycemia with long term current use of insulin: stable hgb a1c 6.7; will continue lantus 10 units nightly novolog SSI: 121-150: 2 units; 151-200: 3 units; 201-250: 5 units; 251-300: 8 units; 301-350: 11 units; 351-400: 15 units  6. Arthritis: stable will continue biofreeze to knees twice daily   7.  Hypomagnesemia: stable will continue mag ox 400 mg twice daily   8. Right upper lobe aspiration pneumonitis: has completed abt; will continue to monitor her status.     MD is aware of resident's narcotic use and is in agreement with current plan of care. We will attempt to wean resident as apropriate   Ok Edwards NP [facility] Adult Medicine  Contact [phone] Monday through Friday 8am- 5pm  After hours call [phone]

## 2017-11-08 ENCOUNTER — Other Ambulatory Visit: Payer: Self-pay | Admitting: Licensed Clinical Social Worker

## 2017-11-08 NOTE — Patient Outreach (Signed)
[facility]) Care Management  11/08/2017  Ashley Savage 1940-11-01 [id number]  Assessment- [facility] CSW received confirmation from [place] RN Utilization Management Post Acute Reviewer, that patient has not been LTC yet at [facility] yet. However, that will be the plan eventually but at this time she remains skilled at this time mostly with nursing.   [facility] CSW completed visit to patient at [facility] on 11/08/17. Patient was in her room eating when [facility] CSW arrived. Patient shares that her appetite has increased which she is thankful for. Patient reports that her niece came by last week and provided patient with brand new toiletries, phone card with minutes and snacks. Patient is aware that she is not at SNF for LTC at this current time. Patient reports going to therapy daily. Patient shares that she is experiencing pain with activity. Patient continues to have ongoing incontinence issues and reports having an incident on her bed earlier. Patient shares that her legs continue to swell but confirms that she has been elevating her legs most nights with a pillow.  Plan-CSW will follow up with SNF within three weeks and will continue to follow patient closely while at SNF until she is placed under LTC.  [name], BSW, MSW, [email or web address] Phone: [phone] Fax: [phone]

## 2017-11-09 ENCOUNTER — Encounter: Payer: Self-pay | Admitting: Internal Medicine

## 2017-11-09 ENCOUNTER — Non-Acute Institutional Stay (SKILLED_NURSING_FACILITY): Payer: PPO | Admitting: Internal Medicine

## 2017-11-09 ENCOUNTER — Other Ambulatory Visit: Payer: Self-pay | Admitting: *Deleted

## 2017-11-09 DIAGNOSIS — K7290 Hepatic failure, unspecified without coma: Secondary | ICD-10-CM

## 2017-11-09 DIAGNOSIS — F329 Major depressive disorder, single episode, unspecified: Secondary | ICD-10-CM

## 2017-11-09 DIAGNOSIS — Z794 Long term (current) use of insulin: Secondary | ICD-10-CM | POA: Diagnosis not present

## 2017-11-09 DIAGNOSIS — F419 Anxiety disorder, unspecified: Secondary | ICD-10-CM

## 2017-11-09 DIAGNOSIS — J181 Lobar pneumonia, unspecified organism: Secondary | ICD-10-CM

## 2017-11-09 DIAGNOSIS — K7460 Unspecified cirrhosis of liver: Secondary | ICD-10-CM | POA: Diagnosis not present

## 2017-11-09 DIAGNOSIS — E1165 Type 2 diabetes mellitus with hyperglycemia: Secondary | ICD-10-CM

## 2017-11-09 DIAGNOSIS — K7682 Hepatic encephalopathy: Secondary | ICD-10-CM

## 2017-11-09 DIAGNOSIS — K7581 Nonalcoholic steatohepatitis (NASH): Secondary | ICD-10-CM | POA: Diagnosis not present

## 2017-11-09 DIAGNOSIS — J449 Chronic obstructive pulmonary disease, unspecified: Secondary | ICD-10-CM | POA: Diagnosis not present

## 2017-11-09 DIAGNOSIS — J189 Pneumonia, unspecified organism: Secondary | ICD-10-CM

## 2017-11-09 NOTE — Progress Notes (Signed)
Provider:Anjali, Lyndel Safe   Location:   [facility] Room Number: 237/S Place of Service:  SNF (31)  PCP: Hennie Duos, MD Patient Care Team: Hennie Duos, MD as PCP - General (Internal Medicine) Dene Gentry, MD as Consulting Physician (Sports Medicine) Mauri Pole, MD as Consulting Physician (Gastroenterology) Rigoberto Noel, MD as Consulting Physician (Pulmonary Disease) Parrett, Fonnie Mu, NP as Nurse Practitioner (Pulmonary Disease) Volanda Napoleon, MD as Consulting Physician (Oncology) Greg Cutter, LCSW as [place] [facility] (Licensed Clinical Social Worker)  Extended Emergency Contact Information Primary Emergency Contact: Crawford Givens          [place] Phone: [phone] Mobile Phone: [phone] Relation: Friend Secondary Emergency Contact: Kathryne Hitch Address: [address]          [place], [place] [address] Johnnette Litter of [place] Phone: [phone] Mobile Phone: [phone] Relation: Friend  Code Status: DNR Goals of Care: Advanced Directive information Advanced Directives 11/09/2017  Does Patient Have a Medical Advance Directive? Yes  Type of Advance Directive Out of facility DNR (pink MOST or yellow form)  Does patient want to make changes to medical advance directive? No - Patient declined  Copy of [place] in Chart? No - copy requested  Would patient like information on creating a medical advance directive? -  Pre-existing out of facility DNR order (yellow form or pink MOST form) -      Chief Complaint  Patient presents with  . Readmit To SNF    Readmission Visit    HPI: Patient is a 77 y.o. female seen today for admission to  SNF for therapy and Long term Care. Patient stayed in the hospital from 04/06-04/11.For Hepatic Encephalopathy. She has h/o COPD on Chronic Oxygen, Diabetes Mellitus, Liver Cirrhosis with Portal  Hypertension and esophageal Varices, Hypernatremia  She was in the facility when was found to be Obtunded and Change in Mental Status was send to the hospital.Her ammonia Level was 116 and she had Right Upper lobe Infiltrate. Her CT Scan was negative negative for any acute changes. Her Mental status improved with Lactulose and Rifaximin And IV antibiotics. Her lasix and Aldactone was also Resumed. She is back in Facility for Long term care. Per Nurses patient is refusing some of her Meds. When asked from the patient she started to cry and says all these meds make him go to bathroom and she does not like it. She finally did take her meds. Per nurses this has been her pattern in the facility of Non Compliance. She denied any SOB or cough. No Nausea, Fever or Abdominal Pain.  Past Medical History:  Diagnosis Date  . Abdominal aortic aneurysm ([facility]) 10/01/2013   Fall of 2014 3.3 per patient, follows with Vascular surgeon.   . Acute respiratory failure with hypoxia ([place]) 07/31/2015  . Allergic state 11/10/2016  . Anxiety   . Anxiety and depression 02/01/2014  . Arthritis of both knees 10/01/2013  . Arthritis of right knee 10/01/2013   Follows with Dr Mayer Camel   . Benign paroxysmal positional vertigo 10/01/2013  . Breast cancer ([place])    No disease activity On Femara Follows with Dr Marin Olp Right mastectomy performed by Dr Autumn Messing   . Cancer [facility]) breast ca  right  . Chronic respiratory failure ([place]) 08/29/2015  . Cirrhosis of liver without ascites ([place]) 07/31/2015  . COPD (chronic obstructive pulmonary disease) ([place]) 10/01/2013  .  COPD with acute exacerbation ([place]) 05/05/2017  . Depression   . Dermatitis 03/30/2017  . Diabetes mellitus type 2  . Diabetes mellitus type 2, controlled ([place]) 10/16/2014  . Emphysema   . Encephalopathy, hepatic ([place]) 06/07/2014  . Esophageal reflux 10/01/2013  . Fall 07/30/2016  . Hyperlipidemia   . Hyperlipidemia, mixed   . Increased ammonia level 11/25/2014  . NASH (nonalcoholic  steatohepatitis) 08/26/2015  . Neck pain 10/01/2013  . Neuropathy    feet   . Osteopenia 03/30/2017  . Overactive bladder 12/10/2013  . Panic attacks   . Pedal edema 12/10/2013  . Personal history of radiation therapy   . Preventative health care 03/08/2016  . Thrombocytopenia ([place]) 03/17/2012  . Tobacco abuse disorder 02/01/2014  . Type 2 diabetes mellitus with hyperglycemia, with long-term current use of insulin ([place])   . Urine frequency 04/13/2017   Past Surgical History:  Procedure Laterality Date  . APPENDECTOMY  2007  . BREAST SURGERY  2009 right  . CATARACT EXTRACTION     x 2  . ESOPHAGOGASTRODUODENOSCOPY (EGD) WITH PROPOFOL N/A 08/14/2016   Procedure: ESOPHAGOGASTRODUODENOSCOPY (EGD) WITH PROPOFOL;  Surgeon: Mauri Pole, MD;  Location: [facility] ENDOSCOPY;  Service: Endoscopy;  Laterality: N/A;  . [place]  . KNEE SURGERY    . MANDIBLE FRACTURE SURGERY    . MASTECTOMY    . PILONIDAL CYST EXCISION    . TONSILLECTOMY      reports that she has quit smoking. Her smoking use included cigarettes. She started smoking about 53 years ago. She has a 14.50 pack-year smoking history. She has never used smokeless tobacco. She reports that she does not drink alcohol or use drugs. Social History   Socioeconomic History  . Marital status: Single    Spouse name: Not on file  . Number of children: 0  . Years of education: Not on file  . Highest education level: Not on file  Occupational History  . Occupation: retired    Fish farm manager: RETIRED  Social Needs  . Financial resource strain: Not on file  . Food insecurity:    Worry: Not on file    Inability: Not on file  . Transportation needs:    Medical: Not on file    Non-medical: Not on file  Tobacco Use  . Smoking status: Former Smoker    Packs/day: 0.25    Years: 58.00    Pack years: 14.50    Types: Cigarettes    Start date: 07/27/1964  . Smokeless tobacco: Never Used  Substance and Sexual Activity  . Alcohol use: No     Alcohol/week: 0.0 oz  . Drug use: No  . Sexual activity: Never    Birth control/protection: Post-menopausal  Lifestyle  . Physical activity:    Days per week: Not on file    Minutes per session: Not on file  . Stress: Not on file  Relationships  . Social connections:    Talks on phone: Not on file    Gets together: Not on file    Attends religious service: Not on file    Active member of club or organization: Not on file    Attends meetings of clubs or organizations: Not on file    Relationship status: Not on file  . Intimate partner violence:    Fear of current or ex partner: Not on file    Emotionally abused: Not on file    Physically abused: Not on file    Forced sexual activity: Not  on file  Other Topics Concern  . Not on file  Social History Narrative   Lives alone,  no dietary restrictions.  States no longer smoking.    Functional Status Survey:    Family History  Problem Relation Age of Onset  . Heart failure Father   . COPD Father   . Arthritis Father 36  . Stroke Mother   . Arthritis Mother 13  . Hyperlipidemia Mother   . Hypertension Mother   . Diabetes Mother   . Diabetes Sister   . Breast cancer Unknown   . Breast cancer Maternal Aunt   . Asthma Maternal Aunt   . Birth defects Maternal Aunt   . Alcohol abuse Maternal Uncle   . Breast cancer Maternal Aunt   . Stomach cancer Neg Hx   . Colon cancer Neg Hx     Health Maintenance  Topic Date Due  . FOOT EXAM  12/07/2017 (Originally 11/24/2016)  . OPHTHALMOLOGY EXAM  12/07/2017 (Originally 05/06/2017)  . URINE MICROALBUMIN  12/07/2017 (Originally 02/24/2017)  . MAMMOGRAM  01/24/2018  . INFLUENZA VACCINE  02/24/2018  . HEMOGLOBIN A1C  05/01/2018  . COLONOSCOPY  07/27/2020  . TETANUS/TDAP  05/08/2024  . DEXA SCAN  Completed  . PNA vac Low Risk Adult  Completed    Allergies  Allergen Reactions  . Citalopram Palpitations    Irregular heart beat   . Ciprofloxacin Other (See Comments)    Mental  status change   . Erythromycin Other (See Comments)    Stomach cramps   . Glimepiride Other (See Comments)    Elevated ammonia levels   . Prednisone     Increased blood sugars too high    Allergies as of 11/09/2017      Reactions   Citalopram Palpitations   Irregular heart beat   Ciprofloxacin Other (See Comments)   Mental status change   Erythromycin Other (See Comments)   Stomach cramps   Glimepiride Other (See Comments)   Elevated ammonia levels   Prednisone    Increased blood sugars too high      Medication List        Accurate as of 11/09/17 11:59 PM. Always use your most recent med list.          acetaminophen 325 MG tablet Commonly known as:  TYLENOL Take 650 mg by mouth every 6 (six) hours as needed (pain).   albuterol 0.63 MG/3ML nebulizer solution Commonly known as:  ACCUNEB Take 1 ampule by nebulization every 6 (six) hours as needed for shortness of breath.   BIOFREEZE 4 % Gel Generic drug:  Menthol (Topical Analgesic) Apply 1 application topically 2 (two) times daily. For arthritic knee pain   bisacodyl 10 MG suppository Commonly known as:  DULCOLAX Place 10 mg rectally daily as needed (constipation not relieved by MOM).   FLEET ENEMA RE Place 1 each rectally daily as needed (constipation not relieved by MOM or bisacodyl).   FLONASE ALLERGY RELIEF 50 MCG/ACT nasal spray Generic drug:  fluticasone Place 1 spray into both nostrils daily.   furosemide 80 MG tablet Commonly known as:  LASIX Take 80 mg by mouth daily.   insulin aspart 100 UNIT/ML FlexPen Commonly known as:  NOVOLOG Inject 0-15 Units into the skin 3 (three) times daily with meals. Inject 0-15 units subcutaneously three times daily before meals and at bedtime per sliding scale: CBG 70-120 0 units, 251-150 2 units, 151-200 3 units, 201-250 5 units, 251-300 8 units, 301-350 11 units, 351-400  15 units, >400 call MD and give 15 units   insulin glargine 100 UNIT/ML injection Commonly  known as:  LANTUS Inject 0.1 mLs (10 Units total) into the skin at bedtime.   lactulose 10 GM/15ML solution Commonly known as:  CHRONULAC Take 45 mLs (30 g total) by mouth 3 (three) times daily.   magnesium oxide 400 MG tablet Commonly known as:  MAG-OX Take 400 mg by mouth 2 (two) times daily.   mometasone-formoterol 100-5 MCG/ACT Aero Commonly known as:  DULERA Inhale 2 puffs into the lungs 2 (two) times daily.   OXYGEN Inhale 2 L into the lungs continuous.   pantoprazole 20 MG tablet Commonly known as:  PROTONIX Take 20 mg by mouth daily.   rifaximin 550 MG Tabs tablet Commonly known as:  XIFAXAN Take 550 mg by mouth 2 (two) times daily.   sodium chloride 0.65 % Soln nasal spray Commonly known as:  OCEAN Place 2 sprays into both nostrils 2 (two) times daily as needed for congestion.   spironolactone 25 MG tablet Commonly known as:  ALDACTONE Take 25 mg by mouth daily.       Review of Systems  Constitutional: Positive for activity change. Negative for chills and fever.  HENT: Negative.   Eyes: Positive for redness and itching.  Respiratory: Negative.   Cardiovascular: Positive for leg swelling.  Gastrointestinal: Positive for abdominal distention and diarrhea.  Genitourinary: Positive for frequency.  Musculoskeletal: Negative.   Skin: Negative.   Neurological: Positive for weakness.  Psychiatric/Behavioral: Positive for dysphoric mood. The patient is nervous/anxious.     Vitals:   11/09/17 1852  BP: 118/67  Pulse: 75  Resp: 20  Temp: (!) 97 F (36.1 C)   There is no height or weight on file to calculate BMI. Physical Exam  Constitutional: She is oriented to person, place, and time. She appears well-developed.  HENT:  Head: Normocephalic.  Mouth/Throat: Oropharynx is clear and moist.  Eyes: Pupils are equal, round, and reactive to light.  Some redness in right eye  Neck: Neck supple.  Cardiovascular: Normal rate.  Murmur heard. Pulmonary/Chest:  Effort normal and breath sounds normal. She has no wheezes. She has no rales.  Abdominal: Soft. Bowel sounds are normal. She exhibits distension. There is no tenderness. There is no guarding.  Musculoskeletal:  Moderate Edema Bilateral.  Neurological: She is alert and oriented to person, place, and time.  Skin: Skin is warm and dry.  Psychiatric: Her mood appears anxious. Her speech is delayed. She is slowed. She exhibits a depressed mood.    Labs reviewed: Basic Metabolic Panel: Recent Labs    05/08/17 0421  10/19/17 0905 10/20/17 0857  11/02/17 0707 11/03/17 0412 11/04/17 0445  NA 137   < > 138 133*   < > 143 138 139  K 4.2   < > 4.2 4.3   < > 3.9 4.4 4.0  CL 103   < > 104 99*   < > 112* 106 105  CO2 25   < > 26 23   < > 27 22 26   GLUCOSE 185*   < > 210* 305*   < > 175* 196* 188*  BUN 46*   < > 50* 59*   < > 20 16 16   CREATININE 1.03*   < > 1.34* 1.51*   < > 0.89 0.77 0.84  CALCIUM 8.4*   < > 8.6* 9.0   < > 8.4* 8.5* 8.4*  MG 2.0  --  2.4 2.3  --   --   --   --  PHOS  --   --  3.7 3.0  --   --   --   --    < > = values in this interval not displayed.   Liver Function Tests: Recent Labs    10/15/17 10/18/17 0259 10/31/17 1038  AST 28 40 45*  ALT 19 22 38  ALKPHOS 191 187* 127*  BILITOT 1.2 3.8* 5.2*  PROT 5.1 5.7* 4.9*  ALBUMIN 2.2 2.3* 1.9*   No results for input(s): LIPASE, AMYLASE in the last 8760 hours. Recent Labs    11/02/17 0707 11/03/17 0412 11/04/17 0445  AMMONIA 41* 71* 23   CBC: Recent Labs    11/01/17 0327 11/02/17 0707 11/03/17 0412  WBC 9.4 8.9 8.9  NEUTROABS 6.8 6.0 6.5  HGB 10.6* 9.4* 10.3*  HCT 32.9* 29.7* 32.1*  MCV 94.8 95.8 95.0  PLT 105* 82* 85*   Cardiac Enzymes: Recent Labs    05/05/17 1853  TROPONINI <0.03   BNP: Invalid input(s): POCBNP Lab Results  Component Value Date   HGBA1C 6.7 (H) 10/30/2017   Lab Results  Component Value Date   TSH 1.16 11/10/2016   Lab Results  Component Value Date   VITAMINB12 644  10/16/2014   Lab Results  Component Value Date   FOLATE >20.0 10/16/2014   Lab Results  Component Value Date   IRON 54 07/31/2015   TIBC 344 07/31/2015   FERRITIN 16 07/31/2015    Imaging and Procedures obtained prior to SNF admission: Ct Head Wo Contrast  Result Date: 10/30/2017 CLINICAL DATA:  Acute presentation with altered mental status. Appended. Garbled speech. EXAM: CT HEAD WITHOUT CONTRAST TECHNIQUE: Contiguous axial images were obtained from the base of the skull through the vertex without intravenous contrast. COMPARISON:  08/31/2017.  08/12/2017.  11/25/2016. FINDINGS: Brain: Chronic atrophy and ventriculomegaly, stable over time. No sign of acute infarction, mass lesion, hemorrhage, hydrocephalus or extra-axial collection. Study does suffer from motion degradation. Vascular: There is atherosclerotic calcification of the major vessels at the base of the brain. Skull: Negative Sinuses/Orbits: Clear/normal Other: None IMPRESSION: No acute finding by CT.  Atrophy and chronic ventriculomegaly. Electronically Signed   By: Nelson Chimes M.D.   On: 10/30/2017 14:51   Dg Chest Portable 1 View  Result Date: 10/30/2017 CLINICAL DATA:  77 year old female with altered mental status EXAM: PORTABLE CHEST 1 VIEW COMPARISON:  Prior chest x-ray 10/20/2017 FINDINGS: Focal patchy airspace opacification in the periphery of the right upper lobe is new compared to prior and concerning for bronchopneumonia. Stable mild cardiomegaly. Atherosclerotic calcifications are again present in the thoracic aorta. Diffuse bronchitic changes and interstitial prominence are stable. No pneumothorax or pleural effusion. No acute osseous abnormality. IMPRESSION: Suspect right upper lobe bronchopneumonia. Electronically Signed   By: Jacqulynn Cadet M.D.   On: 10/30/2017 13:12    Assessment/Plan  Hepatic Encephalopathy with Liver Cirrhosis. It seems Patient refuses her Lactulose in Facility leading to worsening  Encephalopathy. She did well with treatment in the hospital .  D/W nurses to continue to keep up with her Bowel movements. Continue rifaximin GI follow up. Repeat Ammonia Level . Her Bilirubin was also high in the hospital. Will repeat Hepatic Panel also. Fluid restriction.with Low salt diet Has LE edema Continue Lasix and Aldactone. Follow weights.  Aspiration Pneumonitis Doing well now. Finished course of antibiotics. Infiltrate Resolved  Type 2 Diabetes Mellitus A1C 6.7 Continue Lantus and loose Sliding Scale. Will follow Accu checks.  COPD On Oxygen now Continue on Bronchodilators H/O CKD  Creat at baseline in the hospital GERD Continue on Protonix Depression with Anxiety Patient refuses her Meds and seemed very upset today. Will write for Psych Consult. Also Palliative team Consult. Prognosis is very poor.   Family/ staff Communication:   Labs/tests ordered: Hepatic panel, Ammonia Level and CBC  Total time spent in this patient care encounter was 45_ minutes; greater than 50% of the visit spent counseling patient, reviewing records , Labs and coordinating care for problems addressed at this encounter.

## 2017-11-10 ENCOUNTER — Encounter: Payer: Self-pay | Admitting: Internal Medicine

## 2017-11-12 DIAGNOSIS — Z1211 Encounter for screening for malignant neoplasm of colon: Secondary | ICD-10-CM | POA: Diagnosis not present

## 2017-11-12 DIAGNOSIS — K7460 Unspecified cirrhosis of liver: Secondary | ICD-10-CM | POA: Diagnosis not present

## 2017-11-12 DIAGNOSIS — D500 Iron deficiency anemia secondary to blood loss (chronic): Secondary | ICD-10-CM | POA: Diagnosis not present

## 2017-11-16 ENCOUNTER — Other Ambulatory Visit: Payer: Self-pay | Admitting: Licensed Clinical Social Worker

## 2017-11-16 NOTE — Patient Outreach (Signed)
[facility]) Care Management  11/16/2017  Ashley Savage 09-12-1940 [id number]  [facility] CSW arrived at [facility] in order to complete [facility] consult. [facility] CSW went to patient's room and saw that patient was sleeping. [facility] CSW did not wish to disturb patient and will re-attempt visit within the next two weeks.  [name], BSW, MSW, [email or web address] Phone: [phone] Fax: [phone]

## 2017-11-17 ENCOUNTER — Non-Acute Institutional Stay: Payer: Self-pay | Admitting: Internal Medicine

## 2017-11-17 DIAGNOSIS — S61402A Unspecified open wound of left hand, initial encounter: Secondary | ICD-10-CM | POA: Diagnosis not present

## 2017-11-17 DIAGNOSIS — R0602 Shortness of breath: Secondary | ICD-10-CM | POA: Diagnosis not present

## 2017-11-17 DIAGNOSIS — K7581 Nonalcoholic steatohepatitis (NASH): Secondary | ICD-10-CM

## 2017-11-17 DIAGNOSIS — R600 Localized edema: Secondary | ICD-10-CM | POA: Diagnosis not present

## 2017-11-17 DIAGNOSIS — Z515 Encounter for palliative care: Secondary | ICD-10-CM

## 2017-11-17 DIAGNOSIS — R531 Weakness: Secondary | ICD-10-CM | POA: Diagnosis not present

## 2017-11-18 ENCOUNTER — Other Ambulatory Visit: Payer: Self-pay | Admitting: Licensed Clinical Social Worker

## 2017-11-18 NOTE — Patient Outreach (Signed)
[facility]) Care Management  11/18/2017  Ashley Savage 10/06/1940 [id number]  Assessment- [facility] CSW arrived at [facility] to complete [facility] consult. THN CSW went to patient's room and found her in bed watching television. Patient reports that she is no longer having issues with frequent urinating but now she has had recent difficulty with frequent bowel movements (6-7 perday.)  Patient reports that swelling with her lower extremities has improved which is a relief for her. Patient shares that her appetite has been somewhat low. Patient denies being in any discomfort or pain at this time. Patient shares that she heard that her Medicaid will be approved in the next two weeks but was unable to state where she received this information. Patient continues to want LTC placement at [facility] and [facility] will follow patient until that is confirmed. Patient shares that she is very proud of herself because she was finally able to use her wheelchair and go outside to get some fresh air which was really improved her mood and alleviated her isolation and depressive symptoms. Patient reports that she was able to practice mindfulness and being the present but observing nature and the animals outside and use her senses to enjoy the outside. [facility] CSW provide positive reinforcement for this successful mindfulness implementation. [facility] CSW will continue to follow patient and provide social work support and assistance.  Plan-THN CSW will follow up within two-three weeks and will wait for LTC placement confirmation.  Ashley [facility], BSW, MSW, [email or web address] Phone: [phone] Fax: [phone]

## 2017-11-19 ENCOUNTER — Non-Acute Institutional Stay (SKILLED_NURSING_FACILITY): Payer: PPO | Admitting: Internal Medicine

## 2017-11-19 DIAGNOSIS — R609 Edema, unspecified: Secondary | ICD-10-CM

## 2017-11-19 DIAGNOSIS — R635 Abnormal weight gain: Secondary | ICD-10-CM

## 2017-11-19 NOTE — Progress Notes (Signed)
FOLLOW UP PALLIATIVE CARE CONSULT VISIT   PATIENT NAME: [facility] DOB: August 17, 1940 MRN: [id number]  POA:  [facility](niece) [phone]   REFERRING PROVIDER: Hennie Duos, MD [place], [place] [address]   RESPONSIBLE PARTY:  Acct ID - Guarantor Home Phone Work Phone Relationship Acct Type  [id number] Ashley [phone]  Self P/F     [address], [place], [place] [address]     BILLABLE ICD-10:  R 53.1, R06.02, R60.0,K75.81   RECOMMENDATIONS and PLAN:  1.  Generalized weakness:  Chronic state with additional decline.  Less mobile in wheelchair.  Encourage OOB activites 2.  Shortness of breath:  Related to chronic fluid retention.  Continue diuretic therapy and supplemental O2 use.   3.  Bilat. Lower extremity edema:  Ongoing.  Continue elevation and diuretics.  She would benefit from compression socks if she agrees to wear them.   4. NASH:  Chronic struggle with pt. Agreeing to take lactulose doses to maintain ammonia levels.  Discussed high probability of returning to hospital without her cooperation with healthcare plan.  5.  Open wound left hand:  Managed by wound care nurse.    I spent 30 minutes providing this consultation,  from 1:45pm to 2:15pm More than 50% of the time in this consultation was spent coordinating communication with pt., Dr. Sheppard Coil and facility staff.   HISTORY OF PRESENT ILLNESS:  Followup with [facility] finds that she is spending more time in the bed due to increased weakness and B&B incontinence.  Staff reports that she continues to intermittently decline taking Lactulose and diuretic due to frequency of elimination and incontinence.  She is requesting a foley catheter if possible.  CODE STATUS:   Code Status: DNR  ADVANCED DIRECTIVES: N  PPS: 40% HOSPICE ELIGIBILITY/DIAGNOSIS: To be determined.    PHYSICAL EXAM:   GEN: Chronically ill appearing elderly female reclining in bed. In NAD. LUNGS: decreased  breath sounds of bases.  O2 via [place] in use CARDIAC: Cor RRR and 2/6 Early systolic murmur   EXTREMITIES: 2+ edema to thigh level SKIN: Reported wound of the left dorsal hand but not seen due to bandages.  Mild erythema of anterior lower legs.  NEURO: A&O x3.  Generalized weakness    Gonzella Lex, NP

## 2017-11-25 ENCOUNTER — Non-Acute Institutional Stay (SKILLED_NURSING_FACILITY): Payer: PPO

## 2017-11-25 DIAGNOSIS — Z0000 Encounter for general adult medical examination without abnormal findings: Secondary | ICD-10-CM

## 2017-11-25 NOTE — Patient Instructions (Addendum)
[name] , Thank you for taking time to come for your Medicare Wellness Visit. I appreciate your ongoing commitment to your health goals. Please review the following plan we discussed and let me know if I can assist you in the future.   Screening recommendations/referrals: Colonoscopy up to date, due 07/27/2020 Mammogram up to date, due 01/24/2018 Bone Density up to date Recommended yearly ophthalmology/optometry visit for glaucoma screening and checkup Recommended yearly dental visit for hygiene and checkup  Vaccinations: Influenza vaccine up to date, due 2019 fall season Pneumococcal vaccine up to date, completed Tdap vaccine up to date, due 05/08/2014 Shingles vaccine not in past records  Advanced directives: In chart  Conditions/risks identified: none  Next appointment: Dr. Sheppard Coil makes rounds   Preventive Care 65 Years and Older, Female Preventive care refers to lifestyle choices and visits with your health care provider that can promote health and wellness. What does preventive care include?  A yearly physical exam. This is also called an annual well check.  Dental exams once or twice a year.  Routine eye exams. Ask your health care provider how often you should have your eyes checked.  Personal lifestyle choices, including:  Daily care of your teeth and gums.  Regular physical activity.  Eating a healthy diet.  Avoiding tobacco and drug use.  Limiting alcohol use.  Practicing safe sex.  Taking low-dose aspirin every day.  Taking vitamin and mineral supplements as recommended by your health care provider. What happens during an annual well check? The services and screenings done by your health care provider during your annual well check will depend on your age, overall health, lifestyle risk factors, and family history of disease. Counseling  Your health care provider may ask you questions about your:  Alcohol use.  Tobacco use.  Drug  use.  Emotional well-being.  Home and relationship well-being.  Sexual activity.  Eating habits.  History of falls.  Memory and ability to understand (cognition).  Work and work Statistician.  Reproductive health. Screening  You may have the following tests or measurements:  Height, weight, and BMI.  Blood pressure.  Lipid and cholesterol levels. These may be checked every 5 years, or more frequently if you are over 48 years old.  Skin check.  Lung cancer screening. You may have this screening every year starting at age 18 if you have a 30-pack-year history of smoking and currently smoke or have quit within the past 15 years.  Fecal occult blood test (FOBT) of the stool. You may have this test every year starting at age 42.  Flexible sigmoidoscopy or colonoscopy. You may have a sigmoidoscopy every 5 years or a colonoscopy every 10 years starting at age 88.  Hepatitis C blood test.  Hepatitis B blood test.  Sexually transmitted disease (STD) testing.  Diabetes screening. This is done by checking your blood sugar (glucose) after you have not eaten for a while (fasting). You may have this done every 1-3 years.  Bone density scan. This is done to screen for osteoporosis. You may have this done starting at age 77.  Mammogram. This may be done every 1-2 years. Talk to your health care provider about how often you should have regular mammograms. Talk with your health care provider about your test results, treatment options, and if necessary, the need for more tests. Vaccines  Your health care provider may recommend certain vaccines, such as:  Influenza vaccine. This is recommended every year.  Tetanus, diphtheria, and acellular pertussis (Tdap,  Td) vaccine. You may need a Td booster every 10 years.  Zoster vaccine. You may need this after age 50.  Pneumococcal 13-valent conjugate (PCV13) vaccine. One dose is recommended after age 66.  Pneumococcal polysaccharide  (PPSV23) vaccine. One dose is recommended after age 37. Talk to your health care provider about which screenings and vaccines you need and how often you need them. This information is not intended to replace advice given to you by your health care provider. Make sure you discuss any questions you have with your health care provider. Document Released: 08/09/2015 Document Revised: 04/01/2016 Document Reviewed: 05/14/2015 Elsevier Interactive Patient Education  2017 [place] Prevention in the Home Falls can cause injuries. They can happen to people of all ages. There are many things you can do to make your home safe and to help prevent falls. What can I do on the outside of my home?  Regularly fix the edges of walkways and driveways and fix any cracks.  Remove anything that might make you trip as you walk through a door, such as a raised step or threshold.  Trim any bushes or trees on the path to your home.  Use bright outdoor lighting.  Clear any walking paths of anything that might make someone trip, such as rocks or tools.  Regularly check to see if handrails are loose or broken. Make sure that both sides of any steps have handrails.  Any raised decks and porches should have guardrails on the edges.  Have any leaves, snow, or ice cleared regularly.  Use sand or salt on walking paths during winter.  Clean up any spills in your garage right away. This includes oil or grease spills. What can I do in the bathroom?  Use night lights.  Install grab bars by the toilet and in the tub and shower. Do not use towel bars as grab bars.  Use non-skid mats or decals in the tub or shower.  If you need to sit down in the shower, use a plastic, non-slip stool.  Keep the floor dry. Clean up any water that spills on the floor as soon as it happens.  Remove soap buildup in the tub or shower regularly.  Attach bath mats securely with double-sided non-slip rug tape.  Do not have  throw rugs and other things on the floor that can make you trip. What can I do in the bedroom?  Use night lights.  Make sure that you have a light by your bed that is easy to reach.  Do not use any sheets or blankets that are too big for your bed. They should not hang down onto the floor.  Have a firm chair that has side arms. You can use this for support while you get dressed.  Do not have throw rugs and other things on the floor that can make you trip. What can I do in the kitchen?  Clean up any spills right away.  Avoid walking on wet floors.  Keep items that you use a lot in easy-to-reach places.  If you need to reach something above you, use a strong step stool that has a grab bar.  Keep electrical cords out of the way.  Do not use floor polish or wax that makes floors slippery. If you must use wax, use non-skid floor wax.  Do not have throw rugs and other things on the floor that can make you trip. What can I do with my stairs?  Do not  leave any items on the stairs.  Make sure that there are handrails on both sides of the stairs and use them. Fix handrails that are broken or loose. Make sure that handrails are as long as the stairways.  Check any carpeting to make sure that it is firmly attached to the stairs. Fix any carpet that is loose or worn.  Avoid having throw rugs at the top or bottom of the stairs. If you do have throw rugs, attach them to the floor with carpet tape.  Make sure that you have a light switch at the top of the stairs and the bottom of the stairs. If you do not have them, ask someone to add them for you. What else can I do to help prevent falls?  Wear shoes that:  Do not have high heels.  Have rubber bottoms.  Are comfortable and fit you well.  Are closed at the toe. Do not wear sandals.  If you use a stepladder:  Make sure that it is fully opened. Do not climb a closed stepladder.  Make sure that both sides of the stepladder are  locked into place.  Ask someone to hold it for you, if possible.  Clearly mark and make sure that you can see:  Any grab bars or handrails.  First and last steps.  Where the edge of each step is.  Use tools that help you move around (mobility aids) if they are needed. These include:  Canes.  Walkers.  Scooters.  Crutches.  Turn on the lights when you go into a dark area. Replace any light bulbs as soon as they burn out.  Set up your furniture so you have a clear path. Avoid moving your furniture around.  If any of your floors are uneven, fix them.  If there are any pets around you, be aware of where they are.  Review your medicines with your doctor. Some medicines can make you feel dizzy. This can increase your chance of falling. Ask your doctor what other things that you can do to help prevent falls. This information is not intended to replace advice given to you by your health care provider. Make sure you discuss any questions you have with your health care provider. Document Released: 05/09/2009 Document Revised: 12/19/2015 Document Reviewed: 08/17/2014 Elsevier Interactive Patient Education  2017 [employer].

## 2017-11-25 NOTE — Progress Notes (Signed)
Subjective:   Ashley Savage is a 77 y.o. female who presents for Medicare Annual (Subsequent) preventive examination at [facility]  Last AWV-10/22/2016    Objective:     Vitals: BP 132/64 (BP Location: Left Arm, Patient Position: Sitting)   Pulse 75   Temp 97.9 F (36.6 C) (Oral)   Ht 5\' 6"  (1.676 m)   Wt 158 lb (71.7 kg)   LMP  (LMP Unknown)   SpO2 96%   BMI 25.50 kg/m   Body mass index is 25.5 kg/m.  Advanced Directives 11/25/2017 11/09/2017 11/05/2017 10/25/2017 10/18/2017 10/08/2017 10/07/2017  Does Patient Have a Medical Advance Directive? Yes Yes Yes No Yes Yes Yes  Type of Advance Directive Out of facility DNR (pink MOST or yellow form) Out of facility DNR (pink MOST or yellow form) Out of facility DNR (pink MOST or yellow form) - Press photographer;Out of facility DNR (pink MOST or yellow form) [place];Out of facility DNR (pink MOST or yellow form) Out of facility DNR (pink MOST or yellow form)  Does patient want to make changes to medical advance directive? No - Patient declined No - Patient declined No - Patient declined No - Patient declined No - Patient declined No - Patient declined No - Patient declined  Copy of [place] in Chart? No - copy requested No - copy requested - - No - copy requested No - copy requested -  Would patient like information on creating a medical advance directive? - - - - - - -  Pre-existing out of facility DNR order (yellow form or pink MOST form) Yellow form placed in chart (order not valid for inpatient use) - Yellow form placed in chart (order not valid for inpatient use) - Pink MOST form placed in chart (order not valid for inpatient use);Yellow form placed in chart (order not valid for inpatient use) - -    Tobacco Social History   Tobacco Use  Smoking Status Former Smoker  . Packs/day: 0.25  . Years: 58.00  . Pack years: 14.50  . Types: Cigarettes  . Start date: 07/27/1964    Smokeless Tobacco Never Used     Counseling given: Not Answered   Clinical Intake:     Pain : No/denies pain     Nutritional Risks: None Diabetes: Yes CBG done?: No Did pt. bring in CBG monitor from home?: No  How often do you need to have someone help you when you read instructions, pamphlets, or other written materials from your doctor or pharmacy?: 2 - Rarely  Interpreter Needed?: No  Information entered by :: Tyson Dense, RN  Past Medical History:  Diagnosis Date  . Abdominal aortic aneurysm ([place]) 10/01/2013   Fall of 2014 3.3 per patient, follows with Vascular surgeon.   . Acute respiratory failure with hypoxia ([place]) 07/31/2015  . Allergic state 11/10/2016  . Anxiety   . Anxiety and depression 02/01/2014  . Arthritis of both knees 10/01/2013  . Arthritis of right knee 10/01/2013   Follows with Dr Mayer Camel   . Benign paroxysmal positional vertigo 10/01/2013  . Breast cancer ([place])    No disease activity On Femara Follows with Dr Marin Olp Right mastectomy performed by Dr Autumn Messing   . Cancer [facility]) breast ca  right  . Chronic respiratory failure ([place]) 08/29/2015  . 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 COPD (chronic obstructive pulmonary disease) ([place]) 10/01/2013  . COPD with acute  exacerbation ([place]) 05/05/2017  . Depression   . Dermatitis 03/30/2017  . Diabetes mellitus type 2  . Diabetes mellitus type 2, controlled ([place]) 10/16/2014  . Emphysema   . Encephalopathy, hepatic ([place]) 06/07/2014  . Esophageal reflux 10/01/2013  . Fall 07/30/2016  . Hyperlipidemia   . Hyperlipidemia, mixed   . Increased ammonia level 11/25/2014  . NASH (nonalcoholic steatohepatitis) 08/26/2015  . Neck pain 10/01/2013  . Neuropathy    feet   . Osteopenia 03/30/2017  . Overactive bladder 12/10/2013  . Panic attacks   . Pedal edema 12/10/2013  . Personal history of radiation therapy   . Preventative health care 03/08/2016  . Thrombocytopenia ([place]) 03/17/2012  . Tobacco abuse disorder 02/01/2014  .  Type 2 diabetes mellitus with hyperglycemia, with long-term current use of insulin ([place])   . Urine frequency 04/13/2017   Past Surgical History:  Procedure Laterality Date  . APPENDECTOMY  2007  . BREAST SURGERY  2009 right  . CATARACT EXTRACTION     x 2  . ESOPHAGOGASTRODUODENOSCOPY (EGD) WITH PROPOFOL N/A 08/14/2016   Procedure: ESOPHAGOGASTRODUODENOSCOPY (EGD) WITH PROPOFOL;  Surgeon: Mauri Pole, MD;  Location: [facility] END[facility]COPY;  Service: Endoscopy;  Laterality: N/A;  . [place]  . KNEE SURGERY    . MANDIBLE FRACTURE SURGERY    . MASTECTOMY    . PILONIDAL CYST EXCISION    . TONSILLECTOMY     Family History  Problem Relation Age of Onset  . Heart failure Father   . COPD Father   . Arthritis Father 44  . Stroke Mother   . Arthritis Mother 72  . Hyperlipidemia Mother   . Hypertension Mother   . Diabetes Mother   . Diabetes Sister   . Breast cancer Unknown   . Breast cancer Maternal Aunt   . Asthma Maternal Aunt   . Birth defects Maternal Aunt   . Alcohol abuse Maternal Uncle   . Breast cancer Maternal Aunt   . Stomach cancer Neg Hx   . Colon cancer Neg Hx    Social History   Socioeconomic History  . Marital status: Single    Spouse name: Not on file  . Number of children: 0  . Years of education: Not on file  . Highest education level: Not on file  Occupational History  . Occupation: retired    Fish farm manager: RETIRED  Social Needs  . Financial resource strain: Not hard at all  . Food insecurity:    Worry: Never true    Inability: Never true  . Transportation needs:    Medical: No    Non-medical: No  Tobacco Use  . Smoking status: Former Smoker    Packs/day: 0.25    Years: 58.00    Pack years: 14.50    Types: Cigarettes    Start date: 07/27/1964  . Smokeless tobacco: Never Used  Substance and Sexual Activity  . Alcohol use: No    Alcohol/week: 0.0 oz  . Drug use: No  . Sexual activity: Never    Birth control/protection: Post-menopausal    Lifestyle  . Physical activity:    Days per week: 7 days    Minutes per session: 20 min  . Stress: To some extent  Relationships  . Social connections:    Talks on phone: Once a week    Gets together: Once a week    Attends religious service: Never    Active member of club or organization: No    Attends meetings of  clubs or organizations: Never    Relationship status: Never married  Other Topics Concern  . Not on file  Social History Narrative   Lives alone,  no dietary restrictions.  States no longer smoking.    Outpatient Encounter Medications as of 11/25/2017  Medication Sig  . acetaminophen (TYLENOL) 325 MG tablet Take 650 mg by mouth every 6 (six) hours as needed (pain).   Marland Kitchen albuterol (ACCUNEB) 0.63 MG/3ML nebulizer solution Take 1 ampule by nebulization every 6 (six) hours as needed for shortness of breath.  . bisacodyl (DULCOLAX) 10 MG suppository Place 10 mg rectally daily as needed (constipation not relieved by MOM).   . fluticasone (FLONASE ALLERGY RELIEF) 50 MCG/ACT nasal spray Place 1 spray into both nostrils daily.  . furosemide (LASIX) 80 MG tablet Take 80 mg by mouth daily.  . insulin aspart (NOVOLOG) 100 UNIT/ML FlexPen Inject 0-15 Units into the skin 3 (three) times daily with meals. Inject 0-15 units subcutaneously three times daily before meals and at bedtime per sliding scale: CBG 70-120 0 units, 251-150 2 units, 151-200 3 units, 201-250 5 units, 251-300 8 units, 301-350 11 units, 351-400 15 units, >400 call MD and give 15 units  . insulin glargine (LANTUS) 100 UNIT/ML injection Inject 0.1 mLs (10 Units total) into the skin at bedtime.  Marland Kitchen lactulose (CHRONULAC) 10 GM/15ML solution Take 45 mLs (30 g total) by mouth 3 (three) times daily.  . magnesium oxide (MAG-OX) 400 MG tablet Take 400 mg by mouth 2 (two) times daily.  . Menthol, Topical Analgesic, (BIOFREEZE) 4 % GEL Apply 1 application topically 2 (two) times daily. For arthritic knee pain  . mometasone-formoterol  (DULERA) 100-5 MCG/ACT AERO Inhale 2 puffs into the lungs 2 (two) times daily.  . OXYGEN Inhale 2 L into the lungs continuous.  . pantoprazole (PROTONIX) 20 MG tablet Take 20 mg by mouth daily.  . rifaximin (XIFAXAN) 550 MG TABS tablet Take 550 mg by mouth 2 (two) times daily.  . sodium chloride (OCEAN) 0.65 % SOLN nasal spray Place 2 sprays into both nostrils 2 (two) times daily as needed for congestion.  . Sodium Phosphates (FLEET ENEMA RE) Place 1 each rectally daily as needed (constipation not relieved by MOM or bisacodyl).  Marland Kitchen spironolactone (ALDACTONE) 25 MG tablet Take 25 mg by mouth daily.   No facility-administered encounter medications on file as of 11/25/2017.     Activities of Daily Living In your present state of health, do you have any difficulty performing the following activities: 11/25/2017 10/18/2017  Hearing? N N  Vision? N N  Difficulty concentrating or making decisions? Tempie Donning  Walking or climbing stairs? Y Y  Dressing or bathing? Y Y  Doing errands, shopping? Tempie Donning  Preparing Food and eating ? Y -  Using the Toilet? Y -  In the past six months, have you accidently leaked urine? Y -  Do you have problems with loss of bowel control? Y -  Managing your Medications? Y -  Managing your Finances? Y -  Housekeeping or managing your Housekeeping? Y -  Some recent data might be hidden    Patient Care Team: Hennie Duos, MD as PCP - General (Internal Medicine) Dene Gentry, MD as Consulting Physician (Sports Medicine) Mauri Pole, MD as Consulting Physician (Gastroenterology) Rigoberto Noel, MD as Consulting Physician (Pulmonary Disease) Parrett, Fonnie Mu, NP as Nurse Practitioner (Pulmonary Disease) Volanda Napoleon, MD as Consulting Physician (Oncology) Greg Cutter, LCSW as [facility]  [place] [employer] (Licensed Holiday representative)    Assessment:   This is a routine wellness examination for [facility].  Exercise Activities and Dietary  recommendations Current Exercise Habits: Structured exercise class, Type of exercise: Other - see comments(physical therapy), Time (Minutes): 20, Frequency (Times/Week): 7, Weekly Exercise (Minutes/Week): 140, Exercise limited by: orthopedic condition(s)  Goals    . HEMOGLOBIN A1C < 7.0    . Increase physical activity     Increase activity slow and stop whenever you experience shortness of breath and/or pain        Fall Risk Fall Risk  11/25/2017 10/07/2017 05/18/2017 02/10/2017 12/07/2016  Falls in the past year? Yes No No No Yes  Comment - - - patient reports no falls within the last 3 months. -  Number falls in past yr: 2 or more - - - 1  Injury with Fall? No - - - Yes  Comment - - - - pelvis and spine misaligned  Risk Factor Category  - - - High Fall Risk High Fall Risk  Risk for fall due to : - - Impaired mobility History of fall(s) History of fall(s)  Risk for fall due to: Comment - - - - -  Follow up - - - Falls prevention discussed Falls prevention discussed   Is the patient's home free of loose throw rugs in walkways, pet beds, electrical cords, etc?   yes      Grab bars in the bathroom? yes      Handrails on the stairs?   yes      Adequate lighting?   yes  Timed Get Up and Go performed: Unable to perform  Depression Screen PHQ 2/9 Scores 11/25/2017 10/07/2017 05/18/2017 11/20/2016  PHQ - 2 Score 1 0 0 0     Cognitive Function MMSE - Mini Mental State Exam 10/22/2016 08/15/2014  Orientation to time 4 5  Orientation to Place 5 5  Registration 3 3  Attention/ Calculation 5 5  Recall 3 2  Language- name 2 objects 2 2  Language- repeat 1 1  Language- follow 3 step command 3 3  Language- read & follow direction 1 1  Write a sentence 1 1  Copy design 1 1  Total score 29 29     6CIT Screen 11/25/2017  What Year? 0 points  What month? 0 points  What time? 3 points  Count back from 20 0 points  Months in reverse 0 points  Repeat phrase 0 points  Total Score 3     Immunization History  Administered Date(s) Administered  . Influenza Split 04/09/2011, 03/27/2012, 03/27/2013  . Influenza, High Dose Seasonal PF 03/30/2017  . Influenza,inj,Quad PF,6+ Mos 03/26/2015  . Influenza-Unspecified 04/24/2014, 04/23/2016, 03/30/2017  . Pneumococcal Conjugate-13 02/01/2014  . Pneumococcal Polysaccharide-23 04/09/2011  . Td 07/27/2010  . Tdap 05/08/2014    Qualifies for Shingles Vaccine? Not in past records  Screening Tests Health Maintenance  Topic Date Due  . FOOT EXAM  12/07/2017 (Originally 11/24/2016)  . OPHTHALMOLOGY EXAM  12/07/2017 (Originally 05/06/2017)  . URINE MICROALBUMIN  12/07/2017 (Originally 02/24/2017)  . MAMMOGRAM  01/24/2018  . INFLUENZA VACCINE  02/24/2018  . HEMOGLOBIN A1C  05/01/2018  . COLONOSCOPY  07/27/2020  . TETANUS/TDAP  05/08/2024  . DEXA SCAN  Completed  . PNA vac Low Risk Adult  Completed    Cancer Screenings: Lung: Low Dose CT Chest recommended if Age 82-80 years, 30 pack-year currently smoking OR have quit w/in 15years. Patient does qualify.  Breast:  Up to date on Mammogram? Yes   Up to date of Bone Density/Dexa? Yes Colorectal: up to date  Additional Screenings:  Hepatitis C Screening: declined Diabetic eye exam due-ordered    Plan:    I have personally reviewed and addressed the Medicare Annual Wellness questionnaire and have noted the following in the patient's chart:  A. Medical and social history B. Use of alcohol, tobacco or illicit drugs  C. Current medications and supplements D. Functional ability and status E.  Nutritional status F.  Physical activity G. Advance directives H. List of other physicians I.  Hospitalizations, surgeries, and ER visits in previous 12 months J.  [place] to include hearing, vision, cognitive, depression L. Referrals and appointments - none  In addition, I have reviewed and discussed with patient certain preventive protocols, quality metrics, and best  practice recommendations. A written personalized care plan for preventive services as well as general preventive health recommendations were provided to patient.  See attached scanned questionnaire for additional information.   Signed,   Tyson Dense, RN Nurse Health Advisor  Patient Concerns: None

## 2017-11-30 ENCOUNTER — Encounter: Payer: Self-pay | Admitting: Internal Medicine

## 2017-11-30 ENCOUNTER — Non-Acute Institutional Stay (SKILLED_NURSING_FACILITY): Payer: PPO | Admitting: Internal Medicine

## 2017-11-30 DIAGNOSIS — Z794 Long term (current) use of insulin: Secondary | ICD-10-CM

## 2017-11-30 DIAGNOSIS — E1165 Type 2 diabetes mellitus with hyperglycemia: Secondary | ICD-10-CM | POA: Diagnosis not present

## 2017-11-30 DIAGNOSIS — D696 Thrombocytopenia, unspecified: Secondary | ICD-10-CM

## 2017-11-30 DIAGNOSIS — K7290 Hepatic failure, unspecified without coma: Secondary | ICD-10-CM | POA: Diagnosis not present

## 2017-11-30 DIAGNOSIS — K7682 Hepatic encephalopathy: Secondary | ICD-10-CM

## 2017-11-30 NOTE — Progress Notes (Signed)
Location:  [facility] Room Number: [phone] Place of Service:  SNF [phone])  Ashley Duos, MD  Patient Care Team: Ashley Duos, MD as PCP - General (Internal Medicine) Dene Gentry, MD as Consulting Physician (Sports Medicine) Mauri Pole, MD as Consulting Physician (Gastroenterology) Rigoberto Noel, MD as Consulting Physician (Pulmonary Disease) Parrett, Fonnie Mu, NP as Nurse Practitioner (Pulmonary Disease) Volanda Napoleon, MD as Consulting Physician (Oncology)  Extended Emergency Contact Information Primary Emergency Contact: Lowella [place], [place] Phone: [phone] Mobile Phone: [phone] Relation: Friend Secondary Emergency Contact: Kathryne Hitch Address: [address]          [place], [place] [address] Johnnette Litter of [place] Phone: [phone] Mobile Phone: [phone] Relation: Friend    Allergies: Citalopram; Ciprofloxacin; Erythromycin; Glimepiride; and Prednisone  Chief Complaint  Patient presents with  . Medical Management of Chronic Issues    Routine Visit    HPI: Patient is 77 y.o. female who is being seen for routine issues of diabetes mellitus type 2, thrombocytopenia, and hepatic encephalopathy.  Past Medical History:  Diagnosis Date  . Abdominal aortic aneurysm ([place]) 10/01/2013   Fall of 2014 3.3 per patient, follows with Vascular surgeon.   . Acute respiratory failure with hypoxia ([place]) 07/31/2015  . Allergic state 11/10/2016  . Anxiety   . Anxiety and depression 02/01/2014  . Arthritis of both knees 10/01/2013  . Arthritis of right knee 10/01/2013   Follows with Dr Mayer Camel   . Benign paroxysmal positional vertigo 10/01/2013  . Breast cancer ([place])    No disease activity On Femara Follows with Dr Marin Olp Right mastectomy performed by Dr Autumn Messing   . Cancer [facility]) breast ca  right  . Chronic respiratory failure ([place]) 08/29/2015  . Cirrhosis of liver without ascites  ([place]) 07/31/2015  . COPD (chronic obstructive pulmonary disease) ([place]) 10/01/2013  . COPD with acute exacerbation ([place]) 05/05/2017  . Depression   . Dermatitis 03/30/2017  . Diabetes mellitus type 2  . Diabetes mellitus type 2, controlled ([place]) 10/16/2014  . Emphysema   . Encephalopathy, hepatic ([place]) 06/07/2014  . Esophageal reflux 10/01/2013  . Fall 07/30/2016  . Hyperlipidemia   . Hyperlipidemia, mixed   . Increased ammonia level 11/25/2014  . NASH (nonalcoholic steatohepatitis) 08/26/2015  . Neck pain 10/01/2013  . Neuropathy    feet   . Osteopenia 03/30/2017  . Overactive bladder 12/10/2013  . Panic attacks   . Pedal edema 12/10/2013  . Personal history of radiation therapy   . Preventative health care 03/08/2016  . Thrombocytopenia ([place]) 03/17/2012  . Tobacco abuse disorder 02/01/2014  . Type 2 diabetes mellitus with hyperglycemia, with long-term current use of insulin ([place])   . Urine frequency 04/13/2017    Past Surgical History:  Procedure Laterality Date  . APPENDECTOMY  2007  . BREAST SURGERY  2009 right  . CATARACT EXTRACTION     x 2  . ESOPHAGOGASTRODUODENOSCOPY (EGD) WITH PROPOFOL N/A 08/14/2016   Procedure: ESOPHAGOGASTRODUODENOSCOPY (EGD) WITH PROPOFOL;  Surgeon: Mauri Pole, MD;  Location: [facility];  Service: Endoscopy;  Laterality: N/A;  . [place]  . KNEE SURGERY    . MANDIBLE FRACTURE SURGERY    . MASTECTOMY    . PILONIDAL CYST EXCISION    . TONSILLECTOMY      Allergies as of 11/30/2017      Reactions  Citalopram Palpitations   Irregular heart beat   Ciprofloxacin Other (See Comments)   Mental status change   Erythromycin Other (See Comments)   Stomach cramps   Glimepiride Other (See Comments)   Elevated ammonia levels   Prednisone    Increased blood sugars too high      Medication List        Accurate as of 11/30/17 11:59 PM. Always use your most recent med list.          acetaminophen 325 MG tablet Commonly known as:   TYLENOL Take 650 mg by mouth every 6 (six) hours as needed (pain).   albuterol 0.63 MG/3ML nebulizer solution Commonly known as:  ACCUNEB Take 1 ampule by nebulization every 6 (six) hours as needed for shortness of breath.   BIOFREEZE 4 % Gel Generic drug:  Menthol (Topical Analgesic) Apply 1 application topically 2 (two) times daily. For arthritic knee pain   bisacodyl 10 MG suppository Commonly known as:  DULCOLAX Place 10 mg rectally daily as needed (constipation not relieved by MOM).   escitalopram 5 MG tablet Commonly known as:  LEXAPRO Take 5 mg by mouth daily.   FLEET ENEMA RE Place 1 each rectally daily as needed (constipation not relieved by MOM or bisacodyl).   FLONASE ALLERGY RELIEF 50 MCG/ACT nasal spray Generic drug:  fluticasone Place 1 spray into both nostrils daily.   furosemide 80 MG tablet Commonly known as:  LASIX Take 80 mg by mouth daily.   insulin aspart 100 UNIT/ML FlexPen Commonly known as:  NOVOLOG Inject 0-15 Units into the skin 3 (three) times daily with meals. Inject 0-15 units subcutaneously three times daily before meals and at bedtime per sliding scale: CBG 70-120 0 units, 251-150 2 units, 151-200 3 units, 201-250 5 units, 251-300 8 units, 301-350 11 units, 351-400 15 units, >400 call MD and give 15 units   insulin glargine 100 UNIT/ML injection Commonly known as:  LANTUS Inject 0.1 mLs (10 Units total) into the skin at bedtime.   lactulose 10 GM/15ML solution Commonly known as:  CHRONULAC Take 20 g by mouth 3 (three) times daily.   magnesium oxide 400 MG tablet Commonly known as:  MAG-OX Take 400 mg by mouth 2 (two) times daily.   mometasone-formoterol 100-5 MCG/ACT Aero Commonly known as:  DULERA Inhale 2 puffs into the lungs 2 (two) times daily.   OXYGEN Inhale 2 L into the lungs continuous.   pantoprazole 20 MG tablet Commonly known as:  PROTONIX Take 20 mg by mouth daily.   REFRESH OP Apply 1 drop to eye 2 (two) times  daily.   rifaximin 550 MG Tabs tablet Commonly known as:  XIFAXAN Take 550 mg by mouth 2 (two) times daily.   sodium chloride 0.65 % Soln nasal spray Commonly known as:  OCEAN Place 2 sprays into both nostrils 2 (two) times daily as needed for congestion.   spironolactone 25 MG tablet Commonly known as:  ALDACTONE Take 50 mg by mouth daily.       No orders of the defined types were placed in this encounter.   Immunization History  Administered Date(s) Administered  . Influenza Split 04/09/2011, 03/27/2012, 03/27/2013  . Influenza, High Dose Seasonal PF 03/30/2017  . Influenza,inj,Quad PF,6+ Mos 03/26/2015  . Influenza-Unspecified 04/24/2014, 04/23/2016, 03/30/2017  . Pneumococcal Conjugate-13 02/01/2014  . Pneumococcal Polysaccharide-23 04/09/2011  . Td 07/27/2010  . Tdap 05/08/2014    Social History   Tobacco Use  . Smoking status: Former Smoker  Packs/day: 0.25    Years: 58.00    Pack years: 14.50    Types: Cigarettes    Start date: 07/27/1964  . Smokeless tobacco: Never Used  Substance Use Topics  . Alcohol use: No    Alcohol/week: 0.0 oz    Review of Systems  DATA OBTAINED: from patient, nurse GENERAL:  no fevers, fatigue, appetite changes SKIN: No itching, rash HEENT: No complaint RESPIRATORY: No cough, wheezing, SOB CARDIAC: No chest pain, palpitations, lower extremity edema  GI: No abdominal pain, No N/V/D or constipation, No heartburn or reflux  GU: No dysuria, frequency or urgency, or incontinence  MUSCULOSKELETAL: No unrelieved bone/joint pain NEUROLOGIC: No headache, dizziness  PSYCHIATRIC: No overt anxiety or sadness  Vitals:   11/30/17 1031  BP: (!) 116/50  Pulse: 83  Resp: 20  Temp: 98 F (36.7 C)  SpO2: 94%   Body mass index is 27.58 kg/m. Physical Exam  GENERAL APPEARANCE: Alert, conversant, No acute distress  SKIN: No diaphoresis rash HEENT: Unremarkable RESPIRATORY: Breathing is even, unlabored. Lung sounds are clear    CARDIOVASCULAR: Heart RRR 3/6 murmur, no  rubs or gallops. 2+ peripheral edema, baseline GASTROINTESTINAL: Abdomen is soft, non-tender, not distended w/ normal bowel sounds.  GENITOURINARY: Bladder non tender, not distended  MUSCULOSKELETAL: No abnormal joints or musculature NEUROLOGIC: Cranial nerves 2-12 grossly intact. Moves all extremities PSYCHIATRIC: Mood and affect appropriate to situation, no behavioral issues  Patient Active Problem List   Diagnosis Date Noted  . Aspiration pneumonia ([place]) 12/11/2017  . Klebsiella cystitis 12/11/2017  . Depression 12/11/2017  . Hypomagnesemia 11/06/2017  . Hepatic encephalopathy ([place]) 10/30/2017  . Abnormal urinalysis 10/30/2017  . Right upper lobe pneumonia ([place]) 10/30/2017  . Acute respiratory failure with hypoxia and hypercapnia ([place]) 10/26/2017  . Acute pulmonary edema ([place]) 10/26/2017  . Acute metabolic encephalopathy 50/53/9767  . Liver cirrhosis secondary to NASH ([place]) 10/26/2017  . Sepsis ([place])   . Acute on chronic respiratory failure with hypoxia ([place]) 10/18/2017  . Bilateral lower extremity edema 10/02/2017  . Arthritis 10/02/2017  . COPD exacerbation ([place]) 06/25/2017  . Ventricular tachycardia ([place]) 05/22/2017  . Acute kidney injury ([place]) 05/17/2017  . Hyperkalemia 05/17/2017  . Vitamin D deficiency 05/17/2017  . COPD with acute exacerbation ([place]) 05/05/2017  . Urine frequency 04/13/2017  . Osteopenia 03/30/2017  . Dermatitis 03/30/2017  . Allergic state 11/10/2016  . 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 Portal hypertensive gastropathy ([place])   . Lower back injury, initial encounter 08/05/2016  . Fall 07/30/2016  . Preventative health care 03/08/2016  . Muscle spasm 02/25/2016  . Chronic respiratory failure ([place]) 08/29/2015  . Type 2 diabetes mellitus with hyperglycemia, with long-term current use of insulin ([place])   . Cirrhosis of liver without ascites ([place]) 07/31/2015  . Acute respiratory failure with hypoxia  ([place]) 07/31/2015  . Diarrhea 12/30/2014  . Encephalopathy, hepatic ([place]) 06/07/2014  . Right knee pain 06/07/2014  . Anxiety and depression 02/01/2014  . Tobacco abuse 02/01/2014  . Medicare annual wellness visit, subsequent 02/01/2014  . Overactive bladder 12/10/2013  . Abdominal aortic aneurysm ([place]) 10/01/2013  . Arthritis of right knee 10/01/2013  . Benign paroxysmal positional vertigo 10/01/2013  . Neck pain 10/01/2013  . Esophageal reflux 10/01/2013  . Thrombocytopenia ([place]) 03/17/2012  . 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   . Hyperlipidemia, mixed   . Breast cancer (HCC)     CMP     Component Value Date/Time   NA 138  12/14/2017   NA 140 10/15/2017   NA 141 07/07/2017 1102   NA 139 06/29/2016 1128   K 4.3 12/14/2017   K 4.8 10/15/2017   K 5.0 06/29/2016 1128   CL 105 11/04/2017 0445   CL 99 07/07/2017 1102   CO2 26 11/04/2017 0445   CO2 30 07/07/2017 1102   CO2 24 06/29/2016 1128   GLUCOSE 188 (H) 11/04/2017 0445   GLUCOSE 178 (H) 07/07/2017 1102   BUN 18 12/14/2017   BUN 22 07/07/2017 1102   BUN 15.9 06/29/2016 1128   CREATININE 0.6 12/14/2017   CREATININE 0.84 11/04/2017 0445   CREATININE 0.89 10/15/2017   CREATININE 1.1 06/29/2016 1128   CALCIUM 8.4 (L) 11/04/2017 0445   CALCIUM 8.5 10/15/2017   CALCIUM 9.0 06/29/2016 1128   PROT 4.9 (L) 10/31/2017 1038   PROT 5.1 10/15/2017   PROT 5.3 (L) 07/07/2017 1102   PROT 5.9 (L) 06/29/2016 1128   ALBUMIN 1.9 (L) 10/31/2017 1038   ALBUMIN 2.2 10/15/2017   ALBUMIN 2.7 (L) 06/29/2016 1128   AST 45 (H) 10/31/2017 1038   AST 28 10/15/2017   AST 36 (H) 06/29/2016 1128   ALT 38 10/31/2017 1038   ALT 19 10/15/2017   ALT 44 07/07/2017 1102   ALT 24 06/29/2016 1128   ALKPHOS 127 (H) 10/31/2017 1038   ALKPHOS 191 10/15/2017   ALKPHOS 129 06/29/2016 1128   BILITOT 5.2 (H) 10/31/2017 1038   BILITOT 1.2 10/15/2017   BILITOT 1.78 (H) 06/29/2016 1128   GFRNONAA >60 11/04/2017 0445    GFRAA >60 11/04/2017 0445   Recent Labs    05/08/17 0421  10/19/17 0905 10/20/17 0857  11/02/17 0707 11/03/17 0412 11/04/17 0445 12/14/17  NA 137   < > 138 133*   < > 143 138 139 138  K 4.2   < > 4.2 4.3   < > 3.9 4.4 4.0 4.3  CL 103   < > 104 99*   < > 112* 106 105  --   CO2 25   < > 26 23   < > 27 22 26   --   GLUCOSE 185*   < > 210* 305*   < > 175* 196* 188*  --   BUN 46*   < > 50* 59*   < > 20 16 16 18   CREATININE 1.03*   < > 1.34* 1.51*   < > 0.89 0.77 0.84 0.6  CALCIUM 8.4*   < > 8.6* 9.0   < > 8.4* 8.5* 8.4*  --   MG 2.0  --  2.4 2.3  --   --   --   --   --   PHOS  --   --  3.7 3.0  --   --   --   --   --    < > = values in this interval not displayed.   Recent Labs    10/15/17 10/18/17 0259 10/31/17 1038  AST 28 40 45*  ALT 19 22 38  ALKPHOS 191 187* 127*  BILITOT 1.2 3.8* 5.2*  PROT 5.1 5.7* 4.9*  ALBUMIN 2.2 2.3* 1.9*   Recent Labs    11/01/17 0327 11/02/17 0707 11/03/17 0412 12/14/17  WBC 9.4 8.9 8.9 8.3  NEUTROABS 6.8 6.0 6.5  --   HGB 10.6* 9.4* 10.3* 9.7*  HCT 32.9* 29.7* 32.1* 29*  MCV 94.8 95.8 95.0  --   PLT 105* 82* 85* 91*   Recent Labs  02/09/17 1222  CHOL 160  LDLCALC 96  TRIG 78.0   Lab Results  Component Value Date   MICROALBUR <0.7 02/25/2016   Lab Results  Component Value Date   TSH 1.16 11/10/2016   Lab Results  Component Value Date   HGBA1C 6.7 (H) 10/30/2017   Lab Results  Component Value Date   CHOL 160 02/09/2017   HDL 48.40 02/09/2017   LDLCALC 96 02/09/2017   TRIG 78.0 02/09/2017   CHOLHDL 3 02/09/2017    Significant Diagnostic Results in last 30 days:  No results found.  Assessment and Plan  Type 2 diabetes mellitus with hyperglycemia, with long-term current use of insulin (HCC) Hemoglobin A1c 6.1; continue Lantus and NovoLog insulin  Thrombocytopenia (HCC) Asymptomatic; and  remain in the 90-100 range; will monitor at intervals  Hepatic encephalopathy (HCC) Mental status has remained clear,  despite the fact that she occasionally misses her lactulose; she has continued to do well on her decrease lactulose since her last hospitalization; will continue lactulose 30 g 3 times daily     Anne D. Sheppard Coil, MD

## 2017-12-01 ENCOUNTER — Non-Acute Institutional Stay (SKILLED_NURSING_FACILITY): Payer: PPO | Admitting: Internal Medicine

## 2017-12-01 ENCOUNTER — Encounter: Payer: Self-pay | Admitting: Internal Medicine

## 2017-12-01 DIAGNOSIS — R0682 Tachypnea, not elsewhere classified: Secondary | ICD-10-CM | POA: Diagnosis not present

## 2017-12-01 DIAGNOSIS — J440 Chronic obstructive pulmonary disease with acute lower respiratory infection: Secondary | ICD-10-CM | POA: Diagnosis not present

## 2017-12-01 DIAGNOSIS — B961 Klebsiella pneumoniae [K. pneumoniae] as the cause of diseases classified elsewhere: Secondary | ICD-10-CM | POA: Diagnosis not present

## 2017-12-01 DIAGNOSIS — K7581 Nonalcoholic steatohepatitis (NASH): Secondary | ICD-10-CM | POA: Diagnosis not present

## 2017-12-01 DIAGNOSIS — N390 Urinary tract infection, site not specified: Secondary | ICD-10-CM | POA: Diagnosis not present

## 2017-12-01 DIAGNOSIS — K219 Gastro-esophageal reflux disease without esophagitis: Secondary | ICD-10-CM | POA: Diagnosis not present

## 2017-12-01 DIAGNOSIS — R0781 Pleurodynia: Secondary | ICD-10-CM

## 2017-12-01 DIAGNOSIS — J9622 Acute and chronic respiratory failure with hypercapnia: Secondary | ICD-10-CM | POA: Diagnosis not present

## 2017-12-01 DIAGNOSIS — J189 Pneumonia, unspecified organism: Secondary | ICD-10-CM | POA: Diagnosis not present

## 2017-12-01 DIAGNOSIS — R197 Diarrhea, unspecified: Secondary | ICD-10-CM | POA: Diagnosis not present

## 2017-12-01 DIAGNOSIS — M6281 Muscle weakness (generalized): Secondary | ICD-10-CM | POA: Diagnosis not present

## 2017-12-01 DIAGNOSIS — Z7401 Bed confinement status: Secondary | ICD-10-CM | POA: Diagnosis not present

## 2017-12-01 DIAGNOSIS — E872 Acidosis: Secondary | ICD-10-CM | POA: Diagnosis not present

## 2017-12-01 DIAGNOSIS — R0789 Other chest pain: Secondary | ICD-10-CM | POA: Diagnosis not present

## 2017-12-01 DIAGNOSIS — J690 Pneumonitis due to inhalation of food and vomit: Secondary | ICD-10-CM | POA: Diagnosis not present

## 2017-12-01 DIAGNOSIS — J9612 Chronic respiratory failure with hypercapnia: Secondary | ICD-10-CM | POA: Diagnosis not present

## 2017-12-01 DIAGNOSIS — R262 Difficulty in walking, not elsewhere classified: Secondary | ICD-10-CM | POA: Diagnosis not present

## 2017-12-01 DIAGNOSIS — J181 Lobar pneumonia, unspecified organism: Secondary | ICD-10-CM | POA: Diagnosis not present

## 2017-12-01 DIAGNOSIS — E785 Hyperlipidemia, unspecified: Secondary | ICD-10-CM | POA: Diagnosis not present

## 2017-12-01 DIAGNOSIS — R1310 Dysphagia, unspecified: Secondary | ICD-10-CM | POA: Diagnosis not present

## 2017-12-01 DIAGNOSIS — K7460 Unspecified cirrhosis of liver: Secondary | ICD-10-CM | POA: Diagnosis not present

## 2017-12-01 DIAGNOSIS — R7881 Bacteremia: Secondary | ICD-10-CM | POA: Diagnosis not present

## 2017-12-01 DIAGNOSIS — I491 Atrial premature depolarization: Secondary | ICD-10-CM | POA: Diagnosis not present

## 2017-12-01 DIAGNOSIS — E1165 Type 2 diabetes mellitus with hyperglycemia: Secondary | ICD-10-CM | POA: Diagnosis not present

## 2017-12-01 DIAGNOSIS — Z794 Long term (current) use of insulin: Secondary | ICD-10-CM | POA: Diagnosis not present

## 2017-12-01 DIAGNOSIS — Z87891 Personal history of nicotine dependence: Secondary | ICD-10-CM | POA: Diagnosis not present

## 2017-12-01 DIAGNOSIS — Z66 Do not resuscitate: Secondary | ICD-10-CM | POA: Diagnosis not present

## 2017-12-01 DIAGNOSIS — J441 Chronic obstructive pulmonary disease with (acute) exacerbation: Secondary | ICD-10-CM | POA: Diagnosis not present

## 2017-12-01 DIAGNOSIS — M2550 Pain in unspecified joint: Secondary | ICD-10-CM | POA: Diagnosis not present

## 2017-12-01 DIAGNOSIS — R1311 Dysphagia, oral phase: Secondary | ICD-10-CM | POA: Diagnosis not present

## 2017-12-01 DIAGNOSIS — J9611 Chronic respiratory failure with hypoxia: Secondary | ICD-10-CM | POA: Diagnosis not present

## 2017-12-01 DIAGNOSIS — Z9181 History of falling: Secondary | ICD-10-CM | POA: Diagnosis not present

## 2017-12-01 DIAGNOSIS — D696 Thrombocytopenia, unspecified: Secondary | ICD-10-CM | POA: Diagnosis not present

## 2017-12-01 DIAGNOSIS — R079 Chest pain, unspecified: Secondary | ICD-10-CM | POA: Diagnosis not present

## 2017-12-01 DIAGNOSIS — R072 Precordial pain: Secondary | ICD-10-CM | POA: Diagnosis not present

## 2017-12-01 DIAGNOSIS — Z825 Family history of asthma and other chronic lower respiratory diseases: Secondary | ICD-10-CM | POA: Diagnosis not present

## 2017-12-01 DIAGNOSIS — Z833 Family history of diabetes mellitus: Secondary | ICD-10-CM | POA: Diagnosis not present

## 2017-12-01 DIAGNOSIS — J9621 Acute and chronic respiratory failure with hypoxia: Secondary | ICD-10-CM | POA: Diagnosis not present

## 2017-12-01 NOTE — Progress Notes (Signed)
Location:  [place] Room Number: [place]:  SNF (31)  Hennie Duos, MD provider  Patient Care Team: Hennie Duos, MD as PCP - General (Internal Medicine) Dene Gentry, MD as Consulting Physician (Sports Medicine) Mauri Pole, MD as Consulting Physician (Gastroenterology) Rigoberto Noel, MD as Consulting Physician (Pulmonary Disease) Parrett, Fonnie Mu, NP as Nurse Practitioner (Pulmonary Disease) Volanda Napoleon, MD as Consulting Physician (Oncology) Greg Cutter, LCSW as [place] [facility] (Licensed Clinical Social Worker)  Extended Emergency Contact Information Primary Emergency Contact: Crawford Givens          [place] Phone: [phone] Mobile Phone: [phone] Relation: Friend Secondary Emergency Contact: Kathryne Hitch Address: [address]          [place],  [address] [place] Phone: [phone] Mobile Phone: [phone] Relation: Friend    Allergies: Citalopram; Ciprofloxacin; Erythromycin; Glimepiride; and Prednisone  Chief Complaint  Patient presents with  . Acute Visit    HPI: Patient is 77 y.o. female who is being asked me to see because patient is complaining of severe right-sided chest pain.  Patient admitted that chest pain started last night.  Patient was seen yesterday for routine visit and she was fine.  Pain is described as worse with breathing and coughing, also with moving.  Patient has not had any coughing fever or shortness of breath prior to today.  Patient did not have any coughing yesterday.  Past Medical History:  Diagnosis Date  . Abdominal aortic aneurysm ([place]) 10/01/2013   Fall of 2014 3.3 per patient, follows with Vascular surgeon.   . Acute respiratory failure with hypoxia ([place]) 07/31/2015  . Allergic state 11/10/2016  . Anxiety   . Anxiety and depression 02/01/2014  . Arthritis of both knees  10/01/2013  . Arthritis of right knee 10/01/2013   Follows with Dr Mayer Camel   . Benign paroxysmal positional vertigo 10/01/2013  . Breast cancer ([place])    No disease activity On Femara Follows with Dr Marin Olp Right mastectomy performed by Dr Autumn Messing   . Cancer [facility]) breast ca  right  . Chronic respiratory failure ([place]) 08/29/2015  . Cirrhosis of liver without ascites ([place]) 07/31/2015  . COPD (chronic obstructive pulmonary disease) ([place]) 10/01/2013  . COPD with acute exacerbation ([place]) 05/05/2017  . Depression   . Dermatitis 03/30/2017  . Diabetes mellitus type 2  . Diabetes mellitus type 2, controlled ([place]) 10/16/2014  . Emphysema   . Encephalopathy, hepatic ([place]) 06/07/2014  . Esophageal reflux 10/01/2013  . Fall 07/30/2016  . Hyperlipidemia   . Hyperlipidemia, mixed   . Increased ammonia level 11/25/2014  . NASH (nonalcoholic steatohepatitis) 08/26/2015  . Neck pain 10/01/2013  . Neuropathy    feet   . Osteopenia 03/30/2017  . Overactive bladder 12/10/2013  . Panic attacks   . Pedal edema 12/10/2013  . Personal history of radiation therapy   . Preventative health care 03/08/2016  . Thrombocytopenia ([place]) 03/17/2012  . Tobacco abuse disorder 02/01/2014  . Type 2 diabetes mellitus with hyperglycemia, with long-term current use of insulin ([place])   . Urine frequency 04/13/2017    Past Surgical History:  Procedure Laterality Date  . APPENDECTOMY  2007  . BREAST SURGERY  2009 right  . CATARACT EXTRACTION     x 2  . ESOPHAGOGASTRODUODENOSCOPY (EGD) WITH PROPOFOL N/A 08/14/2016   Procedure: ESOPHAGOGASTRODUODENOSCOPY (EGD) WITH PROPOFOL;  Surgeon: Karleen Hampshire  Bary Richard, MD;  Location: [facility] ENDOSCOPY;  Service: Endoscopy;  Laterality: N/A;  . [place]  . KNEE SURGERY    . MANDIBLE FRACTURE SURGERY    . MASTECTOMY    . PILONIDAL CYST EXCISION    . TONSILLECTOMY      Allergies as of 12/01/2017      Reactions   Citalopram Palpitations   Irregular heart beat   Ciprofloxacin Other (See Comments)     Mental status change   Erythromycin Other (See Comments)   Stomach cramps   Glimepiride Other (See Comments)   Elevated ammonia levels   Prednisone    Increased blood sugars too high      Medication List        Accurate as of 12/01/17  4:19 PM. Always use your most recent med list.          acetaminophen 325 MG tablet Commonly known as:  TYLENOL Take 650 mg by mouth every 6 (six) hours as needed (pain).   albuterol 0.63 MG/3ML nebulizer solution Commonly known as:  ACCUNEB Take 1 ampule by nebulization every 6 (six) hours as needed for shortness of breath.   BIOFREEZE 4 % Gel Generic drug:  Menthol (Topical Analgesic) Apply 1 application topically 2 (two) times daily. For arthritic knee pain   bisacodyl 10 MG suppository Commonly known as:  DULCOLAX Place 10 mg rectally daily as needed (constipation not relieved by MOM).   escitalopram 5 MG tablet Commonly known as:  LEXAPRO Take 5 mg by mouth daily.   FLEET ENEMA RE Place 1 each rectally daily as needed (constipation not relieved by MOM or bisacodyl).   FLONASE ALLERGY RELIEF 50 MCG/ACT nasal spray Generic drug:  fluticasone Place 1 spray into both nostrils daily.   furosemide 80 MG tablet Commonly known as:  LASIX Take 80 mg by mouth daily.   insulin aspart 100 UNIT/ML FlexPen Commonly known as:  NOVOLOG Inject 0-15 Units into the skin 3 (three) times daily with meals. Inject 0-15 units subcutaneously three times daily before meals and at bedtime per sliding scale: CBG 70-120 0 units, 251-150 2 units, 151-200 3 units, 201-250 5 units, 251-300 8 units, 301-350 11 units, 351-400 15 units, >400 call MD and give 15 units   insulin glargine 100 UNIT/ML injection Commonly known as:  LANTUS Inject 0.1 mLs (10 Units total) into the skin at bedtime.   lactulose 10 GM/15ML solution Commonly known as:  CHRONULAC Take 45 g by mouth 4 (four) times daily.   magnesium oxide 400 MG tablet Commonly known as:   MAG-OX Take 400 mg by mouth 2 (two) times daily.   mometasone-formoterol 100-5 MCG/ACT Aero Commonly known as:  DULERA Inhale 2 puffs into the lungs 2 (two) times daily.   OXYGEN Inhale 2 L into the lungs continuous.   pantoprazole 20 MG tablet Commonly known as:  PROTONIX Take 20 mg by mouth daily.   REFRESH OP Apply 1 drop to eye 2 (two) times daily.   rifaximin 550 MG Tabs tablet Commonly known as:  XIFAXAN Take 550 mg by mouth 2 (two) times daily.   sodium chloride 0.65 % Soln nasal spray Commonly known as:  OCEAN Place 2 sprays into both nostrils 2 (two) times daily as needed for congestion.   spironolactone 25 MG tablet Commonly known as:  ALDACTONE Take 25 mg by mouth daily.       No orders of the defined types were placed in this encounter.  Immunization History  Administered Date(s) Administered  . Influenza Split 04/09/2011, 03/27/2012, 03/27/2013  . Influenza, High Dose Seasonal PF 03/30/2017  . Influenza,inj,Quad PF,6+ Mos 03/26/2015  . Influenza-Unspecified 04/24/2014, 04/23/2016, 03/30/2017  . Pneumococcal Conjugate-13 02/01/2014  . Pneumococcal Polysaccharide-23 04/09/2011  . Td 07/27/2010  . Tdap 05/08/2014    Social History   Tobacco Use  . Smoking status: Former Smoker    Packs/day: 0.25    Years: 58.00    Pack years: 14.50    Types: Cigarettes    Start date: 07/27/1964  . Smokeless tobacco: Never Used  Substance Use Topics  . Alcohol use: No    Alcohol/week: 0.0 oz    Review of Systems  DATA OBTAINED: from patient, nurse per history of present illness GENERAL:  no fevers, fatigue, appetite changes SKIN: No itching, rash HEENT: No complaint RESPIRATORY: No cough, wheezing, SOB CARDIAC: + chest pain, palpitations, lower extremity edema  GI: No abdominal pain, No N/V/D or constipation, No heartburn or reflux  GU: No dysuria, frequency or urgency, or incontinence  MUSCULOSKELETAL: No unrelieved bone/joint pain NEUROLOGIC: No  headache, dizziness  PSYCHIATRIC: No overt anxiety or sadness  Vitals:   12/01/17 1609  BP: (!) 112/50  Pulse: 80  Resp: 20  Temp: 99 F (37.2 C)  SpO2: 92%   Body mass index is 27.57 kg/m. Physical Exam  GENERAL APPEARANCE: Alert, conversant, appears uncomfortable SKIN: No diaphoresis rash HEENT: Unremarkable RESPIRATORY: Breathing is even, mild labored. Lung sounds are decreased at right base CARDIOVASCULAR: Heart regular, 3/6 systolic murmur, no rubs or gallops. + peripheral edema  GASTROINTESTINAL: Abdomen is soft, non-tender, not distended w/ normal bowel sounds.  GENITOURINARY: Bladder non tender, not distended  MUSCULOSKELETAL: No abnormal joints or musculature NEUROLOGIC: Cranial nerves 2-12 grossly intact. Moves all extremities PSYCHIATRIC: Mood and affect appropriate to situation, no behavioral issues  Patient Active Problem List   Diagnosis Date Noted  . Hypomagnesemia 11/06/2017  . Hepatic encephalopathy ([place]) 10/30/2017  . Abnormal urinalysis 10/30/2017  . Right upper lobe pneumonia ([place]) 10/30/2017  . Acute respiratory failure with hypoxia and hypercapnia ([place]) 10/26/2017  . Acute pulmonary edema ([place]) 10/26/2017  . Acute metabolic encephalopathy 96/29/5284  . Liver cirrhosis secondary to NASH ([place]) 10/26/2017  . Sepsis ([place])   . Acute on chronic respiratory failure with hypoxia ([place]) 10/18/2017  . Bilateral lower extremity edema 10/02/2017  . Arthritis 10/02/2017  . COPD exacerbation ([place]) 06/25/2017  . Ventricular tachycardia ([place]) 05/22/2017  . Acute kidney injury ([place]) 05/17/2017  . Hyperkalemia 05/17/2017  . Vitamin D deficiency 05/17/2017  . COPD with acute exacerbation ([place]) 05/05/2017  . Urine frequency 04/13/2017  . Osteopenia 03/30/2017  . Dermatitis 03/30/2017  . Allergic state 11/10/2016  . 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 Portal hypertensive gastropathy ([place])   . Lower back injury, initial encounter 08/05/2016  . Fall  07/30/2016  . Preventative health care 03/08/2016  . Muscle spasm 02/25/2016  . Chronic respiratory failure ([place]) 08/29/2015  . Type 2 diabetes mellitus with hyperglycemia, with long-term current use of insulin ([place])   . Cirrhosis of liver without ascites ([place]) 07/31/2015  . Acute respiratory failure with hypoxia ([place]) 07/31/2015  . Diarrhea 12/30/2014  . Encephalopathy, hepatic ([place]) 06/07/2014  . Right knee pain 06/07/2014  . Anxiety and depression 02/01/2014  . Tobacco abuse 02/01/2014  . Medicare annual wellness visit, subsequent 02/01/2014  . Overactive bladder 12/10/2013  . Abdominal aortic aneurysm ([place]) 10/01/2013  . Arthritis of right knee 10/01/2013  .  Benign paroxysmal positional vertigo 10/01/2013  . Neck pain 10/01/2013  . Esophageal reflux 10/01/2013  . Thrombocytopenia ([place]) 03/17/2012  . 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   . Hyperlipidemia, mixed   . Breast cancer ([place])     CMP     Component Value Date/Time   NA 139 11/04/2017 0445   NA 141 10/27/2017   NA 140 10/15/2017   NA 141 07/07/2017 1102   NA 139 06/29/2016 1128   K 4.0 11/04/2017 0445   K 4.8 10/15/2017   K 5.0 06/29/2016 1128   CL 105 11/04/2017 0445   CL 99 07/07/2017 1102   CO2 26 11/04/2017 0445   CO2 30 07/07/2017 1102   CO2 24 06/29/2016 1128   GLUCOSE 188 (H) 11/04/2017 0445   GLUCOSE 178 (H) 07/07/2017 1102   BUN 16 11/04/2017 0445   BUN 21 10/27/2017   BUN 22 07/07/2017 1102   BUN 15.9 06/29/2016 1128   CREATININE 0.84 11/04/2017 0445   CREATININE 0.89 10/15/2017   CREATININE 1.1 06/29/2016 1128   CALCIUM 8.4 (L) 11/04/2017 0445   CALCIUM 8.5 10/15/2017   CALCIUM 9.0 06/29/2016 1128   PROT 4.9 (L) 10/31/2017 1038   PROT 5.1 10/15/2017   PROT 5.3 (L) 07/07/2017 1102   PROT 5.9 (L) 06/29/2016 1128   ALBUMIN 1.9 (L) 10/31/2017 1038   ALBUMIN 2.2 10/15/2017   ALBUMIN 2.7 (L) 06/29/2016 1128   AST 45 (H) 10/31/2017 1038   AST 28 10/15/2017    AST 36 (H) 06/29/2016 1128   ALT 38 10/31/2017 1038   ALT 19 10/15/2017   ALT 44 07/07/2017 1102   ALT 24 06/29/2016 1128   ALKPHOS 127 (H) 10/31/2017 1038   ALKPHOS 191 10/15/2017   ALKPHOS 129 06/29/2016 1128   BILITOT 5.2 (H) 10/31/2017 1038   BILITOT 1.2 10/15/2017   BILITOT 1.78 (H) 06/29/2016 1128   GFRNONAA >60 11/04/2017 0445   GFRAA >60 11/04/2017 0445   Recent Labs    05/08/17 0421  10/19/17 0905 10/20/17 0857  11/02/17 0707 11/03/17 0412 11/04/17 0445  NA 137   < > 138 133*   < > 143 138 139  K 4.2   < > 4.2 4.3   < > 3.9 4.4 4.0  CL 103   < > 104 99*   < > 112* 106 105  CO2 25   < > 26 23   < > 27 22 26   GLUCOSE 185*   < > 210* 305*   < > 175* 196* 188*  BUN 46*   < > 50* 59*   < > 20 16 16   CREATININE 1.03*   < > 1.34* 1.51*   < > 0.89 0.77 0.84  CALCIUM 8.4*   < > 8.6* 9.0   < > 8.4* 8.5* 8.4*  MG 2.0  --  2.4 2.3  --   --   --   --   PHOS  --   --  3.7 3.0  --   --   --   --    < > = values in this interval not displayed.   Recent Labs    10/15/17 10/18/17 0259 10/31/17 1038  AST 28 40 45*  ALT 19 22 38  ALKPHOS 191 187* 127*  BILITOT 1.2 3.8* 5.2*  PROT 5.1 5.7* 4.9*  ALBUMIN 2.2 2.3* 1.9*   Recent Labs    11/01/17 0327 11/02/17 0707 11/03/17 0412  WBC 9.4 8.9 8.9  NEUTROABS 6.8 6.0 6.5  HGB 10.6* 9.4* 10.3*  HCT 32.9* 29.7* 32.1*  MCV 94.8 95.8 95.0  PLT 105* 82* 85*   Recent Labs    02/09/17 1222  CHOL 160  LDLCALC 96  TRIG 78.0   Lab Results  Component Value Date   MICROALBUR <0.7 02/25/2016   Lab Results  Component Value Date   TSH 1.16 11/10/2016   Lab Results  Component Value Date   HGBA1C 6.7 (H) 10/30/2017   Lab Results  Component Value Date   CHOL 160 02/09/2017   HDL 48.40 02/09/2017   LDLCALC 96 02/09/2017   TRIG 78.0 02/09/2017   CHOLHDL 3 02/09/2017    Significant Diagnostic Results in last 30 days:  No results found.  Assessment and Plan  Right-sided chest pain pleuritic/tachypnea- patient has  not had any coughing or fever; patient's chest pain was relatively sudden in onset, as and she did not have it yesterday when I saw her for routine visit, and she increased respiratory rate, my real concern is to rule out pulmonary embolus, as her presentation is chest pain and no coughing and as patient has been extremely ill for a long time so PE has to be on the list.  These studies we cannot get in the nursing home, or even as an outpatient in a timely manner; will send to ED.    Time spent greater than 35 minutes Laurajean Hosek D. Sheppard Coil, MD

## 2017-12-02 ENCOUNTER — Other Ambulatory Visit: Payer: Self-pay | Admitting: Licensed Clinical Social Worker

## 2017-12-02 NOTE — Patient Outreach (Signed)
[facility] [facility]) Care Management  12/02/2017  AKINA MAISH 03/04/1941 [id number]  [facility] CSW completed call to [facility] social worker on 12/02/17 and was able to reach her successfully. SNF social worker reports that patient went to ED yesterday at [facility] and was admitted for shortness of breath and chest pain. Patient is still hospitalized at this point. SNF social worker informed [facility] CSW that patient is now receiving long term care services at facility. [facility] CSW informed her that this she will confirm this with staff and then will close case. [facility] CSW sent secure email to to Andreas Newport RN Utilization Management Post Acute Reviewer to confirm that patient was LTC and was informed that this information is correct. [facility] CSW will close case at this time as patient is now long term.  [name], BSW, MSW, [email or web address] Phone: [phone] Fax: [phone]

## 2017-12-07 DIAGNOSIS — J918 Pleural effusion in other conditions classified elsewhere: Secondary | ICD-10-CM | POA: Diagnosis not present

## 2017-12-07 DIAGNOSIS — Z9181 History of falling: Secondary | ICD-10-CM | POA: Diagnosis not present

## 2017-12-07 DIAGNOSIS — D696 Thrombocytopenia, unspecified: Secondary | ICD-10-CM | POA: Diagnosis not present

## 2017-12-07 DIAGNOSIS — J189 Pneumonia, unspecified organism: Secondary | ICD-10-CM | POA: Diagnosis not present

## 2017-12-07 DIAGNOSIS — K766 Portal hypertension: Secondary | ICD-10-CM | POA: Diagnosis not present

## 2017-12-07 DIAGNOSIS — E785 Hyperlipidemia, unspecified: Secondary | ICD-10-CM | POA: Diagnosis not present

## 2017-12-07 DIAGNOSIS — J441 Chronic obstructive pulmonary disease with (acute) exacerbation: Secondary | ICD-10-CM | POA: Diagnosis not present

## 2017-12-07 DIAGNOSIS — R1312 Dysphagia, oropharyngeal phase: Secondary | ICD-10-CM | POA: Diagnosis not present

## 2017-12-07 DIAGNOSIS — M6281 Muscle weakness (generalized): Secondary | ICD-10-CM | POA: Diagnosis not present

## 2017-12-07 DIAGNOSIS — R600 Localized edema: Secondary | ICD-10-CM | POA: Diagnosis not present

## 2017-12-07 DIAGNOSIS — J9621 Acute and chronic respiratory failure with hypoxia: Secondary | ICD-10-CM | POA: Diagnosis not present

## 2017-12-07 DIAGNOSIS — Z9011 Acquired absence of right breast and nipple: Secondary | ICD-10-CM | POA: Diagnosis not present

## 2017-12-07 DIAGNOSIS — J168 Pneumonia due to other specified infectious organisms: Secondary | ICD-10-CM | POA: Diagnosis not present

## 2017-12-07 DIAGNOSIS — Z7401 Bed confinement status: Secondary | ICD-10-CM | POA: Diagnosis not present

## 2017-12-07 DIAGNOSIS — F329 Major depressive disorder, single episode, unspecified: Secondary | ICD-10-CM | POA: Diagnosis not present

## 2017-12-07 DIAGNOSIS — Z825 Family history of asthma and other chronic lower respiratory diseases: Secondary | ICD-10-CM | POA: Diagnosis not present

## 2017-12-07 DIAGNOSIS — K7469 Other cirrhosis of liver: Secondary | ICD-10-CM | POA: Diagnosis not present

## 2017-12-07 DIAGNOSIS — M2550 Pain in unspecified joint: Secondary | ICD-10-CM | POA: Diagnosis not present

## 2017-12-07 DIAGNOSIS — R262 Difficulty in walking, not elsewhere classified: Secondary | ICD-10-CM | POA: Diagnosis not present

## 2017-12-07 DIAGNOSIS — N189 Chronic kidney disease, unspecified: Secondary | ICD-10-CM | POA: Diagnosis not present

## 2017-12-07 DIAGNOSIS — K7581 Nonalcoholic steatohepatitis (NASH): Secondary | ICD-10-CM | POA: Diagnosis not present

## 2017-12-07 DIAGNOSIS — T17590A Other foreign object in bronchus causing asphyxiation, initial encounter: Secondary | ICD-10-CM | POA: Diagnosis not present

## 2017-12-07 DIAGNOSIS — N179 Acute kidney failure, unspecified: Secondary | ICD-10-CM | POA: Diagnosis not present

## 2017-12-07 DIAGNOSIS — R069 Unspecified abnormalities of breathing: Secondary | ICD-10-CM | POA: Diagnosis not present

## 2017-12-07 DIAGNOSIS — E118 Type 2 diabetes mellitus with unspecified complications: Secondary | ICD-10-CM | POA: Diagnosis not present

## 2017-12-07 DIAGNOSIS — E872 Acidosis: Secondary | ICD-10-CM | POA: Diagnosis not present

## 2017-12-07 DIAGNOSIS — Z794 Long term (current) use of insulin: Secondary | ICD-10-CM | POA: Diagnosis not present

## 2017-12-07 DIAGNOSIS — J690 Pneumonitis due to inhalation of food and vomit: Secondary | ICD-10-CM | POA: Diagnosis not present

## 2017-12-07 DIAGNOSIS — K7210 Chronic hepatic failure without coma: Secondary | ICD-10-CM | POA: Diagnosis not present

## 2017-12-07 DIAGNOSIS — R0602 Shortness of breath: Secondary | ICD-10-CM | POA: Diagnosis not present

## 2017-12-07 DIAGNOSIS — B9689 Other specified bacterial agents as the cause of diseases classified elsewhere: Secondary | ICD-10-CM | POA: Diagnosis not present

## 2017-12-07 DIAGNOSIS — A419 Sepsis, unspecified organism: Secondary | ICD-10-CM | POA: Diagnosis not present

## 2017-12-07 DIAGNOSIS — J181 Lobar pneumonia, unspecified organism: Secondary | ICD-10-CM | POA: Diagnosis not present

## 2017-12-07 DIAGNOSIS — E1165 Type 2 diabetes mellitus with hyperglycemia: Secondary | ICD-10-CM | POA: Diagnosis not present

## 2017-12-07 DIAGNOSIS — K7290 Hepatic failure, unspecified without coma: Secondary | ICD-10-CM | POA: Diagnosis not present

## 2017-12-07 DIAGNOSIS — K219 Gastro-esophageal reflux disease without esophagitis: Secondary | ICD-10-CM | POA: Diagnosis not present

## 2017-12-07 DIAGNOSIS — K7460 Unspecified cirrhosis of liver: Secondary | ICD-10-CM | POA: Diagnosis not present

## 2017-12-07 DIAGNOSIS — J9601 Acute respiratory failure with hypoxia: Secondary | ICD-10-CM | POA: Diagnosis not present

## 2017-12-07 DIAGNOSIS — I491 Atrial premature depolarization: Secondary | ICD-10-CM | POA: Diagnosis not present

## 2017-12-07 DIAGNOSIS — I10 Essential (primary) hypertension: Secondary | ICD-10-CM | POA: Diagnosis not present

## 2017-12-07 DIAGNOSIS — R1311 Dysphagia, oral phase: Secondary | ICD-10-CM | POA: Diagnosis not present

## 2017-12-07 DIAGNOSIS — Z87891 Personal history of nicotine dependence: Secondary | ICD-10-CM | POA: Diagnosis not present

## 2017-12-07 DIAGNOSIS — J9602 Acute respiratory failure with hypercapnia: Secondary | ICD-10-CM | POA: Diagnosis not present

## 2017-12-07 DIAGNOSIS — Z66 Do not resuscitate: Secondary | ICD-10-CM | POA: Diagnosis not present

## 2017-12-07 DIAGNOSIS — R000 Tachycardia, unspecified: Secondary | ICD-10-CM | POA: Diagnosis not present

## 2017-12-07 DIAGNOSIS — Z8701 Personal history of pneumonia (recurrent): Secondary | ICD-10-CM | POA: Diagnosis not present

## 2017-12-07 DIAGNOSIS — Z9981 Dependence on supplemental oxygen: Secondary | ICD-10-CM | POA: Diagnosis not present

## 2017-12-07 DIAGNOSIS — J9622 Acute and chronic respiratory failure with hypercapnia: Secondary | ICD-10-CM | POA: Diagnosis not present

## 2017-12-07 DIAGNOSIS — J90 Pleural effusion, not elsewhere classified: Secondary | ICD-10-CM | POA: Diagnosis not present

## 2017-12-07 DIAGNOSIS — E8809 Other disorders of plasma-protein metabolism, not elsewhere classified: Secondary | ICD-10-CM | POA: Diagnosis not present

## 2017-12-07 DIAGNOSIS — N3090 Cystitis, unspecified without hematuria: Secondary | ICD-10-CM | POA: Diagnosis not present

## 2017-12-08 ENCOUNTER — Non-Acute Institutional Stay (SKILLED_NURSING_FACILITY): Payer: PPO | Admitting: Internal Medicine

## 2017-12-08 DIAGNOSIS — B961 Klebsiella pneumoniae [K. pneumoniae] as the cause of diseases classified elsewhere: Secondary | ICD-10-CM

## 2017-12-08 DIAGNOSIS — J690 Pneumonitis due to inhalation of food and vomit: Secondary | ICD-10-CM

## 2017-12-08 DIAGNOSIS — N3090 Cystitis, unspecified without hematuria: Secondary | ICD-10-CM | POA: Diagnosis not present

## 2017-12-08 DIAGNOSIS — K7460 Unspecified cirrhosis of liver: Secondary | ICD-10-CM | POA: Diagnosis not present

## 2017-12-08 DIAGNOSIS — J441 Chronic obstructive pulmonary disease with (acute) exacerbation: Secondary | ICD-10-CM

## 2017-12-08 DIAGNOSIS — F32A Depression, unspecified: Secondary | ICD-10-CM

## 2017-12-08 DIAGNOSIS — B9689 Other specified bacterial agents as the cause of diseases classified elsewhere: Secondary | ICD-10-CM

## 2017-12-08 DIAGNOSIS — F329 Major depressive disorder, single episode, unspecified: Secondary | ICD-10-CM

## 2017-12-08 DIAGNOSIS — K7581 Nonalcoholic steatohepatitis (NASH): Secondary | ICD-10-CM

## 2017-12-08 DIAGNOSIS — R600 Localized edema: Secondary | ICD-10-CM | POA: Diagnosis not present

## 2017-12-08 DIAGNOSIS — K7290 Hepatic failure, unspecified without coma: Secondary | ICD-10-CM | POA: Diagnosis not present

## 2017-12-08 DIAGNOSIS — K7682 Hepatic encephalopathy: Secondary | ICD-10-CM

## 2017-12-09 MED ORDER — GENERIC EXTERNAL MEDICATION
1.00 | Status: DC
Start: 2017-12-07 — End: 2017-12-09

## 2017-12-09 MED ORDER — RIFAXIMIN 550 MG PO TABS
550.00 | ORAL_TABLET | ORAL | Status: DC
Start: 2017-12-07 — End: 2017-12-09

## 2017-12-09 MED ORDER — ESCITALOPRAM OXALATE 10 MG PO TABS
5.00 | ORAL_TABLET | ORAL | Status: DC
Start: 2017-12-08 — End: 2017-12-09

## 2017-12-09 MED ORDER — GLUCOSE 40 % PO GEL
15.00 g | ORAL | Status: DC
Start: ? — End: 2017-12-09

## 2017-12-09 MED ORDER — MAGNESIUM OXIDE 400 MG PO TABS
400.00 | ORAL_TABLET | ORAL | Status: DC
Start: 2017-12-07 — End: 2017-12-09

## 2017-12-09 MED ORDER — BISACODYL 10 MG RE SUPP
10.00 | RECTAL | Status: DC
Start: ? — End: 2017-12-09

## 2017-12-09 MED ORDER — ALBUTEROL SULFATE 1.25 MG/3ML IN NEBU
2.50 | INHALATION_SOLUTION | RESPIRATORY_TRACT | Status: DC
Start: ? — End: 2017-12-09

## 2017-12-09 MED ORDER — GENERIC EXTERNAL MEDICATION
1.00 | Status: DC
Start: ? — End: 2017-12-09

## 2017-12-09 MED ORDER — ACETAMINOPHEN 325 MG PO TABS
650.00 | ORAL_TABLET | ORAL | Status: DC
Start: ? — End: 2017-12-09

## 2017-12-09 MED ORDER — IPRATROPIUM BROMIDE 0.02 % IN SOLN
0.50 | RESPIRATORY_TRACT | Status: DC
Start: 2017-12-07 — End: 2017-12-09

## 2017-12-09 MED ORDER — MAGNESIUM HYDROXIDE 400 MG/5ML PO SUSP
30.00 | ORAL | Status: DC
Start: ? — End: 2017-12-09

## 2017-12-09 MED ORDER — AMOXICILLIN-POT CLAVULANATE 875-125 MG PO TABS
875.00 | ORAL_TABLET | ORAL | Status: DC
Start: 2017-12-07 — End: 2017-12-09

## 2017-12-09 MED ORDER — LACTULOSE 10 GM/15ML PO SOLN
20.00 g | ORAL | Status: DC
Start: 2017-12-07 — End: 2017-12-09

## 2017-12-09 MED ORDER — PREDNISONE 20 MG PO TABS
20.00 | ORAL_TABLET | ORAL | Status: DC
Start: 2017-12-08 — End: 2017-12-09

## 2017-12-09 MED ORDER — DEXTROSE 50 % IV SOLN
12.00 g | INTRAVENOUS | Status: DC
Start: ? — End: 2017-12-09

## 2017-12-09 MED ORDER — GENERIC EXTERNAL MEDICATION
15.00 | Status: DC
Start: 2017-12-07 — End: 2017-12-09

## 2017-12-09 MED ORDER — ONDANSETRON HCL 4 MG/2ML IJ SOLN
4.00 | INTRAMUSCULAR | Status: DC
Start: ? — End: 2017-12-09

## 2017-12-09 MED ORDER — PANTOPRAZOLE SODIUM 40 MG PO TBEC
20.00 | DELAYED_RELEASE_TABLET | ORAL | Status: DC
Start: 2017-12-08 — End: 2017-12-09

## 2017-12-09 MED ORDER — ALBUTEROL SULFATE (5 MG/ML) 0.5% IN NEBU
2.50 | INHALATION_SOLUTION | RESPIRATORY_TRACT | Status: DC
Start: 2017-12-07 — End: 2017-12-09

## 2017-12-09 MED ORDER — SPIRONOLACTONE 25 MG PO TABS
50.00 | ORAL_TABLET | ORAL | Status: DC
Start: 2017-12-08 — End: 2017-12-09

## 2017-12-09 MED ORDER — FUROSEMIDE 40 MG PO TABS
40.00 | ORAL_TABLET | ORAL | Status: DC
Start: 2017-12-08 — End: 2017-12-09

## 2017-12-09 MED ORDER — HEPARIN SODIUM (PORCINE) 5000 UNIT/ML IJ SOLN
5000.00 | INTRAMUSCULAR | Status: DC
Start: 2017-12-07 — End: 2017-12-09

## 2017-12-09 MED ORDER — FLUTICASONE PROPIONATE 50 MCG/ACT NA SUSP
1.00 | NASAL | Status: DC
Start: 2017-12-08 — End: 2017-12-09

## 2017-12-09 MED ORDER — GENERIC EXTERNAL MEDICATION
Status: DC
Start: 2017-12-07 — End: 2017-12-09

## 2017-12-09 MED ORDER — INSULIN LISPRO 100 UNIT/ML ~~LOC~~ SOLN
2.00 | SUBCUTANEOUS | Status: DC
Start: 2017-12-07 — End: 2017-12-09

## 2017-12-11 ENCOUNTER — Encounter: Payer: Self-pay | Admitting: Internal Medicine

## 2017-12-11 DIAGNOSIS — J690 Pneumonitis due to inhalation of food and vomit: Secondary | ICD-10-CM | POA: Insufficient documentation

## 2017-12-11 DIAGNOSIS — F32A Depression, unspecified: Secondary | ICD-10-CM | POA: Insufficient documentation

## 2017-12-11 DIAGNOSIS — B9689 Other specified bacterial agents as the cause of diseases classified elsewhere: Secondary | ICD-10-CM | POA: Insufficient documentation

## 2017-12-11 DIAGNOSIS — F329 Major depressive disorder, single episode, unspecified: Secondary | ICD-10-CM | POA: Insufficient documentation

## 2017-12-11 DIAGNOSIS — N3090 Cystitis, unspecified without hematuria: Secondary | ICD-10-CM

## 2017-12-11 NOTE — Progress Notes (Signed)
: Provider:  Hennie Duos MD Location:  [facility]   Place of Service:  SNF ([phone])  PCP: Hennie Duos, MD Patient Care Team: Hennie Duos, MD as PCP - General (Internal Medicine) Dene Gentry, MD as Consulting Physician (Sports Medicine) Mauri Pole, MD as Consulting Physician (Gastroenterology) Rigoberto Noel, MD as Consulting Physician (Pulmonary Disease) Parrett, Fonnie Mu, NP as Nurse Practitioner (Pulmonary Disease) Volanda Napoleon, MD as Consulting Physician (Oncology)  Extended Emergency Contact Information Primary Emergency Contact: Lowella [place], [place] Phone: [phone] Mobile Phone: [phone] Relation: Friend Secondary Emergency Contact: Kathryne Hitch Address: [address]          [place], [place] [address] Johnnette [place] of [place] Phone: [phone] Mobile Phone: [phone] Relation: Friend     Allergies: Citalopram; Ciprofloxacin; Erythromycin; Glimepiride; and Prednisone  Chief Complaint  Patient presents with  . Readmit To SNF    HPI: Patient is 77 y.o. female with liver cirrhosis, COPD, diabetes mellitus type 2, chronic thrombocytopenia, chronic respiratory failure, and peripheral edema who presented to [facility] with right-sided pleuritic chest pain and shortness of breath onset the night prior.  Patient was found to have a right middle and lower lobe infiltrate with COPD exacerbation.  Patient was treated with empiric IV antibiotics, steroids, and bronchodilators.  She was evaluated by speech therapy for dysphasia and a ground diet was recommended.  1 out of 2 blood cultures was positive for coag negative staph but this was thought to be contaminant.  Hospital course was further complicated by UTI and a urine culture revealed Klebsiella.  She is completed antibiotics and will not be discharged on any antibiotics.  She is on lactulose and rifaximin for  history of hepatic encephalopathy and the dose of lactulose was decreased because of profuse diarrhea.  Patient is readmitted back to skilled nursing facility for OT/PT.  While at skilled nursing facility patient will be followed for liver cirrhosis with encephalopathy treated with rifaximin and lactulose, depression treated with Lexapro and lower extremity edema treated with Lasix and Spironolactone.  Past Medical History:  Diagnosis Date  . Abdominal aortic aneurysm ([place]) 10/01/2013   Fall of 2014 3.3 per patient, follows with Vascular surgeon.   . Acute respiratory failure with hypoxia ([place]) 07/31/2015  . Allergic state 11/10/2016  . Anxiety   . Anxiety and depression 02/01/2014  . Arthritis of both knees 10/01/2013  . Arthritis of right knee 10/01/2013   Follows with Dr Mayer Camel   . Benign paroxysmal positional vertigo 10/01/2013  . Breast cancer ([place])    No disease activity On Femara Follows with Dr Marin Olp Right mastectomy performed by Dr Autumn Messing   . Cancer [facility]) breast ca  right  . Chronic respiratory failure ([place]) 08/29/2015  . Cirrhosis of liver without ascites ([place]) 07/31/2015  . COPD (chronic obstructive pulmonary disease) ([place]) 10/01/2013  . COPD with acute exacerbation ([place]) 05/05/2017  . Depression   . Dermatitis 03/30/2017  . Diabetes mellitus type 2  . Diabetes mellitus type 2, controlled ([place]) 10/16/2014  . Emphysema   . Encephalopathy, hepatic ([place]) 06/07/2014  . Esophageal reflux 10/01/2013  . Fall 07/30/2016  . Hyperlipidemia   . Hyperlipidemia, mixed   . Increased ammonia level 11/25/2014  . NASH (nonalcoholic steatohepatitis) 08/26/2015  . Neck pain 10/01/2013  . Neuropathy    feet   . Osteopenia 03/30/2017  .  Overactive bladder 12/10/2013  . Panic attacks   . Pedal edema 12/10/2013  . Personal history of radiation therapy   . Preventative health care 03/08/2016  . Thrombocytopenia ([place]) 03/17/2012  . Tobacco abuse disorder 02/01/2014  . Type 2 diabetes mellitus with hyperglycemia, with  long-term current use of insulin ([place])   . Urine frequency 04/13/2017    Past Surgical History:  Procedure Laterality Date  . APPENDECTOMY  2007  . BREAST SURGERY  2009 right  . CATARACT EXTRACTION     x 2  . ESOPHAGOGASTRODUODENOSCOPY (EGD) WITH PROPOFOL N/A 08/14/2016   Procedure: ESOPHAGOGASTRODUODENOSCOPY (EGD) WITH PROPOFOL;  Surgeon: Mauri Pole, MD;  Location: [facility];  Service: Endoscopy;  Laterality: N/A;  . [place]  . KNEE SURGERY    . MANDIBLE FRACTURE SURGERY    . MASTECTOMY    . PILONIDAL CYST EXCISION    . TONSILLECTOMY      Allergies as of 12/08/2017      Reactions   Citalopram Palpitations   Irregular heart beat   Ciprofloxacin Other (See Comments)   Mental status change   Erythromycin Other (See Comments)   Stomach cramps   Glimepiride Other (See Comments)   Elevated ammonia levels   Prednisone    Increased blood sugars too high      Medication List        Accurate as of 12/08/17 11:59 PM. Always use your most recent med list.          acetaminophen 325 MG tablet Commonly known as:  TYLENOL Take 650 mg by mouth every 6 (six) hours as needed (pain).   albuterol 0.63 MG/3ML nebulizer solution Commonly known as:  ACCUNEB Take 1 ampule by nebulization every 6 (six) hours as needed for shortness of breath.   BIOFREEZE 4 % Gel Generic drug:  Menthol (Topical Analgesic) Apply 1 application topically 2 (two) times daily. For arthritic knee pain   bisacodyl 10 MG suppository Commonly known as:  DULCOLAX Place 10 mg rectally daily as needed (constipation not relieved by MOM).   escitalopram 5 MG tablet Commonly known as:  LEXAPRO Take 5 mg by mouth daily.   FLEET ENEMA RE Place 1 each rectally daily as needed (constipation not relieved by MOM or bisacodyl).   FLONASE ALLERGY RELIEF 50 MCG/ACT nasal spray Generic drug:  fluticasone Place 1 spray into both nostrils daily.   furosemide 80 MG tablet Commonly known as:   LASIX Take 80 mg by mouth daily.   insulin aspart 100 UNIT/ML FlexPen Commonly known as:  NOVOLOG Inject 0-15 Units into the skin 3 (three) times daily with meals. Inject 0-15 units subcutaneously three times daily before meals and at bedtime per sliding scale: CBG 70-120 0 units, 251-150 2 units, 151-200 3 units, 201-250 5 units, 251-300 8 units, 301-350 11 units, 351-400 15 units, >400 call MD and give 15 units   insulin glargine 100 UNIT/ML injection Commonly known as:  LANTUS Inject 0.1 mLs (10 Units total) into the skin at bedtime.   lactulose 10 GM/15ML solution Commonly known as:  CHRONULAC Take 45 g by mouth 4 (four) times daily.   magnesium oxide 400 MG tablet Commonly known as:  MAG-OX Take 400 mg by mouth 2 (two) times daily.   mometasone-formoterol 100-5 MCG/ACT Aero Commonly known as:  DULERA Inhale 2 puffs into the lungs 2 (two) times daily.   OXYGEN Inhale 2 L into the lungs continuous.   pantoprazole 20 MG tablet Commonly known  as:  PROTONIX Take 20 mg by mouth daily.   REFRESH OP Apply 1 drop to eye 2 (two) times daily.   rifaximin 550 MG Tabs tablet Commonly known as:  XIFAXAN Take 550 mg by mouth 2 (two) times daily.   sodium chloride 0.65 % Soln nasal spray Commonly known as:  OCEAN Place 2 sprays into both nostrils 2 (two) times daily as needed for congestion.   spironolactone 25 MG tablet Commonly known as:  ALDACTONE Take 25 mg by mouth daily.       No orders of the defined types were placed in this encounter.   Immunization History  Administered Date(s) Administered  . Influenza Split 04/09/2011, 03/27/2012, 03/27/2013  . Influenza, High Dose Seasonal PF 03/30/2017  . Influenza,inj,Quad PF,6+ Mos 03/26/2015  . Influenza-Unspecified 04/24/2014, 04/23/2016, 03/30/2017  . Pneumococcal Conjugate-13 02/01/2014  . Pneumococcal Polysaccharide-23 04/09/2011  . Td 07/27/2010  . Tdap 05/08/2014    Social History   Tobacco Use  . Smoking  status: Former Smoker    Packs/day: 0.25    Years: 58.00    Pack years: 14.50    Types: Cigarettes    Start date: 07/27/1964  . Smokeless tobacco: Never Used  Substance Use Topics  . Alcohol use: No    Alcohol/week: 0.0 oz    Family history is   Family History  Problem Relation Age of Onset  . Heart failure Father   . COPD Father   . Arthritis Father 22  . Stroke Mother   . Arthritis Mother 76  . Hyperlipidemia Mother   . Hypertension Mother   . Diabetes Mother   . Diabetes Sister   . Breast cancer Unknown   . Breast cancer Maternal Aunt   . Asthma Maternal Aunt   . Birth defects Maternal Aunt   . Alcohol abuse Maternal Uncle   . Breast cancer Maternal Aunt   . Stomach cancer Neg Hx   . Colon cancer Neg Hx       Review of Systems  DATA OBTAINED: from patient GENERAL:  no fevers, fatigue, appetite changes SKIN: No itching, or rash EYES: No eye pain, redness, discharge EARS: No earache, tinnitus, change in hearing NOSE: No congestion, drainage or bleeding  MOUTH/THROAT: No mouth or tooth pain, No sore throat RESPIRATORY: No cough, wheezing, SOB CARDIAC: No chest pain, palpitations, lower extremity edema  GI: No abdominal pain, No N/V/D or constipation, No heartburn or reflux  GU: No dysuria, frequency or urgency, or incontinence  MUSCULOSKELETAL: No unrelieved bone/joint pain NEUROLOGIC: No headache, dizziness or focal weakness PSYCHIATRIC: No c/o anxiety or sadness   Vitals:   12/11/17 1924  BP: (!) 128/59  Pulse: (!) 106  Resp: 18  Temp: 98.2 F (36.8 C)  SpO2: 92%    SpO2 Readings from Last 1 Encounters:  12/11/17 92%   Body mass index is 28.42 kg/m.     Physical Exam  GENERAL APPEARANCE: Alert, conversant,  No acute distress.  SKIN: No diaphoresis rash HEAD: Normocephalic, atraumatic  EYES: Conjunctiva/lids clear. Pupils round, reactive. EOMs intact.  EARS: External exam WNL, canals clear. Hearing grossly normal.  NOSE: No deformity or  discharge.  MOUTH/THROAT: Lips w/o lesions  RESPIRATORY: Breathing is even, unlabored. Lung sounds are decreased on right, minimal if any wheezing CARDIOVASCULAR: Heart RRR no murmurs, rubs or gallops. 2+ peripheral edema.   GASTROINTESTINAL: Abdomen is soft, non-tender, not distended w/ normal bowel sounds. GENITOURINARY: Bladder non tender, not distended  MUSCULOSKELETAL: No abnormal joints or musculature  NEUROLOGIC:  Cranial nerves 2-12 grossly intact. Moves all extremities  PSYCHIATRIC: Mood and affect appropriate to situation, no behavioral issues  Patient Active Problem List   Diagnosis Date Noted  . Hypomagnesemia 11/06/2017  . Hepatic encephalopathy ([place]) 10/30/2017  . Abnormal urinalysis 10/30/2017  . Right upper lobe pneumonia ([place]) 10/30/2017  . Acute respiratory failure with hypoxia and hypercapnia ([place]) 10/26/2017  . Acute pulmonary edema ([place]) 10/26/2017  . Acute metabolic encephalopathy 01/17/7627  . Liver cirrhosis secondary to NASH ([place]) 10/26/2017  . Sepsis ([place])   . Acute on chronic respiratory failure with hypoxia ([place]) 10/18/2017  . Bilateral lower extremity edema 10/02/2017  . Arthritis 10/02/2017  . COPD exacerbation ([place]) 06/25/2017  . Ventricular tachycardia ([place]) 05/22/2017  . Acute kidney injury ([place]) 05/17/2017  . Hyperkalemia 05/17/2017  . Vitamin D deficiency 05/17/2017  . COPD with acute exacerbation ([place]) 05/05/2017  . Urine frequency 04/13/2017  . Osteopenia 03/30/2017  . Dermatitis 03/30/2017  . Allergic state 11/10/2016  . 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 Portal hypertensive gastropathy ([place])   . Lower back injury, initial encounter 08/05/2016  . Fall 07/30/2016  . Preventative health care 03/08/2016  . Muscle spasm 02/25/2016  . Chronic respiratory failure ([place]) 08/29/2015  . Type 2 diabetes mellitus with hyperglycemia, with long-term current use of insulin ([place])   . Cirrhosis of liver without ascites ([place]) 07/31/2015  . Acute  respiratory failure with hypoxia ([place]) 07/31/2015  . Diarrhea 12/30/2014  . Encephalopathy, hepatic ([place]) 06/07/2014  . Right knee pain 06/07/2014  . Anxiety and depression 02/01/2014  . Tobacco abuse 02/01/2014  . Medicare annual wellness visit, subsequent 02/01/2014  . Overactive bladder 12/10/2013  . Abdominal aortic aneurysm ([place]) 10/01/2013  . Arthritis of right knee 10/01/2013  . Benign paroxysmal positional vertigo 10/01/2013  . Neck pain 10/01/2013  . Esophageal reflux 10/01/2013  . Thrombocytopenia ([place]) 03/17/2012  . 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   . Hyperlipidemia, mixed   . Breast cancer [facility])       Labs reviewed: Basic Metabolic Panel:    Component Value Date/Time   NA 139 11/04/2017 0445   NA 141 10/27/2017   NA 140 10/15/2017   NA 141 07/07/2017 1102   NA 139 06/29/2016 1128   K 4.0 11/04/2017 0445   K 4.8 10/15/2017   K 5.0 06/29/2016 1128   CL 105 11/04/2017 0445   CL 99 07/07/2017 1102   CO2 26 11/04/2017 0445   CO2 30 07/07/2017 1102   CO2 24 06/29/2016 1128   GLUCOSE 188 (H) 11/04/2017 0445   GLUCOSE 178 (H) 07/07/2017 1102   BUN 16 11/04/2017 0445   BUN 21 10/27/2017   BUN 22 07/07/2017 1102   BUN 15.9 06/29/2016 1128   CREATININE 0.84 11/04/2017 0445   CREATININE 0.89 10/15/2017   CREATININE 1.1 06/29/2016 1128   CALCIUM 8.4 (L) 11/04/2017 0445   CALCIUM 8.5 10/15/2017   CALCIUM 9.0 06/29/2016 1128   PROT 4.9 (L) 10/31/2017 1038   PROT 5.1 10/15/2017   PROT 5.3 (L) 07/07/2017 1102   PROT 5.9 (L) 06/29/2016 1128   ALBUMIN 1.9 (L) 10/31/2017 1038   ALBUMIN 2.2 10/15/2017   ALBUMIN 2.7 (L) 06/29/2016 1128   AST 45 (H) 10/31/2017 1038   AST 28 10/15/2017   AST 36 (H) 06/29/2016 1128   ALT 38 10/31/2017 1038   ALT 19 10/15/2017   ALT 44 07/07/2017 1102   ALT 24 06/29/2016 1128  ALKPHOS 127 (H) 10/31/2017 1038   ALKPHOS 191 10/15/2017   ALKPHOS 129 06/29/2016 1128   BILITOT 5.2 (H) 10/31/2017  1038   BILITOT 1.2 10/15/2017   BILITOT 1.78 (H) 06/29/2016 1128   GFRNONAA >60 11/04/2017 0445   GFRAA >60 11/04/2017 0445    Recent Labs    05/08/17 0421  10/19/17 0905 10/20/17 0857  11/02/17 0707 11/03/17 0412 11/04/17 0445  NA 137   < > 138 133*   < > 143 138 139  K 4.2   < > 4.2 4.3   < > 3.9 4.4 4.0  CL 103   < > 104 99*   < > 112* 106 105  CO2 25   < > 26 23   < > 27 22 26   GLUCOSE 185*   < > 210* 305*   < > 175* 196* 188*  BUN 46*   < > 50* 59*   < > 20 16 16   CREATININE 1.03*   < > 1.34* 1.51*   < > 0.89 0.77 0.84  CALCIUM 8.4*   < > 8.6* 9.0   < > 8.4* 8.5* 8.4*  MG 2.0  --  2.4 2.3  --   --   --   --   PHOS  --   --  3.7 3.0  --   --   --   --    < > = values in this interval not displayed.   Liver Function Tests: Recent Labs    10/15/17 10/18/17 0259 10/31/17 1038  AST 28 40 45*  ALT 19 22 38  ALKPHOS 191 187* 127*  BILITOT 1.2 3.8* 5.2*  PROT 5.1 5.7* 4.9*  ALBUMIN 2.2 2.3* 1.9*   No results for input(s): LIPASE, AMYLASE in the last 8760 hours. Recent Labs    11/02/17 0707 11/03/17 0412 11/04/17 0445  AMMONIA 41* 71* 23   CBC: Recent Labs    11/01/17 0327 11/02/17 0707 11/03/17 0412  WBC 9.4 8.9 8.9  NEUTROABS 6.8 6.0 6.5  HGB 10.6* 9.4* 10.3*  HCT 32.9* 29.7* 32.1*  MCV 94.8 95.8 95.0  PLT 105* 82* 85*   Lipid Recent Labs    02/09/17 1222  CHOL 160  HDL 48.40  LDLCALC 96  TRIG 78.0    Cardiac Enzymes: Recent Labs    05/05/17 1853  TROPONINI <0.03   BNP: No results for input(s): BNP in the last 8760 hours. Lab Results  Component Value Date   MICROALBUR <0.7 02/25/2016   Lab Results  Component Value Date   HGBA1C 6.7 (H) 10/30/2017   Lab Results  Component Value Date   TSH 1.16 11/10/2016   Lab Results  Component Value Date   VITAMINB12 644 10/16/2014   Lab Results  Component Value Date   FOLATE >20.0 10/16/2014   Lab Results  Component Value Date   IRON 54 07/31/2015   TIBC 344 07/31/2015   FERRITIN 16  07/31/2015    Imaging and Procedures obtained prior to SNF admission: Ct Head Wo Contrast  Result Date: 10/30/2017 CLINICAL DATA:  Acute presentation with altered mental status. Appended. Garbled speech. EXAM: CT HEAD WITHOUT CONTRAST TECHNIQUE: Contiguous axial images were obtained from the base of the skull through the vertex without intravenous contrast. COMPARISON:  08/31/2017.  08/12/2017.  11/25/2016. FINDINGS: Brain: Chronic atrophy and ventriculomegaly, stable over time. No sign of acute infarction, mass lesion, hemorrhage, hydrocephalus or extra-axial collection. Study does suffer from motion degradation. Vascular: There is atherosclerotic calcification  of the major vessels at the base of the brain. Skull: Negative Sinuses/Orbits: Clear/normal Other: None IMPRESSION: No acute finding by CT.  Atrophy and chronic ventriculomegaly. Electronically Signed   By: Nelson Chimes M.D.   On: 10/30/2017 14:51   Dg Chest Portable 1 View  Result Date: 10/30/2017 CLINICAL DATA:  77 year old female with altered mental status EXAM: PORTABLE CHEST 1 VIEW COMPARISON:  Prior chest x-ray 10/20/2017 FINDINGS: Focal patchy airspace opacification in the periphery of the right upper lobe is new compared to prior and concerning for bronchopneumonia. Stable mild cardiomegaly. Atherosclerotic calcifications are again present in the thoracic aorta. Diffuse bronchitic changes and interstitial prominence are stable. No pneumothorax or pleural effusion. No acute osseous abnormality. IMPRESSION: Suspect right upper lobe bronchopneumonia. Electronically Signed   By: Jacqulynn Cadet M.D.   On: 10/30/2017 13:12     Not all labs, radiology exams or other studies done during hospitalization come through on my EPIC note; however they are reviewed by me.    Assessment and Plan  Aspiration pneumonia/COPD exacerbation- right middle lobe and lower lobe with pleuritic chest pain and shortness of breath; patient was treated with IV  antibiotics and has completed the course SNF -readmitted back for OT/PT; continue on Nora Ellipta 62.5/25 1 puff daily and as needed albuterol  Klebsiella UTI- treated and completed course in hospital  Liver cirrhosis/history encephalopathy- patient not encephalopathic this hospitalization SNF -continue lactulose 30 mils 3 times daily and rifaximin 550 mg twice daily  Depression SNF -continue Lexapro 5 mg daily  Bilateral lower extremity edema SNF -continue Lasix 80 mg daily and Spironolactone 50 mg daily; will follow BMP   Greater than 35 minutes;> 50% of time with patient was spent reviewing records, labs, tests and studies, counseling and developing plan of care  Inocencio Homes, MD

## 2017-12-13 ENCOUNTER — Non-Acute Institutional Stay (SKILLED_NURSING_FACILITY): Payer: PPO | Admitting: Internal Medicine

## 2017-12-13 ENCOUNTER — Encounter: Payer: Self-pay | Admitting: Internal Medicine

## 2017-12-13 DIAGNOSIS — J441 Chronic obstructive pulmonary disease with (acute) exacerbation: Secondary | ICD-10-CM

## 2017-12-13 NOTE — Progress Notes (Addendum)
Location:  [facility] Room Number: 834-H Place of Service:  SNF [phone])  Ashley Duos, MD  Patient Care Team: Ashley Duos, MD as PCP - General (Internal Medicine) Dene Gentry, MD as Consulting Physician (Sports Medicine) Mauri Pole, MD as Consulting Physician (Gastroenterology) Rigoberto Noel, MD as Consulting Physician (Pulmonary Disease) Parrett, Fonnie Mu, NP as Nurse Practitioner (Pulmonary Disease) Volanda Napoleon, MD as Consulting Physician (Oncology)  Extended Emergency Contact Information Primary Emergency Contact: Lowella [place], [place] Phone: [phone] Mobile Phone: [phone] Relation: Friend Secondary Emergency Contact: Kathryne Hitch Address: [address]          [place], [place] [address] Johnnette Litter of [place] Phone: [phone] Mobile Phone: [phone] Relation: Friend    Allergies: Citalopram; Ciprofloxacin; Erythromycin; Glimepiride; and Prednisone  Chief Complaint  Patient presents with  . Acute Visit    Patient c/o right side chest pain and wheezing     HPI: Patient is 77 y.o. female who nursing asked me to see because patient is complaining of right-sided chest pain again and because she is in respiratory distress.  Onset this morning.  Patient has had no fever no colds coughs.  Patient had been recovering from her COPD exacerbation and pneumonia Problems.  On exam patient has tight wheezing with increased respiratory rate 24-26 and O2 saturation of 93% on oxygen.  When I have seen her she has had 2 DuoNeb's over the past several hours.  I ordered a third DuoNeb and after this DuoNeb patient has slightly increased breath sounds.  I have ordered an albuterol to follow this third DuoNeb  Past Medical History:  Diagnosis Date  . Abdominal aortic aneurysm ([place]) 10/01/2013   Fall of 2014 3.3 per patient, follows with Vascular surgeon.   . Acute  respiratory failure with hypoxia ([place]) 07/31/2015  . Allergic state 11/10/2016  . Anxiety   . Anxiety and depression 02/01/2014  . Arthritis of both knees 10/01/2013  . Arthritis of right knee 10/01/2013   Follows with Dr Mayer Camel   . Benign paroxysmal positional vertigo 10/01/2013  . Breast cancer ([place])    No disease activity On Femara Follows with Dr Marin Olp Right mastectomy performed by Dr Autumn Messing   . Cancer [facility]) breast ca  right  . Chronic respiratory failure ([place]) 08/29/2015  . Cirrhosis of liver without ascites ([place]) 07/31/2015  . COPD (chronic obstructive pulmonary disease) ([place]) 10/01/2013  . COPD with acute exacerbation ([place]) 05/05/2017  . Depression   . Dermatitis 03/30/2017  . Diabetes mellitus type 2  . Diabetes mellitus type 2, controlled ([place]) 10/16/2014  . Emphysema   . Encephalopathy, hepatic ([place]) 06/07/2014  . Esophageal reflux 10/01/2013  . Fall 07/30/2016  . Hyperlipidemia   . Hyperlipidemia, mixed   . Increased ammonia level 11/25/2014  . NASH (nonalcoholic steatohepatitis) 08/26/2015  . Neck pain 10/01/2013  . Neuropathy    feet   . Osteopenia 03/30/2017  . Overactive bladder 12/10/2013  . Panic attacks   . Pedal edema 12/10/2013  . Personal history of radiation therapy   . Preventative health care 03/08/2016  . Thrombocytopenia ([place]) 03/17/2012  . Tobacco abuse disorder 02/01/2014  . Type 2 diabetes mellitus with hyperglycemia, with long-term current use of insulin ([place])   . Urine frequency 04/13/2017    Past Surgical History:  Procedure Laterality Date  . APPENDECTOMY  2007  .  BREAST SURGERY  2009 right  . CATARACT EXTRACTION     x 2  . ESOPHAGOGASTRODUODENOSCOPY (EGD) WITH PROPOFOL N/A 08/14/2016   Procedure: ESOPHAGOGASTRODUODENOSCOPY (EGD) WITH PROPOFOL;  Surgeon: Mauri Pole, MD;  Location: [facility] ENDOSCOPY;  Service: Endoscopy;  Laterality: N/A;  . [place]  . KNEE SURGERY    . MANDIBLE FRACTURE SURGERY    . MASTECTOMY    . PILONIDAL CYST EXCISION    .  TONSILLECTOMY      Allergies as of 12/13/2017      Reactions   Citalopram Palpitations   Irregular heart beat   Ciprofloxacin Other (See Comments)   Mental status change   Erythromycin Other (See Comments)   Stomach cramps   Glimepiride Other (See Comments)   Elevated ammonia levels   Prednisone    Increased blood sugars too high      Medication List        Accurate as of 12/13/17  2:30 PM. Always use your most recent med list.          acetaminophen 325 MG tablet Commonly known as:  TYLENOL Take 650 mg by mouth every 6 (six) hours as needed (pain).   albuterol 108 (90 Base) MCG/ACT inhaler Commonly known as:  PROVENTIL HFA;VENTOLIN HFA Inhale 2 puffs into the lungs every 6 (six) hours as needed for wheezing or shortness of breath.   ANORO ELLIPTA 62.5-25 MCG/INH Aepb Generic drug:  umeclidinium-vilanterol Inhale 1 puff into the lungs daily.   BIOFREEZE 4 % Gel Generic drug:  Menthol (Topical Analgesic) Apply 1 application topically 2 (two) times daily. For arthritic knee pain   bisacodyl 10 MG suppository Commonly known as:  DULCOLAX Place 10 mg rectally daily as needed (constipation not relieved by MOM).   escitalopram 5 MG tablet Commonly known as:  LEXAPRO Take 5 mg by mouth daily.   FLEET ENEMA RE Place 1 each rectally daily as needed (constipation not relieved by MOM or bisacodyl).   furosemide 80 MG tablet Commonly known as:  LASIX Take 80 mg by mouth daily.   insulin aspart 100 UNIT/ML FlexPen Commonly known as:  NOVOLOG Inject 0-15 Units into the skin 3 (three) times daily with meals. Inject 0-15 units subcutaneously three times daily before meals and at bedtime per sliding scale: CBG 70-120 0 units, 251-150 2 units, 151-200 3 units, 201-250 5 units, 251-300 8 units, 301-350 11 units, 351-400 15 units, >400 call MD and give 15 units   insulin glargine 100 UNIT/ML injection Commonly known as:  LANTUS Inject 0.1 mLs (10 Units total) into the skin at  bedtime.   ipratropium-albuterol 0.5-2.5 (3) MG/3ML Soln Commonly known as:  DUONEB Take 3 mLs by nebulization every 6 (six) hours as needed.   lactulose 10 GM/15ML solution Commonly known as:  CHRONULAC Take 20 g by mouth 3 (three) times daily.   magnesium oxide 400 MG tablet Commonly known as:  MAG-OX Take 400 mg by mouth 2 (two) times daily.   OXYGEN Inhale 2 L into the lungs continuous.   REFRESH OP Apply 1 drop to eye 2 (two) times daily.   rifaximin 550 MG Tabs tablet Commonly known as:  XIFAXAN Take 550 mg by mouth 2 (two) times daily.   sodium chloride 0.65 % Soln nasal spray Commonly known as:  OCEAN Place 2 sprays into both nostrils 2 (two) times daily as needed for congestion.   spironolactone 25 MG tablet Commonly known as:  ALDACTONE Take 50 mg by  mouth daily.       No orders of the defined types were placed in this encounter.   Immunization History  Administered Date(s) Administered  . Influenza Split 04/09/2011, 03/27/2012, 03/27/2013  . Influenza, High Dose Seasonal PF 03/30/2017  . Influenza,inj,Quad PF,6+ Mos 03/26/2015  . Influenza-Unspecified 04/24/2014, 04/23/2016, 03/30/2017  . Pneumococcal Conjugate-13 02/01/2014  . Pneumococcal Polysaccharide-23 04/09/2011  . Td 07/27/2010  . Tdap 05/08/2014    Social History   Tobacco Use  . Smoking status: Former Smoker    Packs/day: 0.25    Years: 58.00    Pack years: 14.50    Types: Cigarettes    Start date: 07/27/1964  . Smokeless tobacco: Never Used  Substance Use Topics  . Alcohol use: No    Alcohol/week: 0.0 oz    Review of Systems  DATA OBTAINED: from patient, nurse as per history of present illness GENERAL:  no fevers, fatigue, appetite changes SKIN: No itching, rash HEENT: No complaint RESPIRATORY: No cough, +wheezing,+ SOB CARDIAC: + chest pain, no palpitations, +lower extremity edema, baseline GI: No abdominal pain, No N/V/D or constipation, No heartburn or reflux  GU: No  dysuria, frequency or urgency, or incontinence  MUSCULOSKELETAL: No unrelieved bone/joint pain NEUROLOGIC: No headache, dizziness  PSYCHIATRIC: No overt anxiety or sadness  Vitals:   12/13/17 1354  BP: 114/62  Pulse: 84  Resp: 18  Temp: (!) 97.3 F (36.3 C)  SpO2: 93%   Body mass index is 30.95 kg/m. Physical Exam  GENERAL APPEARANCE: Alert, conversant,  distress  SKIN: No diaphoresis rash HEENT: Unremarkable RESPIRATORY: Breathing is even, labored. Lung sounds are tight wheezing with increased respiratory rate 24-26 CARDIOVASCULAR: Heart RRR 3/6 murmur, no  rubs or gallops. 2+ peripheral edema  GASTROINTESTINAL: Abdomen is soft, non-tender, not distended w/ normal bowel sounds.  GENITOURINARY: Bladder non tender, not distended  MUSCULOSKELETAL: No abnormal joints or musculature NEUROLOGIC: Cranial nerves 2-12 grossly intact. Moves all extremities PSYCHIATRIC: Mood and affect appropriate to situation, no behavioral issues  Patient Active Problem List   Diagnosis Date Noted  . Aspiration pneumonia ([place]) 12/11/2017  . Klebsiella cystitis 12/11/2017  . Depression 12/11/2017  . Hypomagnesemia 11/06/2017  . Hepatic encephalopathy ([place]) 10/30/2017  . Abnormal urinalysis 10/30/2017  . Right upper lobe pneumonia ([place]) 10/30/2017  . Acute respiratory failure with hypoxia and hypercapnia ([place]) 10/26/2017  . Acute pulmonary edema ([place]) 10/26/2017  . Acute metabolic encephalopathy 97/67/3419  . Liver cirrhosis secondary to NASH ([place]) 10/26/2017  . Sepsis ([place])   . Acute on chronic respiratory failure with hypoxia ([place]) 10/18/2017  . Bilateral lower extremity edema 10/02/2017  . Arthritis 10/02/2017  . COPD exacerbation ([place]) 06/25/2017  . Ventricular tachycardia ([place]) 05/22/2017  . Acute kidney injury ([place]) 05/17/2017  . Hyperkalemia 05/17/2017  . Vitamin D deficiency 05/17/2017  . COPD with acute exacerbation ([place]) 05/05/2017  . Urine frequency 04/13/2017  . Osteopenia  03/30/2017  . Dermatitis 03/30/2017  . Allergic state 11/10/2016  . 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 Portal hypertensive gastropathy ([place])   . Lower back injury, initial encounter 08/05/2016  . Fall 07/30/2016  . Preventative health care 03/08/2016  . Muscle spasm 02/25/2016  . Chronic respiratory failure ([place]) 08/29/2015  . Type 2 diabetes mellitus with hyperglycemia, with long-term current use of insulin ([place])   . Cirrhosis of liver without ascites ([place]) 07/31/2015  . Acute respiratory failure with hypoxia ([place]) 07/31/2015  . Diarrhea 12/30/2014  . Encephalopathy, hepatic ([place]) 06/07/2014  . Right knee  pain 06/07/2014  . Anxiety and depression 02/01/2014  . Tobacco abuse 02/01/2014  . Medicare annual wellness visit, subsequent 02/01/2014  . Overactive bladder 12/10/2013  . Abdominal aortic aneurysm ([place]) 10/01/2013  . Arthritis of right knee 10/01/2013  . Benign paroxysmal positional vertigo 10/01/2013  . Neck pain 10/01/2013  . Esophageal reflux 10/01/2013  . Thrombocytopenia ([place]) 03/17/2012  . 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   . Hyperlipidemia, mixed   . Breast cancer ([place])     CMP     Component Value Date/Time   NA 139 11/04/2017 0445   NA 141 10/27/2017   NA 140 10/15/2017   NA 141 07/07/2017 1102   NA 139 06/29/2016 1128   K 4.0 11/04/2017 0445   K 4.8 10/15/2017   K 5.0 06/29/2016 1128   CL 105 11/04/2017 0445   CL 99 07/07/2017 1102   CO2 26 11/04/2017 0445   CO2 30 07/07/2017 1102   CO2 24 06/29/2016 1128   GLUCOSE 188 (H) 11/04/2017 0445   GLUCOSE 178 (H) 07/07/2017 1102   BUN 16 11/04/2017 0445   BUN 21 10/27/2017   BUN 22 07/07/2017 1102   BUN 15.9 06/29/2016 1128   CREATININE 0.84 11/04/2017 0445   CREATININE 0.89 10/15/2017   CREATININE 1.1 06/29/2016 1128   CALCIUM 8.4 (L) 11/04/2017 0445   CALCIUM 8.5 10/15/2017   CALCIUM 9.0 06/29/2016 1128   PROT 4.9 (L) 10/31/2017 1038   PROT 5.1  10/15/2017   PROT 5.3 (L) 07/07/2017 1102   PROT 5.9 (L) 06/29/2016 1128   ALBUMIN 1.9 (L) 10/31/2017 1038   ALBUMIN 2.2 10/15/2017   ALBUMIN 2.7 (L) 06/29/2016 1128   AST 45 (H) 10/31/2017 1038   AST 28 10/15/2017   AST 36 (H) 06/29/2016 1128   ALT 38 10/31/2017 1038   ALT 19 10/15/2017   ALT 44 07/07/2017 1102   ALT 24 06/29/2016 1128   ALKPHOS 127 (H) 10/31/2017 1038   ALKPHOS 191 10/15/2017   ALKPHOS 129 06/29/2016 1128   BILITOT 5.2 (H) 10/31/2017 1038   BILITOT 1.2 10/15/2017   BILITOT 1.78 (H) 06/29/2016 1128   GFRNONAA >60 11/04/2017 0445   GFRAA >60 11/04/2017 0445   Recent Labs    05/08/17 0421  10/19/17 0905 10/20/17 0857  11/02/17 0707 11/03/17 0412 11/04/17 0445  NA 137   < > 138 133*   < > 143 138 139  K 4.2   < > 4.2 4.3   < > 3.9 4.4 4.0  CL 103   < > 104 99*   < > 112* 106 105  CO2 25   < > 26 23   < > 27 22 26   GLUCOSE 185*   < > 210* 305*   < > 175* 196* 188*  BUN 46*   < > 50* 59*   < > 20 16 16   CREATININE 1.03*   < > 1.34* 1.51*   < > 0.89 0.77 0.84  CALCIUM 8.4*   < > 8.6* 9.0   < > 8.4* 8.5* 8.4*  MG 2.0  --  2.4 2.3  --   --   --   --   PHOS  --   --  3.7 3.0  --   --   --   --    < > = values in this interval not displayed.   Recent Labs    10/15/17 10/18/17 0259 10/31/17 1038  AST 28 40 45*  ALT 19 22 38  ALKPHOS 191 187* 127*  BILITOT 1.2 3.8* 5.2*  PROT 5.1 5.7* 4.9*  ALBUMIN 2.2 2.3* 1.9*   Recent Labs    11/01/17 0327 11/02/17 0707 11/03/17 0412  WBC 9.4 8.9 8.9  NEUTROABS 6.8 6.0 6.5  HGB 10.6* 9.4* 10.3*  HCT 32.9* 29.7* 32.1*  MCV 94.8 95.8 95.0  PLT 105* 82* 85*   Recent Labs    02/09/17 1222  CHOL 160  LDLCALC 96  TRIG 78.0   Lab Results  Component Value Date   MICROALBUR <0.7 02/25/2016   Lab Results  Component Value Date   TSH 1.16 11/10/2016   Lab Results  Component Value Date   HGBA1C 6.7 (H) 10/30/2017   Lab Results  Component Value Date   CHOL 160 02/09/2017   HDL 48.40 02/09/2017    LDLCALC 96 02/09/2017   TRIG 78.0 02/09/2017   CHOLHDL 3 02/09/2017    Significant Diagnostic Results in last 30 days:  No results found.  Assessment and Plan  Acute COPD exacerbation- probably recurrent COPD exacerbation since patient is off prednisone; patient had no chest pain when she came back from the hospital, now chest pain again; Lortab 5 mg given now also Solu-Medrol 125 mg IM and a fourth neb this time and albuterol neb; I have ordered albuterol nebs and duo nebs alternating every 4 hours for 2 days and a prednisone taper of 60/60/40/40/20/20/10 and Rocephin 1 g IM daily for 7 days.  If we managed to get patient's airway is open and is possible she may be able to stay in the skilled nursing facility, if were unable to get her airways open she will have to be sent to the hospital  2 hours later- after patient's albuterol neb which was her fourth neb this morning patient is resting sleeping, with good breath sounds; will continue to monitor    Time spent greater than 35 minutes Romen Yutzy D. Sheppard Coil, MD

## 2017-12-14 ENCOUNTER — Non-Acute Institutional Stay: Payer: Self-pay | Admitting: Internal Medicine

## 2017-12-14 ENCOUNTER — Encounter: Payer: Self-pay | Admitting: Internal Medicine

## 2017-12-14 ENCOUNTER — Non-Acute Institutional Stay (SKILLED_NURSING_FACILITY): Payer: PPO | Admitting: Internal Medicine

## 2017-12-14 DIAGNOSIS — Z515 Encounter for palliative care: Secondary | ICD-10-CM | POA: Diagnosis not present

## 2017-12-14 DIAGNOSIS — K7469 Other cirrhosis of liver: Secondary | ICD-10-CM | POA: Diagnosis not present

## 2017-12-14 DIAGNOSIS — A419 Sepsis, unspecified organism: Secondary | ICD-10-CM | POA: Diagnosis not present

## 2017-12-14 DIAGNOSIS — R1312 Dysphagia, oropharyngeal phase: Secondary | ICD-10-CM | POA: Diagnosis not present

## 2017-12-14 DIAGNOSIS — K7460 Unspecified cirrhosis of liver: Secondary | ICD-10-CM | POA: Diagnosis not present

## 2017-12-14 DIAGNOSIS — J441 Chronic obstructive pulmonary disease with (acute) exacerbation: Secondary | ICD-10-CM | POA: Diagnosis not present

## 2017-12-14 DIAGNOSIS — J189 Pneumonia, unspecified organism: Secondary | ICD-10-CM | POA: Diagnosis not present

## 2017-12-14 DIAGNOSIS — R0602 Shortness of breath: Secondary | ICD-10-CM | POA: Diagnosis not present

## 2017-12-14 DIAGNOSIS — J9602 Acute respiratory failure with hypercapnia: Secondary | ICD-10-CM

## 2017-12-14 DIAGNOSIS — K7210 Chronic hepatic failure without coma: Secondary | ICD-10-CM | POA: Diagnosis not present

## 2017-12-14 DIAGNOSIS — Z66 Do not resuscitate: Secondary | ICD-10-CM | POA: Diagnosis not present

## 2017-12-14 DIAGNOSIS — I10 Essential (primary) hypertension: Secondary | ICD-10-CM | POA: Diagnosis not present

## 2017-12-14 DIAGNOSIS — J9622 Acute and chronic respiratory failure with hypercapnia: Secondary | ICD-10-CM | POA: Diagnosis not present

## 2017-12-14 DIAGNOSIS — R0609 Other forms of dyspnea: Secondary | ICD-10-CM | POA: Diagnosis not present

## 2017-12-14 DIAGNOSIS — E8809 Other disorders of plasma-protein metabolism, not elsewhere classified: Secondary | ICD-10-CM | POA: Diagnosis not present

## 2017-12-14 DIAGNOSIS — J918 Pleural effusion in other conditions classified elsewhere: Secondary | ICD-10-CM | POA: Diagnosis not present

## 2017-12-14 DIAGNOSIS — E872 Acidosis: Secondary | ICD-10-CM | POA: Diagnosis not present

## 2017-12-14 DIAGNOSIS — J181 Lobar pneumonia, unspecified organism: Secondary | ICD-10-CM | POA: Diagnosis not present

## 2017-12-14 DIAGNOSIS — J690 Pneumonitis due to inhalation of food and vomit: Secondary | ICD-10-CM | POA: Diagnosis not present

## 2017-12-14 DIAGNOSIS — R069 Unspecified abnormalities of breathing: Secondary | ICD-10-CM | POA: Diagnosis not present

## 2017-12-14 DIAGNOSIS — J9601 Acute respiratory failure with hypoxia: Secondary | ICD-10-CM

## 2017-12-14 DIAGNOSIS — R000 Tachycardia, unspecified: Secondary | ICD-10-CM | POA: Diagnosis not present

## 2017-12-14 DIAGNOSIS — K219 Gastro-esophageal reflux disease without esophagitis: Secondary | ICD-10-CM | POA: Diagnosis not present

## 2017-12-14 DIAGNOSIS — J168 Pneumonia due to other specified infectious organisms: Secondary | ICD-10-CM | POA: Diagnosis not present

## 2017-12-14 DIAGNOSIS — N189 Chronic kidney disease, unspecified: Secondary | ICD-10-CM | POA: Diagnosis not present

## 2017-12-14 DIAGNOSIS — Z87891 Personal history of nicotine dependence: Secondary | ICD-10-CM | POA: Diagnosis not present

## 2017-12-14 DIAGNOSIS — I491 Atrial premature depolarization: Secondary | ICD-10-CM | POA: Diagnosis not present

## 2017-12-14 DIAGNOSIS — Z9981 Dependence on supplemental oxygen: Secondary | ICD-10-CM | POA: Diagnosis not present

## 2017-12-14 DIAGNOSIS — J984 Other disorders of lung: Secondary | ICD-10-CM | POA: Diagnosis not present

## 2017-12-14 DIAGNOSIS — T17590A Other foreign object in bronchus causing asphyxiation, initial encounter: Secondary | ICD-10-CM | POA: Diagnosis not present

## 2017-12-14 DIAGNOSIS — Z9011 Acquired absence of right breast and nipple: Secondary | ICD-10-CM | POA: Diagnosis not present

## 2017-12-14 DIAGNOSIS — R601 Generalized edema: Secondary | ICD-10-CM | POA: Diagnosis not present

## 2017-12-14 DIAGNOSIS — J449 Chronic obstructive pulmonary disease, unspecified: Secondary | ICD-10-CM | POA: Diagnosis not present

## 2017-12-14 DIAGNOSIS — Z8701 Personal history of pneumonia (recurrent): Secondary | ICD-10-CM | POA: Diagnosis not present

## 2017-12-14 DIAGNOSIS — E118 Type 2 diabetes mellitus with unspecified complications: Secondary | ICD-10-CM | POA: Diagnosis not present

## 2017-12-14 DIAGNOSIS — K766 Portal hypertension: Secondary | ICD-10-CM | POA: Diagnosis not present

## 2017-12-14 DIAGNOSIS — J90 Pleural effusion, not elsewhere classified: Secondary | ICD-10-CM | POA: Diagnosis not present

## 2017-12-14 DIAGNOSIS — Z794 Long term (current) use of insulin: Secondary | ICD-10-CM | POA: Diagnosis not present

## 2017-12-14 DIAGNOSIS — J9600 Acute respiratory failure, unspecified whether with hypoxia or hypercapnia: Secondary | ICD-10-CM | POA: Diagnosis not present

## 2017-12-14 DIAGNOSIS — J9621 Acute and chronic respiratory failure with hypoxia: Secondary | ICD-10-CM | POA: Diagnosis not present

## 2017-12-14 DIAGNOSIS — Z825 Family history of asthma and other chronic lower respiratory diseases: Secondary | ICD-10-CM | POA: Diagnosis not present

## 2017-12-14 DIAGNOSIS — N179 Acute kidney failure, unspecified: Secondary | ICD-10-CM | POA: Diagnosis not present

## 2017-12-14 DIAGNOSIS — K7581 Nonalcoholic steatohepatitis (NASH): Secondary | ICD-10-CM | POA: Diagnosis not present

## 2017-12-14 LAB — BASIC METABOLIC PANEL
BUN: 18 (ref 4–21)
CREATININE: 0.6 (ref 0.5–1.1)
Glucose: 130
Potassium: 4.3 (ref 3.4–5.3)
Sodium: 138 (ref 137–147)

## 2017-12-14 LAB — CBC AND DIFFERENTIAL
HCT: 29 — AB (ref 36–46)
Hemoglobin: 9.7 — AB (ref 12.0–16.0)
Platelets: 91 — AB (ref 150–399)
WBC: 8.3

## 2017-12-14 MED ORDER — INSULIN LISPRO 100 UNIT/ML ~~LOC~~ SOLN
2.00 | SUBCUTANEOUS | Status: DC
Start: 2017-12-27 — End: 2017-12-14

## 2017-12-14 MED ORDER — DEXTROSE 50 % IV SOLN
12.00 g | INTRAVENOUS | Status: DC
Start: ? — End: 2017-12-14

## 2017-12-14 MED ORDER — GLUCOSE 40 % PO GEL
15.00 g | ORAL | Status: DC
Start: ? — End: 2017-12-14

## 2017-12-14 MED ORDER — GENERIC EXTERNAL MEDICATION
1.00 | Status: DC
Start: ? — End: 2017-12-14

## 2017-12-14 MED ORDER — GENERIC EXTERNAL MEDICATION
3.38 g | Status: DC
Start: 2017-12-15 — End: 2017-12-14

## 2017-12-14 MED ORDER — GENERIC EXTERNAL MEDICATION
15.00 | Status: DC
Start: 2017-12-15 — End: 2017-12-14

## 2017-12-14 NOTE — Progress Notes (Addendum)
Location:  [facility] Room Number: 195-K Place of Service:  SNF [phone])  Hennie Duos, MD provider  Patient Care Team: Hennie Duos, MD as PCP - General (Internal Medicine) Dene Gentry, MD as Consulting Physician (Sports Medicine) Mauri Pole, MD as Consulting Physician (Gastroenterology) Rigoberto Noel, MD as Consulting Physician (Pulmonary Disease) Parrett, Fonnie Mu, NP as Nurse Practitioner (Pulmonary Disease) Volanda Napoleon, MD as Consulting Physician (Oncology)  Extended Emergency Contact Information Primary Emergency Contact: Lowella [place], [place] Phone: [phone] Mobile Phone: [phone] Relation: Friend Secondary Emergency Contact: Kathryne Hitch Address: [address]          [place], [place] [address] Johnnette Litter of [place] Phone: [phone] Mobile Phone: [phone] Relation: Friend    Allergies: Citalopram; Ciprofloxacin; Erythromycin; Glimepiride; and Prednisone  Chief Complaint  Patient presents with  . Follow-up    Follow Up Per Dr. Sheppard Coil    HPI: Patient is 77 y.o. female who is being seen this morning because she is taking a turn for the worse.  Patient was seen yesterday for an acute COPD exacerbation and had improved, however this morning patient has worsened again, she is extremely diaphoretic her respiratory rate is around 30 and her airways are moderately tight.  Patient denies cough, she is of course short of breath, nursing says no fever throughout the night.  Past Medical History:  Diagnosis Date  . Abdominal aortic aneurysm ([place]) 10/01/2013   Fall of 2014 3.3 per patient, follows with Vascular surgeon.   . Acute respiratory failure with hypoxia ([place]) 07/31/2015  . Allergic state 11/10/2016  . Anxiety   . Anxiety and depression 02/01/2014  . Arthritis of both knees 10/01/2013  . Arthritis of right knee 10/01/2013   Follows with Dr Mayer Camel   .  Benign paroxysmal positional vertigo 10/01/2013  . Breast cancer ([place])    No disease activity On Femara Follows with Dr Marin Olp Right mastectomy performed by Dr Autumn Messing   . Cancer [facility]) breast ca  right  . Chronic respiratory failure ([place]) 08/29/2015  . Cirrhosis of liver without ascites ([place]) 07/31/2015  . COPD (chronic obstructive pulmonary disease) ([place]) 10/01/2013  . COPD with acute exacerbation ([place]) 05/05/2017  . Depression   . Dermatitis 03/30/2017  . Diabetes mellitus type 2  . Diabetes mellitus type 2, controlled ([place]) 10/16/2014  . Emphysema   . Encephalopathy, hepatic ([place]) 06/07/2014  . Esophageal reflux 10/01/2013  . Fall 07/30/2016  . Hyperlipidemia   . Hyperlipidemia, mixed   . Increased ammonia level 11/25/2014  . NASH (nonalcoholic steatohepatitis) 08/26/2015  . Neck pain 10/01/2013  . Neuropathy    feet   . Osteopenia 03/30/2017  . Overactive bladder 12/10/2013  . Panic attacks   . Pedal edema 12/10/2013  . Personal history of radiation therapy   . Preventative health care 03/08/2016  . Thrombocytopenia ([place]) 03/17/2012  . Tobacco abuse disorder 02/01/2014  . Type 2 diabetes mellitus with hyperglycemia, with long-term current use of insulin ([place])   . Urine frequency 04/13/2017    Past Surgical History:  Procedure Laterality Date  . APPENDECTOMY  2007  . BREAST SURGERY  2009 right  . CATARACT EXTRACTION     x 2  . ESOPHAGOGASTRODUODENOSCOPY (EGD) WITH PROPOFOL N/A 08/14/2016   Procedure: ESOPHAGOGASTRODUODENOSCOPY (EGD) WITH PROPOFOL;  Surgeon: Mauri Pole, MD;  Location: [facility];  Service:  Endoscopy;  Laterality: N/A;  . [place]  . KNEE SURGERY    . MANDIBLE FRACTURE SURGERY    . MASTECTOMY    . PILONIDAL CYST EXCISION    . TONSILLECTOMY      Allergies as of 12/14/2017      Reactions   Citalopram Palpitations   Irregular heart beat   Ciprofloxacin Other (See Comments)   Mental status change   Erythromycin Other (See Comments)   Stomach  cramps   Glimepiride Other (See Comments)   Elevated ammonia levels   Prednisone    Increased blood sugars too high      Medication List        Accurate as of 12/14/17 11:59 PM. Always use your most recent med list.          acetaminophen 325 MG tablet Commonly known as:  TYLENOL Take 650 mg by mouth every 6 (six) hours as needed (pain).   albuterol 108 (90 Base) MCG/ACT inhaler Commonly known as:  PROVENTIL HFA;VENTOLIN HFA Inhale 2 puffs into the lungs every 6 (six) hours as needed for wheezing or shortness of breath.   ANORO ELLIPTA 62.5-25 MCG/INH Aepb Generic drug:  umeclidinium-vilanterol Inhale 1 puff into the lungs daily.   BIOFREEZE 4 % Gel Generic drug:  Menthol (Topical Analgesic) Apply 1 application topically 2 (two) times daily. For arthritic knee pain   bisacodyl 10 MG suppository Commonly known as:  DULCOLAX Place 10 mg rectally daily as needed (constipation not relieved by MOM).   escitalopram 5 MG tablet Commonly known as:  LEXAPRO Take 5 mg by mouth daily.   FLEET ENEMA RE Place 1 each rectally daily as needed (constipation not relieved by MOM or bisacodyl).   furosemide 80 MG tablet Commonly known as:  LASIX Take 80 mg by mouth daily.   insulin aspart 100 UNIT/ML FlexPen Commonly known as:  NOVOLOG Inject 0-15 Units into the skin 3 (three) times daily with meals. Inject 0-15 units subcutaneously three times daily before meals and at bedtime per sliding scale: CBG 70-120 0 units, 251-150 2 units, 151-200 3 units, 201-250 5 units, 251-300 8 units, 301-350 11 units, 351-400 15 units, >400 call MD and give 15 units   insulin glargine 100 UNIT/ML injection Commonly known as:  LANTUS Inject 0.1 mLs (10 Units total) into the skin at bedtime.   ipratropium-albuterol 0.5-2.5 (3) MG/3ML Soln Commonly known as:  DUONEB Take 3 mLs by nebulization every 6 (six) hours as needed.   lactulose 10 GM/15ML solution Commonly known as:  CHRONULAC Take 20 g by  mouth 3 (three) times daily.   magnesium oxide 400 MG tablet Commonly known as:  MAG-OX Take 400 mg by mouth 2 (two) times daily.   OXYGEN Inhale 2 L into the lungs continuous.   REFRESH OP Apply 1 drop to eye 2 (two) times daily.   rifaximin 550 MG Tabs tablet Commonly known as:  XIFAXAN Take 550 mg by mouth 2 (two) times daily.   sodium chloride 0.65 % Soln nasal spray Commonly known as:  OCEAN Place 2 sprays into both nostrils 2 (two) times daily as needed for congestion.   spironolactone 25 MG tablet Commonly known as:  ALDACTONE Take 50 mg by mouth daily.       No orders of the defined types were placed in this encounter.   Immunization History  Administered Date(s) Administered  . Influenza Split 04/09/2011, 03/27/2012, 03/27/2013  . Influenza, High Dose Seasonal PF 03/30/2017  .  Influenza,inj,Quad PF,6+ Mos 03/26/2015  . Influenza-Unspecified 04/24/2014, 04/23/2016, 03/30/2017  . Pneumococcal Conjugate-13 02/01/2014  . Pneumococcal Polysaccharide-23 04/09/2011  . Td 07/27/2010  . Tdap 05/08/2014    Social History   Tobacco Use  . Smoking status: Former Smoker    Packs/day: 0.25    Years: 58.00    Pack years: 14.50    Types: Cigarettes    Start date: 07/27/1964  . Smokeless tobacco: Never Used  Substance Use Topics  . Alcohol use: No    Alcohol/week: 0.0 oz    Review of Systems  DATA OBTAINED: from patient, nurse GENERAL:  no fevers, fatigue, appetite changes SKIN: Paretic HEENT: No complaint RESPIRATORY: No cough, +wheezing, +SOB CARDIAC: No chest pain-patient on pain meds now for chest pain, palpitations, lower extremity edema  GI: No abdominal pain, No N/V/D or constipation, No heartburn or reflux  GU: No dysuria, frequency or urgency, or incontinence  MUSCULOSKELETAL: No unrelieved bone/joint pain NEUROLOGIC: No headache, dizziness  PSYCHIATRIC: No overt anxiety or sadness  Vitals:   12/14/17 1226  BP: 90/72  Pulse: 88  Resp: 20    Temp: 98.3 F (36.8 C)  SpO2: 96%   Body mass index is 30.02 kg/m. Physical Exam  GENERAL APPEARANCE: Alert, conversant, + acute distress  SKIN: Paretic HEENT: Unremarkable RESPIRATORY: Breathing is labored. Lung sounds are wheezes CARDIOVASCULAR: Heart RRR 3/6 murmur, no  rubs or gallops. + peripheral edema,  GASTROINTESTINAL: Abdomen is soft, non-tender, not distended w/ normal bowel sounds.  GENITOURINARY: Bladder non tender, not distended  MUSCULOSKELETAL: No abnormal joints or musculature NEUROLOGIC: Cranial nerves 2-12 grossly intact. Moves all extremities PSYCHIATRIC: Mood and affect appropriate to situation, no behavioral issues  Patient Active Problem List   Diagnosis Date Noted  . Aspiration pneumonia ([place]) 12/11/2017  . Klebsiella cystitis 12/11/2017  . Depression 12/11/2017  . Hypomagnesemia 11/06/2017  . Hepatic encephalopathy ([place]) 10/30/2017  . Abnormal urinalysis 10/30/2017  . Right upper lobe pneumonia ([place]) 10/30/2017  . Acute respiratory failure with hypoxia and hypercapnia ([place]) 10/26/2017  . Acute pulmonary edema ([place]) 10/26/2017  . Acute metabolic encephalopathy 76/28/3151  . Liver cirrhosis secondary to NASH ([place]) 10/26/2017  . Sepsis ([place])   . Acute on chronic respiratory failure with hypoxia ([place]) 10/18/2017  . Bilateral lower extremity edema 10/02/2017  . Arthritis 10/02/2017  . COPD exacerbation ([place]) 06/25/2017  . Ventricular tachycardia ([place]) 05/22/2017  . Acute kidney injury ([place]) 05/17/2017  . Hyperkalemia 05/17/2017  . Vitamin D deficiency 05/17/2017  . COPD with acute exacerbation ([place]) 05/05/2017  . Urine frequency 04/13/2017  . Osteopenia 03/30/2017  . Dermatitis 03/30/2017  . Allergic state 11/10/2016  . 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 Portal hypertensive gastropathy ([place])   . Lower back injury, initial encounter 08/05/2016  . Fall 07/30/2016  . Preventative health care 03/08/2016  . Muscle spasm 02/25/2016  . Chronic  respiratory failure ([place]) 08/29/2015  . Type 2 diabetes mellitus with hyperglycemia, with long-term current use of insulin ([place])   . Cirrhosis of liver without ascites ([place]) 07/31/2015  . Acute respiratory failure with hypoxia ([place]) 07/31/2015  . Diarrhea 12/30/2014  . Encephalopathy, hepatic ([place]) 06/07/2014  . Right knee pain 06/07/2014  . Anxiety and depression 02/01/2014  . Tobacco abuse 02/01/2014  . Medicare annual wellness visit, subsequent 02/01/2014  . Overactive bladder 12/10/2013  . Abdominal aortic aneurysm ([place]) 10/01/2013  . Arthritis of right knee 10/01/2013  . Benign paroxysmal positional vertigo 10/01/2013  . Neck pain 10/01/2013  .  Esophageal reflux 10/01/2013  . Thrombocytopenia ([place]) 03/17/2012  . 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   . Hyperlipidemia, mixed   . Breast cancer ([place])     CMP     Component Value Date/Time   NA 138 12/14/2017   NA 140 10/15/2017   NA 141 07/07/2017 1102   NA 139 06/29/2016 1128   K 4.3 12/14/2017   K 4.8 10/15/2017   K 5.0 06/29/2016 1128   CL 105 11/04/2017 0445   CL 99 07/07/2017 1102   CO2 26 11/04/2017 0445   CO2 30 07/07/2017 1102   CO2 24 06/29/2016 1128   GLUCOSE 188 (H) 11/04/2017 0445   GLUCOSE 178 (H) 07/07/2017 1102   BUN 18 12/14/2017   BUN 22 07/07/2017 1102   BUN 15.9 06/29/2016 1128   CREATININE 0.6 12/14/2017   CREATININE 0.84 11/04/2017 0445   CREATININE 0.89 10/15/2017   CREATININE 1.1 06/29/2016 1128   CALCIUM 8.4 (L) 11/04/2017 0445   CALCIUM 8.5 10/15/2017   CALCIUM 9.0 06/29/2016 1128   PROT 4.9 (L) 10/31/2017 1038   PROT 5.1 10/15/2017   PROT 5.3 (L) 07/07/2017 1102   PROT 5.9 (L) 06/29/2016 1128   ALBUMIN 1.9 (L) 10/31/2017 1038   ALBUMIN 2.2 10/15/2017   ALBUMIN 2.7 (L) 06/29/2016 1128   AST 45 (H) 10/31/2017 1038   AST 28 10/15/2017   AST 36 (H) 06/29/2016 1128   ALT 38 10/31/2017 1038   ALT 19 10/15/2017   ALT 44 07/07/2017 1102   ALT 24  06/29/2016 1128   ALKPHOS 127 (H) 10/31/2017 1038   ALKPHOS 191 10/15/2017   ALKPHOS 129 06/29/2016 1128   BILITOT 5.2 (H) 10/31/2017 1038   BILITOT 1.2 10/15/2017   BILITOT 1.78 (H) 06/29/2016 1128   GFRNONAA >60 11/04/2017 0445   GFRAA >60 11/04/2017 0445   Recent Labs    05/08/17 0421  10/19/17 0905 10/20/17 0857  11/02/17 0707 11/03/17 0412 11/04/17 0445 12/14/17  NA 137   < > 138 133*   < > 143 138 139 138  K 4.2   < > 4.2 4.3   < > 3.9 4.4 4.0 4.3  CL 103   < > 104 99*   < > 112* 106 105  --   CO2 25   < > 26 23   < > 27 22 26   --   GLUCOSE 185*   < > 210* 305*   < > 175* 196* 188*  --   BUN 46*   < > 50* 59*   < > 20 16 16 18   CREATININE 1.03*   < > 1.34* 1.51*   < > 0.89 0.77 0.84 0.6  CALCIUM 8.4*   < > 8.6* 9.0   < > 8.4* 8.5* 8.4*  --   MG 2.0  --  2.4 2.3  --   --   --   --   --   PHOS  --   --  3.7 3.0  --   --   --   --   --    < > = values in this interval not displayed.   Recent Labs    10/15/17 10/18/17 0259 10/31/17 1038  AST 28 40 45*  ALT 19 22 38  ALKPHOS 191 187* 127*  BILITOT 1.2 3.8* 5.2*  PROT 5.1 5.7* 4.9*  ALBUMIN 2.2 2.3* 1.9*   Recent Labs    11/01/17 0327 11/02/17 0707 11/03/17 0412 12/14/17  WBC 9.4 8.9 8.9 8.3  NEUTROABS 6.8 6.0 6.5  --   HGB 10.6* 9.4* 10.3* 9.7*  HCT 32.9* 29.7* 32.1* 29*  MCV 94.8 95.8 95.0  --   PLT 105* 82* 85* 91*   Recent Labs    02/09/17 1222  CHOL 160  LDLCALC 96  TRIG 78.0   Lab Results  Component Value Date   MICROALBUR <0.7 02/25/2016   Lab Results  Component Value Date   TSH 1.16 11/10/2016   Lab Results  Component Value Date   HGBA1C 6.7 (H) 10/30/2017   Lab Results  Component Value Date   CHOL 160 02/09/2017   HDL 48.40 02/09/2017   LDLCALC 96 02/09/2017   TRIG 78.0 02/09/2017   CHOLHDL 3 02/09/2017    Significant Diagnostic Results in last 30 days:  No results found.  Assessment and Plan  Acute COPD exacerbation/respiratory failure with hypoxia and hypercarbia-  patient's labs from yesterday returned; patient's WBC is 8.3, hemoglobin 9.7, platelets 91 which is baseline for her; sodium 138 potassium 4.3, chloride 97, CO2 33, BUN 18.4, creatinine 0.64: Concerning is that patient CO2 is 33 which is not good in the face of the fact that her respiratory rate is 30; patient has had Solu-Medrol prednisone oxygen every 4 hours and as needed nebs; she is failed treatment out of the hospital-sending patient to the hospital   Time spent greater than 35 minutes Anne D. Sheppard Coil, MD

## 2017-12-14 NOTE — Progress Notes (Signed)
    PALLIATIVE CARE CONSULT VISIT   PATIENT NAME: Ashley Savage DOB: September 14, 1940 MRN: [id number]  PRIMARY CARE PROVIDER:   Hennie Duos, MD  REFERRING PROVIDER:      Hennie Duos, MD [place], [place] [address]  RESPONSIBLE PARTY:  Self and niece [name]   NOTE:  Arrivied at [facility] for routine follow-up.  Informed by staff that pt was transported to the ER this AM due to respiratory difficulty this AM.  She was recently dischraged from the hospital.  Phoned [name] (neice) to discuss need for re-addressing goals of care.Potential plan for transition to Hospice care when patient returns to facility.  Encouraged niece to request inpatient Palliative evaluation.  We will have additional discussion in the very near future.   Gonzella Lex, NP-C     Past Medical History:  Diagnosis Date  . Abdominal aortic aneurysm ([place]) 10/01/2013   Fall of 2014 3.3 per patient, follows with Vascular surgeon.   . Acute respiratory failure with hypoxia ([place]) 07/31/2015  . Allergic state 11/10/2016  . Anxiety   . Anxiety and depression 02/01/2014  . Arthritis of both knees 10/01/2013  . Arthritis of right knee 10/01/2013   Follows with Dr Mayer Camel   . Benign paroxysmal positional vertigo 10/01/2013  . Breast cancer ([place])    No disease activity On Femara Follows with Dr Marin Olp Right mastectomy performed by Dr Autumn Messing   . Cancer [facility]) breast ca  right  . Chronic respiratory failure ([place]) 08/29/2015  . Cirrhosis of liver without ascites ([place]) 07/31/2015  . COPD (chronic obstructive pulmonary disease) ([place]) 10/01/2013  . COPD with acute exacerbation ([place]) 05/05/2017  . Depression   . Dermatitis 03/30/2017  . Diabetes mellitus type 2  . Diabetes mellitus type 2, controlled ([place]) 10/16/2014  . Emphysema   . Encephalopathy, hepatic ([place]) 06/07/2014  . Esophageal reflux 10/01/2013  . Fall 07/30/2016  . Hyperlipidemia   . Hyperlipidemia, mixed   . Increased ammonia level 11/25/2014  . NASH  (nonalcoholic steatohepatitis) 08/26/2015  . Neck pain 10/01/2013  . Neuropathy    feet   . Osteopenia 03/30/2017  . Overactive bladder 12/10/2013  . Panic attacks   . Pedal edema 12/10/2013  . Personal history of radiation therapy   . Preventative health care 03/08/2016  . Thrombocytopenia ([place]) 03/17/2012  . Tobacco abuse disorder 02/01/2014  . Type 2 diabetes mellitus with hyperglycemia, with long-term current use of insulin ([place])   . Urine frequency 04/13/2017

## 2017-12-18 ENCOUNTER — Encounter: Payer: Self-pay | Admitting: Internal Medicine

## 2017-12-18 NOTE — Progress Notes (Signed)
Location:  [employer] [facility] [place] of Service:  SNF (31)  Hennie Duos, MD  Patient Care Team: Hennie Duos, MD as PCP - General (Internal Medicine) Dene Gentry, MD as Consulting Physician (Sports Medicine) Mauri Pole, MD as Consulting Physician (Gastroenterology) Rigoberto Noel, MD as Consulting Physician (Pulmonary Disease) Parrett, Fonnie Mu, NP as Nurse Practitioner (Pulmonary Disease) Volanda Napoleon, MD as Consulting Physician (Oncology)  Extended Emergency Contact Information Primary Emergency Contact: Lowella [place], [place] Phone: [phone] Mobile Phone: [phone] Relation: Friend Secondary Emergency Contact: Kathryne Hitch Address: [address]          [place], [place] [address] Johnnette Litter of [place] Phone: [phone] Mobile Phone: [phone] Relation: Friend    Allergies: Citalopram; Ciprofloxacin; Erythromycin; Glimepiride; and Prednisone  Chief Complaint  Patient presents with  . Acute Visit    HPI: Patient is 77 y.o. female who is being seen today because she has had a 6 pound weight gain over the last week.  Her weight is always a problem as she may be intravascularly deplete or normal but extra vascularly with lots of edema which she has.  Today her edema looks about the same 2+ but she does have rales at her bases patient admits that maybe she is a little bit more short of breath than usual.  Nursing report no cough no fever no change in mental status.  Past Medical History:  Diagnosis Date  . Abdominal aortic aneurysm ([place]) 10/01/2013   Fall of 2014 3.3 per patient, follows with Vascular surgeon.   . Acute respiratory failure with hypoxia ([place]) 07/31/2015  . Allergic state 11/10/2016  . Anxiety   . Anxiety and depression 02/01/2014  . Arthritis of both knees 10/01/2013  . Arthritis of right knee 10/01/2013   Follows with Dr Mayer Camel   . Benign paroxysmal positional vertigo  10/01/2013  . Breast cancer ([place])    No disease activity On Femara Follows with Dr Marin Olp Right mastectomy performed by Dr Autumn Messing   . Cancer [facility]) breast ca  right  . Chronic respiratory failure ([place]) 08/29/2015  . Cirrhosis of liver without ascites ([place]) 07/31/2015  . COPD (chronic obstructive pulmonary disease) ([place]) 10/01/2013  . COPD with acute exacerbation ([place]) 05/05/2017  . Depression   . Dermatitis 03/30/2017  . Diabetes mellitus type 2  . Diabetes mellitus type 2, controlled ([place]) 10/16/2014  . Emphysema   . Encephalopathy, hepatic ([place]) 06/07/2014  . Esophageal reflux 10/01/2013  . Fall 07/30/2016  . Hyperlipidemia   . Hyperlipidemia, mixed   . Increased ammonia level 11/25/2014  . NASH (nonalcoholic steatohepatitis) 08/26/2015  . Neck pain 10/01/2013  . Neuropathy    feet   . Osteopenia 03/30/2017  . Overactive bladder 12/10/2013  . Panic attacks   . Pedal edema 12/10/2013  . Personal history of radiation therapy   . Preventative health care 03/08/2016  . Thrombocytopenia ([place]) 03/17/2012  . Tobacco abuse disorder 02/01/2014  . Type 2 diabetes mellitus with hyperglycemia, with long-term current use of insulin ([place])   . Urine frequency 04/13/2017    Past Surgical History:  Procedure Laterality Date  . APPENDECTOMY  2007  . BREAST SURGERY  2009 right  . CATARACT EXTRACTION     x 2  . ESOPHAGOGASTRODUODENOSCOPY (EGD) WITH PROPOFOL N/A 08/14/2016   Procedure: ESOPHAGOGASTRODUODENOSCOPY (EGD) WITH PROPOFOL;  Surgeon: Mauri Pole, MD;  Location: [facility] ENDOSCOPY;  Service: Endoscopy;  Laterality: N/A;  . [place]  . KNEE SURGERY    . MANDIBLE FRACTURE SURGERY    . MASTECTOMY    . PILONIDAL CYST EXCISION    . TONSILLECTOMY      Allergies as of 11/19/2017      Reactions   Citalopram Palpitations   Irregular heart beat   Ciprofloxacin Other (See Comments)   Mental status change   Erythromycin Other (See Comments)   Stomach cramps   Glimepiride Other (See  Comments)   Elevated ammonia levels   Prednisone    Increased blood sugars too high      Medication List        Accurate as of 11/19/17 11:59 PM. Always use your most recent med list.          acetaminophen 325 MG tablet Commonly known as:  TYLENOL Take 650 mg by mouth every 6 (six) hours as needed (pain).   albuterol 0.63 MG/3ML nebulizer solution Commonly known as:  ACCUNEB Take 1 ampule by nebulization every 6 (six) hours as needed for shortness of breath.   BIOFREEZE 4 % Gel Generic drug:  Menthol (Topical Analgesic) Apply 1 application topically 2 (two) times daily. For arthritic knee pain   bisacodyl 10 MG suppository Commonly known as:  DULCOLAX Place 10 mg rectally daily as needed (constipation not relieved by MOM).   FLEET ENEMA RE Place 1 each rectally daily as needed (constipation not relieved by MOM or bisacodyl).   FLONASE ALLERGY RELIEF 50 MCG/ACT nasal spray Generic drug:  fluticasone Place 1 spray into both nostrils daily.   furosemide 80 MG tablet Commonly known as:  LASIX Take 80 mg by mouth daily.   insulin aspart 100 UNIT/ML FlexPen Commonly known as:  NOVOLOG Inject 0-15 Units into the skin 3 (three) times daily with meals. Inject 0-15 units subcutaneously three times daily before meals and at bedtime per sliding scale: CBG 70-120 0 units, 251-150 2 units, 151-200 3 units, 201-250 5 units, 251-300 8 units, 301-350 11 units, 351-400 15 units, >400 call MD and give 15 units   insulin glargine 100 UNIT/ML injection Commonly known as:  LANTUS Inject 0.1 mLs (10 Units total) into the skin at bedtime.   lactulose 10 GM/15ML solution Commonly known as:  CHRONULAC Take 45 mLs (30 g total) by mouth 3 (three) times daily.   magnesium oxide 400 MG tablet Commonly known as:  MAG-OX Take 400 mg by mouth 2 (two) times daily.   mometasone-formoterol 100-5 MCG/ACT Aero Commonly known as:  DULERA Inhale 2 puffs into the lungs 2 (two) times daily.     OXYGEN Inhale 2 L into the lungs continuous.   pantoprazole 20 MG tablet Commonly known as:  PROTONIX Take 20 mg by mouth daily.   rifaximin 550 MG Tabs tablet Commonly known as:  XIFAXAN Take 550 mg by mouth 2 (two) times daily.   sodium chloride 0.65 % Soln nasal spray Commonly known as:  OCEAN Place 2 sprays into both nostrils 2 (two) times daily as needed for congestion.   spironolactone 25 MG tablet Commonly known as:  ALDACTONE Take 50 mg by mouth daily.       No orders of the defined types were placed in this encounter.   Immunization History  Administered Date(s) Administered  . Influenza Split 04/09/2011, 03/27/2012, 03/27/2013  . Influenza, High Dose Seasonal PF 03/30/2017  . Influenza,inj,Quad PF,6+ Mos 03/26/2015  . Influenza-Unspecified 04/24/2014, 04/23/2016,  03/30/2017  . Pneumococcal Conjugate-13 02/01/2014  . Pneumococcal Polysaccharide-23 04/09/2011  . Td 07/27/2010  . Tdap 05/08/2014    Social History   Tobacco Use  . Smoking status: Former Smoker    Packs/day: 0.25    Years: 58.00    Pack years: 14.50    Types: Cigarettes    Start date: 07/27/1964  . Smokeless tobacco: Never Used  Substance Use Topics  . Alcohol use: No    Alcohol/week: 0.0 oz    Review of Systems  DATA OBTAINED: from patient, nurse GENERAL:  no fevers, fatigue, appetite changes SKIN: No itching, rash HEENT: No complaint RESPIRATORY: No cough, wheezing, SOB CARDIAC: No chest pain, palpitations, lower extremity edema  GI: No abdominal pain, No N/V/D or constipation, No heartburn or reflux  GU: No dysuria, frequency or urgency, or incontinence  MUSCULOSKELETAL: No unrelieved bone/joint pain NEUROLOGIC: No headache, dizziness  PSYCHIATRIC: No overt anxiety or sadness  Vitals:   12/18/17 1311  BP: 132/64  Pulse: 75  Resp: 18  Temp: 97.9 F (36.6 C)   Body mass index is 25.5 kg/m. Physical Exam  GENERAL APPEARANCE: Alert, conversant, No acute distress   SKIN: No diaphoresis rash HEENT: Unremarkable RESPIRATORY: Breathing is even, unlabored. Lung sounds are slight Rales at bases CARDIOVASCULAR: Heart RRR 3/6 systolic murmur, no rubs or gallops. 2+ peripheral edema  GASTROINTESTINAL: Abdomen is soft, non-tender, not distended w/ normal bowel sounds.  GENITOURINARY: Bladder non tender, not distended  MUSCULOSKELETAL: No abnormal joints or musculature NEUROLOGIC: Cranial nerves 2-12 grossly intact. Moves all extremities PSYCHIATRIC: Mood and affect appropriate to situation, no behavioral issues  Patient Active Problem List   Diagnosis Date Noted  . Aspiration pneumonia ([place]) 12/11/2017  . Klebsiella cystitis 12/11/2017  . Depression 12/11/2017  . Hypomagnesemia 11/06/2017  . Hepatic encephalopathy ([place]) 10/30/2017  . Abnormal urinalysis 10/30/2017  . Right upper lobe pneumonia ([place]) 10/30/2017  . Acute respiratory failure with hypoxia and hypercapnia ([place]) 10/26/2017  . Acute pulmonary edema ([place]) 10/26/2017  . Acute metabolic encephalopathy 25/36/6440  . Liver cirrhosis secondary to NASH ([place]) 10/26/2017  . Sepsis ([place])   . Acute on chronic respiratory failure with hypoxia ([place]) 10/18/2017  . Bilateral lower extremity edema 10/02/2017  . Arthritis 10/02/2017  . COPD exacerbation ([place]) 06/25/2017  . Ventricular tachycardia ([place]) 05/22/2017  . Acute kidney injury ([place]) 05/17/2017  . Hyperkalemia 05/17/2017  . Vitamin D deficiency 05/17/2017  . COPD with acute exacerbation ([place]) 05/05/2017  . Urine frequency 04/13/2017  . Osteopenia 03/30/2017  . Dermatitis 03/30/2017  . Allergic state 11/10/2016  . 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 Portal hypertensive gastropathy ([place])   . Lower back injury, initial encounter 08/05/2016  . Fall 07/30/2016  . Preventative health care 03/08/2016  . Muscle spasm 02/25/2016  . Chronic respiratory failure ([place]) 08/29/2015  . Type 2 diabetes mellitus with hyperglycemia, with long-term  current use of insulin ([place])   . Cirrhosis of liver without ascites ([place]) 07/31/2015  . Acute respiratory failure with hypoxia ([place]) 07/31/2015  . Diarrhea 12/30/2014  . Encephalopathy, hepatic ([place]) 06/07/2014  . Right knee pain 06/07/2014  . Anxiety and depression 02/01/2014  . Tobacco abuse 02/01/2014  . Medicare annual wellness visit, subsequent 02/01/2014  . Overactive bladder 12/10/2013  . Abdominal aortic aneurysm ([place]) 10/01/2013  . Arthritis of right knee 10/01/2013  . Benign paroxysmal positional vertigo 10/01/2013  . Neck pain 10/01/2013  . Esophageal reflux 10/01/2013  . Thrombocytopenia ([place]) 03/17/2012  . Obstructive chronic  bronchitis without exacerbation COPD gold stage C.   . Hyperlipidemia, mixed   . Breast cancer ([place])     CMP     Component Value Date/Time   NA 138 12/14/2017   NA 140 10/15/2017   NA 141 07/07/2017 1102   NA 139 06/29/2016 1128   K 4.3 12/14/2017   K 4.8 10/15/2017   K 5.0 06/29/2016 1128   CL 105 11/04/2017 0445   CL 99 07/07/2017 1102   CO2 26 11/04/2017 0445   CO2 30 07/07/2017 1102   CO2 24 06/29/2016 1128   GLUCOSE 188 (H) 11/04/2017 0445   GLUCOSE 178 (H) 07/07/2017 1102   BUN 18 12/14/2017   BUN 22 07/07/2017 1102   BUN 15.9 06/29/2016 1128   CREATININE 0.6 12/14/2017   CREATININE 0.84 11/04/2017 0445   CREATININE 0.89 10/15/2017   CREATININE 1.1 06/29/2016 1128   CALCIUM 8.4 (L) 11/04/2017 0445   CALCIUM 8.5 10/15/2017   CALCIUM 9.0 06/29/2016 1128   PROT 4.9 (L) 10/31/2017 1038   PROT 5.1 10/15/2017   PROT 5.3 (L) 07/07/2017 1102   PROT 5.9 (L) 06/29/2016 1128   ALBUMIN 1.9 (L) 10/31/2017 1038   ALBUMIN 2.2 10/15/2017   ALBUMIN 2.7 (L) 06/29/2016 1128   AST 45 (H) 10/31/2017 1038   AST 28 10/15/2017   AST 36 (H) 06/29/2016 1128   ALT 38 10/31/2017 1038   ALT 19 10/15/2017   ALT 44 07/07/2017 1102   ALT 24 06/29/2016 1128   ALKPHOS 127 (H) 10/31/2017 1038   ALKPHOS 191 10/15/2017   ALKPHOS 129 06/29/2016 1128    BILITOT 5.2 (H) 10/31/2017 1038   BILITOT 1.2 10/15/2017   BILITOT 1.78 (H) 06/29/2016 1128   GFRNONAA >60 11/04/2017 0445   GFRAA >60 11/04/2017 0445   Recent Labs    05/08/17 0421  10/19/17 0905 10/20/17 0857  11/02/17 0707 11/03/17 0412 11/04/17 0445 12/14/17  NA 137   < > 138 133*   < > 143 138 139 138  K 4.2   < > 4.2 4.3   < > 3.9 4.4 4.0 4.3  CL 103   < > 104 99*   < > 112* 106 105  --   CO2 25   < > 26 23   < > 27 22 26   --   GLUCOSE 185*   < > 210* 305*   < > 175* 196* 188*  --   BUN 46*   < > 50* 59*   < > 20 16 16 18   CREATININE 1.03*   < > 1.34* 1.51*   < > 0.89 0.77 0.84 0.6  CALCIUM 8.4*   < > 8.6* 9.0   < > 8.4* 8.5* 8.4*  --   MG 2.0  --  2.4 2.3  --   --   --   --   --   PHOS  --   --  3.7 3.0  --   --   --   --   --    < > = values in this interval not displayed.   Recent Labs    10/15/17 10/18/17 0259 10/31/17 1038  AST 28 40 45*  ALT 19 22 38  ALKPHOS 191 187* 127*  BILITOT 1.2 3.8* 5.2*  PROT 5.1 5.7* 4.9*  ALBUMIN 2.2 2.3* 1.9*   Recent Labs    11/01/17 0327 11/02/17 0707 11/03/17 0412 12/14/17  WBC 9.4 8.9 8.9 8.3  NEUTROABS 6.8 6.0 6.5  --  HGB 10.6* 9.4* 10.3* 9.7*  HCT 32.9* 29.7* 32.1* 29*  MCV 94.8 95.8 95.0  --   PLT 105* 82* 85* 91*   Recent Labs    02/09/17 1222  CHOL 160  LDLCALC 96  TRIG 78.0   Lab Results  Component Value Date   MICROALBUR <0.7 02/25/2016   Lab Results  Component Value Date   TSH 1.16 11/10/2016   Lab Results  Component Value Date   HGBA1C 6.7 (H) 10/30/2017   Lab Results  Component Value Date   CHOL 160 02/09/2017   HDL 48.40 02/09/2017   LDLCALC 96 02/09/2017   TRIG 78.0 02/09/2017   CHOLHDL 3 02/09/2017    Significant Diagnostic Results in last 30 days:  No results found.  Assessment and Plan  Weight gain with edema- patient already on a no salt diet; will increase Lasix to 120 mg daily for 4 days, with BMP after     Inocencio Homes, MD

## 2017-12-18 NOTE — Assessment & Plan Note (Signed)
Hemoglobin A1c 6.1; continue Lantus and NovoLog insulin

## 2017-12-18 NOTE — Assessment & Plan Note (Signed)
Asymptomatic; and  remain in the 90-100 range; will monitor at intervals

## 2017-12-18 NOTE — Assessment & Plan Note (Signed)
Mental status has remained clear, despite the fact that she occasionally misses her lactulose; she has continued to do well on her decrease lactulose since her last hospitalization; will continue lactulose 30 g 3 times daily

## 2017-12-27 ENCOUNTER — Non-Acute Institutional Stay: Payer: Self-pay | Admitting: Internal Medicine

## 2017-12-27 DIAGNOSIS — Z515 Encounter for palliative care: Secondary | ICD-10-CM

## 2017-12-27 MED ORDER — GENERIC EXTERNAL MEDICATION
250.00 | Status: DC
Start: 2017-12-27 — End: 2017-12-27

## 2017-12-27 MED ORDER — RIFAXIMIN 550 MG PO TABS
550.00 | ORAL_TABLET | ORAL | Status: DC
Start: 2017-12-27 — End: 2017-12-27

## 2017-12-27 MED ORDER — PREDNISONE 20 MG PO TABS
20.00 | ORAL_TABLET | ORAL | Status: DC
Start: 2017-12-28 — End: 2017-12-27

## 2017-12-27 MED ORDER — ESCITALOPRAM OXALATE 10 MG PO TABS
5.00 | ORAL_TABLET | ORAL | Status: DC
Start: 2017-12-28 — End: 2017-12-27

## 2017-12-27 MED ORDER — LACTULOSE 10 GM/15ML PO SOLN
20.00 g | ORAL | Status: DC
Start: ? — End: 2017-12-27

## 2017-12-27 MED ORDER — FAMOTIDINE 20 MG PO TABS
20.00 | ORAL_TABLET | ORAL | Status: DC
Start: 2017-12-27 — End: 2017-12-27

## 2017-12-27 MED ORDER — GENERIC EXTERNAL MEDICATION
10.00 | Status: DC
Start: 2017-12-27 — End: 2017-12-27

## 2017-12-27 MED ORDER — ENOXAPARIN SODIUM 30 MG/0.3ML ~~LOC~~ SOLN
30.00 | SUBCUTANEOUS | Status: DC
Start: 2017-12-27 — End: 2017-12-27

## 2017-12-27 MED ORDER — ACETAZOLAMIDE 250 MG PO TABS
250.00 | ORAL_TABLET | ORAL | Status: DC
Start: 2017-12-27 — End: 2017-12-27

## 2017-12-27 MED ORDER — UMECLIDINIUM-VILANTEROL 62.5-25 MCG/INH IN AEPB
1.00 | INHALATION_SPRAY | RESPIRATORY_TRACT | Status: DC
Start: 2017-12-28 — End: 2017-12-27

## 2017-12-27 MED ORDER — GENERIC EXTERNAL MEDICATION
1.00 | Status: DC
Start: ? — End: 2017-12-27

## 2017-12-27 MED ORDER — POLYVINYL ALCOHOL 1.4 % OP SOLN
1.00 | OPHTHALMIC | Status: DC
Start: ? — End: 2017-12-27

## 2017-12-27 MED ORDER — ALBUTEROL SULFATE (5 MG/ML) 0.5% IN NEBU
2.50 | INHALATION_SOLUTION | RESPIRATORY_TRACT | Status: DC
Start: ? — End: 2017-12-27

## 2017-12-27 MED ORDER — IPRATROPIUM BROMIDE 0.02 % IN SOLN
0.50 | RESPIRATORY_TRACT | Status: DC
Start: ? — End: 2017-12-27

## 2017-12-27 MED ORDER — ACETAMINOPHEN 325 MG PO TABS
650.00 | ORAL_TABLET | ORAL | Status: DC
Start: ? — End: 2017-12-27

## 2017-12-27 NOTE — Progress Notes (Signed)
    PALLIATIVE CARE CONSULT VISIT   PATIENT NAME: Ashley Savage DOB: 03-12-1941 MRN: [id number]  PRIMARY CARE PROVIDER:   Hennie Duos, MD   NOTE:  Arrived at [facility] for a follow-up visit. Staff reports that patient remains hospitalized since 12/14/17.  I will follow-up after her return to the facility.    Gonzella Lex, NP

## 2018-01-24 DEATH — deceased

## 2019-05-19 IMAGING — CR DG CHEST 2V
2 series · 2 of 2 positions shown · non-contrast
Comparison: Chest radiograph dated 08/30/2017

CLINICAL DATA: 76-year-old female with shortness of breath,
hypoxia, and wheezing.

EXAM:
CHEST - 2 VIEW

[w chest lat]
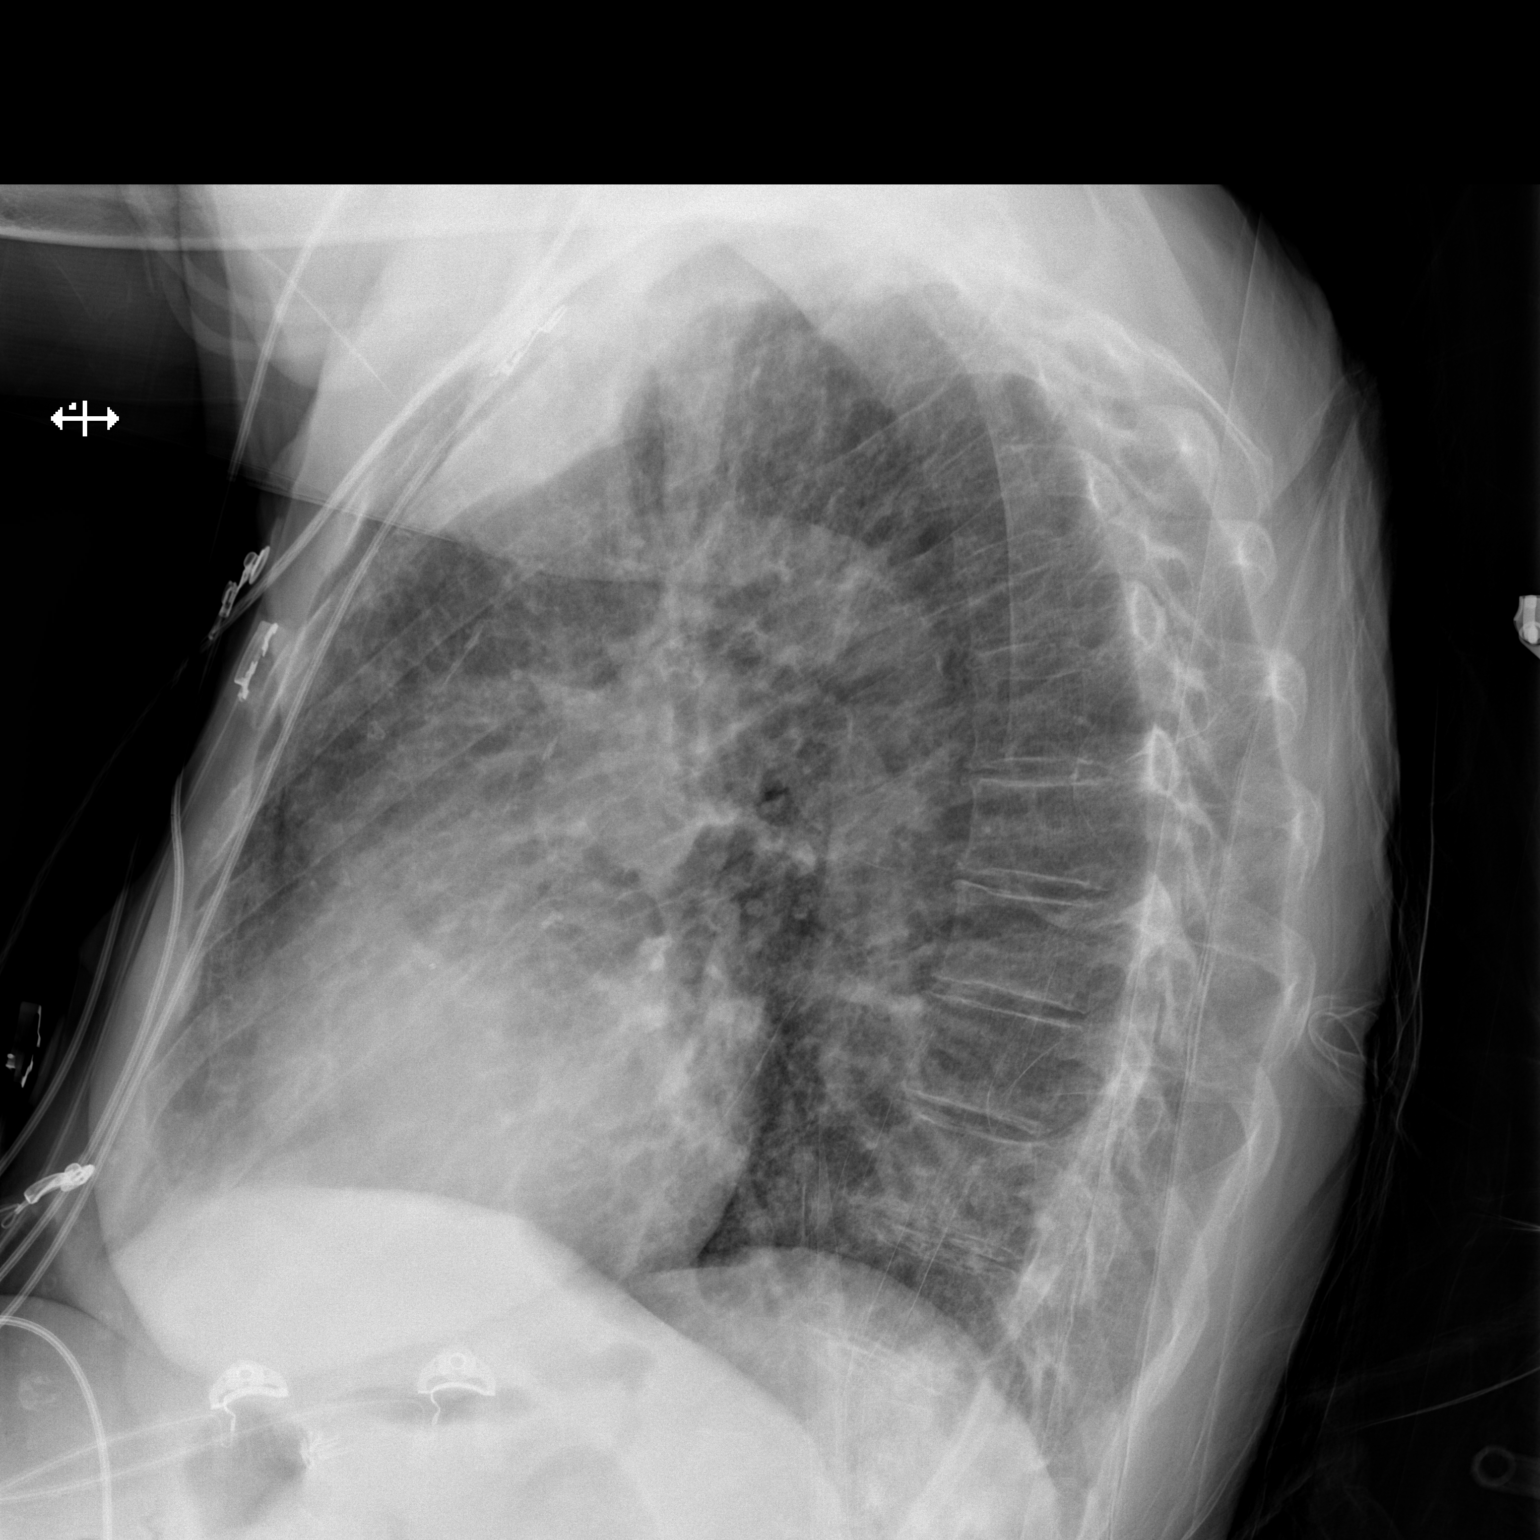

[x chest ap]
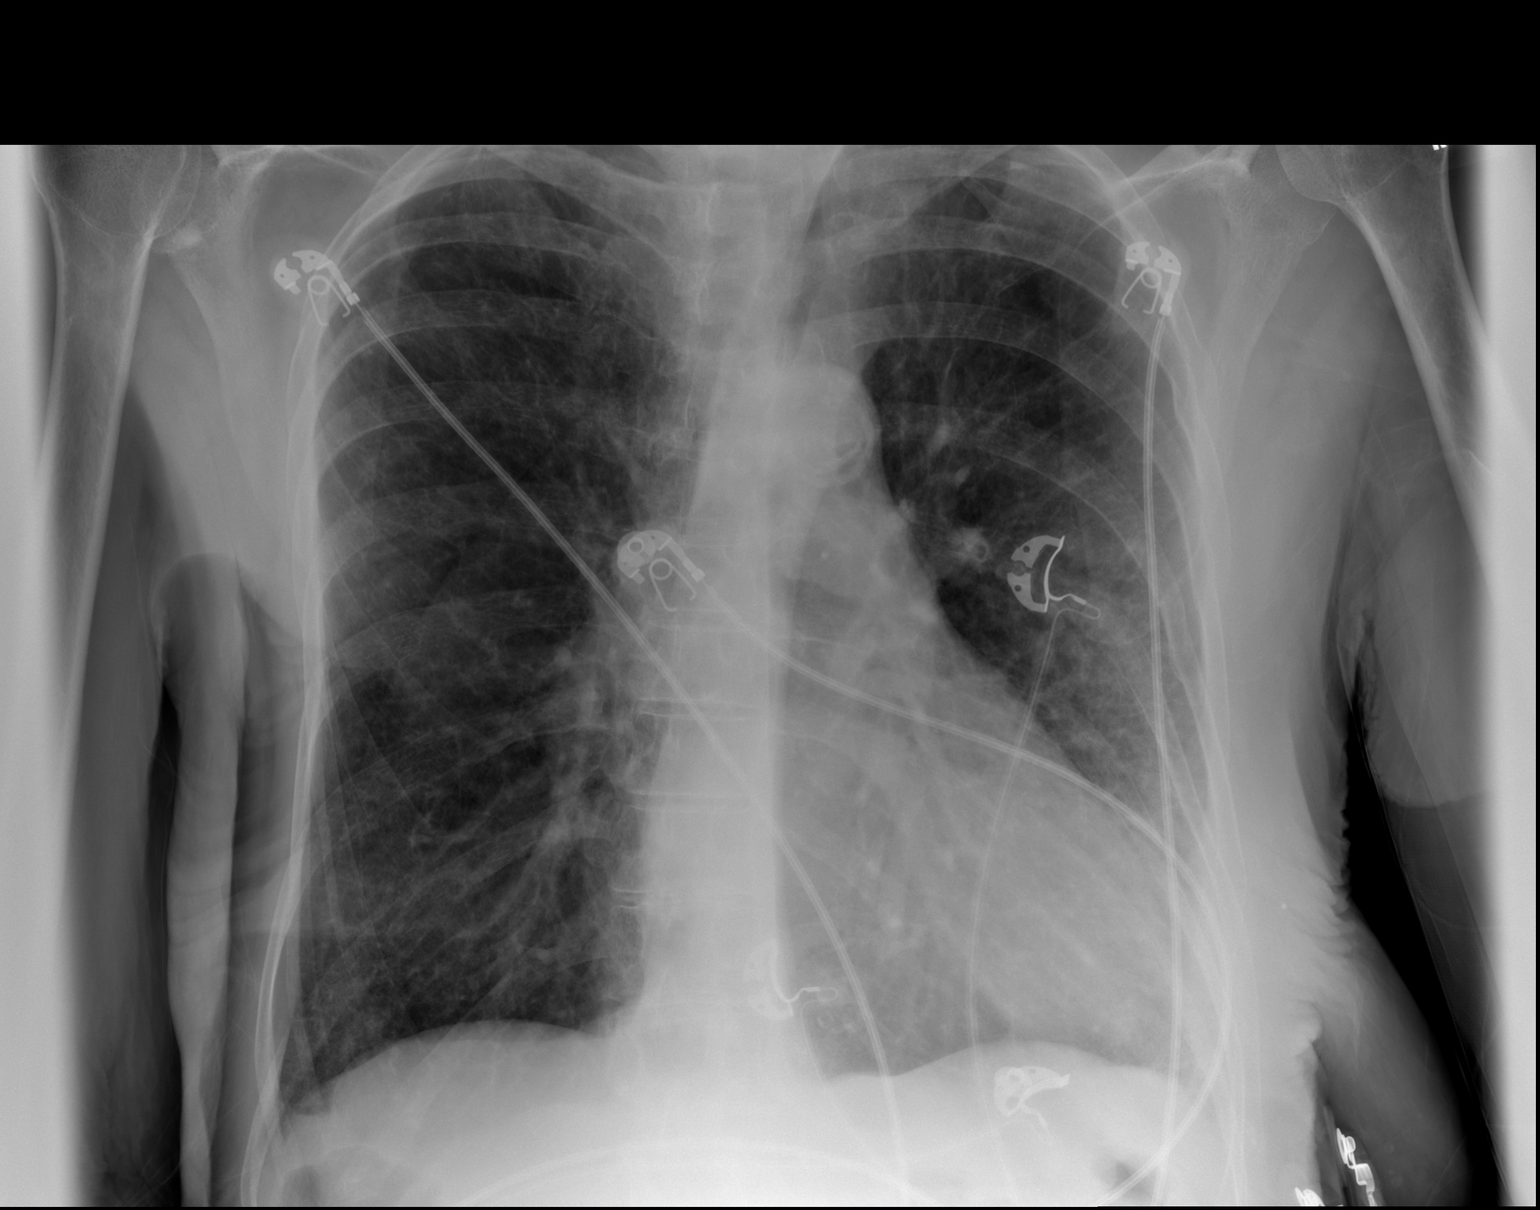

[2 of 2 positions shown; findings below may reference images not displayed]

FINDINGS: Emphysema with chronic interstitial coarsening. No focal
consolidation, pleural effusion, or pneumothorax. Mild cardiomegaly.
Atherosclerotic calcification of the aortic arch. No acute osseous
pathology.
IMPRESSION: 1. No acute cardiopulmonary process.
2. Emphysema.
